# Patient Record
Sex: Female | Born: 1935 | Race: White | Hispanic: No | Smoking: Never smoker
Health system: Southern US, Community
[De-identification: ages and names within clinical notes are randomized; demographics above are authoritative.]

## PROBLEM LIST (undated history)

## (undated) DIAGNOSIS — K649 Unspecified hemorrhoids: Secondary | ICD-10-CM

## (undated) DIAGNOSIS — M810 Age-related osteoporosis without current pathological fracture: Secondary | ICD-10-CM

## (undated) DIAGNOSIS — M1990 Unspecified osteoarthritis, unspecified site: Secondary | ICD-10-CM

## (undated) DIAGNOSIS — T4145XA Adverse effect of unspecified anesthetic, initial encounter: Secondary | ICD-10-CM

## (undated) DIAGNOSIS — R609 Edema, unspecified: Secondary | ICD-10-CM

## (undated) DIAGNOSIS — R319 Hematuria, unspecified: Secondary | ICD-10-CM

## (undated) DIAGNOSIS — C50412 Malignant neoplasm of upper-outer quadrant of left female breast: Secondary | ICD-10-CM

## (undated) DIAGNOSIS — R059 Cough, unspecified: Secondary | ICD-10-CM

## (undated) DIAGNOSIS — F419 Anxiety disorder, unspecified: Secondary | ICD-10-CM

## (undated) DIAGNOSIS — M791 Myalgia, unspecified site: Secondary | ICD-10-CM

## (undated) DIAGNOSIS — K219 Gastro-esophageal reflux disease without esophagitis: Secondary | ICD-10-CM

## (undated) DIAGNOSIS — E785 Hyperlipidemia, unspecified: Secondary | ICD-10-CM

## (undated) DIAGNOSIS — R251 Tremor, unspecified: Secondary | ICD-10-CM

## (undated) DIAGNOSIS — R42 Dizziness and giddiness: Secondary | ICD-10-CM

## (undated) DIAGNOSIS — C189 Malignant neoplasm of colon, unspecified: Secondary | ICD-10-CM

## (undated) DIAGNOSIS — N63 Unspecified lump in unspecified breast: Secondary | ICD-10-CM

## (undated) DIAGNOSIS — E119 Type 2 diabetes mellitus without complications: Secondary | ICD-10-CM

## (undated) DIAGNOSIS — A419 Sepsis, unspecified organism: Secondary | ICD-10-CM

## (undated) DIAGNOSIS — T8859XA Other complications of anesthesia, initial encounter: Secondary | ICD-10-CM

## (undated) DIAGNOSIS — F410 Panic disorder [episodic paroxysmal anxiety] without agoraphobia: Secondary | ICD-10-CM

## (undated) DIAGNOSIS — C419 Malignant neoplasm of bone and articular cartilage, unspecified: Secondary | ICD-10-CM

## (undated) DIAGNOSIS — N189 Chronic kidney disease, unspecified: Secondary | ICD-10-CM

## (undated) DIAGNOSIS — Z923 Personal history of irradiation: Secondary | ICD-10-CM

## (undated) DIAGNOSIS — I10 Essential (primary) hypertension: Secondary | ICD-10-CM

## (undated) DIAGNOSIS — D689 Coagulation defect, unspecified: Secondary | ICD-10-CM

## (undated) DIAGNOSIS — Z86718 Personal history of other venous thrombosis and embolism: Secondary | ICD-10-CM

## (undated) DIAGNOSIS — C4490 Unspecified malignant neoplasm of skin, unspecified: Secondary | ICD-10-CM

## (undated) DIAGNOSIS — M542 Cervicalgia: Secondary | ICD-10-CM

## (undated) DIAGNOSIS — F32A Depression, unspecified: Secondary | ICD-10-CM

## (undated) DIAGNOSIS — IMO0001 Reserved for inherently not codable concepts without codable children: Secondary | ICD-10-CM

## (undated) DIAGNOSIS — F329 Major depressive disorder, single episode, unspecified: Secondary | ICD-10-CM

## (undated) DIAGNOSIS — R05 Cough: Secondary | ICD-10-CM

## (undated) DIAGNOSIS — R0982 Postnasal drip: Secondary | ICD-10-CM

## (undated) DIAGNOSIS — K5792 Diverticulitis of intestine, part unspecified, without perforation or abscess without bleeding: Secondary | ICD-10-CM

## (undated) HISTORY — DX: Malignant neoplasm of upper-outer quadrant of left female breast: C50.412

## (undated) HISTORY — DX: Reserved for inherently not codable concepts without codable children: IMO0001

## (undated) HISTORY — DX: Age-related osteoporosis without current pathological fracture: M81.0

## (undated) HISTORY — DX: Cervicalgia: M54.2

## (undated) HISTORY — DX: Diverticulitis of intestine, part unspecified, without perforation or abscess without bleeding: K57.92

## (undated) HISTORY — DX: Anxiety disorder, unspecified: F41.9

## (undated) HISTORY — PX: BREAST BIOPSY: SHX20

## (undated) HISTORY — DX: Sepsis, unspecified organism: A41.9

## (undated) HISTORY — DX: Essential (primary) hypertension: I10

## (undated) HISTORY — DX: Dizziness and giddiness: R42

## (undated) HISTORY — DX: Myalgia, unspecified site: M79.10

## (undated) HISTORY — DX: Coagulation defect, unspecified: D68.9

## (undated) HISTORY — DX: Edema, unspecified: R60.9

## (undated) HISTORY — DX: Panic disorder (episodic paroxysmal anxiety): F41.0

## (undated) HISTORY — PX: OTHER SURGICAL HISTORY: SHX169

## (undated) HISTORY — PX: SKIN CANCER EXCISION: SHX779

## (undated) HISTORY — DX: Depression, unspecified: F32.A

## (undated) HISTORY — DX: Hyperlipidemia, unspecified: E78.5

## (undated) HISTORY — DX: Unspecified malignant neoplasm of skin, unspecified: C44.90

## (undated) HISTORY — DX: Gastro-esophageal reflux disease without esophagitis: K21.9

## (undated) HISTORY — DX: Malignant neoplasm of colon, unspecified: C18.9

## (undated) HISTORY — DX: Unspecified hemorrhoids: K64.9

## (undated) HISTORY — DX: Unspecified lump in unspecified breast: N63.0

## (undated) HISTORY — DX: Type 2 diabetes mellitus without complications: E11.9

## (undated) HISTORY — DX: Unspecified osteoarthritis, unspecified site: M19.90

## (undated) HISTORY — PX: LEG SURGERY: SHX1003

## (undated) HISTORY — DX: Hematuria, unspecified: R31.9

## (undated) HISTORY — PX: OVARY SURGERY: SHX727

## (undated) HISTORY — PX: CARPAL TUNNEL RELEASE: SHX101

## (undated) HISTORY — PX: TONSILLECTOMY: SUR1361

## (undated) HISTORY — DX: Major depressive disorder, single episode, unspecified: F32.9

## (undated) HISTORY — PX: CHOLECYSTECTOMY: SHX55

---

## 1960-05-05 HISTORY — PX: OTHER SURGICAL HISTORY: SHX169

## 1960-05-05 HISTORY — PX: APPENDECTOMY: SHX54

## 1966-05-05 HISTORY — PX: GALLBLADDER SURGERY: SHX652

## 2007-08-17 ENCOUNTER — Ambulatory Visit: Payer: Self-pay | Admitting: Family Medicine

## 2007-10-06 ENCOUNTER — Ambulatory Visit: Payer: Self-pay | Admitting: Unknown Physician Specialty

## 2008-10-06 ENCOUNTER — Ambulatory Visit: Payer: Self-pay

## 2009-07-30 ENCOUNTER — Ambulatory Visit: Payer: Self-pay | Admitting: Internal Medicine

## 2009-08-13 ENCOUNTER — Ambulatory Visit: Payer: Self-pay | Admitting: Internal Medicine

## 2009-09-20 ENCOUNTER — Ambulatory Visit: Payer: Self-pay | Admitting: Internal Medicine

## 2010-03-15 ENCOUNTER — Ambulatory Visit: Payer: Self-pay | Admitting: Family Medicine

## 2010-05-05 HISTORY — PX: EYE SURGERY: SHX253

## 2010-07-31 ENCOUNTER — Ambulatory Visit: Payer: Self-pay | Admitting: Family Medicine

## 2010-08-07 ENCOUNTER — Ambulatory Visit: Payer: Self-pay | Admitting: Family Medicine

## 2010-11-18 ENCOUNTER — Ambulatory Visit: Payer: Self-pay | Admitting: Family Medicine

## 2011-09-23 ENCOUNTER — Ambulatory Visit: Payer: Self-pay | Admitting: Family Medicine

## 2012-10-19 ENCOUNTER — Ambulatory Visit: Payer: Self-pay | Admitting: Family Medicine

## 2013-02-21 ENCOUNTER — Ambulatory Visit (INDEPENDENT_AMBULATORY_CARE_PROVIDER_SITE_OTHER): Payer: Medicare Other | Admitting: Podiatry

## 2013-02-21 ENCOUNTER — Encounter: Payer: Self-pay | Admitting: Podiatry

## 2013-02-21 VITALS — BP 106/54 | HR 80 | Resp 16 | Ht 62.0 in | Wt 162.0 lb

## 2013-02-21 DIAGNOSIS — B351 Tinea unguium: Secondary | ICD-10-CM

## 2013-02-21 DIAGNOSIS — M79609 Pain in unspecified limb: Secondary | ICD-10-CM

## 2013-02-21 NOTE — Progress Notes (Signed)
Julie Acosta presents today as a 79 year old white female with a chief complaint of painful toenails bilaterally.  Objective: Vital signs are stable she is alert and oriented x3. Pulses are palpable. Nails are thick yellow dystrophic onychomycotic and painful palpation.  Assessment: Pain in limb secondary to onychomycosis.  Plan: Debridement of nails is a covered service 1 through 5 bilateral.

## 2013-05-23 ENCOUNTER — Ambulatory Visit (INDEPENDENT_AMBULATORY_CARE_PROVIDER_SITE_OTHER): Payer: Medicare Other | Admitting: Podiatry

## 2013-05-23 ENCOUNTER — Encounter: Payer: Self-pay | Admitting: Podiatry

## 2013-05-23 VITALS — BP 106/47 | HR 85 | Resp 16 | Ht 62.0 in | Wt 162.0 lb

## 2013-05-23 DIAGNOSIS — B351 Tinea unguium: Secondary | ICD-10-CM

## 2013-05-23 DIAGNOSIS — M79609 Pain in unspecified limb: Secondary | ICD-10-CM

## 2013-05-23 NOTE — Progress Notes (Signed)
Julie Acosta presents today with a chief complaint of painful elongated toenails to the right foot.  Objective: Vital signs are stable she is alert and oriented x3. Pain on palpation to toes one through 5 of the right foot.  Assessment: Pain in limb secondary onychomycosis 1 through 5 of the right foot.  Plan: Debridement nails 1 through 5 bilateral covered service secondary to pain.

## 2013-07-01 ENCOUNTER — Emergency Department: Payer: Self-pay | Admitting: Emergency Medicine

## 2013-07-01 LAB — BASIC METABOLIC PANEL
Anion Gap: 8 (ref 7–16)
BUN: 21 mg/dL — AB (ref 7–18)
CALCIUM: 11.2 mg/dL — AB (ref 8.5–10.1)
Chloride: 108 mmol/L — ABNORMAL HIGH (ref 98–107)
Co2: 23 mmol/L (ref 21–32)
Creatinine: 0.95 mg/dL (ref 0.60–1.30)
EGFR (African American): >60
GFR CALC NON AF AMER: 58 — AB
GLUCOSE: 150 mg/dL — AB (ref 65–99)
Osmolality: 283 (ref 275–301)
Potassium: 4.6 mmol/L (ref 3.5–5.1)
Sodium: 139 mmol/L (ref 136–145)

## 2013-07-01 LAB — HEPATIC FUNCTION PANEL A (ARMC)
ALBUMIN: 3.6 g/dL (ref 3.4–5.0)
ALK PHOS: 104 U/L
AST: 16 U/L (ref 15–37)
BILIRUBIN TOTAL: 0.2 mg/dL (ref 0.2–1.0)
Bilirubin, Direct: <0.1 mg/dL (ref 0.00–0.20)
SGPT (ALT): 19 U/L (ref 12–78)
Total Protein: 7.4 g/dL (ref 6.4–8.2)

## 2013-07-01 LAB — CBC
HCT: 39.6 % (ref 35.0–47.0)
HGB: 12.9 g/dL (ref 12.0–16.0)
MCH: 27.1 pg (ref 26.0–34.0)
MCHC: 32.5 g/dL (ref 32.0–36.0)
MCV: 83 fL (ref 80–100)
Platelet: 228 10*3/uL (ref 150–440)
RBC: 4.75 10*6/uL (ref 3.80–5.20)
RDW: 15.9 % — AB (ref 11.5–14.5)
WBC: 12.1 10*3/uL — ABNORMAL HIGH (ref 3.6–11.0)

## 2013-08-22 ENCOUNTER — Ambulatory Visit (INDEPENDENT_AMBULATORY_CARE_PROVIDER_SITE_OTHER): Payer: Medicare Other | Admitting: Podiatry

## 2013-08-22 ENCOUNTER — Encounter: Payer: Self-pay | Admitting: Podiatry

## 2013-08-22 VITALS — BP 115/57 | HR 95 | Resp 16

## 2013-08-22 DIAGNOSIS — M79609 Pain in unspecified limb: Secondary | ICD-10-CM

## 2013-08-22 DIAGNOSIS — B351 Tinea unguium: Secondary | ICD-10-CM

## 2013-08-22 NOTE — Progress Notes (Signed)
Julie Acosta presents today chief complaint of painful toenails one through 5 of the right foot. She also states that she has recently had a blood clot in her left arm and is notable at the in had shingles.  Objective: Pulses are palpable to the right foot. Nails are thick and elongated.  Assessment: Pain in limb secondary to elongated toenails.  Plan: Debridement of nails 1 through 5 of the right foot only.

## 2013-11-21 ENCOUNTER — Ambulatory Visit: Payer: Medicare Other | Admitting: Podiatry

## 2013-11-21 ENCOUNTER — Ambulatory Visit (INDEPENDENT_AMBULATORY_CARE_PROVIDER_SITE_OTHER): Payer: Medicare Other | Admitting: Podiatry

## 2013-11-21 VITALS — BP 114/56 | HR 76 | Resp 16

## 2013-11-21 DIAGNOSIS — M79676 Pain in unspecified toe(s): Secondary | ICD-10-CM

## 2013-11-21 DIAGNOSIS — B351 Tinea unguium: Secondary | ICD-10-CM

## 2013-11-21 DIAGNOSIS — M79609 Pain in unspecified limb: Secondary | ICD-10-CM

## 2013-11-21 NOTE — Progress Notes (Signed)
She presents today with a chief complaint of pain to her right foot and elongated toenails.  Objective: Pulses are palpable right foot. Nails are thick yellow and mycotic.  Assessment: Pain in limb secondary to onychomycosis.  Plan: Debridement of nails in thickness and length as cover service.

## 2014-01-03 ENCOUNTER — Ambulatory Visit: Payer: Self-pay | Admitting: Family Medicine

## 2014-02-27 ENCOUNTER — Ambulatory Visit (INDEPENDENT_AMBULATORY_CARE_PROVIDER_SITE_OTHER): Payer: Medicare Other | Admitting: Podiatry

## 2014-02-27 DIAGNOSIS — B351 Tinea unguium: Secondary | ICD-10-CM

## 2014-02-27 DIAGNOSIS — M79676 Pain in unspecified toe(s): Secondary | ICD-10-CM

## 2014-02-27 NOTE — Progress Notes (Signed)
She presents today with a chief complaint of painful elongated toenails 1 through 5 of the right foot.  Objective: Pulses remain palpable right foot. Nails are thick yellow dystrophic with mycotic and painful palpation 1 through 5 of the right foot.  Assessment: Pain limb secondary one through 5 right foot.  Plan: Debridement of nails 1 through 5 right foot. 

## 2014-05-09 ENCOUNTER — Ambulatory Visit: Payer: Self-pay

## 2014-05-31 ENCOUNTER — Ambulatory Visit (INDEPENDENT_AMBULATORY_CARE_PROVIDER_SITE_OTHER): Payer: Medicare Other | Admitting: Podiatry

## 2014-05-31 ENCOUNTER — Encounter: Payer: Self-pay | Admitting: Podiatry

## 2014-05-31 DIAGNOSIS — B351 Tinea unguium: Secondary | ICD-10-CM

## 2014-05-31 DIAGNOSIS — M79676 Pain in unspecified toe(s): Secondary | ICD-10-CM

## 2014-05-31 NOTE — Progress Notes (Signed)
She presents today with a chief complaint of painful elongated toenails 1 through 5 of the right foot.  Objective: Pulses remain palpable right foot. Nails are thick yellow dystrophic with mycotic and painful palpation 1 through 5 of the right foot.  Assessment: Pain limb secondary one through 5 right foot.  Plan: Debridement of nails 1 through 5 right foot.

## 2014-08-14 ENCOUNTER — Ambulatory Visit: Payer: Medicare Other

## 2014-08-16 ENCOUNTER — Ambulatory Visit (INDEPENDENT_AMBULATORY_CARE_PROVIDER_SITE_OTHER): Payer: Medicare Other | Admitting: Podiatry

## 2014-08-16 DIAGNOSIS — B351 Tinea unguium: Secondary | ICD-10-CM | POA: Diagnosis not present

## 2014-08-16 DIAGNOSIS — M79676 Pain in unspecified toe(s): Secondary | ICD-10-CM

## 2014-08-16 NOTE — Progress Notes (Signed)
She presents today with a chief complaint of painful elongated toenails 1 through 5 of the right foot.  Objective: Pulses remain palpable right foot. Nails are thick yellow dystrophic with mycotic and painful palpation 1 through 5 of the right foot.  Assessment: Pain limb secondary one through 5 right foot.  Plan: Debridement of nails 1 through 5 right foot.

## 2014-11-22 ENCOUNTER — Ambulatory Visit
Admission: RE | Admit: 2014-11-22 | Discharge: 2014-11-22 | Disposition: A | Discharge status: Home / Self Care | Payer: Medicare Other | Source: Ambulatory Visit | Attending: Unknown Physician Specialty | Admitting: Unknown Physician Specialty

## 2014-11-22 ENCOUNTER — Encounter
Admission: RE | Disposition: A | Discharge status: Home / Self Care | Payer: Self-pay | Source: Ambulatory Visit | Attending: Unknown Physician Specialty

## 2014-11-22 ENCOUNTER — Ambulatory Visit: Payer: Medicare Other | Admitting: Anesthesiology

## 2014-11-22 ENCOUNTER — Encounter: Payer: Self-pay | Admitting: Anesthesiology

## 2014-11-22 DIAGNOSIS — Z85038 Personal history of other malignant neoplasm of large intestine: Secondary | ICD-10-CM | POA: Insufficient documentation

## 2014-11-22 DIAGNOSIS — Z85828 Personal history of other malignant neoplasm of skin: Secondary | ICD-10-CM | POA: Diagnosis not present

## 2014-11-22 DIAGNOSIS — K640 First degree hemorrhoids: Secondary | ICD-10-CM | POA: Insufficient documentation

## 2014-11-22 DIAGNOSIS — I10 Essential (primary) hypertension: Secondary | ICD-10-CM | POA: Diagnosis not present

## 2014-11-22 DIAGNOSIS — D689 Coagulation defect, unspecified: Secondary | ICD-10-CM | POA: Insufficient documentation

## 2014-11-22 DIAGNOSIS — Z89612 Acquired absence of left leg above knee: Secondary | ICD-10-CM | POA: Insufficient documentation

## 2014-11-22 DIAGNOSIS — D120 Benign neoplasm of cecum: Secondary | ICD-10-CM | POA: Insufficient documentation

## 2014-11-22 DIAGNOSIS — Z885 Allergy status to narcotic agent status: Secondary | ICD-10-CM | POA: Diagnosis not present

## 2014-11-22 DIAGNOSIS — K219 Gastro-esophageal reflux disease without esophagitis: Secondary | ICD-10-CM | POA: Insufficient documentation

## 2014-11-22 DIAGNOSIS — K5730 Diverticulosis of large intestine without perforation or abscess without bleeding: Secondary | ICD-10-CM | POA: Insufficient documentation

## 2014-11-22 DIAGNOSIS — K2970 Gastritis, unspecified, without bleeding: Secondary | ICD-10-CM | POA: Diagnosis not present

## 2014-11-22 DIAGNOSIS — K317 Polyp of stomach and duodenum: Secondary | ICD-10-CM | POA: Diagnosis not present

## 2014-11-22 DIAGNOSIS — R1310 Dysphagia, unspecified: Secondary | ICD-10-CM | POA: Diagnosis present

## 2014-11-22 DIAGNOSIS — F419 Anxiety disorder, unspecified: Secondary | ICD-10-CM | POA: Insufficient documentation

## 2014-11-22 DIAGNOSIS — E119 Type 2 diabetes mellitus without complications: Secondary | ICD-10-CM | POA: Insufficient documentation

## 2014-11-22 DIAGNOSIS — F329 Major depressive disorder, single episode, unspecified: Secondary | ICD-10-CM | POA: Insufficient documentation

## 2014-11-22 DIAGNOSIS — Z79899 Other long term (current) drug therapy: Secondary | ICD-10-CM | POA: Insufficient documentation

## 2014-11-22 DIAGNOSIS — Z9049 Acquired absence of other specified parts of digestive tract: Secondary | ICD-10-CM | POA: Insufficient documentation

## 2014-11-22 DIAGNOSIS — M1990 Unspecified osteoarthritis, unspecified site: Secondary | ICD-10-CM | POA: Insufficient documentation

## 2014-11-22 HISTORY — PX: SAVORY DILATION: SHX5439

## 2014-11-22 HISTORY — DX: Gastro-esophageal reflux disease without esophagitis: K21.9

## 2014-11-22 HISTORY — PX: ESOPHAGOGASTRODUODENOSCOPY: SHX5428

## 2014-11-22 HISTORY — PX: COLONOSCOPY WITH PROPOFOL: SHX5780

## 2014-11-22 LAB — GLUCOSE, CAPILLARY: Glucose-Capillary: 164 mg/dL — ABNORMAL HIGH (ref 65–99)

## 2014-11-22 SURGERY — COLONOSCOPY WITH PROPOFOL
Anesthesia: General

## 2014-11-22 MED ORDER — SODIUM CHLORIDE 0.9 % IV SOLN: INTRAVENOUS | Status: DC

## 2014-11-22 MED ORDER — PROPOFOL INFUSION 10 MG/ML OPTIME
INTRAVENOUS | Status: DC | PRN
  Administered 2014-11-22: 100 ug/kg/min via INTRAVENOUS

## 2014-11-22 MED ORDER — FENTANYL CITRATE (PF) 100 MCG/2ML IJ SOLN
INTRAMUSCULAR | Status: DC | PRN
  Administered 2014-11-22: 50 ug via INTRAVENOUS

## 2014-11-22 MED ORDER — PROPOFOL 10 MG/ML IV BOLUS
INTRAVENOUS | Status: DC | PRN
  Administered 2014-11-22: 10 mg via INTRAVENOUS
  Administered 2014-11-22: 30 mg via INTRAVENOUS

## 2014-11-22 MED ORDER — SODIUM CHLORIDE 0.9 % IV SOLN
INTRAVENOUS | Status: DC
  Administered 2014-11-22: 1000 mL via INTRAVENOUS

## 2014-11-22 MED ORDER — LIDOCAINE HCL (PF) 2 % IJ SOLN
INTRAMUSCULAR | Status: DC | PRN
  Administered 2014-11-22: 60 mg

## 2014-11-22 NOTE — Anesthesia Postprocedure Evaluation (Signed)
  Anesthesia Post-op Note  Patient: Julie Acosta  Procedure(s) Performed: Procedure(s): COLONOSCOPY WITH PROPOFOL (N/A) ESOPHAGOGASTRODUODENOSCOPY (EGD) SAVORY DILATION  Anesthesia type:General  Patient location: PACU  Post pain: Pain level controlled  Post assessment: Post-op Vital signs reviewed, Patient's Cardiovascular Status Stable, Respiratory Function Stable, Patent Airway and No signs of Nausea or vomiting  Post vital signs: Reviewed and stable  Last Vitals:  Filed Vitals:   11/22/14 0950  BP: 117/51  Pulse: 73  Temp:   Resp: 21    Level of consciousness: awake, alert  and patient cooperative  Complications: No apparent anesthesia complications

## 2014-11-22 NOTE — Op Note (Signed)
[facility] Gastroenterology Patient Name: Julie Acosta Procedure Date: 11/22/2014 8:43 AM MRN: [id number] Account #: [id number] Date of Birth: [birth year] Admit Type: Outpatient Age: 81 Room: [facility] ENDO ROOM 4 Gender: Female Note Status: Finalized Procedure:         Upper GI endoscopy Indications:       Dysphagia Providers:         Manya Silvas, MD Referring MD:      Irven Easterly. Kary Kos, MD (Referring MD) Medicines:         Propofol per Anesthesia Complications:     No immediate complications. Procedure:         Pre-Anesthesia Assessment:                    - After reviewing the risks and benefits, the patient was                     deemed in satisfactory condition to undergo the procedure.                    After obtaining informed consent, the endoscope was passed                     under direct vision. Throughout the procedure, the                     patient's blood pressure, pulse, and oxygen saturations                     were monitored continuously. The Olympus GIF-160 endoscope                     (S#. [id number]) was introduced through the mouth, and                     advanced to the second part of duodenum. The upper GI                     endoscopy was accomplished without difficulty. The patient                     tolerated the procedure well. Findings:      The examined esophagus was normal. AT the endd of the procedure a       guidewire was placed and the scope was withdrawn. Dilation was performed       with a Savary dilator with mild resistance at 16 mm.      A single small-medium flat/slightly raised sessile polyp-like prominence       with no bleeding and no stigmata of recent bleeding was found on the       greater curvature of the gastric antrum. Biopsies were taken with a cold       forceps for histology.      Diffuse mild inflammation characterized by granularity was found in the       gastric body.      The examined duodenum was  normal. Impression:        - Normal esophagus. Dilated.                    - A single gastric polyp. Biopsied.                    - Gastritis.                    -  Normal examined duodenum. Recommendation:    - Await pathology results. Manya Silvas, MD 11/22/2014 8:59:23 AM This report has been signed electronically. Number of Addenda: 0 Note Initiated On: 11/22/2014 8:43 AM      [facility]

## 2014-11-22 NOTE — H&P (Signed)
Primary Care Physician:  Maryland Pink, MD Primary Gastroenterologist:  Dr. Vira Agar  Pre-Procedure History & Physical: HPI:  Julie Acosta is a 81 y.o. female is here for an endoscopy and colonoscopy.   Past Medical History  Diagnosis Date  . Dizzy   . Muscle pain   . Osteoarthritis   . Swelling   . Diabetes   . Blood clotting disorder   . HBP (high blood pressure)   . Reflux   . Depression   . Anxiety   . Skin cancer   . GERD (gastroesophageal reflux disease)     Past Surgical History  Procedure Laterality Date  . Leg surgery Left     AMPUTATION  . Gallbladder surgery    . Breast surgery Right   . Hemipelvic Left   . Appendectomy  1962  . Ovary surgery Right   . Carpal tunnel release Right   . Skin cancer excision    . Cholecystectomy    . Tonsillectomy      Prior to Admission medications   Medication Sig Start Date End Date Taking? Authorizing Provider  metoprolol succinate (TOPROL-XL) 25 MG 24 hr tablet  04/18/13  Yes Historical Provider, MD  Vitamin D, Ergocalciferol, (DRISDOL) 50000 UNITS CAPS capsule Take 50,000 Units by mouth as directed.   Yes Historical Provider, MD  acetaminophen (TYLENOL) 500 MG tablet Take 500 mg by mouth every 6 (six) hours as needed.    Historical Provider, MD  aspirin 325 MG tablet Take 325 mg by mouth daily.    Historical Provider, MD  baclofen (LIORESAL) 10 MG tablet  07/06/13   Historical Provider, MD  Cholecalciferol (VITAMIN D PO) Take by mouth. Take one tablet two times weekly    Historical Provider, MD  fenofibrate 160 MG tablet  05/09/13   Historical Provider, MD  GLIPIZIDE XL 2.5 MG 24 hr tablet  05/09/13   Historical Provider, MD  losartan (COZAAR) 50 MG tablet  03/29/13   Historical Provider, MD  metFORMIN (GLUCOPHAGE) 500 MG tablet  03/15/13   Historical Provider, MD  omeprazole (PRILOSEC) 20 MG capsule Take 20 mg by mouth daily.    Historical Provider, MD  PARoxetine (PAXIL) 40 MG tablet  02/28/13   Historical Provider,  MD  raloxifene (EVISTA) 60 MG tablet  05/02/13   Historical Provider, MD  valACYclovir (VALTREX) 1000 MG tablet  08/16/13   Historical Provider, MD  XARELTO 20 MG TABS tablet  07/04/13   Historical Provider, MD    Allergies as of 10/25/2014 - Review Complete 08/16/2014  Allergen Reaction Noted  . Codeine Other (See Comments) 02/21/2013    History reviewed. No pertinent family history.  History   Social History  . Marital Status: Divorced    Spouse Name: N/A  . Number of Children: N/A  . Years of Education: N/A   Occupational History  . Not on file.   Social History Main Topics  . Smoking status: Never Smoker   . Smokeless tobacco: Never Used  . Alcohol Use: No  . Drug Use: No  . Sexual Activity: Not on file   Other Topics Concern  . Not on file   Social History Narrative    Review of Systems: See HPI, otherwise negative ROS  Physical Exam: BP 125/55 mmHg  Pulse 78  Temp(Src) 98 F (36.7 C) (Tympanic)  Resp 18  Ht 5' (1.524 m)  Wt 73.936 kg (163 lb)  BMI 31.83 kg/m2  SpO2 96% General:   Alert,  pleasant  and cooperative in NAD Head:  Normocephalic and atraumatic. Neck:  Supple; no masses or thyromegaly. Lungs:  Clear throughout to auscultation.    Heart:  Regular rate and rhythm. Abdomen:  Soft, nontender and nondistended. Normal bowel sounds, without guarding, and without rebound.   Neurologic:  Alert and  oriented x4;  grossly normal neurologically.  Impression/Plan: Julie Acosta is here for an endoscopy and colonoscopy to be performed for dysphagia and personal history of colon cancer  Risks, benefits, limitations, and alternatives regarding  endoscopy and colonoscopy have been reviewed with the patient.  Questions have been answered.  All parties agreeable.   [name], MD  11/22/2014, 8:39 AM   Primary Care Physician:  Maryland Pink, MD Primary Gastroenterologist:  Dr. Vira Agar  Pre-Procedure History & Physical: HPI:  Julie Acosta is a  81 y.o. female is here for an endoscopy and colonoscopy.   Past Medical History  Diagnosis Date  . Dizzy   . Muscle pain   . Osteoarthritis   . Swelling   . Diabetes   . Blood clotting disorder   . HBP (high blood pressure)   . Reflux   . Depression   . Anxiety   . Skin cancer   . GERD (gastroesophageal reflux disease)     Past Surgical History  Procedure Laterality Date  . Leg surgery Left     AMPUTATION  . Gallbladder surgery    . Breast surgery Right   . Hemipelvic Left   . Appendectomy  1962  . Ovary surgery Right   . Carpal tunnel release Right   . Skin cancer excision    . Cholecystectomy    . Tonsillectomy      Prior to Admission medications   Medication Sig Start Date End Date Taking? Authorizing Provider  metoprolol succinate (TOPROL-XL) 25 MG 24 hr tablet  04/18/13  Yes Historical Provider, MD  Vitamin D, Ergocalciferol, (DRISDOL) 50000 UNITS CAPS capsule Take 50,000 Units by mouth as directed.   Yes Historical Provider, MD  acetaminophen (TYLENOL) 500 MG tablet Take 500 mg by mouth every 6 (six) hours as needed.    Historical Provider, MD  aspirin 325 MG tablet Take 325 mg by mouth daily.    Historical Provider, MD  baclofen (LIORESAL) 10 MG tablet  07/06/13   Historical Provider, MD  Cholecalciferol (VITAMIN D PO) Take by mouth. Take one tablet two times weekly    Historical Provider, MD  fenofibrate 160 MG tablet  05/09/13   Historical Provider, MD  GLIPIZIDE XL 2.5 MG 24 hr tablet  05/09/13   Historical Provider, MD  losartan (COZAAR) 50 MG tablet  03/29/13   Historical Provider, MD  metFORMIN (GLUCOPHAGE) 500 MG tablet  03/15/13   Historical Provider, MD  omeprazole (PRILOSEC) 20 MG capsule Take 20 mg by mouth daily.    Historical Provider, MD  PARoxetine (PAXIL) 40 MG tablet  02/28/13   Historical Provider, MD  raloxifene (EVISTA) 60 MG tablet  05/02/13   Historical Provider, MD  valACYclovir (VALTREX) 1000 MG tablet  08/16/13   Historical Provider, MD   XARELTO 20 MG TABS tablet  07/04/13   Historical Provider, MD    Allergies as of 10/25/2014 - Review Complete 08/16/2014  Allergen Reaction Noted  . Codeine Other (See Comments) 02/21/2013    History reviewed. No pertinent family history.  History   Social History  . Marital Status: Divorced    Spouse Name: N/A  . Number of Children: N/A  .  Years of Education: N/A   Occupational History  . Not on file.   Social History Main Topics  . Smoking status: Never Smoker   . Smokeless tobacco: Never Used  . Alcohol Use: No  . Drug Use: No  . Sexual Activity: Not on file   Other Topics Concern  . Not on file   Social History Narrative    Review of Systems: See HPI, otherwise negative ROS  Physical Exam: BP 125/55 mmHg  Pulse 78  Temp(Src) 98 F (36.7 C) (Tympanic)  Resp 18  Ht 5' (1.524 m)  Wt 73.936 kg (163 lb)  BMI 31.83 kg/m2  SpO2 96% General:   Alert,  pleasant and cooperative in NAD Head:  Normocephalic and atraumatic. Neck:  Supple; no masses or thyromegaly. Lungs:  Clear throughout to auscultation.    Heart:  Regular rate and rhythm. Abdomen:  Soft, nontender and nondistended. Normal bowel sounds, without guarding, and without rebound.   Neurologic:  Alert and  oriented x4;  grossly normal neurologically.  Impression/Plan: Julie Acosta is here for an endoscopy and colonoscopy to be performed for personal history of colon cancer and dysphagia  Risks, benefits, limitations, and alternatives regarding  endoscopy and colonoscopy have been reviewed with the patient.  Questions have been answered.  All parties agreeable.   [name], MD  11/22/2014, 8:39 AM

## 2014-11-22 NOTE — Transfer of Care (Signed)
Immediate Anesthesia Transfer of Care Note  Patient: Julie Acosta  Procedure(s) Performed: Procedure(s): COLONOSCOPY WITH PROPOFOL (N/A) ESOPHAGOGASTRODUODENOSCOPY (EGD)  Patient Location: PACU  Anesthesia Type:General  Level of Consciousness: sedated  Airway & Oxygen Therapy: Patient Spontanous Breathing and Patient connected to nasal cannula oxygen  Post-op Assessment: Report given to RN and Post -op Vital signs reviewed and stable  Post vital signs: Reviewed and stable  Last Vitals:  Filed Vitals:   11/22/14 0817  BP: 125/55  Pulse: 78  Temp: 36.7 C  Resp: 18    Complications: No apparent anesthesia complications

## 2014-11-22 NOTE — Op Note (Signed)
[facility] Gastroenterology Patient Name: Julie Acosta Procedure Date: 11/22/2014 8:42 AM MRN: [id number] Account #: [id number] Date of Birth: 14-Oct-1933 Admit Type: Outpatient Age: 81 Room: [facility] ENDO ROOM 4 Gender: Female Note Status: Finalized Procedure:         Colonoscopy Indications:       Personal history of malignant neoplasm of the colon Providers:         Manya Silvas, MD Referring MD:      Irven Easterly. Kary Kos, MD (Referring MD) Medicines:         Propofol per Anesthesia Complications:     No immediate complications. Procedure:         Pre-Anesthesia Assessment:                    - After reviewing the risks and benefits, the patient was                     deemed in satisfactory condition to undergo the procedure.                    After obtaining informed consent, the colonoscope was                     passed under direct vision. Throughout the procedure, the                     patient's blood pressure, pulse, and oxygen saturations                     were monitored continuously. The Olympus PCF-H180AL                     colonoscope ( S#: [id number] ) was introduced through the                     anus and advanced to the the cecum, identified by                     appendiceal orifice and ileocecal valve. The colonoscopy                     was performed without difficulty. The patient tolerated                     the procedure well. The quality of the bowel preparation                     was good. Findings:      A diminutive polyp was found in the cecum. The polyp was sessile. The       polyp was removed with a jumbo cold forceps. Resection and retrieval       were complete.      Multiple small-mouthed diverticula were found in the sigmoid colon and       in the descending colon.      Internal hemorrhoids were found during endoscopy. The hemorrhoids were       medium-sized and Grade I (internal hemorrhoids that do not prolapse).      The  exam was otherwise without abnormality. Impression:        - One diminutive polyp in the cecum. Resected and                     retrieved.                    -  Diverticulosis in the sigmoid colon and in the                     descending colon.                    - Internal hemorrhoids.                    - The examination was otherwise normal. Recommendation:    - Await pathology results. Manya Silvas, MD 11/22/2014 9:17:47 AM This report has been signed electronically. Number of Addenda: 0 Note Initiated On: 11/22/2014 8:42 AM Scope Withdrawal Time: 0 hours 7 minutes 24 seconds  Total Procedure Duration: 0 hours 14 minutes 35 seconds       [facility]

## 2014-11-22 NOTE — Anesthesia Preprocedure Evaluation (Signed)
Anesthesia Evaluation  Patient identified by MRN, date of birth, ID band Patient awake    Reviewed: Allergy & Precautions, NPO status , Patient's Chart, lab work & pertinent test results, reviewed documented beta blocker date and time   Airway Mallampati: II  TM Distance: >3 FB     Dental  (+) Chipped   Pulmonary          Cardiovascular hypertension,     Neuro/Psych    GI/Hepatic   Endo/Other  diabetes, Type 2  Renal/GU      Musculoskeletal  (+) Arthritis -,   Abdominal   Peds  Hematology   Anesthesia Other Findings Blood clotting disorder.  Reproductive/Obstetrics                             Anesthesia Physical Anesthesia Plan  ASA: III  Anesthesia Plan: General   Post-op Pain Management:    Induction: Intravenous  Airway Management Planned: Nasal Cannula  Additional Equipment:   Intra-op Plan:   Post-operative Plan:   Informed Consent: I have reviewed the patients History and Physical, chart, labs and discussed the procedure including the risks, benefits and alternatives for the proposed anesthesia with the patient or authorized representative who has indicated his/her understanding and acceptance.     Plan Discussed with: CRNA  Anesthesia Plan Comments:         Anesthesia Quick Evaluation

## 2014-11-24 LAB — SURGICAL PATHOLOGY

## 2014-11-27 ENCOUNTER — Encounter: Payer: Self-pay | Admitting: Unknown Physician Specialty

## 2014-12-04 ENCOUNTER — Ambulatory Visit (INDEPENDENT_AMBULATORY_CARE_PROVIDER_SITE_OTHER): Payer: Medicare Other | Admitting: Podiatry

## 2014-12-04 DIAGNOSIS — M79676 Pain in unspecified toe(s): Secondary | ICD-10-CM

## 2014-12-04 DIAGNOSIS — B351 Tinea unguium: Secondary | ICD-10-CM

## 2014-12-04 NOTE — Progress Notes (Signed)
She presents today with a chief complaint of painful elongated toenails 1 through 5 of the right foot.  Objective: Pulses remain palpable right foot. Nails are thick yellow dystrophic with mycotic and painful palpation 1 through 5 of the right foot.  Assessment: Pain limb secondary one through 5 right foot.  Plan: Debridement of nails 1 through 5 right foot.

## 2015-01-24 ENCOUNTER — Other Ambulatory Visit: Payer: Self-pay | Admitting: Family Medicine

## 2015-01-24 DIAGNOSIS — Z1231 Encounter for screening mammogram for malignant neoplasm of breast: Secondary | ICD-10-CM

## 2015-01-30 ENCOUNTER — Ambulatory Visit
Admission: RE | Admit: 2015-01-30 | Discharge: 2015-01-30 | Disposition: A | Discharge status: Home / Self Care | Payer: Medicare Other | Source: Ambulatory Visit | Attending: Family Medicine | Admitting: Family Medicine

## 2015-01-30 ENCOUNTER — Other Ambulatory Visit: Payer: Self-pay | Admitting: Family Medicine

## 2015-01-30 DIAGNOSIS — Z1231 Encounter for screening mammogram for malignant neoplasm of breast: Secondary | ICD-10-CM | POA: Insufficient documentation

## 2015-01-30 HISTORY — DX: Malignant neoplasm of bone and articular cartilage, unspecified: C41.9

## 2015-03-07 ENCOUNTER — Ambulatory Visit: Payer: Medicare Other

## 2015-03-07 ENCOUNTER — Encounter: Payer: Self-pay | Admitting: Podiatry

## 2015-03-07 ENCOUNTER — Ambulatory Visit (INDEPENDENT_AMBULATORY_CARE_PROVIDER_SITE_OTHER): Payer: Medicare Other | Admitting: Podiatry

## 2015-03-07 DIAGNOSIS — M79671 Pain in right foot: Secondary | ICD-10-CM | POA: Diagnosis not present

## 2015-03-07 DIAGNOSIS — B351 Tinea unguium: Secondary | ICD-10-CM

## 2015-03-07 NOTE — Progress Notes (Signed)
She presents today for follow-up of her toenails she states that the toenails on her right foot are extremely painful.  Objective: Vital signs are stable she's alert and enzymes 3 pulses are palpable right lower extremity. Nails are thick yellow dystrophic clinic mycotic 1 through 5 right lower extremity.  Assessment: Pain in limb secondary to onychomycosis right foot.  Plan: Debridement of toenails 1 through 5 right foot. Follow up with her in 3 months.  Roselind Messier DPM

## 2015-05-17 ENCOUNTER — Telehealth: Payer: Self-pay | Admitting: Urology

## 2015-05-17 NOTE — Telephone Encounter (Signed)
She does need her urine looked at microscopically on a yearly basis to make sure she does not have blood in her urine.  We can do that or her PCP can do that.

## 2015-05-17 NOTE — Telephone Encounter (Signed)
Pt called to cancel appt on 1/17, says she's feeling fine, not having any problems and will call if she needs [place].  Just F.Y.I.

## 2015-05-21 ENCOUNTER — Other Ambulatory Visit: Payer: Self-pay | Admitting: Family Medicine

## 2015-05-21 DIAGNOSIS — R1032 Left lower quadrant pain: Secondary | ICD-10-CM

## 2015-05-21 NOTE — Telephone Encounter (Signed)
Spoke with pt in reference to cancelling appt. Made pt aware she needs a micro u/a yearly and [facility] or PCP can do this. Pt stated that she just saw her PCP and that was not done. Therefore pt will call back tomorrow to make a f/u appt with [facility].

## 2015-05-22 ENCOUNTER — Ambulatory Visit: Payer: Self-pay | Admitting: Urology

## 2015-05-24 ENCOUNTER — Other Ambulatory Visit: Payer: Self-pay | Admitting: Family Medicine

## 2015-05-24 DIAGNOSIS — R1032 Left lower quadrant pain: Secondary | ICD-10-CM

## 2015-05-24 DIAGNOSIS — R1084 Generalized abdominal pain: Secondary | ICD-10-CM

## 2015-05-25 ENCOUNTER — Ambulatory Visit: Admission: RE | Admit: 2015-05-25 | Payer: Medicare Other | Source: Ambulatory Visit

## 2015-05-25 ENCOUNTER — Encounter: Payer: Self-pay | Admitting: *Deleted

## 2015-05-25 ENCOUNTER — Ambulatory Visit: Payer: Medicare Other

## 2015-05-28 ENCOUNTER — Ambulatory Visit
Admission: RE | Admit: 2015-05-28 | Discharge: 2015-05-28 | Disposition: A | Discharge status: Home / Self Care | Payer: Medicare Other | Source: Ambulatory Visit | Attending: Family Medicine | Admitting: Family Medicine

## 2015-05-28 DIAGNOSIS — R1084 Generalized abdominal pain: Secondary | ICD-10-CM

## 2015-05-28 DIAGNOSIS — Z9049 Acquired absence of other specified parts of digestive tract: Secondary | ICD-10-CM | POA: Diagnosis not present

## 2015-05-28 DIAGNOSIS — Z87442 Personal history of urinary calculi: Secondary | ICD-10-CM | POA: Insufficient documentation

## 2015-05-28 DIAGNOSIS — R1032 Left lower quadrant pain: Secondary | ICD-10-CM

## 2015-05-30 ENCOUNTER — Encounter: Payer: Self-pay | Admitting: Urology

## 2015-05-30 ENCOUNTER — Ambulatory Visit (INDEPENDENT_AMBULATORY_CARE_PROVIDER_SITE_OTHER): Payer: Medicare Other | Admitting: Urology

## 2015-05-30 VITALS — Ht 60.0 in | Wt 160.0 lb

## 2015-05-30 DIAGNOSIS — F32A Depression, unspecified: Secondary | ICD-10-CM | POA: Insufficient documentation

## 2015-05-30 DIAGNOSIS — F410 Panic disorder [episodic paroxysmal anxiety] without agoraphobia: Secondary | ICD-10-CM | POA: Insufficient documentation

## 2015-05-30 DIAGNOSIS — E785 Hyperlipidemia, unspecified: Secondary | ICD-10-CM

## 2015-05-30 DIAGNOSIS — M810 Age-related osteoporosis without current pathological fracture: Secondary | ICD-10-CM | POA: Insufficient documentation

## 2015-05-30 DIAGNOSIS — E119 Type 2 diabetes mellitus without complications: Secondary | ICD-10-CM | POA: Insufficient documentation

## 2015-05-30 DIAGNOSIS — I10 Essential (primary) hypertension: Secondary | ICD-10-CM | POA: Insufficient documentation

## 2015-05-30 DIAGNOSIS — R3129 Other microscopic hematuria: Secondary | ICD-10-CM

## 2015-05-30 DIAGNOSIS — F339 Major depressive disorder, recurrent, unspecified: Secondary | ICD-10-CM | POA: Insufficient documentation

## 2015-05-30 DIAGNOSIS — C801 Malignant (primary) neoplasm, unspecified: Secondary | ICD-10-CM

## 2015-05-30 DIAGNOSIS — E1169 Type 2 diabetes mellitus with other specified complication: Secondary | ICD-10-CM | POA: Insufficient documentation

## 2015-05-30 DIAGNOSIS — F329 Major depressive disorder, single episode, unspecified: Secondary | ICD-10-CM | POA: Insufficient documentation

## 2015-05-30 DIAGNOSIS — N63 Unspecified lump in unspecified breast: Secondary | ICD-10-CM | POA: Insufficient documentation

## 2015-05-30 DIAGNOSIS — E1142 Type 2 diabetes mellitus with diabetic polyneuropathy: Secondary | ICD-10-CM | POA: Insufficient documentation

## 2015-05-30 HISTORY — DX: Hyperlipidemia, unspecified: E78.5

## 2015-05-30 HISTORY — DX: Malignant (primary) neoplasm, unspecified: C80.1

## 2015-05-30 LAB — URINALYSIS, COMPLETE
Bilirubin, UA: NEGATIVE
GLUCOSE, UA: NEGATIVE
Leukocytes, UA: NEGATIVE
Nitrite, UA: NEGATIVE
Protein, UA: NEGATIVE
Specific Gravity, UA: 1.025 (ref 1.005–1.030)
UUROB: 0.2 mg/dL (ref 0.2–1.0)
pH, UA: 5 (ref 5.0–7.5)

## 2015-05-30 LAB — MICROSCOPIC EXAMINATION: BACTERIA UA: NONE SEEN

## 2015-05-30 NOTE — Progress Notes (Signed)
05/30/2015 9:55 PM   Julie Acosta 10/06/1933 BP:[id number]  Referring provider: Maryland Pink, MD [address] [facility] [place], [place] [address]  Chief Complaint  Patient presents with  . Hematuria    recheck    HPI: Patient is 82 year old Caucasian female who presents today for hematuria recheck.  Patient underwent a hematuria workup 1 year ago with CT urogram and cystoscopically with Dr. Rick Duff. She is found to have bilateral renal cysts and small bilateral renal calculi.  She does not report any gross hematuria. She is not experiencing dysuria or suprapubic pain. She does experience nocturia, but this is stable.  She has not experienced any urinary tract infections.  Her UA today demonstrated 3-10 RBCs per high-power Acosta.       PMH: Past Medical History  Diagnosis Date  . Dizzy   . Muscle pain   . Osteoarthritis   . Swelling   . Diabetes ([place])   . Blood clotting disorder ([place])   . HBP (high blood pressure)   . Reflux   . Depression   . Anxiety   . GERD (gastroesophageal reflux disease)   . Skin cancer   . Bone cancer ([place])   . Benign breast lumps   . Hemorrhoids   . Diverticulitis   . HLD (hyperlipidemia)   . Colon cancer ([place])   . Hematuria     gross  . Osteoporosis   . Panic disorder   . Cervicalgia     Surgical History: Past Surgical History  Procedure Laterality Date  . Leg surgery Left     AMPUTATION  . Gallbladder surgery    . Breast surgery Right   . Hemipelvic Left   . Appendectomy  1962  . Ovary surgery Right   . Carpal tunnel release Right   . Skin cancer excision    . Cholecystectomy    . Tonsillectomy    . Colonoscopy with propofol N/A 11/22/2014    Procedure: COLONOSCOPY WITH PROPOFOL;  Surgeon: Manya Silvas, MD;  Location: [facility] ENDOSCOPY;  Service: Endoscopy;  Laterality: N/A;  . Esophagogastroduodenoscopy  11/22/2014    Procedure: ESOPHAGOGASTRODUODENOSCOPY (EGD);  Surgeon: Manya Silvas, MD;   Location: [place];  Service: Endoscopy;;  . Azzie Almas dilation  11/22/2014    Procedure: Azzie Almas DILATION;  Surgeon: Manya Silvas, MD;  Location: [facility] ENDOSCOPY;  Service: Endoscopy;;  . Breast biopsy Right 2006?    benign    Home Medications:    Medication List       This list is accurate as of: 05/30/15 11:59 PM.  Always use your most recent med list.               acetaminophen 500 MG tablet  Commonly known as:  TYLENOL  Take 500 mg by mouth every 6 (six) hours as needed.     baclofen 10 MG tablet  Commonly known as:  LIORESAL     fenofibrate 160 MG tablet     GLIPIZIDE XL 2.5 MG 24 hr tablet  Generic drug:  glipiZIDE     losartan 50 MG tablet  Commonly known as:  COZAAR     metFORMIN 500 MG tablet  Commonly known as:  GLUCOPHAGE     metoprolol succinate 25 MG 24 hr tablet  Commonly known as:  TOPROL-XL     omeprazole 40 MG capsule  Commonly known as:  PRILOSEC     PARoxetine 40 MG tablet  Commonly known as:  PAXIL  raloxifene 60 MG tablet  Commonly known as:  EVISTA     valACYclovir 1000 MG tablet  Commonly known as:  VALTREX  Reported on 05/30/2015     Vitamin D (Ergocalciferol) 50000 units Caps capsule  Commonly known as:  DRISDOL  Take 50,000 Units by mouth as directed.     VITAMIN D PO  Take by mouth. Take one tablet two times weekly     XARELTO 20 MG Tabs tablet  Generic drug:  rivaroxaban        Allergies:  Allergies  Allergen Reactions  . Amoxicillin   . Ciprocinonide [Fluocinolone]   . Codeine Other (See Comments)    HALLUCINATIONS  . Latex   . Lipitor [Atorvastatin]     Family History: Family History  Problem Relation Age of Onset  . Breast cancer Paternal Aunt   . Ovarian cancer Sister   . Prostate cancer Father   . Stroke Mother     Social History:  reports that she has never smoked. She has never used smokeless tobacco. She reports that she does not drink alcohol or use illicit  drugs.  ROS: UROLOGY Frequent Urination?: No Hard to postpone urination?: No Burning/pain with urination?: No Get up at night to urinate?: Yes Leakage of urine?: No Urine stream starts and stops?: No Trouble starting stream?: No Do you have to strain to urinate?: No Blood in urine?: No Urinary tract infection?: No Sexually transmitted disease?: No Injury to kidneys or bladder?: No Painful intercourse?: No Weak stream?: No Currently pregnant?: No Vaginal bleeding?: No Last menstrual period?: n  Gastrointestinal Nausea?: No Vomiting?: No Indigestion/heartburn?: No Diarrhea?: Yes Constipation?: No  Constitutional Fever: No Night sweats?: No Weight loss?: No Fatigue?: No  Skin Skin rash/lesions?: No Itching?: Yes  Eyes Blurred vision?: No Double vision?: No  Ears/Nose/Throat Sore throat?: No Sinus problems?: No  Hematologic/Lymphatic Swollen glands?: No Easy bruising?: No  Cardiovascular Leg swelling?: No Chest pain?: No  Respiratory Cough?: No Shortness of breath?: No  Endocrine Excessive thirst?: No  Musculoskeletal Back pain?: Yes Joint pain?: Yes  Neurological Headaches?: No Dizziness?: No  Psychologic Depression?: No Anxiety?: No  Physical Exam: Ht 5' (1.524 m)  Wt 160 lb (72.576 kg)  BMI 31.25 kg/m2  Constitutional: Well nourished. Alert and oriented, No acute distress. HEENT:  AT, moist mucus membranes. Trachea midline, no masses. Cardiovascular: No clubbing, cyanosis, or edema. Respiratory: Normal respiratory effort, no increased work of breathing. GI: Abdomen is soft, non tender, non distended, no abdominal masses. Liver and spleen not palpable.  No hernias appreciated.  Stool sample for occult testing is not indicated.   GU: No CVA tenderness.  No bladder fullness or masses.  Atrophic external genitalia, normal pubic hair distribution, no lesions.  Normal urethral meatus, no lesions, no prolapse, no discharge.   No urethral  masses, tenderness and/or tenderness. No bladder fullness, tenderness or masses. Normal vagina mucosa, good estrogen effect, no discharge, no lesions, good pelvic support, no cystocele or rectocele noted.  No cervical motion tenderness.  Uterus is freely mobile and non-fixed.  No adnexal/parametria masses or tenderness noted.  Anus and perineum are without rashes or lesions.    Skin: No rashes, bruises or suspicious lesions. Lymph: No cervical or inguinal adenopathy. Neurologic: Grossly intact, no focal deficits, moving all 3 extremities. Psychiatric: Normal mood and affect.  Laboratory Data: Lab Results  Component Value Date   WBC 12.1* 07/01/2013   HGB 12.9 07/01/2013   HCT 39.6 07/01/2013   MCV 83  07/01/2013   PLT 228 07/01/2013    Lab Results  Component Value Date   CREATININE 0.95 07/01/2013    Lab Results  Component Value Date   AST 16 07/01/2013   Lab Results  Component Value Date   ALT 19 07/01/2013     Urinalysis Results for orders placed or performed in visit on 05/30/15  Microscopic Examination  Result Value Ref Range   WBC, UA 0-5 0 -  5 /hpf   RBC, UA 3-10 (A) 0 -  2 /hpf   Epithelial Cells (non renal) 0-10 0 - 10 /hpf   Mucus, UA Present (A) Not Estab.   Bacteria, UA None seen None seen/Few  Urinalysis, Complete  Result Value Ref Range   Specific Gravity, UA 1.025 1.005 - 1.030   pH, UA 5.0 5.0 - 7.5   Color, UA Yellow Yellow   Appearance Ur Clear Clear   Leukocytes, UA Negative Negative   Protein, UA Negative Negative/Trace   Glucose, UA Negative Negative   Ketones, UA Trace (A) Negative   RBC, UA 1+ (A) Negative   Bilirubin, UA Negative Negative   Urobilinogen, Ur 0.2 0.2 - 1.0 mg/dL   Nitrite, UA Negative Negative   Microscopic Examination See below:      Assessment & Plan:    1. Microscopic hematuria:  Patient with persistent microscopic hematuria with a negative hematuria workup. I will refer her to nephrology for further evaluation. She  will contact our office if she should experience any gross hematuria.  - Urinalysis, Complete   Return for referral to nephrology.  These notes generated with voice recognition software. I apologize for typographical errors.  Zara Council, [facility] [address], [place], [place] [address] [phone]

## 2015-06-02 DIAGNOSIS — R3129 Other microscopic hematuria: Secondary | ICD-10-CM

## 2015-06-02 HISTORY — DX: Other microscopic hematuria: R31.29

## 2015-06-13 ENCOUNTER — Ambulatory Visit: Payer: Medicare Other | Admitting: Podiatry

## 2015-07-04 ENCOUNTER — Ambulatory Visit (INDEPENDENT_AMBULATORY_CARE_PROVIDER_SITE_OTHER): Payer: Medicare Other | Admitting: Podiatry

## 2015-07-04 ENCOUNTER — Encounter: Payer: Self-pay | Admitting: Podiatry

## 2015-07-04 DIAGNOSIS — B351 Tinea unguium: Secondary | ICD-10-CM

## 2015-07-04 DIAGNOSIS — M79676 Pain in unspecified toe(s): Secondary | ICD-10-CM

## 2015-07-04 NOTE — Progress Notes (Signed)
She presents today for follow-up of painful elongated toenails one through 5 right foot.  Objective: Vital signs are stable alert and oriented 3 pulses are palpable right foot. Toenails are thick and elongated and painful on palpation as well as debridement.  Assessment: Pain limb secondary to onychomycosis right foot.  Plan: Debridement of toenails 1 through 5 right foot.

## 2015-10-08 ENCOUNTER — Ambulatory Visit: Payer: Medicare Other | Admitting: Podiatry

## 2015-10-10 ENCOUNTER — Ambulatory Visit (INDEPENDENT_AMBULATORY_CARE_PROVIDER_SITE_OTHER): Payer: Medicare Other | Admitting: Podiatry

## 2015-10-10 ENCOUNTER — Encounter: Payer: Self-pay | Admitting: Podiatry

## 2015-10-10 DIAGNOSIS — B351 Tinea unguium: Secondary | ICD-10-CM

## 2015-10-10 DIAGNOSIS — M79676 Pain in unspecified toe(s): Secondary | ICD-10-CM | POA: Diagnosis not present

## 2015-10-10 NOTE — Progress Notes (Signed)
She presents today with chief complaint of painful elongated toenails 1 through 5 of the right foot.  Objective: Pulses remain palpable right foot. Nails are thick yellow dystrophic and mycotic.  Assessment: Pain elicited out of mycosis 1 through 5 right.  Plan: Debridement of toenails 1 through 5 right. Follow up with her in 2 months

## 2015-12-12 ENCOUNTER — Ambulatory Visit (INDEPENDENT_AMBULATORY_CARE_PROVIDER_SITE_OTHER): Payer: Medicare Other | Admitting: Podiatry

## 2015-12-12 ENCOUNTER — Encounter: Payer: Self-pay | Admitting: Podiatry

## 2015-12-12 DIAGNOSIS — M79676 Pain in unspecified toe(s): Secondary | ICD-10-CM

## 2015-12-12 DIAGNOSIS — B351 Tinea unguium: Secondary | ICD-10-CM | POA: Diagnosis not present

## 2015-12-12 NOTE — Progress Notes (Signed)
She presents today with chief complaint of painful nails 1 through 5 of the right foot.  Objective: Pulses are palpable right foot. Toenails are thick yellow dystrophic with mycotic and painful.  Assessment: Pain limb secondary to onychomycosis.  Plan: Debridement of nails 1 through 5 of the right foot. Follow-up with me as needed.

## 2016-02-13 ENCOUNTER — Ambulatory Visit: Payer: Medicare Other | Admitting: Podiatry

## 2016-02-25 ENCOUNTER — Other Ambulatory Visit: Payer: Self-pay | Admitting: Family Medicine

## 2016-02-25 DIAGNOSIS — Z1231 Encounter for screening mammogram for malignant neoplasm of breast: Secondary | ICD-10-CM

## 2016-02-27 ENCOUNTER — Ambulatory Visit
Admission: RE | Admit: 2016-02-27 | Discharge: 2016-02-27 | Disposition: A | Discharge status: Home / Self Care | Payer: Medicare Other | Source: Ambulatory Visit | Attending: Family Medicine | Admitting: Family Medicine

## 2016-02-27 DIAGNOSIS — Z1231 Encounter for screening mammogram for malignant neoplasm of breast: Secondary | ICD-10-CM | POA: Insufficient documentation

## 2016-03-03 ENCOUNTER — Ambulatory Visit (INDEPENDENT_AMBULATORY_CARE_PROVIDER_SITE_OTHER): Payer: Medicare Other | Admitting: Podiatry

## 2016-03-03 ENCOUNTER — Encounter: Payer: Self-pay | Admitting: Podiatry

## 2016-03-03 DIAGNOSIS — M79676 Pain in unspecified toe(s): Secondary | ICD-10-CM | POA: Diagnosis not present

## 2016-03-03 DIAGNOSIS — B351 Tinea unguium: Secondary | ICD-10-CM | POA: Diagnosis not present

## 2016-03-03 NOTE — Progress Notes (Signed)
She presents today for chief complaint of painful elongated toenails 1 through 5 of the right foot.  Objective: Vital signs are stable alert and oriented 3. Pulses are palpable. Toenails are long thick yellow dystrophic onychomycotic 1 through 5 of the right foot.  Assessment: Pain limp secondary to onychomycosis 1 through 5 right foot.  Plan: Debridement of toenails 1 through 5 of the right foot. Follow up with her in 3 months.

## 2016-05-07 ENCOUNTER — Ambulatory Visit (INDEPENDENT_AMBULATORY_CARE_PROVIDER_SITE_OTHER): Payer: Medicare Other | Admitting: Podiatry

## 2016-05-07 DIAGNOSIS — M79676 Pain in unspecified toe(s): Secondary | ICD-10-CM

## 2016-05-07 DIAGNOSIS — B351 Tinea unguium: Secondary | ICD-10-CM

## 2016-05-07 NOTE — Progress Notes (Signed)
She presents today to complaint of painful elongated toenails toes 1 through 5 of the right foot.  Objective: Pulses are palpable right foot. Toenails are long 1 through 5 right with thick mycotic changes.  Assessment: Pain limiting her onychomycosis.  Plan: Debridement of toenails 1 through 5 of the right foot.

## 2016-07-09 ENCOUNTER — Ambulatory Visit (INDEPENDENT_AMBULATORY_CARE_PROVIDER_SITE_OTHER): Payer: Medicare Other | Admitting: Podiatry

## 2016-07-09 ENCOUNTER — Encounter: Payer: Self-pay | Admitting: Podiatry

## 2016-07-09 DIAGNOSIS — B351 Tinea unguium: Secondary | ICD-10-CM

## 2016-07-09 DIAGNOSIS — M79676 Pain in unspecified toe(s): Secondary | ICD-10-CM | POA: Diagnosis not present

## 2016-07-09 NOTE — Progress Notes (Signed)
She presents today for chief complaint of nail problems right foot.  Objective: Pulses are palpable right foot. No open lesions or wounds are noted. Nails are long thick yellow dystrophic clinic mycotic.  Assessment: Pain limb secondary to onychomycosis 1 through 5 of the right foot.  Plan: Follow up with me in 2 months. Nails were debrided one through 5 right.

## 2016-10-11 NOTE — Progress Notes (Signed)
MRN : [id number]  Julie Acosta is a 83 y.o. (1934/01/11) female who presents with chief complaint of No chief complaint on file. Marland Kitchen  History of Present Illness: The patient returns to the office for followup evaluation regarding right leg swelling. The swelling has persisted and the pain associated with swelling continues. There have not been any interval development of a ulcerations or wounds.  Since the previous visit the patient has been wearing graduated compression stockings and has noted significant improvement in the lymphedema.  There have been no significant changes to the patient's overall health care.  The patient denies amaurosis fugax or recent TIA symptoms. There are no recent neurological changes noted. The patient denies history of DVT, PE or superficial thrombophlebitis. The patient denies recent episodes of angina or shortness of breath.  previous ABI's Rt=1.04 and the Lt=AKA  No outpatient prescriptions have been marked as taking for the 10/13/16 encounter (Appointment) with Delana Meyer, Dolores Lory, MD.    Past Medical History:  Diagnosis Date  . Anxiety   . Benign breast lumps   . Blood clotting disorder ([place])   . Bone cancer ([place])   . Cervicalgia   . Colon cancer ([place])   . Depression   . Diabetes ([place])   . Diverticulitis   . Dizzy   . GERD (gastroesophageal reflux disease)   . HBP (high blood pressure)   . Hematuria    gross  . Hemorrhoids   . HLD (hyperlipidemia)   . Muscle pain   . Osteoarthritis   . Osteoporosis   . Panic disorder   . Reflux   . Skin cancer   . Swelling     Past Surgical History:  Procedure Laterality Date  . APPENDECTOMY  1962  . BREAST BIOPSY Right 2006?   benign  . BREAST SURGERY Right   . CARPAL TUNNEL RELEASE Right   . CHOLECYSTECTOMY    . COLONOSCOPY WITH PROPOFOL N/A 11/22/2014   Procedure: COLONOSCOPY WITH PROPOFOL;  Surgeon: Manya Silvas, MD;  Location: [facility] ENDOSCOPY;  Service: Endoscopy;  Laterality: N/A;   . ESOPHAGOGASTRODUODENOSCOPY  11/22/2014   Procedure: ESOPHAGOGASTRODUODENOSCOPY (EGD);  Surgeon: Manya Silvas, MD;  Location: [facility] ENDOSCOPY;  Service: Endoscopy;;  . GALLBLADDER SURGERY    . HEMIPELVIC Left   . LEG SURGERY Left    AMPUTATION  . OVARY SURGERY Right   . SAVORY DILATION  11/22/2014   Procedure: SAVORY DILATION;  Surgeon: Manya Silvas, MD;  Location: [facility] ENDOSCOPY;  Service: Endoscopy;;  . SKIN CANCER EXCISION    . TONSILLECTOMY      Social History Social History  Substance Use Topics  . Smoking status: Never Smoker  . Smokeless tobacco: Never Used  . Alcohol use No    Family History Family History  Problem Relation Age of Onset  . Breast cancer Paternal Aunt   . Ovarian cancer Sister   . Prostate cancer Father   . Stroke Mother     Allergies  Allergen Reactions  . Amoxicillin   . Ciprocinonide [Fluocinolone]   . Codeine Other (See Comments)    HALLUCINATIONS  . Latex   . Lipitor [Atorvastatin]      REVIEW OF SYSTEMS (Negative unless checked)  Constitutional: [] Weight loss  [] Fever  [] Chills Cardiac: [] Chest pain   [] Chest pressure   [] Palpitations   [] Shortness of breath when laying flat   [] Shortness of breath with exertion. Vascular:  [] Pain in legs with walking   [] Pain in legs at rest  []   History of DVT   [] Phlebitis   [x] Swelling in legs   [] Varicose veins   [] Non-healing ulcers Pulmonary:   [] Uses home oxygen   [] Productive cough   [] Hemoptysis   [] Wheeze  [] COPD   [] Asthma Neurologic:  [] Dizziness   [] Seizures   [] History of stroke   [] History of TIA  [] Aphasia   [] Vissual changes   [] Weakness or numbness in arm   [] Weakness or numbness in leg Musculoskeletal:   [] Joint swelling   [] Joint pain   [] Low back pain Hematologic:  [] Easy bruising  [] Easy bleeding   [] Hypercoagulable state   [] Anemic Gastrointestinal:  [] Diarrhea   [] Vomiting  [] Gastroesophageal reflux/heartburn   [] Difficulty swallowing. Genitourinary:  [] Chronic kidney  disease   [] Difficult urination  [] Frequent urination   [] Blood in urine Skin:  [] Rashes   [] Ulcers  Psychological:  [] History of anxiety   []  History of major depression.  Physical Examination  There were no vitals filed for this visit. There is no height or weight on file to calculate BMI. Gen: WD/WN, NAD Head: Point /AT, No temporalis wasting.  Ear/Nose/Throat: Hearing grossly intact, nares w/o erythema or drainage Eyes: PER, EOMI, sclera nonicteric.  Neck: Supple, no large masses.   Pulmonary:  Good air movement, no audible wheezing bilaterally, no use of accessory muscles.  Cardiac: RRR, no JVD Vascular: dependent venous discoloration right foot, no ulcers Vessel Right Left  Radial Palpable Palpable  PT Trace Palpable AKA  DP Trace Palpable AKA  Gastrointestinal: Non-distended. No guarding/no peritoneal signs.  Musculoskeletal: M/S 5/5 throughout arms 5/5 right leg left leg AKA.  No atrophy.  Neurologic: CN 2-12 intact. Symmetrical.  Speech is fluent. Motor exam as listed above. Psychiatric: Judgment intact, Mood & affect appropriate for pt's clinical situation. Dermatologic: No rashes or ulcers noted.  No changes consistent with cellulitis. Lymph : No lichenification or skin changes of chronic lymphedema.  CBC Lab Results  Component Value Date   WBC 12.1 (H) 07/01/2013   HGB 12.9 07/01/2013   HCT 39.6 07/01/2013   MCV 83 07/01/2013   PLT 228 07/01/2013    BMET    Component Value Date/Time   NA 139 07/01/2013 1721   K 4.6 07/01/2013 1721   CL 108 (H) 07/01/2013 1721   CO2 23 07/01/2013 1721   GLUCOSE 150 (H) 07/01/2013 1721   BUN 21 (H) 07/01/2013 1721   CREATININE 0.95 07/01/2013 1721   CALCIUM 11.2 (H) 07/01/2013 1721   GFRNONAA 58 (L) 07/01/2013 1721   GFRAA >60 07/01/2013 1721   CrCl cannot be calculated (Patient's most recent lab result is older than the maximum 21 days allowed.).  COAG No results found for: INR, PROTIME  Radiology No results  found.  Assessment/Plan 1. Post-phlebitic syndrome No surgery or intervention at this point in time.    I have reviewed my discussion with the patient regarding venous insufficiency and secondary lymph edema and why it  causes symptoms. I have discussed with the patient the chronic skin changes that accompany these problems and the long term sequela such as ulceration and infection.  Patient will continue wearing graduated compression stockings class 1 (20-30 mmHg) on a daily basis a prescription was given to the patient to keep this updated. The patient will  put the stockings on first thing in the morning and removing them in the evening. The patient is instructed specifically not to sleep in the stockings.  In addition, behavioral modification including elevation during the day will be continued.  Diet and salt  restriction was also discussed.  Previous duplex ultrasound of the lower extremities shows normal deep venous system, superficial reflux was not present.   Given these findings she is happy with her control and does not want a pump at this time.  Following the review of the ultrasound the patient will follow up in 12 months to reassess the degree of swelling and the control that graduated compression is offering.   The patient can be assessed for a Lymph Pump at that time.  However, at this time the patient states they are satisfied with the control compression and elevation is yielding.    2. Right leg swelling See #1  3. Type 2 diabetes mellitus with complication, unspecified whether long term insulin use (HCC) Continue hypoglycemic medications as already ordered, these medications have been reviewed and there are no changes at this time.  Hgb A1C to be monitored as already arranged by primary service   4. Essential hypertension Continue antihypertensive medications as already ordered, these medications have been reviewed and there are no changes at this time.   5.  Hyperlipidemia, unspecified hyperlipidemia type Continue statin as ordered and reviewed, no changes at this time    Hortencia Pilar, MD  10/11/2016 10:38 PM

## 2016-10-13 ENCOUNTER — Encounter (INDEPENDENT_AMBULATORY_CARE_PROVIDER_SITE_OTHER): Payer: Self-pay | Admitting: Vascular Surgery

## 2016-10-13 ENCOUNTER — Ambulatory Visit (INDEPENDENT_AMBULATORY_CARE_PROVIDER_SITE_OTHER): Payer: Medicare Other | Admitting: Vascular Surgery

## 2016-10-13 VITALS — BP 127/76 | HR 89 | Resp 16 | Ht 60.0 in | Wt 168.0 lb

## 2016-10-13 DIAGNOSIS — E118 Type 2 diabetes mellitus with unspecified complications: Secondary | ICD-10-CM | POA: Diagnosis not present

## 2016-10-13 DIAGNOSIS — E785 Hyperlipidemia, unspecified: Secondary | ICD-10-CM

## 2016-10-13 DIAGNOSIS — M7989 Other specified soft tissue disorders: Secondary | ICD-10-CM | POA: Insufficient documentation

## 2016-10-13 DIAGNOSIS — I10 Essential (primary) hypertension: Secondary | ICD-10-CM

## 2016-10-13 DIAGNOSIS — I87009 Postthrombotic syndrome without complications of unspecified extremity: Secondary | ICD-10-CM | POA: Diagnosis not present

## 2016-10-15 ENCOUNTER — Encounter: Payer: Self-pay | Admitting: Podiatry

## 2016-10-15 ENCOUNTER — Ambulatory Visit (INDEPENDENT_AMBULATORY_CARE_PROVIDER_SITE_OTHER): Payer: Medicare Other | Admitting: Podiatry

## 2016-10-15 DIAGNOSIS — M79676 Pain in unspecified toe(s): Secondary | ICD-10-CM | POA: Diagnosis not present

## 2016-10-15 DIAGNOSIS — B351 Tinea unguium: Secondary | ICD-10-CM | POA: Diagnosis not present

## 2016-10-15 NOTE — Progress Notes (Signed)
She presents today for chief complaint of painful toenails 1 through 5 of the right foot.  Objective: Pulses remain palpable right. Mild cyanosis of the toes. Toenails are long thick yellow dystrophic.  Assessment: Painful onychomycosis.  Plan: Debridement of nails 1 through 5 right foot.

## 2016-11-06 ENCOUNTER — Encounter: Payer: Self-pay | Admitting: *Deleted

## 2016-11-11 ENCOUNTER — Encounter: Payer: Self-pay | Admitting: *Deleted

## 2016-11-11 ENCOUNTER — Ambulatory Visit: Payer: Medicare Other | Admitting: Registered Nurse

## 2016-11-11 ENCOUNTER — Ambulatory Visit
Admission: RE | Admit: 2016-11-11 | Discharge: 2016-11-11 | Disposition: A | Discharge status: Home / Self Care | Payer: Medicare Other | Source: Ambulatory Visit | Attending: Ophthalmology | Admitting: Ophthalmology

## 2016-11-11 ENCOUNTER — Encounter
Admission: RE | Disposition: A | Discharge status: Home / Self Care | Payer: Self-pay | Source: Ambulatory Visit | Attending: Ophthalmology

## 2016-11-11 DIAGNOSIS — F329 Major depressive disorder, single episode, unspecified: Secondary | ICD-10-CM | POA: Insufficient documentation

## 2016-11-11 DIAGNOSIS — H2512 Age-related nuclear cataract, left eye: Secondary | ICD-10-CM | POA: Insufficient documentation

## 2016-11-11 DIAGNOSIS — Z7984 Long term (current) use of oral hypoglycemic drugs: Secondary | ICD-10-CM | POA: Diagnosis not present

## 2016-11-11 DIAGNOSIS — E7800 Pure hypercholesterolemia, unspecified: Secondary | ICD-10-CM | POA: Insufficient documentation

## 2016-11-11 DIAGNOSIS — I10 Essential (primary) hypertension: Secondary | ICD-10-CM | POA: Diagnosis not present

## 2016-11-11 DIAGNOSIS — E119 Type 2 diabetes mellitus without complications: Secondary | ICD-10-CM | POA: Diagnosis not present

## 2016-11-11 DIAGNOSIS — Z79899 Other long term (current) drug therapy: Secondary | ICD-10-CM | POA: Insufficient documentation

## 2016-11-11 HISTORY — DX: Cough, unspecified: R05.9

## 2016-11-11 HISTORY — DX: Other complications of anesthesia, initial encounter: T88.59XA

## 2016-11-11 HISTORY — PX: CATARACT EXTRACTION W/PHACO: SHX586

## 2016-11-11 HISTORY — DX: Tremor, unspecified: R25.1

## 2016-11-11 HISTORY — DX: Cough: R05

## 2016-11-11 HISTORY — DX: Adverse effect of unspecified anesthetic, initial encounter: T41.45XA

## 2016-11-11 LAB — GLUCOSE, CAPILLARY: GLUCOSE-CAPILLARY: 216 mg/dL — AB (ref 65–99)

## 2016-11-11 SURGERY — PHACOEMULSIFICATION, CATARACT, WITH IOL INSERTION
Anesthesia: Monitor Anesthesia Care | Site: Eye | Laterality: Left | Wound class: Clean

## 2016-11-11 MED ORDER — POVIDONE-IODINE 5 % OP SOLN
OPHTHALMIC | Status: DC | PRN
  Administered 2016-11-11: 1 via OPHTHALMIC

## 2016-11-11 MED ORDER — MIDAZOLAM HCL 2 MG/2ML IJ SOLN
INTRAMUSCULAR | Status: DC | PRN
  Administered 2016-11-11: 1 mg via INTRAVENOUS

## 2016-11-11 MED ORDER — LIDOCAINE HCL (PF) 4 % IJ SOLN
INTRAMUSCULAR | Status: DC | PRN
  Administered 2016-11-11: 4 mL via OPHTHALMIC

## 2016-11-11 MED ORDER — CARBACHOL 0.01 % IO SOLN
INTRAOCULAR | Status: DC | PRN
  Administered 2016-11-11: 0.5 mL via INTRAOCULAR

## 2016-11-11 MED ORDER — ARMC OPHTHALMIC DILATING DROPS
OPHTHALMIC | Status: AC
  Administered 2016-11-11: 09:00:00
  Filled 2016-11-11: qty 0.4

## 2016-11-11 MED ORDER — POLYMYXIN B-TRIMETHOPRIM 10000-0.1 UNIT/ML-% OP SOLN
OPHTHALMIC | Status: AC
  Filled 2016-11-11: qty 10

## 2016-11-11 MED ORDER — NA CHONDROIT SULF-NA HYALURON 40-17 MG/ML IO SOLN
INTRAOCULAR | Status: DC | PRN
  Administered 2016-11-11: 1 mL via INTRAOCULAR

## 2016-11-11 MED ORDER — POLYMYXIN B-TRIMETHOPRIM 10000-0.1 UNIT/ML-% OP SOLN: 1.0000 [drp] | OPHTHALMIC | Status: DC | PRN

## 2016-11-11 MED ORDER — FENTANYL CITRATE (PF) 100 MCG/2ML IJ SOLN
INTRAMUSCULAR | Status: AC
  Filled 2016-11-11: qty 2

## 2016-11-11 MED ORDER — CEFUROXIME OPHTHALMIC INJECTION 1 MG/0.1 ML
INJECTION | OPHTHALMIC | Status: DC | PRN
  Administered 2016-11-11: 1 mg via INTRACAMERAL

## 2016-11-11 MED ORDER — FENTANYL CITRATE (PF) 100 MCG/2ML IJ SOLN
INTRAMUSCULAR | Status: DC | PRN
  Administered 2016-11-11: 25 ug via INTRAVENOUS

## 2016-11-11 MED ORDER — ARMC OPHTHALMIC DILATING DROPS
1.0000 "application " | OPHTHALMIC | Status: AC
  Administered 2016-11-11 (×2): 1 via OPHTHALMIC

## 2016-11-11 MED ORDER — SODIUM CHLORIDE 0.9 % IV SOLN
INTRAVENOUS | Status: DC
  Administered 2016-11-11: 10:00:00 via INTRAVENOUS

## 2016-11-11 MED ORDER — EPINEPHRINE PF 1 MG/ML IJ SOLN
INTRAOCULAR | Status: DC | PRN
  Administered 2016-11-11: 10:00:00 via OPHTHALMIC

## 2016-11-11 MED ORDER — MIDAZOLAM HCL 2 MG/2ML IJ SOLN
INTRAMUSCULAR | Status: AC
  Filled 2016-11-11: qty 2

## 2016-11-11 SURGICAL SUPPLY — 16 items
GLOVE BIO SURGEON STRL SZ8 (GLOVE) ×2 IMPLANT
GLOVE BIOGEL M 6.5 STRL (GLOVE) ×2 IMPLANT
GLOVE SURG LX 8.0 MICRO (GLOVE) ×1
GLOVE SURG LX STRL 8.0 MICRO (GLOVE) ×1 IMPLANT
GOWN STRL REUS W/ TWL LRG LVL3 (GOWN DISPOSABLE) ×2 IMPLANT
GOWN STRL REUS W/TWL LRG LVL3 (GOWN DISPOSABLE) ×2
LABEL CATARACT MEDS ST (LABEL) ×2 IMPLANT
LENS IOL TECNIS ITEC 16.0 (Intraocular Lens) ×2 IMPLANT
PACK CATARACT (MISCELLANEOUS) ×2 IMPLANT
PACK CATARACT BRASINGTON LX (MISCELLANEOUS) ×2 IMPLANT
PACK EYE AFTER SURG (MISCELLANEOUS) ×2 IMPLANT
SOL BSS BAG (MISCELLANEOUS) ×2
SOLUTION BSS BAG (MISCELLANEOUS) ×1 IMPLANT
SYR 5ML LL (SYRINGE) ×2 IMPLANT
WATER STERILE IRR 250ML POUR (IV SOLUTION) ×2 IMPLANT
WIPE NON LINTING 3.25X3.25 (MISCELLANEOUS) ×2 IMPLANT

## 2016-11-11 NOTE — Op Note (Signed)
PREOPERATIVE DIAGNOSIS:  Nuclear sclerotic cataract of the right eye.   POSTOPERATIVE DIAGNOSIS:  NUCLEAR SCLEROTIC CATARACT LEFT EYE   OPERATIVE PROCEDURE: Procedure(s): CATARACT EXTRACTION PHACO AND INTRAOCULAR LENS PLACEMENT (IOC)   SURGEON:  Birder Robson, MD.   ANESTHESIA:  Anesthesiologist: Piscitello, Precious Haws, MD CRNA: Hedda Slade, CRNA  1.      Managed anesthesia care. 2.      0.13ml of Shugarcaine was instilled in the eye following the paracentesis.   COMPLICATIONS:  None.   TECHNIQUE:   Stop and chop   DESCRIPTION OF PROCEDURE:  The patient was examined and consented in the preoperative holding area where the aforementioned topical anesthesia was applied to the right eye and then brought back to the Operating Room where the right eye was prepped and draped in the usual sterile ophthalmic fashion and a lid speculum was placed. A paracentesis was created with the side port blade and the anterior chamber was filled with viscoelastic. A near clear corneal incision was performed with the steel keratome. A continuous curvilinear capsulorrhexis was performed with a cystotome followed by the capsulorrhexis forceps. Hydrodissection and hydrodelineation were carried out with BSS on a blunt cannula. The lens was removed in a stop and chop  technique and the remaining cortical material was removed with the irrigation-aspiration handpiece. The capsular bag was inflated with viscoelastic and the Technis [id number]  lens was placed in the capsular bag without complication. The remaining viscoelastic was removed from the eye with the irrigation-aspiration handpiece. The wounds were hydrated. The anterior chamber was flushed with Miostat and the eye was inflated to physiologic pressure. 0.33ml of Cefuroxime was placed in the anterior chamber. The wounds were found to be water tight. The eye was dressed with Polytrim. The patient was given protective glasses to wear throughout the day and a shield with  which to sleep tonight. The patient was also given drops with which to begin a drop regimen today and will follow-up with me in one day.  Implant Name Type Inv. Item Serial No. Manufacturer Lot No. LRB No. Used  LENS IOL DIOP 16.0 - [id number] Intraocular Lens LENS IOL DIOP 16.0 [id number]   Left 1   Procedure(s) with comments: CATARACT EXTRACTION PHACO AND INTRAOCULAR LENS PLACEMENT (IOC) (Left) - [place] 01:07.9 AP% 19.2 CDE 13.02 Fluid pack lot # [id number]  Electronically signed: [place] 11/11/2016 10:31 AM

## 2016-11-11 NOTE — Discharge Instructions (Signed)
Eye Surgery Discharge Instructions  Expect mild scratchy sensation or mild soreness. DO NOT RUB YOUR EYE!  The day of surgery:  Minimal physical activity, but bed rest is not required  No reading, computer work, or close hand work  No bending, lifting, or straining.  May watch TV  For 24 hours:  No driving, legal decisions, or alcoholic beverages  Safety precautions  Eat anything you prefer: It is better to start with liquids, then soup then solid foods.  _____ Eye patch should be worn until postoperative exam tomorrow.  ____ Solar shield eyeglasses should be worn for comfort in the sunlight/patch while sleeping  Resume all regular medications including aspirin or Coumadin if these were discontinued prior to surgery. You may shower, bathe, shave, or wash your hair. Tylenol may be taken for mild discomfort.  Call your doctor if you experience significant pain, nausea, or vomiting, fever > 101 or other signs of infection. [phone] or [phone] Specific instructions:  Follow-up Information    Birder Robson, MD Follow up.   Specialty:  Ophthalmology Why:  July 11 at 10:05am Contact information: [address] [place] [address] [phone]

## 2016-11-11 NOTE — Anesthesia Postprocedure Evaluation (Signed)
Anesthesia Post Note  Patient: Julie Acosta  Procedure(s) Performed: Procedure(s) (LRB): CATARACT EXTRACTION PHACO AND INTRAOCULAR LENS PLACEMENT (IOC) (Left)  Patient location during evaluation: PACU Anesthesia Type: MAC Level of consciousness: awake Pain management: pain level controlled Vital Signs Assessment: post-procedure vital signs reviewed and stable Respiratory status: spontaneous breathing Cardiovascular status: blood pressure returned to baseline Postop Assessment: no signs of nausea or vomiting Anesthetic complications: no     Last Vitals:  Vitals:   11/11/16 0911 11/11/16 1032  BP: (!) 126/51 (!) 104/37  Pulse: 77 74  Resp: 16 12  Temp: 36.9 C 37.2 C    Last Pain:  Vitals:   11/11/16 1032  TempSrc: Temporal                 Affie Gasner Lorenza Chick

## 2016-11-11 NOTE — Transfer of Care (Signed)
Immediate Anesthesia Transfer of Care Note  Patient: Julie Acosta  Procedure(s) Performed: Procedure(s) with comments: CATARACT EXTRACTION PHACO AND INTRAOCULAR LENS PLACEMENT (IOC) (Left) - [place] 01:07.9 AP% 19.2 CDE 13.02 Fluid pack lot # [id number]  Patient Location: PACU  Anesthesia Type:MAC  Level of Consciousness: awake, alert  and oriented  Airway & Oxygen Therapy: Patient Spontanous Breathing  Post-op Assessment: Report given to RN and Post -op Vital signs reviewed and stable  Post vital signs: Reviewed and stable  Last Vitals:  Vitals:   11/11/16 0911 11/11/16 1032  BP: (!) 126/51 (!) 104/37  Pulse: 77 74  Resp: 16 12  Temp: 36.9 C 37.2 C    Last Pain:  Vitals:   11/11/16 1032  TempSrc: Temporal         Complications: No apparent anesthesia complications

## 2016-11-11 NOTE — Anesthesia Preprocedure Evaluation (Signed)
Anesthesia Evaluation  Patient identified by MRN, date of birth, ID band Patient awake    Reviewed: Allergy & Precautions, NPO status , Patient's Chart, lab work & pertinent test results, reviewed documented beta blocker date and time   History of Anesthesia Complications (+) history of anesthetic complications  Airway Mallampati: II  TM Distance: >3 FB     Dental  (+) Chipped   Pulmonary           Cardiovascular hypertension, + Peripheral Vascular Disease       Neuro/Psych    GI/Hepatic GERD  ,  Endo/Other  diabetes, Type 2  Renal/GU      Musculoskeletal  (+) Arthritis ,   Abdominal   Peds  Hematology   Anesthesia Other Findings Blood clotting disorder.  Past Medical History: No date: Anxiety No date: Benign breast lumps No date: Blood clotting disorder (HCC) No date: Bone cancer ([place]) No date: Cervicalgia No date: Colon cancer ([place]) No date: Complication of anesthesia     Comment: MOOD ALTERATION / UNSURE IF ANES OR PAIN MED.               NO TROUBLE WITH MOH'S No date: Cough     Comment: NASAL DRIP / SNEEZING / SORE THROAT MOSTLY               CONSTANT No date: Depression No date: Diabetes ([place]) No date: Diverticulitis No date: Dizzy No date: GERD (gastroesophageal reflux disease) No date: HBP (high blood pressure) No date: Hematuria     Comment: gross No date: Hemorrhoids No date: HLD (hyperlipidemia) No date: Muscle pain No date: Osteoarthritis No date: Osteoporosis No date: Panic disorder No date: Reflux No date: Skin cancer No date: Swelling No date: Tremors of nervous system   Reproductive/Obstetrics                             Anesthesia Physical  Anesthesia Plan  ASA: III  Anesthesia Plan: MAC   Post-op Pain Management:    Induction: Intravenous  PONV Risk Score and Plan:   Airway Management Planned: Nasal Cannula  Additional Equipment:    Intra-op Plan:   Post-operative Plan:   Informed Consent: I have reviewed the patients History and Physical, chart, labs and discussed the procedure including the risks, benefits and alternatives for the proposed anesthesia with the patient or authorized representative who has indicated his/her understanding and acceptance.     Plan Discussed with: CRNA  Anesthesia Plan Comments:         Anesthesia Quick Evaluation

## 2016-11-11 NOTE — Anesthesia Post-op Follow-up Note (Cosign Needed)
Anesthesia QCDR form completed.        

## 2016-11-11 NOTE — H&P (Signed)
All labs reviewed. Abnormal studies sent to patients PCP when indicated.  Previous H&P reviewed, patient examined, there are NO CHANGES.  Julie Acosta LOUIS7/10/201810:04 AM

## 2016-11-11 NOTE — Anesthesia Procedure Notes (Signed)
Procedure Name: MAC Date/Time: 11/11/2016 10:14 AM Performed by: Hedda Slade Pre-anesthesia Checklist: Patient identified, Emergency Drugs available, Suction available and Patient being monitored Patient Re-evaluated:Patient Re-evaluated prior to inductionOxygen Delivery Method: Nasal cannula

## 2016-12-03 ENCOUNTER — Encounter: Payer: Self-pay | Admitting: *Deleted

## 2016-12-09 ENCOUNTER — Ambulatory Visit
Admission: RE | Admit: 2016-12-09 | Discharge: 2016-12-09 | Disposition: A | Discharge status: Home / Self Care | Payer: Medicare Other | Source: Ambulatory Visit | Attending: Ophthalmology | Admitting: Ophthalmology

## 2016-12-09 ENCOUNTER — Ambulatory Visit: Payer: Medicare Other | Admitting: Anesthesiology

## 2016-12-09 ENCOUNTER — Encounter
Admission: RE | Disposition: A | Discharge status: Home / Self Care | Payer: Self-pay | Source: Ambulatory Visit | Attending: Ophthalmology

## 2016-12-09 ENCOUNTER — Encounter: Payer: Self-pay | Admitting: *Deleted

## 2016-12-09 DIAGNOSIS — E1136 Type 2 diabetes mellitus with diabetic cataract: Secondary | ICD-10-CM | POA: Diagnosis not present

## 2016-12-09 HISTORY — DX: Postnasal drip: R09.82

## 2016-12-09 HISTORY — PX: CATARACT EXTRACTION W/PHACO: SHX586

## 2016-12-09 LAB — GLUCOSE, CAPILLARY: Glucose-Capillary: 197 mg/dL — ABNORMAL HIGH (ref 65–99)

## 2016-12-09 SURGERY — PHACOEMULSIFICATION, CATARACT, WITH IOL INSERTION
Anesthesia: Monitor Anesthesia Care | Site: Eye | Laterality: Right | Wound class: Clean

## 2016-12-09 MED ORDER — ARMC OPHTHALMIC DILATING DROPS
OPHTHALMIC | Status: AC
  Filled 2016-12-09: qty 0.4

## 2016-12-09 MED ORDER — CEFUROXIME OPHTHALMIC INJECTION 1 MG/0.1 ML
INJECTION | OPHTHALMIC | Status: DC | PRN
  Administered 2016-12-09: .1 mL via INTRACAMERAL

## 2016-12-09 MED ORDER — POLYMYXIN B-TRIMETHOPRIM 10000-0.1 UNIT/ML-% OP SOLN: 1.0000 [drp] | OPHTHALMIC | Status: DC | PRN

## 2016-12-09 MED ORDER — BSS IO SOLN
INTRAOCULAR | Status: DC | PRN
  Administered 2016-12-09: 2 mL via OPHTHALMIC

## 2016-12-09 MED ORDER — POLYMYXIN B-TRIMETHOPRIM 10000-0.1 UNIT/ML-% OP SOLN
OPHTHALMIC | Status: DC | PRN
  Administered 2016-12-09: 1 [drp] via OPHTHALMIC

## 2016-12-09 MED ORDER — POVIDONE-IODINE 5 % OP SOLN
OPHTHALMIC | Status: DC | PRN
  Administered 2016-12-09: 1 via OPHTHALMIC

## 2016-12-09 MED ORDER — POLYMYXIN B-TRIMETHOPRIM 10000-0.1 UNIT/ML-% OP SOLN
OPHTHALMIC | Status: AC
  Filled 2016-12-09: qty 10

## 2016-12-09 MED ORDER — SODIUM CHLORIDE 0.9 % IV SOLN
INTRAVENOUS | Status: DC
  Administered 2016-12-09: 10:00:00 via INTRAVENOUS

## 2016-12-09 MED ORDER — NA CHONDROIT SULF-NA HYALURON 40-17 MG/ML IO SOLN
INTRAOCULAR | Status: DC | PRN
  Administered 2016-12-09: 1 mL via INTRAOCULAR

## 2016-12-09 MED ORDER — MIDAZOLAM HCL 2 MG/2ML IJ SOLN
INTRAMUSCULAR | Status: AC
  Filled 2016-12-09: qty 2

## 2016-12-09 MED ORDER — EPINEPHRINE PF 1 MG/ML IJ SOLN
INTRAOCULAR | Status: DC | PRN
  Administered 2016-12-09: 1 mL via OPHTHALMIC

## 2016-12-09 MED ORDER — ARMC OPHTHALMIC DILATING DROPS
1.0000 "application " | OPHTHALMIC | Status: AC
  Administered 2016-12-09 (×3): 1 via OPHTHALMIC

## 2016-12-09 MED ORDER — MIDAZOLAM HCL 2 MG/2ML IJ SOLN
INTRAMUSCULAR | Status: DC | PRN
Start: 2016-12-09 — End: 2016-12-09
  Administered 2016-12-09: 1 mg via INTRAVENOUS

## 2016-12-09 MED ORDER — FENTANYL CITRATE (PF) 100 MCG/2ML IJ SOLN
INTRAMUSCULAR | Status: DC | PRN
  Administered 2016-12-09: 25 ug via INTRAVENOUS

## 2016-12-09 MED ORDER — FENTANYL CITRATE (PF) 100 MCG/2ML IJ SOLN
INTRAMUSCULAR | Status: AC
  Filled 2016-12-09: qty 2

## 2016-12-09 SURGICAL SUPPLY — 16 items
GLOVE BIO SURGEON STRL SZ8 (GLOVE) ×2 IMPLANT
GLOVE BIOGEL M 6.5 STRL (GLOVE) ×2 IMPLANT
GLOVE SURG LX 8.0 MICRO (GLOVE) ×1
GLOVE SURG LX STRL 8.0 MICRO (GLOVE) ×1 IMPLANT
GOWN STRL REUS W/ TWL LRG LVL3 (GOWN DISPOSABLE) ×2 IMPLANT
GOWN STRL REUS W/TWL LRG LVL3 (GOWN DISPOSABLE) ×2
LABEL CATARACT MEDS ST (LABEL) ×2 IMPLANT
LENS IOL TECNIS ITEC 16.0 (Intraocular Lens) ×2 IMPLANT
PACK CATARACT (MISCELLANEOUS) ×2 IMPLANT
PACK CATARACT BRASINGTON LX (MISCELLANEOUS) ×2 IMPLANT
PACK EYE AFTER SURG (MISCELLANEOUS) ×2 IMPLANT
SOL BSS BAG (MISCELLANEOUS) ×2
SOLUTION BSS BAG (MISCELLANEOUS) ×1 IMPLANT
SYR 5ML LL (SYRINGE) ×2 IMPLANT
WATER STERILE IRR 250ML POUR (IV SOLUTION) ×2 IMPLANT
WIPE NON LINTING 3.25X3.25 (MISCELLANEOUS) ×2 IMPLANT

## 2016-12-09 NOTE — Anesthesia Preprocedure Evaluation (Signed)
Anesthesia Evaluation  Patient identified by MRN, date of birth, ID band Patient awake    Reviewed: Allergy & Precautions, NPO status , Patient's Chart, lab work & pertinent test results, reviewed documented beta blocker date and time   History of Anesthesia Complications (+) history of anesthetic complications  Airway Mallampati: II  TM Distance: >3 FB     Dental  (+) Chipped   Pulmonary           Cardiovascular hypertension, + Peripheral Vascular Disease       Neuro/Psych    GI/Hepatic GERD  ,  Endo/Other  diabetes, Type 2  Renal/GU      Musculoskeletal  (+) Arthritis ,   Abdominal   Peds  Hematology   Anesthesia Other Findings Blood clotting disorder.  Past Medical History: No date: Anxiety No date: Benign breast lumps No date: Blood clotting disorder (HCC) No date: Bone cancer ([place]) No date: Cervicalgia No date: Colon cancer ([place]) No date: Complication of anesthesia     Comment: MOOD ALTERATION / UNSURE IF ANES OR PAIN MED.               NO TROUBLE WITH MOH'S No date: Cough     Comment: NASAL DRIP / SNEEZING / SORE THROAT MOSTLY               CONSTANT No date: Depression No date: Diabetes ([place]) No date: Diverticulitis No date: Dizzy No date: GERD (gastroesophageal reflux disease) No date: HBP (high blood pressure) No date: Hematuria     Comment: gross No date: Hemorrhoids No date: HLD (hyperlipidemia) No date: Muscle pain No date: Osteoarthritis No date: Osteoporosis No date: Panic disorder No date: Reflux No date: Skin cancer No date: Swelling No date: Tremors of nervous system   Reproductive/Obstetrics                             Anesthesia Physical  Anesthesia Plan  ASA: III  Anesthesia Plan: MAC   Post-op Pain Management:    Induction: Intravenous  PONV Risk Score and Plan:   Airway Management Planned: Nasal Cannula  Additional Equipment:    Intra-op Plan:   Post-operative Plan:   Informed Consent: I have reviewed the patients History and Physical, chart, labs and discussed the procedure including the risks, benefits and alternatives for the proposed anesthesia with the patient or authorized representative who has indicated his/her understanding and acceptance.     Plan Discussed with: CRNA  Anesthesia Plan Comments:         Anesthesia Quick Evaluation

## 2016-12-09 NOTE — Anesthesia Postprocedure Evaluation (Signed)
Anesthesia Post Note  Patient: Julie Acosta  Procedure(s) Performed: Procedure(s) (LRB): CATARACT EXTRACTION PHACO AND INTRAOCULAR LENS PLACEMENT (IOC) (Right)  Patient location during evaluation: PACU Anesthesia Type: MAC Level of consciousness: awake and alert Pain management: pain level controlled Vital Signs Assessment: post-procedure vital signs reviewed and stable Respiratory status: spontaneous breathing, nonlabored ventilation, respiratory function stable and patient connected to nasal cannula oxygen Cardiovascular status: stable and blood pressure returned to baseline Anesthetic complications: no     Last Vitals:  Vitals:   12/09/16 0934 12/09/16 1103  BP: (!) 112/52 (!) 121/46  Pulse: 82 77  Resp: 14 10  Temp: 36.8 C 36.8 C    Last Pain:  Vitals:   12/09/16 1103  TempSrc: Oral                 Estill Batten

## 2016-12-09 NOTE — H&P (Signed)
All labs reviewed. Abnormal studies sent to patients PCP when indicated.  Previous H&P reviewed, patient examined, there are NO CHANGES.  Julie Acosta LOUIS8/7/201810:34 AM

## 2016-12-09 NOTE — Transfer of Care (Signed)
Immediate Anesthesia Transfer of Care Note  Patient: Julie Acosta  Procedure(s) Performed: Procedure(s) with comments: CATARACT EXTRACTION PHACO AND INTRAOCULAR LENS PLACEMENT (IOC) (Right) - [place] 00:49 AP% 21.2 CDE 10.39 Fluid pack lot # [id number]  Patient Location: Short Stay  Anesthesia Type:MAC  Level of Consciousness: awake, alert  and oriented  Airway & Oxygen Therapy: Patient Spontanous Breathing  Post-op Assessment: Post -op Vital signs reviewed and stable  Post vital signs: stable  Last Vitals:  Vitals:   12/09/16 0934 12/09/16 1103  BP: (!) 112/52 (!) 121/46  Pulse: 82 77  Resp: 14 10  Temp: 36.8 C 36.8 C    Last Pain:  Vitals:   12/09/16 1103  TempSrc: Oral         Complications: No apparent anesthesia complications

## 2016-12-09 NOTE — Anesthesia Post-op Follow-up Note (Signed)
Anesthesia QCDR form completed.        

## 2016-12-09 NOTE — Op Note (Signed)
PREOPERATIVE DIAGNOSIS:  Nuclear sclerotic cataract of the left eye.   POSTOPERATIVE DIAGNOSIS:  nuclear sclerotic cataract right eye   OPERATIVE PROCEDURE:  Procedure(s): CATARACT EXTRACTION PHACO AND INTRAOCULAR LENS PLACEMENT (IOC)   SURGEON:  Birder Robson, MD.   ANESTHESIA:   Anesthesiologist: Piscitello, Precious Haws, MD CRNA: Aline Brochure, CRNA  1.      Managed anesthesia care. 2.      Topical tetracaine drops followed by 2% Xylocaine jelly applied in the preoperative holding area.   COMPLICATIONS:  None.   TECHNIQUE:   Stop and chop   DESCRIPTION OF PROCEDURE:  The patient was examined and consented in the preoperative holding area where the aforementioned topical anesthesia was applied to the left eye and then brought back to the Operating Room where the left eye was prepped and draped in the usual sterile ophthalmic fashion and a lid speculum was placed. A paracentesis was created with the side port blade and the anterior chamber was filled with viscoelastic. A near clear corneal incision was performed with the steel keratome. A continuous curvilinear capsulorrhexis was performed with a cystotome followed by the capsulorrhexis forceps. Hydrodissection and hydrodelineation were carried out with BSS on a blunt cannula. The lens was removed in a stop and chop  technique and the remaining cortical material was removed with the irrigation-aspiration handpiece. The capsular bag was inflated with viscoelastic and the Technis [id number] lens was placed in the capsular bag without complication. The remaining viscoelastic was removed from the eye with the irrigation-aspiration handpiece. The wounds were hydrated. The anterior chamber was flushed with Miostat and the eye was inflated to physiologic pressure. 0.1 mL of cefuroxime concentration 10 mg/mL was placed in the anterior chamber. The wounds were found to be water tight. The eye was dressed with Vigamox. The patient was given protective  glasses to wear throughout the day and a shield with which to sleep tonight. The patient was also given drops with which to begin a drop regimen today and will follow-up with me in one day.  Implant Name Type Inv. Item Serial No. Manufacturer Lot No. LRB No. Used  LENS IOL DIOP 16.0 - [id number] Intraocular Lens LENS IOL DIOP 16.0 [id number] AMO   Right 1   Procedure(s) with comments: CATARACT EXTRACTION PHACO AND INTRAOCULAR LENS PLACEMENT (IOC) (Right) - [place] 00:49 AP% 21.2 CDE 10.39 Fluid pack lot # [id number]  Electronically signed: [place] 12/09/2016 11:00 AM

## 2016-12-09 NOTE — Discharge Instructions (Signed)
Eye Surgery Discharge Instructions  Expect mild scratchy sensation or mild soreness. DO NOT RUB YOUR EYE!  The day of surgery:  Minimal physical activity, but bed rest is not required  No reading, computer work, or close hand work  No bending, lifting, or straining.  May watch TV  For 24 hours:  No driving, legal decisions, or alcoholic beverages  Safety precautions  Eat anything you prefer: It is better to start with liquids, then soup then solid foods.  _____ Eye patch should be worn until postoperative exam tomorrow.  ____ Solar shield eyeglasses should be worn for comfort in the sunlight/patch while sleeping  Resume all regular medications including aspirin or Coumadin if these were discontinued prior to surgery. You may shower, bathe, shave, or wash your hair. Tylenol may be taken for mild discomfort.  Call your doctor if you experience significant pain, nausea, or vomiting, fever > 101 or other signs of infection. [phone] or [phone] Specific instructions:  Follow-up Information    Birder Robson, MD Follow up.   Specialty:  Ophthalmology Why:  August 8 at 10:10am Contact information: [address] [place] [address] [phone]

## 2017-01-01 ENCOUNTER — Telehealth (INDEPENDENT_AMBULATORY_CARE_PROVIDER_SITE_OTHER): Payer: Self-pay

## 2017-01-01 NOTE — Telephone Encounter (Signed)
OK.  Keep that appointment.  Should see urology as well if she isnt already

## 2017-01-01 NOTE — Telephone Encounter (Signed)
Spoke with the patient and let her know what Dr. Lucky Cowboy recommended. See note below.

## 2017-01-01 NOTE — Telephone Encounter (Signed)
The patient called stating she has had blood in her urine since the middle of July and she is on Xarelto, she has an appt with Dr. Delana Meyer on 01/12/17.

## 2017-01-12 ENCOUNTER — Ambulatory Visit (INDEPENDENT_AMBULATORY_CARE_PROVIDER_SITE_OTHER): Payer: Medicare Other | Admitting: Vascular Surgery

## 2017-01-12 ENCOUNTER — Encounter (INDEPENDENT_AMBULATORY_CARE_PROVIDER_SITE_OTHER): Payer: Self-pay | Admitting: Vascular Surgery

## 2017-01-12 VITALS — BP 114/61 | HR 85 | Resp 14 | Ht <= 58 in

## 2017-01-12 DIAGNOSIS — I87009 Postthrombotic syndrome without complications of unspecified extremity: Secondary | ICD-10-CM

## 2017-01-12 DIAGNOSIS — I10 Essential (primary) hypertension: Secondary | ICD-10-CM

## 2017-01-12 DIAGNOSIS — M7989 Other specified soft tissue disorders: Secondary | ICD-10-CM

## 2017-01-12 DIAGNOSIS — R319 Hematuria, unspecified: Secondary | ICD-10-CM | POA: Diagnosis not present

## 2017-01-12 DIAGNOSIS — R197 Diarrhea, unspecified: Secondary | ICD-10-CM

## 2017-01-12 DIAGNOSIS — K529 Noninfective gastroenteritis and colitis, unspecified: Secondary | ICD-10-CM | POA: Diagnosis not present

## 2017-01-12 NOTE — Progress Notes (Signed)
MRN : [id number]  Julie Acosta is a 83 y.o. (1933-09-21) female who presents with chief complaint of  Chief Complaint  Patient presents with  . Follow-up    Blood in urine  .  History of Present Illness:  The patient presents to the office for evaluation of DVT.  DVT was identified at [facility] by Duplex ultrasound.  The initial symptoms were pain and swelling in the lower extremity.  The patient notes the leg continues to be very painful with dependency and swells quite a bite.  Symptoms are much better with elevation.  The patient notes minimal edema in the morning which steadily worsens throughout the day.    She presents sooner than expected today because she has been having gross hematuria.  This has been going on for about 2-3 weeks.  It is occurring intermittently.  She denies pain with urination.  The patient has not been using compression therapy at this point.  No SOB or pleuritic chest pains.  No cough or hemoptysis.  No blood per rectum but she is having 4+ loose stools per day.  No blood in any sputum.  No excessive bruising per the patient.       No outpatient prescriptions have been marked as taking for the 01/12/17 encounter (Office Visit) with Delana Meyer, Dolores Lory, MD.    Past Medical History:  Diagnosis Date  . Anxiety   . Benign breast lumps   . Blood clotting disorder ([place])   . Bone cancer ([place])   . Cervicalgia   . Colon cancer ([place])   . Complication of anesthesia    MOOD ALTERATION / UNSURE IF ANES OR PAIN MED. NO TROUBLE WITH MOH'S  . Cough    NASAL DRIP / SNEEZING / SORE THROAT MOSTLY CONSTANT  . Depression   . Diabetes ([place])   . Diverticulitis   . Dizzy   . GERD (gastroesophageal reflux disease)   . HBP (high blood pressure)   . Hematuria    gross  . Hemorrhoids   . HLD (hyperlipidemia)   . Muscle pain   . Osteoarthritis   . Osteoporosis   . Panic disorder   . PND (post-nasal drip)    CHRONIC WITH SORE THROAT AND SNEEZING  . Reflux   .  Skin cancer   . Swelling   . Tremors of nervous system     Past Surgical History:  Procedure Laterality Date  . APPENDECTOMY  1962  . BREAST BIOPSY Right 2006?   benign  . BREAST SURGERY Right   . CARPAL TUNNEL RELEASE Right   . CATARACT EXTRACTION W/PHACO Left 11/11/2016   Procedure: CATARACT EXTRACTION PHACO AND INTRAOCULAR LENS PLACEMENT (IOC);  Surgeon: Birder Robson, MD;  Location: [facility] ORS;  Service: Ophthalmology;  Laterality: Left;  [place] 01:07.9 AP% 19.2 CDE 13.02 Fluid pack lot # [id number]  . CATARACT EXTRACTION W/PHACO Right 12/09/2016   Procedure: CATARACT EXTRACTION PHACO AND INTRAOCULAR LENS PLACEMENT (IOC);  Surgeon: Birder Robson, MD;  Location: [facility] ORS;  Service: Ophthalmology;  Laterality: Right;  [place] 00:49 AP% 21.2 CDE 10.39 Fluid pack lot # [id number]  . CHOLECYSTECTOMY    . COLONOSCOPY WITH PROPOFOL N/A 11/22/2014   Procedure: COLONOSCOPY WITH PROPOFOL;  Surgeon: Manya Silvas, MD;  Location: [facility] ENDOSCOPY;  Service: Endoscopy;  Laterality: N/A;  . ESOPHAGOGASTRODUODENOSCOPY  11/22/2014   Procedure: ESOPHAGOGASTRODUODENOSCOPY (EGD);  Surgeon: Manya Silvas, MD;  Location: [facility] ENDOSCOPY;  Service: Endoscopy;;  . GALLBLADDER SURGERY    . HEMIPELVIC  Left   . LEG SURGERY Left    AMPUTATION  . OVARY SURGERY Right   . SAVORY DILATION  11/22/2014   Procedure: SAVORY DILATION;  Surgeon: Manya Silvas, MD;  Location: [facility] ENDOSCOPY;  Service: Endoscopy;;  . SKIN CANCER EXCISION    . TONSILLECTOMY      Social History Social History  Substance Use Topics  . Smoking status: Never Smoker  . Smokeless tobacco: Never Used  . Alcohol use No    Family History Family History  Problem Relation Age of Onset  . Breast cancer Paternal Aunt   . Ovarian cancer Sister   . Prostate cancer Father   . Stroke Mother     Allergies  Allergen Reactions  . Amoxicillin     Upset stomach Has patient had a PCN reaction causing immediate rash, facial/tongue/throat  swelling, SOB or lightheadedness with hypotension: No Has patient had a PCN reaction causing severe rash involving mucus membranes or skin necrosis: No Has patient had a PCN reaction that required hospitalization: No Has patient had a PCN reaction occurring within the last 10 years: Yes If all of the above answers are "NO", then may proceed with Cephalosporin use.;  . Cefuroxime     OK INTRACAMERALLY PER DR WLP, upset stomach  . Ciprocinonide [Fluocinolone]     unknown  . Codeine Other (See Comments)    HALLUCINATIONS  . Lipitor [Atorvastatin]   . Latex Rash    Rast test NEGATIVE     REVIEW OF SYSTEMS (Negative unless checked)  Constitutional: [] Weight loss  [] Fever  [] Chills Cardiac: [] Chest pain   [] Chest pressure   [] Palpitations   [] Shortness of breath when laying flat   [] Shortness of breath with exertion. Vascular:  [] Pain in legs with walking   [] Pain in legs at rest  [] History of DVT   [] Phlebitis   [] Swelling in legs   [] Varicose veins   [] Non-healing ulcers Pulmonary:   [] Uses home oxygen   [] Productive cough   [] Hemoptysis   [] Wheeze  [] COPD   [] Asthma Neurologic:  [] Dizziness   [] Seizures   [] History of stroke   [] History of TIA  [] Aphasia   [] Vissual changes   [] Weakness or numbness in arm   [] Weakness or numbness in leg Musculoskeletal:   [] Joint swelling   [] Joint pain   [] Low back pain Hematologic:  [] Easy bruising  [] Easy bleeding   [] Hypercoagulable state   [] Anemic Gastrointestinal:  [] Diarrhea   [] Vomiting  [] Gastroesophageal reflux/heartburn   [] Difficulty swallowing. Genitourinary:  [] Chronic kidney disease   [] Difficult urination  [] Frequent urination   [x] Blood in urine Skin:  [] Rashes   [] Ulcers  Psychological:  [] History of anxiety   []  History of major depression.  Physical Examination  Vitals:   01/12/17 1307  BP: 114/61  Pulse: 85  Resp: 14  Height: 4\' 10"  (1.473 m)   There is no height or weight on file to calculate BMI. Gen: WD/WN, NAD Head:  [place]/AT, No temporalis wasting.  Ear/Nose/Throat: Hearing grossly intact, nares w/o erythema or drainage Eyes: PER, EOMI, sclera nonicteric.  Neck: Supple, no large masses.   Pulmonary:  Good air movement, no audible wheezing bilaterally, no use of accessory muscles.  Cardiac: RRR, no JVD Vascular:  Vessel Right Left  Radial Palpable Palpable  Ulnar Palpable Palpable  Brachial Palpable Palpable  Carotid Palpable Palpable  Femoral Palpable Palpable  Popliteal Palpable Palpable  PT Palpable Palpable  DP Palpable Palpable  Gastrointestinal: Non-distended. No guarding/no peritoneal signs.  Musculoskeletal: M/S 5/5  throughout.  No deformity or atrophy.  Neurologic: CN 2-12 intact. Symmetrical.  Speech is fluent. Motor exam as listed above. Psychiatric: Judgment intact, Mood & affect appropriate for pt's clinical situation. Dermatologic: No rashes or ulcers noted.  No changes consistent with cellulitis. Lymph : No lichenification or skin changes of chronic lymphedema.  CBC Lab Results  Component Value Date   WBC 12.1 (H) 07/01/2013   HGB 12.9 07/01/2013   HCT 39.6 07/01/2013   MCV 83 07/01/2013   PLT 228 07/01/2013    BMET    Component Value Date/Time   NA 139 07/01/2013 1721   K 4.6 07/01/2013 1721   CL 108 (H) 07/01/2013 1721   CO2 23 07/01/2013 1721   GLUCOSE 150 (H) 07/01/2013 1721   BUN 21 (H) 07/01/2013 1721   CREATININE 0.95 07/01/2013 1721   CALCIUM 11.2 (H) 07/01/2013 1721   GFRNONAA 58 (L) 07/01/2013 1721   GFRAA >60 07/01/2013 1721   CrCl cannot be calculated (Patient's most recent lab result is older than the maximum 21 days allowed.).  COAG No results found for: INR, PROTIME  Radiology No results found.  Assessment/Plan 1. Hematuria, unspecified type Patient needs a referral to Urology   A total of 35 minutes was spent with this patient and greater than 50% was spent in counseling and coordination of care with the patient.  Discussion included the  treatment options for vascular disease including indications for surgery and intervention.  Also discussed is the appropriate timing of treatment.  In addition medical therapy was discussed.  - Ambulatory referral to Urology  2. Post-phlebitic syndrome Recommend:   No surgery or intervention at this point in time.  IVC filter is not indicated at present.  Patient's duplex ultrasound of the venous system shows DVT from the popliteal to the femoral veins.  The patient is initiated on anticoagulation   Elevation was stressed, use of a recliner was discussed.  I have had a long discussion with the patient regarding DVT and post phlebitic changes such as swelling and why it  causes symptoms such as pain.  The patient will wear graduated compression stockings class 1 (20-30 mmHg), beginning after three full days of anticoagulation, on a daily basis a prescription was given. The patient will  beginning wearing the stockings first thing in the morning and removing them in the evening. The patient is instructed specifically not to sleep in the stockings.  In addition, behavioral modification including elevation during the day and avoidance of prolonged dependency will be initiated.    The patient will continue anticoagulation for now as there have not been any problems or complications at this point.    3. Diarrhea, unspecified type I will have her evaluated by GI   A total of 35 minutes was spent with this patient and greater than 50% was spent in counseling and coordination of care with the patient.  Discussion included the treatment options for vascular disease including indications for surgery and intervention.  Also discussed is the appropriate timing of treatment.  In addition medical therapy was discussed.  - Ambulatory referral to Gastroenterology   4. Right leg swelling See #2  5. Essential hypertension Continue antihypertensive medications as already ordered, these medications have  been reviewed and there are no changes at this time.      Hortencia Pilar, MD  01/12/2017 1:18 PM

## 2017-01-16 DIAGNOSIS — R197 Diarrhea, unspecified: Secondary | ICD-10-CM | POA: Insufficient documentation

## 2017-01-19 NOTE — Progress Notes (Signed)
01/20/2017 10:18 AM   Julie Acosta 12/30/1933 [id number]  Referring provider: Maryland Pink, MD [address] [facility] [place], [place] [address]  Chief Complaint  Patient presents with  . Hematuria    referred by Dr. Hortencia Pilar last seen in office 1/17    HPI: Patient is a 83 -year-old Caucasian female who presents today as a referral from their PCP, Dr. Hortencia Pilar, for gross hematuria.    She states that she's been having intermittent gross hematuria since July.   Patient underwent a hematuria work up in 2016 with CTU and cystoscopy.  Findings were bilateral renal cysts and small bilateral renal calculi.  She was also seen by nephrology and no additional etiologies were discovered for hematuria.     She does not have a prior history of recurrent urinary tract infections, nephrolithiasis, trauma to the genitourinary tract or malignancies of the genitourinary tract.   She does not have a family medical history of nephrolithiasis, malignancies of the genitourinary tract or hematuria.   Father had prostate cancer.  Today, she is having symptoms of urgency and nocturia.  Her UA today demonstrates > 30 RBC's.    She is experiencing any suprapubic pain, but she denies abdominal pain or flank pain.  She denies any recent fevers, chills, nausea or vomiting.   She is not a smoker.  They are/are not exposed to secondhand smoke.  They have/have not worked with Sports administrator, trichloroethylene, etc.   She has a high BMI.     PMH: Past Medical History:  Diagnosis Date  . Anxiety   . Benign breast lumps   . Blood clotting disorder ([place])   . Bone cancer ([place])   . Cervicalgia   . Colon cancer ([place])   . Complication of anesthesia    MOOD ALTERATION / UNSURE IF ANES OR PAIN MED. NO TROUBLE WITH MOH'S  . Cough    NASAL DRIP / SNEEZING / SORE THROAT MOSTLY CONSTANT  . Depression   . Diabetes ([place])   . Diverticulitis   . Dizzy   . GERD (gastroesophageal  reflux disease)   . HBP (high blood pressure)   . Hematuria    gross  . Hemorrhoids   . HLD (hyperlipidemia)   . Muscle pain   . Osteoarthritis   . Osteoporosis   . Panic disorder   . PND (post-nasal drip)    CHRONIC WITH SORE THROAT AND SNEEZING  . Reflux   . Skin cancer   . Swelling   . Tremors of nervous system     Surgical History: Past Surgical History:  Procedure Laterality Date  . APPENDECTOMY  1962  . BREAST BIOPSY Right 2006?   benign  . BREAST SURGERY Right   . CARPAL TUNNEL RELEASE Right   . CATARACT EXTRACTION W/PHACO Left 11/11/2016   Procedure: CATARACT EXTRACTION PHACO AND INTRAOCULAR LENS PLACEMENT (IOC);  Surgeon: Birder Robson, MD;  Location: [facility] ORS;  Service: Ophthalmology;  Laterality: Left;  [place] 01:07.9 AP% 19.2 CDE 13.02 Fluid pack lot # [id number]  . CATARACT EXTRACTION W/PHACO Right 12/09/2016   Procedure: CATARACT EXTRACTION PHACO AND INTRAOCULAR LENS PLACEMENT (IOC);  Surgeon: Birder Robson, MD;  Location: [facility] ORS;  Service: Ophthalmology;  Laterality: Right;  [place] 00:49 AP% 21.2 CDE 10.39 Fluid pack lot # [id number]  . CHOLECYSTECTOMY    . COLONOSCOPY WITH PROPOFOL N/A 11/22/2014   Procedure: COLONOSCOPY WITH PROPOFOL;  Surgeon: Manya Silvas, MD;  Location: [facility] ENDOSCOPY;  Service:  Endoscopy;  Laterality: N/A;  . ESOPHAGOGASTRODUODENOSCOPY  11/22/2014   Procedure: ESOPHAGOGASTRODUODENOSCOPY (EGD);  Surgeon: Manya Silvas, MD;  [facility] ENDOSCOPY;  Service: Endoscopy;;  . GALLBLADDER SURGERY    . HEMIPELVIC Left   . LEG SURGERY Left    AMPUTATION  . OVARY SURGERY Right   . SAVORY DILATION  11/22/2014   Procedure: SAVORY DILATION;  Surgeon: Manya Silvas, MD;  Location: [facility] ENDOSCOPY;  Service: Endoscopy;;  . SKIN CANCER EXCISION    . TONSILLECTOMY      Home Medications:  Allergies as of 01/20/2017      Reactions   Amoxicillin    Upset stomach Has patient had a PCN reaction causing immediate rash, facial/tongue/throat  swelling, SOB or lightheadedness with hypotension: No Has patient had a PCN reaction causing severe rash involving mucus membranes or skin necrosis: No Has patient had a PCN reaction that required hospitalization: No Has patient had a PCN reaction occurring within the last 10 years: Yes If all of the above answers are "NO", then may proceed with Cephalosporin use.;   Cefuroxime    OK INTRACAMERALLY PER DR WLP, upset stomach   Ciprocinonide [fluocinolone]    unknown   Codeine Other (See Comments)   HALLUCINATIONS   Lipitor [atorvastatin]    Latex Rash   Rast test NEGATIVE      Medication List       Accurate as of 01/20/17 10:18 AM. Always use your most recent med list.          acetaminophen 500 MG tablet Commonly known as:  TYLENOL Take 500 mg by mouth daily as needed for moderate pain or headache.   baclofen 10 MG tablet Commonly known as:  LIORESAL Take 10 mg by mouth daily.   fenofibrate 160 MG tablet Take 160 mg by mouth daily.   GLIPIZIDE XL 2.5 MG 24 hr tablet Generic drug:  glipiZIDE Take 2.5 mg by mouth daily.   losartan 50 MG tablet Commonly known as:  COZAAR Take 50 mg by mouth daily.   metFORMIN 500 MG tablet Commonly known as:  GLUCOPHAGE Take 1,000 mg by mouth 2 (two) times daily.   metoprolol succinate 25 MG 24 hr tablet Commonly known as:  TOPROL-XL Take 12.5 mg by mouth at bedtime.   omeprazole 40 MG capsule Commonly known as:  PRILOSEC Take 40 mg by mouth at bedtime.   PARoxetine 40 MG tablet Commonly known as:  PAXIL Take 40 mg by mouth daily.   raloxifene 60 MG tablet Commonly known as:  EVISTA Take 60 mg by mouth daily.   SYSTANE OP Place 1 drop into the right eye 2 (two) times daily as needed (dry eyes).   XARELTO 20 MG Tabs tablet Generic drug:  rivaroxaban Take 20 mg by mouth daily.            Discharge Care Instructions        Start     Ordered   01/20/17 0000  Urinalysis, Complete     01/20/17 0904   01/20/17  0000  CULTURE, URINE COMPREHENSIVE     01/20/17 0904   01/20/17 0000  BUN+Creat     01/20/17 0904   01/20/17 0000  CT HEMATURIA WORKUP    Question Answer Comment  Reason for Exam (SYMPTOM  OR DIAGNOSIS REQUIRED) Gross hematuria   Preferred imaging location? [facility]      01/20/17 1017      Allergies:  Allergies  Allergen Reactions  . Amoxicillin  Upset stomach Has patient had a PCN reaction causing immediate rash, facial/tongue/throat swelling, SOB or lightheadedness with hypotension: No Has patient had a PCN reaction causing severe rash involving mucus membranes or skin necrosis: No Has patient had a PCN reaction that required hospitalization: No Has patient had a PCN reaction occurring within the last 10 years: Yes If all of the above answers are "NO", then may proceed with Cephalosporin use.;  . Cefuroxime     OK INTRACAMERALLY PER DR WLP, upset stomach  . Ciprocinonide [Fluocinolone]     unknown  . Codeine Other (See Comments)    HALLUCINATIONS  . Lipitor [Atorvastatin]   . Latex Rash    Rast test NEGATIVE    Family History: Family History  Problem Relation Age of Onset  . Breast cancer Paternal Aunt   . Ovarian cancer Sister   . Prostate cancer Father   . Stroke Mother   . Kidney cancer Neg Hx   . Bladder Cancer Neg Hx     Social History:  reports that she has never smoked. She has never used smokeless tobacco. She reports that she does not drink alcohol or use drugs.  ROS: UROLOGY Frequent Urination?: No Hard to postpone urination?: Yes Burning/pain with urination?: No Get up at night to urinate?: Yes Leakage of urine?: No Urine stream starts and stops?: No Trouble starting stream?: No Do you have to strain to urinate?: No Blood in urine?: Yes Urinary tract infection?: No Sexually transmitted disease?: No Injury to kidneys or bladder?: No Painful intercourse?: No Weak stream?: No Currently pregnant?: No Vaginal bleeding?: No Last  menstrual period?: n  Gastrointestinal Nausea?: No Vomiting?: No Indigestion/heartburn?: No Diarrhea?: No Constipation?: No  Constitutional Fever: No Night sweats?: No Weight loss?: No Fatigue?: No  Skin Skin rash/lesions?: No Itching?: No  Eyes Blurred vision?: No Double vision?: No  Ears/Nose/Throat Sore throat?: Yes Sinus problems?: No  Hematologic/Lymphatic Swollen glands?: No Easy bruising?: No  Cardiovascular Leg swelling?: Yes Chest pain?: No  Respiratory Cough?: No Shortness of breath?: No  Endocrine Excessive thirst?: No  Musculoskeletal Back pain?: No Joint pain?: No  Neurological Headaches?: No Dizziness?: No  Psychologic Depression?: No Anxiety?: No  Physical Exam: BP 100/62   Pulse 88   Ht 5' (1.524 m)   Wt 161 lb (73 kg)   BMI 31.44 kg/m   Constitutional: Well nourished. Alert and oriented, No acute distress. HEENT: [place] AT, moist mucus membranes. Trachea midline, no masses. Cardiovascular: No clubbing, cyanosis, or edema. Respiratory: Normal respiratory effort, no increased work of breathing. GI: Abdomen is soft, non tender, non distended, no abdominal masses. Liver and spleen not palpable.  No hernias appreciated.  Stool sample for occult testing is not indicated.   GU: No CVA tenderness.  No bladder fullness or masses.  Atrophic external genitalia, normal pubic hair distribution, no lesions.  Normal urethral meatus, no lesions, no prolapse, no discharge.   No urethral masses, tenderness and/or tenderness. No bladder fullness, tenderness or masses. Normal vagina mucosa, good estrogen effect, no discharge, no lesions, good pelvic support, no cystocele or rectocele noted.  No cervical motion tenderness.  Uterus is freely mobile and non-fixed.  No adnexal/parametria masses or tenderness noted.  Anus and perineum are without rashes or lesions.    Skin: No rashes, bruises or suspicious lesions. Lymph: No cervical or inguinal  adenopathy. Neurologic: Grossly intact, no focal deficits, moving all 3 extremities.  Left leg amputated.   Psychiatric: Normal mood and affect.  Laboratory Data:  Urinalysis > 30 RBC's.  See EPIC.    I have reviewed the labs  Pertinent Imaging: CLINICAL DATA:  Intermittent hematuria. History of left  hemipelviectomy for bone cancer. Prior cholecystectomy and  appendectomy.   EXAM:  CT ABDOMEN AND PELVIS WITHOUT AND WITH CONTRAST   TECHNIQUE:  Multidetector CT imaging of the abdomen and pelvis was performed  following the standard protocol before and following the bolus  administration of intravenous contrast.   CONTRAST:  125 mL Omnipaque 300 IV   COMPARISON:  None.   FINDINGS:  Lower chest:  Lung bases are essentially clear.   Hepatobiliary: Mild hepatic steatosis. No suspicious/enhancing  hepatic lesions.   Status post cholecystectomy. No intrahepatic or extrahepatic ductal  dilatation.   Pancreas: Within normal limits.   Spleen: Within normal limits.   Adrenals/Urinary Tract: Faint calcification along the right adrenal  gland (series 2/image 24), likely sequela of prior trauma or  infection. Left adrenal gland is within normal limits.   Small bilateral renal cysts measuring up to 6 mm in the lateral left  upper kidney (series 4/image 20). No enhancing renal lesions.   Small bilateral renal calculi measuring up to 3 mm in the right  upper pole (series 2/ image 35). No ureteral or bladder calculi. No  hydronephrosis.   On delayed imaging, there are no filling defects in the bilateral  opacified proximal collecting systems, ureters, or bladder.   Bladder is underdistended but unremarkable.   Stomach/Bowel: Stomach is unremarkable.   No evidence of bowel obstruction.   Prior appendectomy.   Colonic diverticulosis, without evidence of diverticulitis.   Vascular/Lymphatic: Atherosclerotic calcifications of the abdominal  aorta and  branch vessels.   No suspicious abdominopelvic lymphadenopathy.   Reproductive: Uterus is mildly heterogeneous, suggesting uterine  fibroids.   Bilateral ovaries are within normal limits.   Other: No abdominopelvic ascites.   Large left lateral abdominal wall hernia containing fat and multiple  loops of small and large bowel (series 4/ image 45), postsurgical.   Musculoskeletal: S-shaped thoracolumbar scoliosis with mild  degenerative changes.   Status post left hemipelviectomy.   IMPRESSION:  Small bilateral renal cysts measuring up to 6 mm in the left upper  kidney. No enhancing renal lesions.   Small bilateral renal calculi measuring up to 3 mm in right upper  pole. No ureteral or bladder calculi. No hydronephrosis.   Postsurgical changes related to cholecystectomy, appendectomy, and  left hemipelviectomy.    Electronically Signed    By: Julian Hy M.D.    On: 05/09/2014 16:02   Assessment & Plan:    1. Gross hematuria  - I explained to the patient that there are a number of causes that can be associated with blood in the urine, such as stones, UTI's, damage to the urinary tract and/or cancer.  - At this time, I felt that the patient warranted further urologic evaluation.   The AUA guidelines state that a CT urogram is the preferred imaging study to evaluate hematuria.  - I explained to the patient that a contrast material will be injected into a vein and that in rare instances, an allergic reaction can result and may even life threatening   The patient denies any allergies to contrast, iodine and/or seafood and is taking metformin.  - Her reproductive status is postmenopausal   - Following the imaging study,  I've recommended a cystoscopy. I described how this is performed, typically in an office setting with a flexible cystoscope. We described the  risks, benefits, and possible side effects, the most common of which is a minor amount of blood in the  urine and/or burning which usually resolves in 24 to 48 hours.    - The patient had the opportunity to ask questions which were answered. Based upon this discussion, the patient is willing to proceed. Therefore, I've ordered: a CT Urogram and cystoscopy.  - The patient will return following all of the above for discussion of the results.   - UA  - Urine culture  - BUN + creatinine      Return for CT Urogram report and cystoscopy.  These notes generated with voice recognition software. I apologize for typographical errors.  Zara Council, [facility] [address], [place], [place] [address] [phone]

## 2017-01-20 ENCOUNTER — Ambulatory Visit (INDEPENDENT_AMBULATORY_CARE_PROVIDER_SITE_OTHER): Payer: Medicare Other | Admitting: Urology

## 2017-01-20 ENCOUNTER — Encounter: Payer: Self-pay | Admitting: Urology

## 2017-01-20 VITALS — BP 100/62 | HR 88 | Ht 60.0 in | Wt 161.0 lb

## 2017-01-20 DIAGNOSIS — R310 Gross hematuria: Secondary | ICD-10-CM | POA: Diagnosis not present

## 2017-01-20 LAB — MICROSCOPIC EXAMINATION
EPITHELIAL CELLS (NON RENAL): NONE SEEN /HPF (ref 0–10)
WBC, UA: NONE SEEN /hpf (ref 0–?)

## 2017-01-20 LAB — URINALYSIS, COMPLETE
Bilirubin, UA: NEGATIVE
Glucose, UA: NEGATIVE
LEUKOCYTES UA: NEGATIVE
Nitrite, UA: NEGATIVE
PH UA: 5.5 (ref 5.0–7.5)
Urobilinogen, Ur: 0.2 mg/dL (ref 0.2–1.0)

## 2017-01-20 NOTE — Progress Notes (Signed)
Venipuncture: Left hand at 9:20 am

## 2017-01-21 ENCOUNTER — Ambulatory Visit (INDEPENDENT_AMBULATORY_CARE_PROVIDER_SITE_OTHER): Payer: Medicare Other | Admitting: Podiatry

## 2017-01-21 ENCOUNTER — Encounter: Payer: Self-pay | Admitting: Podiatry

## 2017-01-21 DIAGNOSIS — B351 Tinea unguium: Secondary | ICD-10-CM | POA: Diagnosis not present

## 2017-01-21 DIAGNOSIS — M79676 Pain in unspecified toe(s): Secondary | ICD-10-CM

## 2017-01-21 LAB — BUN+CREAT
BUN / CREAT RATIO: 39 — AB (ref 12–28)
BUN: 23 mg/dL (ref 8–27)
Creatinine, Ser: 0.59 mg/dL (ref 0.57–1.00)
GFR calc non Af Amer: 87 mL/min/{1.73_m2} (ref >59)
GFR, EST AFRICAN AMERICAN: 100 mL/min/{1.73_m2} (ref >59)

## 2017-01-21 NOTE — Progress Notes (Signed)
She presents today for follow-up of her routine  footcare. She elected to have her nails cut right side.  Objective: Pulses remain palpable no open lesions or wounds. Mild venous congestion. Toenails are long thick yellow dystrophic sharp incurvated nail margins.  Assessment: Painful onychomycosis or nail dystrophy 1 through 5 right foot.  Plan: Debridement toenails 1 through 5 right foot.

## 2017-01-23 LAB — CULTURE, URINE COMPREHENSIVE

## 2017-02-03 ENCOUNTER — Ambulatory Visit
Admission: RE | Admit: 2017-02-03 | Discharge: 2017-02-03 | Disposition: A | Discharge status: Home / Self Care | Payer: Medicare Other | Source: Ambulatory Visit | Attending: Urology | Admitting: Urology

## 2017-02-03 DIAGNOSIS — I700 Atherosclerosis of aorta: Secondary | ICD-10-CM | POA: Diagnosis not present

## 2017-02-03 DIAGNOSIS — R310 Gross hematuria: Secondary | ICD-10-CM | POA: Diagnosis not present

## 2017-02-03 DIAGNOSIS — N200 Calculus of kidney: Secondary | ICD-10-CM | POA: Insufficient documentation

## 2017-02-03 DIAGNOSIS — R918 Other nonspecific abnormal finding of lung field: Secondary | ICD-10-CM | POA: Insufficient documentation

## 2017-02-03 MED ORDER — IOPAMIDOL (ISOVUE-300) INJECTION 61%
125.0000 mL | Freq: Once | INTRAVENOUS | Status: AC | PRN
Start: 2017-02-03 — End: 2017-02-03
  Administered 2017-02-03: 125 mL via INTRAVENOUS

## 2017-02-09 ENCOUNTER — Encounter: Payer: Self-pay | Admitting: Gastroenterology

## 2017-02-09 ENCOUNTER — Other Ambulatory Visit
Admission: RE | Admit: 2017-02-09 | Discharge: 2017-02-09 | Disposition: A | Discharge status: Home / Self Care | Payer: Medicare Other | Source: Ambulatory Visit | Attending: Gastroenterology | Admitting: Gastroenterology

## 2017-02-09 ENCOUNTER — Ambulatory Visit (INDEPENDENT_AMBULATORY_CARE_PROVIDER_SITE_OTHER): Payer: Medicare Other | Admitting: Gastroenterology

## 2017-02-09 VITALS — BP 120/69 | HR 65 | Temp 98.1°F | Ht 60.0 in | Wt 161.0 lb

## 2017-02-09 DIAGNOSIS — R1032 Left lower quadrant pain: Secondary | ICD-10-CM | POA: Diagnosis not present

## 2017-02-09 LAB — C-REACTIVE PROTEIN: CRP: 1.1 mg/dL — ABNORMAL HIGH (ref <1.0)

## 2017-02-09 LAB — CORTISOL: Cortisol, Plasma: 10.1 ug/dL

## 2017-02-09 NOTE — Progress Notes (Signed)
Cephas Darby, MD [address]  [place]  [place], [place] [address]  Main: [phone]  Fax: [phone]    Gastroenterology Consultation  Referring Provider:     Katha Cabal, MD Primary Care Physician:  Maryland Pink, MD Primary Gastroenterologist:  Dr. Cephas Darby Reason for Consultation:     Chronic diarrhea        HPI:   Julie Acosta is a 83 y.o. y/o female referred by Dr. Maryland Pink, MD  for consultation & management of Chronic diarrhea. She reports having diarrhea "All my life". 4 loose Bms in morning, before and after breakfast, non bloody, not a/w abdominal pain. She denies any nocturnal diarrhea, urgency or incontinence. She denies bloating. She denies nausea, vomiting, fever, chills. She reports that her weight has been stable. She has been taking Prilosec 40 mg for almost 20 years for GERD and she denies any heartburn currently. She was on 20 mg before, increased to 40 mg several years ago due to uncontrolled heartburn. She does not have anemia. She denies drinking carbonated beverages. Uses 2 artificial sweeteners with coffee daily  She has chronic left lower quadrant and flank discomfort and based on CT A/P revealed large hernia contaning small and large bowel. She is on Xarelto for history of the deep vein thrombosis. She had complete amputation of left leg secondary to bone cancer. She had mild hyperkalemia and was told to avoid high potassium containing foods. Most recent potassium levels have been normal.  GI Procedures:  EGD and colonoscopy by Dr. Vira Agar in 08/2014 DIAGNOSIS:  A. STOMACH, ANTRUM; COLD BIOPSY:  - ANTRAL MUCOSA WITH POLYPOID FOVEOLAR HYPERPLASIA, FOCAL INTESTINAL  METAPLASIA, AND MILD CHRONIC WITH FOCAL ACTIVE GASTRITIS (SEE NOTE).  - NEGATIVE FOR DYSPLASIA AND MALIGNANCY.  Immunohistochemical staining for H. pylori is negative with appropriate  control.   B. COLON POLYP, CECUM; COLD BIOPSY:  - TUBULAR ADENOMA.  -  NEGATIVE FOR HIGH-GRADE DYSPLASIA AND MALIGNANCY.   Past Medical History:  Diagnosis Date  . Anxiety   . Benign breast lumps   . Blood clotting disorder ([place])   . Bone cancer ([place])   . Cervicalgia   . Colon cancer ([place])   . Complication of anesthesia    MOOD ALTERATION / UNSURE IF ANES OR PAIN MED. NO TROUBLE WITH MOH'S  . Cough    NASAL DRIP / SNEEZING / SORE THROAT MOSTLY CONSTANT  . Depression   . Diabetes ([place])   . Diverticulitis   . Dizzy   . GERD (gastroesophageal reflux disease)   . HBP (high blood pressure)   . Hematuria    gross  . Hemorrhoids   . HLD (hyperlipidemia)   . Muscle pain   . Osteoarthritis   . Osteoporosis   . Panic disorder   . PND (post-nasal drip)    CHRONIC WITH SORE THROAT AND SNEEZING  . Reflux   . Skin cancer   . Swelling   . Tremors of nervous system     Past Surgical History:  Procedure Laterality Date  . APPENDECTOMY  1962  . BREAST BIOPSY Right 2006?   benign  . BREAST SURGERY Right   . CARPAL TUNNEL RELEASE Right   . CATARACT EXTRACTION W/PHACO Left 11/11/2016   Procedure: CATARACT EXTRACTION PHACO AND INTRAOCULAR LENS PLACEMENT (IOC);  Surgeon: Birder Robson, MD;  Location: [facility] ORS;  Service: Ophthalmology;  Laterality: Left;  [place] 01:07.9 AP% 19.2 CDE 13.02 Fluid pack lot # [id number]  . CATARACT  EXTRACTION W/PHACO Right 12/09/2016   Procedure: CATARACT EXTRACTION PHACO AND INTRAOCULAR LENS PLACEMENT (IOC);  Surgeon: Birder Robson, MD;  Location: [facility] ORS;  Service: Ophthalmology;  Laterality: Right;  [place] 00:49 AP% 21.2 CDE 10.39 Fluid pack lot # [id number]  . CHOLECYSTECTOMY    . COLONOSCOPY WITH PROPOFOL N/A 11/22/2014   Procedure: COLONOSCOPY WITH PROPOFOL;  Surgeon: Manya Silvas, MD;  Location[facility] ENDOSCOPY;  Service: Endoscopy;  Laterality: N/A;  . ESOPHAGOGASTRODUODENOSCOPY  11/22/2014   Procedure: ESOPHAGOGASTRODUODENOSCOPY (EGD);  Surgeon: Manya Silvas, MD;  Location: [facility] ENDOSCOPY;  Service: Endoscopy;;  .  GALLBLADDER SURGERY    . HEMIPELVIC Left   . LEG SURGERY Left    AMPUTATION  . OVARY SURGERY Right   . SAVORY DILATION  11/22/2014   Procedure: SAVORY DILATION;  Surgeon: Manya Silvas, MD;  Location[facility] ENDOSCOPY;  Service: Endoscopy;;  . SKIN CANCER EXCISION    . TONSILLECTOMY      Prior to Admission medications   Medication Sig Start Date End Date Taking? Authorizing Provider  baclofen (LIORESAL) 10 MG tablet Take 10 mg by mouth daily.  07/06/13  Yes [provider]  fenofibrate 160 MG tablet Take 160 mg by mouth daily.  05/09/13  Yes [provider]  GLIPIZIDE XL 2.5 MG 24 hr tablet Take 2.5 mg by mouth daily.  05/09/13  Yes [provider]  losartan (COZAAR) 50 MG tablet Take 50 mg by mouth daily.  03/29/13  Yes [provider]  metFORMIN (GLUCOPHAGE) 500 MG tablet Take 1,000 mg by mouth 2 (two) times daily.  03/15/13  Yes [provider]  metoprolol succinate (TOPROL-XL) 25 MG 24 hr tablet Take 12.5 mg by mouth at bedtime.  04/18/13  Yes [provider]  omeprazole (PRILOSEC) 40 MG capsule Take 40 mg by mouth at bedtime.  04/12/15  Yes [provider]  PARoxetine (PAXIL) 40 MG tablet Take 40 mg by mouth daily.  02/28/13  Yes [provider]  Polyethyl Glycol-Propyl Glycol (SYSTANE OP) Place 1 drop into the right eye 2 (two) times daily as needed (dry eyes).   Yes [provider]  raloxifene (EVISTA) 60 MG tablet Take 60 mg by mouth daily.  05/02/13  Yes [provider]  XARELTO 20 MG TABS tablet Take 20 mg by mouth daily.  07/04/13  Yes [provider]    Family History  Problem Relation Age of Onset  . Breast cancer Paternal Aunt   . Ovarian cancer Sister   . Prostate cancer Father   . Stroke Mother   . Kidney cancer Neg Hx   . Bladder Cancer Neg Hx      Social History  Substance Use Topics  . Smoking status: Never Smoker  . Smokeless tobacco: Never Used  . Alcohol use No     Allergies as of 02/09/2017 - Review Complete 02/09/2017  Allergen Reaction Noted  . Amoxicillin  11/21/2014  . Cefuroxime  12/03/2016  . Ciprocinonide [fluocinolone]  11/22/2014  . Codeine Other (See Comments) 02/21/2013  . Lipitor [atorvastatin]  11/21/2014  . Latex Rash 11/22/2014    Review of Systems:    All systems reviewed and negative except where noted in HPI.   Physical Exam:  BP 120/69   Pulse 65   Temp 98.1 F (36.7 C) (Oral)   Ht 5' (1.524 m)   Wt 161 lb (73 kg)   BMI 31.44 kg/m  No LMP recorded. Patient is postmenopausal.  General:   Alert,  Well-developed, well-nourished, pleasant and cooperative in NAD Head:  Normocephalic and atraumatic. Eyes:  Sclera clear, no icterus.   Conjunctiva pink. Ears:  Normal auditory acuity. Nose:  No deformity, discharge, or lesions. Mouth:  No deformity or lesions,oropharynx pink & moist. Neck:  Supple; no masses or thyromegaly. Lungs:  Respirations even and unlabored.  Clear throughout to auscultation.   No wheezes, crackles, or rhonchi. No acute distress. Heart:  Regular rate and rhythm; no murmurs, clicks, rubs, or gallops. Abdomen:  Normal bowel sounds.  No bruits.  Soft, non-tender and non-distended without masses, hepatosplenomegaly noted.  No guarding or rebound tenderness.  Fullness in left flank secondary to large ventral hernia Rectal: Nor performed Msk:  Wheelchair secondary to left pelvicectomy, right lower extremity with no edema or cyanosis Pulses:  Normal pulses noted. Extremities:  No clubbing or edema.  No cyanosis. Neurologic:  Alert and oriented x3;  grossly normal neurologically. Skin:  Intact without significant lesions or rashes. No jaundice. Lymph Nodes:  No significant cervical adenopathy. Psych:  Alert and cooperative. Normal mood and affect.  Imaging Studies: CT A/P 05/2014 Hepatobiliary: Mild hepatic steatosis. No suspicious/enhancing  hepatic lesions.   Status post cholecystectomy. No  intrahepatic or extrahepatic ductal  dilatation.  Large left lateral abdominal wall hernia containing fat and multiple  loops of small and large bowel (series 4/ image 45), postsurgical.   Musculoskeletal: S-shaped thoracolumbar scoliosis with mild  degenerative changes.   Status post left hemipelviectomy.   Assessment and Plan:   JANDI SWIGER is a 83 y.o. female with Chronic nonbloody diarrhea with no other alarm signs or symptoms. Differentials include microscopic colitis secondary to long-term PPI use or irritable bowel syndrome or celiac disease or functional noninfectious diarrhea or bacterial overgrowth or chronic infectious diarrhea like giardiasis or less likely inflammatory bowel disease.   Chronic nonbloody diarrhea: - GI pathogen panel for chronic infection - Check CRP, TTG, total IgA, check pancreatic fecal elastase, fecal lactoferrin - Check cortisol secondary to h/o mild isolated hyperkalemia - Decrease Prilosec to 20 mg daily - Imodium as needed  Focal intestinal metaplasia on gastric biopsies from 08/2014: - H pylori stain is negative on gastric biopsies - However, I would like to check H. pylori serology and if positive would empirically treat for H. pylori infection given history of focal intestinal metaplasia - Repeat EGD based on H. pylori serology for gastric mapping - She does not have anemia  Chronic left lower quadrant, flank discomfort: Secondary to large left abdominal hernia post surgical  Follow up in 4 weeks   Cephas Darby, MD

## 2017-02-10 LAB — H. PYLORI ANTIBODY, IGG

## 2017-02-10 LAB — IGA: IGA: 172 mg/dL (ref 64–422)

## 2017-02-12 ENCOUNTER — Encounter: Payer: Self-pay | Admitting: Urology

## 2017-02-12 ENCOUNTER — Telehealth: Payer: Self-pay | Admitting: Urology

## 2017-02-12 ENCOUNTER — Telehealth: Payer: Self-pay

## 2017-02-12 ENCOUNTER — Other Ambulatory Visit
Admission: RE | Admit: 2017-02-12 | Discharge: 2017-02-12 | Disposition: A | Discharge status: Home / Self Care | Payer: Medicare Other | Source: Ambulatory Visit | Attending: Gastroenterology | Admitting: Gastroenterology

## 2017-02-12 ENCOUNTER — Other Ambulatory Visit: Payer: Self-pay

## 2017-02-12 ENCOUNTER — Ambulatory Visit (INDEPENDENT_AMBULATORY_CARE_PROVIDER_SITE_OTHER): Payer: Medicare Other | Admitting: Urology

## 2017-02-12 VITALS — BP 111/54 | HR 96 | Ht 60.0 in | Wt 161.0 lb

## 2017-02-12 DIAGNOSIS — R310 Gross hematuria: Secondary | ICD-10-CM

## 2017-02-12 DIAGNOSIS — K31A0 Gastric intestinal metaplasia, unspecified: Secondary | ICD-10-CM

## 2017-02-12 DIAGNOSIS — R1032 Left lower quadrant pain: Secondary | ICD-10-CM | POA: Diagnosis present

## 2017-02-12 DIAGNOSIS — K3189 Other diseases of stomach and duodenum: Secondary | ICD-10-CM

## 2017-02-12 DIAGNOSIS — N200 Calculus of kidney: Secondary | ICD-10-CM

## 2017-02-12 LAB — LACTOFERRIN, FECAL, QUALITATIVE: Lactoferrin, Fecal, Qual: NEGATIVE

## 2017-02-12 LAB — URINALYSIS, COMPLETE
Bilirubin, UA: NEGATIVE
Glucose, UA: NEGATIVE
Ketones, UA: NEGATIVE
LEUKOCYTES UA: NEGATIVE
NITRITE UA: NEGATIVE
PH UA: 6 (ref 5.0–7.5)
PROTEIN UA: NEGATIVE
SPEC GRAV UA: 1.02 (ref 1.005–1.030)
Urobilinogen, Ur: 0.2 mg/dL (ref 0.2–1.0)

## 2017-02-12 MED ORDER — CIPROFLOXACIN HCL 500 MG PO TABS
500.0000 mg | ORAL_TABLET | Freq: Once | ORAL | Status: DC
Start: 2017-02-12 — End: 2017-07-27

## 2017-02-12 MED ORDER — LIDOCAINE HCL 2 % EX GEL: 1.0000 "application " | Freq: Once | CUTANEOUS | Status: DC

## 2017-02-12 NOTE — Telephone Encounter (Signed)
Patient has been scheduled EGD for gastric mapping November 1st [facility].Explained to patient her previous EGD shows inflammation in stomach that can potentially increase her risk for developing gastric cancer.  Instructions will be mailed to her.  Thanks [employer]

## 2017-02-12 NOTE — Progress Notes (Signed)
   02/12/17  CC:  Chief Complaint  Patient presents with  . Cysto    HPI: The patient is an 83 year old female who presents today for completion of her gross hematuria workup.  CT was positive for a right 8 mm renal pelvis calculus and bilateral punctate calculi. No other significant findings were noted.   There were no vitals taken for this visit. NED. A&Ox3.   No respiratory distress   Abd soft, NT, ND Normal external genitalia with patent urethral meatus  Cystoscopy Procedure Note  Patient identification was confirmed, informed consent was obtained, and patient was prepped using Betadine solution.  Lidocaine jelly was administered per urethral meatus.    Preoperative abx where received prior to procedure.    Procedure: - Flexible cystoscope introduced, without any difficulty.   - Thorough search of the bladder revealed:    normal urethral meatus    normal urothelium    no stones    no ulcers     no tumors    no urethral polyps    no trabeculation  - Ureteral orifices were normal in position and appearance.  Post-Procedure: - Patient tolerated the procedure well  Assessment/ Plan:  1. Right renal calculus -I discussed treatment options with the patient including active surveillance, lithotripsy, and ureteroscopy. The patient has elected to proceed with active surveillance. She'll follow-up in one year with KUB prior. She was warned of return precautions if she were to develop signs and symptoms of obstructive uropathy in the future.  2. Gross hematuria -negative workup except for above  Nickie Retort, MD

## 2017-02-16 LAB — PANCREATIC ELASTASE, FECAL: Pancreatic Elastase-1, Stool: >500 ug Elast./g (ref >200)

## 2017-03-05 ENCOUNTER — Ambulatory Visit
Admission: RE | Admit: 2017-03-05 | Discharge: 2017-03-05 | Disposition: A | Discharge status: Home / Self Care | Payer: Medicare Other | Source: Ambulatory Visit | Attending: Gastroenterology | Admitting: Gastroenterology

## 2017-03-05 ENCOUNTER — Ambulatory Visit: Payer: Medicare Other | Admitting: Anesthesiology

## 2017-03-05 ENCOUNTER — Encounter
Admission: RE | Disposition: A | Discharge status: Home / Self Care | Payer: Self-pay | Source: Ambulatory Visit | Attending: Gastroenterology

## 2017-03-05 DIAGNOSIS — K2950 Unspecified chronic gastritis without bleeding: Secondary | ICD-10-CM | POA: Diagnosis not present

## 2017-03-05 DIAGNOSIS — F419 Anxiety disorder, unspecified: Secondary | ICD-10-CM | POA: Insufficient documentation

## 2017-03-05 DIAGNOSIS — K3189 Other diseases of stomach and duodenum: Secondary | ICD-10-CM | POA: Diagnosis not present

## 2017-03-05 DIAGNOSIS — F329 Major depressive disorder, single episode, unspecified: Secondary | ICD-10-CM | POA: Insufficient documentation

## 2017-03-05 DIAGNOSIS — Z87442 Personal history of urinary calculi: Secondary | ICD-10-CM | POA: Diagnosis not present

## 2017-03-05 DIAGNOSIS — Z823 Family history of stroke: Secondary | ICD-10-CM | POA: Insufficient documentation

## 2017-03-05 DIAGNOSIS — Z7901 Long term (current) use of anticoagulants: Secondary | ICD-10-CM | POA: Insufficient documentation

## 2017-03-05 DIAGNOSIS — Z85828 Personal history of other malignant neoplasm of skin: Secondary | ICD-10-CM | POA: Diagnosis not present

## 2017-03-05 DIAGNOSIS — Z91040 Latex allergy status: Secondary | ICD-10-CM | POA: Insufficient documentation

## 2017-03-05 DIAGNOSIS — Z9049 Acquired absence of other specified parts of digestive tract: Secondary | ICD-10-CM | POA: Diagnosis not present

## 2017-03-05 DIAGNOSIS — I10 Essential (primary) hypertension: Secondary | ICD-10-CM | POA: Insufficient documentation

## 2017-03-05 DIAGNOSIS — E785 Hyperlipidemia, unspecified: Secondary | ICD-10-CM | POA: Diagnosis not present

## 2017-03-05 DIAGNOSIS — Z8589 Personal history of malignant neoplasm of other organs and systems: Secondary | ICD-10-CM | POA: Insufficient documentation

## 2017-03-05 DIAGNOSIS — E119 Type 2 diabetes mellitus without complications: Secondary | ICD-10-CM | POA: Diagnosis not present

## 2017-03-05 DIAGNOSIS — Z79899 Other long term (current) drug therapy: Secondary | ICD-10-CM | POA: Diagnosis not present

## 2017-03-05 DIAGNOSIS — Z961 Presence of intraocular lens: Secondary | ICD-10-CM | POA: Insufficient documentation

## 2017-03-05 DIAGNOSIS — Z9841 Cataract extraction status, right eye: Secondary | ICD-10-CM | POA: Diagnosis not present

## 2017-03-05 DIAGNOSIS — K31A0 Gastric intestinal metaplasia, unspecified: Secondary | ICD-10-CM

## 2017-03-05 DIAGNOSIS — Z9842 Cataract extraction status, left eye: Secondary | ICD-10-CM | POA: Diagnosis not present

## 2017-03-05 DIAGNOSIS — Z89612 Acquired absence of left leg above knee: Secondary | ICD-10-CM | POA: Insufficient documentation

## 2017-03-05 DIAGNOSIS — Z6831 Body mass index (BMI) 31.0-31.9, adult: Secondary | ICD-10-CM | POA: Diagnosis not present

## 2017-03-05 DIAGNOSIS — Z9889 Other specified postprocedural states: Secondary | ICD-10-CM | POA: Diagnosis not present

## 2017-03-05 DIAGNOSIS — M1990 Unspecified osteoarthritis, unspecified site: Secondary | ICD-10-CM | POA: Diagnosis not present

## 2017-03-05 DIAGNOSIS — Z881 Allergy status to other antibiotic agents status: Secondary | ICD-10-CM | POA: Insufficient documentation

## 2017-03-05 DIAGNOSIS — Z888 Allergy status to other drugs, medicaments and biological substances status: Secondary | ICD-10-CM | POA: Insufficient documentation

## 2017-03-05 DIAGNOSIS — Z803 Family history of malignant neoplasm of breast: Secondary | ICD-10-CM | POA: Insufficient documentation

## 2017-03-05 DIAGNOSIS — K219 Gastro-esophageal reflux disease without esophagitis: Secondary | ICD-10-CM | POA: Diagnosis not present

## 2017-03-05 DIAGNOSIS — Z7984 Long term (current) use of oral hypoglycemic drugs: Secondary | ICD-10-CM | POA: Insufficient documentation

## 2017-03-05 DIAGNOSIS — Z85038 Personal history of other malignant neoplasm of large intestine: Secondary | ICD-10-CM | POA: Insufficient documentation

## 2017-03-05 DIAGNOSIS — Z8042 Family history of malignant neoplasm of prostate: Secondary | ICD-10-CM | POA: Insufficient documentation

## 2017-03-05 DIAGNOSIS — Z8041 Family history of malignant neoplasm of ovary: Secondary | ICD-10-CM | POA: Insufficient documentation

## 2017-03-05 DIAGNOSIS — Z885 Allergy status to narcotic agent status: Secondary | ICD-10-CM | POA: Insufficient documentation

## 2017-03-05 HISTORY — PX: ESOPHAGOGASTRODUODENOSCOPY (EGD) WITH PROPOFOL: SHX5813

## 2017-03-05 HISTORY — DX: Chronic kidney disease, unspecified: N18.9

## 2017-03-05 LAB — GLUCOSE, CAPILLARY: Glucose-Capillary: 121 mg/dL — ABNORMAL HIGH (ref 65–99)

## 2017-03-05 SURGERY — ESOPHAGOGASTRODUODENOSCOPY (EGD) WITH PROPOFOL
Anesthesia: General

## 2017-03-05 MED ORDER — OMEPRAZOLE 20 MG PO CPDR: 40.0000 mg | DELAYED_RELEASE_CAPSULE | Freq: Every day | ORAL | 1 refills | Status: DC

## 2017-03-05 MED ORDER — PROPOFOL 10 MG/ML IV BOLUS
INTRAVENOUS | Status: DC | PRN
  Administered 2017-03-05 (×3): 30 mg via INTRAVENOUS

## 2017-03-05 MED ORDER — SODIUM CHLORIDE 0.9 % IV SOLN
INTRAVENOUS | Status: DC
  Administered 2017-03-05 (×2): via INTRAVENOUS

## 2017-03-05 NOTE — H&P (Signed)
Cephas Darby, MD [address]  [place]  [place], [place] [address]  Main: [phone]  Fax: [phone] Pager: [phone]  Primary Care Physician:  Maryland Pink, MD Primary Gastroenterologist:  Dr. Cephas Darby  Pre-Procedure History & Physical: HPI:  Julie Acosta is a 83 y.o. female is here for an endoscopy.   Past Medical History:  Diagnosis Date  . Anxiety   . Benign breast lumps   . Blood clotting disorder ([place])   . Bone cancer ([place])   . Cervicalgia   . Colon cancer ([place])   . Complication of anesthesia    MOOD ALTERATION / UNSURE IF ANES OR PAIN MED. NO TROUBLE WITH MOH'S  . Cough    NASAL DRIP / SNEEZING / SORE THROAT MOSTLY CONSTANT  . Depression   . Diabetes ([place])   . Diverticulitis   . Dizzy   . GERD (gastroesophageal reflux disease)   . HBP (high blood pressure)   . Hematuria    gross  . Hemorrhoids   . HLD (hyperlipidemia)   . Muscle pain   . Osteoarthritis   . Osteoporosis   . Panic disorder   . PND (post-nasal drip)    CHRONIC WITH SORE THROAT AND SNEEZING  . Reflux   . Skin cancer   . Swelling   . Tremors of nervous system     Past Surgical History:  Procedure Laterality Date  . APPENDECTOMY  1962  . BREAST BIOPSY Right 2006?   benign  . BREAST SURGERY Right   . CARPAL TUNNEL RELEASE Right   . CATARACT EXTRACTION W/PHACO Left 11/11/2016   Procedure: CATARACT EXTRACTION PHACO AND INTRAOCULAR LENS PLACEMENT (IOC);  Surgeon: Birder Robson, MD;  Location: [facility] ORS;  Service: Ophthalmology;  Laterality: Left;  [place] 01:07.9 AP% 19.2 CDE 13.02 Fluid pack lot # [id number]  . CATARACT EXTRACTION W/PHACO Right 12/09/2016   Procedure: CATARACT EXTRACTION PHACO AND INTRAOCULAR LENS PLACEMENT (IOC);  Surgeon: Birder Robson, MD;  Location: [facility] ORS;  Service: Ophthalmology;  Laterality: Right;  [place] 00:49 AP% 21.2 CDE 10.39 Fluid pack lot # [id number]  . CHOLECYSTECTOMY    . COLONOSCOPY WITH PROPOFOL N/A 11/22/2014   Procedure:  COLONOSCOPY WITH PROPOFOL;  Surgeon: Manya Silvas, MD;  Location: [facility] ENDOSCOPY;  Service: Endoscopy;  Laterality: N/A;  . ESOPHAGOGASTRODUODENOSCOPY  11/22/2014   Procedure: ESOPHAGOGASTRODUODENOSCOPY (EGD);  Surgeon: Manya Silvas, MD;  Location: [facility] ENDOSCOPY;  Service: Endoscopy;;  . GALLBLADDER SURGERY    . HEMIPELVIC Left   . LEG SURGERY Left    AMPUTATION  . OVARY SURGERY Right   . SAVORY DILATION  11/22/2014   Procedure: SAVORY DILATION;  Surgeon: Manya Silvas, MD;  Location: [facility] ENDOSCOPY;  Service: Endoscopy;;  . SKIN CANCER EXCISION    . TONSILLECTOMY      Prior to Admission medications   Medication Sig Start Date End Date Taking? Authorizing Provider  baclofen (LIORESAL) 10 MG tablet Take 10 mg by mouth daily.  07/06/13   [provider]  fenofibrate 160 MG tablet Take 160 mg by mouth daily.  05/09/13   [provider]  GLIPIZIDE XL 2.5 MG 24 hr tablet Take 2.5 mg by mouth daily.  05/09/13   [provider]  losartan (COZAAR) 50 MG tablet Take 50 mg by mouth daily.  03/29/13   [provider]  metFORMIN (GLUCOPHAGE) 500 MG tablet Take 1,000 mg by mouth 2 (two) times daily.  03/15/13   [provider]  metoprolol  succinate (TOPROL-XL) 25 MG 24 hr tablet Take 12.5 mg by mouth at bedtime.  04/18/13   [provider]  omeprazole (PRILOSEC) 40 MG capsule Take 40 mg by mouth at bedtime.  04/12/15   [provider]  PARoxetine (PAXIL) 40 MG tablet Take 40 mg by mouth daily.  02/28/13   [provider]  Polyethyl Glycol-Propyl Glycol (SYSTANE OP) Place 1 drop into the right eye 2 (two) times daily as needed (dry eyes).    [provider]  raloxifene (EVISTA) 60 MG tablet Take 60 mg by mouth daily.  05/02/13   [provider]  XARELTO 20 MG TABS tablet Take 20 mg by mouth daily.  07/04/13   [provider]    Allergies as of 02/12/2017 - Review Complete 02/12/2017  Allergen Reaction  Noted  . Amoxicillin  11/21/2014  . Cefuroxime  12/03/2016  . Ciprocinonide [fluocinolone]  11/22/2014  . Codeine Other (See Comments) 02/21/2013  . Lipitor [atorvastatin]  11/21/2014  . Latex Rash 11/22/2014    Family History  Problem Relation Age of Onset  . Breast cancer Paternal Aunt   . Ovarian cancer Sister   . Prostate cancer Father   . Stroke Mother   . Kidney cancer Neg Hx   . Bladder Cancer Neg Hx     Social History   Social History  . Marital status: Divorced    Spouse name: N/A  . Number of children: N/A  . Years of education: N/A   Occupational History  . Not on file.   Social History Main Topics  . Smoking status: Never Smoker  . Smokeless tobacco: Never Used  . Alcohol use No  . Drug use: No  . Sexual activity: Not Currently   Other Topics Concern  . Not on file   Social History Narrative  . No narrative on file    Review of Systems: See HPI, otherwise negative ROS  Physical Exam: There were no vitals taken for this visit. General:   Alert,  pleasant and cooperative in NAD Head:  Normocephalic and atraumatic. Neck:  Supple; no masses or thyromegaly. Lungs:  Clear throughout to auscultation.    Heart:  Regular rate and rhythm. Abdomen:  Soft, nontender and nondistended. Normal bowel sounds, without guarding, and without rebound.   Neurologic:  Alert and  oriented x4;  grossly normal neurologically.  Impression/Plan: Julie Acosta is here for an endoscopy to be performed for focal intestinal metaplasia, for gastric mapping  Risks, benefits, limitations, and alternatives regarding  endoscopy have been reviewed with the patient.  Questions have been answered.  All parties agreeable.   Sherri Sear, MD  03/05/2017, 8:10 AM

## 2017-03-05 NOTE — Op Note (Addendum)
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 Kary Kos, MD (Referring MD) Medicines:            Monitored Anesthesia Care Complications:        No immediate complications. Estimated blood loss:                        Minimal. Procedure:            Pre-Anesthesia Assessment:                       - Prior to the procedure, a History and Physical was                        performed, and patient medications and allergies were                        reviewed. The patient is competent. The risks and                        benefits of the procedure and the sedation options and                        risks were discussed with the patient. All questions                        were answered and informed consent was obtained.                        Patient identification and proposed procedure were                        verified by the physician, the nurse, the                        anesthesiologist, the anesthetist and the technician in                        the pre-procedure area in the procedure room. Mental                        Status Examination: alert and oriented. Airway                        Examination: normal oropharyngeal airway and neck                        mobility. Respiratory Examination: clear to  auscultation. CV Examination: normal. Prophylactic                        Antibiotics: The patient does not require prophylactic                        antibiotics. Prior Anticoagulants: The patient has              taken Eliquis (apixaban), last dose was 5 days prior to                        procedure. ASA Grade Assessment: III - A patient with                        severe systemic disease. After reviewing the risks and                        benefits, the patient was deemed in satisfactory                        condition to undergo the procedure. The anesthesia plan                        was to use monitored anesthesia care (MAC). Immediately                        prior to administration of medications, the patient was                        re-assessed for adequacy to receive sedatives. The                        heart rate, respiratory rate, oxygen saturations, blood                        pressure, adequacy of pulmonary ventilation, and                        response to care were monitored throughout the                        procedure. The physical status of the patient was                        re-assessed after the procedure.                       After obtaining informed consent, the endoscope was                        passed under direct vision. Throughout the procedure,                        the patient's blood pressure, pulse, and oxygen                        saturations were monitored continuously. The Endoscope                        was introduced through the mouth, and advanced to the  second part of duodenum. The upper GI endoscopy was                        accomplished without difficulty. The patient tolerated                        the procedure fairly well. Findings:      The duodenal bulb and second portion of the duodenum were normal.      Diffuse nodular mucosa was found in the gastric fundus, in the gastric       body and in the gastric antrum.      The gastroesophageal junction and examined esophagus were normal. Impression:           - Normal duodenal bulb and second portion of the                        duodenum.                        - Nodular mucosa in the gastric body, antrum and fundus.                       - Normal gastroesophageal junction and esophagus.                       - Three random biopsies were obtained on the greater                        curvature of the gastric body, on the lesser curvature                        of the gastric body, at the incisura, on the greater                        curvature of the gastric antrum and on the lesser                        curvature of the gastric antrum. Recommendation:       - Await pathology results.                       - Discharge patient to home.                       - Resume previous diet today.                       - Continue present medications.                       - Resume Xarelto (rivaroxaban) at prior dose today.                        Refer to primary physician for further adjustment of                        therapy. Procedure Code(s):    --- Professional ---                       [phone], Esophagogastroduodenoscopy, flexible, transoral;  diagnostic, including collection of specimen(s) by                        brushing or washing, when performed (separate procedure) Diagnosis Code(s):    --- Professional ---                       K31.89, Other diseases of stomach and duodenum CPT copyright 2016 American Medical Association. All rights reserved. The codes documented in this report are preliminary and upon coder review may  be revised to meet current compliance requirements. Dr. Ulyess Mort Lin Landsman MD, MD 03/05/2017 9:54:35 AM This report has been signed electronically. Number of Addenda: 0 Note Initiated On: 03/05/2017 9:19 AM      [facility]

## 2017-03-05 NOTE — Anesthesia Postprocedure Evaluation (Signed)
Anesthesia Post Note  Patient: Julie Acosta  Procedure(s) Performed: ESOPHAGOGASTRODUODENOSCOPY (EGD) WITH PROPOFOL (N/A )  Patient location during evaluation: Endoscopy Anesthesia Type: General Level of consciousness: awake and alert Pain management: pain level controlled Vital Signs Assessment: post-procedure vital signs reviewed and stable Respiratory status: spontaneous breathing, nonlabored ventilation, respiratory function stable and patient connected to nasal cannula oxygen Cardiovascular status: blood pressure returned to baseline and stable Postop Assessment: no apparent nausea or vomiting Anesthetic complications: no     Last Vitals:  Vitals:   03/05/17 1000 03/05/17 1010  BP: (!) 114/42 (!) 104/43  Pulse: 79 73  Resp: (!) 24 (!) 26  Temp:    SpO2: 97% 100%    Last Pain:  Vitals:   03/05/17 0940  TempSrc: Tympanic                 Martha Clan

## 2017-03-05 NOTE — Transfer of Care (Signed)
Immediate Anesthesia Transfer of Care Note  Patient: Julie Acosta  Procedure(s) Performed: ESOPHAGOGASTRODUODENOSCOPY (EGD) WITH PROPOFOL (N/A )  Patient Location: PACU  Anesthesia Type:General  Level of Consciousness: sedated  Airway & Oxygen Therapy: Patient Spontanous Breathing and Patient connected to nasal cannula oxygen  Post-op Assessment: Report given to RN and Post -op Vital signs reviewed and stable  Post vital signs: Reviewed and stable  Last Vitals:  Vitals:   03/05/17 0859  BP: (!) 115/46  Pulse: 95  Resp: 18  Temp: 37.2 C  SpO2: 95%    Last Pain:  Vitals:   03/05/17 0859  TempSrc: Tympanic         Complications: No apparent anesthesia complications

## 2017-03-05 NOTE — Anesthesia Post-op Follow-up Note (Signed)
Anesthesia QCDR form completed.        

## 2017-03-05 NOTE — Anesthesia Preprocedure Evaluation (Signed)
Anesthesia Evaluation  Patient identified by MRN, date of birth, ID band Patient awake    Reviewed: Allergy & Precautions, H&P , NPO status , Patient's Chart, lab work & pertinent test results, reviewed documented beta blocker date and time   History of Anesthesia Complications (+) history of anesthetic complications  Airway Mallampati: III  TM Distance: >3 FB Neck ROM: full    Dental  (+) Caps, Dental Advidsory Given, Teeth Intact Permanent bridge:   Pulmonary neg pulmonary ROS,           Cardiovascular Exercise Tolerance: Good hypertension, (-) angina(-) CAD, (-) Past MI, (-) Cardiac Stents and (-) CABG (-) dysrhythmias (-) Valvular Problems/Murmurs     Neuro/Psych PSYCHIATRIC DISORDERS negative neurological ROS     GI/Hepatic Neg liver ROS, GERD  ,  Endo/Other  diabetes, Well Controlled, Type 2, Oral Hypoglycemic AgentsMorbid obesity  Renal/GU CRFRenal disease  negative genitourinary   Musculoskeletal   Abdominal   Peds  Hematology negative hematology ROS (+)   Anesthesia Other Findings Past Medical History: No date: Anxiety No date: Benign breast lumps No date: Blood clotting disorder ([facility]) No date: Bone cancer ([place]) No date: Cervicalgia No date: Chronic kidney disease     Comment:  kidney stones No date: Colon cancer ([place]) No date: Complication of anesthesia     Comment:  MOOD ALTERATION / UNSURE IF ANES OR PAIN MED. NO TROUBLE              WITH MOH'S No date: Cough     Comment:  NASAL DRIP / SNEEZING / SORE THROAT MOSTLY CONSTANT No date: Depression No date: Diabetes ([place]) No date: Diverticulitis No date: Dizzy No date: GERD (gastroesophageal reflux disease) No date: HBP (high blood pressure) No date: Hematuria     Comment:  gross No date: Hemorrhoids No date: HLD (hyperlipidemia) No date: Muscle pain No date: Osteoarthritis No date: Osteoporosis No date: Panic disorder No date: PND  (post-nasal drip)     Comment:  CHRONIC WITH SORE THROAT AND SNEEZING No date: Reflux No date: Skin cancer No date: Swelling No date: Tremors of nervous system   Reproductive/Obstetrics negative OB ROS                             Anesthesia Physical Anesthesia Plan  ASA: III  Anesthesia Plan: General   Post-op Pain Management:    Induction: Intravenous  PONV Risk Score and Plan: 3 and Propofol infusion  Airway Management Planned: Nasal Cannula  Additional Equipment:   Intra-op Plan:   Post-operative Plan:   Informed Consent: I have reviewed the patients History and Physical, chart, labs and discussed the procedure including the risks, benefits and alternatives for the proposed anesthesia with the patient or authorized representative who has indicated his/her understanding and acceptance.   Dental Advisory Given  Plan Discussed with: Anesthesiologist, CRNA and Surgeon  Anesthesia Plan Comments:         Anesthesia Quick Evaluation

## 2017-03-06 ENCOUNTER — Encounter: Payer: Self-pay | Admitting: Gastroenterology

## 2017-03-06 LAB — SURGICAL PATHOLOGY

## 2017-03-09 ENCOUNTER — Ambulatory Visit: Payer: Medicare Other | Admitting: Gastroenterology

## 2017-03-13 ENCOUNTER — Encounter: Payer: Self-pay | Admitting: Gastroenterology

## 2017-03-13 ENCOUNTER — Ambulatory Visit: Payer: Medicare Other | Admitting: Gastroenterology

## 2017-03-20 ENCOUNTER — Ambulatory Visit: Payer: Medicare Other | Admitting: Gastroenterology

## 2017-04-16 ENCOUNTER — Other Ambulatory Visit: Payer: Self-pay | Admitting: Family Medicine

## 2017-04-16 DIAGNOSIS — Z1231 Encounter for screening mammogram for malignant neoplasm of breast: Secondary | ICD-10-CM

## 2017-04-22 ENCOUNTER — Ambulatory Visit: Payer: Medicare Other | Admitting: Podiatry

## 2017-05-05 HISTORY — PX: BREAST SURGERY: SHX581

## 2017-05-05 HISTORY — PX: BREAST BIOPSY: SHX20

## 2017-05-05 HISTORY — PX: MOUTH SURGERY: SHX715

## 2017-05-11 ENCOUNTER — Ambulatory Visit (INDEPENDENT_AMBULATORY_CARE_PROVIDER_SITE_OTHER): Payer: Medicare Other | Admitting: Podiatry

## 2017-05-11 ENCOUNTER — Encounter: Payer: Self-pay | Admitting: Podiatry

## 2017-05-11 DIAGNOSIS — M79676 Pain in unspecified toe(s): Secondary | ICD-10-CM | POA: Diagnosis not present

## 2017-05-11 DIAGNOSIS — D689 Coagulation defect, unspecified: Secondary | ICD-10-CM

## 2017-05-11 DIAGNOSIS — B351 Tinea unguium: Secondary | ICD-10-CM | POA: Diagnosis not present

## 2017-05-11 NOTE — Progress Notes (Signed)
She presents today chief complaint of painful elongated toenails 1 through 5 of the right foot.  Objective: Pulses are palpable no open lesions or wounds right foot.  Nails are long thick yellow dystrophic with mycotic.  Assessment: Pain in limb secondary to onychomycosis 1 through 5 right.  Plan: Toenails 1 through 5 of the right foot.

## 2017-05-12 ENCOUNTER — Ambulatory Visit
Admission: RE | Admit: 2017-05-12 | Discharge: 2017-05-12 | Disposition: A | Discharge status: Home / Self Care | Payer: Medicare Other | Source: Ambulatory Visit | Attending: Family Medicine | Admitting: Family Medicine

## 2017-05-12 DIAGNOSIS — Z1231 Encounter for screening mammogram for malignant neoplasm of breast: Secondary | ICD-10-CM | POA: Diagnosis not present

## 2017-05-12 DIAGNOSIS — R928 Other abnormal and inconclusive findings on diagnostic imaging of breast: Secondary | ICD-10-CM | POA: Diagnosis not present

## 2017-05-12 DIAGNOSIS — N6320 Unspecified lump in the left breast, unspecified quadrant: Secondary | ICD-10-CM | POA: Insufficient documentation

## 2017-05-14 ENCOUNTER — Other Ambulatory Visit: Payer: Self-pay | Admitting: Family Medicine

## 2017-05-14 DIAGNOSIS — R928 Other abnormal and inconclusive findings on diagnostic imaging of breast: Secondary | ICD-10-CM

## 2017-05-14 DIAGNOSIS — N6320 Unspecified lump in the left breast, unspecified quadrant: Secondary | ICD-10-CM

## 2017-05-24 ENCOUNTER — Emergency Department: Payer: Medicare Other

## 2017-05-24 ENCOUNTER — Other Ambulatory Visit: Payer: Self-pay

## 2017-05-24 ENCOUNTER — Encounter: Payer: Self-pay | Admitting: Emergency Medicine

## 2017-05-24 ENCOUNTER — Emergency Department
Admission: EM | Admit: 2017-05-24 | Discharge: 2017-05-24 | Disposition: A | Discharge status: Home / Self Care | Payer: Medicare Other | Attending: Emergency Medicine | Admitting: Emergency Medicine

## 2017-05-24 DIAGNOSIS — Z79899 Other long term (current) drug therapy: Secondary | ICD-10-CM | POA: Insufficient documentation

## 2017-05-24 DIAGNOSIS — M79661 Pain in right lower leg: Secondary | ICD-10-CM | POA: Insufficient documentation

## 2017-05-24 DIAGNOSIS — Z91040 Latex allergy status: Secondary | ICD-10-CM | POA: Diagnosis not present

## 2017-05-24 DIAGNOSIS — Z85828 Personal history of other malignant neoplasm of skin: Secondary | ICD-10-CM | POA: Diagnosis not present

## 2017-05-24 DIAGNOSIS — Z7984 Long term (current) use of oral hypoglycemic drugs: Secondary | ICD-10-CM | POA: Diagnosis not present

## 2017-05-24 DIAGNOSIS — Z85038 Personal history of other malignant neoplasm of large intestine: Secondary | ICD-10-CM | POA: Diagnosis not present

## 2017-05-24 DIAGNOSIS — N189 Chronic kidney disease, unspecified: Secondary | ICD-10-CM | POA: Diagnosis not present

## 2017-05-24 DIAGNOSIS — I129 Hypertensive chronic kidney disease with stage 1 through stage 4 chronic kidney disease, or unspecified chronic kidney disease: Secondary | ICD-10-CM | POA: Diagnosis not present

## 2017-05-24 DIAGNOSIS — E1122 Type 2 diabetes mellitus with diabetic chronic kidney disease: Secondary | ICD-10-CM | POA: Diagnosis not present

## 2017-05-24 NOTE — ED Notes (Signed)
Pt verbalizes d/c understanding and follow up. Pt in NAD at time of departure, VS stable. Pt with friend. Pt unable to sign d/c due to topax malfnx

## 2017-05-24 NOTE — ED Provider Notes (Signed)
[facility] Emergency Department Provider Note   ____________________________________________   First MD Initiated Contact with Patient 05/24/17 1645     (approximate)  I have reviewed the triage vital signs and the nursing notes.   HISTORY  Chief Complaint Leg Pain    HPI Julie Acosta is a 84 y.o. female right lower leg discomfort for about 1 day  Patient reports that she had a hemipelvectomy in her left leg about age 58.  The past she has a history of some chronic kidney disease as well as 2 previous DVTs, but is now on Xarelto daily which she has been compliant with.  Yesterday she began experiencing discomfort around the right mid calf, a couple weeks before that she had noticed a small area of swelling to.  She reports she has an achy discomfort across the calf muscles.  No fall or injury.  Reports she has not fallen in about 20 years.  She is a wheelchair transfers with that, but reports discomfort in the right calf.  No fevers or chills.  No numbness tingling or weakness in the right leg or foot.  No pain in any particular joint except discomfort behind the right knee a few days ago and over the last couple weeks which seems to be getting better.  No nausea or vomiting.  No abdominal pain.  No other symptoms to report.  She wants to make sure she does not have a "blood clot".  She and her friend who is also retired family physician are both very pleasant, they feel that an ultrasound would be very reassuring if it did not show a blood clot they would suspect likely musculoskeletal strain possibly related to the frequent water aerobics that she does if no blood clot or Baker's cyst.  She has taken one Tylenol tablet yesterday and 1 today with a provide some relief.  She does not wish for any pain medication at this time and reports presently it is not hurting   Past Medical History:  Diagnosis Date  . Anxiety   . Benign breast lumps   . Blood  clotting disorder ([place])   . Bone cancer ([place])   . Cervicalgia   . Chronic kidney disease    kidney stones  . Colon cancer ([place])   . Complication of anesthesia    MOOD ALTERATION / UNSURE IF ANES OR PAIN MED. NO TROUBLE WITH MOH'S  . Cough    NASAL DRIP / SNEEZING / SORE THROAT MOSTLY CONSTANT  . Depression   . Diabetes ([place])   . Diverticulitis   . Dizzy   . GERD (gastroesophageal reflux disease)   . HBP (high blood pressure)   . Hematuria    gross  . Hemorrhoids   . HLD (hyperlipidemia)   . Muscle pain   . Osteoarthritis   . Osteoporosis   . Panic disorder   . PND (post-nasal drip)    CHRONIC WITH SORE THROAT AND SNEEZING  . Reflux   . Skin cancer   . Swelling   . Tremors of nervous system     Patient Active Problem List   Diagnosis Date Noted  . Diarrhea 01/16/2017  . Hematuria 01/12/2017  . Chronic diarrhea 01/12/2017  . 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 Right leg swelling 10/13/2016  . Microscopic hematuria 06/02/2015  . Benign breast lumps 05/30/2015  . Cancer ([place]) 05/30/2015  . Depression 05/30/2015  . Diabetes mellitus type 2, uncomplicated ([place]) 03/01/2535  . Hyperlipidemia, unspecified 05/30/2015  .  Hypertension 05/30/2015  . Osteoporosis, post-menopausal 05/30/2015  . Panic attacks 05/30/2015    Past Surgical History:  Procedure Laterality Date  . APPENDECTOMY  1962  . BREAST BIOPSY Right 2006?   benign  . BREAST SURGERY Right   . CARPAL TUNNEL RELEASE Right   . CATARACT EXTRACTION W/PHACO Left 11/11/2016   Procedure: CATARACT EXTRACTION PHACO AND INTRAOCULAR LENS PLACEMENT (IOC);  Surgeon: Birder Robson, MD;  Location: [facility] ORS;  Service: Ophthalmology;  Laterality: Left;  [place] 01:07.9 AP% 19.2 CDE 13.02 Fluid pack lot # [id number]  . CATARACT EXTRACTION W/PHACO Right 12/09/2016   Procedure: CATARACT EXTRACTION PHACO AND INTRAOCULAR LENS PLACEMENT (IOC);  Surgeon: Birder Robson, MD;  Location: [facility] ORS;  Service: Ophthalmology;   Laterality: Right;  [place] 00:49 AP% 21.2 CDE 10.39 Fluid pack lot # [id number]  . CHOLECYSTECTOMY    . COLONOSCOPY WITH PROPOFOL N/A 11/22/2014   Procedure: COLONOSCOPY WITH PROPOFOL;  Surgeon: Manya Silvas, MD;  Location: [facility] ENDOSCOPY;  Service: Endoscopy;  Laterality: N/A;  . ESOPHAGOGASTRODUODENOSCOPY  11/22/2014   Procedure: ESOPHAGOGASTRODUODENOSCOPY (EGD);  Surgeon: Manya Silvas, MD;  Location: [facility] ENDOSCOPY;  Service: Endoscopy;;  . ESOPHAGOGASTRODUODENOSCOPY (EGD) WITH PROPOFOL N/A 03/05/2017   Procedure: ESOPHAGOGASTRODUODENOSCOPY (EGD) WITH PROPOFOL;  Surgeon: Lin Landsman, MD;  Location: [facility] ENDOSCOPY;  Service: Gastroenterology;  Laterality: N/A;  . GALLBLADDER SURGERY    . hemi pelvectomy    . HEMIPELVIC Left   . LEG SURGERY Left    AMPUTATION  . OVARY SURGERY Right   . SAVORY DILATION  11/22/2014   Procedure: SAVORY DILATION;  Surgeon: Manya Silvas, MD;  Location: [facility] ENDOSCOPY;  Service: Endoscopy;;  . SKIN CANCER EXCISION    . TONSILLECTOMY      Prior to Admission medications   Medication Sig Start Date End Date Taking? Authorizing Provider  baclofen (LIORESAL) 10 MG tablet Take 10 mg by mouth daily.  07/06/13   [provider]  fenofibrate 160 MG tablet Take 160 mg by mouth daily.  05/09/13   [provider]  GLIPIZIDE XL 2.5 MG 24 hr tablet Take 2.5 mg by mouth daily.  05/09/13   [provider]  losartan (COZAAR) 50 MG tablet Take 50 mg by mouth daily.  03/29/13   [provider]  metFORMIN (GLUCOPHAGE) 500 MG tablet Take 1,000 mg by mouth 2 (two) times daily.  03/15/13   [provider]  metoprolol succinate (TOPROL-XL) 25 MG 24 hr tablet Take 12.5 mg by mouth at bedtime.  04/18/13   [provider]  omeprazole (PRILOSEC) 20 MG capsule Take 2 capsules (40 mg total) by mouth daily before breakfast. 03/05/17 04/04/17  Lin Landsman, MD  PARoxetine (PAXIL) 40 MG tablet Take 40 mg by mouth daily.   02/28/13   [provider]  Polyethyl Glycol-Propyl Glycol (SYSTANE OP) Place 1 drop into the right eye 2 (two) times daily as needed (dry eyes).    [provider]  raloxifene (EVISTA) 60 MG tablet Take 60 mg by mouth daily.  05/02/13   [provider]  XARELTO 20 MG TABS tablet Take 20 mg by mouth daily.  07/04/13   [provider]    Allergies Amoxicillin; Cefuroxime; Ciprocinonide [fluocinolone]; Codeine; Lipitor [atorvastatin]; and Latex  Family History  Problem Relation Age of Onset  . Breast cancer Paternal Aunt   . Ovarian cancer Sister   . Prostate cancer Father   . Stroke Mother   . Kidney cancer Neg Hx   .  Bladder Cancer Neg Hx     Social History Social History   Tobacco Use  . Smoking status: Never Smoker  . Smokeless tobacco: Never Used  Substance Use Topics  . Alcohol use: No  . Drug use: No    Review of Systems Constitutional: No fever/chills Eyes: No visual changes. ENT: No sore throat. Cardiovascular: Denies chest pain. Respiratory: Denies shortness of breath. Gastrointestinal: No abdominal pain.  Genitourinary: Negative for dysuria. Musculoskeletal: Negative for back pain.  See HPI Skin: Negative for rash. Neurological: Negative for headaches, focal weakness or numbness.    ____________________________________________   PHYSICAL EXAM:  VITAL SIGNS: ED Triage Vitals  Enc Vitals Group     BP 05/24/17 1629 (!) 134/50     Pulse Rate 05/24/17 1629 (!) 113     Resp 05/24/17 1629 16     Temp 05/24/17 1629 98.1 F (36.7 C)     Temp Source 05/24/17 1629 Oral     SpO2 05/24/17 1629 95 %     Weight 05/24/17 1629 162 lb (73.5 kg)     Height 05/24/17 1629 5' (1.524 m)     Head Circumference --      Peak Flow --      Pain Score 05/24/17 1628 0     Pain Loc --      Pain Edu? --      Excl. in [place]? --     Constitutional: Alert and oriented. Well appearing and in no acute distress. Eyes: Conjunctivae are  normal. Head: Atraumatic. Nose: No congestion/rhinnorhea. Mouth/Throat: Mucous membranes are moist. Neck: No stridor.   Cardiovascular: Normal rate at 90 bpm, regular rhythm. Grossly normal heart sounds.  Good peripheral circulation. Respiratory: Normal respiratory effort.  No retractions. Lungs CTAB. Gastrointestinal: Soft and nontender. No distention. Musculoskeletal:   Lower Extremities  No edema. Normal DP/PT pulses bilateral with good cap refill.  Normal neuro-motor function lower extremities bilateral.  RIGHT Right lower extremity demonstrates normal strength, good use of all muscles. No edema bruising or contusions of the right hip, right knee, right ankle. Full range of motion of the right lower extremity without pain except for some discomfort reported behind the right knee which does not show any erythema or effusion. No pain on axial loading. No evidence of trauma.  The right foot has some slight venous stasis changes with but very strong and intact dorsalis pedis and posterior tibial pulses.  Patient reports a slight purplish appearance which appears like that of venous stasis changes is chronic and unchanged.  He is able to wiggle the toes plantar and dorsiflex the right foot and ankle without difficulty.  No deficits with regard to neurologic exam.  Normal sensation.  Ranges the knee and hip well without discomfort or pain.  She does endorse some very slight tenderness in the region behind the right posterior knee and also along the right posterior calf without any venous cords or congestion.  There is trace edema in the right lower leg around the foot and ankle, patient reports this is chronic.  LEFT Surgically absent   Neurologic:  Normal speech and language. No gross focal neurologic deficits are appreciated.  Skin:  Skin is warm, dry and intact. No rash noted. Psychiatric: Mood and affect are normal. Speech and behavior are  normal.  ____________________________________________   LABS (all labs ordered are listed, but only abnormal results are displayed)  Labs Reviewed - No data to display ____________________________________________  EKG   ____________________________________________  RADIOLOGY  US  Venous Img Lower Unilateral Right  Result Date: 05/24/2017 CLINICAL DATA:  Right calf discomfort for 1 day with edema EXAM: Right LOWER EXTREMITY VENOUS DOPPLER ULTRASOUND TECHNIQUE: Gray-scale sonography with graded compression, as well as color Doppler and duplex ultrasound were performed to evaluate the lower extremity deep venous systems from the level of the common femoral vein and including the common femoral, femoral, profunda femoral, popliteal and calf veins including the posterior tibial, peroneal and gastrocnemius veins when visible. The superficial great saphenous vein was also interrogated. Spectral Doppler was utilized to evaluate flow at rest and with distal augmentation maneuvers in the common femoral, femoral and popliteal veins. COMPARISON:  11/18/2010, 08/07/2010 FINDINGS: Contralateral Common Femoral Vein: Not evaluated secondary to left lower extremity amputation. Common Femoral Vein: No evidence of thrombus. Normal compressibility, respiratory phasicity and response to augmentation. Saphenofemoral Junction: No evidence of thrombus. Normal compressibility and flow on color Doppler imaging. Profunda Femoral Vein: No evidence of thrombus. Normal compressibility and flow on color Doppler imaging. Femoral Vein: Noncompressible mid to distal femoral vein. Linear echogenic thrombus present within the mid to distal femoral vein. Some flow present on color Doppler imaging. Popliteal Vein: No evidence of thrombus.  Normal compressibility. Calf Veins: No evidence of thrombus. Normal compressibility and flow on color Doppler imaging. Peroneal vein not well visualized. IMPRESSION: Noncompressible right mid to  distal femoral vein with areas of increased linear echogenicity, similar distribution compared to 2012 lower extremity venous ultrasound; appearance suggests chronic thrombus in the right mid to distal femoral vein. No acute thrombus seen within the remainder of the right lower extremity vessels. Electronically Signed   By: Donavan Foil M.D.   On: 05/24/2017 18:12    Results reviewed, no evidence of an acute thrombus.  Appears chronic right lower extremity DVT.  ____________________________________________   PROCEDURES  Procedure(s) performed: None  Procedures  Critical Care performed: No  ____________________________________________   INITIAL IMPRESSION / ASSESSMENT AND PLAN / ED COURSE  Pertinent labs & imaging results that were available during my care of the patient were reviewed by me and considered in my medical decision making (see chart for details).  Right lower extremity discomfort, primarily behind the right knee and also the calf.  No evidence of any joint effusion, no erythema, no infectious symptoms.  I do not suspect any type of joint infection.  He does have a strong history of DVTs but is anticoagulated, for her I think the differential diagnosis certainly encompasses musculoskeletal etiology, possible strain related to water aerobics, but alternatively we need to evaluate and exclude DVT.  Strong arterial pulses no motor or neurologic deficits.  Possibly a Baker's cyst given the preceding discomfort she had behind the right knee which now seems to be improved though no cyst is seen at this time.  Case discussed with Dr. Lucky Cowboy vascular surgery, reviewed clinical history and imaging today.  He advises no change in therapy, continue on current medications.  Return precautions and treatment recommendations and follow-up discussed with the patient who is agreeable with the plan.       ____________________________________________   FINAL CLINICAL IMPRESSION(S) / ED  DIAGNOSES  Final diagnoses:  Right calf pain      NEW MEDICATIONS STARTED DURING THIS VISIT:  New Prescriptions   No medications on file     Note:  This document was prepared using Dragon voice recognition software and may include unintentional dictation errors.     Delman Kitten, MD 05/24/17 [phone]

## 2017-05-24 NOTE — ED Triage Notes (Signed)
C/O knee and ankle soreness/ ache and pain.  Patient states she has history of DVT and is concerned that she has a DVT.

## 2017-05-27 ENCOUNTER — Ambulatory Visit: Payer: Medicare Other

## 2017-05-27 ENCOUNTER — Other Ambulatory Visit: Payer: Medicare Other

## 2017-06-02 ENCOUNTER — Ambulatory Visit
Admission: RE | Admit: 2017-06-02 | Discharge: 2017-06-02 | Disposition: A | Discharge status: Home / Self Care | Payer: Medicare Other | Source: Ambulatory Visit | Attending: Family Medicine | Admitting: Family Medicine

## 2017-06-02 DIAGNOSIS — N6321 Unspecified lump in the left breast, upper outer quadrant: Secondary | ICD-10-CM | POA: Insufficient documentation

## 2017-06-02 DIAGNOSIS — N6320 Unspecified lump in the left breast, unspecified quadrant: Secondary | ICD-10-CM | POA: Diagnosis present

## 2017-06-02 DIAGNOSIS — R928 Other abnormal and inconclusive findings on diagnostic imaging of breast: Secondary | ICD-10-CM | POA: Insufficient documentation

## 2017-06-03 ENCOUNTER — Other Ambulatory Visit: Payer: Self-pay | Admitting: Family Medicine

## 2017-06-03 DIAGNOSIS — N6320 Unspecified lump in the left breast, unspecified quadrant: Secondary | ICD-10-CM

## 2017-06-03 DIAGNOSIS — R928 Other abnormal and inconclusive findings on diagnostic imaging of breast: Secondary | ICD-10-CM

## 2017-06-05 ENCOUNTER — Ambulatory Visit
Admission: RE | Admit: 2017-06-05 | Discharge: 2017-06-05 | Disposition: A | Discharge status: Home / Self Care | Payer: Medicare Other | Source: Ambulatory Visit | Attending: Family Medicine | Admitting: Family Medicine

## 2017-06-05 DIAGNOSIS — R928 Other abnormal and inconclusive findings on diagnostic imaging of breast: Secondary | ICD-10-CM

## 2017-06-05 DIAGNOSIS — N6320 Unspecified lump in the left breast, unspecified quadrant: Secondary | ICD-10-CM

## 2017-06-05 DIAGNOSIS — C50212 Malignant neoplasm of upper-inner quadrant of left female breast: Secondary | ICD-10-CM | POA: Diagnosis not present

## 2017-06-09 ENCOUNTER — Encounter: Payer: Self-pay | Admitting: *Deleted

## 2017-06-09 NOTE — Progress Notes (Signed)
  Oncology Nurse Navigator Documentation  Navigator Location: [facility] (06/09/17 1500)   )Navigator Encounter Type: Introductory phone call (06/09/17 1500)   Abnormal Finding Date: 06/02/17 (06/09/17 1500) Confirmed Diagnosis Date: 06/08/17 (06/09/17 1500)                                              Time Spent with Patient: 15 (06/09/17 1500)   Patient newly diagnosed with invasive breast cancer.  I would like to establish navigation services.  Left her a message to return my call.

## 2017-06-10 NOTE — Telephone Encounter (Signed)
Erroneous

## 2017-06-11 ENCOUNTER — Encounter: Payer: Self-pay | Admitting: *Deleted

## 2017-06-11 NOTE — Progress Notes (Signed)
  Oncology Nurse Navigator Documentation  Navigator Location: [facility] (06/11/17 1100)   )Navigator Encounter Type: Telephone (06/11/17 1100) Telephone: Outgoing Call (06/11/17 1100)                       Barriers/Navigation Needs: Coordination of Care (06/11/17 1100)   Interventions: Coordination of Care (06/11/17 1100)   Coordination of Care: Appts (06/11/17 1100)                  Time Spent with Patient: 60 (06/11/17 1100)   Spoke to patient and introduced to navigation services.  She would like to see Dr. Bary Castilla for her surgical consult.  I have scheduled her to see Dr. Bary Castilla on 06/12/17 @ 9:30.  She is to arrive at 9:00.  I have also scheduled her to see Dr. Grayland Ormond for medical oncology consult on 06/16/17 @ 1:30.  She is to bring a photo ID, insurance card, and all her meds to both appointments.  She would like educational literature.  She understands I will give that to her at her appointment.  She is to call if she has any questions or needs.

## 2017-06-12 ENCOUNTER — Ambulatory Visit (INDEPENDENT_AMBULATORY_CARE_PROVIDER_SITE_OTHER): Payer: Medicare Other | Admitting: General Surgery

## 2017-06-12 ENCOUNTER — Encounter: Payer: Self-pay | Admitting: General Surgery

## 2017-06-12 ENCOUNTER — Inpatient Hospital Stay: Payer: Self-pay

## 2017-06-12 VITALS — BP 130/60 | HR 107 | Resp 16 | Ht 60.0 in | Wt 161.0 lb

## 2017-06-12 DIAGNOSIS — Z170 Estrogen receptor positive status [ER+]: Secondary | ICD-10-CM | POA: Diagnosis not present

## 2017-06-12 DIAGNOSIS — N6321 Unspecified lump in the left breast, upper outer quadrant: Secondary | ICD-10-CM

## 2017-06-12 DIAGNOSIS — C50412 Malignant neoplasm of upper-outer quadrant of left female breast: Secondary | ICD-10-CM | POA: Diagnosis not present

## 2017-06-12 LAB — SURGICAL PATHOLOGY

## 2017-06-12 NOTE — Progress Notes (Signed)
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  INTERVAL HISTORY: Patient is an 84 year old female who was noted to have an abnormality on routine screening mammogram.  Subsequent ultrasound biopsy revealed the above-stated breast cancer.  She currently feels well and is asymptomatic.  She has no neurologic complaints.  She denies any recent fevers or illnesses.  She has a good appetite and denies weight loss.  She has no chest pain or shortness of breath.  She denies any nausea, vomiting, constipation, or diarrhea.  She has no urinary complaints.  Patient feels at her baseline and offers no specific complaints today.  REVIEW OF SYSTEMS:   Review of Systems  Constitutional: Negative.  Negative for fever, malaise/fatigue and weight loss.  Respiratory: Negative.  Negative for cough and shortness of breath.   Cardiovascular: Negative.  Negative for chest pain and leg swelling.  Gastrointestinal: Negative.  Negative for abdominal pain.  Genitourinary: Negative.  Negative for dysuria.  Musculoskeletal: Positive for joint pain.  Skin: Negative.  Negative for rash.  Neurological: Negative.  Negative for sensory change and weakness.  Psychiatric/Behavioral: Negative.  The patient is not nervous/anxious.     As per HPI. Otherwise, a complete review of systems is negative.  PAST MEDICAL HISTORY: Past Medical History:  Diagnosis Date  . Anxiety   . Benign breast lumps   . Blood clotting disorder ([place])   . Bone cancer ([place])   . Cervicalgia   . Chronic kidney disease    kidney stones  . Colon cancer ([place])   . Complication of anesthesia    MOOD ALTERATION / UNSURE IF ANES OR PAIN MED. NO  TROUBLE WITH MOH'S  . Cough    NASAL DRIP / SNEEZING / SORE THROAT MOSTLY CONSTANT  . Depression   . Diabetes ([place])   . Diverticulitis   . Dizzy   . GERD (gastroesophageal reflux disease)   . HBP (high blood pressure)   . Hematuria    gross  . Hemorrhoids   . HLD (hyperlipidemia)   . Muscle pain   . Osteoarthritis   . Osteoporosis   . Panic disorder   . PND (post-nasal drip)    CHRONIC WITH SORE THROAT AND SNEEZING  . Reflux   . Skin cancer   . Swelling   . Tremors of nervous system     PAST SURGICAL HISTORY: Past Surgical History:  Procedure Laterality Date  . APPENDECTOMY  1962  . BREAST BIOPSY Right 2006?   benign  . BREAST SURGERY Right 2019  . CARPAL TUNNEL RELEASE Right   . CATARACT EXTRACTION W/PHACO Left 11/11/2016   Procedure: CATARACT EXTRACTION PHACO AND INTRAOCULAR LENS PLACEMENT (IOC);  Surgeon: Birder Robson, MD;  Location: [facility] ORS;  Service: Ophthalmology;  Laterality: Left;  [place] 01:07.9 AP% 19.2 CDE 13.02 Fluid pack lot # [id number]  . CATARACT EXTRACTION W/PHACO Right 12/09/2016   Procedure: CATARACT EXTRACTION PHACO AND INTRAOCULAR LENS PLACEMENT (IOC);  Surgeon: Birder Robson, MD;  Location: [facility] ORS;  Service: Ophthalmology;  Laterality: Right;  [place] 00:49 AP% 21.2 CDE 10.39 Fluid pack lot # [id number]  . CHOLECYSTECTOMY    . COLONOSCOPY WITH PROPOFOL N/A 11/22/2014   Procedure: COLONOSCOPY WITH PROPOFOL;  Surgeon: Manya Silvas, MD;  Location: [facility] ENDOSCOPY;  Service: Endoscopy;  Laterality: N/A;  . ESOPHAGOGASTRODUODENOSCOPY  11/22/2014   Procedure: ESOPHAGOGASTRODUODENOSCOPY (EGD);  Surgeon: Manya Silvas, MD;  Location: [facility] ENDOSCOPY;  Service: Endoscopy;;  . ESOPHAGOGASTRODUODENOSCOPY (EGD) WITH PROPOFOL N/A 03/05/2017   Procedure: ESOPHAGOGASTRODUODENOSCOPY (EGD) WITH PROPOFOL;  Surgeon: Lin Landsman, MD;  Location: [facility]COPY;  Service: Gastroenterology;  Laterality: N/A;  . GALLBLADDER SURGERY    . hemi pelvectomy    .  HEMIPELVIC Left   . LEG SURGERY Left    AMPUTATION  . OVARY SURGERY Right   . SAVORY DILATION  11/22/2014   Procedure: SAVORY DILATION;  Surgeon: Manya Silvas, MD;  Location: [facility] ENDOSCOPY;  Service: Endoscopy;;  . SKIN CANCER EXCISION    . TONSILLECTOMY      FAMILY HISTORY: Family History  Problem Relation Age of Onset  . Breast cancer Paternal Aunt   . Ovarian cancer Sister   . Prostate cancer Father   . Stroke Mother   . Kidney cancer Neg Hx   . Bladder Cancer Neg Hx     ADVANCED DIRECTIVES (Y/N):  N  HEALTH MAINTENANCE: Social History   Tobacco Use  . Smoking status: Never Smoker  . Smokeless tobacco: Never Used  Substance Use Topics  . Alcohol use: No  . Drug use: No     Colonoscopy:  PAP:  Bone density:  Lipid panel:  Allergies  Allergen Reactions  . Amoxicillin     Upset stomach Has patient had a PCN reaction causing immediate rash, facial/tongue/throat swelling, SOB or lightheadedness with hypotension: No Has patient had a PCN reaction causing severe rash involving mucus membranes or skin necrosis: No Has patient had a PCN reaction that required hospitalization: No Has patient had a PCN reaction occurring within the last 10 years: Yes If all of the above answers are "NO", then may proceed with Cephalosporin use.;  . Cefuroxime     OK INTRACAMERALLY PER DR WLP, upset stomach  . Ciprocinonide [Fluocinolone]     unknown  . Codeine Other (See Comments)    HALLUCINATIONS  . Lipitor [Atorvastatin]   . Latex Rash    Rast test NEGATIVE    Current Outpatient Medications  Medication Sig Dispense Refill  . fenofibrate 160 MG tablet Take 160 mg by mouth daily.     Marland Kitchen GLIPIZIDE XL 2.5 MG 24 hr tablet Take 2.5 mg by mouth daily.     . Iron-Vitamin C (IRON 100/C PO) Take by mouth.    . losartan (COZAAR) 50 MG tablet Take 50 mg by mouth daily.     . metFORMIN (GLUCOPHAGE) 500 MG tablet Take 1,000 mg by mouth 2 (two) times daily.     . metoprolol succinate  (TOPROL-XL) 25 MG 24 hr tablet Take 12.5 mg by mouth at bedtime.     Marland Kitchen PARoxetine (PAXIL) 40 MG tablet Take 40 mg by mouth daily.     Vladimir Faster Glycol-Propyl Glycol (SYSTANE OP) Place 1 drop into the right eye 2 (two) times daily as needed (dry eyes).    . raloxifene (EVISTA) 60 MG tablet Take 60 mg by mouth daily.     Alveda Reasons 20 MG TABS tablet Take 20 mg by mouth daily.     Marland Kitchen omeprazole (PRILOSEC) 20 MG capsule Take 2 capsules (40 mg total) by mouth daily before breakfast. 60 capsule 1   Current Facility-Administered Medications  Medication Dose Route Frequency Provider Last Rate Last Dose  . ciprofloxacin (CIPRO) tablet 500 mg  500  mg Oral Once Nickie Retort, MD      . lidocaine (XYLOCAINE) 2 % jelly 1 application  1 application Urethral Once Nickie Retort, MD        OBJECTIVE: Vitals:   06/16/17 1407  BP: 139/64  Pulse: (!) 105  Resp: 20  Temp: 98.4 F (36.9 C)     There is no height or weight on file to calculate BMI.    ECOG FS:0 - Asymptomatic  General: Well-developed, well-nourished, no acute distress. Eyes: Pink conjunctiva, anicteric sclera. HEENT: Normocephalic, moist mucous membranes, clear oropharnyx. Breasts: Patient requested exam be deferred today. Lungs: Clear to auscultation bilaterally. Heart: Regular rate and rhythm. No rubs, murmurs, or gallops. Abdomen: Soft, nontender, nondistended. No organomegaly noted, normoactive bowel sounds. Musculoskeletal: No edema, cyanosis, or clubbing. Neuro: Alert, answering all questions appropriately. Cranial nerves grossly intact. Skin: No rashes or petechiae noted. Psych: Normal affect. Lymphatics: No cervical, calvicular, axillary or inguinal LAD.   LAB RESULTS:  Lab Results  Component Value Date   NA 139 07/01/2013   K 4.6 07/01/2013   CL 108 (H) 07/01/2013   CO2 23 07/01/2013   GLUCOSE 150 (H) 07/01/2013   BUN 23 01/20/2017   CREATININE 0.59 01/20/2017   CALCIUM 11.2 (H) 07/01/2013   PROT 7.4  07/01/2013   ALBUMIN 3.6 07/01/2013   AST 16 07/01/2013   ALT 19 07/01/2013   ALKPHOS 104 07/01/2013   BILITOT 0.2 07/01/2013   GFRNONAA 87 01/20/2017   GFRAA 100 01/20/2017    Lab Results  Component Value Date   WBC 12.1 (H) 07/01/2013   HGB 12.9 07/01/2013   HCT 39.6 07/01/2013   MCV 83 07/01/2013   PLT 228 07/01/2013     STUDIES: US Venous Img Lower Unilateral Right  Result Date: 05/24/2017 CLINICAL DATA:  Right calf discomfort for 1 day with edema EXAM: Right LOWER EXTREMITY VENOUS DOPPLER ULTRASOUND TECHNIQUE: Gray-scale sonography with graded compression, as well as color Doppler and duplex ultrasound were performed to evaluate the lower extremity deep venous systems from the level of the common femoral vein and including the common femoral, femoral, profunda femoral, popliteal and calf veins including the posterior tibial, peroneal and gastrocnemius veins when visible. The superficial great saphenous vein was also interrogated. Spectral Doppler was utilized to evaluate flow at rest and with distal augmentation maneuvers in the common femoral, femoral and popliteal veins. COMPARISON:  11/18/2010, 08/07/2010 FINDINGS: Contralateral Common Femoral Vein: Not evaluated secondary to left lower extremity amputation. Common Femoral Vein: No evidence of thrombus. Normal compressibility, respiratory phasicity and response to augmentation. Saphenofemoral Junction: No evidence of thrombus. Normal compressibility and flow on color Doppler imaging. Profunda Femoral Vein: No evidence of thrombus. Normal compressibility and flow on color Doppler imaging. Femoral Vein: Noncompressible mid to distal femoral vein. Linear echogenic thrombus present within the mid to distal femoral vein. Some flow present on color Doppler imaging. Popliteal Vein: No evidence of thrombus.  Normal compressibility. Calf Veins: No evidence of thrombus. Normal compressibility and flow on color Doppler imaging. Peroneal vein not  well visualized. 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 No acute thrombus seen within the remainder of the right lower extremity vessels. Electronically Signed   By: Donavan Foil M.D.   On: 05/24/2017 18:12   US Breast Complete Uni Left Inc Axilla  Result  Date: 06/12/2017 Ultrasound examination showed a 0.68 x 0.69 x 0.76 cm hypoechoic mass 2 cm below the skin surface in the left breast at the 2 o'clock position, 4 cm from the nipple.  Medial to this at the 1 o'clock position is a dense area of calcification with posterior acoustic shadowing corresponding to a macro calcium deposit on her mammograms.  BI-RADS-6.   US Breast Ltd Uni Left Inc Axilla  Result Date: 06/02/2017 CLINICAL DATA:  84 year old patient recalled from recent screening mammogram for evaluation of a left breast mass. EXAM: 2D DIGITAL DIAGNOSTIC LEFT MAMMOGRAM WITH ADJUNCT TOMO ULTRASOUND LEFT BREAST COMPARISON:  05/12/2017 and earlier priors ACR Breast Density Category b: There are scattered areas of fibroglandular density. FINDINGS: Spot compression views of upper outer left breast confirm an irregular approximately 8 mm mass. On physical exam, no mass is palpated in the upper-outer quadrant of the left breast. Targeted ultrasound is performed, showing a hypoechoic irregular and angulated mass at 1 o'clock position 2 cm from nipple measuring 0.8 x 0.5 x 0.6 cm. No associated vascular flow is identified. Ultrasound of the left axilla shows normal lymph nodes with fatty hila and thin cortices. Negative for lymphadenopathy. IMPRESSION: 0.8 cm mass in the 1 o'clock position of the left breast is suspicious for malignancy. RECOMMENDATION: Ultrasound-guided core needle biopsy is recommended. The procedure for biopsy was discussed  with the patient today. I have discussed the findings and recommendations with the patient. Results were also provided in writing at the conclusion of the visit. If applicable, a reminder letter will be sent to the patient regarding the next appointment. BI-RADS CATEGORY  5: Highly suggestive of malignancy. Electronically Signed   By: Curlene Dolphin M.D.   On: 06/02/2017 12:25   Mm Diag Breast Tomo Uni Left  Result Date: 06/02/2017 CLINICAL DATA:  84 year old patient recalled from recent screening mammogram for evaluation of a left breast mass. EXAM: 2D DIGITAL DIAGNOSTIC LEFT MAMMOGRAM WITH ADJUNCT TOMO ULTRASOUND LEFT BREAST COMPARISON:  05/12/2017 and earlier priors ACR Breast Density Category b: There are scattered areas of fibroglandular density. FINDINGS: Spot compression views of upper outer left breast confirm an irregular approximately 8 mm mass. On physical exam, no mass is palpated in the upper-outer quadrant of the left breast. Targeted ultrasound is performed, showing a hypoechoic irregular and angulated mass at 1 o'clock position 2 cm from nipple measuring 0.8 x 0.5 x 0.6 cm. No associated vascular flow is identified. Ultrasound of the left axilla shows normal lymph nodes with fatty hila and thin cortices. Negative for lymphadenopathy. IMPRESSION: 0.8 cm mass in the 1 o'clock position of the left breast is suspicious for malignancy. RECOMMENDATION: Ultrasound-guided core needle biopsy is recommended. The procedure for biopsy was discussed with the patient today. I have discussed the findings and recommendations with the patient. Results were also provided in writing at the conclusion of the visit. If applicable, a reminder letter will be sent to the patient regarding the next appointment. BI-RADS CATEGORY  5: Highly suggestive of malignancy. Electronically Signed   By: Curlene Dolphin M.D.   On: 06/02/2017 12:25   Mm Clip Placement Left  Result Date: 06/05/2017 CLINICAL DATA:  Patient status post  ultrasound-guided biopsy left breast mass 1:00 position. EXAM: DIAGNOSTIC LEFT MAMMOGRAM POST ULTRASOUND BIOPSY COMPARISON:  Previous exam(s). FINDINGS: Mammographic images were obtained following ultrasound guided biopsy of left breast mass 1:00 position. Wing shaped marking clip in appropriate position. IMPRESSION: Appropriate position wing shaped marking clip status post ultrasound-guided biopsy  left breast mass 1:00 position. Final Assessment: Post Procedure Mammograms for Marker Placement Electronically Signed   By: Lovey Newcomer M.D.   On: 06/05/2017 09:53   [place] Lt Breast Bx W Loc Dev 1st Lesion Img Bx Spec US Guide  Addendum Date: 06/11/2017   ADDENDUM REPORT: 06/11/2017 16:36 ADDENDUM: Pathology of the left breast revealed A. BREAST, LEFT 1:00; ULTRASOUND GUIDED BIOPSY: INVASIVE MAMMARY CARCINOMA, NO SPECIAL TYPE. Size of invasive carcinoma: 8 mm in this sample. Histologic grade of invasive carcinoma: overall grade 2. This was found to be concordant with Dr. Rosana Hoes' notes and impression. Recommendation: Surgical consultation. The patient was contacted by phone by Dr. Enriqueta Shutter on 06/08/17. Results and recommendations were discussed with her and all of her questions were answered. A surgical referral was made with Dr. Bary Castilla for 06/12/17 at 9:30 AM and oncology referral with Dr. Grayland Ormond for 06/16/17 at 1:30 PM by Tanya Nones, RN, nurse navigator for [facility]. The patient was notified of the appointments by Tanya Nones, RN. Addendum by Jetta Lout, RRA on 06/11/17. Electronically Signed   By: Franki Cabot M.D.   On: 06/11/2017 16:36   Result Date: 06/11/2017 CLINICAL DATA:  Patient with indeterminate left breast mass 1:00 position. EXAM: ULTRASOUND GUIDED LEFT BREAST CORE NEEDLE BIOPSY COMPARISON:  Previous exam(s). FINDINGS: I met with the patient and we discussed the procedure of ultrasound-guided biopsy, including benefits and alternatives. We discussed the high likelihood of a  successful procedure. We discussed the risks of the procedure, including infection, bleeding, tissue injury, clip migration, and inadequate sampling. Informed written consent was given. The usual time-out protocol was performed immediately prior to the procedure. Lesion quadrant: Upper outer quadrant Using sterile technique and 1% Lidocaine as local anesthetic, under direct ultrasound visualization, a 12 gauge spring-loaded device was used to perform biopsy of left breast mass 1:00 position using a lateral approach. At the conclusion of the procedure a wing shaped tissue marker clip was deployed into the biopsy cavity. Follow up 2 view mammogram was performed and dictated separately. IMPRESSION: Ultrasound guided biopsy of left breast mass 1:00 position. No apparent complications. Electronically Signed: By: Lovey Newcomer M.D. 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  PLAN:    1. 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  Although patient is clinically stage I, she is not interested in chemotherapy if necessary.  Therefore, there is no need to send for MammoPrint Or Oncotype score.  Patient will have her lumpectomy in the next 1-2 weeks this will be followed by adjuvant XRT.  At the conclusion of all her treatments, patient will benefit from an aromatase inhibitor for 5 years.  Patient has been instructed to return to clinic approximately 2 weeks after her surgery to discuss the final pathology results and additional treatment planning.  Approximately 60 minutes was spent in discussion of which greater than 50% was consultation.  Patient expressed understanding  and was in agreement with this plan. She also understands that She can call clinic at any time with any questions, concerns, or complaints.   Cancer Staging Primary cancer of upper outer quadrant of left female breast [facility]) Staging form: Breast, AJCC 8th Edition -  Clinical stage from 06/12/2017: Stage IA (cT1b, cN0, cM0, G2, ER: Positive, PR: Positive, HER2: Negative) - Signed by Lloyd Huger, MD on 06/12/2017   Lloyd Huger, MD   06/19/2017 1:31 PM

## 2017-06-12 NOTE — Progress Notes (Signed)
Patient ID: [place], female   DOB: 1933-12-22, 84 y.o.   MRN: [id number]  Chief Complaint  Patient presents with  . Breast Cancer    HPI JANESA Acosta is a 84 y.o. female who presents for a breast evaluation. The most recent mammogram was done on .06/02/2017 and right breast biopsy on 06/05/2017.   Patient does perform regular self breast checks and gets regular mammograms done.   The patient was born and raised in [place].  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  2 adopted children, 1 daughter of lives in [place].  The patient lives independently at [facility].  Lives at [facility].  HPI  Past Medical History:  Diagnosis Date  . Anxiety   . Benign breast lumps   . Blood clotting disorder ([place])   . Bone cancer ([place])   . Cervicalgia   . Chronic kidney disease    kidney stones  . Colon cancer ([place])   . Complication of anesthesia    MOOD ALTERATION / UNSURE IF ANES OR PAIN MED. NO TROUBLE WITH MOH'S  . Cough    NASAL DRIP / SNEEZING / SORE THROAT MOSTLY CONSTANT  . Depression   . Diabetes ([place])   . Diverticulitis   . Dizzy   . GERD (gastroesophageal reflux disease)   . HBP (high blood pressure)   . Hematuria    gross  . Hemorrhoids   . HLD (hyperlipidemia)   . Muscle pain   . Osteoarthritis   . Osteoporosis   . Panic disorder   . PND (post-nasal drip)    CHRONIC WITH SORE THROAT AND SNEEZING  . Reflux   . Skin cancer   . Swelling   . Tremors of nervous system     Past Surgical History:  Procedure Laterality Date  . APPENDECTOMY  1962  . BREAST BIOPSY Right 2006?   benign  . BREAST SURGERY Right 2019  . CARPAL TUNNEL RELEASE Right   . CATARACT EXTRACTION W/PHACO Left 11/11/2016   Procedure: CATARACT EXTRACTION PHACO AND INTRAOCULAR LENS PLACEMENT (IOC);  Surgeon: Birder Robson, MD;  Location: [facility] ORS;  Service: Ophthalmology;  Laterality: Left;  [place] 01:07.9 AP% 19.2 CDE  13.02 Fluid pack lot # [id number]  . CATARACT EXTRACTION W/PHACO Right 12/09/2016   Procedure: CATARACT EXTRACTION PHACO AND INTRAOCULAR LENS PLACEMENT (IOC);  Surgeon: Birder Robson, MD;  Location: [facility] ORS;  Service: Ophthalmology;  Laterality: Right;  [place] 00:49 AP% 21.2 CDE 10.39 Fluid pack lot # [id number]  . CHOLECYSTECTOMY    . COLONOSCOPY WITH PROPOFOL N/A 11/22/2014   Procedure: COLONOSCOPY WITH PROPOFOL;  Surgeon: Manya Silvas, MD;  Location: [facility] ENDOSCOPY;  Service: Endoscopy;  Laterality: N/A;  . ESOPHAGOGASTRODUODENOSCOPY  11/22/2014   Procedure: ESOPHAGOGASTRODUODENOSCOPY (EGD);  Surgeon: Manya Silvas, MD;  Location: [facility] ENDOSCOPY;  Service: Endoscopy;;  . ESOPHAGOGASTRODUODENOSCOPY (EGD) WITH PROPOFOL N/A 03/05/2017   Procedure: ESOPHAGOGASTRODUODENOSCOPY (EGD) WITH PROPOFOL;  Surgeon: Lin Landsman, MD;  Location: [place] ENDOSCOPY;  Service: Gastroenterology;  Laterality: N/A;  . GALLBLADDER SURGERY    . hemi pelvectomy    . HEMIPELVIC Left   . LEG SURGERY Left    AMPUTATION  . OVARY SURGERY Right   . SAVORY DILATION  11/22/2014   Procedure: SAVORY DILATION;  Surgeon: Manya Silvas, MD;  Location: [place] ENDOSCOPY;  Service: Endoscopy;;  . SKIN CANCER EXCISION    . TONSILLECTOMY      Family  History  Problem Relation Age of Onset  . Breast cancer Paternal Aunt   . Ovarian cancer Sister   . Prostate cancer Father   . Stroke Mother   . Kidney cancer Neg Hx   . Bladder Cancer Neg Hx     Social History Social History   Tobacco Use  . Smoking status: Never Smoker  . Smokeless tobacco: Never Used  Substance Use Topics  . Alcohol use: No  . Drug use: No    Allergies  Allergen Reactions  . Amoxicillin     Upset stomach Has patient had a PCN reaction causing immediate rash, facial/tongue/throat swelling, SOB or lightheadedness with hypotension: No Has patient had a PCN reaction causing severe rash involving mucus membranes or skin necrosis: No Has  patient had a PCN reaction that required hospitalization: No Has patient had a PCN reaction occurring within the last 10 years: Yes If all of the above answers are "NO", then may proceed with Cephalosporin use.;  . Cefuroxime     OK INTRACAMERALLY PER DR WLP, upset stomach  . Ciprocinonide [Fluocinolone]     unknown  . Codeine Other (See Comments)    HALLUCINATIONS  . Lipitor [Atorvastatin]   . Latex Rash    Rast test NEGATIVE    Current Outpatient Medications  Medication Sig Dispense Refill  . GLIPIZIDE XL 2.5 MG 24 hr tablet Take 2.5 mg by mouth daily.     . Iron-Vitamin C (IRON 100/C PO) Take by mouth.    . losartan (COZAAR) 50 MG tablet Take 50 mg by mouth daily.     . metFORMIN (GLUCOPHAGE) 500 MG tablet Take 1,000 mg by mouth 2 (two) times daily.     . metoprolol succinate (TOPROL-XL) 25 MG 24 hr tablet Take 12.5 mg by mouth at bedtime.     Marland Kitchen PARoxetine (PAXIL) 40 MG tablet Take 40 mg by mouth daily.     Vladimir Faster Glycol-Propyl Glycol (SYSTANE OP) Place 1 drop into the right eye 2 (two) times daily as needed (dry eyes).    . raloxifene (EVISTA) 60 MG tablet Take 60 mg by mouth daily.     Alveda Reasons 20 MG TABS tablet Take 20 mg by mouth daily.     . fenofibrate 160 MG tablet Take 160 mg by mouth daily.     Marland Kitchen omeprazole (PRILOSEC) 20 MG capsule Take 2 capsules (40 mg total) by mouth daily before breakfast. 60 capsule 1   Current Facility-Administered Medications  Medication Dose Route Frequency Provider Last Rate Last Dose  . ciprofloxacin (CIPRO) tablet 500 mg  500 mg Oral Once Nickie Retort, MD      . lidocaine (XYLOCAINE) 2 % jelly 1 application  1 application Urethral Once Nickie Retort, MD        Review of Systems Review of Systems  Constitutional: Negative.   Respiratory: Negative.   Cardiovascular: Negative.     Blood pressure 130/60, pulse (!) 107, resp. rate 16, height 5' (1.524 m), weight 161 lb (73 kg).  Physical Exam Physical Exam   Constitutional: She is oriented to person, place, and time. She appears well-developed and well-nourished.  Eyes: Conjunctivae are normal. No scleral icterus.  Neck: Neck supple.  Cardiovascular: Normal rate, regular rhythm and normal heart sounds.  Pulmonary/Chest: Effort normal and breath sounds normal. Right breast exhibits no inverted nipple, no mass, no nipple discharge, no skin change and no tenderness. Left breast exhibits no inverted nipple, no mass, no nipple discharge, no  skin change and no tenderness.  Lymphadenopathy:    She has no cervical adenopathy.    She has no axillary adenopathy.  Neurological: She is alert and oriented to person, place, and time.  Skin: Skin is warm and dry.    Data Reviewed February 27, 2016 through June 05, 2017 mammogram and ultrasounds reviewed.   A. BREAST, LEFT 1:00; ULTRASOUND GUIDED BIOPSY:  - INVASIVE MAMMARY CARCINOMA, NO SPECIAL TYPE.   Size of invasive carcinoma: 8 mm in this sample  Histologic grade of invasive carcinoma: overall grade 2    Glandular/tubular differentiation score: 3    Nuclear pleomorphism score: 2    Mitotic rate score: 1    Total score: 6   Ductal carcinoma in situ: Not identified  Lymphovascular invasion: Not identified   Breast Biomarker Reporting Template   BREAST BIOMARKER TESTS  Estrogen Receptor (ER) Status: Positive, greater than 90%    Average intensity of staining: Moderate  Progesterone Receptor (PgR) Status: Positive, range of 51-90%    Average intensity of staining: Moderate  HER2 (by immunohistochemistry): Equivocal (2+)  HER2 FISH will be reported in an addendum.  Ultrasound examination of the left breast was undertaken to determine if preoperative wire localization would be required.  Ultrasound examination showed a 0.68 x 0.69 x 0.76 cm hypoechoic mass 2 cm below the skin surface in the left breast at the 2 o'clock position, 4 cm from the nipple.  Medial to this at the 1  o'clock position is a dense area of calcification with posterior acoustic shadowing corresponding to a macro calcium deposit on her mammograms.  BI-RADS-6.  Assessment    Left breast cancer.    Plan   The patient reports that both of her parents lived into their early-mid 28s.  We need to be looking at a treatment modality that will provide 10-15 years of protection.  In light of her family history of longevity, I think a sentinel node biopsy will be appropriate.  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  I anticipate her recovery from her breast cancer will be much quicker.  The majority of the visit was spent reviewing the options for breast cancer treatment. Breast conservation with lumpectomy and radiation therapy  was presented as equivalent to mastectomy for long-term control. The pros and cons of each treatment regimen were reviewed. The indications for additional therapy such as chemotherapy were touched on briefly, realizing that the majority of information required to determine if chemotherapy would be of benefit is not available at this time. The availability of consultation services for medical oncology and radiation oncology prior to surgery were reviewed.  At this time, the patient is interested in breast conservation.  As she is dependent on the use of a wheelchair/crutches/walker, preservation of upper extremity strength is important.  She is having some new problems with her right knee, and is seeing an orthopedist later this morning in this regard.  She is going to put off any surgery on her left breast until her knee allows her better mobility.  If it is anticipated this will be over a month, I would likely start her on an aromatase inhibitor while her knee situation resolves.  It HPI, Physical Exam, Assessment and Plan have been scribed under the direction and in the presence of Hervey Ard, MD.  Gaspar Cola,  CMA  I have completed the exam and reviewed the above documentation for accuracy and completeness.  I agree with the above.  Haematologist has been used and any errors in dictation or transcription are unintentional.  Hervey Ard, M.D., F.A.C.S.   Forest Gleason Byrnett 06/12/2017, 1:20 PM

## 2017-06-15 ENCOUNTER — Encounter: Payer: Self-pay | Admitting: General Surgery

## 2017-06-16 ENCOUNTER — Inpatient Hospital Stay: Payer: Medicare Other | Attending: Oncology | Admitting: Oncology

## 2017-06-16 ENCOUNTER — Encounter: Payer: Self-pay | Admitting: *Deleted

## 2017-06-16 VITALS — BP 139/64 | HR 105 | Temp 98.4°F | Resp 20

## 2017-06-16 DIAGNOSIS — F410 Panic disorder [episodic paroxysmal anxiety] without agoraphobia: Secondary | ICD-10-CM | POA: Diagnosis not present

## 2017-06-16 DIAGNOSIS — Z87442 Personal history of urinary calculi: Secondary | ICD-10-CM | POA: Diagnosis not present

## 2017-06-16 DIAGNOSIS — Z85038 Personal history of other malignant neoplasm of large intestine: Secondary | ICD-10-CM | POA: Diagnosis not present

## 2017-06-16 DIAGNOSIS — Z170 Estrogen receptor positive status [ER+]: Secondary | ICD-10-CM | POA: Diagnosis not present

## 2017-06-16 DIAGNOSIS — C50412 Malignant neoplasm of upper-outer quadrant of left female breast: Secondary | ICD-10-CM

## 2017-06-16 DIAGNOSIS — Z85828 Personal history of other malignant neoplasm of skin: Secondary | ICD-10-CM

## 2017-06-16 DIAGNOSIS — Z85830 Personal history of malignant neoplasm of bone: Secondary | ICD-10-CM

## 2017-06-16 DIAGNOSIS — M810 Age-related osteoporosis without current pathological fracture: Secondary | ICD-10-CM

## 2017-06-16 DIAGNOSIS — Z8042 Family history of malignant neoplasm of prostate: Secondary | ICD-10-CM

## 2017-06-16 DIAGNOSIS — Z7984 Long term (current) use of oral hypoglycemic drugs: Secondary | ICD-10-CM | POA: Diagnosis not present

## 2017-06-16 DIAGNOSIS — M1990 Unspecified osteoarthritis, unspecified site: Secondary | ICD-10-CM

## 2017-06-16 DIAGNOSIS — R05 Cough: Secondary | ICD-10-CM

## 2017-06-16 DIAGNOSIS — Z8041 Family history of malignant neoplasm of ovary: Secondary | ICD-10-CM | POA: Diagnosis not present

## 2017-06-16 DIAGNOSIS — E119 Type 2 diabetes mellitus without complications: Secondary | ICD-10-CM | POA: Diagnosis not present

## 2017-06-16 DIAGNOSIS — Z803 Family history of malignant neoplasm of breast: Secondary | ICD-10-CM | POA: Diagnosis not present

## 2017-06-16 DIAGNOSIS — M542 Cervicalgia: Secondary | ICD-10-CM | POA: Diagnosis not present

## 2017-06-16 DIAGNOSIS — F419 Anxiety disorder, unspecified: Secondary | ICD-10-CM

## 2017-06-16 DIAGNOSIS — K219 Gastro-esophageal reflux disease without esophagitis: Secondary | ICD-10-CM | POA: Diagnosis not present

## 2017-06-16 DIAGNOSIS — E785 Hyperlipidemia, unspecified: Secondary | ICD-10-CM

## 2017-06-16 DIAGNOSIS — Z79899 Other long term (current) drug therapy: Secondary | ICD-10-CM

## 2017-06-16 DIAGNOSIS — F329 Major depressive disorder, single episode, unspecified: Secondary | ICD-10-CM

## 2017-06-16 NOTE — Progress Notes (Signed)
Patient here today for initial evaluation regarding breast cancer.  

## 2017-06-16 NOTE — Progress Notes (Signed)
  Oncology Nurse Navigator Documentation  Navigator Location: [facility] (06/16/17 1600) Referral date to RadOnc/MedOnc: 06/16/17 (06/16/17 1600) )Navigator Encounter Type: Initial MedOnc (06/16/17 1600)                         Barriers/Navigation Needs: Education (06/16/17 1600) Education: Newly Diagnosed Cancer Education (06/16/17 1600) Interventions: Education (06/16/17 1600)     Education Method: Verbal;Written (06/16/17 1600)                Time Spent with Patient: 105 (06/16/17 1600)   Met patient during her initial medical oncology consult with Dr. Grayland Ormond.  States she got an injection in her knee on Friday and is ready to move forward with taking care of her breast cancer.  Dr. Grayland Ormond discussed lumpectomy and touch briefly on mastectomy, but the patient was insistent she wanted a lumpectomy.  He also discussed the need for radiation therapy and antihormonal therapy.  Dr. Grayland Ormond is to let Dr. Dwyane Luo office know to move forward with surgical plans.  Gave patient breast cancer educational literature, "My Breast Cancer Treatment Handbook" by Josephine Igo, RN.  She is to call with any questions or needs.

## 2017-06-26 ENCOUNTER — Encounter: Payer: Self-pay | Admitting: *Deleted

## 2017-06-26 NOTE — Progress Notes (Signed)
  Oncology Nurse Navigator Documentation  Navigator Location: [facility] (06/26/17 1100)   )Navigator Encounter Type: Telephone (06/26/17 1100) Telephone: Incoming Call (06/26/17 1100)                       Barriers/Navigation Needs: Coordination of Care (06/26/17 1100)   Interventions: Coordination of Care (06/26/17 1100)   Coordination of Care: Appts (06/26/17 1100)                  Time Spent with Patient: 15 (06/26/17 1100)   Patient called today and states she has not heard from anyone with an appointment for her surgery.  She and Dr. Grayland Ormond had discussed going forward with surgery per her request, instead of waiting on her knee that is getting better.  Appointment scheduled to follow-up with Dr. Bary Castilla for surgical discussion on 07/14/17 @ 10:00.

## 2017-07-03 HISTORY — PX: BREAST LUMPECTOMY: SHX2

## 2017-07-09 ENCOUNTER — Ambulatory Visit: Payer: Medicare Other | Admitting: General Surgery

## 2017-07-14 ENCOUNTER — Inpatient Hospital Stay: Payer: Self-pay

## 2017-07-14 ENCOUNTER — Encounter: Payer: Self-pay | Admitting: General Surgery

## 2017-07-14 ENCOUNTER — Telehealth: Payer: Self-pay

## 2017-07-14 ENCOUNTER — Ambulatory Visit (INDEPENDENT_AMBULATORY_CARE_PROVIDER_SITE_OTHER): Payer: Medicare Other | Admitting: General Surgery

## 2017-07-14 VITALS — BP 136/70 | HR 98 | Resp 16 | Ht 60.0 in | Wt 161.0 lb

## 2017-07-14 DIAGNOSIS — Z170 Estrogen receptor positive status [ER+]: Secondary | ICD-10-CM

## 2017-07-14 DIAGNOSIS — C50412 Malignant neoplasm of upper-outer quadrant of left female breast: Secondary | ICD-10-CM

## 2017-07-14 MED ORDER — LIDOCAINE-PRILOCAINE 2.5-2.5 % EX CREA: 1.0000 "application " | TOPICAL_CREAM | CUTANEOUS | 0 refills | Status: DC | PRN

## 2017-07-14 NOTE — Progress Notes (Signed)
Patient ID: [place], female   DOB: Oct 06, 1933, 84 y.o.   MRN: [id number]  Chief Complaint  Patient presents with  . Follow-up    HPI [place] is a 84 y.o. female here today to discuss left breast surgery.  Since the patient's last visit she has been seen by orthopedics and received 2 injections with a significant improvement in her knee pain.  At this time, she is comfortable enough to consider surgical intervention.  HPI  Past Medical History:  Diagnosis Date  . Anxiety   . Benign breast lumps   . Blood clotting disorder ([place])   . Bone cancer ([place])   . Cervicalgia   . Chronic kidney disease    kidney stones  . Colon cancer ([place])   . Complication of anesthesia    MOOD ALTERATION / UNSURE IF ANES OR PAIN MED. NO TROUBLE WITH MOH'S  . Cough    NASAL DRIP / SNEEZING / SORE THROAT MOSTLY CONSTANT  . Depression   . Diabetes ([place])   . Diverticulitis   . Dizzy   . GERD (gastroesophageal reflux disease)   . HBP (high blood pressure)   . Hematuria    gross  . Hemorrhoids   . HLD (hyperlipidemia)   . Muscle pain   . Osteoarthritis   . Osteoporosis   . Panic disorder   . PND (post-nasal drip)    CHRONIC WITH SORE THROAT AND SNEEZING  . Reflux   . Skin cancer   . Swelling   . Tremors of nervous system     Past Surgical History:  Procedure Laterality Date  . APPENDECTOMY  1962  . BREAST BIOPSY Right 2006?   benign  . BREAST SURGERY Right 2019  . CARPAL TUNNEL RELEASE Right   . CATARACT EXTRACTION W/PHACO Left 11/11/2016   Procedure: CATARACT EXTRACTION PHACO AND INTRAOCULAR LENS PLACEMENT (IOC);  Surgeon: Birder Robson, MD;  Location: [facility] ORS;  Service: Ophthalmology;  Laterality: Left;  [place] 01:07.9 AP% 19.2 CDE 13.02 Fluid pack lot # [id number]  . CATARACT EXTRACTION W/PHACO Right 12/09/2016   Procedure: CATARACT EXTRACTION PHACO AND INTRAOCULAR LENS PLACEMENT (IOC);  Surgeon: Birder Robson, MD;  Location: [facility] ORS;  Service: Ophthalmology;   Laterality: Right;  [place] 00:49 AP% 21.2 CDE 10.39 Fluid pack lot # [id number]  . CHOLECYSTECTOMY    . COLONOSCOPY WITH PROPOFOL N/A 11/22/2014   Procedure: COLONOSCOPY WITH PROPOFOL;  Surgeon: Manya Silvas, MD;  Location: [facility] ENDOSCOPY;  Service: Endoscopy;  Laterality: N/A;  . ESOPHAGOGASTRODUODENOSCOPY  11/22/2014   Procedure: ESOPHAGOGASTRODUODENOSCOPY (EGD);  Surgeon: Manya Silvas, MD;  Location: [facility] ENDOSCOPY;  Service: Endoscopy;;  . ESOPHAGOGASTRODUODENOSCOPY (EGD) WITH PROPOFOL N/A 03/05/2017   Procedure: ESOPHAGOGASTRODUODENOSCOPY (EGD) WITH PROPOFOL;  Surgeon: Lin Landsman, MD;  Location: [facility] ENDOSCOPY;  Service: Gastroenterology;  Laterality: N/A;  . GALLBLADDER SURGERY    . hemi pelvectomy    . HEMIPELVIC Left   . LEG SURGERY Left    AMPUTATION  . OVARY SURGERY Right   . SAVORY DILATION  11/22/2014   Procedure: SAVORY DILATION;  Surgeon: Manya Silvas, MD;  Location: [facility] ENDOSCOPY;  Service: Endoscopy;;  . SKIN CANCER EXCISION    . TONSILLECTOMY      Family History  Problem Relation Age of Onset  . Breast cancer Paternal Aunt   . Ovarian cancer Sister   . Prostate cancer Father   . Stroke Mother   . Kidney cancer Neg Hx   . Bladder Cancer Neg Hx  Social History Social History   Tobacco Use  . Smoking status: Never Smoker  . Smokeless tobacco: Never Used  Substance Use Topics  . Alcohol use: No  . Drug use: No    Allergies  Allergen Reactions  . Amoxicillin     Upset stomach Has patient had a PCN reaction causing immediate rash, facial/tongue/throat swelling, SOB or lightheadedness with hypotension: No Has patient had a PCN reaction causing severe rash involving mucus membranes or skin necrosis: No Has patient had a PCN reaction that required hospitalization: No Has patient had a PCN reaction occurring within the last 10 years: Yes If all of the above answers are "NO", then may proceed with Cephalosporin use.;  . Cefuroxime     OK  INTRACAMERALLY PER DR WLP, upset stomach  . Ciprocinonide [Fluocinolone]     unknown  . Codeine Other (See Comments)    HALLUCINATIONS  . Lipitor [Atorvastatin]   . Latex Rash    Rast test NEGATIVE    Current Outpatient Medications  Medication Sig Dispense Refill  . fenofibrate 160 MG tablet Take 160 mg by mouth daily.     Marland Kitchen GLIPIZIDE XL 2.5 MG 24 hr tablet Take 2.5 mg by mouth daily.     . Iron-Vitamin C (IRON 100/C PO) Take by mouth.    . losartan (COZAAR) 50 MG tablet Take 50 mg by mouth daily.     . metFORMIN (GLUCOPHAGE) 500 MG tablet Take 1,000 mg by mouth 2 (two) times daily.     . metoprolol succinate (TOPROL-XL) 25 MG 24 hr tablet Take 12.5 mg by mouth at bedtime.     Marland Kitchen omeprazole (PRILOSEC) 20 MG capsule Take 2 capsules (40 mg total) by mouth daily before breakfast. (Patient taking differently: Take 20 mg by mouth daily before breakfast. ) 60 capsule 1  . PARoxetine (PAXIL) 40 MG tablet Take 40 mg by mouth daily.     Vladimir Faster Glycol-Propyl Glycol (SYSTANE OP) Place 1 drop into the right eye 2 (two) times daily as needed (dry eyes).    . raloxifene (EVISTA) 60 MG tablet Take 60 mg by mouth daily.     Alveda Reasons 20 MG TABS tablet Take 20 mg by mouth daily.     Marland Kitchen lidocaine-prilocaine (EMLA) cream Apply 1 application topically as needed. Apply morning of surgery around areola 5 g 0   Current Facility-Administered Medications  Medication Dose Route Frequency Provider Last Rate Last Dose  . ciprofloxacin (CIPRO) tablet 500 mg  500 mg Oral Once Nickie Retort, MD      . lidocaine (XYLOCAINE) 2 % jelly 1 application  1 application Urethral Once Nickie Retort, MD        Review of Systems Review of Systems  Constitutional: Negative.   Respiratory: Negative.   Cardiovascular: Negative.     Blood pressure 136/70, pulse 98, resp. rate 16, height 5' (1.524 m), weight 161 lb (73 kg), SpO2 93 %.  Physical Exam Physical Exam  Constitutional: She is oriented to person,  place, and time. She appears well-developed and well-nourished.  HENT:  Mouth/Throat: Oropharynx is clear and moist.  Eyes: Conjunctivae are normal. No scleral icterus.  Neck: Neck supple.  Cardiovascular: Normal rate, regular rhythm and normal heart sounds.  Pulmonary/Chest: Effort normal and breath sounds normal. Left breast exhibits no inverted nipple, no mass, no nipple discharge, no skin change and no tenderness.    Left breast 5 o'clock a 8 mm sebaceous cyst below inframammary  fold  Lymphadenopathy:    She has no cervical adenopathy.  Neurological: She is alert and oriented to person, place, and time.  Skin: Skin is warm and dry.  Psychiatric: Her behavior is normal.    Data Reviewed Ultrasound examination of the left breast in the 2 o'clock position, 4 cm from the nipple shows a 0.43 x 0.58 x 0.66 cm biopsy cavity with clip in place.  This is approximately 1.6 cm below the skin.  Assessment    Candidate for breast conservation.  Desire for skin cyst excision at the same time.  Plans for sentinel node biopsy discussed.    Plan Apply Emla cream morning of surgery   HPI, Physical Exam, Assessment and Plan have been scribed under the direction and in the presence of Robert Bellow, MD. Karie Fetch, RN  I have completed the exam and reviewed the above documentation for accuracy and completeness.  I agree with the above.  [employer] has been used and any errors in dictation or transcription are unintentional.  Hervey Ard, M.D., F.A.C.S. .The patient is scheduled for surgery at [facility] on 07/27/17. She will pre admit at the hospital on 07/21/17. We will call her with her arrival time and location for surgery. The patient is aware of dates and instructions.  Documented by Caryl-Lyn Otis Brace LPN  Forest Gleason Tarry Blayney 07/14/2017, 3:38 PM

## 2017-07-14 NOTE — Telephone Encounter (Signed)
Call to notify patient of pre admit. The patient will pre admit at the hospital on 07/21/17 at 10:00 am. She will have surgery on 07/27/17 at [facility] That day she will report to the radiology desk in the [place] at 8:30 am. The patient is aware of dates, times, and instructions.

## 2017-07-14 NOTE — Patient Instructions (Addendum)
The patient is aware to call back for any questions or concerns. Apply Emla cream morning of surgery  The patient is scheduled for surgery at [facility] on 07/27/17. She will pre admit at the hospital on 07/21/17. We will call her with her arrival time and location for surgery. The patient is aware of dates and instructions.

## 2017-07-20 ENCOUNTER — Other Ambulatory Visit: Payer: Medicare Other

## 2017-07-21 ENCOUNTER — Encounter
Admission: RE | Admit: 2017-07-21 | Discharge: 2017-07-21 | Disposition: A | Discharge status: Home / Self Care | Payer: Medicare Other | Source: Ambulatory Visit | Attending: General Surgery | Admitting: General Surgery

## 2017-07-21 ENCOUNTER — Other Ambulatory Visit: Payer: Self-pay

## 2017-07-21 DIAGNOSIS — R9431 Abnormal electrocardiogram [ECG] [EKG]: Secondary | ICD-10-CM | POA: Diagnosis not present

## 2017-07-21 DIAGNOSIS — I10 Essential (primary) hypertension: Secondary | ICD-10-CM | POA: Diagnosis not present

## 2017-07-21 DIAGNOSIS — Z01810 Encounter for preprocedural cardiovascular examination: Secondary | ICD-10-CM | POA: Diagnosis not present

## 2017-07-21 NOTE — Patient Instructions (Addendum)
Your procedure is scheduled on: Mon.07/27/17 Report to Radiology. At 8:30 [place] Entrance of the hospital.   If you do not followed these instructions completely it may result in serious medical risk, up to and including death, or upon the discretion of your surgeon and anesthesiologist your surgery may need to be rescheduled.     _X__ 1. Do not eat food after midnight the night before your procedure.                 No gum chewing or hard candies. You may drink water up to 2 hours                 before you are scheduled to arrive for your surgery- DO not drink clear                 liquids within 2 hours of the start of your surgery.                  __X__2.  On the morning of surgery brush your teeth with toothpaste and water, you may rinse your mouth with mouthwash if you wish.  Do not swallow any  toothpaste of mouthwash.     _X__ 3.  No Alcohol for 24 hours before or after surgery.   ___ 4.  Do Not Smoke or use e-cigarettes For 24 Hours Prior to Your Surgery.                 Do not use any chewable tobacco products for at least 6 hours prior to                 surgery.  ____  5.  Bring all medications with you on the day of surgery if instructed.   _x___  6.  Notify your doctor if there is any change in your medical condition      (cold, fever, infections).     Do not wear jewelry, make-up, hairpins, clips or nail polish. Do not wear lotions, powders, or perfumes. You may wear deodorant. Do not shave 48 hours prior to surgery. Men may shave face and neck. Do not bring valuables to the hospital.    [facility] is not responsible for any belongings or valuables.  Contacts, dentures or bridgework may not be worn into surgery. Leave your suitcase in the car. After surgery it may be brought to your room. For patients admitted to the hospital, discharge time is determined by your treatment team.   Patients discharged the day of surgery will not be  allowed to drive home.   Please read over the following fact sheets that you were given:    __x__ Take these medicines the morning of surgery with A SIP OF WATER:    1. lidocaine-prilocaine (EMLA) cream apply to brown area around the nipple of your left breast on morning of surgery  2. losartan (COZAAR) 50 MG tablet  3. omeprazole (PRILOSEC) 20 MG capsule ( extra dose)  4.PARoxetine (PAXIL) 40 MG tablet  5.  6.  ____ Fleet Enema (as directed)   __x__ Use CHG Soap as directed  ____ Use inhalers on the day of surgery  ____ Stop metformin 2 days prior to surgery    ____ Take 1/2 of usual insulin dose the night before surgery. No insulin the morning          of surgery.   _x___ Stop  xarelto per MD instructions.  Dr. Kary Kos  ____ Stop  Anti-inflammatories on    ____ Stop supplements until after surgery.    ____ Bring C-Pap to the hospital.

## 2017-07-22 ENCOUNTER — Telehealth: Payer: Self-pay | Admitting: *Deleted

## 2017-07-22 ENCOUNTER — Telehealth (INDEPENDENT_AMBULATORY_CARE_PROVIDER_SITE_OTHER): Payer: Self-pay

## 2017-07-22 NOTE — Telephone Encounter (Signed)
Patient called and reports that she is having breast surgery (mastectomy) Monday and is on Xarelto. Surgeon told her to call Dr Grayland Ormond to see when she should stop and restart Xarelto. Please advise

## 2017-07-22 NOTE — Telephone Encounter (Signed)
Why is she taking Xarelto?  If ok with MD who prescribed it, hold for 48hrs prior to surgery.  Restart the following day.

## 2017-07-22 NOTE — Telephone Encounter (Signed)
Chronic DVT times 2 or 3 and her PCP Dr Kary Kos has been ordering Xarelto. Left message for patient on her voice mail regarding suggested stop start time and to check with Dr Kary Kos regarding this

## 2017-07-22 NOTE — Telephone Encounter (Signed)
Patient called concerning a scheduled surgery that she is having ion this coming Monday the 25.  She is inquiring about when to stop the Xarelto? And when to restart it?  I called the patient back to let her know to stop it 3 days before the procedure and to start back the day after her surgery, as this is standard.

## 2017-07-22 NOTE — Telephone Encounter (Signed)
She called asking if she needed to have someone with her at the hospital the day of surgery, no not necessary, but that she needs to tell the preop nurses so they can go over her discharge instruction prior to the procedure. She states that she can go to [facility] night at [facility] for care if needed, I recommended that she do that since it was available to her for at least one night. She states that [facility] would be bringing her and taking her back, that is OK.

## 2017-07-22 NOTE — Telephone Encounter (Signed)
[place], [place] stay the same.  Thank you.

## 2017-07-27 ENCOUNTER — Ambulatory Visit: Payer: Medicare Other

## 2017-07-27 ENCOUNTER — Ambulatory Visit: Payer: Medicare Other | Admitting: Anesthesiology

## 2017-07-27 ENCOUNTER — Ambulatory Visit
Admission: RE | Admit: 2017-07-27 | Discharge: 2017-07-27 | Disposition: A | Discharge status: Home / Self Care | Payer: Medicare Other | Source: Ambulatory Visit | Attending: General Surgery | Admitting: General Surgery

## 2017-07-27 ENCOUNTER — Encounter: Payer: Self-pay | Admitting: *Deleted

## 2017-07-27 ENCOUNTER — Encounter
Admission: RE | Disposition: A | Discharge status: Home / Self Care | Payer: Self-pay | Source: Ambulatory Visit | Attending: General Surgery

## 2017-07-27 DIAGNOSIS — I10 Essential (primary) hypertension: Secondary | ICD-10-CM | POA: Insufficient documentation

## 2017-07-27 DIAGNOSIS — C50412 Malignant neoplasm of upper-outer quadrant of left female breast: Secondary | ICD-10-CM | POA: Diagnosis present

## 2017-07-27 DIAGNOSIS — K219 Gastro-esophageal reflux disease without esophagitis: Secondary | ICD-10-CM | POA: Insufficient documentation

## 2017-07-27 DIAGNOSIS — F410 Panic disorder [episodic paroxysmal anxiety] without agoraphobia: Secondary | ICD-10-CM | POA: Insufficient documentation

## 2017-07-27 DIAGNOSIS — N6082 Other benign mammary dysplasias of left breast: Secondary | ICD-10-CM | POA: Insufficient documentation

## 2017-07-27 DIAGNOSIS — F329 Major depressive disorder, single episode, unspecified: Secondary | ICD-10-CM | POA: Diagnosis not present

## 2017-07-27 DIAGNOSIS — D689 Coagulation defect, unspecified: Secondary | ICD-10-CM | POA: Diagnosis not present

## 2017-07-27 DIAGNOSIS — E669 Obesity, unspecified: Secondary | ICD-10-CM | POA: Diagnosis present

## 2017-07-27 DIAGNOSIS — Z7901 Long term (current) use of anticoagulants: Secondary | ICD-10-CM | POA: Diagnosis not present

## 2017-07-27 DIAGNOSIS — M810 Age-related osteoporosis without current pathological fracture: Secondary | ICD-10-CM | POA: Diagnosis not present

## 2017-07-27 DIAGNOSIS — Z803 Family history of malignant neoplasm of breast: Secondary | ICD-10-CM | POA: Insufficient documentation

## 2017-07-27 DIAGNOSIS — L728 Other follicular cysts of the skin and subcutaneous tissue: Secondary | ICD-10-CM | POA: Diagnosis not present

## 2017-07-27 DIAGNOSIS — E1151 Type 2 diabetes mellitus with diabetic peripheral angiopathy without gangrene: Secondary | ICD-10-CM | POA: Insufficient documentation

## 2017-07-27 DIAGNOSIS — Z89612 Acquired absence of left leg above knee: Secondary | ICD-10-CM | POA: Diagnosis not present

## 2017-07-27 DIAGNOSIS — I82409 Acute embolism and thrombosis of unspecified deep veins of unspecified lower extremity: Secondary | ICD-10-CM | POA: Diagnosis present

## 2017-07-27 DIAGNOSIS — Z170 Estrogen receptor positive status [ER+]: Secondary | ICD-10-CM

## 2017-07-27 DIAGNOSIS — Z7984 Long term (current) use of oral hypoglycemic drugs: Secondary | ICD-10-CM | POA: Diagnosis not present

## 2017-07-27 DIAGNOSIS — Z85038 Personal history of other malignant neoplasm of large intestine: Secondary | ICD-10-CM | POA: Insufficient documentation

## 2017-07-27 DIAGNOSIS — Z85828 Personal history of other malignant neoplasm of skin: Secondary | ICD-10-CM | POA: Diagnosis not present

## 2017-07-27 DIAGNOSIS — C50919 Malignant neoplasm of unspecified site of unspecified female breast: Secondary | ICD-10-CM

## 2017-07-27 DIAGNOSIS — Z79899 Other long term (current) drug therapy: Secondary | ICD-10-CM | POA: Diagnosis not present

## 2017-07-27 DIAGNOSIS — Z85830 Personal history of malignant neoplasm of bone: Secondary | ICD-10-CM | POA: Insufficient documentation

## 2017-07-27 HISTORY — PX: SENTINEL NODE BIOPSY: SHX6608

## 2017-07-27 HISTORY — PX: MASTECTOMY, PARTIAL: SHX709

## 2017-07-27 HISTORY — PX: BREAST CYST EXCISION: SHX579

## 2017-07-27 HISTORY — DX: Malignant neoplasm of upper-outer quadrant of left female breast: C50.412

## 2017-07-27 HISTORY — DX: Acute embolism and thrombosis of unspecified deep veins of unspecified lower extremity: I82.409

## 2017-07-27 LAB — GLUCOSE, CAPILLARY
GLUCOSE-CAPILLARY: 144 mg/dL — AB (ref 65–99)
Glucose-Capillary: 158 mg/dL — ABNORMAL HIGH (ref 65–99)

## 2017-07-27 SURGERY — MASTECTOMY PARTIAL
Anesthesia: Choice | Laterality: Left | Wound class: Clean

## 2017-07-27 MED ORDER — DEXAMETHASONE SODIUM PHOSPHATE 10 MG/ML IJ SOLN
INTRAMUSCULAR | Status: AC
  Filled 2017-07-27: qty 1

## 2017-07-27 MED ORDER — ACETAMINOPHEN 10 MG/ML IV SOLN
INTRAVENOUS | Status: AC
  Filled 2017-07-27: qty 100

## 2017-07-27 MED ORDER — METHYLENE BLUE 1 % INJ SOLN
INTRAMUSCULAR | Status: AC
  Filled 2017-07-27: qty 10

## 2017-07-27 MED ORDER — METHYLENE BLUE 0.5 % INJ SOLN
INTRAVENOUS | Status: DC | PRN
  Administered 2017-07-27: 5 mL via SUBMUCOSAL

## 2017-07-27 MED ORDER — IPRATROPIUM-ALBUTEROL 0.5-2.5 (3) MG/3ML IN SOLN
RESPIRATORY_TRACT | Status: AC
  Administered 2017-07-27: 3 mL via RESPIRATORY_TRACT
  Filled 2017-07-27: qty 3

## 2017-07-27 MED ORDER — EPHEDRINE SULFATE 50 MG/ML IJ SOLN
INTRAMUSCULAR | Status: AC
  Filled 2017-07-27: qty 1

## 2017-07-27 MED ORDER — BUPIVACAINE-EPINEPHRINE 0.5% -1:200000 IJ SOLN
INTRAMUSCULAR | Status: DC | PRN
Start: 2017-07-27 — End: 2017-07-27
  Administered 2017-07-27: 30 mL

## 2017-07-27 MED ORDER — RIVAROXABAN 20 MG PO TABS: 20.0000 mg | ORAL_TABLET | Freq: Every day | ORAL | 0 refills | Status: DC

## 2017-07-27 MED ORDER — TRAMADOL HCL 50 MG PO TABS
ORAL_TABLET | ORAL | Status: AC
  Administered 2017-07-27: 50 mg
  Filled 2017-07-27: qty 1

## 2017-07-27 MED ORDER — PROPOFOL 10 MG/ML IV BOLUS
INTRAVENOUS | Status: DC | PRN
  Administered 2017-07-27: 30 mg via INTRAVENOUS
  Administered 2017-07-27: 20 mg via INTRAVENOUS
  Administered 2017-07-27: 150 mg via INTRAVENOUS

## 2017-07-27 MED ORDER — FENTANYL CITRATE (PF) 100 MCG/2ML IJ SOLN
INTRAMUSCULAR | Status: DC | PRN
  Administered 2017-07-27 (×4): 25 ug via INTRAVENOUS

## 2017-07-27 MED ORDER — PROPOFOL 10 MG/ML IV BOLUS
INTRAVENOUS | Status: AC
  Filled 2017-07-27: qty 20

## 2017-07-27 MED ORDER — ONDANSETRON HCL 4 MG/2ML IJ SOLN: 4.0000 mg | Freq: Once | INTRAMUSCULAR | Status: DC | PRN

## 2017-07-27 MED ORDER — EPHEDRINE SULFATE 50 MG/ML IJ SOLN
INTRAMUSCULAR | Status: DC | PRN
  Administered 2017-07-27: 10 mg via INTRAVENOUS
  Administered 2017-07-27 (×3): 5 mg via INTRAVENOUS

## 2017-07-27 MED ORDER — SEVOFLURANE IN SOLN
RESPIRATORY_TRACT | Status: AC
  Filled 2017-07-27: qty 250

## 2017-07-27 MED ORDER — ONDANSETRON HCL 4 MG/2ML IJ SOLN
INTRAMUSCULAR | Status: AC
  Filled 2017-07-27: qty 2

## 2017-07-27 MED ORDER — TECHNETIUM TC 99M SULFUR COLLOID FILTERED
0.8200 | Freq: Once | INTRAVENOUS | Status: AC | PRN
  Administered 2017-07-27: 0.82 via INTRADERMAL

## 2017-07-27 MED ORDER — FENTANYL CITRATE (PF) 100 MCG/2ML IJ SOLN: 25.0000 ug | INTRAMUSCULAR | Status: DC | PRN

## 2017-07-27 MED ORDER — FENTANYL CITRATE (PF) 100 MCG/2ML IJ SOLN
INTRAMUSCULAR | Status: AC
  Filled 2017-07-27: qty 2

## 2017-07-27 MED ORDER — ACETAMINOPHEN 10 MG/ML IV SOLN
INTRAVENOUS | Status: DC | PRN
  Administered 2017-07-27: 1000 mg via INTRAVENOUS

## 2017-07-27 MED ORDER — IPRATROPIUM-ALBUTEROL 0.5-2.5 (3) MG/3ML IN SOLN
3.0000 mL | Freq: Once | RESPIRATORY_TRACT | Status: AC
  Administered 2017-07-27: 3 mL via RESPIRATORY_TRACT

## 2017-07-27 MED ORDER — PHENYLEPHRINE HCL 10 MG/ML IJ SOLN
INTRAMUSCULAR | Status: DC | PRN
  Administered 2017-07-27 (×2): 100 ug via INTRAVENOUS
  Administered 2017-07-27: 50 ug via INTRAVENOUS
  Administered 2017-07-27: 100 ug via INTRAVENOUS

## 2017-07-27 MED ORDER — SODIUM CHLORIDE 0.9 % IV SOLN
INTRAVENOUS | Status: DC
  Administered 2017-07-27: 12:00:00 via INTRAVENOUS

## 2017-07-27 MED ORDER — BUPIVACAINE-EPINEPHRINE (PF) 0.5% -1:200000 IJ SOLN
INTRAMUSCULAR | Status: AC
  Filled 2017-07-27: qty 30

## 2017-07-27 SURGICAL SUPPLY — 60 items
BANDAGE ELASTIC 6 LF NS (GAUZE/BANDAGES/DRESSINGS) ×2 IMPLANT
BINDER BREAST LRG (GAUZE/BANDAGES/DRESSINGS) IMPLANT
BINDER BREAST MEDIUM (GAUZE/BANDAGES/DRESSINGS) IMPLANT
BINDER BREAST XLRG (GAUZE/BANDAGES/DRESSINGS) IMPLANT
BINDER BREAST XXLRG (GAUZE/BANDAGES/DRESSINGS) IMPLANT
BLADE SURG 15 STRL SS SAFETY (BLADE) ×4 IMPLANT
BNDG GAUZE 4.5X4.1 6PLY STRL (MISCELLANEOUS) ×2 IMPLANT
BULB RESERV EVAC DRAIN JP 100C (MISCELLANEOUS) IMPLANT
CANISTER SUCT 1200ML W/VALVE (MISCELLANEOUS) ×2 IMPLANT
CHLORAPREP W/TINT 26ML (MISCELLANEOUS) ×2 IMPLANT
CNTNR SPEC 2.5X3XGRAD LEK (MISCELLANEOUS) ×1
CONT SPEC 4OZ STER OR WHT (MISCELLANEOUS) ×1
CONTAINER SPEC 2.5X3XGRAD LEK (MISCELLANEOUS) ×1 IMPLANT
COVER LIGHT HANDLE STERIS (MISCELLANEOUS) ×4 IMPLANT
COVER PROBE FLX POLY STRL (MISCELLANEOUS) ×2 IMPLANT
DEVICE DUBIN SPECIMEN MAMMOGRA (MISCELLANEOUS) ×2 IMPLANT
DRAIN CHANNEL JP 15F RND 16 (MISCELLANEOUS) IMPLANT
DRAPE LAPAROTOMY 100X77 ABD (DRAPES) ×2 IMPLANT
DRAPE LAPAROTOMY TRNSV 106X77 (MISCELLANEOUS) ×2 IMPLANT
DRSG TEGADERM 2-3/8X2-3/4 SM (GAUZE/BANDAGES/DRESSINGS) ×2 IMPLANT
DRSG TELFA 3X8 NADH (GAUZE/BANDAGES/DRESSINGS) ×2 IMPLANT
DRSG TELFA 4X3 1S NADH ST (GAUZE/BANDAGES/DRESSINGS) ×4 IMPLANT
ELECT CAUTERY BLADE TIP 2.5 (TIP) ×2
ELECT REM PT RETURN 9FT ADLT (ELECTROSURGICAL) ×2
ELECTRODE CAUTERY BLDE TIP 2.5 (TIP) ×1 IMPLANT
ELECTRODE REM PT RTRN 9FT ADLT (ELECTROSURGICAL) ×1 IMPLANT
GAUZE FLUFF 18X24 1PLY STRL (GAUZE/BANDAGES/DRESSINGS) ×2 IMPLANT
GAUZE SPONGE 4X4 12PLY STRL (GAUZE/BANDAGES/DRESSINGS) ×2 IMPLANT
GLOVE BIO SURGEON STRL SZ7.5 (GLOVE) ×2 IMPLANT
GLOVE INDICATOR 8.0 STRL GRN (GLOVE) ×2 IMPLANT
GOWN STRL REUS W/ TWL LRG LVL3 (GOWN DISPOSABLE) ×2 IMPLANT
GOWN STRL REUS W/TWL LRG LVL3 (GOWN DISPOSABLE) ×2
KIT TURNOVER KIT A (KITS) ×2 IMPLANT
LABEL OR SOLS (LABEL) ×2 IMPLANT
MARGIN MAP 10MM (MISCELLANEOUS) ×2 IMPLANT
NDL SAFETY ECLIPSE 18X1.5 (NEEDLE) ×1 IMPLANT
NEEDLE HYPO 18GX1.5 SHARP (NEEDLE) ×1
NEEDLE HYPO 22GX1.5 SAFETY (NEEDLE) ×2 IMPLANT
NEEDLE HYPO 25X1 1.5 SAFETY (NEEDLE) ×4 IMPLANT
PACK BASIN MINOR ARMC (MISCELLANEOUS) ×2 IMPLANT
RETRACTOR RING XSMALL (MISCELLANEOUS) ×1 IMPLANT
RTRCTR WOUND ALEXIS 13CM XS SH (MISCELLANEOUS) ×2
SHEARS FOC LG CVD HARMONIC 17C (MISCELLANEOUS) IMPLANT
SHEARS HARMONIC 9CM CVD (BLADE) ×2 IMPLANT
SLEVE PROBE SENORX GAMMA FIND (MISCELLANEOUS) ×2 IMPLANT
STRIP CLOSURE SKIN 1/2X4 (GAUZE/BANDAGES/DRESSINGS) ×2 IMPLANT
SUT ETHILON 3-0 FS-10 30 BLK (SUTURE) ×2
SUT SILK 2 0 (SUTURE) ×1
SUT SILK 2-0 18XBRD TIE 12 (SUTURE) ×1 IMPLANT
SUT VIC AB 2-0 CT1 27 (SUTURE) ×1
SUT VIC AB 2-0 CT1 TAPERPNT 27 (SUTURE) ×1 IMPLANT
SUT VIC AB 4-0 FS2 27 (SUTURE) ×4 IMPLANT
SUT VICRYL+ 3-0 144IN (SUTURE) ×2 IMPLANT
SUTURE EHLN 3-0 FS-10 30 BLK (SUTURE) ×1 IMPLANT
SWABSTK COMLB BENZOIN TINCTURE (MISCELLANEOUS) ×2 IMPLANT
SYR 10ML LL (SYRINGE) ×2 IMPLANT
SYR BULB IRRIG 60ML STRL (SYRINGE) ×2 IMPLANT
SYR CONTROL 10ML (SYRINGE) ×2 IMPLANT
TAPE TRANSPORE STRL 2 31045 (GAUZE/BANDAGES/DRESSINGS) ×2 IMPLANT
WATER STERILE IRR 1000ML POUR (IV SOLUTION) ×2 IMPLANT

## 2017-07-27 NOTE — Anesthesia Post-op Follow-up Note (Signed)
Anesthesia QCDR form completed.        

## 2017-07-27 NOTE — Op Note (Signed)
Preoperative diagnosis: Left breast cancer, skin cyst.  Postoperative diagnosis: Same.  Operative procedure: Wide excision left upper outer quadrant breast cancer, sentinel node biopsy.  Excision left lower breast skin cyst.  Operating Surgeon: Hervey Ard, MD.  Anesthesia: General by LMA, Marcaine 0.5% with 1-200,000 units of epinephrine, 30 cc.  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  She desired breast conservation.  She has a symptomatic skin cyst on the same breast that will be excised at the same setting.  Operative note: The patient was injected with technetium sulfur colloid prior to the procedure.  The patient underwent general anesthesia without difficulty.  The area of the nipple was cleansed with alcohol and 5 cc of 0.5% Xylocaine with 0.25% Marcaine with 1-200,000 units of epinephrine was used for local anesthesia and well-tolerated.  The breast chest methylene blue was instilled in the subareolar plexus.  The breast was then cleansed with ChloraPrep and draped.  Ultrasound was used to identify the site of malignancy in the 1-2 o'clock position.  Local anesthesia was infiltrated for postoperative analgesia.  An elliptical incision from the 12 to 2 o'clock position was made carried down through skin subtendinous tissue.  The adipose tissue was elevated off the area and a 3 x 3 x 3 cm block of tissue was excised orientated and sent for specimen radiograph.  There was a significant delay in retrieving the film from the radiology department.  The area at the inferior deep margin appeared somewhat thickened and on review of the specimen radiograph this appear to be adjacent to the previous biopsy site.  An additional 8-10 mm of tissue from the inferior deep margin was excised, orientated and sent in formalin for routine histology.  While the initial breast specimen was  pending attention was turned to the axilla.  To minimize axillary discomfort in this patient who uses her wheelchair for ambulation it was elected to approach the axilla through the primary excision site.  The node seeker device was used and a single hot, blue node in the mid axilla was identified.  This was sent in formalin for routine histology.  Counts were over 1000.  No areas within the axilla showed counts over 80 after this node was excised.  With good hemostasis the deep adipose tissue was approximated with interrupted 2-0 Vicryl figure-of-eight sutures.  The subcutaneous tissue was approximated with a running 2-0 Vicryl suture.  The inferior skin was freed with cautery to provide smooth approximation and the skin closed with a running 4-0 Vicryl subarticular suture.  The small skin cyst in the lower breast was infiltrated with local anesthetic and excised in elliptical incision.  Hemostasis was electrocautery and the skin defect was closed with a running 4-0 Vicryl septicum suture.  Benzoin, Steri-Strips followed by Telfa dressings were applied to both lesions, Tegaderm applied to the skin cyst area.  Fluff gauze, and a compressive surgical bra was applied.  Patient tolerated procedure well and was taken to recovery room in stable condition.

## 2017-07-27 NOTE — Transfer of Care (Signed)
Immediate Anesthesia Transfer of Care Note  Patient: Julie Acosta  Procedure(s) Performed: MASTECTOMY PARTIAL (Left ) SENTINEL NODE BIOPSY (Left ) SKIN CYST EXCISED (Left )  Patient Location: PACU  Anesthesia Type:General  Level of Consciousness: drowsy and patient cooperative  Airway & Oxygen Therapy: Patient Spontanous Breathing and Patient connected to face mask oxygen  Post-op Assessment: Report given to RN and Post -op Vital signs reviewed and stable  Post vital signs: Reviewed and stable  Last Vitals:  Vitals Value Taken Time  BP 125/59 07/27/2017  1:28 PM  Temp    Pulse 92 07/27/2017  1:28 PM  Resp 25 07/27/2017  1:28 PM  SpO2 99 % 07/27/2017  1:28 PM  Vitals shown include unvalidated device data.  Last Pain:  Vitals:   07/27/17 0936  TempSrc: Oral  PainSc: 0-No pain         Complications: No apparent anesthesia complications

## 2017-07-27 NOTE — Progress Notes (Signed)
Dr Andree Elk notified pt not maintaining sats over 92,sleepy, no SOB/NAD/clear to auscultation.  Pt in PACU over 1 hour. Arouses easily and increasingly alert.  MD reports pt may discharged when maintaining sats over 90%.

## 2017-07-27 NOTE — Discharge Instructions (Signed)

## 2017-07-27 NOTE — Anesthesia Procedure Notes (Signed)
Procedure Name: LMA Insertion Date/Time: 07/27/2017 11:56 AM Performed by: Jonna Clark, CRNA Pre-anesthesia Checklist: Patient identified, Patient being monitored, Timeout performed, Emergency Drugs available and Suction available Patient Re-evaluated:Patient Re-evaluated prior to induction Oxygen Delivery Method: Circle system utilized Preoxygenation: Pre-oxygenation with 100% oxygen Induction Type: IV induction Ventilation: Mask ventilation without difficulty LMA: LMA inserted LMA Size: 3.5 Tube type: Oral Number of attempts: 1 Placement Confirmation: positive ETCO2 and breath sounds checked- equal and bilateral Tube secured with: Tape Dental Injury: Teeth and Oropharynx as per pre-operative assessment

## 2017-07-27 NOTE — H&P (Signed)
No change in clinical history or exam. For left breast wide excision, SLN biopsy and skin cyst excision.

## 2017-07-28 ENCOUNTER — Encounter: Payer: Self-pay | Admitting: General Surgery

## 2017-07-28 NOTE — Anesthesia Postprocedure Evaluation (Signed)
Anesthesia Post Note  Patient: Julie Acosta  Procedure(s) Performed: MASTECTOMY PARTIAL (Left ) SENTINEL NODE BIOPSY (Left ) SKIN CYST EXCISED (Left )  Patient location during evaluation: PACU Anesthesia Type: General Level of consciousness: awake and alert Pain management: pain level controlled Vital Signs Assessment: post-procedure vital signs reviewed and stable Respiratory status: spontaneous breathing, nonlabored ventilation, respiratory function stable and patient connected to nasal cannula oxygen Cardiovascular status: blood pressure returned to baseline and stable Postop Assessment: no apparent nausea or vomiting Anesthetic complications: no     Last Vitals:  Vitals:   07/27/17 1543 07/27/17 1558  BP: 123/69 (!) 113/48  Pulse: 98 96  Resp: 18 16  Temp: (!) 36.1 C   SpO2: 96% 94%    Last Pain:  Vitals:   07/27/17 1558  TempSrc:   PainSc: 5                  Molli Barrows

## 2017-07-28 NOTE — Anesthesia Preprocedure Evaluation (Signed)
Anesthesia Evaluation  Patient identified by MRN, date of birth, ID band Patient awake    Reviewed: Allergy & Precautions, H&P , NPO status , Patient's Chart, lab work & pertinent test results, reviewed documented beta blocker date and time   History of Anesthesia Complications (+) history of anesthetic complications  Airway Mallampati: II   Neck ROM: full    Dental  (+) Teeth Intact   Pulmonary neg pulmonary ROS,    Pulmonary exam normal        Cardiovascular hypertension, + Peripheral Vascular Disease  negative cardio ROS Normal cardiovascular exam Rhythm:regular Rate:Normal     Neuro/Psych PSYCHIATRIC DISORDERS Anxiety Depression negative neurological ROS  negative psych ROS   GI/Hepatic negative GI ROS, Neg liver ROS, GERD  ,  Endo/Other  negative endocrine ROSdiabetes  Renal/GU Renal diseasenegative Renal ROS  negative genitourinary   Musculoskeletal   Abdominal   Peds  Hematology negative hematology ROS (+)   Anesthesia Other Findings Past Medical History: No date: Anxiety No date: Benign breast lumps No date: Blood clotting disorder (HCC) No date: Bone cancer ([place]) No date: Cervicalgia No date: Chronic kidney disease     Comment:  kidney stones No date: Colon cancer ([place]) No date: Complication of anesthesia     Comment:  MOOD ALTERATION / UNSURE IF ANES OR PAIN MED. NO TROUBLE              WITH MOH'S No date: Cough     Comment:  NASAL DRIP / SNEEZING / SORE THROAT MOSTLY CONSTANT No date: Depression No date: Diabetes ([place]) No date: Diverticulitis No date: Dizzy No date: GERD (gastroesophageal reflux disease) No date: HBP (high blood pressure) No date: Hematuria     Comment:  gross No date: Hemorrhoids No date: HLD (hyperlipidemia) No date: Muscle pain No date: Osteoarthritis No date: Osteoporosis No date: Panic disorder No date: PND (post-nasal drip)     Comment:  CHRONIC WITH SORE  THROAT AND SNEEZING No date: Reflux No date: Skin cancer No date: Swelling No date: Tremors of nervous system Past Surgical History: 1962: APPENDECTOMY 2006?: BREAST BIOPSY; Right     Comment:  benign 2019: BREAST SURGERY; Right No date: CARPAL TUNNEL RELEASE; Right 11/11/2016: CATARACT EXTRACTION W/PHACO; Left     Comment:  Procedure: CATARACT EXTRACTION PHACO AND INTRAOCULAR               LENS PLACEMENT (IOC);  Surgeon: Birder Robson, MD;                Location: [facility] ORS;  Service: Ophthalmology;  Laterality:              Left;  [place] 01:07.9 AP% 19.2 CDE 13.02 Fluid pack lot #               [id number] 12/09/2016: CATARACT EXTRACTION W/PHACO; Right     Comment:  Procedure: CATARACT EXTRACTION PHACO AND INTRAOCULAR               LENS PLACEMENT (IOC);  Surgeon: Birder Robson, MD;                Location: [facility] ORS;  Service: Ophthalmology;  Laterality:              Right;  [place] 00:49 AP% 21.2 CDE 10.39 Fluid pack lot #               [id number] No date: CHOLECYSTECTOMY 11/22/2014: COLONOSCOPY WITH PROPOFOL; N/A     Comment:  Procedure: COLONOSCOPY  WITH PROPOFOL;  Surgeon: Manya Silvas, MD;  Location[facility] ENDOSCOPY;  Service:               Endoscopy;  Laterality: N/A; 11/22/2014: ESOPHAGOGASTRODUODENOSCOPY     Comment:  Procedure: ESOPHAGOGASTRODUODENOSCOPY (EGD);  Surgeon:               Manya Silvas, MD;  Location: [facility] ENDOSCOPY;                Service: Endoscopy;; 03/05/2017: ESOPHAGOGASTRODUODENOSCOPY (EGD) WITH PROPOFOL; N/A     Comment:  Procedure: ESOPHAGOGASTRODUODENOSCOPY (EGD) WITH               PROPOFOL;  Surgeon: Lin Landsman, MD;  Location:               [facility] ENDOSCOPY;  Service: Gastroenterology;  Laterality:               N/A; No date: GALLBLADDER SURGERY No date: hemi pelvectomy No date: HEMIPELVIC; Left No date: LEG SURGERY; Left     Comment:  AMPUTATION No date: OVARY SURGERY; Right 11/22/2014: SAVORY DILATION     Comment:   Procedure: SAVORY DILATION;  Surgeon: Manya Silvas,               MD;  Location: [facility] ENDOSCOPY;  Service: Endoscopy;; No date: SKIN CANCER EXCISION No date: TONSILLECTOMY BMI    Body Mass Index:  31.44 kg/m     Reproductive/Obstetrics negative OB ROS                             Anesthesia Physical Anesthesia Plan  ASA: III  Anesthesia Plan: General   Post-op Pain Management:    Induction:   PONV Risk Score and Plan:   Airway Management Planned:   Additional Equipment:   Intra-op Plan:   Post-operative Plan:   Informed Consent: I have reviewed the patients History and Physical, chart, labs and discussed the procedure including the risks, benefits and alternatives for the proposed anesthesia with the patient or authorized representative who has indicated his/her understanding and acceptance.   Dental Advisory Given  Plan Discussed with: CRNA  Anesthesia Plan Comments:         Anesthesia Quick Evaluation

## 2017-07-29 ENCOUNTER — Telehealth: Payer: Self-pay | Admitting: *Deleted

## 2017-07-29 LAB — SURGICAL PATHOLOGY

## 2017-07-29 NOTE — Telephone Encounter (Signed)
Notified patient as instructed, patient pleased. Discussed follow-up appointments, patient agrees Discharge instructions and postop care reviewed again with the patient. She states she started her Alen Blew today instead of waiting (she missed that portion of the instructions)

## 2017-07-29 NOTE — Telephone Encounter (Signed)
-----   Message from Robert Bellow, MD sent at 07/29/2017 11:59 AM EDT ----- Please notify the patient all margins and lymph node clear.  ----- Message ----- From: Interface, Lab In Three Zero One Sent: 07/29/2017  10:13 AM To: Robert Bellow, MD

## 2017-07-30 ENCOUNTER — Telehealth: Payer: Self-pay | Admitting: *Deleted

## 2017-07-30 NOTE — Telephone Encounter (Signed)
-----   Message from Robert Bellow, MD sent at 07/30/2017  9:57 AM EDT ----- Please have lab send blocks out for Oncotype DX testing. Thanks.  ----- Message ----- From: Lloyd Huger, MD Sent: 07/30/2017   9:08 AM To: Robert Bellow, MD  I usually sent oncotype.  Thanks.  ----- Message ----- From: Robert Bellow, MD Sent: 07/30/2017   8:55 AM To: Lloyd Huger, MD  Final path: T1c, N0.  Mammoprint or Oncotype?  Originally T1b based on u/s.

## 2017-07-30 NOTE — Telephone Encounter (Signed)
oncotype form and pathology faxed to [facility]

## 2017-08-04 ENCOUNTER — Ambulatory Visit: Payer: Medicare Other | Admitting: General Surgery

## 2017-08-06 ENCOUNTER — Ambulatory Visit (INDEPENDENT_AMBULATORY_CARE_PROVIDER_SITE_OTHER): Payer: Medicare Other | Admitting: General Surgery

## 2017-08-06 ENCOUNTER — Encounter: Payer: Self-pay | Admitting: General Surgery

## 2017-08-06 ENCOUNTER — Ambulatory Visit: Payer: Self-pay

## 2017-08-06 ENCOUNTER — Other Ambulatory Visit: Payer: Self-pay | Admitting: *Deleted

## 2017-08-06 VITALS — BP 132/72 | HR 90 | Resp 16 | Ht 61.0 in | Wt 163.0 lb

## 2017-08-06 DIAGNOSIS — Z170 Estrogen receptor positive status [ER+]: Secondary | ICD-10-CM

## 2017-08-06 DIAGNOSIS — C50412 Malignant neoplasm of upper-outer quadrant of left female breast: Secondary | ICD-10-CM

## 2017-08-06 NOTE — Patient Instructions (Signed)
Patient to be scheduled with Dr. Baruch Gouty .The patient is aware to call back for any questions or concerns.

## 2017-08-06 NOTE — Progress Notes (Signed)
Patient ID: Julie Acosta, female   DOB: 03-Nov-1933, 84 y.o.   MRN: [id number]  Chief Complaint  Patient presents with  . Routine Post Op    HPI Julie Acosta is a 84 y.o. female here today for her post op left breast wide excision done on 07/27/2017.  She states she is doing well, no pain.  HPI  Past Medical History:  Diagnosis Date  . Anxiety   . Benign breast lumps   . Blood clotting disorder ([place])   . Bone cancer ([place])   . Cervicalgia   . Chronic kidney disease    kidney stones  . Colon cancer ([place])   . Complication of anesthesia    MOOD ALTERATION / UNSURE IF ANES OR PAIN MED. NO TROUBLE WITH MOH'S  . Cough    NASAL DRIP / SNEEZING / SORE THROAT MOSTLY CONSTANT  . Depression   . Diabetes ([place])   . Diverticulitis   . Dizzy   . GERD (gastroesophageal reflux disease)   . HBP (high blood pressure)   . Hematuria    gross  . Hemorrhoids   . HLD (hyperlipidemia)   . Muscle pain   . Osteoarthritis   . Osteoporosis   . Panic disorder   . PND (post-nasal drip)    CHRONIC WITH SORE THROAT AND SNEEZING  . Reflux   . Skin cancer   . Swelling   . Tremors of nervous system     Past Surgical History:  Procedure Laterality Date  . APPENDECTOMY  1962  . BREAST BIOPSY Right 2006?   benign  . BREAST CYST EXCISION Left 07/27/2017   Procedure: SKIN CYST EXCISED;  Surgeon: Robert Bellow, MD;  Location: [facility] ORS;  Service: General;  Laterality: Left;  . BREAST SURGERY Right 2019  . CARPAL TUNNEL RELEASE Right   . CATARACT EXTRACTION W/PHACO Left 11/11/2016   Procedure: CATARACT EXTRACTION PHACO AND INTRAOCULAR LENS PLACEMENT (IOC);  Surgeon: Birder Robson, MD;  Location: [facility] ORS;  Service: Ophthalmology;  Laterality: Left;  [place] 01:07.9 AP% 19.2 CDE 13.02 Fluid pack lot # [id number]  . CATARACT EXTRACTION W/PHACO Right 12/09/2016   Procedure: CATARACT EXTRACTION PHACO AND INTRAOCULAR LENS PLACEMENT (IOC);  Surgeon: Birder Robson, MD;  Location: [facility] ORS;  Service:  Ophthalmology;  Laterality: Right;  [place] 00:49 AP% 21.2 CDE 10.39 Fluid pack lot # [id number]  . CHOLECYSTECTOMY    . COLONOSCOPY WITH PROPOFOL N/A 11/22/2014   Procedure: COLONOSCOPY WITH PROPOFOL;  Surgeon: Manya Silvas, MD;  Location: [facility] ENDOSCOPY;  Service: Endoscopy;  Laterality: N/A;  . ESOPHAGOGASTRODUODENOSCOPY  11/22/2014   Procedure: ESOPHAGOGASTRODUODENOSCOPY (EGD);  Surgeon: Manya Silvas, MD;  Location: [facility] ENDOSCOPY;  Service: Endoscopy;;  . ESOPHAGOGASTRODUODENOSCOPY (EGD) WITH PROPOFOL N/A 03/05/2017   Procedure: ESOPHAGOGASTRODUODENOSCOPY (EGD) WITH PROPOFOL;  Surgeon: Lin Landsman, MD;  Location: [facility] ENDOSCOPY;  Service: Gastroenterology;  Laterality: N/A;  . GALLBLADDER SURGERY    . hemi pelvectomy    . HEMIPELVIC Left   . LEG SURGERY Left    AMPUTATION  . MASTECTOMY, PARTIAL Left 07/27/2017   Procedure: MASTECTOMY PARTIAL;  Surgeon: Robert Bellow, MD;  Location: [facility] ORS;  Service: General;  Laterality: Left;  . OVARY SURGERY Right   . SAVORY DILATION  11/22/2014   Procedure: SAVORY DILATION;  Surgeon: Manya Silvas, MD;  Location: [facility] ENDOSCOPY;  Service: Endoscopy;;  . SENTINEL NODE BIOPSY Left 07/27/2017   Procedure: SENTINEL NODE BIOPSY;  Surgeon: Robert Bellow, MD;  Location: [facility] ORS;  Service: General;  Laterality: Left;  . SKIN CANCER EXCISION    . TONSILLECTOMY      Family History  Problem Relation Age of Onset  . Breast cancer Paternal Aunt   . Ovarian cancer Sister   . Prostate cancer Father   . Stroke Mother   . Kidney cancer Neg Hx   . Bladder Cancer Neg Hx     Social History Social History   Tobacco Use  . Smoking status: Never Smoker  . Smokeless tobacco: Never Used  Substance Use Topics  . Alcohol use: No  . Drug use: No    Allergies  Allergen Reactions  . Codeine Other (See Comments)    HALLUCINATIONS  . Tape     Rash and skin irritation/ paper tape and tegaderm OK  . Ciprocinonide [Fluocinolone] Other  (See Comments)    unknown  . Amoxicillin Other (See Comments)    Upset stomach Has patient had a PCN reaction causing immediate rash, facial/tongue/throat swelling, SOB or lightheadedness with hypotension: No Has patient had a PCN reaction causing severe rash involving mucus membranes or skin necrosis: No Has patient had a PCN reaction that required hospitalization: No Has patient had a PCN reaction occurring within the last 10 years: Yes If all of the above answers are "NO", then may proceed with Cephalosporin use.;  . Cefuroxime Other (See Comments)    OK INTRACAMERALLY PER DR WLP, upset stomach  . Lipitor [Atorvastatin] Other (See Comments)    unknown    Current Outpatient Medications  Medication Sig Dispense Refill  . acetaminophen (TYLENOL) 500 MG tablet Take 500 mg by mouth every 6 (six) hours as needed (for pain/headaches.).    Marland Kitchen fenofibrate 160 MG tablet Take 160 mg by mouth daily.     Marland Kitchen GLIPIZIDE XL 2.5 MG 24 hr tablet Take 2.5 mg by mouth daily.     Marland Kitchen losartan (COZAAR) 50 MG tablet Take 50 mg by mouth daily.     . metFORMIN (GLUCOPHAGE) 500 MG tablet Take 1,000 mg by mouth 2 (two) times daily.     . metoprolol succinate (TOPROL-XL) 25 MG 24 hr tablet Take 12.5 mg by mouth at bedtime.     Marland Kitchen PARoxetine (PAXIL) 40 MG tablet Take 40 mg by mouth daily.     Vladimir Faster Glycol-Propyl Glycol (SYSTANE OP) Place 1 drop into the right eye 2 (two) times daily as needed (dry eyes).    . raloxifene (EVISTA) 60 MG tablet Take 60 mg by mouth daily.     . rivaroxaban (XARELTO) 20 MG TABS tablet Take 1 tablet (20 mg total) by mouth daily. 30 tablet 0  . omeprazole (PRILOSEC) 20 MG capsule Take 2 capsules (40 mg total) by mouth daily before breakfast. (Patient taking differently: Take 20 mg by mouth every evening. ) 60 capsule 1   No current facility-administered medications for this visit.     Review of Systems Review of Systems  Constitutional: Negative.   Respiratory: Negative.    Cardiovascular: Negative.     Blood pressure 132/72, pulse 90, resp. rate 16, height '5\' 1"'$  (1.549 m), weight 163 lb (73.9 kg).  Physical Exam Physical Exam  Constitutional: She is oriented to person, place, and time. She appears well-developed and well-nourished.  Neurological: She is alert and oriented to person, place, and time.  Skin: Skin is warm and dry.    Data Reviewed DIAGNOSIS:  A. BREAST, LEFT UPPER OUTER QUADRANT; EXCISION:  - INVASIVE MAMMARY CARCINOMA.  - DUCTAL CARCINOMA  IN SITU.  - SEE CANCER SUMMARY BELOW.  - PRIOR BIOPSY SITE CHANGE WITH METALLIC CLIP.  - CALCIFIED NODULE OF FAT NECROSIS MEASURING 0.3 CM.   B. SENTINEL LYMPH NODE 1, LEFT; EXCISION:  - ONE LYMPH NODE NEGATIVE FOR MALIGNANCY (0/1).   C. SKIN CYST, LEFT BREAST; EXCISION:  - EPIDERMAL INCLUSION CYST, BENIGN.   D. NEW INFERIOR DEEP MARGIN, LEFT BREAST; EXCISION:  - INVASIVE MAMMARY CARCINOMA.  - DUCTAL CARCINOMA IN SITU.  - SEE CANCER SUMMARY BELOW.  - DUCT ECTASIA.   CANCER CASE SUMMARY: INVASIVE CARCINOMA OF THE BREAST  Procedure: Wide excision  Specimen Laterality: Left  Tumor Size: 11 mm  Histologic Type: Invasive mammary carcinoma no special type  Histologic Grade (Nottingham Histologic Score)    Glandular ([place]cin[place])/Tubular Differentiation: 3    Nuclear Pleomorphism: 2    Mitotic Rate: 1    Overall Grade: 2  Ductal Carcinoma In Situ (DCIS): Present, nuclear grade 2, solid type,  without necrosis  Margins:    Invasive Carcinoma Margins: Uninvolved by invasive carcinoma       Distance from closest margin: 0.45 mm       Specify closest margin: Inferior    DCIS Margins: Uninvolved by DCIS       DistanDce from closest margin: 0.45 mm       Specify closest margin: Inferior  Regional Lymph Nodes: Uninvolved by tumor cells  Number of Lymph Nodes Examined: 1  Number of Sentinel Nodes Examined: 1  Lymphovascular Invasion: Not identified  Pathologic  Stage Classification (pTNM, AJCC 8th Edition): pT1c pN0 (sn)   BREAST BIOMARKER TESTS - performed on prior biopsy  Estrogen Receptor (ER) Status: Positive, greater than 90%  Progesterone Receptor (PgR) Status: Positive, range of 51-90%  HER2 (by immunohistochemistry): Equivocal (2+)  HER2 (ERBB2) (by in situ hybridization): Negative   Comment:  Invasive carcinoma involves the deep / inferior margin of the main  excision specimen (specimen A). A new, separately submitted deep /  inferior margin (specimen D) demonstrates invasive carcinoma along the  superior aspect of the specimen (corresponding to the inferior aspect of  the main excision specimen). All final margins are negative for invasive  and in situ carcinoma.   Margins/ nodes negative. DCIS < 2 mm, would not re-excise in light of T1c, N0 invasive component.  Ultrasound examination of the upper outer quadrant of the left breast showed a well-defined seroma cavity measuring approximately 2.0 x 4.1 x 4.5 cm.  This is approximately 1.8 cm below the overlying skin.  Adequate spacing for accelerated partial breast radiation.  Oncotype DX test ordered on discussion with Dr. Grayland Ormond.   Assessment    Doing well post wide excision.     Plan  Patient to be scheduled with Dr. Baruch Gouty .The patient is aware to call back for any questions or concerns.   HPI, Physical Exam, Assessment and Plan have been scribed under the direction and in the presence of Hervey Ard, MD.  Julie Acosta, CMA  I have completed the exam and reviewed the above documentation for accuracy and completeness.  I agree with the above.  Haematologist has been used and any errors in dictation or transcription are unintentional.  Hervey Ard, M.D., F.A.C.S.  Forest Gleason Bethany Cumming 08/06/2017, 8:18 PM  Patient has been scheduled for an appointment with Dr. Noreene Filbert at the [facility] for mammosite evaluation for 08-14-17 at 9:30 am (date due  to Dr. Baruch Gouty being on vacation). The patient is aware of date,  time, and instructions.   Dominga Ferry, CMA

## 2017-08-10 ENCOUNTER — Encounter: Payer: Self-pay | Admitting: Podiatry

## 2017-08-10 ENCOUNTER — Ambulatory Visit (INDEPENDENT_AMBULATORY_CARE_PROVIDER_SITE_OTHER): Payer: Medicare Other | Admitting: Podiatry

## 2017-08-10 DIAGNOSIS — M79676 Pain in unspecified toe(s): Secondary | ICD-10-CM

## 2017-08-10 DIAGNOSIS — E119 Type 2 diabetes mellitus without complications: Secondary | ICD-10-CM

## 2017-08-10 DIAGNOSIS — B351 Tinea unguium: Secondary | ICD-10-CM

## 2017-08-10 DIAGNOSIS — D689 Coagulation defect, unspecified: Secondary | ICD-10-CM

## 2017-08-10 NOTE — Progress Notes (Signed)
This patient presents the office for continued preventative foot care services on her right foot. She hasn't history of an amputation of the left leg.  Patient is a type II diabetic.  Patient is presently taking Xarelto  and Paxil.  Patient states that the nails have grown thick and long and are painful walking and wearing her shoes.  She presents the office today for preventative foot care services   General Appearance  Alert, conversant and in no acute stress.  Vascular  Dorsalis pedis and posterior tibial  pulses are palpable  right..  Capillary return is within normal limits right foot.. Temperature is within normal limits  Right foot.  Neurologic  Senn-Weinstein monofilament wire test within normal limits  . Muscle power within normal limits .  Nails Thick disfigured discolored nails with subungual debris  from hallux to fifth toes right. No evidence of bacterial infection or drainage   Orthopedic  No limitations of motion of motion feet .  No crepitus or effusions noted.  No bony pathology or digital deformities noted. Amputation left leg.  Skin  normotropic skin with no porokeratosis noted right.  No signs of infections or ulcers noted.    Onychomycosis  Amputation left leg.  Diabetes.  \   Debridement and grinding of nails right foot.   Gardiner Barefoot DPM

## 2017-08-12 ENCOUNTER — Telehealth: Payer: Self-pay | Admitting: *Deleted

## 2017-08-12 NOTE — Telephone Encounter (Signed)
-----   Message from Robert Bellow, MD sent at 08/12/2017  4:03 PM EDT ----- Please notify the patient that her testing came back from [place].  No indication for chemotherapy.  Will discuss at follow-up role of antiestrogen therapy.

## 2017-08-13 ENCOUNTER — Encounter: Payer: Self-pay | Admitting: General Surgery

## 2017-08-13 NOTE — Telephone Encounter (Signed)
Notified patient as instructed, patient pleased. Discussed follow-up appointments, patient agrees  

## 2017-08-14 ENCOUNTER — Other Ambulatory Visit: Payer: Self-pay

## 2017-08-14 ENCOUNTER — Ambulatory Visit
Admission: RE | Admit: 2017-08-14 | Discharge: 2017-08-14 | Disposition: A | Discharge status: Home / Self Care | Payer: Medicare Other | Source: Ambulatory Visit | Attending: Radiation Oncology | Admitting: Radiation Oncology

## 2017-08-14 ENCOUNTER — Encounter: Payer: Self-pay | Admitting: Radiation Oncology

## 2017-08-14 VITALS — BP 125/71 | HR 79 | Temp 96.9°F | Resp 18

## 2017-08-14 DIAGNOSIS — Z9889 Other specified postprocedural states: Secondary | ICD-10-CM | POA: Insufficient documentation

## 2017-08-14 DIAGNOSIS — Z8041 Family history of malignant neoplasm of ovary: Secondary | ICD-10-CM | POA: Insufficient documentation

## 2017-08-14 DIAGNOSIS — E1122 Type 2 diabetes mellitus with diabetic chronic kidney disease: Secondary | ICD-10-CM | POA: Diagnosis not present

## 2017-08-14 DIAGNOSIS — N189 Chronic kidney disease, unspecified: Secondary | ICD-10-CM | POA: Diagnosis not present

## 2017-08-14 DIAGNOSIS — C50412 Malignant neoplasm of upper-outer quadrant of left female breast: Secondary | ICD-10-CM | POA: Diagnosis not present

## 2017-08-14 DIAGNOSIS — Z9012 Acquired absence of left breast and nipple: Secondary | ICD-10-CM | POA: Insufficient documentation

## 2017-08-14 DIAGNOSIS — Z85828 Personal history of other malignant neoplasm of skin: Secondary | ICD-10-CM | POA: Insufficient documentation

## 2017-08-14 DIAGNOSIS — Z993 Dependence on wheelchair: Secondary | ICD-10-CM | POA: Diagnosis not present

## 2017-08-14 DIAGNOSIS — Z85038 Personal history of other malignant neoplasm of large intestine: Secondary | ICD-10-CM | POA: Diagnosis not present

## 2017-08-14 DIAGNOSIS — Z823 Family history of stroke: Secondary | ICD-10-CM | POA: Diagnosis not present

## 2017-08-14 DIAGNOSIS — Z9049 Acquired absence of other specified parts of digestive tract: Secondary | ICD-10-CM | POA: Insufficient documentation

## 2017-08-14 DIAGNOSIS — Z7901 Long term (current) use of anticoagulants: Secondary | ICD-10-CM | POA: Insufficient documentation

## 2017-08-14 DIAGNOSIS — Z803 Family history of malignant neoplasm of breast: Secondary | ICD-10-CM | POA: Diagnosis not present

## 2017-08-14 DIAGNOSIS — E785 Hyperlipidemia, unspecified: Secondary | ICD-10-CM | POA: Insufficient documentation

## 2017-08-14 DIAGNOSIS — Z79899 Other long term (current) drug therapy: Secondary | ICD-10-CM | POA: Insufficient documentation

## 2017-08-14 DIAGNOSIS — Z170 Estrogen receptor positive status [ER+]: Secondary | ICD-10-CM | POA: Diagnosis present

## 2017-08-14 DIAGNOSIS — Z7984 Long term (current) use of oral hypoglycemic drugs: Secondary | ICD-10-CM | POA: Insufficient documentation

## 2017-08-14 NOTE — Consult Note (Signed)
NEW PATIENT EVALUATION  Name: Julie Acosta  MRN: [id number]  Date:   08/14/2017     DOB: 03-24-1934   This 84 y.o. 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  REFERRING PHYSICIAN: Maryland Pink, MD  CHIEF COMPLAINT:  Chief Complaint  Patient presents with  . Breast Cancer    Pt is here for initial consultation of breast cancer     DIAGNOSIS: The encounter diagnosis was Malignant neoplasm of upper-outer quadrant of left breast in female, estrogen receptor positive ([place]).   PREVIOUS INVESTIGATIONS:  Mammograms and ultrasound reviewed Pathology reports reviewed Clinical notes reviewed  HPI: patient is an 84 year old female status post hemipelvectomy back in her 40s for malignant bone tumorwho presented with an abnormal mammogram of her left breast showing a area of distortion measuring 0.8 cm in the 1:00 position suspicious for malignancy. This was confirmed on ultrasound to be a 0.8 cm hypoechoic mass consistent with malignancy. She underwent ultrasound guided biopsy which was positive forinvasive mammary carcinoma. She subsequently underwent wide local excision showing 1.1 cm invasive mammary carcinoma overall grade 2.tumor was ER/PR positive HER-2/neu not overexpressed.one sentinel lymph node was negative.patient had additional tissue submitted the time of surgery all final margins were negative for invasive and in situ carcinoma. Patient had Oncotype DX performed showing low risk of recurrence. She is wheelchair-bound. She is seen today for consideration of adjuvant radiation therapy. She is doing well rest is still somewhat sore although healing nicely.  PLANNED TREATMENT REGIMEN: accelerated partial breast radiation  PAST MEDICAL HISTORY:  has a past medical history of Anxiety, Benign breast lumps, Blood clotting disorder  ([place]), Bone cancer ([place]), Cervicalgia, Chronic kidney disease, Colon cancer ([place]), Complication of anesthesia, Cough, Depression, Diabetes ([place]), Diverticulitis, Dizzy, GERD (gastroesophageal reflux disease), HBP (high blood pressure), Hematuria, Hemorrhoids, HLD (hyperlipidemia), Muscle pain, Osteoarthritis, Osteoporosis, Panic disorder, PND (post-nasal drip), Reflux, Skin cancer, Swelling, and Tremors of nervous system.    PAST SURGICAL HISTORY:  Past Surgical History:  Procedure Laterality Date  . APPENDECTOMY  1962  . BREAST BIOPSY Right 2006?   benign  . BREAST CYST EXCISION Left 07/27/2017   Procedure: SKIN CYST EXCISED;  Surgeon: Robert Bellow, MD;  Location: [facility] ORS;  Service: General;  Laterality: Left;  . BREAST SURGERY Right 2019  . CARPAL TUNNEL RELEASE Right   . CATARACT EXTRACTION W/PHACO Left 11/11/2016   Procedure: CATARACT EXTRACTION PHACO AND INTRAOCULAR LENS PLACEMENT (IOC);  Surgeon: Birder Robson, MD;  Location: [facility] ORS;  Service: Ophthalmology;  Laterality: Left;  [place] 01:07.9 AP% 19.2 CDE 13.02 Fluid pack lot # [id number]  . CATARACT EXTRACTION W/PHACO Right 12/09/2016   Procedure: CATARACT EXTRACTION PHACO AND INTRAOCULAR LENS PLACEMENT (IOC);  Surgeon: Birder Robson, MD;  Location: [facility] ORS;  Service: Ophthalmology;  Laterality: Right;  [place] 00:49 AP% 21.2 CDE 10.39 Fluid pack lot # [id number]  . CHOLECYSTECTOMY    . COLONOSCOPY WITH PROPOFOL N/A 11/22/2014   Procedure: COLONOSCOPY WITH PROPOFOL;  Surgeon: Manya Silvas, MD;  Location[facility] ENDOSCOPY;  Service: Endoscopy;  Laterality: N/A;  . ESOPHAGOGASTRODUODENOSCOPY  11/22/2014   Procedure: ESOPHAGOGASTRODUODENOSCOPY (EGD);  Surgeon: Manya Silvas, MD;  Location: [facility] ENDOSCOPY;  Service: Endoscopy;;  . ESOPHAGOGASTRODUODENOSCOPY (EGD) WITH PROPOFOL N/A 03/05/2017   Procedure: ESOPHAGOGASTRODUODENOSCOPY (EGD) WITH PROPOFOL;  Surgeon: Lin Landsman, MD;  Location: [facility] ENDOSCOPY;  Service:  Gastroenterology;  Laterality: N/A;  . GALLBLADDER SURGERY    . hemi pelvectomy    . HEMIPELVIC Left   . LEG SURGERY Left    AMPUTATION  . MASTECTOMY, PARTIAL Left 07/27/2017   Procedure: MASTECTOMY PARTIAL;  Surgeon: Robert Bellow, MD;  Location: [facility] ORS;  Service: General;  Laterality: Left;  . OVARY SURGERY Right   . SAVORY DILATION  11/22/2014   Procedure: SAVORY DILATION;  Surgeon: Manya Silvas, MD;  Location: [facility] ENDOSCOPY;  Service: Endoscopy;;  . SENTINEL NODE BIOPSY Left 07/27/2017   Procedure: SENTINEL NODE BIOPSY;  Surgeon: Robert Bellow, MD;  Location: [facility] ORS;  Service: General;  Laterality: Left;  . SKIN CANCER EXCISION    . TONSILLECTOMY      FAMILY HISTORY: family history includes Breast cancer in her paternal aunt; Ovarian cancer in her sister; Prostate cancer in her father; Stroke in her mother.  SOCIAL HISTORY:  reports that she has never smoked. She has never used smokeless tobacco. She reports that she does not drink alcohol or use drugs.  ALLERGIES: Ciprofloxacin; Codeine; Tape; Ciprocinonide [fluocinolone]; Amoxicillin; Cefuroxime; and Lipitor [atorvastatin]  MEDICATIONS:  Current Outpatient Medications  Medication Sig Dispense Refill  . acetaminophen (TYLENOL) 500 MG tablet Take 500 mg by mouth every 6 (six) hours as needed (for pain/headaches.).    Marland Kitchen fenofibrate 160 MG tablet Take 160 mg by mouth daily.     Marland Kitchen GLIPIZIDE XL 2.5 MG 24 hr tablet Take 2.5 mg by mouth daily.     Marland Kitchen losartan (COZAAR) 50 MG tablet Take 50 mg by mouth daily.     . metFORMIN (GLUCOPHAGE) 500 MG tablet Take 1,000 mg by mouth 2 (two) times daily.     . metoprolol succinate (TOPROL-XL) 25 MG 24 hr tablet Take 12.5 mg by mouth at bedtime.     Marland Kitchen PARoxetine (PAXIL) 40 MG tablet Take 40 mg by mouth daily.     Vladimir Faster Glycol-Propyl Glycol (SYSTANE OP) Place 1 drop into the right eye 2 (two) times daily as needed (dry eyes).    . raloxifene (EVISTA) 60 MG tablet Take 60 mg by  mouth daily.     . rivaroxaban (XARELTO) 20 MG TABS tablet Take 1 tablet (20 mg total) by mouth daily. 30 tablet 0   No current facility-administered medications for this encounter.     ECOG PERFORMANCE STATUS:  0 - Asymptomatic  REVIEW OF SYSTEMS: except for some exacerbation of arthritis Patient denies any weight loss, fatigue, weakness, fever, chills or night sweats. Patient denies any loss of vision, blurred vision. Patient denies any ringing  of the ears or hearing loss. No irregular heartbeat. Patient denies heart murmur or history of fainting. Patient denies any chest pain or pain radiating to her upper extremities. Patient denies any shortness of breath, difficulty breathing at night, cough or hemoptysis. Patient denies any swelling in the lower legs. Patient denies any nausea vomiting, vomiting of blood, or coffee ground material in the vomitus. Patient denies any stomach pain. Patient states has had normal bowel movements no significant constipation or diarrhea. Patient denies any dysuria, hematuria or significant nocturia. Patient denies any problems walking, swelling in the joints or loss of balance. Patient denies any skin changes, loss of hair or loss of weight. Patient denies any excessive worrying or anxiety or significant depression. Patient denies any problems with insomnia. Patient denies excessive thirst, polyuria, polydipsia. Patient denies any swollen glands, patient denies easy bruising or easy bleeding. Patient denies any recent infections,  allergies or URI. Patient "s visual fields have not changed significantly in recent time.    PHYSICAL EXAM: BP 125/71   Pulse 79   Temp (!) 96.9 F (36.1 C)   Resp 18  Eft breast showed wide local excision site well-healed. No dominant mass or nodularity is noted in either breast in 2 positions examined. No axillary or supraclavicular adenopathy is noted bilaterally. Patient is status post left hemipelvectomy. Well-developed  well-nourished patient in NAD. HEENT reveals PERLA, EOMI, discs not visualized.  Oral cavity is clear. No oral mucosal lesions are identified. Neck is clear without evidence of cervical or supraclavicular adenopathy. Lungs are clear to A&P. Cardiac examination is essentially unremarkable with regular rate and rhythm without murmur rub or thrill. Abdomen is benign with no organomegaly or masses noted. Motor sensory and DTR levels are equal and symmetric in the upper and lower extremities. Cranial nerves II through XII are grossly intact. Proprioception is intact. No peripheral adenopathy or edema is identified. No motor or sensory levels are noted. Crude visual fields are within normal range.  LABORATORY DATA: pathology reports reviewed    RADIOLOGY RESULTS:ammogram and ultrasound reviewed   IMPRESSION: stage I ER/PR positive invasive mammary carcinoma the left breast status post wide local excision in 84 year old female  PLAN: based on the patient's difficulty with ambulation secondary to left hemipelvectomyher age pathologic stage I believe she would be an excellent candidate for accelerated partial breast radiation. Risks and benefits of MammoSite catheter placement as well as side effects such as skin reaction fatigue and permanent thickening of her lumpectomy site all were discussed in detail with the patient. We will arrange for her to have MammoSite balloon placed as well as follow-up BrachyVision treatment planning. We will plan on delivering 3400 cGy in 10 fractions at 340 C twice a day using iridium 192 high dose rate remote afterloading. We will coordinate with Dr. Barbette Hair office with balloon placement and follow-up scans. Patient also will be candidate for antiestrogen therapy after completion of radiation.  I would like to take this opportunity to thank you for allowing me to participate in the care of your patient.Noreene Filbert, MD

## 2017-08-18 ENCOUNTER — Telehealth: Payer: Self-pay | Admitting: *Deleted

## 2017-08-18 MED ORDER — SULFAMETHOXAZOLE-TRIMETHOPRIM 800-160 MG PO TABS: 1.0000 | ORAL_TABLET | Freq: Two times a day (BID) | ORAL | 0 refills | Status: DC

## 2017-08-18 NOTE — Telephone Encounter (Signed)
Mammosite schedule reviewed with the patient Placement   09-01-17  at [facility] at 8:00 Scan 09-03-17 Treat 3, 6-9 Aware the [facility] will be calling her for more details Aware of ATB and directions reviewed. Aware no showers and to wear her bra while mammosite in place. Pt agrees.

## 2017-08-24 ENCOUNTER — Telehealth: Payer: Self-pay | Admitting: *Deleted

## 2017-08-24 NOTE — Telephone Encounter (Signed)
Patient is having a mammosite placed on 09/01/17 and wanted to just let you know that she had a steroid injection done today in her right knee.

## 2017-08-31 ENCOUNTER — Encounter: Payer: Self-pay | Admitting: *Deleted

## 2017-08-31 NOTE — Progress Notes (Signed)
Patient had called the answering service during lunch and was requesting a call back.   Gaspar Cola, CMA returned the patient's call this afternoon. Patient states she did fall over the weekend on her bottom. Also, the patient reported to her that she is on Xarelto and that no one told her to discontinue her medication prior to [facility] placement. Patient did take Xarelto already today.   Dr. Bary Castilla informed of the above.   The patient was contacted again this afternoon to see if she could lay on her back for 45 minutes for procedure. The patient states she can do this as long as she has a pillow behind her back. We will proceed tomorrow with mammosite placement as scheduled. She was reminded to take her antibiotic one hour prior to procedure. The patient was also instructed to hold usual Xarelto dose tomorrow morning but could take all other regular medications. Patient verbalizes understanding.

## 2017-09-01 ENCOUNTER — Ambulatory Visit: Payer: Self-pay

## 2017-09-01 ENCOUNTER — Encounter: Payer: Self-pay | Admitting: General Surgery

## 2017-09-01 ENCOUNTER — Ambulatory Visit (INDEPENDENT_AMBULATORY_CARE_PROVIDER_SITE_OTHER): Payer: Medicare Other | Admitting: General Surgery

## 2017-09-01 VITALS — BP 136/64 | HR 96 | Resp 18 | Ht 61.0 in | Wt 163.0 lb

## 2017-09-01 DIAGNOSIS — Z170 Estrogen receptor positive status [ER+]: Secondary | ICD-10-CM | POA: Diagnosis not present

## 2017-09-01 DIAGNOSIS — C50412 Malignant neoplasm of upper-outer quadrant of left female breast: Secondary | ICD-10-CM

## 2017-09-01 NOTE — Progress Notes (Signed)
Patient ID: Julie Acosta, female   DOB: 02/02/1934, 83 y.o.   MRN: [id number]  Chief Complaint  Patient presents with  . Procedure    HPI Julie Acosta is a 83 y.o. female.  Her for left mammosite placement.  The patient has met with radiation oncology and determined that she would like to proceed with accelerated partial breast radiation. She states she fell Sunday.  For the first time she fell out of her wheelchair while trying to adjust the blinds.  Bruise to the left gluteal area.  No head trauma.  HPI  Past Medical History:  Diagnosis Date  . Anxiety   . Benign breast lumps   . Blood clotting disorder ([facility])   . Bone cancer ([facility])   . Cervicalgia   . Chronic kidney disease    kidney stones  . Colon cancer ([facility])   . Complication of anesthesia    MOOD ALTERATION / UNSURE IF ANES OR PAIN MED. NO TROUBLE WITH MOH'S  . Cough    NASAL DRIP / SNEEZING / SORE THROAT MOSTLY CONSTANT  . Depression   . Diabetes ([facility])   . Diverticulitis   . Dizzy   . GERD (gastroesophageal reflux disease)   . HBP (high blood pressure)   . Hematuria    gross  . Hemorrhoids   . HLD (hyperlipidemia)   . Muscle pain   . Osteoarthritis   . Osteoporosis   . Panic disorder   . PND (post-nasal drip)    CHRONIC WITH SORE THROAT AND SNEEZING  . Reflux   . Skin cancer   . Swelling   . Tremors of nervous system     Past Surgical History:  Procedure Laterality Date  . APPENDECTOMY  1962  . BREAST BIOPSY Right 2006?   benign  . BREAST CYST EXCISION Left 07/27/2017   Procedure: SKIN CYST EXCISED;  Surgeon: ,  W, MD;  Location: [facility] ORS;  Service: General;  Laterality: Left;  . BREAST SURGERY Right 2019  . CARPAL TUNNEL RELEASE Right   . CATARACT EXTRACTION W/PHACO Left 11/11/2016   Procedure: CATARACT EXTRACTION PHACO AND INTRAOCULAR LENS PLACEMENT (IOC);  Surgeon: Porfilio, William, MD;  Location: [facility] ORS;  Service: Ophthalmology;  Laterality: Left;  [place] 01:07.9 AP% 19.2 CDE  13.02 Fluid pack lot # [id number]  . CATARACT EXTRACTION W/PHACO Right 12/09/2016   Procedure: CATARACT EXTRACTION PHACO AND INTRAOCULAR LENS PLACEMENT (IOC);  Surgeon: Porfilio, William, MD;  Location: [facility] ORS;  Service: Ophthalmology;  Laterality: Right;  [place] 00:49 AP% 21.2 CDE 10.39 Fluid pack lot # [id number]  . CHOLECYSTECTOMY    . COLONOSCOPY WITH PROPOFOL N/A 11/22/2014   Procedure: COLONOSCOPY WITH PROPOFOL;  Surgeon: Robert T Elliott, MD;  Location: [facility] ENDOSCOPY;  Service: Endoscopy;  Laterality: N/A;  . ESOPHAGOGASTRODUODENOSCOPY  11/22/2014   Procedure: ESOPHAGOGASTRODUODENOSCOPY (EGD);  Surgeon: Robert T Elliott, MD;  Location: [facility] ENDOSCOPY;  Service: Endoscopy;;  . ESOPHAGOGASTRODUODENOSCOPY (EGD) WITH PROPOFOL N/A 03/05/2017   Procedure: ESOPHAGOGASTRODUODENOSCOPY (EGD) WITH PROPOFOL;  Surgeon: Vanga, Rohini Reddy, MD;  Location: [facility] ENDOSCOPY;  Service: Gastroenterology;  Laterality: N/A;  . GALLBLADDER SURGERY    . hemi pelvectomy    . HEMIPELVIC Left   . LEG SURGERY Left    AMPUTATION  . MASTECTOMY, PARTIAL Left 07/27/2017   Procedure: MASTECTOMY PARTIAL;  Surgeon: ,  W, MD;  Location: [facility] ORS;  Service: General;  Laterality: Left;  . OVARY SURGERY Right   . SAVORY DILATION  11/22/2014   Procedure: SAVORY DILATION;    Surgeon: Robert T Elliott, MD;  Location: [facility] ENDOSCOPY;  Service: Endoscopy;;  . SENTINEL NODE BIOPSY Left 07/27/2017   Procedure: SENTINEL NODE BIOPSY;  Surgeon: ,  W, MD;  Location: [facility] ORS;  Service: General;  Laterality: Left;  . SKIN CANCER EXCISION    . TONSILLECTOMY      Family History  Problem Relation Age of Onset  . Breast cancer Paternal Aunt   . Ovarian cancer Sister   . Prostate cancer Father   . Stroke Mother   . Kidney cancer Neg Hx   . Bladder Cancer Neg Hx     Social History Social History   Tobacco Use  . Smoking status: Never Smoker  . Smokeless tobacco: Never Used  Substance Use Topics  . Alcohol  use: No  . Drug use: No    Allergies  Allergen Reactions  . Ciprofloxacin Hives  . Codeine Other (See Comments)    HALLUCINATIONS  . Tape     Rash and skin irritation/ paper tape and tegaderm OK  . Ciprocinonide [Fluocinolone] Other (See Comments)    unknown  . Amoxicillin Other (See Comments)    Upset stomach Has patient had a PCN reaction causing immediate rash, facial/tongue/throat swelling, SOB or lightheadedness with hypotension: No Has patient had a PCN reaction causing severe rash involving mucus membranes or skin necrosis: No Has patient had a PCN reaction that required hospitalization: No Has patient had a PCN reaction occurring within the last 10 years: Yes If all of the above answers are "NO", then may proceed with Cephalosporin use.;  . Cefuroxime Other (See Comments)    OK INTRACAMERALLY PER DR WLP, upset stomach  . Lipitor [Atorvastatin] Other (See Comments)    unknown    Current Outpatient Medications  Medication Sig Dispense Refill  . acetaminophen (TYLENOL) 500 MG tablet Take 500 mg by mouth every 6 (six) hours as needed (for pain/headaches.).    . fenofibrate 160 MG tablet Take 160 mg by mouth daily.     . GLIPIZIDE XL 2.5 MG 24 hr tablet Take 2.5 mg by mouth daily.     . losartan (COZAAR) 50 MG tablet Take 50 mg by mouth daily.     . metFORMIN (GLUCOPHAGE) 500 MG tablet Take 1,000 mg by mouth 2 (two) times daily.     . metoprolol succinate (TOPROL-XL) 25 MG 24 hr tablet Take 12.5 mg by mouth at bedtime.     . PARoxetine (PAXIL) 40 MG tablet Take 40 mg by mouth daily.     . Polyethyl Glycol-Propyl Glycol (SYSTANE OP) Place 1 drop into the right eye 2 (two) times daily as needed (dry eyes).    . raloxifene (EVISTA) 60 MG tablet Take 60 mg by mouth daily.     . rivaroxaban (XARELTO) 20 MG TABS tablet Take 1 tablet (20 mg total) by mouth daily. 30 tablet 0  . sulfamethoxazole-trimethoprim (BACTRIM DS) 800-160 MG tablet Take 1 tablet by mouth 2 (two) times daily.  Start taking one hour before office procedure on 09-01-17 30 tablet 0   No current facility-administered medications for this visit.     Review of Systems Review of Systems  Constitutional: Negative.   Respiratory: Negative.   Cardiovascular: Negative.     Blood pressure 136/64, pulse 96, resp. rate 18, height 5' 1" (1.549 m), weight 163 lb (73.9 kg), SpO2 95 %.  Physical Exam Physical Exam  Constitutional: She is oriented to person, place, and time. She appears well-developed and well-nourished.  Pulmonary/Chest:      Neurological: She is alert and oriented to person, place, and time.  Skin: Skin is warm and dry.  Psychiatric: She has a normal mood and affect.    Data Reviewed The procedure was reviewed and the patient was amenable to proceed.  She had made use of her Duricef as requested.  The skin was cleansed with alcohol followed by 10 cc of 0.5% Xylocaine with 0.25% Marcaine with 1 to 200,000 units of epinephrine.  ChloraPrep was applied to the skin x3.  An 11 blade was used to make a small incision lateral to the wide excision site.  The 8 mm trocar was advanced under ultrasound guidance with drainage of about 5-10 cc of odorless seroma fluid.  The cavity evaluation device was inflated and showed symmetrical expansion.  This was removed and the treatment balloon placed and inflated to 60 cc.  Minimal spacing to the skin 1.73 cm.  The exit site was treated with bacitracin ointment followed by dry dressing.  Patient tolerated the procedure well.  Oncotype DX score: 1.  No indication for adjuvant chemotherapy.  Assessment    Successful MammoSite balloon placement.    Plan       Wound care was reviewed with the patient by the nurse.  Follow up in 2 weeks.  HPI, Physical Exam, Assessment and Plan have been scribed under the direction and in the presence of Robert Bellow, MD. Karie Fetch, RN  I have completed the exam and reviewed the above documentation for accuracy  and completeness.  I agree with the above.  Haematologist has been used and any errors in dictation or transcription are unintentional.  Hervey Ard, M.D., F.A.C.S.  Forest Gleason Kimberly Coye 09/02/2017, 5:58 PM

## 2017-09-01 NOTE — Progress Notes (Deleted)
Patient ID: Julie Acosta, female   DOB: [birth year], 84 y.o.   MRN: [id number]  Chief Complaint  Patient presents with  . Procedure    HPI Julie Acosta is a 84 y.o. female.  Here for left mammosite.  HPI  Past Medical History:  Diagnosis Date  . Anxiety   . Benign breast lumps   . Blood clotting disorder ([place])   . Bone cancer ([place])   . Cervicalgia   . Chronic kidney disease    kidney stones  . Colon cancer ([place])   . Complication of anesthesia    MOOD ALTERATION / UNSURE IF ANES OR PAIN MED. NO TROUBLE WITH MOH'S  . Cough    NASAL DRIP / SNEEZING / SORE THROAT MOSTLY CONSTANT  . Depression   . Diabetes ([place])   . Diverticulitis   . Dizzy   . GERD (gastroesophageal reflux disease)   . HBP (high blood pressure)   . Hematuria    gross  . Hemorrhoids   . HLD (hyperlipidemia)   . Muscle pain   . Osteoarthritis   . Osteoporosis   . Panic disorder   . PND (post-nasal drip)    CHRONIC WITH SORE THROAT AND SNEEZING  . Reflux   . Skin cancer   . Swelling   . Tremors of nervous system     Past Surgical History:  Procedure Laterality Date  . APPENDECTOMY  1962  . BREAST BIOPSY Right 2006?   benign  . BREAST CYST EXCISION Left 07/27/2017   Procedure: SKIN CYST EXCISED;  Surgeon: Robert Bellow, MD;  Location: [facility] ORS;  Service: General;  Laterality: Left;  . BREAST SURGERY Right 2019  . CARPAL TUNNEL RELEASE Right   . CATARACT EXTRACTION W/PHACO Left 11/11/2016   Procedure: CATARACT EXTRACTION PHACO AND INTRAOCULAR LENS PLACEMENT (IOC);  Surgeon: Birder Robson, MD;  Location: [facility] ORS;  Service: Ophthalmology;  Laterality: Left;  [place] 01:07.9 AP% 19.2 CDE 13.02 Fluid pack lot # [id number]  . CATARACT EXTRACTION W/PHACO Right 12/09/2016   Procedure: CATARACT EXTRACTION PHACO AND INTRAOCULAR LENS PLACEMENT (IOC);  Surgeon: Birder Robson, MD;  Location: [facility] ORS;  Service: Ophthalmology;  Laterality: Right;  [place] 00:49 AP% 21.2 CDE 10.39 Fluid pack lot #  [id number]  . CHOLECYSTECTOMY    . COLONOSCOPY WITH PROPOFOL N/A 11/22/2014   Procedure: COLONOSCOPY WITH PROPOFOL;  Surgeon: Manya Silvas, MD;  Location: [facility] ENDOSCOPY;  Service: Endoscopy;  Laterality: N/A;  . ESOPHAGOGASTRODUODENOSCOPY  11/22/2014   Procedure: ESOPHAGOGASTRODUODENOSCOPY (EGD);  Surgeon: Manya Silvas, MD;  Location: [facility] ENDOSCOPY;  Service: Endoscopy;;  . ESOPHAGOGASTRODUODENOSCOPY (EGD) WITH PROPOFOL N/A 03/05/2017   Procedure: ESOPHAGOGASTRODUODENOSCOPY (EGD) WITH PROPOFOL;  Surgeon: Lin Landsman, MD;  Location: [facility] ENDOSCOPY;  Service: Gastroenterology;  Laterality: N/A;  . GALLBLADDER SURGERY    . hemi pelvectomy    . HEMIPELVIC Left   . LEG SURGERY Left    AMPUTATION  . MASTECTOMY, PARTIAL Left 07/27/2017   Procedure: MASTECTOMY PARTIAL;  Surgeon: Robert Bellow, MD;  Location: [facility] ORS;  Service: General;  Laterality: Left;  . OVARY SURGERY Right   . SAVORY DILATION  11/22/2014   Procedure: SAVORY DILATION;  Surgeon: Manya Silvas, MD;  Location[facility]COPY;  Service: Endoscopy;;  . SENTINEL NODE BIOPSY Left 07/27/2017   Procedure: SENTINEL NODE BIOPSY;  Surgeon: Robert Bellow, MD;  Location: [facility] ORS;  Service: General;  Laterality: Left;  . SKIN CANCER EXCISION    . TONSILLECTOMY  Family History  Problem Relation Age of Onset  . Breast cancer Paternal Aunt   . Ovarian cancer Sister   . Prostate cancer Father   . Stroke Mother   . Kidney cancer Neg Hx   . Bladder Cancer Neg Hx     Social History Social History   Tobacco Use  . Smoking status: Never Smoker  . Smokeless tobacco: Never Used  Substance Use Topics  . Alcohol use: No  . Drug use: No    Allergies  Allergen Reactions  . Ciprofloxacin Hives  . Codeine Other (See Comments)    HALLUCINATIONS  . Tape     Rash and skin irritation/ paper tape and tegaderm OK  . Ciprocinonide [Fluocinolone] Other (See Comments)    unknown  . Amoxicillin Other (See  Comments)    Upset stomach Has patient had a PCN reaction causing immediate rash, facial/tongue/throat swelling, SOB or lightheadedness with hypotension: No Has patient had a PCN reaction causing severe rash involving mucus membranes or skin necrosis: No Has patient had a PCN reaction that required hospitalization: No Has patient had a PCN reaction occurring within the last 10 years: Yes If all of the above answers are "NO", then may proceed with Cephalosporin use.;  . Cefuroxime Other (See Comments)    OK INTRACAMERALLY PER DR WLP, upset stomach  . Lipitor [Atorvastatin] Other (See Comments)    unknown    Current Outpatient Medications  Medication Sig Dispense Refill  . acetaminophen (TYLENOL) 500 MG tablet Take 500 mg by mouth every 6 (six) hours as needed (for pain/headaches.).    Marland Kitchen fenofibrate 160 MG tablet Take 160 mg by mouth daily.     Marland Kitchen GLIPIZIDE XL 2.5 MG 24 hr tablet Take 2.5 mg by mouth daily.     Marland Kitchen losartan (COZAAR) 50 MG tablet Take 50 mg by mouth daily.     . metFORMIN (GLUCOPHAGE) 500 MG tablet Take 1,000 mg by mouth 2 (two) times daily.     . metoprolol succinate (TOPROL-XL) 25 MG 24 hr tablet Take 12.5 mg by mouth at bedtime.     Marland Kitchen PARoxetine (PAXIL) 40 MG tablet Take 40 mg by mouth daily.     Vladimir Faster Glycol-Propyl Glycol (SYSTANE OP) Place 1 drop into the right eye 2 (two) times daily as needed (dry eyes).    . raloxifene (EVISTA) 60 MG tablet Take 60 mg by mouth daily.     . rivaroxaban (XARELTO) 20 MG TABS tablet Take 1 tablet (20 mg total) by mouth daily. 30 tablet 0  . sulfamethoxazole-trimethoprim (BACTRIM DS) 800-160 MG tablet Take 1 tablet by mouth 2 (two) times daily. Start taking one hour before office procedure on 09-01-17 30 tablet 0   No current facility-administered medications for this visit.     Review of Systems Review of Systems  Blood pressure 136/64, pulse 96, resp. rate 18, height 5\' 1"  (1.549 m), weight 163 lb (73.9 kg), SpO2 95  %.  Physical Exam Physical Exam  Constitutional: She is oriented to person, place, and time. She appears well-developed and well-nourished.  Neurological: She is alert and oriented to person, place, and time.  Skin: Skin is warm and dry.  Psychiatric: She has a normal mood and affect.    Data Reviewed ***  Assessment    ***    Plan         HPI, Physical Exam, Assessment and Plan have been scribed under the direction and in the presence of Robert Bellow,  MD. Karie Fetch, RN  Karie Fetch M 09/01/2017, 8:09 AM

## 2017-09-01 NOTE — Patient Instructions (Addendum)
The patient is aware to call back for any questions or concerns. Patient care kit given to patient.  Instructed no showers, sponge bath while mammosite in place, take antibiotic. Follow up with [facility] as arranged. Discussed wearing your bra for support at all times.  

## 2017-09-03 ENCOUNTER — Ambulatory Visit
Admission: RE | Admit: 2017-09-03 | Discharge: 2017-09-03 | Disposition: A | Discharge status: Home / Self Care | Payer: Medicare Other | Source: Ambulatory Visit | Attending: Radiation Oncology | Admitting: Radiation Oncology

## 2017-09-03 DIAGNOSIS — Z170 Estrogen receptor positive status [ER+]: Secondary | ICD-10-CM | POA: Diagnosis not present

## 2017-09-03 DIAGNOSIS — Z510 Encounter for antineoplastic radiation therapy: Secondary | ICD-10-CM | POA: Diagnosis not present

## 2017-09-03 DIAGNOSIS — C50412 Malignant neoplasm of upper-outer quadrant of left female breast: Secondary | ICD-10-CM | POA: Insufficient documentation

## 2017-09-04 ENCOUNTER — Ambulatory Visit
Admission: RE | Admit: 2017-09-04 | Discharge: 2017-09-04 | Disposition: A | Discharge status: Home / Self Care | Payer: Medicare Other | Source: Ambulatory Visit | Attending: Radiation Oncology | Admitting: Radiation Oncology

## 2017-09-04 DIAGNOSIS — Z510 Encounter for antineoplastic radiation therapy: Secondary | ICD-10-CM | POA: Diagnosis not present

## 2017-09-07 ENCOUNTER — Ambulatory Visit
Admission: RE | Admit: 2017-09-07 | Discharge: 2017-09-07 | Disposition: A | Discharge status: Home / Self Care | Payer: Medicare Other | Source: Ambulatory Visit | Attending: Radiation Oncology | Admitting: Radiation Oncology

## 2017-09-07 DIAGNOSIS — Z510 Encounter for antineoplastic radiation therapy: Secondary | ICD-10-CM | POA: Diagnosis not present

## 2017-09-08 ENCOUNTER — Ambulatory Visit
Admission: RE | Admit: 2017-09-08 | Discharge: 2017-09-08 | Disposition: A | Discharge status: Home / Self Care | Payer: Medicare Other | Source: Ambulatory Visit | Attending: Radiation Oncology | Admitting: Radiation Oncology

## 2017-09-08 ENCOUNTER — Ambulatory Visit: Payer: Medicare Other | Admitting: General Surgery

## 2017-09-08 ENCOUNTER — Ambulatory Visit (INDEPENDENT_AMBULATORY_CARE_PROVIDER_SITE_OTHER): Payer: Medicare Other

## 2017-09-08 ENCOUNTER — Encounter: Payer: Self-pay | Admitting: General Surgery

## 2017-09-08 VITALS — BP 124/60 | HR 95 | Resp 18 | Ht 61.0 in | Wt 163.0 lb

## 2017-09-08 DIAGNOSIS — C50412 Malignant neoplasm of upper-outer quadrant of left female breast: Secondary | ICD-10-CM

## 2017-09-08 DIAGNOSIS — Z510 Encounter for antineoplastic radiation therapy: Secondary | ICD-10-CM | POA: Diagnosis not present

## 2017-09-08 DIAGNOSIS — Z170 Estrogen receptor positive status [ER+]: Secondary | ICD-10-CM | POA: Diagnosis not present

## 2017-09-08 NOTE — Progress Notes (Signed)
Patient ID: Julie Acosta, female   DOB: 15-May-1933, 84 y.o.   MRN: [id number]  Chief Complaint  Patient presents with  . Follow-up    HPI Julie Acosta is a 84 y.o. female here today for evalaution of the left Mammosite placement. She was at her radiation appointment when she states they pulled the afterloading device away prior to disconnecting her and she had a sharp pull on the balloon.  In speaking with the radiation oncologist since that time they have not been able to recannulate the central channel.  HPI  Past Medical History:  Diagnosis Date  . Anxiety   . Benign breast lumps   . Blood clotting disorder ([place])   . Bone cancer ([place])   . Cervicalgia   . Chronic kidney disease    kidney stones  . Colon cancer ([place])   . Complication of anesthesia    MOOD ALTERATION / UNSURE IF ANES OR PAIN MED. NO TROUBLE WITH MOH'S  . Cough    NASAL DRIP / SNEEZING / SORE THROAT MOSTLY CONSTANT  . Depression   . Diabetes ([place])   . Diverticulitis   . Dizzy   . GERD (gastroesophageal reflux disease)   . HBP (high blood pressure)   . Hematuria    gross  . Hemorrhoids   . HLD (hyperlipidemia)   . Muscle pain   . Osteoarthritis   . Osteoporosis   . Panic disorder   . PND (post-nasal drip)    CHRONIC WITH SORE THROAT AND SNEEZING  . Reflux   . Skin cancer   . Swelling   . Tremors of nervous system     Past Surgical History:  Procedure Laterality Date  . APPENDECTOMY  1962  . BREAST BIOPSY Right 2006?   benign  . BREAST CYST EXCISION Left 07/27/2017   Procedure: SKIN CYST EXCISED;  Surgeon: Robert Bellow, MD;  Location: [facility] ORS;  Service: General;  Laterality: Left;  . BREAST SURGERY Right 2019  . CARPAL TUNNEL RELEASE Right   . CATARACT EXTRACTION W/PHACO Left 11/11/2016   Procedure: CATARACT EXTRACTION PHACO AND INTRAOCULAR LENS PLACEMENT (IOC);  Surgeon: Birder Robson, MD;  Location: [facility] ORS;  Service: Ophthalmology;  Laterality: Left;  [place] 01:07.9 AP% 19.2 CDE  13.02 Fluid pack lot # [id number]  . CATARACT EXTRACTION W/PHACO Right 12/09/2016   Procedure: CATARACT EXTRACTION PHACO AND INTRAOCULAR LENS PLACEMENT (IOC);  Surgeon: Birder Robson, MD;  Location: [facility] ORS;  Service: Ophthalmology;  Laterality: Right;  [place] 00:49 AP% 21.2 CDE 10.39 Fluid pack lot # [id number]  . CHOLECYSTECTOMY    . COLONOSCOPY WITH PROPOFOL N/A 11/22/2014   Procedure: COLONOSCOPY WITH PROPOFOL;  Surgeon: Manya Silvas, MD;  Location[facility] END[facility]COPY;  Service: Endoscopy;  Laterality: N/A;  . ESOPHAGOGASTRODUODENOSCOPY  11/22/2014   Procedure: ESOPHAGOGASTRODUODENOSCOPY (EGD);  Surgeon: Manya Silvas, MD;  Location: [facility] ENDOSCOPY;  Service: Endoscopy;;  . ESOPHAGOGASTRODUODENOSCOPY (EGD) WITH PROPOFOL N/A 03/05/2017   Procedure: ESOPHAGOGASTRODUODENOSCOPY (EGD) WITH PROPOFOL;  Surgeon: Lin Landsman, MD;  Location: [facility] ENDOSCOPY;  Service: Gastroenterology;  Laterality: N/A;  . GALLBLADDER SURGERY    . hemi pelvectomy    . HEMIPELVIC Left   . LEG SURGERY Left    AMPUTATION  . MASTECTOMY, PARTIAL Left 07/27/2017   Procedure: MASTECTOMY PARTIAL;  Surgeon: Robert Bellow, MD;  Location: [facility] ORS;  Service: General;  Laterality: Left;  . OVARY SURGERY Right   . SAVORY DILATION  11/22/2014   Procedure: SAVORY DILATION;  Surgeon: Herbie Baltimore  Federico Flake, MD;  Location: [facility] ENDOSCOPY;  Service: Endoscopy;;  . SENTINEL NODE BIOPSY Left 07/27/2017   Procedure: SENTINEL NODE BIOPSY;  Surgeon: Robert Bellow, MD;  Location: [facility] ORS;  Service: General;  Laterality: Left;  . SKIN CANCER EXCISION    . TONSILLECTOMY      Family History  Problem Relation Age of Onset  . Breast cancer Paternal Aunt   . Ovarian cancer Sister   . Prostate cancer Father   . Stroke Mother   . Kidney cancer Neg Hx   . Bladder Cancer Neg Hx     Social History Social History   Tobacco Use  . Smoking status: Never Smoker  . Smokeless tobacco: Never Used  Substance Use Topics  . Alcohol  use: No  . Drug use: No    Allergies  Allergen Reactions  . Ciprofloxacin Hives  . Codeine Other (See Comments)    HALLUCINATIONS  . Tape     Rash and skin irritation/ paper tape and tegaderm OK  . Ciprocinonide [Fluocinolone] Other (See Comments)    unknown  . Amoxicillin Other (See Comments)    Upset stomach Has patient had a PCN reaction causing immediate rash, facial/tongue/throat swelling, SOB or lightheadedness with hypotension: No Has patient had a PCN reaction causing severe rash involving mucus membranes or skin necrosis: No Has patient had a PCN reaction that required hospitalization: No Has patient had a PCN reaction occurring within the last 10 years: Yes If all of the above answers are "NO", then may proceed with Cephalosporin use.;  . Cefuroxime Other (See Comments)    OK INTRACAMERALLY PER DR WLP, upset stomach  . Lipitor [Atorvastatin] Other (See Comments)    unknown    Current Outpatient Medications  Medication Sig Dispense Refill  . acetaminophen (TYLENOL) 500 MG tablet Take 500 mg by mouth every 6 (six) hours as needed (for pain/headaches.).    Marland Kitchen fenofibrate 160 MG tablet Take 160 mg by mouth daily.     Marland Kitchen GLIPIZIDE XL 2.5 MG 24 hr tablet Take 2.5 mg by mouth daily.     Marland Kitchen losartan (COZAAR) 50 MG tablet Take 50 mg by mouth daily.     . metFORMIN (GLUCOPHAGE) 500 MG tablet Take 1,000 mg by mouth 2 (two) times daily.     . metoprolol succinate (TOPROL-XL) 25 MG 24 hr tablet Take 12.5 mg by mouth at bedtime.     Marland Kitchen PARoxetine (PAXIL) 40 MG tablet Take 40 mg by mouth daily.     Vladimir Faster Glycol-Propyl Glycol (SYSTANE OP) Place 1 drop into the right eye 2 (two) times daily as needed (dry eyes).    . raloxifene (EVISTA) 60 MG tablet Take 60 mg by mouth daily.     . rivaroxaban (XARELTO) 20 MG TABS tablet Take 1 tablet (20 mg total) by mouth daily. 30 tablet 0  . sulfamethoxazole-trimethoprim (BACTRIM DS) 800-160 MG tablet Take 1 tablet by mouth 2 (two) times daily.  Start taking one hour before office procedure on 09-01-17 30 tablet 0   No current facility-administered medications for this visit.     Review of Systems Review of Systems  Constitutional: Negative.   Respiratory: Negative.   Cardiovascular: Negative.     Blood pressure 124/60, pulse 95, resp. rate 18, height 5\' 1"  (1.549 m), weight 163 lb (73.9 kg).  Physical Exam Physical Exam  Constitutional: She is oriented to person, place, and time. She appears well-developed and well-nourished.  Neurological: She is alert and oriented  to person, place, and time.  Skin: Skin is warm and dry.  Psychiatric: Her behavior is normal.  No evidence of infection involving the left breast.  Data Reviewed Ultrasound examination prior to removal of the MammoSite balloon showed her to be spherical and in the location previously placed.  In light of the fact that the central channel is no longer intact for insertion of the iridium seed, a new balloon would be placed.  Procedure was reviewed with the patient and she was amenable to proceed.  The area was cleansed with ChloraPrep and 10 cc of 0.5% Xylocaine with 0.25% Marcaine with 1-200,000 notes of epinephrine was utilized and well-tolerated.  The original balloon was deflated without incident.  The area was recleansed with ChloraPrep and draped.  A new MammoSite balloon was placed and inflated with 60 cc of a mixture of saline and Omnipaque.  Prior to placement it was inflated to 70 cc and was shown to be spherical.  Postplacement imaging showed the balloon to be at a minimal distance of 1.22 cm from the skin and appeared to be perfectly spherical.  Bacitracin ointment was applied to the skin exit site followed by dry dressing.  Assessment    Disruption of MammoSite balloon, replaced.    Plan    Follow up as scheduled tomorrow morning for resumption of her accelerated partial breast radiation.  She will follow-up here next week as originally  scheduled for post treatment evaluation.     HPI, Physical Exam, Assessment and Plan have been scribed under the direction and in the presence of Robert Bellow, MD. Karie Fetch, RN  I have completed the exam and reviewed the above documentation for accuracy and completeness.  I agree with the above.  Haematologist has been used and any errors in dictation or transcription are unintentional.  Hervey Ard, M.D., F.A.C.S.  Forest Gleason Murl Zogg 09/08/2017, 5:26 PM

## 2017-09-08 NOTE — Patient Instructions (Signed)
The patient is aware to call back for any questions or concerns.  

## 2017-09-09 ENCOUNTER — Ambulatory Visit
Admission: RE | Admit: 2017-09-09 | Discharge: 2017-09-09 | Disposition: A | Discharge status: Home / Self Care | Payer: Medicare Other | Source: Ambulatory Visit | Attending: Radiation Oncology | Admitting: Radiation Oncology

## 2017-09-09 DIAGNOSIS — Z510 Encounter for antineoplastic radiation therapy: Secondary | ICD-10-CM | POA: Diagnosis not present

## 2017-09-10 ENCOUNTER — Ambulatory Visit
Admission: RE | Admit: 2017-09-10 | Discharge: 2017-09-10 | Disposition: A | Discharge status: Home / Self Care | Payer: Medicare Other | Source: Ambulatory Visit | Attending: Radiation Oncology | Admitting: Radiation Oncology

## 2017-09-10 DIAGNOSIS — Z510 Encounter for antineoplastic radiation therapy: Secondary | ICD-10-CM | POA: Diagnosis not present

## 2017-09-14 NOTE — Progress Notes (Signed)
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  Oncotype DX score 1.  INTERVAL HISTORY: Patient returns to clinic today for further evaluation and initiation of an aromatase inhibitor.  She underwent lumpectomy on July 27, 2017 and recently completed MammoSite XRT.  She continues to have some mild breast tenderness and erythema, but otherwise feels well. She has no neurologic complaints.  She denies any recent fevers or illnesses.  She has a good appetite and denies weight loss.  She has no chest pain or shortness of breath.  She denies any nausea, vomiting, constipation, or diarrhea.  She has no urinary complaints.  Patient offers no further specific complaints today.  REVIEW OF SYSTEMS:   Review of Systems  Constitutional: Negative.  Negative for fever, malaise/fatigue and weight loss.  Respiratory: Negative.  Negative for cough and shortness of breath.   Cardiovascular: Negative.  Negative for chest pain and leg swelling.  Gastrointestinal: Negative.  Negative for abdominal pain.  Genitourinary: Negative.  Negative for dysuria.  Musculoskeletal: Positive for joint pain.  Skin: Negative.  Negative for rash.  Neurological: Negative.  Negative for sensory change and weakness.  Psychiatric/Behavioral: Negative.  The patient is not nervous/anxious.     As per HPI. Otherwise, a complete review of systems is negative.  PAST MEDICAL HISTORY: Past Medical History:  Diagnosis Date  . Anxiety   . Benign breast lumps   . Blood clotting disorder ([place])   . Bone cancer ([place])   . Cervicalgia   . Chronic kidney disease    kidney stones  . Colon  cancer ([place])   . Complication of anesthesia    MOOD ALTERATION / UNSURE IF ANES OR PAIN MED. NO TROUBLE WITH MOH'S  . Cough    NASAL DRIP / SNEEZING / SORE THROAT MOSTLY CONSTANT  . Depression   . Diabetes ([place])   . Diverticulitis   . Dizzy   . GERD (gastroesophageal reflux disease)   . HBP (high blood pressure)   . Hematuria    gross  . Hemorrhoids   . HLD (hyperlipidemia)   . Muscle pain   . Osteoarthritis   . Osteoporosis   . Panic disorder   . PND (post-nasal drip)    CHRONIC WITH SORE THROAT AND SNEEZING  . Reflux   . Skin cancer   . Swelling   . Tremors of nervous system     PAST SURGICAL HISTORY: Past Surgical History:  Procedure Laterality Date  . APPENDECTOMY  1962  . BREAST BIOPSY Right 2006?   benign  . BREAST CYST EXCISION Left 07/27/2017   Procedure: SKIN CYST EXCISED;  Surgeon: Robert Bellow, MD;  Location: [facility] ORS;  Service: General;  Laterality: Left;  . BREAST SURGERY Right 2019  . CARPAL TUNNEL RELEASE Right   . CATARACT EXTRACTION W/PHACO Left 11/11/2016   Procedure: CATARACT EXTRACTION PHACO AND INTRAOCULAR LENS PLACEMENT (IOC);  Surgeon: Birder Robson, MD;  Location: [facility] ORS;  Service: Ophthalmology;  Laterality: Left;  [place] 01:07.9 AP% 19.2 CDE 13.02 Fluid pack lot # [id number]  . CATARACT EXTRACTION W/PHACO Right 12/09/2016   Procedure: CATARACT EXTRACTION PHACO AND INTRAOCULAR LENS PLACEMENT (IOC);  Surgeon: Birder Robson, MD;  Location: [facility]  ORS;  Service: Ophthalmology;  Laterality: Right;  [place] 00:49 AP% 21.2 CDE 10.39 Fluid pack lot # [id number]  . CHOLECYSTECTOMY    . COLONOSCOPY WITH PROPOFOL N/A 11/22/2014   Procedure: COLONOSCOPY WITH PROPOFOL;  Surgeon: Manya Silvas, MD;  Location[facility] ENDOSCOPY;  Service: Endoscopy;  Laterality: N/A;  . ESOPHAGOGASTRODUODENOSCOPY  11/22/2014   Procedure: ESOPHAGOGASTRODUODENOSCOPY (EGD);  Surgeon: Manya Silvas, MD;  Location: [facility] ENDOSCOPY;  Service: Endoscopy;;  .  ESOPHAGOGASTRODUODENOSCOPY (EGD) WITH PROPOFOL N/A 03/05/2017   Procedure: ESOPHAGOGASTRODUODENOSCOPY (EGD) WITH PROPOFOL;  Surgeon: Lin Landsman, MD;  Location: [facility] ENDOSCOPY;  Service: Gastroenterology;  Laterality: N/A;  . GALLBLADDER SURGERY    . hemi pelvectomy    . HEMIPELVIC Left   . LEG SURGERY Left    AMPUTATION  . MASTECTOMY, PARTIAL Left 07/27/2017   Procedure: MASTECTOMY PARTIAL;  Surgeon: Robert Bellow, MD;  Location: [facility] ORS;  Service: General;  Laterality: Left;  . OVARY SURGERY Right   . SAVORY DILATION  11/22/2014   Procedure: SAVORY DILATION;  Surgeon: Manya Silvas, MD;  Location: [facility] ENDOSCOPY;  Service: Endoscopy;;  . SENTINEL NODE BIOPSY Left 07/27/2017   Procedure: SENTINEL NODE BIOPSY;  Surgeon: Robert Bellow, MD;  Location: [facility] ORS;  Service: General;  Laterality: Left;  . SKIN CANCER EXCISION    . TONSILLECTOMY      FAMILY HISTORY: Family History  Problem Relation Age of Onset  . Breast cancer Paternal Aunt   . Ovarian cancer Sister   . Prostate cancer Father   . Stroke Mother   . Kidney cancer Neg Hx   . Bladder Cancer Neg Hx     ADVANCED DIRECTIVES (Y/N):  N  HEALTH MAINTENANCE: Social History   Tobacco Use  . Smoking status: Never Smoker  . Smokeless tobacco: Never Used  Substance Use Topics  . Alcohol use: No  . Drug use: No     Colonoscopy:  PAP:  Bone density:  Lipid panel:  Allergies  Allergen Reactions  . Ciprofloxacin Hives  . Codeine Other (See Comments)    HALLUCINATIONS  . Tape     Rash and skin irritation/ paper tape and tegaderm OK  . Ciprocinonide [Fluocinolone] Other (See Comments)    unknown  . Amoxicillin Other (See Comments)    Upset stomach Has patient had a PCN reaction causing immediate rash, facial/tongue/throat swelling, SOB or lightheadedness with hypotension: No Has patient had a PCN reaction causing severe rash involving mucus membranes or skin necrosis: No Has patient had a PCN  reaction that required hospitalization: No Has patient had a PCN reaction occurring within the last 10 years: Yes If all of the above answers are "NO", then may proceed with Cephalosporin use.;  . Cefuroxime Other (See Comments)    OK INTRACAMERALLY PER DR WLP, upset stomach  . Lipitor [Atorvastatin] Other (See Comments)    unknown    Current Outpatient Medications  Medication Sig Dispense Refill  . fenofibrate 160 MG tablet Take 160 mg by mouth daily.     Marland Kitchen GLIPIZIDE XL 2.5 MG 24 hr tablet Take 2.5 mg by mouth daily.     Marland Kitchen loratadine (CLARITIN) 10 MG tablet Take 10 mg by mouth daily.    Marland Kitchen losartan (COZAAR) 50 MG tablet Take 50 mg by mouth daily.     . metFORMIN (GLUCOPHAGE) 500 MG tablet Take 1,000 mg by mouth 2 (two) times daily.     . metoprolol succinate (TOPROL-XL) 25 MG 24 hr tablet Take 12.5  mg by mouth at bedtime.     Marland Kitchen omeprazole (PRILOSEC) 20 MG capsule Take 20 mg by mouth daily.    Marland Kitchen PARoxetine (PAXIL) 40 MG tablet Take 40 mg by mouth daily.     Vladimir Faster Glycol-Propyl Glycol (SYSTANE OP) Place 1 drop into the right eye 2 (two) times daily as needed (dry eyes).    . raloxifene (EVISTA) 60 MG tablet Take 60 mg by mouth daily.     . rivaroxaban (XARELTO) 20 MG TABS tablet Take 1 tablet (20 mg total) by mouth daily. 30 tablet 0  . acetaminophen (TYLENOL) 500 MG tablet Take 500 mg by mouth every 6 (six) hours as needed (for pain/headaches.).    Marland Kitchen letrozole (FEMARA) 2.5 MG tablet Take 1 tablet (2.5 mg total) by mouth daily. 30 tablet 3   No current facility-administered medications for this visit.     OBJECTIVE: Vitals:   09/17/17 1158  BP: (!) 126/56  Pulse: 70  Resp: 18  Temp: 97.8 F (36.6 C)     Body mass index is 30.99 kg/m.    ECOG FS:0 - Asymptomatic  General: Well-developed, well-nourished, no acute distress.  Sitting in a wheelchair. Eyes: Pink conjunctiva, anicteric sclera. Breast: Bilateral breast and axilla without lumps or masses.  Mild erythema left  breast. Lungs: Clear to auscultation bilaterally. Heart: Regular rate and rhythm. No rubs, murmurs, or gallops. Abdomen: Soft, nontender, nondistended. No organomegaly noted, normoactive bowel sounds. Musculoskeletal: No edema, cyanosis, or clubbing.  Left leg amputation. Neuro: Alert, answering all questions appropriately. Cranial nerves grossly intact. Skin: No rashes or petechiae noted. Psych: Normal affect.   LAB RESULTS:  Lab Results  Component Value Date   NA 139 07/01/2013   K 4.6 07/01/2013   CL 108 (H) 07/01/2013   CO2 23 07/01/2013   GLUCOSE 150 (H) 07/01/2013   BUN 23 01/20/2017   CREATININE 0.59 01/20/2017   CALCIUM 11.2 (H) 07/01/2013   PROT 7.4 07/01/2013   ALBUMIN 3.6 07/01/2013   AST 16 07/01/2013   ALT 19 07/01/2013   ALKPHOS 104 07/01/2013   BILITOT 0.2 07/01/2013   GFRNONAA 87 01/20/2017   GFRAA 100 01/20/2017    Lab Results  Component Value Date   WBC 12.1 (H) 07/01/2013   HGB 12.9 07/01/2013   HCT 39.6 07/01/2013   MCV 83 07/01/2013   PLT 228 07/01/2013     STUDIES: US Breast Complete Uni Left Inc Axilla  Result Date: 09/08/2017 Ultrasound examination prior to removal of the MammoSite balloon showed her to be spherical and in the location previously placed.  In light of the fact that the central channel is no longer intact for insertion of the iridium seed, a new balloon would be placed. Procedure was reviewed with the patient and she was amenable to proceed.  The area was cleansed with ChloraPrep and 10 cc of 0.5% Xylocaine with 0.25% Marcaine with 1-200,000 notes of epinephrine was utilized and well-tolerated.  The original balloon was deflated without incident. The area was recleansed with ChloraPrep and draped.  A new MammoSite balloon was placed and inflated with 60 cc of a mixture of saline and Omnipaque.  Prior to placement it was inflated to 70 cc and was shown to be spherical. Postplacement imaging showed the balloon to be at a minimal  distance of 1.22 cm from the skin and appeared to be perfectly spherical. Bacitracin ointment was applied to the skin exit site followed by dry dressing.  US Breast Complete Uni Left  Inc Axilla  Result Date: 09/02/2017 The procedure was reviewed and the patient was amenable to proceed.  She had made use of her Duricef as requested.  The skin was cleansed with alcohol followed by 10 cc of 0.5% Xylocaine with 0.25% Marcaine with 1 to 200,000 units of epinephrine.  ChloraPrep was applied to the skin x3.  An 11 blade was used to make a small incision lateral to the wide excision site.  The 8 mm trocar was advanced under ultrasound guidance with drainage of about 5-10 cc of odorless seroma fluid.  The cavity evaluation device was inflated and showed symmetrical expansion.  This was removed and the treatment balloon placed and inflated to 60 cc with a mixture of saline and Isovue contrast..  Minimal spacing to the skin 1.73 cm.  The exit site was treated with bacitracin ointment followed by dry dressing.  Patient tolerated the procedure well.    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  Oncotype DX score 1  PLAN:    1.  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  Patient had a lumpectomy on July 27, 2017.  She recently completed radiation with MammoSite treatment.  Patient was given a prescription for letrozole today which she will take daily for a total of 5 years completing in May 2024.  We will get a baseline bone mineral density in the next 1 to 2 weeks.  Return to clinic in 3 months for routine evaluation.    Approximately 30 minutes was spent in discussion of which greater than 50% was consultation.  Patient expressed understanding and was in  agreement with this plan. She also understands that She can call clinic at any time with any questions, concerns, or complaints.   Cancer Staging Primary cancer of upper outer quadrant of left female breast [facility]) Staging form: Breast, AJCC 8th Edition - Clinical stage from 06/12/2017: Stage IA (cT1b, cN0, cM0, G2, ER: Positive, PR: Positive, HER2: Negative) - Signed by Lloyd Huger, MD on 06/12/2017   Lloyd Huger, MD   09/19/2017 8:23 AM

## 2017-09-15 ENCOUNTER — Encounter: Payer: Self-pay | Admitting: General Surgery

## 2017-09-15 ENCOUNTER — Ambulatory Visit (INDEPENDENT_AMBULATORY_CARE_PROVIDER_SITE_OTHER): Payer: Medicare Other | Admitting: General Surgery

## 2017-09-15 ENCOUNTER — Ambulatory Visit: Payer: Medicare Other | Admitting: General Surgery

## 2017-09-15 VITALS — BP 122/60 | HR 75 | Resp 14 | Ht 61.0 in | Wt 163.0 lb

## 2017-09-15 DIAGNOSIS — C50412 Malignant neoplasm of upper-outer quadrant of left female breast: Secondary | ICD-10-CM

## 2017-09-15 NOTE — Patient Instructions (Signed)
Follow up here in 2 months.

## 2017-09-15 NOTE — Progress Notes (Signed)
Patient ID: Julie Acosta, female   DOB: [birth year], 84 y.o.   MRN: [id number]  Chief Complaint  Patient presents with  . Follow-up    mammosite     HPI Julie Acosta is a 84 y.o. female here following up from a left Mammosite placement done on 09/01/17. She completed her Radiation therapy on 09/10/17. She has one more dose left of antibiotics. She reports that she has had very bad post nasal drainage.  HPI  Past Medical History:  Diagnosis Date  . Anxiety   . Benign breast lumps   . Blood clotting disorder ([place])   . Bone cancer ([place])   . Cervicalgia   . Chronic kidney disease    kidney stones  . Colon cancer ([place])   . Complication of anesthesia    MOOD ALTERATION / UNSURE IF ANES OR PAIN MED. NO TROUBLE WITH MOH'S  . Cough    NASAL DRIP / SNEEZING / SORE THROAT MOSTLY CONSTANT  . Depression   . Diabetes ([place])   . Diverticulitis   . Dizzy   . GERD (gastroesophageal reflux disease)   . HBP (high blood pressure)   . Hematuria    gross  . Hemorrhoids   . HLD (hyperlipidemia)   . Muscle pain   . Osteoarthritis   . Osteoporosis   . Panic disorder   . PND (post-nasal drip)    CHRONIC WITH SORE THROAT AND SNEEZING  . Reflux   . Skin cancer   . Swelling   . Tremors of nervous system     Past Surgical History:  Procedure Laterality Date  . APPENDECTOMY  1962  . BREAST BIOPSY Right 2006?   benign  . BREAST CYST EXCISION Left 07/27/2017   Procedure: SKIN CYST EXCISED;  Surgeon: Robert Bellow, MD;  Location: [facility] ORS;  Service: General;  Laterality: Left;  . BREAST SURGERY Right 2019  . CARPAL TUNNEL RELEASE Right   . CATARACT EXTRACTION W/PHACO Left 11/11/2016   Procedure: CATARACT EXTRACTION PHACO AND INTRAOCULAR LENS PLACEMENT (IOC);  Surgeon: Birder Robson, MD;  Location: [facility] ORS;  Service: Ophthalmology;  Laterality: Left;  [place] 01:07.9 AP% 19.2 CDE 13.02 Fluid pack lot # [id number]  . CATARACT EXTRACTION W/PHACO Right 12/09/2016   Procedure: CATARACT  EXTRACTION PHACO AND INTRAOCULAR LENS PLACEMENT (IOC);  Surgeon: Birder Robson, MD;  Location: [facility] ORS;  Service: Ophthalmology;  Laterality: Right;  [place] 00:49 AP% 21.2 CDE 10.39 Fluid pack lot # [id number]  . CHOLECYSTECTOMY    . COLONOSCOPY WITH PROPOFOL N/A 11/22/2014   Procedure: COLONOSCOPY WITH PROPOFOL;  Surgeon: Manya Silvas, MD;  Location: [facility] ENDOSCOPY;  Service: Endoscopy;  Laterality: N/A;  . ESOPHAGOGASTRODUODENOSCOPY  11/22/2014   Procedure: ESOPHAGOGASTRODUODENOSCOPY (EGD);  Surgeon: Manya Silvas, MD;  Location: [facility] ENDOSCOPY;  Service: Endoscopy;;  . ESOPHAGOGASTRODUODENOSCOPY (EGD) WITH PROPOFOL N/A 03/05/2017   Procedure: ESOPHAGOGASTRODUODENOSCOPY (EGD) WITH PROPOFOL;  Surgeon: Lin Landsman, MD;  Location: [facility] ENDOSCOPY;  Service: Gastroenterology;  Laterality: N/A;  . GALLBLADDER SURGERY    . hemi pelvectomy    . HEMIPELVIC Left   . LEG SURGERY Left    AMPUTATION  . MASTECTOMY, PARTIAL Left 07/27/2017   Procedure: MASTECTOMY PARTIAL;  Surgeon: Robert Bellow, MD;  Location: [facility] ORS;  Service: General;  Laterality: Left;  . OVARY SURGERY Right   . SAVORY DILATION  11/22/2014   Procedure: SAVORY DILATION;  Surgeon: Manya Silvas, MD;  Location: [facility] ENDOSCOPY;  Service: Endoscopy;;  . SENTINEL NODE BIOPSY  Left 07/27/2017   Procedure: SENTINEL NODE BIOPSY;  Surgeon: Robert Bellow, MD;  Location: [facility] ORS;  Service: General;  Laterality: Left;  . SKIN CANCER EXCISION    . TONSILLECTOMY      Family History  Problem Relation Age of Onset  . Breast cancer Paternal Aunt   . Ovarian cancer Sister   . Prostate cancer Father   . Stroke Mother   . Kidney cancer Neg Hx   . Bladder Cancer Neg Hx     Social History Social History   Tobacco Use  . Smoking status: Never Smoker  . Smokeless tobacco: Never Used  Substance Use Topics  . Alcohol use: No  . Drug use: No    Allergies  Allergen Reactions  . Ciprofloxacin Hives  . Codeine Other  (See Comments)    HALLUCINATIONS  . Tape     Rash and skin irritation/ paper tape and tegaderm OK  . Ciprocinonide [Fluocinolone] Other (See Comments)    unknown  . Amoxicillin Other (See Comments)    Upset stomach Has patient had a PCN reaction causing immediate rash, facial/tongue/throat swelling, SOB or lightheadedness with hypotension: No Has patient had a PCN reaction causing severe rash involving mucus membranes or skin necrosis: No Has patient had a PCN reaction that required hospitalization: No Has patient had a PCN reaction occurring within the last 10 years: Yes If all of the above answers are "NO", then may proceed with Cephalosporin use.;  . Cefuroxime Other (See Comments)    OK INTRACAMERALLY PER DR WLP, upset stomach  . Lipitor [Atorvastatin] Other (See Comments)    unknown    Current Outpatient Medications  Medication Sig Dispense Refill  . acetaminophen (TYLENOL) 500 MG tablet Take 500 mg by mouth every 6 (six) hours as needed (for pain/headaches.).    Marland Kitchen fenofibrate 160 MG tablet Take 160 mg by mouth daily.     Marland Kitchen GLIPIZIDE XL 2.5 MG 24 hr tablet Take 2.5 mg by mouth daily.     Marland Kitchen losartan (COZAAR) 50 MG tablet Take 50 mg by mouth daily.     . metFORMIN (GLUCOPHAGE) 500 MG tablet Take 1,000 mg by mouth 2 (two) times daily.     . metoprolol succinate (TOPROL-XL) 25 MG 24 hr tablet Take 12.5 mg by mouth at bedtime.     Marland Kitchen PARoxetine (PAXIL) 40 MG tablet Take 40 mg by mouth daily.     Vladimir Faster Glycol-Propyl Glycol (SYSTANE OP) Place 1 drop into the right eye 2 (two) times daily as needed (dry eyes).    . raloxifene (EVISTA) 60 MG tablet Take 60 mg by mouth daily.     . rivaroxaban (XARELTO) 20 MG TABS tablet Take 1 tablet (20 mg total) by mouth daily. 30 tablet 0   No current facility-administered medications for this visit.     Review of Systems Review of Systems  Constitutional: Negative.   Respiratory: Negative.   Cardiovascular: Negative.     Blood pressure  122/60, pulse 75, resp. rate 14, height 5\' 1"  (1.549 m), weight 163 lb (73.9 kg).  Physical Exam Physical Exam  Constitutional: She is oriented to person, place, and time. She appears well-developed and well-nourished.  Pulmonary/Chest:  Mammosite area healing well. Steri strips in place.     Neurological: She is alert and oriented to person, place, and time.  Skin: Skin is warm and dry.  Psychiatric: She has a normal mood and affect.    Data Reviewed Oncotype DX recurrence score  was 1.  No benefit from adjuvant chemotherapy.  Assessment    Good tolerance of accelerated partial breast radiation.    Plan    The patient has a follow-up appointment with medical oncology on Sep 17, 2017.  Indications for the use of antiestrogen therapy was reviewed with the patient.  Follow up in 2 months    HPI, Physical Exam, Assessment and Plan have been scribed under the direction and in the presence of Robert Bellow, MD  Concepcion Living, LPN  I have completed the exam and reviewed the above documentation for accuracy and completeness.  I agree with the above.  Haematologist has been used and any errors in dictation or transcription are unintentional.  Hervey Ard, M.D., F.A.C.S.   Forest Gleason Nijee Heatwole 09/15/2017, 7:49 PM

## 2017-09-17 ENCOUNTER — Other Ambulatory Visit: Payer: Self-pay

## 2017-09-17 ENCOUNTER — Inpatient Hospital Stay: Payer: Medicare Other | Attending: Oncology | Admitting: Oncology

## 2017-09-17 VITALS — BP 126/56 | HR 70 | Temp 97.8°F | Resp 18 | Wt 164.0 lb

## 2017-09-17 DIAGNOSIS — Z8041 Family history of malignant neoplasm of ovary: Secondary | ICD-10-CM | POA: Insufficient documentation

## 2017-09-17 DIAGNOSIS — Z8042 Family history of malignant neoplasm of prostate: Secondary | ICD-10-CM | POA: Insufficient documentation

## 2017-09-17 DIAGNOSIS — E119 Type 2 diabetes mellitus without complications: Secondary | ICD-10-CM | POA: Diagnosis not present

## 2017-09-17 DIAGNOSIS — C50412 Malignant neoplasm of upper-outer quadrant of left female breast: Secondary | ICD-10-CM | POA: Insufficient documentation

## 2017-09-17 DIAGNOSIS — Z79899 Other long term (current) drug therapy: Secondary | ICD-10-CM | POA: Insufficient documentation

## 2017-09-17 DIAGNOSIS — F419 Anxiety disorder, unspecified: Secondary | ICD-10-CM | POA: Insufficient documentation

## 2017-09-17 DIAGNOSIS — K219 Gastro-esophageal reflux disease without esophagitis: Secondary | ICD-10-CM | POA: Diagnosis not present

## 2017-09-17 DIAGNOSIS — E785 Hyperlipidemia, unspecified: Secondary | ICD-10-CM | POA: Diagnosis not present

## 2017-09-17 DIAGNOSIS — Z87442 Personal history of urinary calculi: Secondary | ICD-10-CM | POA: Insufficient documentation

## 2017-09-17 DIAGNOSIS — F418 Other specified anxiety disorders: Secondary | ICD-10-CM | POA: Diagnosis not present

## 2017-09-17 DIAGNOSIS — M1990 Unspecified osteoarthritis, unspecified site: Secondary | ICD-10-CM | POA: Insufficient documentation

## 2017-09-17 DIAGNOSIS — Z170 Estrogen receptor positive status [ER+]: Secondary | ICD-10-CM | POA: Diagnosis not present

## 2017-09-17 DIAGNOSIS — Z7984 Long term (current) use of oral hypoglycemic drugs: Secondary | ICD-10-CM | POA: Insufficient documentation

## 2017-09-17 DIAGNOSIS — Z85038 Personal history of other malignant neoplasm of large intestine: Secondary | ICD-10-CM | POA: Diagnosis not present

## 2017-09-17 DIAGNOSIS — M542 Cervicalgia: Secondary | ICD-10-CM | POA: Insufficient documentation

## 2017-09-17 DIAGNOSIS — Z803 Family history of malignant neoplasm of breast: Secondary | ICD-10-CM | POA: Diagnosis not present

## 2017-09-17 DIAGNOSIS — Z9012 Acquired absence of left breast and nipple: Secondary | ICD-10-CM | POA: Insufficient documentation

## 2017-09-17 DIAGNOSIS — Z79811 Long term (current) use of aromatase inhibitors: Secondary | ICD-10-CM | POA: Diagnosis not present

## 2017-09-17 DIAGNOSIS — Z85828 Personal history of other malignant neoplasm of skin: Secondary | ICD-10-CM | POA: Insufficient documentation

## 2017-09-17 MED ORDER — LETROZOLE 2.5 MG PO TABS: 2.5000 mg | ORAL_TABLET | Freq: Every day | ORAL | 3 refills | Status: DC

## 2017-09-17 NOTE — Progress Notes (Signed)
Here for follow up. Per pt stated she has been feeling "fine "

## 2017-09-21 ENCOUNTER — Other Ambulatory Visit: Payer: Medicare Other

## 2017-09-23 ENCOUNTER — Ambulatory Visit
Admission: RE | Admit: 2017-09-23 | Discharge: 2017-09-23 | Disposition: A | Discharge status: Home / Self Care | Payer: Medicare Other | Source: Ambulatory Visit | Attending: Oncology | Admitting: Oncology

## 2017-09-23 DIAGNOSIS — M85851 Other specified disorders of bone density and structure, right thigh: Secondary | ICD-10-CM | POA: Insufficient documentation

## 2017-09-23 DIAGNOSIS — C50412 Malignant neoplasm of upper-outer quadrant of left female breast: Secondary | ICD-10-CM | POA: Diagnosis not present

## 2017-09-23 DIAGNOSIS — M8588 Other specified disorders of bone density and structure, other site: Secondary | ICD-10-CM | POA: Diagnosis not present

## 2017-09-23 DIAGNOSIS — Z79810 Long term (current) use of selective estrogen receptor modulators (SERMs): Secondary | ICD-10-CM | POA: Diagnosis not present

## 2017-09-23 DIAGNOSIS — Z79811 Long term (current) use of aromatase inhibitors: Secondary | ICD-10-CM | POA: Diagnosis not present

## 2017-09-23 DIAGNOSIS — Z853 Personal history of malignant neoplasm of breast: Secondary | ICD-10-CM | POA: Diagnosis not present

## 2017-09-23 DIAGNOSIS — Z780 Asymptomatic menopausal state: Secondary | ICD-10-CM | POA: Diagnosis not present

## 2017-10-02 ENCOUNTER — Encounter: Payer: Self-pay | Admitting: Emergency Medicine

## 2017-10-02 ENCOUNTER — Other Ambulatory Visit: Payer: Self-pay

## 2017-10-02 ENCOUNTER — Emergency Department
Admission: EM | Admit: 2017-10-02 | Discharge: 2017-10-02 | Disposition: A | Discharge status: Home / Self Care | Payer: Medicare Other | Attending: Student in an Organized Health Care Education/Training Program | Admitting: Student in an Organized Health Care Education/Training Program

## 2017-10-02 ENCOUNTER — Emergency Department: Payer: Medicare Other

## 2017-10-02 DIAGNOSIS — Z79899 Other long term (current) drug therapy: Secondary | ICD-10-CM | POA: Diagnosis not present

## 2017-10-02 DIAGNOSIS — Z85038 Personal history of other malignant neoplasm of large intestine: Secondary | ICD-10-CM | POA: Diagnosis not present

## 2017-10-02 DIAGNOSIS — I10 Essential (primary) hypertension: Secondary | ICD-10-CM | POA: Insufficient documentation

## 2017-10-02 DIAGNOSIS — R05 Cough: Secondary | ICD-10-CM | POA: Diagnosis present

## 2017-10-02 DIAGNOSIS — Z7901 Long term (current) use of anticoagulants: Secondary | ICD-10-CM | POA: Insufficient documentation

## 2017-10-02 DIAGNOSIS — E119 Type 2 diabetes mellitus without complications: Secondary | ICD-10-CM | POA: Insufficient documentation

## 2017-10-02 DIAGNOSIS — Z853 Personal history of malignant neoplasm of breast: Secondary | ICD-10-CM | POA: Insufficient documentation

## 2017-10-02 DIAGNOSIS — Z7984 Long term (current) use of oral hypoglycemic drugs: Secondary | ICD-10-CM | POA: Insufficient documentation

## 2017-10-02 DIAGNOSIS — Z85830 Personal history of malignant neoplasm of bone: Secondary | ICD-10-CM | POA: Diagnosis not present

## 2017-10-02 DIAGNOSIS — Z85828 Personal history of other malignant neoplasm of skin: Secondary | ICD-10-CM | POA: Insufficient documentation

## 2017-10-02 DIAGNOSIS — J40 Bronchitis, not specified as acute or chronic: Secondary | ICD-10-CM | POA: Diagnosis not present

## 2017-10-02 DIAGNOSIS — R059 Cough, unspecified: Secondary | ICD-10-CM

## 2017-10-02 LAB — COMPREHENSIVE METABOLIC PANEL
ALT: 12 U/L — ABNORMAL LOW (ref 14–54)
AST: 23 U/L (ref 15–41)
Albumin: 3.7 g/dL (ref 3.5–5.0)
Alkaline Phosphatase: 47 U/L (ref 38–126)
Anion gap: 10 (ref 5–15)
BUN: 23 mg/dL — ABNORMAL HIGH (ref 6–20)
CO2: 22 mmol/L (ref 22–32)
Calcium: 9.7 mg/dL (ref 8.9–10.3)
Chloride: 101 mmol/L (ref 101–111)
Creatinine, Ser: 0.79 mg/dL (ref 0.44–1.00)
GFR calc Af Amer: >60 mL/min (ref >60)
GFR calc non Af Amer: >60 mL/min (ref >60)
Glucose, Bld: 270 mg/dL — ABNORMAL HIGH (ref 65–99)
Potassium: 4.3 mmol/L (ref 3.5–5.1)
Sodium: 133 mmol/L — ABNORMAL LOW (ref 135–145)
Total Bilirubin: 0.8 mg/dL (ref 0.3–1.2)
Total Protein: 7 g/dL (ref 6.5–8.1)

## 2017-10-02 LAB — URINALYSIS, COMPLETE (UACMP) WITH MICROSCOPIC
Bacteria, UA: NONE SEEN
Bilirubin Urine: NEGATIVE
Glucose, UA: 50 mg/dL — AB
Ketones, ur: NEGATIVE mg/dL
NITRITE: NEGATIVE
Protein, ur: NEGATIVE mg/dL
SPECIFIC GRAVITY, URINE: 1.019 (ref 1.005–1.030)
pH: 5 (ref 5.0–8.0)

## 2017-10-02 LAB — CBC
HCT: 40.8 % (ref 35.0–47.0)
Hemoglobin: 13.6 g/dL (ref 12.0–16.0)
MCH: 30.5 pg (ref 26.0–34.0)
MCHC: 33.5 g/dL (ref 32.0–36.0)
MCV: 91.1 fL (ref 80.0–100.0)
Platelets: 150 10*3/uL (ref 150–440)
RBC: 4.48 MIL/uL (ref 3.80–5.20)
RDW: 15.5 % — ABNORMAL HIGH (ref 11.5–14.5)
WBC: 11.4 10*3/uL — ABNORMAL HIGH (ref 3.6–11.0)

## 2017-10-02 MED ORDER — METOPROLOL TARTRATE 25 MG PO TABS
12.5000 mg | ORAL_TABLET | Freq: Once | ORAL | Status: AC
  Administered 2017-10-02: 12.5 mg via ORAL

## 2017-10-02 MED ORDER — PREDNISONE 20 MG PO TABS: 40.0000 mg | ORAL_TABLET | Freq: Every day | ORAL | 0 refills | Status: AC

## 2017-10-02 MED ORDER — METOPROLOL TARTRATE 25 MG PO TABS
12.5000 mg | ORAL_TABLET | Freq: Once | ORAL | Status: DC
  Filled 2017-10-02: qty 1

## 2017-10-02 MED ORDER — IPRATROPIUM-ALBUTEROL 0.5-2.5 (3) MG/3ML IN SOLN
3.0000 mL | Freq: Once | RESPIRATORY_TRACT | Status: AC
  Administered 2017-10-02: 3 mL via RESPIRATORY_TRACT
  Filled 2017-10-02: qty 3

## 2017-10-02 MED ORDER — ALBUTEROL SULFATE HFA 108 (90 BASE) MCG/ACT IN AERS: 2.0000 | INHALATION_SPRAY | Freq: Four times a day (QID) | RESPIRATORY_TRACT | 2 refills | Status: DC | PRN

## 2017-10-02 MED ORDER — DOXYCYCLINE HYCLATE 100 MG PO TABS
100.0000 mg | ORAL_TABLET | Freq: Once | ORAL | Status: AC
  Administered 2017-10-02: 100 mg via ORAL
  Filled 2017-10-02: qty 1

## 2017-10-02 MED ORDER — DOXYCYCLINE HYCLATE 100 MG PO TABS: 100.0000 mg | ORAL_TABLET | Freq: Two times a day (BID) | ORAL | 0 refills | Status: AC

## 2017-10-02 MED ORDER — PREDNISONE 20 MG PO TABS
60.0000 mg | ORAL_TABLET | Freq: Once | ORAL | Status: AC
  Administered 2017-10-02: 60 mg via ORAL
  Filled 2017-10-02: qty 3

## 2017-10-02 NOTE — ED Triage Notes (Signed)
Cough for 2 days.  Nose running when she bends over.  Also urinating a lot --about every hour.  Has pneumonia shot on Monday.

## 2017-10-02 NOTE — ED Notes (Signed)
Pt provided meal tray and lemon lime shasta per request and MD order.

## 2017-10-02 NOTE — ED Notes (Signed)
Pt back in room from XR 

## 2017-10-02 NOTE — ED Notes (Signed)
Pt ambulatory to wheel chair upon discharge. Verbalized understanding of discharge instructions, follow-up care and prescriptions. Skin warm and dry. A&O x4.   Alvester Chou, [facility] transport, picked pt up to transport back home.

## 2017-10-02 NOTE — ED Notes (Addendum)
Patient transported to XR. 

## 2017-10-02 NOTE — ED Provider Notes (Signed)
[facility] Emergency Department Provider Note    First MD Initiated Contact with Patient 10/02/17 1458     (approximate)  I have reviewed the triage vital signs and the nursing notes.   HISTORY  Chief Complaint Cough    HPI Julie Acosta is a 84 y.o. female with history of DVT on Xarelto as well as a history of breast cancer presents to the ER with chief complaint of 3 days of cough worse at night is keeping her awake.  Denies any fevers or chills.  Denies any history of COPD.  States she is coughing up phlegm.  Denies any nausea or vomiting.  States she is feeling that she is going to urinate more frequently than normal.  Denies any chest pain.  Was recently started on Claritin and is worried this is her allergies acting up.    Past Medical History:  Diagnosis Date  . Anxiety   . Benign breast lumps   . Blood clotting disorder ([place])   . Bone cancer ([place])   . Cervicalgia   . Chronic kidney disease    kidney stones  . Colon cancer ([place])   . Complication of anesthesia    MOOD ALTERATION / UNSURE IF ANES OR PAIN MED. NO TROUBLE WITH MOH'S  . Cough    NASAL DRIP / SNEEZING / SORE THROAT MOSTLY CONSTANT  . Depression   . Diabetes ([place])   . Diverticulitis   . Dizzy   . GERD (gastroesophageal reflux disease)   . HBP (high blood pressure)   . Hematuria    gross  . Hemorrhoids   . HLD (hyperlipidemia)   . Muscle pain   . Osteoarthritis   . Osteoporosis   . Panic disorder   . PND (post-nasal drip)    CHRONIC WITH SORE THROAT AND SNEEZING  . Reflux   . Skin cancer   . Swelling   . Tremors of nervous system    Family History  Problem Relation Age of Onset  . Breast cancer Paternal Aunt   . Ovarian cancer Sister   . Prostate cancer Father   . Stroke Mother   . Kidney cancer Neg Hx   . Bladder Cancer Neg Hx    Past Surgical History:  Procedure Laterality Date  . APPENDECTOMY  1962  . BREAST BIOPSY Right 2006?   benign  . BREAST CYST  EXCISION Left 07/27/2017   Procedure: SKIN CYST EXCISED;  Surgeon: Robert Bellow, MD;  Location: [facility] ORS;  Service: General;  Laterality: Left;  . BREAST SURGERY Right 2019  . CARPAL TUNNEL RELEASE Right   . CATARACT EXTRACTION W/PHACO Left 11/11/2016   Procedure: CATARACT EXTRACTION PHACO AND INTRAOCULAR LENS PLACEMENT (IOC);  Surgeon: Birder Robson, MD;  Location: [facility] ORS;  Service: Ophthalmology;  Laterality: Left;  [place] 01:07.9 AP% 19.2 CDE 13.02 Fluid pack lot # [id number]  . CATARACT EXTRACTION W/PHACO Right 12/09/2016   Procedure: CATARACT EXTRACTION PHACO AND INTRAOCULAR LENS PLACEMENT (IOC);  Surgeon: Birder Robson, MD;  Location: [facility] ORS;  Service: Ophthalmology;  Laterality: Right;  [place] 00:49 AP% 21.2 CDE 10.39 Fluid pack lot # [id number]  . CHOLECYSTECTOMY    . COLONOSCOPY WITH PROPOFOL N/A 11/22/2014   Procedure: COLONOSCOPY WITH PROPOFOL;  Surgeon: Manya Silvas, MD;  Location: [facility] ENDOSCOPY;  Service: Endoscopy;  Laterality: N/A;  . ESOPHAGOGASTRODUODENOSCOPY  11/22/2014   Procedure: ESOPHAGOGASTRODUODENOSCOPY (EGD);  Surgeon: Manya Silvas, MD;  Location[facility] ENDOSCOPY;  Service: Endoscopy;;  . ESOPHAGOGASTRODUODENOSCOPY (  EGD) WITH PROPOFOL N/A 03/05/2017   Procedure: ESOPHAGOGASTRODUODENOSCOPY (EGD) WITH PROPOFOL;  Surgeon: Lin Landsman, MD;  Location: [facility] ENDOSCOPY;  Service: Gastroenterology;  Laterality: N/A;  . GALLBLADDER SURGERY    . hemi pelvectomy    . HEMIPELVIC Left   . LEG SURGERY Left    AMPUTATION  . MASTECTOMY, PARTIAL Left 07/27/2017   Procedure: MASTECTOMY PARTIAL;  Surgeon: Robert Bellow, MD;  Location: [facility] ORS;  Service: General;  Laterality: Left;  . OVARY SURGERY Right   . SAVORY DILATION  11/22/2014   Procedure: SAVORY DILATION;  Surgeon: Manya Silvas, MD;  Location: [facility] ENDOSCOPY;  Service: Endoscopy;;  . SENTINEL NODE BIOPSY Left 07/27/2017   Procedure: SENTINEL NODE BIOPSY;  Surgeon: Robert Bellow, MD;  Location:  [facility] ORS;  Service: General;  Laterality: Left;  . SKIN CANCER EXCISION    . TONSILLECTOMY     Patient Active Problem List   Diagnosis Date Noted  . Primary cancer of upper outer quadrant of left female breast ([place]) 06/12/2017  . Diarrhea 01/16/2017  . Hematuria 01/12/2017  . Chronic diarrhea 01/12/2017  . 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 Right leg swelling 10/13/2016  . Microscopic hematuria 06/02/2015  . Benign breast lumps 05/30/2015  . Cancer ([place]) 05/30/2015  . Depression 05/30/2015  . Diabetes mellitus type 2, uncomplicated ([place]) 16/02/9603  . Hyperlipidemia, unspecified 05/30/2015  . Hypertension 05/30/2015  . Osteoporosis, post-menopausal 05/30/2015  . Panic attacks 05/30/2015      Prior to Admission medications   Medication Sig Start Date End Date Taking? Authorizing Provider  acetaminophen (TYLENOL) 500 MG tablet Take 500 mg by mouth every 6 (six) hours as needed (for pain/headaches.).    [provider]  albuterol (PROVENTIL HFA;VENTOLIN HFA) 108 (90 Base) MCG/ACT inhaler Inhale 2 puffs into the lungs every 6 (six) hours as needed for wheezing or shortness of breath. 10/02/17   Merlyn Lot, MD  doxycycline (VIBRA-TABS) 100 MG tablet Take 1 tablet (100 mg total) by mouth 2 (two) times daily for 7 days. 10/02/17 10/09/17  Merlyn Lot, MD  fenofibrate 160 MG tablet Take 160 mg by mouth daily.  05/09/13   [provider]  GLIPIZIDE XL 2.5 MG 24 hr tablet Take 2.5 mg by mouth daily.  05/09/13   [provider]  letrozole (FEMARA) 2.5 MG tablet Take 1 tablet (2.5 mg total) by mouth daily. 09/17/17   Lloyd Huger, MD  loratadine (CLARITIN) 10 MG tablet Take 10 mg by mouth daily.    [provider]  losartan (COZAAR) 50 MG tablet Take 50 mg by mouth daily.  03/29/13   [provider]  metFORMIN (GLUCOPHAGE) 500 MG tablet Take 1,000 mg by mouth 2 (two) times daily.  03/15/13   [provider]  metoprolol  succinate (TOPROL-XL) 25 MG 24 hr tablet Take 12.5 mg by mouth at bedtime.  04/18/13   [provider]  omeprazole (PRILOSEC) 20 MG capsule Take 20 mg by mouth daily.    [provider]  PARoxetine (PAXIL) 40 MG tablet Take 40 mg by mouth daily.  02/28/13   [provider]  Polyethyl Glycol-Propyl Glycol (SYSTANE OP) Place 1 drop into the right eye 2 (two) times daily as needed (dry eyes).    [provider]  predniSONE (DELTASONE) 20 MG tablet Take 2 tablets (40 mg total) by mouth daily for 5 days. 10/02/17 10/07/17  Merlyn Lot, MD  raloxifene (EVISTA) 60 MG tablet Take 60 mg by mouth daily.  05/02/13   [provider]  rivaroxaban (XARELTO) 20 MG TABS tablet Take 1 tablet (20 mg total) by mouth daily. 07/30/17   Robert Bellow, MD    Allergies Ciprofloxacin; Codeine; Tape; Ciprocinonide [fluocinolone]; Amoxicillin; Cefuroxime; and Lipitor [atorvastatin]    Social History Social History   Tobacco Use  . Smoking status: Never Smoker  . Smokeless tobacco: Never Used  Substance Use Topics  . Alcohol use: No  . Drug use: No    Review of Systems Patient denies headaches, rhinorrhea, blurry vision, numbness, shortness of breath, chest pain, edema, cough, abdominal pain, nausea, vomiting, diarrhea, dysuria, fevers, rashes or hallucinations unless otherwise stated above in HPI. ____________________________________________   PHYSICAL EXAM:  VITAL SIGNS: Vitals:   10/02/17 1439 10/02/17 1600  BP: (!) 138/57 121/67  Pulse: (!) 105 (!) 103  Resp: 20 18  Temp:    SpO2: 93% 94%    Constitutional: Alert and oriented. Well appearing Eyes: Conjunctivae are normal.  Head: Atraumatic. Nose: No congestion/rhinnorhea. Mouth/Throat: Mucous membranes are moist.   Neck: No stridor. Painless ROM.  Cardiovascular: Normal rate, regular rhythm. Grossly normal heart sounds.  Good peripheral circulation. Respiratory: Normal respiratory effort.   No retractions. Lungs with coarse wheeze bilaterally. Gastrointestinal: Soft and nontender. No distention. No abdominal bruits. No CVA tenderness. Genitourinary: deferred Musculoskeletal: No lower extremity tenderness nor edema.  No joint effusions. Neurologic:  Normal speech and language. No gross focal neurologic deficits are appreciated. No facial droop Skin:  Skin is warm, dry and intact. No rash noted. Psychiatric: Mood and affect are normal. Speech and behavior are normal.  ____________________________________________   LABS (all labs ordered are listed, but only abnormal results are displayed)  Results for orders placed or performed during the hospital encounter of 10/02/17 (from the past 24 hour(s))  Urinalysis, Complete w Microscopic     Status: Abnormal   Collection Time: 10/02/17 11:57 AM  Result Value Ref Range   Color, Urine YELLOW (A) YELLOW   APPearance HAZY (A) CLEAR   Specific Gravity, Urine 1.019 1.005 - 1.030   pH 5.0 5.0 - 8.0   Glucose, UA 50 (A) NEGATIVE mg/dL   Hgb urine dipstick SMALL (A) NEGATIVE   Bilirubin Urine NEGATIVE NEGATIVE   Ketones, ur NEGATIVE NEGATIVE mg/dL   Protein, ur NEGATIVE NEGATIVE mg/dL   Nitrite NEGATIVE NEGATIVE   Leukocytes, UA SMALL (A) NEGATIVE   RBC / HPF 6-10 0 - 5 RBC/hpf   WBC, UA 21-50 0 - 5 WBC/hpf   Bacteria, UA NONE SEEN NONE SEEN   Squamous Epithelial / LPF 6-10 0 - 5   Mucus PRESENT   CBC     Status: Abnormal   Collection Time: 10/02/17 11:57 AM  Result Value Ref Range   WBC 11.4 (H) 3.6 - 11.0 K/uL   RBC 4.48 3.80 - 5.20 MIL/uL   Hemoglobin 13.6 12.0 - 16.0 g/dL   HCT 40.8 35.0 - 47.0 %   MCV 91.1 80.0 - 100.0 fL   MCH 30.5 26.0 - 34.0 pg   MCHC 33.5 32.0 - 36.0 g/dL   RDW 15.5 (H) 11.5 - 14.5 %   Platelets 150 150 - 440 K/uL  Comprehensive metabolic panel     Status: Abnormal   Collection Time: 10/02/17 11:57 AM  Result Value Ref Range   Sodium 133 (L) 135 - 145 mmol/L   Potassium 4.3 3.5 - 5.1 mmol/L    Chloride 101 101 - 111 mmol/L   CO2 22 22 - 32  mmol/L   Glucose, Bld 270 (H) 65 - 99 mg/dL   BUN 23 (H) 6 - 20 mg/dL   Creatinine, Ser 0.79 0.44 - 1.00 mg/dL   Calcium 9.7 8.9 - 10.3 mg/dL   Total Protein 7.0 6.5 - 8.1 g/dL   Albumin 3.7 3.5 - 5.0 g/dL   AST 23 15 - 41 U/L   ALT 12 (L) 14 - 54 U/L   Alkaline Phosphatase 47 38 - 126 U/L   Total Bilirubin 0.8 0.3 - 1.2 mg/dL   GFR calc non Af Amer >60 >60 mL/min   GFR calc Af Amer >60 >60 mL/min   Anion gap 10 5 - 15   ____________________________________________  EKG My review and personal interpretation at Time: 15:45   Indication: cough  Rate: 105  Rhythm: sinus Axis: left Other: normal intervals, no stemi, no change in morphology as compared to previous ____________________________________________  RADIOLOGY  I personally reviewed all radiographic images ordered to evaluate for the above acute complaints and reviewed radiology reports and findings.  These findings were personally discussed with the patient.  Please see medical record for radiology report.  ____________________________________________   PROCEDURES  Procedure(s) performed:  Procedures    Critical Care performed: no ____________________________________________   INITIAL IMPRESSION / ASSESSMENT AND PLAN / ED COURSE  Pertinent labs & imaging results that were available during my care of the patient were reviewed by me and considered in my medical decision making (see chart for details).   DDX: Asthma, copd, CHF, pna, ptx, malignancy, Pe, anemia   CRISTEN BREDESON is a 84 y.o. who presents to the ED with symptoms and cough as described above.  Patient well-appearing and is afebrile right now.  Borderline tachycardia but patient states that she has not had anything to eat today and is a diabetic.  Blood work is reassuring.  Chest x-ray shows no consolidation.  Based on her wheeze and bronchitic breath sounds will give nebulizer treatment as well as  steroids.  We will also treat with doxycycline.  Patient is on Xarelto therefore not clinically consistent with pulmonary embolism and the patient has no hypoxia.  Patient has improvement in symptoms after nebulizer treatment will be stable appropriate for trial of outpatient follow-up.  Discussed signs and symptoms which she should return immediately to the hospital.      As part of my medical decision making, I reviewed the following data within the [place] notes reviewed and incorporated, Labs reviewed, notes from prior ED visits and [place] Controlled Substance Database   ____________________________________________   FINAL CLINICAL IMPRESSION(S) / ED DIAGNOSES  Final diagnoses:  Cough  Bronchitis      NEW MEDICATIONS STARTED DURING THIS VISIT:  New Prescriptions   ALBUTEROL (PROVENTIL HFA;VENTOLIN HFA) 108 (90 BASE) MCG/ACT INHALER    Inhale 2 puffs into the lungs every 6 (six) hours as needed for wheezing or shortness of breath.   DOXYCYCLINE (VIBRA-TABS) 100 MG TABLET    Take 1 tablet (100 mg total) by mouth 2 (two) times daily for 7 days.   PREDNISONE (DELTASONE) 20 MG TABLET    Take 2 tablets (40 mg total) by mouth daily for 5 days.     Note:  This document was prepared using Dragon voice recognition software and may include unintentional dictation errors.    Merlyn Lot, MD 10/02/17 [phone]

## 2017-10-12 ENCOUNTER — Ambulatory Visit: Payer: Medicare Other | Admitting: Podiatry

## 2017-10-12 ENCOUNTER — Encounter (INDEPENDENT_AMBULATORY_CARE_PROVIDER_SITE_OTHER): Payer: Self-pay | Admitting: Vascular Surgery

## 2017-10-12 ENCOUNTER — Ambulatory Visit (INDEPENDENT_AMBULATORY_CARE_PROVIDER_SITE_OTHER): Payer: Medicare Other | Admitting: Vascular Surgery

## 2017-10-12 VITALS — BP 109/64 | HR 81 | Resp 16 | Ht 60.0 in | Wt 164.0 lb

## 2017-10-12 DIAGNOSIS — I87009 Postthrombotic syndrome without complications of unspecified extremity: Secondary | ICD-10-CM

## 2017-10-12 DIAGNOSIS — E119 Type 2 diabetes mellitus without complications: Secondary | ICD-10-CM | POA: Diagnosis not present

## 2017-10-12 DIAGNOSIS — I10 Essential (primary) hypertension: Secondary | ICD-10-CM

## 2017-10-12 DIAGNOSIS — E785 Hyperlipidemia, unspecified: Secondary | ICD-10-CM

## 2017-10-12 DIAGNOSIS — M7989 Other specified soft tissue disorders: Secondary | ICD-10-CM | POA: Diagnosis not present

## 2017-10-12 NOTE — Progress Notes (Signed)
MRN : [id number]  Julie Acosta is a 84 y.o. ([birth year]) female who presents with chief complaint of  Chief Complaint  Patient presents with  . Follow-up    45yr no studies  .  History of Present Illness:   The patient presents to the office for evaluation of DVT.  DVT was identified at [facility] remotely.  The initial symptoms were pain and swelling in the lower extremity.  The patient notes the leg continues to be painful with dependency and swells a bite.  Symptoms are much better with elevation.  The patient notes minimal edema in the morning which steadily worsens throughout the day.    She is not having any hematuria at this time.   The patient has been using compression therapy at this point.  No SOB or pleuritic chest pains.  No cough or hemoptysis.  No blood per rectum but she is still having 4+ loose stools per day.  No blood in any sputum.  No excessive bruising per the patient.  previous ABI's Rt=1.04 and the Lt=AKA   Current Meds  Medication Sig  . acetaminophen (TYLENOL) 500 MG tablet Take 500 mg by mouth every 6 (six) hours as needed (for pain/headaches.).  Marland Kitchen albuterol (PROVENTIL HFA;VENTOLIN HFA) 108 (90 Base) MCG/ACT inhaler Inhale 2 puffs into the lungs every 6 (six) hours as needed for wheezing or shortness of breath.  . fenofibrate 160 MG tablet Take 160 mg by mouth daily.   Marland Kitchen GLIPIZIDE XL 2.5 MG 24 hr tablet Take 2.5 mg by mouth daily.   Marland Kitchen letrozole (FEMARA) 2.5 MG tablet Take 1 tablet (2.5 mg total) by mouth daily.  Marland Kitchen loratadine (CLARITIN) 10 MG tablet Take 10 mg by mouth daily.  Marland Kitchen losartan (COZAAR) 50 MG tablet Take 50 mg by mouth daily.   . metFORMIN (GLUCOPHAGE) 500 MG tablet Take 1,000 mg by mouth 2 (two) times daily.   . metoprolol succinate (TOPROL-XL) 25 MG 24 hr tablet Take 12.5 mg by mouth at bedtime.   Marland Kitchen omeprazole (PRILOSEC) 20 MG capsule Take 20 mg by mouth daily.  Marland Kitchen PARoxetine (PAXIL) 40 MG tablet Take 40 mg by mouth daily.   Vladimir Faster Glycol-Propyl Glycol (SYSTANE OP) Place 1 drop into the right eye 2 (two) times daily as needed (dry eyes).  . raloxifene (EVISTA) 60 MG tablet Take 60 mg by mouth daily.   . rivaroxaban (XARELTO) 20 MG TABS tablet Take 1 tablet (20 mg total) by mouth daily.    Past Medical History:  Diagnosis Date  . Anxiety   . Benign breast lumps   . Blood clotting disorder ([place])   . Bone cancer ([place])   . Cervicalgia   . Chronic kidney disease    kidney stones  . Colon cancer ([place])   . Complication of anesthesia    MOOD ALTERATION / UNSURE IF ANES OR PAIN MED. NO TROUBLE WITH MOH'S  . Cough    NASAL DRIP / SNEEZING / SORE THROAT MOSTLY CONSTANT  . Depression   . Diabetes ([place])   . Diverticulitis   . Dizzy   . GERD (gastroesophageal reflux disease)   . HBP (high blood pressure)   . Hematuria    gross  . Hemorrhoids   . HLD (hyperlipidemia)   . Muscle pain   . Osteoarthritis   . Osteoporosis   . Panic disorder   . PND (post-nasal drip)    CHRONIC WITH SORE THROAT AND SNEEZING  . Reflux   .  Skin cancer   . Swelling   . Tremors of nervous system     Past Surgical History:  Procedure Laterality Date  . APPENDECTOMY  1962  . BREAST BIOPSY Right 2006?   benign  . BREAST CYST EXCISION Left 07/27/2017   Procedure: SKIN CYST EXCISED;  Surgeon: Robert Bellow, MD;  Location: [facility] ORS;  Service: General;  Laterality: Left;  . BREAST SURGERY Right 2019  . CARPAL TUNNEL RELEASE Right   . CATARACT EXTRACTION W/PHACO Left 11/11/2016   Procedure: CATARACT EXTRACTION PHACO AND INTRAOCULAR LENS PLACEMENT (IOC);  Surgeon: Birder Robson, MD;  Location: [facility] ORS;  Service: Ophthalmology;  Laterality: Left;  [place] 01:07.9 AP% 19.2 CDE 13.02 Fluid pack lot # [id number]  . CATARACT EXTRACTION W/PHACO Right 12/09/2016   Procedure: CATARACT EXTRACTION PHACO AND INTRAOCULAR LENS PLACEMENT (IOC);  Surgeon: Birder Robson, MD;  Location: [facility] ORS;  Service: Ophthalmology;  Laterality: Right;   [place] 00:49 AP% 21.2 CDE 10.39 Fluid pack lot # [id number]  . CHOLECYSTECTOMY    . COLONOSCOPY WITH PROPOFOL N/A 11/22/2014   Procedure: COLONOSCOPY WITH PROPOFOL;  Surgeon: Manya Silvas, MD;  Location[facility] ENDOSCOPY;  Service: Endoscopy;  Laterality: N/A;  . ESOPHAGOGASTRODUODENOSCOPY  11/22/2014   Procedure: ESOPHAGOGASTRODUODENOSCOPY (EGD);  Surgeon: Manya Silvas, MD;  Location: [facility] ENDOSCOPY;  Service: Endoscopy;;  . ESOPHAGOGASTRODUODENOSCOPY (EGD) WITH PROPOFOL N/A 03/05/2017   Procedure: ESOPHAGOGASTRODUODENOSCOPY (EGD) WITH PROPOFOL;  Surgeon: Lin Landsman, MD;  Location: [facility] ENDOSCOPY;  Service: Gastroenterology;  Laterality: N/A;  . GALLBLADDER SURGERY    . hemi pelvectomy    . HEMIPELVIC Left   . LEG SURGERY Left    AMPUTATION  . MASTECTOMY, PARTIAL Left 07/27/2017   Procedure: MASTECTOMY PARTIAL;  Surgeon: Robert Bellow, MD;  Location: [facility] ORS;  Service: General;  Laterality: Left;  . OVARY SURGERY Right   . SAVORY DILATION  11/22/2014   Procedure: SAVORY DILATION;  Surgeon: Manya Silvas, MD;  Location: [facility] ENDOSCOPY;  Service: Endoscopy;;  . SENTINEL NODE BIOPSY Left 07/27/2017   Procedure: SENTINEL NODE BIOPSY;  Surgeon: Robert Bellow, MD;  Location: [facility] ORS;  Service: General;  Laterality: Left;  . SKIN CANCER EXCISION    . TONSILLECTOMY      Social History Social History   Tobacco Use  . Smoking status: Never Smoker  . Smokeless tobacco: Never Used  Substance Use Topics  . Alcohol use: No  . Drug use: No    Family History Family History  Problem Relation Age of Onset  . Breast cancer Paternal Aunt   . Ovarian cancer Sister   . Prostate cancer Father   . Stroke Mother   . Kidney cancer Neg Hx   . Bladder Cancer Neg Hx     Allergies  Allergen Reactions  . Ciprofloxacin Hives  . Codeine Other (See Comments)    HALLUCINATIONS  . Tape     Rash and skin irritation/ paper tape and tegaderm OK  . Ciprocinonide [Fluocinolone]  Other (See Comments)    unknown  . Amoxicillin Other (See Comments)    Upset stomach Has patient had a PCN reaction causing immediate rash, facial/tongue/throat swelling, SOB or lightheadedness with hypotension: No Has patient had a PCN reaction causing severe rash involving mucus membranes or skin necrosis: No Has patient had a PCN reaction that required hospitalization: No Has patient had a PCN reaction occurring within the last 10 years: Yes If all of the above answers are "NO", then may proceed with Cephalosporin  use.;  . Cefuroxime Other (See Comments)    OK INTRACAMERALLY PER DR WLP, upset stomach  . Lipitor [Atorvastatin] Other (See Comments)    unknown     REVIEW OF SYSTEMS (Negative unless checked)  Constitutional: [] Weight loss  [] Fever  [] Chills Cardiac: [] Chest pain   [] Chest pressure   [] Palpitations   [] Shortness of breath when laying flat   [] Shortness of breath with exertion. Vascular:  [] Pain in legs with walking   [x] Pain in legs at rest  [x] History of DVT   [] Phlebitis   [x] Swelling in legs   [] Varicose veins   [] Non-healing ulcers Pulmonary:   [] Uses home oxygen   [] Productive cough   [] Hemoptysis   [] Wheeze  [] COPD   [] Asthma Neurologic:  [] Dizziness   [] Seizures   [] History of stroke   [] History of TIA  [] Aphasia   [] Vissual changes   [] Weakness or numbness in arm   [] Weakness or numbness in leg Musculoskeletal:   [] Joint swelling   [x] Joint pain   [] Low back pain Hematologic:  [] Easy bruising  [] Easy bleeding   [] Hypercoagulable state   [] Anemic Gastrointestinal:  [] Diarrhea   [] Vomiting  [] Gastroesophageal reflux/heartburn   [] Difficulty swallowing. Genitourinary:  [] Chronic kidney disease   [] Difficult urination  [] Frequent urination   [] Blood in urine Skin:  [x] Rashes   [] Ulcers  Psychological:  [] History of anxiety   []  History of major depression.  Physical Examination  Vitals:   10/12/17 1136  BP: 109/64  Pulse: 81  Resp: 16  Weight: 164 lb (74.4 kg)    Height: 5' (1.524 m)   Body mass index is 32.03 kg/m. Gen: WD/WN, NAD Head: [place]/AT, No temporalis wasting.  Ear/Nose/Throat: Hearing grossly intact, nares w/o erythema or drainage Eyes: PER, EOMI, sclera nonicteric.  Neck: Supple, no large masses.   Pulmonary:  Good air movement, no audible wheezing bilaterally, no use of accessory muscles.  Cardiac: RRR, no JVD Vascular: Large varicosities present extensively greater than 5 mm bilaterally.  severe venous stasis changes to the legs bilaterally.  3+ soft pitting edema Vessel Right Left  Radial Palpable Palpable  PT Palpable Palpable  DP Palpable Palpable  Gastrointestinal: Non-distended. No guarding/no peritoneal signs.  Musculoskeletal: M/S 5/5 throughout.  No deformity or atrophy.  Neurologic: CN 2-12 intact. Symmetrical.  Speech is fluent. Motor exam as listed above. Psychiatric: Judgment intact, Mood & affect appropriate for pt's clinical situation. Dermatologic: venous rashes no ulcers noted.  No changes consistent with cellulitis. Lymph : No lichenification or skin changes of chronic lymphedema.  CBC Lab Results  Component Value Date   WBC 11.4 (H) 10/02/2017   HGB 13.6 10/02/2017   HCT 40.8 10/02/2017   MCV 91.1 10/02/2017   PLT 150 10/02/2017    BMET    Component Value Date/Time   NA 133 (L) 10/02/2017 1157   NA 139 07/01/2013 1721   K 4.3 10/02/2017 1157   K 4.6 07/01/2013 1721   CL 101 10/02/2017 1157   CL 108 (H) 07/01/2013 1721   CO2 22 10/02/2017 1157   CO2 23 07/01/2013 1721   GLUCOSE 270 (H) 10/02/2017 1157   GLUCOSE 150 (H) 07/01/2013 1721   BUN 23 (H) 10/02/2017 1157   BUN 23 01/20/2017 1134   BUN 21 (H) 07/01/2013 1721   CREATININE 0.79 10/02/2017 1157   CREATININE 0.95 07/01/2013 1721   CALCIUM 9.7 10/02/2017 1157   CALCIUM 11.2 (H) 07/01/2013 1721   GFRNONAA >60 10/02/2017 1157   GFRNONAA 58 (L) 07/01/2013 1721  GFRAA >60 10/02/2017 1157   GFRAA >60 07/01/2013 1721   Estimated Creatinine  Clearance: 49.7 mL/min (by C-G formula based on SCr of 0.79 mg/dL).  COAG No results found for: INR, PROTIME  Radiology Dg Chest 2 View  Result Date: 10/02/2017 CLINICAL DATA:  Nonproductive cough.  Wheezing. EXAM: CHEST - 2 VIEW COMPARISON:  CT 07/01/2013. FINDINGS: Mediastinum and hilar structures normal. Heart size normal. Mild bibasilar atelectasis. No pleural effusion or pneumothorax. Degenerative change thoracic spine with prominent scoliosis. IMPRESSION: One mild bibasilar atelectasis and/or scarring. 2. Prominent thoracic spine scoliosis. Diffuse degenerative change. Electronically Signed   By: Marcello Moores  Register   On: 10/02/2017 15:07   Dg Bone Density  Result Date: 09/23/2017 EXAM: DUAL X-RAY ABSORPTIOMETRY (DXA) FOR BONE MINERAL DENSITY IMPRESSION: Dear Dr. Delight Hoh, Your patient Zayonna Ayuso completed a BMD test on 09/23/2017 using the [place] (analysis version: 14.10) manufactured by [employer]. The following summarizes the results of our evaluation. PATIENT BIOGRAPHICAL: Name: Kenda, Kloehn Patient ID: [id number] Birth Date: 1934-03-25 Height: 61.0 in. Gender: Female Exam Date: 09/23/2017 Weight: 164.0 lbs. Indications: Advanced Age, Caucasian, History of Breast Cancer, Parent Hip Fracture, Postmenopausal Fractures: Treatments: Evista, Femara ASSESSMENT: The BMD measured at Femur Total is 0.701 g/cm2 with a T-score of -2.4. This patient is considered osteopenic according to [facility]) criteria. Patient is not a candidate for FRAX due to Evista. Left femur was excluded due to amputation and hemipelvectomy. Site Region Measured Measured WHO Young Adult BMD Date       Age      Classification T-score AP Spine L1-L4 09/23/2017 [age] Osteopenia -1.9 0.955 g/cm2 Right Femur Total 09/23/2017 81.4 Osteopenia -2.4 0.701 g/cm2 World Health Organization [facility]) criteria for post-menopausal, Caucasian Women: Normal:       T-score at or above -1 SD Osteopenia:    T-score between -1 and -2.5 SD Osteoporosis: T-score at or below -2.5 SD RECOMMENDATIONS: 1. All patients should optimize calcium and vitamin D intake. 2. Consider FDA-approved medical therapies in postmenopausal women and men aged 45 years and older, based on the following: a. A hip or vertebral(clinical or morphometric) fracture b. T-score < -2.5 at the femoral neck or spine after appropriate evaluation to exclude secondary causes c. Low bone mass (T-score between -1.0 and -2.5 at the femoral neck or spine) and a 10-year probability of a hip fracture > 3% or a 10-year probability of a major osteoporosis-related fracture > 20% based on the [place]-adapted WHO algorithm d. Clinician judgment and/or patient preferences may indicate treatment for people with 10-year fracture probabilities above or below these levels FOLLOW-UP: People with diagnosed cases of osteoporosis or at high risk for fracture should have regular bone mineral density tests. For patients eligible for Medicare, routine testing is allowed once every 2 years. The testing frequency can be increased to one year for patients who have rapidly progressing disease, those who are receiving or discontinuing medical therapy to restore bone mass, or have additional risk factors. I have reviewed this report, and agree with the above findings. [facility] Radiology Electronically Signed   By: Lowella Grip III M.D.   On: 09/23/2017 10:47      Assessment/Plan 1. Post-phlebitic syndrome Recommend:   No surgery or intervention at this point in time.  IVC filter is not indicated at present.  Patient's duplex ultrasound of the venous system shows DVT from the popliteal to the femoral veins.  The patient is initiated on anticoagulation   Elevation was  stressed, use of a recliner was discussed.  I have had a long discussion with the patient regarding DVT and post phlebitic changes such as swelling and why it  causes symptoms such as pain.  The  patient will wear graduated compression stockings class 1 (20-30 mmHg), beginning after three full days of anticoagulation, on a daily basis a prescription was given. The patient will  beginning wearing the stockings first thing in the morning and removing them in the evening. The patient is instructed specifically not to sleep in the stockings.  In addition, behavioral modification including elevation during the day and avoidance of prolonged dependency will be initiated.    The patient will continue anticoagulation for now as there have not been any problems or complications at this point.    2. Right leg swelling See #1  3. Hyperlipidemia, unspecified hyperlipidemia type Continue statin as ordered and reviewed, no changes at this time   4. Type 2 diabetes mellitus without complication, unspecified whether long term insulin use (HCC) Continue hypoglycemic medications as already ordered, these medications have been reviewed and there are no changes at this time.  Hgb A1C to be monitored as already arranged by primary service   5. Essential hypertension Continue antihypertensive medications as already ordered, these medications have been reviewed and there are no changes at this time.     Hortencia Pilar, MD  10/12/2017 11:46 AM

## 2017-10-15 ENCOUNTER — Other Ambulatory Visit: Payer: Self-pay

## 2017-10-15 ENCOUNTER — Ambulatory Visit
Admission: RE | Admit: 2017-10-15 | Discharge: 2017-10-15 | Disposition: A | Discharge status: Home / Self Care | Payer: Medicare Other | Source: Ambulatory Visit | Attending: Radiation Oncology | Admitting: Radiation Oncology

## 2017-10-15 ENCOUNTER — Encounter: Payer: Self-pay | Admitting: Radiation Oncology

## 2017-10-15 VITALS — BP 127/63 | HR 85 | Temp 98.2°F

## 2017-10-15 DIAGNOSIS — C50412 Malignant neoplasm of upper-outer quadrant of left female breast: Secondary | ICD-10-CM | POA: Insufficient documentation

## 2017-10-15 DIAGNOSIS — Z923 Personal history of irradiation: Secondary | ICD-10-CM | POA: Diagnosis not present

## 2017-10-15 DIAGNOSIS — Z170 Estrogen receptor positive status [ER+]: Secondary | ICD-10-CM | POA: Insufficient documentation

## 2017-10-15 DIAGNOSIS — Z79811 Long term (current) use of aromatase inhibitors: Secondary | ICD-10-CM | POA: Diagnosis not present

## 2017-10-15 NOTE — Progress Notes (Signed)
Radiation Oncology Follow up Note  Name: Julie Acosta   Date:   10/15/2017 MRN:  [id number] DOB: 08/21/1933    This 83 y.o. female presents to the clinic today for one-month follow-up status post accelerated partial breast irradiation to her left breast for ER/PR positive invasive mammary carcinoma.  REFERRING PROVIDER: Hedrick, James, MD  HPI: patient is a 83-year-old female.status post hemipelvectomy in her 20s for malignant bone tumor. 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 She is recently got over some bronchitis and is doing well she specifically denies breast tenderness cough or bone pain. She has been started on Femara tolerate that well without side effect.  COMPLICATIONS OF TREATMENT: none  FOLLOW UP COMPLIANCE: keeps appointments   PHYSICAL EXAM:  BP 127/63   Pulse 85   Temp 98.2 F (36.8 C)  Lungs are clear to A&P cardiac examination essentially unremarkable with regular rate and rhythm. No dominant mass or nodularity is noted in either breast in 2 positions examined. Incision is well-healed. No axillary or supraclavicular adenopathy is appreciated. Cosmetic result is excellent. Well-developed well-nourished patient in NAD. HEENT reveals PERLA, EOMI, discs not visualized.  Oral cavity is clear. No oral mucosal lesions are identified. Neck is clear without evidence of cervical or supraclavicular adenopathy. Lungs are clear to A&P. Cardiac examination is essentially unremarkable with regular rate and rhythm without murmur rub or thrill. Abdomen is benign with no organomegaly or masses noted. He does have amputation of her left lower extremity.. No peripheral adenopathy or edema is identified. No motor or sensory levels are noted. Crude visual fields are within normal range.  RADIOLOGY RESULTS: no current films for review  PLAN: present time patient is doing well  with no evidence of disease. I'm please were overall progress. She's recovered nicely from her accelerated partial breast radiation. She is currently on Femara tolerate that well without side effect. I have asked to see her back in 4-5 months for follow-up. Patient is to call with any concerns.  I would like to take this opportunity to thank you for allowing me to participate in the care of your patient..     , MD   

## 2017-10-19 ENCOUNTER — Ambulatory Visit (INDEPENDENT_AMBULATORY_CARE_PROVIDER_SITE_OTHER): Payer: Medicare Other | Admitting: Podiatry

## 2017-10-19 ENCOUNTER — Encounter: Payer: Self-pay | Admitting: Podiatry

## 2017-10-19 DIAGNOSIS — M79676 Pain in unspecified toe(s): Secondary | ICD-10-CM | POA: Diagnosis not present

## 2017-10-19 DIAGNOSIS — B351 Tinea unguium: Secondary | ICD-10-CM

## 2017-10-19 DIAGNOSIS — E119 Type 2 diabetes mellitus without complications: Secondary | ICD-10-CM

## 2017-10-19 DIAGNOSIS — D689 Coagulation defect, unspecified: Secondary | ICD-10-CM

## 2017-10-19 NOTE — Progress Notes (Addendum)
This patient presents the office for continued preventative foot care services on her right foot. She hasn't history of an amputation of the left leg due to bone cancer at 26.  Patient is a type II diabetic.  Patient is presently taking Xarelto  and Paxil.  Patient states that the nails have grown thick and long and are painful walking and wearing her shoes.  She presents the office today for preventative foot care services   General Appearance  Alert, conversant and in no acute stress.  Vascular  Dorsalis pedis and posterior tibial  pulses are palpable  right..  Capillary return is within normal limits right foot.. Temperature is within normal limits  Right foot.  Neurologic  Senn-Weinstein monofilament wire test within normal limits  . Muscle power within normal limits .  Nails Thick disfigured discolored nails with subungual debris  from hallux to fifth toes right. No evidence of bacterial infection or drainage   Orthopedic  No limitations of motion of motion feet .  No crepitus or effusions noted.  No bony pathology or digital deformities noted. Amputation left leg.  Skin  normotropic skin with no porokeratosis noted right.  No signs of infections or ulcers noted.    Onychomycosis  Amputation left leg.  Diabetes.  \   Debridement and grinding of nails right foot.  ABN signed for 2019.   Luisdaniel Kenton DPM 

## 2017-11-24 ENCOUNTER — Ambulatory Visit: Payer: Medicare Other | Admitting: General Surgery

## 2017-12-15 ENCOUNTER — Ambulatory Visit (INDEPENDENT_AMBULATORY_CARE_PROVIDER_SITE_OTHER): Payer: Medicare Other | Admitting: General Surgery

## 2017-12-15 ENCOUNTER — Encounter: Payer: Self-pay | Admitting: General Surgery

## 2017-12-15 VITALS — BP 112/64 | HR 85 | Resp 18 | Ht 60.0 in | Wt 164.0 lb

## 2017-12-15 DIAGNOSIS — C50412 Malignant neoplasm of upper-outer quadrant of left female breast: Secondary | ICD-10-CM | POA: Diagnosis not present

## 2017-12-15 DIAGNOSIS — Z170 Estrogen receptor positive status [ER+]: Secondary | ICD-10-CM

## 2017-12-15 NOTE — Patient Instructions (Addendum)
The patient is aware to call back for any questions or new concerns. The patient has been asked to return to the office in 5 months with a bilateral diagnostic mammogram.

## 2017-12-15 NOTE — Progress Notes (Signed)
Patient ID: Julie Acosta, female   DOB: 1933-05-08, 84 y.o.   MRN: [id number]  Chief Complaint  Patient presents with  . Follow-up    HPI Julie Acosta is a 84 y.o. female.  Here for follow up left breast cancer. No new complaints. She did fall out of her lift chair on Sunday but no injures. Tolerating the letrozole. Denies pain only complains that the treated breast is larger than the nontreated breast.. HPI  Past Medical History:  Diagnosis Date  . Anxiety   . Benign breast lumps   . Blood clotting disorder ([place])   . Bone cancer ([place])   . Cervicalgia   . Chronic kidney disease    kidney stones  . Colon cancer ([place])   . Complication of anesthesia    MOOD ALTERATION / UNSURE IF ANES OR PAIN MED. NO TROUBLE WITH MOH'S  . Cough    NASAL DRIP / SNEEZING / SORE THROAT MOSTLY CONSTANT  . Depression   . Diabetes ([place])   . Diverticulitis   . Dizzy   . GERD (gastroesophageal reflux disease)   . HBP (high blood pressure)   . Hematuria    gross  . Hemorrhoids   . HLD (hyperlipidemia)   . Muscle pain   . Osteoarthritis   . Osteoporosis   . Panic disorder   . PND (post-nasal drip)    CHRONIC WITH SORE THROAT AND SNEEZING  . Reflux   . Skin cancer   . Swelling   . Tremors of nervous system     Past Surgical History:  Procedure Laterality Date  . APPENDECTOMY  1962  . BREAST BIOPSY Right 2006?   benign  . BREAST CYST EXCISION Left 07/27/2017   Procedure: SKIN CYST EXCISED;  Surgeon: Robert Bellow, MD;  Location: [facility] ORS;  Service: General;  Laterality: Left;  . BREAST SURGERY Right 2019  . CARPAL TUNNEL RELEASE Right   . CATARACT EXTRACTION W/PHACO Left 11/11/2016   Procedure: CATARACT EXTRACTION PHACO AND INTRAOCULAR LENS PLACEMENT (IOC);  Surgeon: Birder Robson, MD;  Location: [facility] ORS;  Service: Ophthalmology;  Laterality: Left;  [place] 01:07.9 AP% 19.2 CDE 13.02 Fluid pack lot # [id number]  . CATARACT EXTRACTION W/PHACO Right 12/09/2016   Procedure: CATARACT  EXTRACTION PHACO AND INTRAOCULAR LENS PLACEMENT (IOC);  Surgeon: Birder Robson, MD;  Location: [facility] ORS;  Service: Ophthalmology;  Laterality: Right;  [place] 00:49 AP% 21.2 CDE 10.39 Fluid pack lot # [id number]  . CHOLECYSTECTOMY    . COLONOSCOPY WITH PROPOFOL N/A 11/22/2014   Procedure: COLONOSCOPY WITH PROPOFOL;  Surgeon: Manya Silvas, MD;  Location: [facility] ENDOSCOPY;  Service: Endoscopy;  Laterality: N/A;  . ESOPHAGOGASTRODUODENOSCOPY  11/22/2014   Procedure: ESOPHAGOGASTRODUODENOSCOPY (EGD);  Surgeon: Manya Silvas, MD;  Location: [facility] ENDOSCOPY;  Service: Endoscopy;;  . ESOPHAGOGASTRODUODENOSCOPY (EGD) WITH PROPOFOL N/A 03/05/2017   Procedure: ESOPHAGOGASTRODUODENOSCOPY (EGD) WITH PROPOFOL;  Surgeon: Lin Landsman, MD;  Location: [facility] ENDOSCOPY;  Service: Gastroenterology;  Laterality: N/A;  . GALLBLADDER SURGERY    . hemi pelvectomy    . HEMIPELVIC Left   . LEG SURGERY Left    AMPUTATION  . MASTECTOMY, PARTIAL Left 07/27/2017   Procedure: MASTECTOMY PARTIAL;  Surgeon: Robert Bellow, MD;  Location: [facility] ORS;  Service: General;  Laterality: Left;  . OVARY SURGERY Right   . SAVORY DILATION  11/22/2014   Procedure: SAVORY DILATION;  Surgeon: Manya Silvas, MD;  Location: [facility] ENDOSCOPY;  Service: Endoscopy;;  . SENTINEL NODE BIOPSY Left 07/27/2017  Procedure: SENTINEL NODE BIOPSY;  Surgeon: Robert Bellow, MD;  Location: [facility] ORS;  Service: General;  Laterality: Left;  . SKIN CANCER EXCISION    . TONSILLECTOMY      Family History  Problem Relation Age of Onset  . Breast cancer Paternal Aunt   . Ovarian cancer Sister   . Prostate cancer Father   . Stroke Mother   . Kidney cancer Neg Hx   . Bladder Cancer Neg Hx     Social History Social History   Tobacco Use  . Smoking status: Never Smoker  . Smokeless tobacco: Never Used  Substance Use Topics  . Alcohol use: No  . Drug use: No    Allergies  Allergen Reactions  . Ciprofloxacin Hives  . Codeine Other  (See Comments)    HALLUCINATIONS  . Tape     Rash and skin irritation/ paper tape and tegaderm OK  . Ciprocinonide [Fluocinolone] Other (See Comments)    unknown  . Amoxicillin Other (See Comments)    Upset stomach Has patient had a PCN reaction causing immediate rash, facial/tongue/throat swelling, SOB or lightheadedness with hypotension: No Has patient had a PCN reaction causing severe rash involving mucus membranes or skin necrosis: No Has patient had a PCN reaction that required hospitalization: No Has patient had a PCN reaction occurring within the last 10 years: Yes If all of the above answers are "NO", then may proceed with Cephalosporin use.;  . Cefuroxime Other (See Comments)    OK INTRACAMERALLY PER DR WLP, upset stomach  . Lipitor [Atorvastatin] Other (See Comments)    unknown    Current Outpatient Medications  Medication Sig Dispense Refill  . acetaminophen (TYLENOL) 500 MG tablet Take 500 mg by mouth every 6 (six) hours as needed (for pain/headaches.).    Marland Kitchen albuterol (PROVENTIL HFA;VENTOLIN HFA) 108 (90 Base) MCG/ACT inhaler Inhale 2 puffs into the lungs every 6 (six) hours as needed for wheezing or shortness of breath. 1 Inhaler 2  . doxycycline (VIBRA-TABS) 100 MG tablet     . fenofibrate 160 MG tablet Take 160 mg by mouth daily.     Marland Kitchen GLIPIZIDE XL 2.5 MG 24 hr tablet Take 2.5 mg by mouth daily.     Marland Kitchen letrozole (FEMARA) 2.5 MG tablet Take 1 tablet (2.5 mg total) by mouth daily. 30 tablet 3  . loratadine (CLARITIN) 10 MG tablet Take 10 mg by mouth daily.    Marland Kitchen losartan (COZAAR) 50 MG tablet Take 50 mg by mouth daily.     . metFORMIN (GLUCOPHAGE) 500 MG tablet Take 1,000 mg by mouth 2 (two) times daily.     . metoprolol succinate (TOPROL-XL) 25 MG 24 hr tablet Take 12.5 mg by mouth at bedtime.     Marland Kitchen omeprazole (PRILOSEC) 20 MG capsule Take 20 mg by mouth daily.    Marland Kitchen PARoxetine (PAXIL) 40 MG tablet Take 40 mg by mouth daily.     Vladimir Faster Glycol-Propyl Glycol (SYSTANE  OP) Place 1 drop into the right eye 2 (two) times daily as needed (dry eyes).    . predniSONE (DELTASONE) 20 MG tablet Take 20 mg by mouth daily with breakfast.    . raloxifene (EVISTA) 60 MG tablet Take 60 mg by mouth daily.     . rivaroxaban (XARELTO) 20 MG TABS tablet Take 1 tablet (20 mg total) by mouth daily. 30 tablet 0   No current facility-administered medications for this visit.     Review of Systems Review of Systems  Constitutional: Negative.   Respiratory: Negative.   Cardiovascular: Negative.     Blood pressure 112/64, pulse 85, resp. rate 18, height 5' (1.524 m), weight 164 lb (74.4 kg).  Physical Exam Physical Exam  Constitutional: She is oriented to person, place, and time. She appears well-developed and well-nourished.  Pulmonary/Chest:  left lumpectomy site well healed.    Neurological: She is alert and oriented to person, place, and time.  Skin: Skin is warm and dry.  Psychiatric: Her behavior is normal.       Assessment    Doing well status post breast conservation.    Plan    The patient has been asked to return to the office in 5 months with a bilateral diagnostic mammogram.       HPI, Physical Exam, Assessment and Plan have been scribed under the direction and in the presence of Robert Bellow, MD. Karie Fetch, RN  I have completed the exam and reviewed the above documentation for accuracy and completeness.  I agree with the above.  Haematologist has been used and any errors in dictation or transcription are unintentional.  Hervey Ard, M.D., F.A.C.S.  Karie Fetch M 12/15/2017, 12:03 PM

## 2017-12-22 NOTE — Progress Notes (Signed)
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  Oncotype DX score 1.  INTERVAL HISTORY: Patient returns to clinic today for further evaluation and to assess her toleration of letrozole.  She currently feels well and is asymptomatic. She has no neurologic complaints.  She denies any recent fevers or illnesses.  She has a good appetite and denies weight loss.  She has no chest pain or shortness of breath.  She denies any nausea, vomiting, constipation, or diarrhea.  She has no urinary complaints.  Patient feels at her baseline offers no specific complaints today.  REVIEW OF SYSTEMS:   Review of Systems  Constitutional: Negative.  Negative for fever, malaise/fatigue and weight loss.  Respiratory: Negative.  Negative for cough and shortness of breath.   Cardiovascular: Negative.  Negative for chest pain and leg swelling.  Gastrointestinal: Negative.  Negative for abdominal pain.  Genitourinary: Negative.  Negative for dysuria.  Musculoskeletal: Positive for joint pain.  Skin: Negative.  Negative for rash.  Neurological: Negative.  Negative for sensory change and weakness.  Psychiatric/Behavioral: Negative.  The patient is not nervous/anxious.     As per HPI. Otherwise, a complete review of systems is negative.  PAST MEDICAL HISTORY: Past Medical History:  Diagnosis Date  . Anxiety   . Benign breast lumps   . Blood clotting disorder ([place])   . Bone cancer ([place])   . Cervicalgia   . Chronic kidney disease    kidney stones  . Colon cancer ([place])   . Complication of anesthesia    MOOD ALTERATION / UNSURE IF ANES OR PAIN MED. NO TROUBLE WITH  MOH'S  . Cough    NASAL DRIP / SNEEZING / SORE THROAT MOSTLY CONSTANT  . Depression   . Diabetes ([place])   . Diverticulitis   . Dizzy   . GERD (gastroesophageal reflux disease)   . HBP (high blood pressure)   . Hematuria    gross  . Hemorrhoids   . HLD (hyperlipidemia)   . Muscle pain   . Osteoarthritis   . Osteoporosis   . Panic disorder   . PND (post-nasal drip)    CHRONIC WITH SORE THROAT AND SNEEZING  . Reflux   . Skin cancer   . Swelling   . Tremors of nervous system     PAST SURGICAL HISTORY: Past Surgical History:  Procedure Laterality Date  . APPENDECTOMY  1962  . BREAST BIOPSY Right 2006?   benign  . BREAST CYST EXCISION Left 07/27/2017   Procedure: SKIN CYST EXCISED;  Surgeon: Robert Bellow, MD;  Location: [facility] ORS;  Service: General;  Laterality: Left;  . BREAST SURGERY Right 2019  . CARPAL TUNNEL RELEASE Right   . CATARACT EXTRACTION W/PHACO Left 11/11/2016   Procedure: CATARACT EXTRACTION PHACO AND INTRAOCULAR LENS PLACEMENT (IOC);  Surgeon: Birder Robson, MD;  Location: [facility] ORS;  Service: Ophthalmology;  Laterality: Left;  [place] 01:07.9 AP% 19.2 CDE 13.02 Fluid pack lot # [id number]  . CATARACT EXTRACTION W/PHACO Right 12/09/2016   Procedure: CATARACT EXTRACTION PHACO AND INTRAOCULAR LENS PLACEMENT (IOC);  Surgeon: Birder Robson, MD;  Location: [facility] ORS;  Service: Ophthalmology;  Laterality: Right;  [place] 00:49 AP% 21.2 CDE 10.39 Fluid pack  lot # [id number]  . CHOLECYSTECTOMY    . COLONOSCOPY WITH PROPOFOL N/A 11/22/2014   Procedure: COLONOSCOPY WITH PROPOFOL;  Surgeon: Manya Silvas, MD;  Location: [facility] ENDOSCOPY;  Service: Endoscopy;  Laterality: N/A;  . ESOPHAGOGASTRODUODENOSCOPY  11/22/2014   Procedure: ESOPHAGOGASTRODUODENOSCOPY (EGD);  Surgeon: Manya Silvas, MD;  Location: [facility] ENDOSCOPY;  Service: Endoscopy;;  . ESOPHAGOGASTRODUODENOSCOPY (EGD) WITH PROPOFOL N/A 03/05/2017   Procedure: ESOPHAGOGASTRODUODENOSCOPY (EGD) WITH PROPOFOL;  Surgeon:  Lin Landsman, MD;  Location: [facility] ENDOSCOPY;  Service: Gastroenterology;  Laterality: N/A;  . GALLBLADDER SURGERY    . hemi pelvectomy    . HEMIPELVIC Left   . LEG SURGERY Left    AMPUTATION  . MASTECTOMY, PARTIAL Left 07/27/2017   Procedure: MASTECTOMY PARTIAL;  Surgeon: Robert Bellow, MD;  Location: [facility] ORS;  Service: General;  Laterality: Left;  . OVARY SURGERY Right   . SAVORY DILATION  11/22/2014   Procedure: SAVORY DILATION;  Surgeon: Manya Silvas, MD;  Location: [facility] ENDOSCOPY;  Service: Endoscopy;;  . SENTINEL NODE BIOPSY Left 07/27/2017   Procedure: SENTINEL NODE BIOPSY;  Surgeon: Robert Bellow, MD;  Location: [facility] ORS;  Service: General;  Laterality: Left;  . SKIN CANCER EXCISION    . TONSILLECTOMY      FAMILY HISTORY: Family History  Problem Relation Age of Onset  . Breast cancer Paternal Aunt   . Ovarian cancer Sister   . Prostate cancer Father   . Stroke Mother   . Kidney cancer Neg Hx   . Bladder Cancer Neg Hx     ADVANCED DIRECTIVES (Y/N):  N  HEALTH MAINTENANCE: Social History   Tobacco Use  . Smoking status: Never Smoker  . Smokeless tobacco: Never Used  Substance Use Topics  . Alcohol use: No  . Drug use: No     Colonoscopy:  PAP:  Bone density:  Lipid panel:  Allergies  Allergen Reactions  . Ciprofloxacin Hives  . Codeine Other (See Comments)    HALLUCINATIONS  . Tape     Rash and skin irritation/ paper tape and tegaderm OK  . Ciprocinonide [Fluocinolone] Other (See Comments)    unknown  . Amoxicillin Other (See Comments)    Upset stomach Has patient had a PCN reaction causing immediate rash, facial/tongue/throat swelling, SOB or lightheadedness with hypotension: No Has patient had a PCN reaction causing severe rash involving mucus membranes or skin necrosis: No Has patient had a PCN reaction that required hospitalization: No Has patient had a PCN reaction occurring within the last 10 years: Yes If all of the above  answers are "NO", then may proceed with Cephalosporin use.;  . Cefuroxime Other (See Comments)    OK INTRACAMERALLY PER DR WLP, upset stomach  . Lipitor [Atorvastatin] Other (See Comments)    unknown    Current Outpatient Medications  Medication Sig Dispense Refill  . fenofibrate 160 MG tablet Take 160 mg by mouth daily.     Marland Kitchen GLIPIZIDE XL 2.5 MG 24 hr tablet Take 2.5 mg by mouth daily.     Marland Kitchen letrozole (FEMARA) 2.5 MG tablet Take 1 tablet (2.5 mg total) by mouth daily. 30 tablet 3  . loratadine (CLARITIN) 10 MG tablet Take 10 mg by mouth daily.    Marland Kitchen losartan (COZAAR) 50 MG tablet Take 50 mg by mouth daily.     . metFORMIN (GLUCOPHAGE) 500 MG tablet Take 1,000 mg by mouth 2 (two) times daily.     . metoprolol succinate (TOPROL-XL) 25 MG 24 hr  tablet Take 12.5 mg by mouth at bedtime.     Marland Kitchen omeprazole (PRILOSEC) 20 MG capsule Take 20 mg by mouth daily.    Marland Kitchen PARoxetine (PAXIL) 40 MG tablet Take 40 mg by mouth daily.     Vladimir Faster Glycol-Propyl Glycol (SYSTANE OP) Place 1 drop into the right eye 2 (two) times daily as needed (dry eyes).    . raloxifene (EVISTA) 60 MG tablet Take 60 mg by mouth daily.     . rivaroxaban (XARELTO) 20 MG TABS tablet Take 1 tablet (20 mg total) by mouth daily. 30 tablet 0  . acetaminophen (TYLENOL) 500 MG tablet Take 500 mg by mouth every 6 (six) hours as needed (for pain/headaches.).    Marland Kitchen albuterol (PROVENTIL HFA;VENTOLIN HFA) 108 (90 Base) MCG/ACT inhaler Inhale 2 puffs into the lungs every 6 (six) hours as needed for wheezing or shortness of breath. (Patient not taking: Reported on 12/23/2017) 1 Inhaler 2  . alendronate (FOSAMAX) 70 MG tablet Take 1 tablet (70 mg total) by mouth once a week. Take with a full glass of water on an empty stomach. 4 tablet 12  . Blood Glucose Monitoring Suppl (FIFTY50 GLUCOSE METER 2.0) w/Device KIT Use as directed    . predniSONE (DELTASONE) 20 MG tablet Take 20 mg by mouth daily with breakfast.     No current facility-administered  medications for this visit.     OBJECTIVE: Vitals:   12/23/17 1126  BP: (!) 110/57  Pulse: 75  Resp: 18  Temp: (!) 97.3 F (36.3 C)     Body mass index is 29.69 kg/m.    ECOG FS:0 - Asymptomatic  General: Well-developed, well-nourished, no acute distress. Eyes: Pink conjunctiva, anicteric sclera. HEENT: Normocephalic, moist mucous membranes. Breast: Exam deferred today. Lungs: Clear to auscultation bilaterally. Heart: Regular rate and rhythm. No rubs, murmurs, or gallops. Abdomen: Soft, nontender, nondistended. No organomegaly noted, normoactive bowel sounds. Musculoskeletal: No edema, cyanosis, or clubbing. Neuro: Alert, answering all questions appropriately. Cranial nerves grossly intact. Skin: No rashes or petechiae noted. Psych: Normal affect.  LAB RESULTS:  Lab Results  Component Value Date   NA 133 (L) 10/02/2017   K 4.3 10/02/2017   CL 101 10/02/2017   CO2 22 10/02/2017   GLUCOSE 270 (H) 10/02/2017   BUN 23 (H) 10/02/2017   CREATININE 0.79 10/02/2017   CALCIUM 9.7 10/02/2017   PROT 7.0 10/02/2017   ALBUMIN 3.7 10/02/2017   AST 23 10/02/2017   ALT 12 (L) 10/02/2017   ALKPHOS 47 10/02/2017   BILITOT 0.8 10/02/2017   GFRNONAA >60 10/02/2017   GFRAA >60 10/02/2017    Lab Results  Component Value Date   WBC 11.4 (H) 10/02/2017   HGB 13.6 10/02/2017   HCT 40.8 10/02/2017   MCV 91.1 10/02/2017   PLT 150 10/02/2017     STUDIES: No results found.  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  Oncotype DX score 1  PLAN:    1.  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  Patient had a lumpectomy on July 27, 2017.  She completed radiation with MammoSite treatment.  Continue letrozole for a total of 5 years  completing treatment in May 2024.  Patient has requested less frequent follow-up, therefore she will return to clinic in 6 months for  routine evaluation. 2.  Osteopenia: Patient had a bone marrow density on Sep 23, 2017 reported T score of -2.4.  She was initiated on Fosamax and instructed to take calcium and vitamin D supplementation.   Patient expressed understanding and was in agreement with this plan. She also understands that She can call clinic at any time with any questions, concerns, or complaints.   Cancer Staging Primary cancer of upper outer quadrant of left female breast [facility]) Staging form: Breast, AJCC 8th Edition - Clinical stage from 06/12/2017: Stage IA (cT1b, cN0, cM0, G2, ER: Positive, PR: Positive, HER2: Negative) - Signed by Lloyd Huger, MD on 06/12/2017   Lloyd Huger, MD   12/25/2017 7:24 PM

## 2017-12-23 ENCOUNTER — Other Ambulatory Visit: Payer: Self-pay

## 2017-12-23 ENCOUNTER — Inpatient Hospital Stay: Payer: Medicare Other | Attending: Oncology | Admitting: Oncology

## 2017-12-23 VITALS — BP 110/57 | HR 75 | Temp 97.3°F | Resp 18 | Wt 152.0 lb

## 2017-12-23 DIAGNOSIS — F418 Other specified anxiety disorders: Secondary | ICD-10-CM | POA: Diagnosis not present

## 2017-12-23 DIAGNOSIS — Z9012 Acquired absence of left breast and nipple: Secondary | ICD-10-CM | POA: Insufficient documentation

## 2017-12-23 DIAGNOSIS — Z8719 Personal history of other diseases of the digestive system: Secondary | ICD-10-CM | POA: Diagnosis not present

## 2017-12-23 DIAGNOSIS — Z85038 Personal history of other malignant neoplasm of large intestine: Secondary | ICD-10-CM | POA: Insufficient documentation

## 2017-12-23 DIAGNOSIS — E785 Hyperlipidemia, unspecified: Secondary | ICD-10-CM | POA: Diagnosis not present

## 2017-12-23 DIAGNOSIS — I129 Hypertensive chronic kidney disease with stage 1 through stage 4 chronic kidney disease, or unspecified chronic kidney disease: Secondary | ICD-10-CM | POA: Diagnosis not present

## 2017-12-23 DIAGNOSIS — D689 Coagulation defect, unspecified: Secondary | ICD-10-CM | POA: Insufficient documentation

## 2017-12-23 DIAGNOSIS — N189 Chronic kidney disease, unspecified: Secondary | ICD-10-CM | POA: Insufficient documentation

## 2017-12-23 DIAGNOSIS — K219 Gastro-esophageal reflux disease without esophagitis: Secondary | ICD-10-CM | POA: Diagnosis not present

## 2017-12-23 DIAGNOSIS — Z7984 Long term (current) use of oral hypoglycemic drugs: Secondary | ICD-10-CM | POA: Diagnosis not present

## 2017-12-23 DIAGNOSIS — C50412 Malignant neoplasm of upper-outer quadrant of left female breast: Secondary | ICD-10-CM | POA: Insufficient documentation

## 2017-12-23 DIAGNOSIS — Z803 Family history of malignant neoplasm of breast: Secondary | ICD-10-CM | POA: Insufficient documentation

## 2017-12-23 DIAGNOSIS — M542 Cervicalgia: Secondary | ICD-10-CM | POA: Insufficient documentation

## 2017-12-23 DIAGNOSIS — Z79899 Other long term (current) drug therapy: Secondary | ICD-10-CM | POA: Diagnosis not present

## 2017-12-23 DIAGNOSIS — Z85828 Personal history of other malignant neoplasm of skin: Secondary | ICD-10-CM | POA: Insufficient documentation

## 2017-12-23 DIAGNOSIS — M1990 Unspecified osteoarthritis, unspecified site: Secondary | ICD-10-CM | POA: Insufficient documentation

## 2017-12-23 DIAGNOSIS — Z8041 Family history of malignant neoplasm of ovary: Secondary | ICD-10-CM | POA: Diagnosis not present

## 2017-12-23 DIAGNOSIS — Z7901 Long term (current) use of anticoagulants: Secondary | ICD-10-CM | POA: Diagnosis not present

## 2017-12-23 DIAGNOSIS — E119 Type 2 diabetes mellitus without complications: Secondary | ICD-10-CM | POA: Insufficient documentation

## 2017-12-23 DIAGNOSIS — Z170 Estrogen receptor positive status [ER+]: Secondary | ICD-10-CM | POA: Diagnosis not present

## 2017-12-23 DIAGNOSIS — Z79811 Long term (current) use of aromatase inhibitors: Secondary | ICD-10-CM | POA: Diagnosis not present

## 2017-12-23 DIAGNOSIS — M810 Age-related osteoporosis without current pathological fracture: Secondary | ICD-10-CM | POA: Diagnosis not present

## 2017-12-23 MED ORDER — ALENDRONATE SODIUM 70 MG PO TABS: 70.0000 mg | ORAL_TABLET | ORAL | 12 refills | Status: DC

## 2017-12-23 NOTE — Progress Notes (Signed)
Here for follow up. Pt stated overall she feels" fine" no c/o at this time

## 2018-01-11 ENCOUNTER — Ambulatory Visit (INDEPENDENT_AMBULATORY_CARE_PROVIDER_SITE_OTHER): Payer: Medicare Other | Admitting: Podiatry

## 2018-01-11 ENCOUNTER — Encounter: Payer: Self-pay | Admitting: Podiatry

## 2018-01-11 DIAGNOSIS — D689 Coagulation defect, unspecified: Secondary | ICD-10-CM | POA: Diagnosis not present

## 2018-01-11 DIAGNOSIS — M79676 Pain in unspecified toe(s): Secondary | ICD-10-CM

## 2018-01-11 DIAGNOSIS — E119 Type 2 diabetes mellitus without complications: Secondary | ICD-10-CM | POA: Diagnosis not present

## 2018-01-11 DIAGNOSIS — B351 Tinea unguium: Secondary | ICD-10-CM

## 2018-01-11 NOTE — Progress Notes (Signed)
This patient presents the office for continued preventative foot care services on her right foot. She hasn't history of an amputation of the left leg due to bone cancer at 26.  Patient is a type II diabetic.  Patient is presently taking Xarelto  and Paxil.  Patient states that the nails have grown thick and long and are painful walking and wearing her shoes.  She presents the office today for preventative foot care services   General Appearance  Alert, conversant and in no acute stress.  Vascular  Dorsalis pedis and posterior tibial  pulses are palpable  right..  Capillary return is within normal limits right foot.. Temperature is within normal limits  Right foot.  Neurologic  Senn-Weinstein monofilament wire test within normal limits  . Muscle power within normal limits .  Nails Thick disfigured discolored nails with subungual debris  from hallux to fifth toes right. No evidence of bacterial infection or drainage   Orthopedic  No limitations of motion of motion feet .  No crepitus or effusions noted.  No bony pathology or digital deformities noted. Amputation left leg.  Skin  normotropic skin with no porokeratosis noted right.  No signs of infections or ulcers noted.    Onychomycosis  Amputation left leg.  Diabetes.  \   Debridement and grinding of nails right foot.  ABN signed for 2019.   Gardiner Barefoot DPM

## 2018-01-13 ENCOUNTER — Other Ambulatory Visit: Payer: Self-pay | Admitting: Oncology

## 2018-01-18 ENCOUNTER — Ambulatory Visit: Payer: Medicare Other | Admitting: Podiatry

## 2018-02-01 ENCOUNTER — Telehealth: Payer: Self-pay | Admitting: Urology

## 2018-02-01 NOTE — Telephone Encounter (Signed)
This patient has a 1 year follow up app on the 17th with a KUB prior Can you put an order in please and thank you?   Sharyn Lull

## 2018-02-01 NOTE — Addendum Note (Signed)
Addended by: Billey Co on: 02/01/2018 01:15 PM   Modules accepted: Orders

## 2018-02-18 ENCOUNTER — Ambulatory Visit (INDEPENDENT_AMBULATORY_CARE_PROVIDER_SITE_OTHER): Payer: Medicare Other | Admitting: Urology

## 2018-02-18 ENCOUNTER — Ambulatory Visit
Admission: RE | Admit: 2018-02-18 | Discharge: 2018-02-18 | Disposition: A | Discharge status: Home / Self Care | Payer: Medicare Other | Source: Ambulatory Visit | Attending: Urology | Admitting: Urology

## 2018-02-18 ENCOUNTER — Ambulatory Visit: Payer: Medicare Other | Admitting: Urology

## 2018-02-18 ENCOUNTER — Other Ambulatory Visit: Payer: Self-pay

## 2018-02-18 ENCOUNTER — Encounter: Payer: Self-pay | Admitting: Urology

## 2018-02-18 VITALS — BP 120/60 | HR 60 | Ht 60.0 in | Wt 154.0 lb

## 2018-02-18 DIAGNOSIS — R310 Gross hematuria: Secondary | ICD-10-CM

## 2018-02-18 DIAGNOSIS — Z9889 Other specified postprocedural states: Secondary | ICD-10-CM | POA: Diagnosis not present

## 2018-02-18 DIAGNOSIS — N200 Calculus of kidney: Secondary | ICD-10-CM | POA: Insufficient documentation

## 2018-02-18 DIAGNOSIS — R918 Other nonspecific abnormal finding of lung field: Secondary | ICD-10-CM | POA: Diagnosis not present

## 2018-02-18 DIAGNOSIS — R1011 Right upper quadrant pain: Secondary | ICD-10-CM | POA: Diagnosis not present

## 2018-02-18 DIAGNOSIS — N201 Calculus of ureter: Secondary | ICD-10-CM

## 2018-02-18 DIAGNOSIS — K5790 Diverticulosis of intestine, part unspecified, without perforation or abscess without bleeding: Secondary | ICD-10-CM | POA: Insufficient documentation

## 2018-02-18 NOTE — Progress Notes (Signed)
   02/18/2018 4:44 PM   Julie Acosta 1933-09-02 [id number]  Reason for visit: Follow up Right renal stone  HPI: I saw Julie Acosta in urology clinic today in follow-up of nephrolithiasis.  She was previously followed by Dr. Pilar Jarvis.  During microscopic hematuria work-up last year she was found to have a 9 mm right renal stone.  She deferred intervention for this.  She is here today for stone surveillance.  KUB suggests her 9 mm stone has migrated to the mid right ureter.  She does report some urgency and frequency over the last few months, as well as a few bouts of gross hematuria with occasional twinges of right-sided flank pain.  She denies any fevers or chills.  She has a distant history of DVT, and is anticoagulated with Xarelto.   ROS: Please see flowsheet from today's date for complete review of systems.  Physical Exam: BP 120/60   Pulse 60   Ht 5' (1.524 m)   Wt 154 lb (69.9 kg)   BMI 30.08 kg/m    Constitutional:  Alert and oriented, No acute distress. Respiratory: Normal respiratory effort, no increased work of breathing. GI: Abdomen is soft, nontender, nondistended, no abdominal masses GU: No CVA tenderness Skin: No rashes, bruises or suspicious lesions. Neurologic: Left AKA Psychiatric: Normal mood and affect   Pertinent Imaging:  I have personally reviewed the KUB and CT renal stone protocol.  The 9 mm stone has migrated to the mid right ureter.  Stone is 9 mm, 730 HU, 13 cm skin to stone distance  Assessment & Plan:   In summary, Julie Acosta is an 84 year old female with mostly asymptomatic right mid ureteral stone.  This was incidentally found on surveillance KUB, and confirmed on CT stone protocol today.  We discussed various treatment options for urolithiasis including observation with or without medical expulsive therapy, shockwave lithotripsy (SWL), ureteroscopy and laser lithotripsy with stent placement, and percutaneous nephrolithotomy.  We discussed  that management is based on stone size, location, density, patient co-morbidities, and patient preference.   Stones <38mm in size have a >80% spontaneous passage rate. Data surrounding the use of tamsulosin for medical expulsive therapy is controversial, but meta analyses suggests it is most efficacious for distal stones between 5-26mm in size. Possible side effects include dizziness/lightheadedness, and retrograde ejaculation.  SWL has a lower stone free rate in a single procedure, but also a lower complication rate compared to ureteroscopy and avoids a stent and associated stent related symptoms. Possible complications include renal hematoma, steinstrasse, and need for additional treatment.  Ureteroscopy with laser lithotripsy and stent placement has a higher stone free rate than SWL in a single procedure, however increased complication rate including possible infection, ureteral injury, bleeding, and stent related morbidity. Common stent related symptoms include dysuria, urgency/frequency, and flank pain.  After an extensive discussion of the risks and benefits of the above treatment options, the patient would like to proceed with RIGHT SWL.  We will schedule right shockwave lithotripsy  Billey Co, MD  [facility] [address], [place], [place] [address] [phone]

## 2018-02-22 ENCOUNTER — Other Ambulatory Visit: Payer: Self-pay | Admitting: Radiology

## 2018-02-22 DIAGNOSIS — N201 Calculus of ureter: Secondary | ICD-10-CM

## 2018-02-24 ENCOUNTER — Other Ambulatory Visit: Payer: Self-pay | Admitting: Radiology

## 2018-02-24 ENCOUNTER — Other Ambulatory Visit: Payer: Medicare Other

## 2018-02-24 DIAGNOSIS — N201 Calculus of ureter: Secondary | ICD-10-CM

## 2018-02-25 LAB — URINALYSIS, COMPLETE
Bilirubin, UA: NEGATIVE
Glucose, UA: NEGATIVE
Ketones, UA: NEGATIVE
NITRITE UA: NEGATIVE
PH UA: 5 (ref 5.0–7.5)
Protein, UA: NEGATIVE
SPEC GRAV UA: 1.025 (ref 1.005–1.030)
UUROB: 0.2 mg/dL (ref 0.2–1.0)

## 2018-02-25 LAB — MICROSCOPIC EXAMINATION: BACTERIA UA: NONE SEEN

## 2018-02-28 LAB — CULTURE, URINE COMPREHENSIVE

## 2018-03-01 ENCOUNTER — Telehealth: Payer: Self-pay | Admitting: Radiology

## 2018-03-01 ENCOUNTER — Encounter: Payer: Self-pay | Admitting: *Deleted

## 2018-03-01 NOTE — Telephone Encounter (Signed)
Patient is scheduled for ESWL on 03/04/2018 & is also scheduled for a flu shot on 03/03/2018. Per Dr Matilde Sprang, advised patient this will not interfere with procedure. Questions answered. Patient voices understanding.

## 2018-03-04 ENCOUNTER — Ambulatory Visit: Payer: Medicare Other

## 2018-03-04 ENCOUNTER — Encounter
Admission: RE | Disposition: A | Discharge status: Home / Self Care | Payer: Self-pay | Source: Ambulatory Visit | Attending: Urology

## 2018-03-04 ENCOUNTER — Ambulatory Visit
Admission: RE | Admit: 2018-03-04 | Discharge: 2018-03-04 | Disposition: A | Discharge status: Home / Self Care | Payer: Medicare Other | Source: Ambulatory Visit | Attending: Urology | Admitting: Urology

## 2018-03-04 ENCOUNTER — Encounter: Payer: Self-pay | Admitting: *Deleted

## 2018-03-04 ENCOUNTER — Other Ambulatory Visit: Payer: Self-pay

## 2018-03-04 DIAGNOSIS — N201 Calculus of ureter: Secondary | ICD-10-CM | POA: Insufficient documentation

## 2018-03-04 DIAGNOSIS — E119 Type 2 diabetes mellitus without complications: Secondary | ICD-10-CM | POA: Insufficient documentation

## 2018-03-04 DIAGNOSIS — E669 Obesity, unspecified: Secondary | ICD-10-CM | POA: Diagnosis not present

## 2018-03-04 DIAGNOSIS — Z6830 Body mass index (BMI) 30.0-30.9, adult: Secondary | ICD-10-CM | POA: Insufficient documentation

## 2018-03-04 HISTORY — PX: EXTRACORPOREAL SHOCK WAVE LITHOTRIPSY: SHX1557

## 2018-03-04 LAB — GLUCOSE, CAPILLARY: Glucose-Capillary: 175 mg/dL — ABNORMAL HIGH (ref 70–99)

## 2018-03-04 SURGERY — LITHOTRIPSY, ESWL
Anesthesia: Moderate Sedation | Laterality: Right

## 2018-03-04 MED ORDER — TAMSULOSIN HCL 0.4 MG PO CAPS: 0.4000 mg | ORAL_CAPSULE | Freq: Every day | ORAL | 0 refills | Status: DC

## 2018-03-04 MED ORDER — ACETAMINOPHEN 500 MG PO TABS: 500.0000 mg | ORAL_TABLET | Freq: Four times a day (QID) | ORAL | 0 refills | Status: AC | PRN

## 2018-03-04 MED ORDER — DIPHENHYDRAMINE HCL 25 MG PO CAPS
25.0000 mg | ORAL_CAPSULE | ORAL | Status: AC
  Administered 2018-03-04: 25 mg via ORAL

## 2018-03-04 MED ORDER — ONDANSETRON HCL 2 MG/ML IV SOLN: 4.0000 mg | Freq: Once | INTRAVENOUS | Status: DC

## 2018-03-04 MED ORDER — DIAZEPAM 5 MG PO TABS
ORAL_TABLET | ORAL | Status: AC
  Administered 2018-03-04: 5 mg via ORAL
  Filled 2018-03-04: qty 1

## 2018-03-04 MED ORDER — ONDANSETRON HCL 4 MG/2ML IJ SOLN
4.0000 mg | Freq: Once | INTRAMUSCULAR | Status: AC
  Administered 2018-03-04: 4 mg via INTRAVENOUS

## 2018-03-04 MED ORDER — ONDANSETRON HCL 4 MG/2ML IJ SOLN
INTRAMUSCULAR | Status: AC
  Administered 2018-03-04: 4 mg via INTRAVENOUS
  Filled 2018-03-04: qty 2

## 2018-03-04 MED ORDER — DIPHENHYDRAMINE HCL 25 MG PO CAPS
ORAL_CAPSULE | ORAL | Status: AC
  Administered 2018-03-04: 25 mg via ORAL
  Filled 2018-03-04: qty 1

## 2018-03-04 MED ORDER — CEFAZOLIN SODIUM-DEXTROSE 2-4 GM/100ML-% IV SOLN
2.0000 g | INTRAVENOUS | Status: AC
  Administered 2018-03-04: 2 g via INTRAVENOUS

## 2018-03-04 MED ORDER — DIAZEPAM 5 MG PO TABS
5.0000 mg | ORAL_TABLET | Freq: Once | ORAL | Status: AC
  Administered 2018-03-04: 5 mg via ORAL

## 2018-03-04 MED ORDER — CEFAZOLIN SODIUM-DEXTROSE 2-4 GM/100ML-% IV SOLN
INTRAVENOUS | Status: AC
  Administered 2018-03-04: 2 g via INTRAVENOUS
  Filled 2018-03-04: qty 100

## 2018-03-04 MED ORDER — SODIUM CHLORIDE 0.9 % IV SOLN
INTRAVENOUS | Status: DC
  Administered 2018-03-04: 100 mL/h via INTRAVENOUS

## 2018-03-04 NOTE — Discharge Instructions (Signed)

## 2018-03-04 NOTE — H&P (Signed)
UROLOGY H&P UPDATE  Agree with prior H&P dated 02/18/2018.   Right mid ureteral stone is 9 mm, 730 HU, 13 cm skin to stone distance.  Cardiac: RRR Lungs: CTA bilaterally  Laterality: RIGHT Procedure: RIGHT SWL  Urinalysis: 10/23 culture no significant growth  Informed consent obtained, we specifically discussed the risk of bleeding, infection, renal hematoma, steinstrasse, obstructive fragments, and possible need for additional procedures.  Billey Co, MD 03/04/2018

## 2018-03-18 ENCOUNTER — Encounter: Payer: Self-pay | Admitting: Urology

## 2018-03-18 ENCOUNTER — Ambulatory Visit
Admission: RE | Admit: 2018-03-18 | Discharge: 2018-03-18 | Disposition: A | Discharge status: Home / Self Care | Payer: Medicare Other | Source: Ambulatory Visit | Attending: Urology | Admitting: Urology

## 2018-03-18 ENCOUNTER — Ambulatory Visit (INDEPENDENT_AMBULATORY_CARE_PROVIDER_SITE_OTHER): Payer: Medicare Other | Admitting: Urology

## 2018-03-18 ENCOUNTER — Other Ambulatory Visit: Payer: Self-pay

## 2018-03-18 VITALS — BP 120/60 | HR 60 | Ht 60.0 in | Wt 154.0 lb

## 2018-03-18 DIAGNOSIS — N200 Calculus of kidney: Secondary | ICD-10-CM | POA: Diagnosis not present

## 2018-03-18 DIAGNOSIS — N201 Calculus of ureter: Secondary | ICD-10-CM | POA: Diagnosis present

## 2018-03-18 NOTE — Progress Notes (Signed)
   03/18/2018 10:18 AM   Julie Acosta [birth year] [id number]  Reason for visit: Follow up s/p SWL 03/04/2018  HPI: I saw Julie Acosta in urology clinic today in follow-up after right shockwave lithotripsy on 03/04/2018.  She was found to have an asymptomatic right 9 mm proximal ureteral stone on surveillance KUB, confirmed with CT scan.  She is doing well postop and denies any complaints.  Specifically she is not having any flank pain or hematuria.  No aggravating or alleviating factors.   ROS: Please see flowsheet from today's date for complete review of systems.  Physical Exam: BP 120/60   Pulse 60   Ht 5' (1.524 m)   Wt 154 lb (69.9 kg)   BMI 30.08 kg/m    Constitutional:  Alert and oriented, No acute distress. Respiratory: Normal respiratory effort, no increased work of breathing. GI: Abdomen is soft, nontender, nondistended, no abdominal masses GU: No CVA tenderness Skin: No rashes, bruises or suspicious lesions. Psychiatric: Normal mood and affect  Pertinent Imaging:  I have personally reviewed the KUB today, no residual stones appreciated.   Assessment & Plan:   In summary, Julie Acosta is an 84 year old female status post successful shockwave lithotripsy on 03/04/2018 for a right proximal ureteral stone.  She is completely asymptomatic and doing well.  We discussed options moving forward including ongoing surveillance with repeat KUB in a year for her small left lower pole stone, however she would like to follow-up on an as-needed basis.  She does have other co-morbidities and this is not unreasonable.  She knows to call us if she develops flank pain, recurrent hematuria, or fevers/chills.   Billey Co, [facility] [address] [place], [place] [address] [phone]

## 2018-03-22 ENCOUNTER — Other Ambulatory Visit: Payer: Self-pay

## 2018-03-22 DIAGNOSIS — Z170 Estrogen receptor positive status [ER+]: Secondary | ICD-10-CM

## 2018-03-22 DIAGNOSIS — C50412 Malignant neoplasm of upper-outer quadrant of left female breast: Secondary | ICD-10-CM

## 2018-03-25 ENCOUNTER — Ambulatory Visit
Admission: RE | Admit: 2018-03-25 | Discharge: 2018-03-25 | Disposition: A | Discharge status: Home / Self Care | Payer: Medicare Other | Source: Ambulatory Visit | Attending: Radiation Oncology | Admitting: Radiation Oncology

## 2018-03-25 ENCOUNTER — Other Ambulatory Visit: Payer: Self-pay

## 2018-03-25 ENCOUNTER — Encounter: Payer: Self-pay | Admitting: *Deleted

## 2018-03-25 ENCOUNTER — Encounter: Payer: Self-pay | Admitting: Radiation Oncology

## 2018-03-25 VITALS — BP 126/66 | HR 84 | Temp 97.6°F | Resp 16

## 2018-03-25 DIAGNOSIS — R21 Rash and other nonspecific skin eruption: Secondary | ICD-10-CM | POA: Diagnosis not present

## 2018-03-25 DIAGNOSIS — Z923 Personal history of irradiation: Secondary | ICD-10-CM | POA: Insufficient documentation

## 2018-03-25 DIAGNOSIS — C50412 Malignant neoplasm of upper-outer quadrant of left female breast: Secondary | ICD-10-CM | POA: Insufficient documentation

## 2018-03-25 DIAGNOSIS — Z79811 Long term (current) use of aromatase inhibitors: Secondary | ICD-10-CM | POA: Diagnosis not present

## 2018-03-25 DIAGNOSIS — Z170 Estrogen receptor positive status [ER+]: Secondary | ICD-10-CM | POA: Insufficient documentation

## 2018-03-25 NOTE — Progress Notes (Signed)
Radiation Oncology Follow up Note  Name: Julie Acosta   Date:   03/25/2018 MRN:  [id number] DOB: 18-Dec-1933    This 84 y.o. 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  REFERRING PROVIDER: Maryland Pink, MD  HPI: patient is an 84 year old female now seen out 6 months having completed accelerated partial breast irradiation to her left breast for stage I ER/PR positive invasive mammary carcinoma. She is seen today in routine follow-up is doing well has developed a slight rash in her left axilla most likely fungal infection. She specifically denies breast tenderness cough or bone pain..she is currently on Femara tolerate that well without side effect.she has not yet had a follow-up mammogram.  COMPLICATIONS OF TREATMENT: none  FOLLOW UP COMPLIANCE: keeps appointments   PHYSICAL EXAM:  BP 126/66 (BP Location: Left Arm, Patient Position: Sitting)   Pulse 84   Temp 97.6 F (36.4 C) (Tympanic)   Resp 16  Lungs are clear to A&P cardiac examination essentially unremarkable with regular rate and rhythm. No dominant mass or nodularity is noted in either breast in 2 positions examined. Incision is well-healed. No axillary or supraclavicular adenopathy is appreciated. Cosmetic result is excellent.small area of erythematous changes of the skin in the left axilla most likely a fungal infection.Well-developed well-nourished patient in NAD. HEENT reveals PERLA, EOMI, discs not visualized.  Oral cavity is clear. No oral mucosal lesions are identified. Neck is clear without evidence of cervical or supraclavicular adenopathy. Lungs are clear to A&P. Cardiac examination is essentially unremarkable with regular rate and rhythm without murmur rub or thrill. Abdomen is benign with no organomegaly or masses noted. Motor sensory and DTR levels are equal and symmetric in the  upper and lower extremities. Cranial nerves II through XII are grossly intact. Proprioception is intact. No peripheral adenopathy or edema is identified. No motor or sensory levels are noted. Crude visual fields are within normal range.  RADIOLOGY RESULTS: no current films for review  PLAN: present time she is doing well with no evidence of disease have asked her to make a follow-up appointment with Dr. Tollie Pizza so he can order her follow-up mammograms. I've also recommended some fungal cream for her left axilla. Otherwise I'm please were overall progress based on her transportation difficulties I've asked to see her back in 1 year for follow-up. Patient is to call with any concerns.  I would like to take this opportunity to thank you for allowing me to participate in the care of your patient.Noreene Filbert, MD

## 2018-04-15 ENCOUNTER — Other Ambulatory Visit: Payer: Self-pay | Admitting: Oncology

## 2018-05-10 ENCOUNTER — Ambulatory Visit (INDEPENDENT_AMBULATORY_CARE_PROVIDER_SITE_OTHER): Payer: Medicare Other | Admitting: Podiatry

## 2018-05-10 ENCOUNTER — Encounter: Payer: Self-pay | Admitting: Podiatry

## 2018-05-10 DIAGNOSIS — B351 Tinea unguium: Secondary | ICD-10-CM

## 2018-05-10 DIAGNOSIS — M79676 Pain in unspecified toe(s): Secondary | ICD-10-CM

## 2018-05-10 DIAGNOSIS — D689 Coagulation defect, unspecified: Secondary | ICD-10-CM

## 2018-05-10 DIAGNOSIS — S88912D Complete traumatic amputation of left lower leg, level unspecified, subsequent encounter: Secondary | ICD-10-CM

## 2018-05-10 DIAGNOSIS — E119 Type 2 diabetes mellitus without complications: Secondary | ICD-10-CM

## 2018-05-10 NOTE — Progress Notes (Signed)
This patient presents the office for continued preventative foot care services on her right foot. She hasn't history of an amputation of the left leg due to bone cancer at 26.  Patient is a type II diabetic.  Patient is presently taking Xarelto  and Paxil.  Patient states that the nails have grown thick and long and are painful walking and wearing her shoes.  She presents the office today for preventative foot care services.     General Appearance  Alert, conversant and in no acute stress.  Vascular  Dorsalis pedis and posterior tibial  pulses are palpable  right..  Capillary return is within normal limits right foot.. Temperature is within normal limits  Right foot.  Neurologic  Senn-Weinstein monofilament wire test within normal limits  . Muscle power within normal limits .  Nails Thick disfigured discolored nails with subungual debris  from hallux to fifth toes right. No evidence of bacterial infection or drainage   Orthopedic  No limitations of motion of motion feet .  No crepitus or effusions noted.  No bony pathology or digital deformities noted. Amputation left leg.  Skin  normotropic skin with no porokeratosis noted right.  No signs of infections or ulcers noted.    Onychomycosis  Amputation left leg.  Diabetes.  \   Debridement and grinding of nails right foot.  ABN signed for 2020.Gardiner Barefoot DPM

## 2018-05-13 ENCOUNTER — Other Ambulatory Visit: Payer: Medicare Other

## 2018-05-17 ENCOUNTER — Telehealth: Payer: Self-pay | Admitting: General Surgery

## 2018-05-17 NOTE — Telephone Encounter (Signed)
Spoke with the patients letting her know we would need to r/s her appointment with Dr. Bary Castilla on 05/25/2018. Patient stated she did not want to see another provider and would like to see Dr. Bary Castilla I did let the patient know we were not sure of a return date as of right now and would call her back to let her know.

## 2018-05-18 ENCOUNTER — Ambulatory Visit: Payer: Medicare Other | Admitting: General Surgery

## 2018-05-18 ENCOUNTER — Ambulatory Visit
Admission: RE | Admit: 2018-05-18 | Discharge: 2018-05-18 | Disposition: A | Discharge status: Home / Self Care | Payer: Medicare Other | Source: Ambulatory Visit | Attending: General Surgery | Admitting: General Surgery

## 2018-05-18 DIAGNOSIS — C50412 Malignant neoplasm of upper-outer quadrant of left female breast: Secondary | ICD-10-CM | POA: Diagnosis present

## 2018-05-18 DIAGNOSIS — Z170 Estrogen receptor positive status [ER+]: Secondary | ICD-10-CM | POA: Diagnosis present

## 2018-05-25 ENCOUNTER — Ambulatory Visit: Payer: Medicare Other | Admitting: General Surgery

## 2018-06-02 NOTE — Telephone Encounter (Signed)
Patients coming in on 06/03/2018 to see dr. Bary Castilla

## 2018-06-03 ENCOUNTER — Ambulatory Visit (INDEPENDENT_AMBULATORY_CARE_PROVIDER_SITE_OTHER): Payer: Medicare Other | Admitting: General Surgery

## 2018-06-03 ENCOUNTER — Encounter: Payer: Self-pay | Admitting: General Surgery

## 2018-06-03 ENCOUNTER — Other Ambulatory Visit: Payer: Self-pay

## 2018-06-03 VITALS — BP 144/79 | HR 65 | Temp 97.7°F | Resp 14 | Ht 60.0 in

## 2018-06-03 DIAGNOSIS — C50412 Malignant neoplasm of upper-outer quadrant of left female breast: Secondary | ICD-10-CM | POA: Diagnosis not present

## 2018-06-03 DIAGNOSIS — Z170 Estrogen receptor positive status [ER+]: Secondary | ICD-10-CM

## 2018-06-03 NOTE — Progress Notes (Signed)
Patient ID: Julie Acosta, female   DOB: 11-18-1933, 85 y.o.   MRN: [id number]  Chief Complaint  Patient presents with  . Follow-up    mammogram    HPI Julie Acosta is a 85 y.o. female.  Here for follow up left breast cancer and mammogram completed 05-18-18. No new breast issues.  HPI  Past Medical History:  Diagnosis Date  . Anxiety   . Benign breast lumps   . Blood clotting disorder ([place])   . Bone cancer ([place])   . Breast cancer of upper-outer quadrant of left female breast ([place]) 07/27/2017   11 mm invasive mammary carcinoma, negative margins.  DCIS, margin less than 0.5 mm.  Negative sentinel node.  Accelerated partial breast radiation.  . Cervicalgia   . Chronic kidney disease    kidney stones  . Colon cancer ([place])   . Complication of anesthesia    MOOD ALTERATION / UNSURE IF ANES OR PAIN MED. NO TROUBLE WITH MOH'S  . Cough    NASAL DRIP / SNEEZING / SORE THROAT MOSTLY CONSTANT  . Depression   . Diabetes ([place])   . Diverticulitis   . Dizzy   . GERD (gastroesophageal reflux disease)   . HBP (high blood pressure)   . Hematuria    gross  . Hemorrhoids   . HLD (hyperlipidemia)   . Muscle pain   . Osteoarthritis   . Osteoporosis   . Panic disorder   . PND (post-nasal drip)    CHRONIC WITH SORE THROAT AND SNEEZING  . Reflux   . Skin cancer    nose  . Swelling   . Tremors of nervous system     Past Surgical History:  Procedure Laterality Date  . APPENDECTOMY  1962  . BREAST BIOPSY Right 2006?   benign  . BREAST CYST EXCISION Left 07/27/2017   Procedure: SKIN CYST EXCISED;  Surgeon: Robert Bellow, MD;  Location: [facility] ORS;  Service: General;  Laterality: Left;  . BREAST LUMPECTOMY Left 07/2017   invasive mammary, DCIS  mammosite  . BREAST SURGERY Right 2019  . CARPAL TUNNEL RELEASE Right   . CATARACT EXTRACTION W/PHACO Left 11/11/2016   Procedure: CATARACT EXTRACTION PHACO AND INTRAOCULAR LENS PLACEMENT (IOC);  Surgeon: Birder Robson, MD;  Location:  [facility] ORS;  Service: Ophthalmology;  Laterality: Left;  [place] 01:07.9 AP% 19.2 CDE 13.02 Fluid pack lot # [id number]  . CATARACT EXTRACTION W/PHACO Right 12/09/2016   Procedure: CATARACT EXTRACTION PHACO AND INTRAOCULAR LENS PLACEMENT (IOC);  Surgeon: Birder Robson, MD;  Location: [facility] ORS;  Service: Ophthalmology;  Laterality: Right;  [place] 00:49 AP% 21.2 CDE 10.39 Fluid pack lot # [id number]  . CHOLECYSTECTOMY    . COLONOSCOPY WITH PROPOFOL N/A 11/22/2014   Procedure: COLONOSCOPY WITH PROPOFOL;  Surgeon: Manya Silvas, MD;  Location[facility];  Service: Endoscopy;  Laterality: N/A;  . ESOPHAGOGASTRODUODENOSCOPY  11/22/2014   Procedure: ESOPHAGOGASTRODUODENOSCOPY (EGD);  Surgeon: Manya Silvas, MD;  Location: [facility] ENDOSCOPY;  Service: Endoscopy;;  . ESOPHAGOGASTRODUODENOSCOPY (EGD) WITH PROPOFOL N/A 03/05/2017   Procedure: ESOPHAGOGASTRODUODENOSCOPY (EGD) WITH PROPOFOL;  Surgeon: Lin Landsman, MD;  Location: [facility] ENDOSCOPY;  Service: Gastroenterology;  Laterality: N/A;  . EXTRACORPOREAL SHOCK WAVE LITHOTRIPSY Right 03/04/2018   Procedure: EXTRACORPOREAL SHOCK WAVE LITHOTRIPSY (ESWL);  Surgeon: Billey Co, MD;  Location: [facility] ORS;  Service: Urology;  Laterality: Right;  . EYE SURGERY  2012   cataract extraction with iol  . GALLBLADDER SURGERY  1968  . hemi pelvectomy    .  HEMIPELVIC Left 1962  . LEG SURGERY Left    AMPUTATION  . MASTECTOMY, PARTIAL Left 07/27/2017   Procedure: MASTECTOMY PARTIAL;  Surgeon: Robert Bellow, MD;  Location: [facility] ORS;  Service: General;  Laterality: Left;  . MOUTH SURGERY  2019  . OVARY SURGERY Right   . SAVORY DILATION  11/22/2014   Procedure: SAVORY DILATION;  Surgeon: Manya Silvas, MD;  Location: [facility] ENDOSCOPY;  Service: Endoscopy;;  . SENTINEL NODE BIOPSY Left 07/27/2017   Procedure: SENTINEL NODE BIOPSY;  Surgeon: Robert Bellow, MD;  Location: [facility] ORS;  Service: General;  Laterality: Left;  . SKIN CANCER EXCISION    .  TONSILLECTOMY      Family History  Problem Relation Age of Onset  . Breast cancer Paternal Aunt   . Ovarian cancer Sister   . Prostate cancer Father   . Stroke Mother   . Kidney cancer Neg Hx   . Bladder Cancer Neg Hx     Social History Social History   Tobacco Use  . Smoking status: Never Smoker  . Smokeless tobacco: Never Used  Substance Use Topics  . Alcohol use: No  . Drug use: No    Allergies  Allergen Reactions  . Ciprofloxacin Hives  . Codeine Other (See Comments)    HALLUCINATIONS  . Tape     Rash and skin irritation/ paper tape and tegaderm OK  . Ciprocinonide [Fluocinolone] Other (See Comments)    unknown  . Amoxicillin Other (See Comments)    Upset stomach Has patient had a PCN reaction causing immediate rash, facial/tongue/throat swelling, SOB or lightheadedness with hypotension: No Has patient had a PCN reaction causing severe rash involving mucus membranes or skin necrosis: No Has patient had a PCN reaction that required hospitalization: No Has patient had a PCN reaction occurring within the last 10 years: Yes If all of the above answers are "NO", then may proceed with Cephalosporin use.;  . Cefuroxime Other (See Comments)    OK INTRACAMERALLY PER DR WLP, upset stomach  . Lipitor [Atorvastatin] Other (See Comments)    unknown    Current Outpatient Medications  Medication Sig Dispense Refill  . acetaminophen (TYLENOL) 500 MG tablet Take 500 mg by mouth every 6 (six) hours as needed (for pain/headaches.).    Marland Kitchen albuterol (PROVENTIL HFA;VENTOLIN HFA) 108 (90 Base) MCG/ACT inhaler Inhale 2 puffs into the lungs every 6 (six) hours as needed for wheezing or shortness of breath. 1 Inhaler 2  . alendronate (FOSAMAX) 70 MG tablet Take 1 tablet (70 mg total) by mouth once a week. Take with a full glass of water on an empty stomach. 4 tablet 12  . Blood Glucose Monitoring Suppl (FIFTY50 GLUCOSE METER 2.0) w/Device KIT Use as directed    . calcium carbonate  (OS-CAL - DOSED IN MG OF ELEMENTAL CALCIUM) 1250 (500 Ca) MG tablet Take 1 tablet by mouth daily.    . fenofibrate 160 MG tablet Take 160 mg by mouth daily.     Marland Kitchen GLIPIZIDE XL 2.5 MG 24 hr tablet Take 2.5 mg by mouth daily.     Marland Kitchen letrozole (FEMARA) 2.5 MG tablet TAKE ONE TABLET EVERY DAY 30 tablet 3  . loratadine (CLARITIN) 10 MG tablet Take 10 mg by mouth daily.    Marland Kitchen losartan (COZAAR) 50 MG tablet Take 50 mg by mouth daily.     . metFORMIN (GLUCOPHAGE) 500 MG tablet Take 1,000 mg by mouth 2 (two) times daily.     . metoprolol  succinate (TOPROL-XL) 25 MG 24 hr tablet Take 12.5 mg by mouth at bedtime.     Marland Kitchen omeprazole (PRILOSEC) 20 MG capsule Take 20 mg by mouth daily.    Marland Kitchen PARoxetine (PAXIL) 40 MG tablet Take 40 mg by mouth daily.     Vladimir Faster Glycol-Propyl Glycol (SYSTANE OP) Place 1 drop into the right eye 2 (two) times daily as needed (dry eyes).    . raloxifene (EVISTA) 60 MG tablet Take 60 mg by mouth daily.     . tamsulosin (FLOMAX) 0.4 MG CAPS capsule Take 1 capsule (0.4 mg total) by mouth daily after supper. 30 capsule 0  . predniSONE (DELTASONE) 20 MG tablet Take 20 mg by mouth daily with breakfast.     No current facility-administered medications for this visit.     Review of Systems Review of Systems  Constitutional: Negative.   Respiratory: Negative.   Cardiovascular: Negative.     Blood pressure (!) 144/79, pulse 65, temperature 97.7 F (36.5 C), temperature source Temporal, resp. rate 14, height 5' (1.524 m), SpO2 95 %.  Physical Exam Physical Exam Vitals signs reviewed. Exam conducted with a chaperone present.  Constitutional:      Appearance: Normal appearance.  Neck:     Musculoskeletal: Normal range of motion.  Cardiovascular:     Rate and Rhythm: Normal rate and regular rhythm.  Pulmonary:     Effort: Pulmonary effort is normal.     Breath sounds: Normal breath sounds.  Chest:    Lymphadenopathy:     Cervical: No cervical adenopathy.     Upper Body:      Right upper body: No supraclavicular or axillary adenopathy.     Left upper body: No supraclavicular or axillary adenopathy.  Neurological:     Mental Status: She is alert.     Data Reviewed Bilateral diagnostic mammograms of May 18, 2018 were reviewed.  Minimal postsurgical scarring.  BI-RADS-2.  Assessment    No evidence of recurrent breast cancer.  Good tolerance of Evista therapy.      Plan    We will plan for a follow-up exam with bilateral diagnostic mammograms in 1 year.     HPI has been scribed under the direction and in the presence of Robert Bellow, MD. Karie Fetch, RN  I have completed the exam and reviewed the above documentation for accuracy and completeness.  I agree with the above.  Haematologist has been used and any errors in dictation or transcription are unintentional.  Hervey Ard, M.D., F.A.C.S.   Forest Gleason Shandelle Borrelli 06/03/2018, 10:55 AM

## 2018-06-03 NOTE — Patient Instructions (Addendum)
The patient is aware to call back for any questions or concerns. The patient has been asked to return to the office in one year with a bilateral diagnostic mammogram. 

## 2018-06-04 ENCOUNTER — Telehealth (INDEPENDENT_AMBULATORY_CARE_PROVIDER_SITE_OTHER): Payer: Self-pay | Admitting: Vascular Surgery

## 2018-06-27 NOTE — Progress Notes (Deleted)
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  Oncotype DX score 1.  INTERVAL HISTORY: Patient returns to clinic today for further evaluation and to assess her toleration of letrozole.  She currently feels well and is asymptomatic. She has no neurologic complaints.  She denies any recent fevers or illnesses.  She has a good appetite and denies weight loss.  She has no chest pain or shortness of breath.  She denies any nausea, vomiting, constipation, or diarrhea.  She has no urinary complaints.  Patient feels at her baseline offers no specific complaints today.  REVIEW OF SYSTEMS:   Review of Systems  Constitutional: Negative.  Negative for fever, malaise/fatigue and weight loss.  Respiratory: Negative.  Negative for cough and shortness of breath.   Cardiovascular: Negative.  Negative for chest pain and leg swelling.  Gastrointestinal: Negative.  Negative for abdominal pain.  Genitourinary: Negative.  Negative for dysuria.  Musculoskeletal: Positive for joint pain.  Skin: Negative.  Negative for rash.  Neurological: Negative.  Negative for sensory change and weakness.  Psychiatric/Behavioral: Negative.  The patient is not nervous/anxious.     As per HPI. Otherwise, a complete review of systems is negative.  PAST MEDICAL HISTORY: Past Medical History:  Diagnosis Date  . Anxiety   . Benign breast lumps   . Blood clotting disorder ([place])   . Bone cancer ([place])   . Breast cancer of upper-outer quadrant of left female breast ([place]) 07/27/2017   11 mm invasive mammary carcinoma, ER 90%, PR 51-90%, negative margins.  Oncotype recurrence score: 1.   DCIS, margin less than 0.5 mm.  Negative sentinel node.  Accelerated partial breast radiation.  . Cervicalgia   . Chronic kidney disease    kidney stones  . Colon cancer ([place])   . Complication of anesthesia    MOOD ALTERATION / UNSURE IF ANES OR PAIN MED. NO TROUBLE WITH MOH'S  . Cough    NASAL DRIP / SNEEZING / SORE THROAT MOSTLY CONSTANT  . Depression   . Diabetes ([place])   . Diverticulitis   . Dizzy   . GERD (gastroesophageal reflux disease)   . HBP (high blood pressure)   . Hematuria    gross  . Hemorrhoids   . HLD (hyperlipidemia)   . Muscle pain   . Osteoarthritis   . Osteoporosis   . Panic disorder   . PND (post-nasal drip)    CHRONIC WITH SORE THROAT AND SNEEZING  . Reflux   . Skin cancer    nose  . Swelling   . Tremors of nervous system     PAST SURGICAL HISTORY: Past Surgical History:  Procedure Laterality Date  . APPENDECTOMY  1962  . BREAST BIOPSY Right 2006?   benign  . BREAST CYST EXCISION Left 07/27/2017   Procedure: SKIN CYST EXCISED;  Surgeon: Robert Bellow, MD;  Location: [facility] ORS;  Service: General;  Laterality: Left;  . BREAST LUMPECTOMY Left 07/2017   invasive mammary, DCIS  mammosite  . BREAST SURGERY Right 2019  . CARPAL TUNNEL RELEASE Right   . CATARACT EXTRACTION W/PHACO Left 11/11/2016   Procedure: CATARACT EXTRACTION PHACO AND INTRAOCULAR LENS PLACEMENT (IOC);  Surgeon: Birder Robson, MD;  Location: [facility] ORS;  Service: Ophthalmology;  Laterality: Left;  [place] 01:07.9 AP% 19.2 CDE 13.02 Fluid pack lot # [id number]  . CATARACT EXTRACTION W/PHACO Right 12/09/2016   Procedure: CATARACT EXTRACTION PHACO AND INTRAOCULAR LENS PLACEMENT (IOC);  Surgeon: Birder Robson, MD;  Location: [facility] ORS;  Service: Ophthalmology;  Laterality: Right;  [place] 00:49 AP% 21.2 CDE 10.39 Fluid pack lot # [id number]  . CHOLECYSTECTOMY    . COLONOSCOPY WITH PROPOFOL N/A 11/22/2014   Procedure: COLONOSCOPY WITH PROPOFOL;  Surgeon: Manya Silvas, MD;  Location: [facility]  ENDOSCOPY;  Service: Endoscopy;  Laterality: N/A;  . ESOPHAGOGASTRODUODENOSCOPY  11/22/2014   Procedure: ESOPHAGOGASTRODUODENOSCOPY (EGD);  Surgeon: Manya Silvas, MD;  Location: [facility] ENDOSCOPY;  Service: Endoscopy;;  . ESOPHAGOGASTRODUODENOSCOPY (EGD) WITH PROPOFOL N/A 03/05/2017   Procedure: ESOPHAGOGASTRODUODENOSCOPY (EGD) WITH PROPOFOL;  Surgeon: Lin Landsman, MD;  Location[facility] ENDOSCOPY;  Service: Gastroenterology;  Laterality: N/A;  . EXTRACORPOREAL SHOCK WAVE LITHOTRIPSY Right 03/04/2018   Procedure: EXTRACORPOREAL SHOCK WAVE LITHOTRIPSY (ESWL);  Surgeon: Billey Co, MD;  Location: [facility] ORS;  Service: Urology;  Laterality: Right;  . EYE SURGERY  2012   cataract extraction with iol  . GALLBLADDER SURGERY  1968  . hemi pelvectomy    . HEMIPELVIC Left 1962  . LEG SURGERY Left    AMPUTATION  . MASTECTOMY, PARTIAL Left 07/27/2017   Procedure: MASTECTOMY PARTIAL;  Surgeon: Robert Bellow, MD;  Location: [facility] ORS;  Service: General;  Laterality: Left;  . MOUTH SURGERY  2019  . OVARY SURGERY Right   . SAVORY DILATION  11/22/2014   Procedure: SAVORY DILATION;  Surgeon: Manya Silvas, MD;  Location: [facility] ENDOSCOPY;  Service: Endoscopy;;  . SENTINEL NODE BIOPSY Left 07/27/2017   Procedure: SENTINEL NODE BIOPSY;  Surgeon: Robert Bellow, MD;  Location: [facility] ORS;  Service: General;  Laterality: Left;  . SKIN CANCER EXCISION    . TONSILLECTOMY      FAMILY HISTORY: Family History  Problem Relation Age of Onset  . Breast cancer Paternal Aunt   . Ovarian cancer Sister   . Prostate cancer Father   . Stroke Mother   . Kidney cancer Neg Hx   . Bladder Cancer Neg Hx     ADVANCED DIRECTIVES (Y/N):  N  HEALTH MAINTENANCE: Social History   Tobacco Use  . Smoking status: Never Smoker  . Smokeless tobacco: Never Used  Substance Use Topics  . Alcohol use: No  . Drug use: No     Colonoscopy:  PAP:  Bone density:  Lipid panel:  Allergies  Allergen Reactions    . Ciprofloxacin Hives  . Codeine Other (See Comments)    HALLUCINATIONS  . Tape     Rash and skin irritation/ paper tape and tegaderm OK  . Ciprocinonide [Fluocinolone] Other (See Comments)    unknown  . Amoxicillin Other (See Comments)    Upset stomach Has patient had a PCN reaction causing immediate rash, facial/tongue/throat swelling, SOB or lightheadedness with hypotension: No Has patient had a PCN reaction causing severe rash involving mucus membranes or skin necrosis: No Has patient had a PCN reaction that required hospitalization: No Has patient had a PCN reaction occurring within the last 10 years: Yes If all of the above answers are "NO", then may proceed with Cephalosporin use.;  . Cefuroxime Other (See Comments)    OK INTRACAMERALLY PER DR WLP, upset stomach  . Lipitor [Atorvastatin] Other (See Comments)    unknown    Current Outpatient Medications  Medication  Sig Dispense Refill  . acetaminophen (TYLENOL) 500 MG tablet Take 500 mg by mouth every 6 (six) hours as needed (for pain/headaches.).    Marland Kitchen albuterol (PROVENTIL HFA;VENTOLIN HFA) 108 (90 Base) MCG/ACT inhaler Inhale 2 puffs into the lungs every 6 (six) hours as needed for wheezing or shortness of breath. 1 Inhaler 2  . alendronate (FOSAMAX) 70 MG tablet Take 1 tablet (70 mg total) by mouth once a week. Take with a full glass of water on an empty stomach. 4 tablet 12  . Blood Glucose Monitoring Suppl (FIFTY50 GLUCOSE METER 2.0) w/Device KIT Use as directed    . calcium carbonate (OS-CAL - DOSED IN MG OF ELEMENTAL CALCIUM) 1250 (500 Ca) MG tablet Take 1 tablet by mouth daily.    . fenofibrate 160 MG tablet Take 160 mg by mouth daily.     Marland Kitchen GLIPIZIDE XL 2.5 MG 24 hr tablet Take 2.5 mg by mouth daily.     Marland Kitchen letrozole (FEMARA) 2.5 MG tablet TAKE ONE TABLET EVERY DAY 30 tablet 3  . loratadine (CLARITIN) 10 MG tablet Take 10 mg by mouth daily.    Marland Kitchen losartan (COZAAR) 50 MG tablet Take 50 mg by mouth daily.     . metFORMIN  (GLUCOPHAGE) 500 MG tablet Take 1,000 mg by mouth 2 (two) times daily.     . metoprolol succinate (TOPROL-XL) 25 MG 24 hr tablet Take 12.5 mg by mouth at bedtime.     Marland Kitchen omeprazole (PRILOSEC) 20 MG capsule Take 20 mg by mouth daily.    Marland Kitchen PARoxetine (PAXIL) 40 MG tablet Take 40 mg by mouth daily.     Vladimir Faster Glycol-Propyl Glycol (SYSTANE OP) Place 1 drop into the right eye 2 (two) times daily as needed (dry eyes).    . predniSONE (DELTASONE) 20 MG tablet Take 20 mg by mouth daily with breakfast.    . raloxifene (EVISTA) 60 MG tablet Take 60 mg by mouth daily.     . tamsulosin (FLOMAX) 0.4 MG CAPS capsule Take 1 capsule (0.4 mg total) by mouth daily after supper. 30 capsule 0   No current facility-administered medications for this visit.     OBJECTIVE: There were no vitals filed for this visit.   There is no height or weight on file to calculate BMI.    ECOG FS:0 - Asymptomatic  General: Well-developed, well-nourished, no acute distress. Eyes: Pink conjunctiva, anicteric sclera. HEENT: Normocephalic, moist mucous membranes. Breast: Exam deferred today. Lungs: Clear to auscultation bilaterally. Heart: Regular rate and rhythm. No rubs, murmurs, or gallops. Abdomen: Soft, nontender, nondistended. No organomegaly noted, normoactive bowel sounds. Musculoskeletal: No edema, cyanosis, or clubbing. Neuro: Alert, answering all questions appropriately. Cranial nerves grossly intact. Skin: No rashes or petechiae noted. Psych: Normal affect.  LAB RESULTS:  Lab Results  Component Value Date   NA 133 (L) 10/02/2017   K 4.3 10/02/2017   CL 101 10/02/2017   CO2 22 10/02/2017   GLUCOSE 270 (H) 10/02/2017   BUN 23 (H) 10/02/2017   CREATININE 0.79 10/02/2017   CALCIUM 9.7 10/02/2017   PROT 7.0 10/02/2017   ALBUMIN 3.7 10/02/2017   AST 23 10/02/2017   ALT 12 (L) 10/02/2017   ALKPHOS 47 10/02/2017   BILITOT 0.8 10/02/2017   GFRNONAA >60 10/02/2017   GFRAA >60 10/02/2017    Lab Results    Component Value Date   WBC 11.4 (H) 10/02/2017   HGB 13.6 10/02/2017   HCT 40.8 10/02/2017   MCV 91.1 10/02/2017  PLT 150 10/02/2017     STUDIES: No results found.  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  Oncotype DX score 1  PLAN:    1.  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  Patient had a lumpectomy on July 27, 2017.  She completed radiation with MammoSite treatment.  Continue letrozole for a total of 5 years completing treatment in May 2024.  Patient has requested less frequent follow-up, therefore she will return to clinic in 6 months for routine evaluation. 2.  Osteopenia: Patient had a bone marrow density on Sep 23, 2017 reported T score of -2.4.  She was initiated on Fosamax and instructed to take calcium and vitamin D supplementation.   Patient expressed understanding and was in agreement with this plan. She also understands that She can call clinic at any time with any questions, concerns, or complaints.   Cancer Staging Primary cancer of upper outer quadrant of left female breast [facility]) Staging form: Breast, AJCC 8th Edition - Clinical stage from 06/12/2017: Stage IA (cT1b, cN0, cM0, G2, ER: Positive, PR: Positive, HER2: Negative) - Signed by Lloyd Huger, MD on 06/12/2017   Lloyd Huger, MD   06/27/2018 11:19 PM

## 2018-06-30 ENCOUNTER — Inpatient Hospital Stay: Payer: Medicare Other | Attending: Oncology | Admitting: Oncology

## 2018-07-05 NOTE — Progress Notes (Signed)
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  Oncotype DX score 1.  INTERVAL HISTORY: Patient returns to clinic today for routine 48-monthevaluation.  She has not taken letrozole for some unknown period of time stating "I had no refills".  Prior to that she is tolerating treatment well without significant side effects. She currently feels well and is asymptomatic. She has no neurologic complaints.  She denies any recent fevers or illnesses.  She has a good appetite and denies weight loss.  She has no chest pain or shortness of breath.  She denies any nausea, vomiting, constipation, or diarrhea.  She has no urinary complaints.  Patient offers no specific complaints today.  REVIEW OF SYSTEMS:   Review of Systems  Constitutional: Negative.  Negative for fever, malaise/fatigue and weight loss.  Respiratory: Negative.  Negative for cough and shortness of breath.   Cardiovascular: Negative.  Negative for chest pain and leg swelling.  Gastrointestinal: Negative.  Negative for abdominal pain.  Genitourinary: Negative.  Negative for dysuria.  Musculoskeletal: Negative.  Negative for joint pain.  Skin: Negative.  Negative for rash.  Neurological: Negative.  Negative for dizziness, sensory change, weakness and headaches.  Psychiatric/Behavioral: Negative.  The patient is not nervous/anxious.     As per HPI. Otherwise, a complete review of systems is negative.  PAST MEDICAL HISTORY: Past Medical History:  Diagnosis Date  . Anxiety   . Benign breast lumps   . [place]   . [place] cancer ([place]   . [place]  [place] 07/[place]2017   11 mm invasive mammary carcinoma, ER 90%, PR 51-90%, negative margins.  Oncotype recurrence score: 1.  DCIS, margin less than 0.5 mm.  Negative sentinel node.  Accelerated partial breast radiation.  . Cervicalgia   . Chronic kidney disease    kidney stones  . Colon cancer ([place]   . Complication of anesthesia    MOOD ALTERATION / UNSURE IF ANES OR PAIN MED. NO TROUBLE WITH MOH'S  . Cough    NASAL DRIP / SNEEZING / SORE THROAT MOSTLY CONSTANT  . Depression   . Diabetes ([place]   . Diverticulitis   . Dizzy   . GERD (gastroesophageal reflux disease)   . HBP (high blood pressure)   . Hematuria    gross  . Hemorrhoids   . HLD (hyperlipidemia)   . Muscle pain   . Osteoarthritis   . Osteoporosis   . Panic disorder   . PND (post-nasal drip)    CHRONIC WITH SORE THROAT AND SNEEZING  . Reflux   . Skin cancer    nose  . Swelling   . Tremors of nervous system     PAST SURGICAL HISTORY: Past Surgical History:  Procedure Laterality Date  . APPENDECTOMY  1962  . BREAST BIOPSY Right 2006?   benign  . BREAST CYST EXCISION Left 07/27/2017   Procedure: SKIN CYST EXCISED;  Surgeon: BRobert Bellow MD;  Location: [facility] ORS;  Service: General;  Laterality: Left;  . BREAST LUMPECTOMY Left 07/2017   invasive mammary, DCIS  mammosite  . BREAST SURGERY Right 2019  . CARPAL TUNNEL RELEASE Right   .  CATARACT EXTRACTION W/PHACO Left 11/11/2016   Procedure: CATARACT EXTRACTION PHACO AND INTRAOCULAR LENS PLACEMENT (IOC);  Surgeon: Birder Robson, MD;  Location: [facility] ORS;  Service: Ophthalmology;  Laterality: Left;  [place] 01:07.9 AP% 19.2 CDE 13.02 Fluid pack lot # [id number]  . CATARACT EXTRACTION W/PHACO Right 12/09/2016   Procedure: CATARACT EXTRACTION PHACO AND INTRAOCULAR LENS PLACEMENT (IOC);  Surgeon: Birder Robson, MD;  Location: [facility] ORS;  Service: Ophthalmology;  Laterality: Right;  [place] 00:49 AP% 21.2 CDE 10.39 Fluid pack lot # [id number]    . CHOLECYSTECTOMY    . COLONOSCOPY WITH PROPOFOL N/A 11/22/2014   Procedure: COLONOSCOPY WITH PROPOFOL;  Surgeon: Manya Silvas, MD;  Location: [facility] ENDOSCOPY;  Service: Endoscopy;  Laterality: N/A;  . ESOPHAGOGASTRODUODENOSCOPY  11/22/2014   Procedure: ESOPHAGOGASTRODUODENOSCOPY (EGD);  Surgeon: Manya Silvas, MD;  Location: [facility] ENDOSCOPY;  Service: Endoscopy;;  . ESOPHAGOGASTRODUODENOSCOPY (EGD) WITH PROPOFOL N/A 03/05/2017   Procedure: ESOPHAGOGASTRODUODENOSCOPY (EGD) WITH PROPOFOL;  Surgeon: Lin Landsman, MD;  Location: [facility] ENDOSCOPY;  Service: Gastroenterology;  Laterality: N/A;  . EXTRACORPOREAL SHOCK WAVE LITHOTRIPSY Right 03/04/2018   Procedure: EXTRACORPOREAL SHOCK WAVE LITHOTRIPSY (ESWL);  Surgeon: Billey Co, MD;  Location: [facility] ORS;  Service: Urology;  Laterality: Right;  . EYE SURGERY  2012   cataract extraction with iol  . GALLBLADDER SURGERY  1968  . hemi pelvectomy    . HEMIPELVIC Left 1962  . LEG SURGERY Left    AMPUTATION  . MASTECTOMY, PARTIAL Left 07/27/2017   Procedure: MASTECTOMY PARTIAL;  Surgeon: Robert Bellow, MD;  Location: [facility] ORS;  Service: General;  Laterality: Left;  . MOUTH SURGERY  2019  . OVARY SURGERY Right   . SAVORY DILATION  11/22/2014   Procedure: SAVORY DILATION;  Surgeon: Manya Silvas, MD;  Location: [facility] ENDOSCOPY;  Service: Endoscopy;;  . SENTINEL NODE BIOPSY Left 07/27/2017   Procedure: SENTINEL NODE BIOPSY;  Surgeon: Robert Bellow, MD;  Location: [facility] ORS;  Service: General;  Laterality: Left;  . SKIN CANCER EXCISION    . TONSILLECTOMY      FAMILY HISTORY: Family History  Problem Relation Age of Onset  . Breast cancer Paternal Aunt   . Ovarian cancer Sister   . Prostate cancer Father   . Stroke Mother   . Kidney cancer Neg Hx   . Bladder Cancer Neg Hx     ADVANCED DIRECTIVES (Y/N):  N  HEALTH MAINTENANCE: Social History   Tobacco Use  . Smoking status: Never Smoker  . Smokeless tobacco: Never  Used  Substance Use Topics  . Alcohol use: No  . Drug use: No     Colonoscopy:  PAP:  Bone density:  Lipid panel:  Allergies  Allergen Reactions  . Ciprofloxacin Hives  . Codeine Other (See Comments)    HALLUCINATIONS  . Tape     Rash and skin irritation/ paper tape and tegaderm OK  . Ciprocinonide [Fluocinolone] Other (See Comments)    unknown  . Amoxicillin Other (See Comments)    Upset stomach Has patient had a PCN reaction causing immediate rash, facial/tongue/throat swelling, SOB or lightheadedness with hypotension: No Has patient had a PCN reaction causing severe rash involving mucus membranes or skin necrosis: No Has patient had a PCN reaction that required hospitalization: No Has patient had a PCN reaction occurring within the last 10 years: Yes If all of the above answers are "NO", then may proceed with Cephalosporin use.;  . Cefuroxime Other (See Comments)    OK INTRACAMERALLY PER DR  WLP, upset stomach  . Lipitor [Atorvastatin] Other (See Comments)    unknown    Current Outpatient Medications  Medication Sig Dispense Refill  . acetaminophen (TYLENOL) 500 MG tablet Take 500 mg by mouth every 6 (six) hours as needed (for pain/headaches.).    Marland Kitchen albuterol (PROVENTIL HFA;VENTOLIN HFA) 108 (90 Base) MCG/ACT inhaler Inhale 2 puffs into the lungs every 6 (six) hours as needed for wheezing or shortness of breath. 1 Inhaler 2  . Blood Glucose Monitoring Suppl (FIFTY50 GLUCOSE METER 2.0) w/Device KIT Use as directed    . calcium carbonate (OS-CAL - DOSED IN MG OF ELEMENTAL CALCIUM) 1250 (500 Ca) MG tablet Take 1 tablet by mouth daily.    . fenofibrate 160 MG tablet Take 160 mg by mouth daily.     Marland Kitchen GLIPIZIDE XL 2.5 MG 24 hr tablet Take 2.5 mg by mouth daily.     Marland Kitchen loratadine (CLARITIN) 10 MG tablet Take 10 mg by mouth daily.    Marland Kitchen losartan (COZAAR) 50 MG tablet Take 50 mg by mouth daily.     . metFORMIN (GLUCOPHAGE) 500 MG tablet Take 1,000 mg by mouth 2 (two) times daily.      . metoprolol succinate (TOPROL-XL) 25 MG 24 hr tablet Take 12.5 mg by mouth at bedtime.     Marland Kitchen omeprazole (PRILOSEC) 20 MG capsule Take 20 mg by mouth daily.    Marland Kitchen PARoxetine (PAXIL) 40 MG tablet Take 40 mg by mouth daily.     . raloxifene (EVISTA) 60 MG tablet Take 60 mg by mouth daily.     . rivaroxaban (XARELTO) 20 MG TABS tablet Take 20 mg by mouth daily with supper.    Marland Kitchen alendronate (FOSAMAX) 70 MG tablet Take 1 tablet (70 mg total) by mouth once a week. Take with a full glass of water on an empty stomach. (Patient not taking: Reported on 07/09/2018) 4 tablet 12  . letrozole (FEMARA) 2.5 MG tablet Take 1 tablet (2.5 mg total) by mouth daily. 30 tablet 3  . tamsulosin (FLOMAX) 0.4 MG CAPS capsule Take 1 capsule (0.4 mg total) by mouth daily after supper. (Patient not taking: Reported on 07/09/2018) 30 capsule 0   No current facility-administered medications for this visit.     OBJECTIVE: Vitals:   07/09/18 1433  BP: 136/72  Pulse: 93  Temp: 97.8 F (36.6 C)     Body mass index is 30.08 kg/m.    ECOG FS:0 - Asymptomatic  General: Well-developed, well-nourished, no acute distress. Eyes: Pink conjunctiva, anicteric sclera. HEENT: Normocephalic, moist mucous membranes. Breast: Patient declined breast exam today. Lungs: Clear to auscultation bilaterally. Heart: Regular rate and rhythm. No rubs, murmurs, or gallops. Abdomen: Soft, nontender, nondistended. No organomegaly noted, normoactive bowel sounds. Musculoskeletal: Left hemipelvectomy and amputation. Neuro: Alert, answering all questions appropriately. Cranial nerves grossly intact. Skin: No rashes or petechiae noted. Psych: Normal affect.  LAB RESULTS:  Lab Results  Component Value Date   NA 133 (L) 10/02/2017   K 4.3 10/02/2017   CL 101 10/02/2017   CO2 22 10/02/2017   GLUCOSE 270 (H) 10/02/2017   BUN 23 (H) 10/02/2017   CREATININE 0.79 10/02/2017   CALCIUM 9.7 10/02/2017   PROT 7.0 10/02/2017   ALBUMIN 3.7 10/02/2017    AST 23 10/02/2017   ALT 12 (L) 10/02/2017   ALKPHOS 47 10/02/2017   BILITOT 0.8 10/02/2017   GFRNONAA >60 10/02/2017   GFRAA >60 10/02/2017    Lab Results  Component Value Date  WBC 11.4 (H) 10/02/2017   HGB 13.6 10/02/2017   HCT 40.8 10/02/2017   MCV 91.1 10/02/2017   PLT 150 10/02/2017     STUDIES: No results found.  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  Oncotype DX score 1  PLAN:    1.  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  Patient had a lumpectomy on July 27, 2017.  She completed radiation with MammoSite treatment.  Patient was given a refill of her letrozole today which she will be required to take for a total of 5 years completing in May 2024.  Her most recent mammogram on May 18, 2018 was reported as BI-RADS 2.  Repeat in January 2021.  Return to clinic in 6 months for routine evaluation.   2.  Osteopenia: Patient had a bone marrow density on Sep 23, 2017 reported T score of -2.4.  Patient refused Fosamax, but has agreed to Prolia every 6 months.  Return to clinic as above for further evaluation laboratory work and Prolia.  Continue calcium and vitamin D supplementation.  Repeat bone mineral density in May 2020.   3.  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  Patient expressed understanding and was in agreement with this plan. She also understands that She can call clinic at any time with any questions, concerns, or complaints.   Cancer Staging Primary cancer of upper outer quadrant of left female breast [facility]) Staging form: Breast, AJCC 8th Edition - Clinical stage from 06/12/2017: Stage IA  (cT1b, cN0, cM0, G2, ER: Positive, PR: Positive, HER2: Negative) - Signed by Lloyd Huger, MD on 06/12/2017   Lloyd Huger, MD   07/11/2018 9:16 AM

## 2018-07-09 ENCOUNTER — Other Ambulatory Visit: Payer: Self-pay

## 2018-07-09 ENCOUNTER — Encounter: Payer: Self-pay | Admitting: Oncology

## 2018-07-09 ENCOUNTER — Inpatient Hospital Stay: Payer: Medicare Other | Attending: Oncology | Admitting: Oncology

## 2018-07-09 VITALS — BP 136/72 | HR 93 | Temp 97.8°F | Ht 60.0 in

## 2018-07-09 DIAGNOSIS — Z170 Estrogen receptor positive status [ER+]: Secondary | ICD-10-CM | POA: Diagnosis not present

## 2018-07-09 DIAGNOSIS — Z79811 Long term (current) use of aromatase inhibitors: Secondary | ICD-10-CM

## 2018-07-09 DIAGNOSIS — M542 Cervicalgia: Secondary | ICD-10-CM

## 2018-07-09 DIAGNOSIS — E119 Type 2 diabetes mellitus without complications: Secondary | ICD-10-CM | POA: Insufficient documentation

## 2018-07-09 DIAGNOSIS — Z803 Family history of malignant neoplasm of breast: Secondary | ICD-10-CM | POA: Diagnosis not present

## 2018-07-09 DIAGNOSIS — K219 Gastro-esophageal reflux disease without esophagitis: Secondary | ICD-10-CM | POA: Insufficient documentation

## 2018-07-09 DIAGNOSIS — E785 Hyperlipidemia, unspecified: Secondary | ICD-10-CM | POA: Insufficient documentation

## 2018-07-09 DIAGNOSIS — Z85038 Personal history of other malignant neoplasm of large intestine: Secondary | ICD-10-CM | POA: Diagnosis not present

## 2018-07-09 DIAGNOSIS — Z8041 Family history of malignant neoplasm of ovary: Secondary | ICD-10-CM | POA: Diagnosis not present

## 2018-07-09 DIAGNOSIS — Z85828 Personal history of other malignant neoplasm of skin: Secondary | ICD-10-CM | POA: Insufficient documentation

## 2018-07-09 DIAGNOSIS — Z79899 Other long term (current) drug therapy: Secondary | ICD-10-CM

## 2018-07-09 DIAGNOSIS — Z7901 Long term (current) use of anticoagulants: Secondary | ICD-10-CM | POA: Insufficient documentation

## 2018-07-09 DIAGNOSIS — F329 Major depressive disorder, single episode, unspecified: Secondary | ICD-10-CM | POA: Diagnosis not present

## 2018-07-09 DIAGNOSIS — Z7984 Long term (current) use of oral hypoglycemic drugs: Secondary | ICD-10-CM

## 2018-07-09 DIAGNOSIS — F419 Anxiety disorder, unspecified: Secondary | ICD-10-CM | POA: Diagnosis not present

## 2018-07-09 DIAGNOSIS — Z8589 Personal history of malignant neoplasm of other organs and systems: Secondary | ICD-10-CM

## 2018-07-09 DIAGNOSIS — Z8042 Family history of malignant neoplasm of prostate: Secondary | ICD-10-CM | POA: Insufficient documentation

## 2018-07-09 DIAGNOSIS — N189 Chronic kidney disease, unspecified: Secondary | ICD-10-CM

## 2018-07-09 DIAGNOSIS — C50412 Malignant neoplasm of upper-outer quadrant of left female breast: Secondary | ICD-10-CM | POA: Insufficient documentation

## 2018-07-09 DIAGNOSIS — Z87442 Personal history of urinary calculi: Secondary | ICD-10-CM | POA: Diagnosis not present

## 2018-07-09 DIAGNOSIS — I129 Hypertensive chronic kidney disease with stage 1 through stage 4 chronic kidney disease, or unspecified chronic kidney disease: Secondary | ICD-10-CM | POA: Diagnosis not present

## 2018-07-09 DIAGNOSIS — R310 Gross hematuria: Secondary | ICD-10-CM

## 2018-07-09 DIAGNOSIS — M1990 Unspecified osteoarthritis, unspecified site: Secondary | ICD-10-CM

## 2018-07-09 MED ORDER — LETROZOLE 2.5 MG PO TABS: 2.5000 mg | ORAL_TABLET | Freq: Every day | ORAL | 3 refills | Status: DC

## 2018-07-09 NOTE — Progress Notes (Signed)
Patient is here today to follow up on her Left breast cancer. Patient stated that she had been doing well with no complaints. Patient stated that she had seen her general surgeon on 06/03/2018 and she does not need to see him until next year. Patient had her mammogram on 05/18/2018 and it was benign. Patient's last bone density was done on 09/23/2017. Patient stated that she is not taking her Fosamax because she's "lazy and hungry when she gets up".

## 2018-07-19 ENCOUNTER — Other Ambulatory Visit: Payer: Self-pay

## 2018-07-19 ENCOUNTER — Ambulatory Visit (INDEPENDENT_AMBULATORY_CARE_PROVIDER_SITE_OTHER): Payer: Medicare Other | Admitting: Podiatry

## 2018-07-19 ENCOUNTER — Encounter: Payer: Self-pay | Admitting: Podiatry

## 2018-07-19 DIAGNOSIS — E119 Type 2 diabetes mellitus without complications: Secondary | ICD-10-CM

## 2018-07-19 DIAGNOSIS — D689 Coagulation defect, unspecified: Secondary | ICD-10-CM

## 2018-07-19 DIAGNOSIS — Z89511 Acquired absence of right leg below knee: Secondary | ICD-10-CM

## 2018-07-19 DIAGNOSIS — B351 Tinea unguium: Secondary | ICD-10-CM | POA: Diagnosis not present

## 2018-07-19 DIAGNOSIS — M79676 Pain in unspecified toe(s): Secondary | ICD-10-CM | POA: Diagnosis not present

## 2018-07-19 DIAGNOSIS — Z89512 Acquired absence of left leg below knee: Secondary | ICD-10-CM

## 2018-07-19 NOTE — Progress Notes (Signed)
This patient presents the office for continued preventative foot care services on her right foot. She hasn't history of an amputation of the left leg due to bone cancer at 26.  Patient is a type II diabetic.  Patient is presently taking Xarelto  and Paxil.  Patient states that the nails have grown thick and long and are painful walking and wearing her shoes.  She presents the office today for preventative foot care services.     General Appearance  Alert, conversant and in no acute stress.  Vascular  Dorsalis pedis and posterior tibial  pulses are palpable  right..  Capillary return is within normal limits right foot.. Temperature is within normal limits  Right foot.  Neurologic  Senn-Weinstein monofilament wire test within normal limits  . Muscle power within normal limits .  Nails Thick disfigured discolored nails with subungual debris  from hallux to fifth toes right. No evidence of bacterial infection or drainage   Orthopedic  No limitations of motion of motion feet .  No crepitus or effusions noted.  No bony pathology or digital deformities noted. Amputation left leg.  Skin  normotropic skin with no porokeratosis noted right.  No signs of infections or ulcers noted.    Onychomycosis  Amputation left leg.  Diabetes.     Debridement and grinding of nails right foot.    RTC 3 months   Saraiah Bhat DPM 

## 2018-08-25 ENCOUNTER — Other Ambulatory Visit: Payer: Medicare Other

## 2018-09-13 ENCOUNTER — Ambulatory Visit (INDEPENDENT_AMBULATORY_CARE_PROVIDER_SITE_OTHER): Payer: Medicare Other | Admitting: Vascular Surgery

## 2018-10-12 ENCOUNTER — Telehealth: Payer: Self-pay | Admitting: Urology

## 2018-10-12 NOTE — Telephone Encounter (Signed)
Pt states that she is having bloody urine (heavy at times)  and has been seen at [facility] yesterday. She is needing a follow up appt with Dr Diamantina Providence.

## 2018-10-12 NOTE — Telephone Encounter (Signed)
Please schedule appointment.

## 2018-10-13 ENCOUNTER — Other Ambulatory Visit: Payer: Self-pay

## 2018-10-13 ENCOUNTER — Ambulatory Visit
Admission: RE | Admit: 2018-10-13 | Discharge: 2018-10-13 | Disposition: A | Discharge status: Home / Self Care | Payer: Medicare Other | Source: Ambulatory Visit | Attending: Oncology | Admitting: Oncology

## 2018-10-13 DIAGNOSIS — C50412 Malignant neoplasm of upper-outer quadrant of left female breast: Secondary | ICD-10-CM

## 2018-10-13 DIAGNOSIS — Z780 Asymptomatic menopausal state: Secondary | ICD-10-CM | POA: Insufficient documentation

## 2018-10-13 DIAGNOSIS — M8580 Other specified disorders of bone density and structure, unspecified site: Secondary | ICD-10-CM | POA: Diagnosis not present

## 2018-10-13 DIAGNOSIS — Z13820 Encounter for screening for osteoporosis: Secondary | ICD-10-CM | POA: Diagnosis not present

## 2018-10-18 ENCOUNTER — Other Ambulatory Visit: Payer: Self-pay

## 2018-10-18 ENCOUNTER — Ambulatory Visit (INDEPENDENT_AMBULATORY_CARE_PROVIDER_SITE_OTHER): Payer: Medicare Other | Admitting: Urology

## 2018-10-18 ENCOUNTER — Encounter: Payer: Self-pay | Admitting: Podiatry

## 2018-10-18 ENCOUNTER — Encounter: Payer: Self-pay | Admitting: Urology

## 2018-10-18 ENCOUNTER — Ambulatory Visit (INDEPENDENT_AMBULATORY_CARE_PROVIDER_SITE_OTHER): Payer: Medicare Other | Admitting: Podiatry

## 2018-10-18 VITALS — BP 130/65 | HR 86 | Ht 60.0 in | Wt 154.0 lb

## 2018-10-18 DIAGNOSIS — R310 Gross hematuria: Secondary | ICD-10-CM

## 2018-10-18 DIAGNOSIS — R319 Hematuria, unspecified: Secondary | ICD-10-CM

## 2018-10-18 DIAGNOSIS — Z89511 Acquired absence of right leg below knee: Secondary | ICD-10-CM | POA: Insufficient documentation

## 2018-10-18 DIAGNOSIS — B351 Tinea unguium: Secondary | ICD-10-CM | POA: Diagnosis not present

## 2018-10-18 DIAGNOSIS — M79676 Pain in unspecified toe(s): Secondary | ICD-10-CM | POA: Diagnosis not present

## 2018-10-18 DIAGNOSIS — Z89512 Acquired absence of left leg below knee: Secondary | ICD-10-CM

## 2018-10-18 DIAGNOSIS — M79674 Pain in right toe(s): Secondary | ICD-10-CM

## 2018-10-18 DIAGNOSIS — N390 Urinary tract infection, site not specified: Secondary | ICD-10-CM | POA: Diagnosis not present

## 2018-10-18 DIAGNOSIS — E119 Type 2 diabetes mellitus without complications: Secondary | ICD-10-CM

## 2018-10-18 MED ORDER — SULFAMETHOXAZOLE-TRIMETHOPRIM 800-160 MG PO TABS: 1.0000 | ORAL_TABLET | Freq: Two times a day (BID) | ORAL | 0 refills | Status: DC

## 2018-10-18 NOTE — Progress Notes (Signed)
   10/18/2018 9:07 AM   Dianah Field [birth year] [id number]  Reason for visit: Hematuria, history of stones  HPI: I saw Ms. Gannett back in urology clinic today for new onset of gross hematuria.  She has a number of comorbidities including left hemipelvectomy, DVT on Xarelto, and breast cancer.  She has a history of a negative gross hematuria work-up in October 2018 with Dr. Pilar Jarvis.  Cystoscopy was negative at that time.  CT urogram showed a right 8 mm renal pelvis stone and she had initially opted for active surveillance for this.  Follow-up [facility] for surveillance in October 2019 showed migration of the stone to the right distal ureter, and she ultimately underwent successful SWL.  She reports approximately 1 week of pink to red gross hematuria associated with urinary frequency last week.  Both of these symptoms have improved significantly and she denies any current gross hematuria, but mild residual urinary frequency.  She denies any fevers, flank pain, chills, or dysuria.  She has no history of carcinogenic exposures including is a never smoker and no exposures to aniline dyes or textiles.   ROS: Please see flowsheet from today's date for complete review of systems.  Physical Exam: BP 130/65   Pulse 86   Ht 5' (1.524 m)   Wt 154 lb (69.9 kg)   BMI 30.08 kg/m     Laboratory Data: Reviewed Urinalysis today 11-30 WBCs, 11-30 RBCs, few bacteria, no yeast, nitrite negative  Assessment & Plan:   In summary, the patient is a comorbid 85 year old female with ~ 1 week of gross hematuria associated with urinary frequency that has since resolved.  She has a history of a negative gross hematuria work-up and cystoscopy in October 2018, no carcinogenic risk factors.  Urinalysis today suggest possible UTI, will send for culture.  We discussed at length possible etiologies including UTI, recurrent stone disease, and malignancy.  We discussed the risks and benefits of repeating full hematuria  work-up with cystoscopy and CT urogram at length, versus short trial of antibiotics for possible UTI.  She elects for short trial of antibiotics for possible urinary tract infection, with the understanding that we would need to repeat her gross hematuria work-up if she were to have recurrent episodes of asymptomatic gross hematuria.  Bactrim DS x5 days, follow-up urine culture Virtual visit in 4 weeks for symptom check Low threshold to repeat CT urogram/cysto if recurrent gross hematuria  A total of 15 minutes were spent face-to-face with the patient, greater than 50% was spent in patient education, counseling, and coordination of care regarding hematuria, UTI, history of stone disease.   [place]ley [place], [facility] [address] [place], [place] [address] [phone]

## 2018-10-18 NOTE — Addendum Note (Signed)
Addended by: Verlene Mayer A on: 10/18/2018 11:01 AM   Modules accepted: Orders

## 2018-10-18 NOTE — Progress Notes (Signed)
This patient presents the office for continued preventative foot care services on her right foot. She hasn't history of an amputation of the left leg due to bone cancer at 26.  Patient is a type II diabetic.  Patient is presently taking Xarelto  and Paxil.  Patient states that the nails have grown thick and long and are painful walking and wearing her shoes.  She presents the office today for preventative foot care services.     General Appearance  Alert, conversant and in no acute stress.  Vascular  Dorsalis pedis and posterior tibial  pulses are palpable  right..  Capillary return is within normal limits right foot.. Temperature is within normal limits  Right foot.  Neurologic  Senn-Weinstein monofilament wire test within normal limits  . Muscle power within normal limits .  Nails Thick disfigured discolored nails with subungual debris  from hallux to fifth toes right. No evidence of bacterial infection or drainage   Orthopedic  No limitations of motion of motion feet .  No crepitus or effusions noted.  No bony pathology or digital deformities noted. Amputation left leg.  Skin  normotropic skin with no porokeratosis noted right.  No signs of infections or ulcers noted.    Onychomycosis  Amputation left leg.  Diabetes.     Debridement and grinding of nails right foot.    RTC 3 months   Julie Acosta DPM 

## 2018-10-18 NOTE — Patient Instructions (Signed)
Hematuria, Adult Hematuria is blood in the urine. Blood may be visible in the urine, or it may be identified with a test. This condition can be caused by infections of the bladder, urethra, kidney, or prostate. Other possible causes include:  Kidney stones.  Cancer of the urinary tract.  Too much calcium in the urine.  Conditions that are passed from parent to child (inherited conditions).  Exercise that requires a lot of energy. Infections can usually be treated with medicine, and a kidney stone usually will pass through your urine. If neither of these is the cause of your hematuria, more tests may be needed to identify the cause of your symptoms. It is very important to tell your health care provider about any blood in your urine, even if it is painless or the blood stops without treatment. Blood in the urine, when it happens and then stops and then happens again, can be a symptom of a very serious condition, including cancer. There is no pain in the initial stages of many urinary cancers. Follow these instructions at home: Medicines  Take over-the-counter and prescription medicines only as told by your health care provider.  If you were prescribed an antibiotic medicine, take it as told by your health care provider. Do not stop taking the antibiotic even if you start to feel better. Eating and drinking  Drink enough fluid to keep your urine clear or pale yellow. It is recommended that you drink 3-4 quarts (2.8-3.8 L) a day. If you have been diagnosed with an infection, it is recommended that you drink cranberry juice in addition to large amounts of water.  Avoid caffeine, tea, and carbonated beverages. These tend to irritate the bladder.  Avoid alcohol because it may irritate the prostate (men). General instructions  If you have been diagnosed with a kidney stone, follow your health care provider's instructions about straining your urine to catch the stone.  Empty your bladder  often. Avoid holding urine for long periods of time.  If you are female: ? After a bowel movement, wipe from front to back and use each piece of toilet paper only once. ? Empty your bladder before and after sex.  Pay attention to any changes in your symptoms. Tell your health care provider about any changes or any new symptoms.  It is your responsibility to get your test results. Ask your health care provider, or the department performing the test, when your results will be ready.  Keep all follow-up visits as told by your health care provider. This is important. Contact a health care provider if:  You develop back pain.  You have a fever.  You have nausea or vomiting.  Your symptoms do not improve after 3 days.  Your symptoms get worse. Get help right away if:  You develop severe vomiting and are unable take medicine without vomiting.  You develop severe pain in your back or abdomen even though you are taking medicine.  You pass a large amount of blood in your urine.  You pass blood clots in your urine.  You feel very weak or like you might faint.  You faint. Summary  Hematuria is blood in the urine. It has many possible causes.  It is very important that you tell your health care provider about any blood in your urine, even if it is painless or the blood stops without treatment.  Take over-the-counter and prescription medicines only as told by your health care provider.  Drink enough fluid to keep   your urine clear or pale yellow. This information is not intended to replace advice given to you by your health care provider. Make sure you discuss any questions you have with your health care provider. Document Released: 04/21/2005 Document Revised: 05/24/2016 Document Reviewed: 05/24/2016 Elsevier Interactive Patient Education  2019 [employer] Urinary Tract Infection, Adult A urinary tract infection (UTI) is an infection of any part of the urinary tract. The urinary  tract includes:  The kidneys.  The ureters.  The bladder.  The urethra. These organs make, store, and get rid of pee (urine) in the body. What are the causes? This is caused by germs (bacteria) in your genital area. These germs grow and cause swelling (inflammation) of your urinary tract. What increases the risk? You are more likely to develop this condition if:  You have a small, thin tube (catheter) to drain pee.  You cannot control when you pee or poop (incontinence).  You are female, and: ? You use these methods to prevent pregnancy: ? A medicine that kills sperm (spermicide). ? A device that blocks sperm (diaphragm). ? You have low levels of a female hormone (estrogen). ? You are pregnant.  You have genes that add to your risk.  You are sexually active.  You take antibiotic medicines.  You have trouble peeing because of: ? A prostate that is bigger than normal, if you are female. ? A blockage in the part of your body that drains pee from the bladder (urethra). ? A kidney stone. ? A nerve condition that affects your bladder (neurogenic bladder). ? Not getting enough to drink. ? Not peeing often enough.  You have other conditions, such as: ? Diabetes. ? A weak disease-fighting system (immune system). ? Sickle cell disease. ? Gout. ? Injury of the spine. What are the signs or symptoms? Symptoms of this condition include:  Needing to pee right away (urgently).  Peeing often.  Peeing small amounts often.  Pain or burning when peeing.  Blood in the pee.  Pee that smells bad or not like normal.  Trouble peeing.  Pee that is cloudy.  Fluid coming from the vagina, if you are female.  Pain in the belly or lower back. Other symptoms include:  Throwing up (vomiting).  No urge to eat.  Feeling mixed up (confused).  Being tired and grouchy (irritable).  A fever.  Watery poop (diarrhea). How is this treated? This condition may be treated with:   Antibiotic medicine.  Other medicines.  Drinking enough water. Follow these instructions at home:  Medicines  Take over-the-counter and prescription medicines only as told by your doctor.  If you were prescribed an antibiotic medicine, take it as told by your doctor. Do not stop taking it even if you start to feel better. General instructions  Make sure you: ? Pee until your bladder is empty. ? Do not hold pee for a long time. ? Empty your bladder after sex. ? Wipe from front to back after pooping if you are a female. Use each tissue one time when you wipe.  Drink enough fluid to keep your pee pale yellow.  Keep all follow-up visits as told by your doctor. This is important. Contact a doctor if:  You do not get better after 1-2 days.  Your symptoms go away and then come back. Get help right away if:  You have very bad back pain.  You have very bad pain in your lower belly.  You have a fever.  You are  sick to your stomach (nauseous).  You are throwing up. Summary  A urinary tract infection (UTI) is an infection of any part of the urinary tract.  This condition is caused by germs in your genital area.  There are many risk factors for a UTI. These include having a small, thin tube to drain pee and not being able to control when you pee or poop.  Treatment includes antibiotic medicines for germs.  Drink enough fluid to keep your pee pale yellow. This information is not intended to replace advice given to you by your health care provider. Make sure you discuss any questions you have with your health care provider. Document Released: 10/08/2007 Document Revised: 10/29/2017 Document Reviewed: 10/29/2017 Elsevier Interactive Patient Education  2019 [employer].

## 2018-10-20 LAB — URINALYSIS, COMPLETE
Bilirubin, UA: NEGATIVE
Nitrite, UA: NEGATIVE
Specific Gravity, UA: 1.025 (ref 1.005–1.030)
Urobilinogen, Ur: 0.2 mg/dL (ref 0.2–1.0)
pH, UA: 5 (ref 5.0–7.5)

## 2018-10-20 LAB — URINE CULTURE

## 2018-10-20 LAB — MICROSCOPIC EXAMINATION

## 2018-10-25 ENCOUNTER — Encounter (INDEPENDENT_AMBULATORY_CARE_PROVIDER_SITE_OTHER): Payer: Self-pay | Admitting: Vascular Surgery

## 2018-10-25 ENCOUNTER — Other Ambulatory Visit: Payer: Self-pay

## 2018-10-25 ENCOUNTER — Ambulatory Visit (INDEPENDENT_AMBULATORY_CARE_PROVIDER_SITE_OTHER): Payer: Medicare Other | Admitting: Vascular Surgery

## 2018-10-25 VITALS — BP 122/62 | HR 73 | Resp 12 | Ht 60.0 in | Wt 154.0 lb

## 2018-10-25 DIAGNOSIS — I87001 Postthrombotic syndrome without complications of right lower extremity: Secondary | ICD-10-CM | POA: Diagnosis not present

## 2018-10-25 DIAGNOSIS — I10 Essential (primary) hypertension: Secondary | ICD-10-CM | POA: Diagnosis not present

## 2018-10-25 DIAGNOSIS — E785 Hyperlipidemia, unspecified: Secondary | ICD-10-CM

## 2018-10-25 DIAGNOSIS — I87009 Postthrombotic syndrome without complications of unspecified extremity: Secondary | ICD-10-CM

## 2018-10-25 DIAGNOSIS — E119 Type 2 diabetes mellitus without complications: Secondary | ICD-10-CM

## 2018-10-25 DIAGNOSIS — Z7984 Long term (current) use of oral hypoglycemic drugs: Secondary | ICD-10-CM

## 2018-10-25 DIAGNOSIS — Z79899 Other long term (current) drug therapy: Secondary | ICD-10-CM

## 2018-10-25 DIAGNOSIS — Z7901 Long term (current) use of anticoagulants: Secondary | ICD-10-CM

## 2018-10-25 NOTE — Progress Notes (Signed)
MRN : [id number]  Julie Acosta is a 85 y.o. (16-Oct-1933) female who presents with chief complaint of  Chief Complaint  Patient presents with   Follow-up  .  History of Present Illness:   The patient presents to the office for follow up evaluation of DVT. DVT was identified at [facility] remotely. The initial symptoms were pain and swelling in the lower extremity.  The patient notes the leg continues to be painful with dependency and swells a bite. Symptoms are much better with elevation. The patient notes minimal edema in the morning which steadily worsens throughout the day.   She is not having any hematuria at this time.   The patient has been using compression therapy at this point.  No SOB or pleuritic chest pains. No cough or hemoptysis.  No blood per rectumbut she did have significant hematuria.  Workup did not reveal a source and she did not have to stop her Xarelto.  At this time the hematuria has mostly resolved.  She is still having 4+ loose stools per day mostly in the AM and this is her typical pattern. Noblood in any sputum. No excessive bruising per the patient.  previous ABI's Rt=1.04 and the Lt=AKA  Current Meds  Medication Sig   acetaminophen (TYLENOL) 500 MG tablet Take 500 mg by mouth every 6 (six) hours as needed (for pain/headaches.).   albuterol (PROVENTIL) (2.5 MG/3ML) 0.083% nebulizer solution Take 2.5 mg by nebulization every 6 (six) hours as needed for wheezing or shortness of breath.   fenofibrate 160 MG tablet Take 160 mg by mouth daily.    GLIPIZIDE XL 2.5 MG 24 hr tablet Take 2.5 mg by mouth daily.    losartan (COZAAR) 50 MG tablet Take 50 mg by mouth daily.    metFORMIN (GLUCOPHAGE) 500 MG tablet Take 1,000 mg by mouth 2 (two) times daily.    metoprolol succinate (TOPROL-XL) 25 MG 24 hr tablet Take 12.5 mg by mouth at bedtime.    omeprazole (PRILOSEC) 20 MG capsule Take 20 mg by mouth daily.   PARoxetine (PAXIL) 40 MG tablet  Take 40 mg by mouth daily.    raloxifene (EVISTA) 60 MG tablet Take 60 mg by mouth daily.    rivaroxaban (XARELTO) 20 MG TABS tablet Take 20 mg by mouth daily with supper.   sulfamethoxazole-trimethoprim (BACTRIM DS) 800-160 MG tablet Take 1 tablet by mouth 2 (two) times daily.    Past Medical History:  Diagnosis Date   Anxiety    Benign breast lumps    Blood clotting disorder (HCC)    Bone cancer ([place])    Breast cancer of upper-outer quadrant of left female breast ([place]) 07/27/2017   11 mm invasive mammary carcinoma, ER 90%, PR 51-90%, negative margins.  Oncotype recurrence score: 1.  DCIS, margin less than 0.5 mm.  Negative sentinel node.  Accelerated partial breast radiation.   Cervicalgia    Chronic kidney disease    kidney stones   Colon cancer ([place])    Complication of anesthesia    MOOD ALTERATION / UNSURE IF ANES OR PAIN MED. NO TROUBLE WITH MOH'S   Cough    NASAL DRIP / SNEEZING / SORE THROAT MOSTLY CONSTANT   Depression    Diabetes (HCC)    Diverticulitis    Dizzy    GERD (gastroesophageal reflux disease)    HBP (high blood pressure)    Hematuria    gross   Hemorrhoids    HLD (hyperlipidemia)  Muscle pain    Osteoarthritis    Osteoporosis    Panic disorder    PND (post-nasal drip)    CHRONIC WITH SORE THROAT AND SNEEZING   Reflux    Skin cancer    nose   Swelling    Tremors of nervous system     Past Surgical History:  Procedure Laterality Date   APPENDECTOMY  1962   BREAST BIOPSY Right 2006?   benign   BREAST CYST EXCISION Left 07/27/2017   Procedure: SKIN CYST EXCISED;  Surgeon: Robert Bellow, MD;  Location: [facility] ORS;  Service: General;  Laterality: Left;   BREAST LUMPECTOMY Left 07/2017   invasive mammary, DCIS  mammosite   BREAST SURGERY Right 2019   CARPAL TUNNEL RELEASE Right    CATARACT EXTRACTION W/PHACO Left 11/11/2016   Procedure: CATARACT EXTRACTION PHACO AND INTRAOCULAR LENS PLACEMENT ([place]);   Surgeon: Birder Robson, MD;  Location: [facility] ORS;  Service: Ophthalmology;  Laterality: Left;  [place] 01:07.9 AP% 19.2 CDE 13.02 Fluid pack lot # [id number]   CATARACT EXTRACTION W/PHACO Right 12/09/2016   Procedure: CATARACT EXTRACTION PHACO AND INTRAOCULAR LENS PLACEMENT (IOC);  Surgeon: Birder Robson, MD;  Location: [facility] ORS;  Service: Ophthalmology;  Laterality: Right;  [place] 00:49 AP% 21.2 CDE 10.39 Fluid pack lot # [id number]   CHOLECYSTECTOMY     COLONOSCOPY WITH PROPOFOL N/A 11/22/2014   Procedure: COLONOSCOPY WITH PROPOFOL;  Surgeon: Manya Silvas, MD;  Location[facility] ENDOSCOPY;  Service: Endoscopy;  Laterality: N/A;   ESOPHAGOGASTRODUODENOSCOPY  11/22/2014   Procedure: ESOPHAGOGASTRODUODENOSCOPY (EGD);  Surgeon: Manya Silvas, MD;  Location: [facility] ENDOSCOPY;  Service: Endoscopy;;   ESOPHAGOGASTRODUODENOSCOPY (EGD) WITH PROPOFOL N/A 03/05/2017   Procedure: ESOPHAGOGASTRODUODENOSCOPY (EGD) WITH PROPOFOL;  Surgeon: Lin Landsman, MD;  Location[facility] ENDOSCOPY;  Service: Gastroenterology;  Laterality: N/A;   EXTRACORPOREAL SHOCK WAVE LITHOTRIPSY Right 03/04/2018   Procedure: EXTRACORPOREAL SHOCK WAVE LITHOTRIPSY (ESWL);  Surgeon: Billey Co, MD;  Location: [facility] ORS;  Service: Urology;  Laterality: Right;   EYE SURGERY  2012   cataract extraction with iol   GALLBLADDER SURGERY  1968   hemi pelvectomy     HEMIPELVIC Left 1962   LEG SURGERY Left    AMPUTATION   MASTECTOMY, PARTIAL Left 07/27/2017   Procedure: MASTECTOMY PARTIAL;  Surgeon: Robert Bellow, MD;  Location: [facility] ORS;  Service: General;  Laterality: Left;   MOUTH SURGERY  2019   OVARY SURGERY Right    SAVORY DILATION  11/22/2014   Procedure: SAVORY DILATION;  Surgeon: Manya Silvas, MD;  Location[facility] ENDOSCOPY;  Service: Endoscopy;;   SENTINEL NODE BIOPSY Left 07/27/2017   Procedure: SENTINEL NODE BIOPSY;  Surgeon: Robert Bellow, MD;  Location: [facility] ORS;  Service: General;  Laterality:  Left;   SKIN CANCER EXCISION     TONSILLECTOMY      Social History Social History   Tobacco Use   Smoking status: Never Smoker   Smokeless tobacco: Never Used  Substance Use Topics   Alcohol use: No   Drug use: No    Family History Family History  Problem Relation Age of Onset   Breast cancer Paternal Aunt    Ovarian cancer Sister    Prostate cancer Father    Stroke Mother    Kidney cancer Neg Hx    Bladder Cancer Neg Hx     Allergies  Allergen Reactions   Ciprofloxacin Hives   Codeine Other (See Comments)    HALLUCINATIONS   Tape  Rash and skin irritation/ paper tape and tegaderm OK   Ciprocinonide [Fluocinolone] Other (See Comments)    unknown   Amoxicillin Other (See Comments)    Upset stomach Has patient had a PCN reaction causing immediate rash, facial/tongue/throat swelling, SOB or lightheadedness with hypotension: No Has patient had a PCN reaction causing severe rash involving mucus membranes or skin necrosis: No Has patient had a PCN reaction that required hospitalization: No Has patient had a PCN reaction occurring within the last 10 years: Yes If all of the above answers are "NO", then may proceed with Cephalosporin use.;   Cefuroxime Other (See Comments)    OK INTRACAMERALLY PER DR WLP, upset stomach   Lipitor [Atorvastatin] Other (See Comments)    unknown     REVIEW OF SYSTEMS (Negative unless checked)  Constitutional: [] Weight loss  [] Fever  [] Chills Cardiac: [] Chest pain   [] Chest pressure   [] Palpitations   [] Shortness of breath when laying flat   [] Shortness of breath with exertion. Vascular:  [] Pain in legs with walking   [x] Pain in leg at rest  [] History of DVT   [] Phlebitis   [x] Swelling in legs   [] Varicose veins   [] Non-healing ulcers Pulmonary:   [] Uses home oxygen   [] Productive cough   [] Hemoptysis   [] Wheeze  [] COPD   [] Asthma Neurologic:  [] Dizziness   [] Seizures   [] History of stroke   [] History of TIA  [] Aphasia    [] Vissual changes   [] Weakness or numbness in arm   [] Weakness or numbness in leg Musculoskeletal:   [] Joint swelling   [] Joint pain   [] Low back pain Hematologic:  [] Easy bruising  [] Easy bleeding   [] Hypercoagulable state   [] Anemic Gastrointestinal:  [x] Diarrhea   [] Vomiting  [] Gastroesophageal reflux/heartburn   [] Difficulty swallowing. Genitourinary:  [] Chronic kidney disease   [] Difficult urination  [] Frequent urination   [x] Blood in urine Skin:  [] Rashes   [] Ulcers  Psychological:  [] History of anxiety   []  History of major depression.  Physical Examination  Vitals:   10/25/18 1304  BP: 122/62  Pulse: 73  Resp: 12  Weight: 154 lb (69.9 kg)  Height: 5' (1.524 m)   Body mass index is 30.08 kg/m. Gen: WD/WN, NAD seen in wheelchair Head: [place]/AT, No temporalis wasting.  Ear/Nose/Throat: Hearing grossly intact, nares w/o erythema or drainage Eyes: PER, EOMI, sclera nonicteric.  Neck: Supple, no large masses.   Pulmonary:  Good air movement, no audible wheezing bilaterally, no use of accessory muscles.  Cardiac: RRR, no JVD Vascular: scattered varicosities present right.  Mild venous stasis changes to the leg right.  2+ soft pitting edema right leg Vessel Right Left  Radial Palpable Palpable  Ulnar Palpable Palpable  Brachial Palpable Palpable  Carotid Palpable Palpable  Popliteal Palpable AKA  PT Not Palpable AKA  DP Not Palpable AKA  Gastrointestinal: Non-distended. No guarding/no peritoneal signs.  Musculoskeletal: M/S 5/5 throughout.  No deformity or atrophy.  Neurologic: CN 2-12 intact. Symmetrical.  Speech is fluent. Motor exam as listed above. Psychiatric: Judgment intact, Mood & affect appropriate for pt's clinical situation. Dermatologic: No rashes or ulcers noted.  No changes consistent with cellulitis. Lymph : No lichenification or skin changes of chronic lymphedema.  CBC Lab Results  Component Value Date   WBC 11.4 (H) 10/02/2017   HGB 13.6 10/02/2017    HCT 40.8 10/02/2017   MCV 91.1 10/02/2017   PLT 150 10/02/2017    BMET    Component Value Date/Time   NA 133 (L) 10/02/2017  1157   NA 139 07/01/2013 1721   K 4.3 10/02/2017 1157   K 4.6 07/01/2013 1721   CL 101 10/02/2017 1157   CL 108 (H) 07/01/2013 1721   CO2 22 10/02/2017 1157   CO2 23 07/01/2013 1721   GLUCOSE 270 (H) 10/02/2017 1157   GLUCOSE 150 (H) 07/01/2013 1721   BUN 23 (H) 10/02/2017 1157   BUN 23 01/20/2017 1134   BUN 21 (H) 07/01/2013 1721   CREATININE 0.79 10/02/2017 1157   CREATININE 0.95 07/01/2013 1721   CALCIUM 9.7 10/02/2017 1157   CALCIUM 11.2 (H) 07/01/2013 1721   GFRNONAA >60 10/02/2017 1157   GFRNONAA 58 (L) 07/01/2013 1721   GFRAA >60 10/02/2017 1157   GFRAA >60 07/01/2013 1721   CrCl cannot be calculated (Patient's most recent lab result is older than the maximum 21 days allowed.).  COAG No results found for: INR, PROTIME  Radiology Dg Bone Density  Result Date: 10/13/2018 EXAM: DUAL X-RAY ABSORPTIOMETRY (DXA) FOR BONE MINERAL DENSITY IMPRESSION: Technologist:jbh Your patient Julie Acosta completed a BMD test on 10/13/2018 using the [place] (analysis version: 14.10) manufactured by [employer]. The following summarizes the results of our evaluation. PATIENT BIOGRAPHICAL: Name: Julie Acosta, Julie Acosta Patient ID: [id number] Birth Date: 1933/11/30 Height: 60.0 in. Gender: Female Exam Date: 10/13/2018 Weight: 154.0 lbs. Indications: Advanced Age, Caucasian, Diabetic, History of Breast Cancer, Parent Hip Fracture, Postmenopausal Fractures: Treatments: Evista, Femara, Glipizide, Metformin ASSESSMENT: The BMD measured at Femur Total is 0.742 g/cm2 with a T-score of -2.1. This patient is considered osteopenic according to [facility]) criteria. Patient is not a candidate for FRAX due to Evista. Left femur was excluded due to amputation and hemipelvectomy. The quality of the exam is good. Site Region Measured Measured WHO Young Adult  BMD Date       Age      Classification T-score AP Spine L1-L4 10/13/2018 82.4 Osteopenia -2.0 0.941 g/cm2 AP Spine L1-L4 09/23/2017 81.4 Osteopenia -1.9 0.955 g/cm2 Right Femur Total 10/13/2018 82.4 Osteopenia -2.1 0.742 g/cm2 Right Femur Total 09/23/2017 81.4 Osteopenia -2.4 0.701 g/cm2 World Health Organization [facility]) criteria for post-menopausal, Caucasian Women: Normal:       T-score at or above -1 SD Osteopenia:   T-score between -1 and -2.5 SD Osteoporosis: T-score at or below -2.5 SD RECOMMENDATIONS: 1. All patients should optimize calcium and vitamin D intake. 2. Consider FDA-approved medical therapies in postmenopausal women and men aged 71 years and older, based on the following: a. A hip or vertebral(clinical or morphometric) fracture b. T-score < -2.5 at the femoral neck or spine after appropriate evaluation to exclude secondary causes c. Low bone mass (T-score between -1.0 and -2.5 at the femoral neck or spine) and a 10-year probability of a hip fracture > 3% or a 10-year probability of a major osteoporosis-related fracture > 20% based on the [place]-adapted WHO algorithm d. Clinician judgment and/or patient preferences may indicate treatment for people with 10-year fracture probabilities above or below these levels FOLLOW-UP: People with diagnosed cases of osteoporosis or at high risk for fracture should have regular bone mineral density tests. For patients eligible for Medicare, routine testing is allowed once every 2 years. The testing frequency can be increased to one year for patients who have rapidly progressing disease, those who are receiving or discontinuing medical therapy to restore bone mass, or have additional risk factors. I have reviewed this report, and agree with the above findings. [facility] Radiology Electronically Signed   By:  Lowella Grip III M.D.   On: 10/13/2018 11:59     Assessment/Plan 1. Post-phlebitic syndrome Recommend:   No surgery or intervention at this point in  time.  IVC filter is not indicated at present.  Patient's duplex ultrasound of the venous system shows DVT from the popliteal to the femoral veins.  The patient is initiated on anticoagulation   Elevation was stressed, use of a recliner was discussed.  I have had a long discussion with the patient regarding DVT and post phlebitic changes such as swelling and why it causes symptoms such as pain. The patient will wear graduated compression stockings class 1 (20-30 mmHg), beginning after three full days of anticoagulation, on a daily basis a prescription was given. The patient will beginning wearing the stockings first thing in the morning and removing them in the evening. The patient is instructed specifically not to sleep in the stockings. In addition, behavioral modification including elevation during the day and avoidance of prolonged dependency will be initiated.   The patient will continue anticoagulation for now as there have not been any problems or complications at this point.  2. Type 2 diabetes mellitus without complication, unspecified whether long term insulin use ([place]) Continue hypoglycemic medications as already ordered, these medications have been reviewed and there are no changes at this time.  Hgb A1C to be monitored as already arranged by primary service   3. Hyperlipidemia, unspecified hyperlipidemia type Continue statin as ordered and reviewed, no changes at this time   4. Essential hypertension Continue antihypertensive medications as already ordered, these medications have been reviewed and there are no changes at this time.     Hortencia Pilar, MD  10/25/2018 1:12 PM

## 2018-10-27 ENCOUNTER — Encounter (INDEPENDENT_AMBULATORY_CARE_PROVIDER_SITE_OTHER): Payer: Self-pay | Admitting: Vascular Surgery

## 2018-11-16 ENCOUNTER — Other Ambulatory Visit: Payer: Self-pay

## 2018-11-16 ENCOUNTER — Telehealth (INDEPENDENT_AMBULATORY_CARE_PROVIDER_SITE_OTHER): Payer: Medicare Other | Admitting: Urology

## 2018-11-16 ENCOUNTER — Telehealth: Payer: Self-pay | Admitting: Urology

## 2018-11-16 DIAGNOSIS — R310 Gross hematuria: Secondary | ICD-10-CM | POA: Diagnosis not present

## 2018-11-16 NOTE — Progress Notes (Signed)
Virtual Visit via Telephone Note  I connected with Julie Acosta on 11/16/18 at 11:30 AM EDT by telephone and verified that I am speaking with the correct person using two identifiers.   I discussed the limitations, risks, security and privacy concerns of performing an evaluation and management service by telephone and the availability of in person appointments. We discussed the impact of the COVID-19 pandemic on the healthcare system, and the importance of social distancing and reducing patient and provider exposure. I also discussed with the patient that there may be a patient responsible charge related to this service. The patient expressed understanding and agreed to proceed.  Reason for visit: Gross hematuria  History of Present Illness: I had a virtual phone visit with [name] today.  She is an 85 year old very comorbid female with history of left hemipelvectomy, DVT on Xarelto, breast cancer, and shockwave lithotripsy in October 2019 for a right ureteral stone with negative KUB 2 weeks post procedure who presents with ongoing asymptomatic gross hematuria.  I saw her for similar complaint in mid June 2020 and she was also having urgency and frequency at that time, and urinalysis was concerning for possible UTI.  Urine culture ultimately showed less than 10K bacteria, and she completed a 5-day course of Bactrim.  She reports her hematuria resolves, however it has resumed over the last week.  She denies any pain, dysuria, urgency, or frequency.  She has no history of carcinogenic exposures, and is a never smoker.  She underwent a hematuria work-up in October 2018 with Dr. Pilar Jarvis, which was negative.  Assessment and Plan: In summary, [name] is an 85 year old co-morbid female with history of right shockwave lithotripsy in the fall 2019 who presents with ongoing asymptomatic gross hematuria.  We discussed common possible etiologies of hematuria including malignancy, urolithiasis, medical  renal disease, and idiopathic. Standard workup recommended by the AUA includes imaging with CT urogram to assess the upper tracts, and cystoscopy. Cytology is performed on patient's with gross hematuria to look for malignant cells in the urine.  Follow Up:   RTC for CT urogram and cystoscopy   I discussed the assessment and treatment plan with the patient. The patient was provided an opportunity to ask questions and all were answered. The patient agreed with the plan and demonstrated an understanding of the instructions.   The patient was advised to call back or seek an in-person evaluation if the symptoms worsen or if the condition fails to improve as anticipated.  I provided 15 minutes of non-face-to-face time during this encounter.   Billey Co, MD

## 2018-11-16 NOTE — Telephone Encounter (Signed)
App made patient in aware

## 2018-11-16 NOTE — Telephone Encounter (Signed)
-----   Message from Billey Co, MD sent at 11/16/2018 12:14 PM EDT ----- Regarding: follow up cysto Please schedule clinic cystoscopy in 3-4 weeks, CT ordered and will need to be done before clinic cysto.  Nickolas Madrid, MD 11/16/2018

## 2018-11-25 ENCOUNTER — Ambulatory Visit
Admission: RE | Admit: 2018-11-25 | Discharge: 2018-11-25 | Disposition: A | Discharge status: Home / Self Care | Payer: Medicare Other | Source: Ambulatory Visit | Attending: Urology | Admitting: Urology

## 2018-11-25 ENCOUNTER — Other Ambulatory Visit: Payer: Self-pay

## 2018-11-25 DIAGNOSIS — R310 Gross hematuria: Secondary | ICD-10-CM

## 2018-11-25 LAB — POCT I-STAT CREATININE: Creatinine, Ser: 0.7 mg/dL (ref 0.44–1.00)

## 2018-11-25 MED ORDER — IOHEXOL 300 MG/ML  SOLN
150.0000 mL | Freq: Once | INTRAMUSCULAR | Status: AC | PRN
  Administered 2018-11-25: 10:00:00 125 mL via INTRAVENOUS

## 2018-11-26 ENCOUNTER — Observation Stay
Admission: RE | Admit: 2018-11-26 | Discharge: 2018-11-27 | Disposition: A | Discharge status: Home / Self Care | Payer: Medicare Other | Source: Skilled Nursing Facility | Attending: Urology | Admitting: Urology

## 2018-11-26 ENCOUNTER — Observation Stay
Admission: RE | Admit: 2018-11-26 | Discharge: 2018-11-26 | Disposition: A | Discharge status: Home / Self Care | Payer: Medicare Other | Source: Skilled Nursing Facility | Attending: Urology | Admitting: Urology

## 2018-11-26 ENCOUNTER — Ambulatory Visit (INDEPENDENT_AMBULATORY_CARE_PROVIDER_SITE_OTHER): Payer: Medicare Other | Admitting: Physician Assistant

## 2018-11-26 ENCOUNTER — Other Ambulatory Visit: Payer: Self-pay | Admitting: Radiology

## 2018-11-26 ENCOUNTER — Other Ambulatory Visit: Payer: Self-pay

## 2018-11-26 ENCOUNTER — Encounter: Payer: Self-pay | Admitting: Physician Assistant

## 2018-11-26 DIAGNOSIS — N200 Calculus of kidney: Secondary | ICD-10-CM

## 2018-11-26 DIAGNOSIS — E785 Hyperlipidemia, unspecified: Secondary | ICD-10-CM | POA: Diagnosis not present

## 2018-11-26 DIAGNOSIS — Z853 Personal history of malignant neoplasm of breast: Secondary | ICD-10-CM | POA: Diagnosis not present

## 2018-11-26 DIAGNOSIS — Z7901 Long term (current) use of anticoagulants: Secondary | ICD-10-CM | POA: Insufficient documentation

## 2018-11-26 DIAGNOSIS — M1990 Unspecified osteoarthritis, unspecified site: Secondary | ICD-10-CM | POA: Diagnosis not present

## 2018-11-26 DIAGNOSIS — Z85038 Personal history of other malignant neoplasm of large intestine: Secondary | ICD-10-CM | POA: Insufficient documentation

## 2018-11-26 DIAGNOSIS — N132 Hydronephrosis with renal and ureteral calculous obstruction: Principal | ICD-10-CM | POA: Diagnosis present

## 2018-11-26 DIAGNOSIS — D689 Coagulation defect, unspecified: Secondary | ICD-10-CM | POA: Diagnosis not present

## 2018-11-26 DIAGNOSIS — E1122 Type 2 diabetes mellitus with diabetic chronic kidney disease: Secondary | ICD-10-CM | POA: Diagnosis not present

## 2018-11-26 DIAGNOSIS — Z7984 Long term (current) use of oral hypoglycemic drugs: Secondary | ICD-10-CM | POA: Diagnosis not present

## 2018-11-26 DIAGNOSIS — Z79899 Other long term (current) drug therapy: Secondary | ICD-10-CM | POA: Diagnosis not present

## 2018-11-26 DIAGNOSIS — K219 Gastro-esophageal reflux disease without esophagitis: Secondary | ICD-10-CM | POA: Diagnosis not present

## 2018-11-26 DIAGNOSIS — I129 Hypertensive chronic kidney disease with stage 1 through stage 4 chronic kidney disease, or unspecified chronic kidney disease: Secondary | ICD-10-CM | POA: Diagnosis not present

## 2018-11-26 DIAGNOSIS — M810 Age-related osteoporosis without current pathological fracture: Secondary | ICD-10-CM | POA: Diagnosis not present

## 2018-11-26 DIAGNOSIS — N189 Chronic kidney disease, unspecified: Secondary | ICD-10-CM | POA: Insufficient documentation

## 2018-11-26 DIAGNOSIS — Z20828 Contact with and (suspected) exposure to other viral communicable diseases: Secondary | ICD-10-CM | POA: Diagnosis not present

## 2018-11-26 DIAGNOSIS — F419 Anxiety disorder, unspecified: Secondary | ICD-10-CM | POA: Diagnosis not present

## 2018-11-26 DIAGNOSIS — Z85828 Personal history of other malignant neoplasm of skin: Secondary | ICD-10-CM | POA: Diagnosis not present

## 2018-11-26 DIAGNOSIS — F329 Major depressive disorder, single episode, unspecified: Secondary | ICD-10-CM | POA: Diagnosis not present

## 2018-11-26 DIAGNOSIS — Z923 Personal history of irradiation: Secondary | ICD-10-CM | POA: Insufficient documentation

## 2018-11-26 HISTORY — DX: Hydronephrosis with renal and ureteral calculous obstruction: N13.2

## 2018-11-26 LAB — GLUCOSE, CAPILLARY
Glucose-Capillary: 106 mg/dL — ABNORMAL HIGH (ref 70–99)
Glucose-Capillary: 186 mg/dL — ABNORMAL HIGH (ref 70–99)

## 2018-11-26 LAB — URINALYSIS, COMPLETE
Bilirubin, UA: NEGATIVE
Glucose, UA: NEGATIVE
Nitrite, UA: POSITIVE — AB
Specific Gravity, UA: 1.025 (ref 1.005–1.030)
Urobilinogen, Ur: 0.2 mg/dL (ref 0.2–1.0)
pH, UA: 5 (ref 5.0–7.5)

## 2018-11-26 LAB — SARS CORONAVIRUS 2 BY RT PCR (HOSPITAL ORDER, PERFORMED IN ~~LOC~~ HOSPITAL LAB): SARS Coronavirus 2: NEGATIVE

## 2018-11-26 LAB — MRSA PCR SCREENING: MRSA by PCR: NEGATIVE

## 2018-11-26 LAB — MICROSCOPIC EXAMINATION: RBC: >30 /hpf — AB (ref 0–2)

## 2018-11-26 MED ORDER — SODIUM CHLORIDE 0.9 % IV SOLN: INTRAVENOUS | Status: DC | PRN

## 2018-11-26 MED ORDER — SODIUM CHLORIDE 0.9 % IV SOLN
2.0000 g | INTRAVENOUS | Status: DC
  Administered 2018-11-26: 2 g via INTRAVENOUS
  Filled 2018-11-26: qty 2
  Filled 2018-11-26: qty 20

## 2018-11-26 MED ORDER — SODIUM CHLORIDE 0.9 % IV SOLN
2.0000 g | INTRAVENOUS | Status: AC
  Administered 2018-11-27: 2 g via INTRAVENOUS
  Filled 2018-11-26: qty 20

## 2018-11-26 MED ORDER — PANTOPRAZOLE SODIUM 40 MG PO TBEC
40.0000 mg | DELAYED_RELEASE_TABLET | Freq: Every day | ORAL | Status: DC
  Administered 2018-11-26 – 2018-11-27 (×2): 40 mg via ORAL
  Filled 2018-11-26 (×2): qty 1

## 2018-11-26 MED ORDER — SODIUM CHLORIDE 0.9% FLUSH
3.0000 mL | Freq: Two times a day (BID) | INTRAVENOUS | Status: DC
  Administered 2018-11-26 – 2018-11-27 (×2): 3 mL via INTRAVENOUS

## 2018-11-26 MED ORDER — ACETAMINOPHEN 500 MG PO TABS
500.0000 mg | ORAL_TABLET | Freq: Four times a day (QID) | ORAL | Status: DC | PRN
  Administered 2018-11-26: 500 mg via ORAL
  Filled 2018-11-26: qty 1

## 2018-11-26 MED ORDER — INSULIN ASPART 100 UNIT/ML ~~LOC~~ SOLN
0.0000 [IU] | Freq: Three times a day (TID) | SUBCUTANEOUS | Status: DC
  Administered 2018-11-27: 1 [IU] via SUBCUTANEOUS
  Administered 2018-11-27: 2 [IU] via SUBCUTANEOUS
  Filled 2018-11-26 (×2): qty 1

## 2018-11-26 MED ORDER — METOPROLOL SUCCINATE ER 25 MG PO TB24
12.5000 mg | ORAL_TABLET | Freq: Every day | ORAL | Status: DC
  Administered 2018-11-26: 12.5 mg via ORAL
  Filled 2018-11-26: qty 1

## 2018-11-26 MED ORDER — METFORMIN HCL 500 MG PO TABS
1000.0000 mg | ORAL_TABLET | Freq: Two times a day (BID) | ORAL | Status: DC
  Administered 2018-11-26: 1000 mg via ORAL
  Filled 2018-11-26: qty 2

## 2018-11-26 MED ORDER — METFORMIN HCL 500 MG PO TABS: 1000.0000 mg | ORAL_TABLET | Freq: Every day | ORAL | Status: DC

## 2018-11-26 MED ORDER — SODIUM CHLORIDE 0.9 % IV SOLN
2.0000 g | INTRAVENOUS | Status: DC
  Filled 2018-11-26: qty 20

## 2018-11-26 MED ORDER — SODIUM CHLORIDE 0.9% FLUSH: 3.0000 mL | Freq: Two times a day (BID) | INTRAVENOUS | Status: DC

## 2018-11-26 NOTE — Progress Notes (Addendum)
Pt requested to resume her night time medication listed: metoprolol succinate, Prilosec, and metformin tonight. Pt also states that her last dose of xarelto was 11/25/2018 at noon. Talked to Dr. Junious Silk and ordered to resumed the mentioned medicine above and made aware of last dose of xarelto taken by pt. Talked to pharmacy and states that we have protonix in replacement of prilosec as per pharmacy. Pt was notified. Will continue to monitor.  Update 2148: Pt complaining of 6 out 10 back pain. Notify Dr. Junious Silk. Will continue to monitor.  Update 2159: Talked to Dr. Junious Silk and ordered tylenol 500 mg 1 tablet oral every 6 hours as needed. Will continue to monitor.

## 2018-11-26 NOTE — Patient Instructions (Signed)
Ureteral Stent Implantation  Ureteral stent implantation is a procedure to insert (implant) a flexible, soft, plastic tube (stent) into a ureter. Ureters are the tube-like parts of the body that drain urine from the kidneys. The stent supports the ureter while it heals and helps to drain urine. You may have a ureteral stent implanted after having a procedure to remove a blockage from the ureter (ureterolysis or pyeloplasty). You may also have a stent implanted to open the flow of urine when you have a blockage caused by a kidney stone, tumor, blood clot, or infection. You have two ureters, one on each side of the body. The ureters connect the kidneys to the organ that holds urine until it passes out of the body (bladder). The stent is placed so that one end is in the kidney, and one end is in the bladder. The stent is usually taken out after your ureter has healed. Depending on your condition, you may have a stent for just a few weeks, or you may have a long-term stent that will need to be replaced every few months. Tell a health care provider about:  Any allergies you have.  All medicines you are taking, including vitamins, herbs, eye drops, creams, and over-the-counter medicines.  Any problems you or family members have had with anesthetic medicines.  Any blood disorders you have.  Any surgeries you have had.  Any medical conditions you have.  Whether you are pregnant or may be pregnant. What are the risks? Generally, this is a safe procedure. However, problems may occur, including:  Infection.  Bleeding.  Allergic reactions to medicines.  Damage to other structures or organs. Tearing (perforation) of the ureter is possible.  Movement of the stent away from where it is placed during surgery (migration). What happens before the procedure? Medicines Ask your health care provider about:  Changing or stopping your regular medicines. This is especially important if you are taking  diabetes medicines or blood thinners.  Taking medicines such as aspirin and ibuprofen. These medicines can thin your blood. Do not take these medicines unless your health care provider tells you to take them.  Taking over-the-counter medicines, vitamins, herbs, and supplements. Eating and drinking Follow instructions from your health care provider about eating and drinking, which may include:  8 hours before the procedure - stop eating heavy meals or foods, such as meat, fried foods, or fatty foods.  6 hours before the procedure - stop eating light meals or foods, such as toast or cereal.  6 hours before the procedure - stop drinking milk or drinks that contain milk.  2 hours before the procedure - stop drinking clear liquids. Staying hydrated Follow instructions from your health care provider about hydration, which may include:  Up to 2 hours before the procedure - you may continue to drink clear liquids, such as water, clear fruit juice, black coffee, and plain tea. General instructions  Do not drink alcohol.  Do not use any products that contain nicotine or tobacco for at least 4 weeks before the procedure. These products include cigarettes, e-cigarettes, and chewing tobacco. If you need help quitting, ask your health care provider.  You may have an exam or testing, such as imaging or blood tests.  Ask your health care provider what steps will be taken to help prevent infection. These may include: ? Removing hair at the surgery site. ? Washing skin with a germ-killing soap. ? Taking antibiotic medicine.  Plan to have someone take you home   from the hospital or clinic.  If you will be going home right after the procedure, plan to have someone with you for 24 hours. What happens during the procedure?  An IV will be inserted into one of your veins.  You may be given a medicine to help you relax (sedative).  You may be given a medicine to make you fall asleep (general  anesthetic).  A thin, tube-shaped instrument with a light and tiny camera at the end (cystoscope) will be inserted into your urethra. The urethra is the tube that drains urine from the bladder out of the body. In men, the urethra opens at the end of the penis. In women, the urethra opens in front of the vaginal opening.  The cystoscope will be passed into your bladder.  A thin wire (guide wire) will be passed through your bladder and into your ureter. This is used to guide the stent into your ureter.  The stent will be inserted into your ureter.  The guide wire and the cystoscope will be removed.  A flexible tube (catheter) may be inserted through your urethra so that one end is in your bladder. This helps to drain urine from your bladder. The procedure may vary among hospitals and health care providers. What happens after the procedure?  Your blood pressure, heart rate, breathing rate, and blood oxygen level will be monitored until you leave the hospital or clinic.  You may continue to receive medicine and fluids through an IV.  You may have some soreness or pain in your abdomen and urethra. Medicines will be available to help you.  You will be encouraged to get up and walk around as soon as you can.  You may have a catheter draining your urine.  You will have some blood in your urine.  Do not drive for 24 hours if you were given a sedative during your procedure. Summary  Ureteral stent implantation is a procedure to insert a flexible, soft, plastic tube (stent) into a ureter.  You may have a stent implanted to support the ureter while it heals after a procedure or to open the flow of urine if there is a blockage.  Follow instructions from your health care provider about taking medicines and about eating and drinking before the procedure.  Depending on your condition, you may have a stent for just a few weeks, or you may have a long-term stent that will need to be replaced every  few months. This information is not intended to replace advice given to you by your health care provider. Make sure you discuss any questions you have with your health care provider. Document Released: 04/18/2000 Document Revised: 01/26/2018 Document Reviewed: 01/27/2018 Elsevier Patient Education  2020 [employer].    Ureteral Stent Implantation, Care After This sheet gives you information about how to care for yourself after your procedure. Your health care provider may also give you more specific instructions. If you have problems or questions, contact your health care provider. What can I expect after the procedure? After the procedure, it is common to have:  Nausea.  Mild pain when you urinate. You may feel this pain in your lower back or lower abdomen. The pain should stop within a few minutes after you urinate. This may last for up to 1 week.  A small amount of blood in your urine for several days. Follow these instructions at home: Medicines  Take over-the-counter and prescription medicines only as told by your health care provider.  If you were prescribed an antibiotic medicine, take it as told by your health care provider. Do not stop taking the antibiotic even if you start to feel better.  Do not drive for 24 hours if you were given a sedative during your procedure.  Ask your health care provider if the medicine prescribed to you requires you to avoid driving or using heavy machinery. Activity  Rest as told by your health care provider.  Avoid sitting for a long time without moving. Get up to take short walks every 1-2 hours. This is important to improve blood flow and breathing. Ask for help if you feel weak or unsteady.  Return to your normal activities as told by your health care provider. Ask your health care provider what activities are safe for you. General instructions   Watch for any blood in your urine. Call your health care provider if the amount of blood  in your urine increases.  If you have a catheter: ? Follow instructions from your health care provider about taking care of your catheter and collection bag. ? Do not take baths, swim, or use a hot tub until your health care provider approves. Ask your health care provider if you may take showers. You may only be allowed to take sponge baths.  Drink enough fluid to keep your urine pale yellow.  Do not use any products that contain nicotine or tobacco, such as cigarettes, e-cigarettes, and chewing tobacco. These can delay healing after surgery. If you need help quitting, ask your health care provider.  Keep all follow-up visits as told by your health care provider. This is important. Contact a health care provider if:  You have pain that gets worse or does not get better with medicine, especially pain when you urinate.  You have difficulty urinating.  You feel nauseous or you vomit repeatedly during a period of more than 2 days after the procedure. Get help right away if:  Your urine is dark red or has blood clots in it.  You are leaking urine (have incontinence).  The end of the stent comes out of your urethra.  You cannot urinate.  You have sudden, sharp, or severe pain in your abdomen or lower back.  You have a fever.  You have swelling or pain in your legs.  You have difficulty breathing. Summary  After the procedure, it is common to have mild pain when you urinate that goes away within a few minutes after you urinate. This may last for up to 1 week.  Watch for any blood in your urine. Call your health care provider if the amount of blood in your urine increases.  Take over-the-counter and prescription medicines only as told by your health care provider.  Drink enough fluid to keep your urine pale yellow. This information is not intended to replace advice given to you by your health care provider. Make sure you discuss any questions you have with your health care  provider. Document Released: 12/22/2012 Document Revised: 01/26/2018 Document Reviewed: 01/27/2018 Elsevier Patient Education  2020 [employer].

## 2018-11-26 NOTE — Progress Notes (Signed)
11/26/2018 1:32 PM   Julie Acosta [birth year] [id number]  Referring provider: Maryland Pink, MD [address] [facility] [place],  [place] [address]  Chief Complaint  Patient presents with  . Nephrolithiasis    HPI: Julie Acosta is a 85 y.o. female with PMH recurrent nephrolithiasis and intermittent gross hematuria with multiple comorbidities here for preop counseling and evaluation prior to surgery with Dr. Diamantina Acosta today.  Patient underwent CT hematuria yesterday, which showed numerous bilateral ureteral stones. She was originally scheduled for bilateral cystoscopy, ureteroscopy, laser lithotripsy, and stent placement with Dr. Diamantina Acosta today.  She denies dysuria, fevers, chills, nausea, vomiting, and flank pain; however, clinic collected urine was positive for positive for RBC's, positive for protein, positive for nitrates, positive for leukocytes, positive for urobilinogen and positive for ketones.  Micro exam: 6-10 WBC's per HPF, >30 RBC's per HPF, few bacteria and amorphous sediment seen. Urine culture pending.  Based on these UA results, plan was made to cancel ureteroscopy with laser lithotripsy and proceed with bilateral stent placement today with plans to pursue definitive stone management after treating patient's UTI.  Her last solid food intake was last night at 7pm. She last drank water 12:21pm today. Xarelto last taken yesterday at noon.  Patient will need to be admitted overnight; office staff working with [facility] administration to contact Julie Acosta' loved ones to notify them of her location and coordinate transportation home tomorrow.  Patient requests that we contact the following individuals to let them know she will be admitted overnight:  - [place]: Julie Acosta (neighbor, was supposed to pick up Julie Acosta' ordered meals), Julie Acosta (independent living director, may be able to assist with transportation), Julie Acosta and Julie Acosta (friends,  they were supposed to go to Julie Acosta' home tonight) - Family: Julie Acosta (daughter), Julie Acosta (sister, please ask [place] to contact her to let her know what's going on).  PMH: Past Medical History:  Diagnosis Date  . Anxiety   . Benign breast lumps   . Blood clotting disorder ([place])   . Bone cancer ([place])   . Breast cancer of upper-outer quadrant of left female breast ([place]) 07/27/2017   11 mm invasive mammary carcinoma, ER 90%, PR 51-90%, negative margins.  Oncotype recurrence score: 1.  DCIS, margin less than 0.5 mm.  Negative sentinel node.  Accelerated partial breast radiation.  . Cervicalgia   . Chronic kidney disease    kidney stones  . Colon cancer ([place])   . Complication of anesthesia    MOOD ALTERATION / UNSURE IF ANES OR PAIN MED. NO TROUBLE WITH MOH'S  . Cough    NASAL DRIP / SNEEZING / SORE THROAT MOSTLY CONSTANT  . Depression   . Diabetes ([place])   . Diverticulitis   . Dizzy   . GERD (gastroesophageal reflux disease)   . HBP (high blood pressure)   . Hematuria    gross  . Hemorrhoids   . HLD (hyperlipidemia)   . Muscle pain   . Osteoarthritis   . Osteoporosis   . Panic disorder   . PND (post-nasal drip)    CHRONIC WITH SORE THROAT AND SNEEZING  . Reflux   . Skin cancer    nose  . Swelling   . Tremors of nervous system     Surgical History: Past Surgical History:  Procedure Laterality Date  . APPENDECTOMY  1962  . BREAST BIOPSY Right 2006?   benign  . BREAST CYST EXCISION Left 07/27/2017  Procedure: SKIN CYST EXCISED;  Surgeon: Robert Bellow, MD;  Location: [facility] ORS;  Service: General;  Laterality: Left;  . BREAST LUMPECTOMY Left 07/2017   invasive mammary, DCIS  mammosite  . BREAST SURGERY Right 2019  . CARPAL TUNNEL RELEASE Right   . CATARACT EXTRACTION W/PHACO Left 11/11/2016   Procedure: CATARACT EXTRACTION PHACO AND INTRAOCULAR LENS PLACEMENT (IOC);  Surgeon: Birder Robson, MD;  Location: [facility] ORS;  Service: Ophthalmology;  Laterality: Left;   [place] 01:07.9 AP% 19.2 CDE 13.02 Fluid pack lot # [id number]  . CATARACT EXTRACTION W/PHACO Right 12/09/2016   Procedure: CATARACT EXTRACTION PHACO AND INTRAOCULAR LENS PLACEMENT (IOC);  Surgeon: Birder Robson, MD;  Location: [facility] ORS;  Service: Ophthalmology;  Laterality: Right;  [place] 00:49 AP% 21.2 CDE 10.39 Fluid pack lot # [id number]  . CHOLECYSTECTOMY    . COLONOSCOPY WITH PROPOFOL N/A 11/22/2014   Procedure: COLONOSCOPY WITH PROPOFOL;  Surgeon: Manya Silvas, MD;  Location: [facility] ENDOSCOPY;  Service: Endoscopy;  Laterality: N/A;  . ESOPHAGOGASTRODUODENOSCOPY  11/22/2014   Procedure: ESOPHAGOGASTRODUODENOSCOPY (EGD);  Surgeon: Manya Silvas, MD;  Location: [facility] ENDOSCOPY;  Service: Endoscopy;;  . ESOPHAGOGASTRODUODENOSCOPY (EGD) WITH PROPOFOL N/A 03/05/2017   Procedure: ESOPHAGOGASTRODUODENOSCOPY (EGD) WITH PROPOFOL;  Surgeon: Lin Landsman, MD;  Location: [facility] ENDOSCOPY;  Service: Gastroenterology;  Laterality: N/A;  . EXTRACORPOREAL SHOCK WAVE LITHOTRIPSY Right 03/04/2018   Procedure: EXTRACORPOREAL SHOCK WAVE LITHOTRIPSY (ESWL);  Surgeon: Billey Co, MD;  Location: [facility] ORS;  Service: Urology;  Laterality: Right;  . EYE SURGERY  2012   cataract extraction with iol  . GALLBLADDER SURGERY  1968  . hemi pelvectomy    . HEMIPELVIC Left 1962  . LEG SURGERY Left    AMPUTATION  . MASTECTOMY, PARTIAL Left 07/27/2017   Procedure: MASTECTOMY PARTIAL;  Surgeon: Robert Bellow, MD;  Location: [facility] ORS;  Service: General;  Laterality: Left;  . MOUTH SURGERY  2019  . OVARY SURGERY Right   . SAVORY DILATION  11/22/2014   Procedure: SAVORY DILATION;  Surgeon: Manya Silvas, MD;  Location: [facility] ENDOSCOPY;  Service: Endoscopy;;  . SENTINEL NODE BIOPSY Left 07/27/2017   Procedure: SENTINEL NODE BIOPSY;  Surgeon: Robert Bellow, MD;  Location: [facility] ORS;  Service: General;  Laterality: Left;  . SKIN CANCER EXCISION    . TONSILLECTOMY      Home Medications:  Allergies as of  11/26/2018      Reactions   Ciprofloxacin Hives   Codeine Other (See Comments)   HALLUCINATIONS   Tape    Rash and skin irritation/ paper tape and tegaderm OK   Ciprocinonide [fluocinolone] Other (See Comments)   unknown   Amoxicillin Other (See Comments)   Upset stomach Has patient had a PCN reaction causing immediate rash, facial/tongue/throat swelling, SOB or lightheadedness with hypotension: No Has patient had a PCN reaction causing severe rash involving mucus membranes or skin necrosis: No Has patient had a PCN reaction that required hospitalization: No Has patient had a PCN reaction occurring within the last 10 years: Yes If all of the above answers are "NO", then may proceed with Cephalosporin use.;   Cefuroxime Other (See Comments)   OK INTRACAMERALLY PER DR WLP, upset stomach   Lipitor [atorvastatin] Other (See Comments)   unknown      Medication List    Notice   This visit is during an admission. Changes to the med list made in this visit will be reflected in the After Visit Summary of the admission.  Allergies:  Allergies  Allergen Reactions  . Ciprofloxacin Hives  . Codeine Other (See Comments)    HALLUCINATIONS  . Tape     Rash and skin irritation/ paper tape and tegaderm OK  . Ciprocinonide [Fluocinolone] Other (See Comments)    unknown  . Amoxicillin Other (See Comments)    Upset stomach Has patient had a PCN reaction causing immediate rash, facial/tongue/throat swelling, SOB or lightheadedness with hypotension: No Has patient had a PCN reaction causing severe rash involving mucus membranes or skin necrosis: No Has patient had a PCN reaction that required hospitalization: No Has patient had a PCN reaction occurring within the last 10 years: Yes If all of the above answers are "NO", then may proceed with Cephalosporin use.;  . Cefuroxime Other (See Comments)    OK INTRACAMERALLY PER DR WLP, upset stomach  . Lipitor [Atorvastatin] Other (See Comments)     unknown    Family History: Family History  Problem Relation Age of Onset  . Breast cancer Paternal Aunt   . Ovarian cancer Sister   . Prostate cancer Father   . Stroke Mother   . Kidney cancer Neg Hx   . Bladder Cancer Neg Hx     Social History:  reports that she has never smoked. She has never used smokeless tobacco. She reports that she does not drink alcohol or use drugs.  ROS: UROLOGY Frequent Urination?: Yes Hard to postpone urination?: No Burning/pain with urination?: No Get up at night to urinate?: Yes Leakage of urine?: No Urine stream starts and stops?: No Trouble starting stream?: No Do you have to strain to urinate?: No Blood in urine?: Yes Urinary tract infection?: No Sexually transmitted disease?: No Injury to kidneys or bladder?: No Painful intercourse?: No Weak stream?: No Currently pregnant?: No Vaginal bleeding?: No Last menstrual period?: n  Gastrointestinal Nausea?: No Vomiting?: No Indigestion/heartburn?: No Diarrhea?: No Constipation?: No  Constitutional Fever: No Night sweats?: No Weight loss?: No Fatigue?: No  Skin Skin rash/lesions?: No Itching?: No  Eyes Blurred vision?: No Double vision?: No  Ears/Nose/Throat Sore throat?: No Sinus problems?: Yes  Hematologic/Lymphatic Swollen glands?: No Easy bruising?: No  Cardiovascular Leg swelling?: Yes Chest pain?: No  Respiratory Cough?: No Shortness of breath?: No  Endocrine Excessive thirst?: Yes  Musculoskeletal Back pain?: No Joint pain?: No  Neurological Headaches?: No Dizziness?: No  Psychologic Depression?: No Anxiety?: Yes  Physical Exam: BP 121/75 (BP Location: Left Arm, Patient Position: Sitting, Cuff Size: Normal)   Pulse 93   Ht 5' (1.524 m)   BMI 30.08 kg/m   Constitutional:  Alert and oriented, No acute distress. HEENT: [place] AT, moist mucus membranes.  Trachea midline, no masses. Cardiovascular: No clubbing, cyanosis, or edema. Respiratory:  Normal respiratory effort, no increased work of breathing. GU: No CVA tenderness MSK: Missing left leg, s/p amputation Skin: No rashes, bruises or suspicious lesions. Neurologic: Grossly intact, no focal deficits, moving all 3 extremities. Psychiatric: Normal mood and affect.  Laboratory Data: Urinalysis    Component Value Date/Time   COLORURINE YELLOW (A) 10/02/2017 1157   APPEARANCEUR Cloudy (A) 11/26/2018 1157   LABSPEC 1.019 10/02/2017 1157   PHURINE 5.0 10/02/2017 1157   GLUCOSEU Negative 11/26/2018 1157   HGBUR SMALL (A) 10/02/2017 1157   BILIRUBINUR Negative 11/26/2018 [place] 10/02/2017 1157   PROTEINUR 3+ (A) 11/26/2018 1157   PROTEINUR NEGATIVE 10/02/2017 1157   NITRITE Positive (A) 11/26/2018 1157   NITRITE NEGATIVE 10/02/2017 1157   LEUKOCYTESUR Trace (  A) 11/26/2018 1157   Results for orders placed or performed in visit on 11/26/18  CULTURE, URINE COMPREHENSIVE     Status: None   Collection Time: 11/26/18 11:57 AM   Specimen: Urine   UR  Result Value Ref Range Status   Urine Culture, Comprehensive Final report  Final   Organism ID, Bacteria Comment  Final    Comment: No growth in 36 - 48 hours.  Microscopic Examination     Status: Abnormal   Collection Time: 11/26/18 11:57 AM   URINE  Result Value Ref Range Status   WBC, UA 6-10 (A) 0 - 5 /hpf Final   RBC >30 (A) 0 - 2 /hpf Final   Epithelial Cells (non renal) 0-10 0 - 10 /hpf Final   Crystals Present (A) N/A Final   Crystal Type Amorphous Sediment N/A Final   Bacteria, UA Few None seen/Few Final   Lab Results  Component Value Date   CREATININE 0.70 11/25/2018   Pertinent Imaging: Results for orders placed during the hospital encounter of 11/25/18  CT HEMATURIA WORKUP   Narrative CLINICAL DATA:  Gross hematuria. History of bone, breast, and skin cancer. Prior left mastectomy.  EXAM: CT ABDOMEN AND PELVIS WITHOUT AND WITH CONTRAST  TECHNIQUE: Multidetector CT imaging of the  abdomen and pelvis was performed following the standard protocol before and following the bolus administration of intravenous contrast.  CONTRAST:  114mL OMNIPAQUE IOHEXOL 300 MG/ML  SOLN  COMPARISON:  02/18/2018 stone study.  FINDINGS: Lower chest: Left lower lobe 8 mm nodule on image 10/9 measured 7 mm on the most recent and was felt to be present on 05/09/2014.  An adjacent more lateral 5 mm nodule in the left lower lobe was present back in 2016.  4 mm right lower lobe pulmonary nodule is unchanged. Normal heart size without pericardial or pleural effusion.  Hepatobiliary: Moderate hepatic steatosis, without focal liver lesion. Normal gallbladder, without biliary ductal dilatation.  Pancreas: Normal, without mass or ductal dilatation.  Spleen: Normal in size, without focal abnormality.  Adrenals/Urinary Tract: Minimal right adrenal nodularity is unchanged. Mild left adrenal nodularity is felt to be similar and low-density prior to contrast, favoring an adenoma.  Punctate bilateral renal collecting system calculi. Multiple mid to distal small right renal calculi, including on coronal series 7. Maximal 5 mm. No significant proximal obstruction.  Mild left-sided hydroureteronephrosis to the level of multiple mid to distal left ureteric stones, including at 5 mm on coronal image 69/7. No bladder calculi. Left renal too small to characterize lesions. No suspicious renal mass. Good ureteric opacification, without other filling defects. The bladder is decompressed with anatomic distortion secondary to surgical changes in the pelvis. No bladder filling defect on poorly distended delayed images.  Stomach/Bowel: Normal stomach, without wall thickening. Extensive colonic diverticulosis. Normal terminal ileum. Normal small bowel.  Vascular/Lymphatic: Advanced aortic and branch vessel atherosclerosis. Left common iliac artery narrowing may be physiologic in the setting of left  hemipelvectomy.  Reproductive: Small uterine fibroids.  No adnexal mass.  Other: No significant free fluid.  Musculoskeletal: Left hemipelvectomy. Lower thoracic spondylosis. Right L2 hemangioma. Convex left lumbar spine curvature.  IMPRESSION: 1. Multiple bilateral mid to distal ureteric calculi. Mild left-sided hydroureteronephrosis with interval resolution of right-sided urinary tract obstruction. 2. Hepatic steatosis. 3. Bilateral nephrolithiasis. 4. Uterine fibroids. 5.  Aortic Atherosclerosis (ICD10-I70.0). 6. Bibasilar pulmonary nodules which are similar to on the prior, favoring a benign etiology.   Electronically Signed   By: Adria Devon.D.  On: 11/25/2018 15:22    I personally reviewed the images referenced above and agree with the radiologic interpretation.  Assessment & Plan:   1. Preop bilateral ureteral stent placement I counseled the patient to remain NPO in anticipation of her surgery this afternoon.  I discussed the risks of stent placement, namely bleeding, infection, and damage to surrounding structures. I stated that she would receive perioperative antibiotics to mitigate infection risk. I discussed with her the risks of not proceeding with surgery, namely urinary obstruction, hydronephrosis, and worsening of infection.  I counseled the patient that stents can cause pain in the flank or groin and/or gross hematuria. I informed her that she may expect to experience these symptoms for the duration of the stents being in place. I counseled her that she should follow up with [place] urgently if she develops new fever, chills, nausea, or vomiting before they are removed, as these are not typical symptoms associated with a stent.  I informed her that we would plan to return to the OR to pursue definitive stone management in a couple of weeks once her infection was treated. She expressed understanding of this plan.  -Sent patient to surgical scheduling to complete  COVID testing -Urinalysis, complete -Urine culture -Remain NPO in advance of today's surgery -Coordinating with [facility] administration for notification of loved ones and transportation coordination -Antibiotics to be administered periop and upon discharge -Bilateral ureteroscopy with laser lithotripsy and stent exchange in two weeks  Debroah Loop, PA-C  [place] [address], [place], [place] [address] [phone]

## 2018-11-26 NOTE — Plan of Care (Signed)
  Problem: Education: Goal: Knowledge of General Education information will improve Description: Including pain rating scale, medication(s)/side effects and non-pharmacologic comfort measures Outcome: Progressing   Problem: Clinical Measurements: Goal: Will remain free from infection Outcome: Progressing   Problem: Activity: Goal: Risk for activity intolerance will decrease Outcome: Progressing   

## 2018-11-26 NOTE — H&P (Signed)
11/26/18 5:47 PM   Dianah Field Dec 19, 1933 [id number]  HPI: Ms. Carvell is a very nice 85 year old female with a number of comorbidities who recently underwent CT urogram for work-up of asymptomatic gross hematuria, and was found to have bilateral distal ureteral stones.  She is completely asymptomatic without flank pain, and is making good urine.  She denies any urinary symptoms, fevers, or chills.  There are no aggravating or alleviating factors.   Urinalysis today was nitrite positive, with few bacteria, 6-10 WBCs, sent for culture.  PMH: Past Medical History:  Diagnosis Date  . Anxiety   . Benign breast lumps   . Blood clotting disorder ([place])   . Bone cancer ([place])   . Breast cancer of upper-outer quadrant of left female breast ([place]) 07/27/2017   11 mm invasive mammary carcinoma, ER 90%, PR 51-90%, negative margins.  Oncotype recurrence score: 1.  DCIS, margin less than 0.5 mm.  Negative sentinel node.  Accelerated partial breast radiation.  . Cervicalgia   . Chronic kidney disease    kidney stones  . Colon cancer ([place])   . Complication of anesthesia    MOOD ALTERATION / UNSURE IF ANES OR PAIN MED. NO TROUBLE WITH MOH'S  . Cough    NASAL DRIP / SNEEZING / SORE THROAT MOSTLY CONSTANT  . Depression   . Diabetes ([place])   . Diverticulitis   . Dizzy   . GERD (gastroesophageal reflux disease)   . HBP (high blood pressure)   . Hematuria    gross  . Hemorrhoids   . HLD (hyperlipidemia)   . Muscle pain   . Osteoarthritis   . Osteoporosis   . Panic disorder   . PND (post-nasal drip)    CHRONIC WITH SORE THROAT AND SNEEZING  . Reflux   . Skin cancer    nose  . Swelling   . Tremors of nervous system     Surgical History: Past Surgical History:  Procedure Laterality Date  . APPENDECTOMY  1962  . BREAST BIOPSY Right 2006?   benign  . BREAST CYST EXCISION Left 07/27/2017   Procedure: SKIN CYST EXCISED;  Surgeon: Robert Bellow, MD;  Location: [facility] ORS;   Service: General;  Laterality: Left;  . BREAST LUMPECTOMY Left 07/2017   invasive mammary, DCIS  mammosite  . BREAST SURGERY Right 2019  . CARPAL TUNNEL RELEASE Right   . CATARACT EXTRACTION W/PHACO Left 11/11/2016   Procedure: CATARACT EXTRACTION PHACO AND INTRAOCULAR LENS PLACEMENT (IOC);  Surgeon: Birder Robson, MD;  Location: [facility] ORS;  Service: Ophthalmology;  Laterality: Left;  [place] 01:07.9 AP% 19.2 CDE 13.02 Fluid pack lot # [id number]  . CATARACT EXTRACTION W/PHACO Right 12/09/2016   Procedure: CATARACT EXTRACTION PHACO AND INTRAOCULAR LENS PLACEMENT (IOC);  Surgeon: Birder Robson, MD;  Location: [facility] ORS;  Service: Ophthalmology;  Laterality: Right;  [place] 00:49 AP% 21.2 CDE 10.39 Fluid pack lot # [id number]  . CHOLECYSTECTOMY    . COLONOSCOPY WITH PROPOFOL N/A 11/22/2014   Procedure: COLONOSCOPY WITH PROPOFOL;  Surgeon: Manya Silvas, MD;  Location: [facility] ENDOSCOPY;  Service: Endoscopy;  Laterality: N/A;  . ESOPHAGOGASTRODUODENOSCOPY  11/22/2014   Procedure: ESOPHAGOGASTRODUODENOSCOPY (EGD);  Surgeon: Manya Silvas, MD;  Location[facility] ENDOSCOPY;  Service: Endoscopy;;  . ESOPHAGOGASTRODUODENOSCOPY (EGD) WITH PROPOFOL N/A 03/05/2017   Procedure: ESOPHAGOGASTRODUODENOSCOPY (EGD) WITH PROPOFOL;  Surgeon: Lin Landsman, MD;  Location: [facility] ENDOSCOPY;  Service: Gastroenterology;  Laterality: N/A;  . EXTRACORPOREAL SHOCK WAVE LITHOTRIPSY Right 03/04/2018   Procedure: EXTRACORPOREAL SHOCK  WAVE LITHOTRIPSY (ESWL);  Surgeon: Billey Co, MD;  Location: [facility] ORS;  Service: Urology;  Laterality: Right;  . EYE SURGERY  2012   cataract extraction with iol  . GALLBLADDER SURGERY  1968  . hemi pelvectomy    . HEMIPELVIC Left 1962  . LEG SURGERY Left    AMPUTATION  . MASTECTOMY, PARTIAL Left 07/27/2017   Procedure: MASTECTOMY PARTIAL;  Surgeon: Robert Bellow, MD;  Location: [facility] ORS;  Service: General;  Laterality: Left;  . MOUTH SURGERY  2019  . OVARY SURGERY Right   .  SAVORY DILATION  11/22/2014   Procedure: SAVORY DILATION;  Surgeon: Manya Silvas, MD;  Location: [facility] ENDOSCOPY;  Service: Endoscopy;;  . SENTINEL NODE BIOPSY Left 07/27/2017   Procedure: SENTINEL NODE BIOPSY;  Surgeon: Robert Bellow, MD;  Location: [facility] ORS;  Service: General;  Laterality: Left;  . SKIN CANCER EXCISION    . TONSILLECTOMY       Family History: Family History  Problem Relation Age of Onset  . Breast cancer Paternal Aunt   . Ovarian cancer Sister   . Prostate cancer Father   . Stroke Mother   . Kidney cancer Neg Hx   . Bladder Cancer Neg Hx     Social History:  reports that she has never smoked. She has never used smokeless tobacco. She reports that she does not drink alcohol or use drugs.  ROS: Please see flowsheet from today's date for complete review of systems.  Physical Exam: BP (!) 142/54 (BP Location: Right Arm)   Pulse 99   Temp 98.4 F (36.9 C) (Oral)   Resp 16   Ht 5' (1.524 m)   Wt 71.7 kg   SpO2 95%   BMI 30.87 kg/m    Constitutional:  Alert and oriented, No acute distress. Cardiovascular: Regular rate and rhythm Respiratory: Clear to auscultation bilaterally GI: Abdomen is soft, nontender, nondistended, no abdominal masses GU: No CVA tenderness Lymph: No cervical or inguinal lymphadenopathy. Skin: No rashes, bruises or suspicious lesions. Psychiatric: Normal mood and affect.  Laboratory Data: Reviewed  Pertinent Imaging: Personally reviewed the CT urogram, bilateral distal ureteral stones  Assessment & Plan:   In summary, the patient is a comorbid 85 year old female who underwent outpatient work-up for asymptomatic gross hematuria, and was found to have bilateral distal ureteral stones.  There is no hydronephrosis on the right, but there is mild left hydroureteronephrosis.  Urinalysis is also concerning for possible infection today.  She is completely asymptomatic and denies any pain, fevers, or chills.  We discussed the need for  bilateral drainage with stents prior to undergoing definitive management with ureteroscopy and laser lithotripsy.  We discussed the risks and benefits at length.  -Bilateral ureteral stent placement tomorrow morning with Dr. Junious Silk, likely discharge on Bactrim and follow-up cultures -Follow-up in 2 to 3 weeks for definitive management with ureteroscopy and laser lithotripsy  Billey Co, MD  [place] [address] [place], [place] [address] [phone]

## 2018-11-27 ENCOUNTER — Observation Stay: Payer: Medicare Other | Admitting: Certified Registered"

## 2018-11-27 ENCOUNTER — Observation Stay: Payer: Medicare Other

## 2018-11-27 ENCOUNTER — Encounter
Admission: RE | Disposition: A | Discharge status: Home / Self Care | Payer: Self-pay | Source: Skilled Nursing Facility | Attending: Urology

## 2018-11-27 ENCOUNTER — Encounter: Payer: Self-pay | Admitting: Anesthesiology

## 2018-11-27 DIAGNOSIS — N132 Hydronephrosis with renal and ureteral calculous obstruction: Secondary | ICD-10-CM | POA: Diagnosis not present

## 2018-11-27 DIAGNOSIS — R310 Gross hematuria: Secondary | ICD-10-CM | POA: Diagnosis not present

## 2018-11-27 HISTORY — PX: CYSTOSCOPY WITH STENT PLACEMENT: SHX5790

## 2018-11-27 LAB — GLUCOSE, CAPILLARY
Glucose-Capillary: 173 mg/dL — ABNORMAL HIGH (ref 70–99)
Glucose-Capillary: 137 mg/dL — ABNORMAL HIGH (ref 70–99)
Glucose-Capillary: 140 mg/dL — ABNORMAL HIGH (ref 70–99)

## 2018-11-27 SURGERY — CYSTOSCOPY, WITH STENT INSERTION
Anesthesia: General | Laterality: Bilateral

## 2018-11-27 SURGERY — CYSTOSCOPY/URETEROSCOPY/HOLMIUM LASER/STENT PLACEMENT
Anesthesia: Choice | Laterality: Bilateral

## 2018-11-27 MED ORDER — LACTATED RINGERS IV SOLN
INTRAVENOUS | Status: DC | PRN
  Administered 2018-11-27: 10:00:00 via INTRAVENOUS

## 2018-11-27 MED ORDER — LIDOCAINE HCL (PF) 2 % IJ SOLN
INTRAMUSCULAR | Status: AC
  Filled 2018-11-27: qty 10

## 2018-11-27 MED ORDER — DEXAMETHASONE SODIUM PHOSPHATE 10 MG/ML IJ SOLN
INTRAMUSCULAR | Status: DC | PRN
  Administered 2018-11-27: 4 mg via INTRAVENOUS

## 2018-11-27 MED ORDER — GLYCOPYRROLATE 0.2 MG/ML IJ SOLN
INTRAMUSCULAR | Status: DC | PRN
  Administered 2018-11-27: 0.1 mg via INTRAVENOUS

## 2018-11-27 MED ORDER — PROPOFOL 10 MG/ML IV BOLUS
INTRAVENOUS | Status: DC | PRN
  Administered 2018-11-27: 80 mg via INTRAVENOUS
  Administered 2018-11-27 (×2): 10 mg via INTRAVENOUS
  Administered 2018-11-27: 20 mg via INTRAVENOUS

## 2018-11-27 MED ORDER — ONDANSETRON HCL 4 MG/2ML IJ SOLN
INTRAMUSCULAR | Status: DC | PRN
  Administered 2018-11-27: 4 mg via INTRAVENOUS

## 2018-11-27 MED ORDER — EPHEDRINE SULFATE 50 MG/ML IJ SOLN
INTRAMUSCULAR | Status: DC | PRN
  Administered 2018-11-27: 10 mg via INTRAVENOUS
  Administered 2018-11-27 (×2): 5 mg via INTRAVENOUS

## 2018-11-27 MED ORDER — PHENYLEPHRINE HCL (PRESSORS) 10 MG/ML IV SOLN
INTRAVENOUS | Status: AC
  Filled 2018-11-27: qty 1

## 2018-11-27 MED ORDER — EPHEDRINE SULFATE 50 MG/ML IJ SOLN
INTRAMUSCULAR | Status: AC
  Filled 2018-11-27: qty 1

## 2018-11-27 MED ORDER — FENTANYL CITRATE (PF) 100 MCG/2ML IJ SOLN: 25.0000 ug | INTRAMUSCULAR | Status: DC | PRN

## 2018-11-27 MED ORDER — PROPOFOL 10 MG/ML IV BOLUS
INTRAVENOUS | Status: AC
  Filled 2018-11-27: qty 40

## 2018-11-27 MED ORDER — DEXAMETHASONE SODIUM PHOSPHATE 10 MG/ML IJ SOLN
INTRAMUSCULAR | Status: AC
  Filled 2018-11-27: qty 1

## 2018-11-27 MED ORDER — PHENYLEPHRINE HCL (PRESSORS) 10 MG/ML IV SOLN
INTRAVENOUS | Status: DC | PRN
  Administered 2018-11-27 (×10): 100 ug via INTRAVENOUS

## 2018-11-27 MED ORDER — GLYCOPYRROLATE 0.2 MG/ML IJ SOLN
INTRAMUSCULAR | Status: AC
  Filled 2018-11-27: qty 2

## 2018-11-27 MED ORDER — FENTANYL CITRATE (PF) 100 MCG/2ML IJ SOLN
INTRAMUSCULAR | Status: AC
  Filled 2018-11-27: qty 2

## 2018-11-27 MED ORDER — LIDOCAINE HCL (CARDIAC) PF 100 MG/5ML IV SOSY
PREFILLED_SYRINGE | INTRAVENOUS | Status: DC | PRN
  Administered 2018-11-27: 60 mg via INTRAVENOUS

## 2018-11-27 MED ORDER — FENTANYL CITRATE (PF) 100 MCG/2ML IJ SOLN
INTRAMUSCULAR | Status: DC | PRN
  Administered 2018-11-27 (×4): 25 ug via INTRAVENOUS

## 2018-11-27 MED ORDER — ONDANSETRON HCL 4 MG/2ML IJ SOLN
INTRAMUSCULAR | Status: AC
  Filled 2018-11-27: qty 2

## 2018-11-27 SURGICAL SUPPLY — 17 items
BAG DRAIN CYSTO-URO LG1000N (MISCELLANEOUS) ×2 IMPLANT
CATH URETL 5X70 OPEN END (CATHETERS) ×2 IMPLANT
CATH URETL OPEN END 6X70 (CATHETERS) IMPLANT
GLOVE BIO SURGEON STRL SZ7.5 (GLOVE) ×6 IMPLANT
GLOVE BIO SURGEON STRL SZ8 (GLOVE) ×2 IMPLANT
GOWN STRL REUS W/ TWL LRG LVL4 (GOWN DISPOSABLE) ×1 IMPLANT
GOWN STRL REUS W/ TWL XL LVL3 (GOWN DISPOSABLE) ×1 IMPLANT
GOWN STRL REUS W/TWL LRG LVL4 (GOWN DISPOSABLE) ×1
GOWN STRL REUS W/TWL XL LVL3 (GOWN DISPOSABLE) ×1
GUIDEWIRE STR ZIPWIRE 035X150 (MISCELLANEOUS) ×2 IMPLANT
PACK CYSTO AR (MISCELLANEOUS) ×2 IMPLANT
SET CYSTO W/LG BORE CLAMP LF (SET/KITS/TRAYS/PACK) ×2 IMPLANT
SOL .9 NS 3000ML IRR  AL (IV SOLUTION) ×1
SOL .9 NS 3000ML IRR UROMATIC (IV SOLUTION) ×1 IMPLANT
STENT URET 6FRX24 CONTOUR (STENTS) ×2 IMPLANT
STENT URET 6FRX26 CONTOUR (STENTS) ×2 IMPLANT
WATER STERILE IRR 1000ML POUR (IV SOLUTION) ×2 IMPLANT

## 2018-11-27 SURGICAL SUPPLY — 29 items
BAG DRAIN CYSTO-URO LG1000N (MISCELLANEOUS) ×2 IMPLANT
BRUSH SCRUB EZ 1% IODOPHOR (MISCELLANEOUS) ×2 IMPLANT
CATH URETL 5X70 OPEN END (CATHETERS) IMPLANT
CNTNR SPEC 2.5X3XGRAD LEK (MISCELLANEOUS)
CONT SPEC 4OZ STER OR WHT (MISCELLANEOUS)
CONTAINER SPEC 2.5X3XGRAD LEK (MISCELLANEOUS) IMPLANT
DRAPE UTILITY 15X26 TOWEL STRL (DRAPES) ×2 IMPLANT
FIBER LASER LITHO 273 (Laser) IMPLANT
GLOVE BIOGEL PI IND STRL 7.5 (GLOVE) ×1 IMPLANT
GLOVE BIOGEL PI INDICATOR 7.5 (GLOVE) ×1
GOWN STRL REUS W/ TWL LRG LVL3 (GOWN DISPOSABLE) ×1 IMPLANT
GOWN STRL REUS W/ TWL XL LVL3 (GOWN DISPOSABLE) ×1 IMPLANT
GOWN STRL REUS W/TWL LRG LVL3 (GOWN DISPOSABLE) ×1
GOWN STRL REUS W/TWL XL LVL3 (GOWN DISPOSABLE) ×1
GUIDEWIRE STR DUAL SENSOR (WIRE) ×2 IMPLANT
INFUSOR MANOMETER BAG 3000ML (MISCELLANEOUS) ×2 IMPLANT
INTRODUCER DILATOR DOUBLE (INTRODUCER) IMPLANT
KIT TURNOVER CYSTO (KITS) ×2 IMPLANT
PACK CYSTO AR (MISCELLANEOUS) ×2 IMPLANT
SET CYSTO W/LG BORE CLAMP LF (SET/KITS/TRAYS/PACK) ×2 IMPLANT
SHEATH URETERAL 12FRX35CM (MISCELLANEOUS) IMPLANT
SOL .9 NS 3000ML IRR  AL (IV SOLUTION) ×1
SOL .9 NS 3000ML IRR UROMATIC (IV SOLUTION) ×1 IMPLANT
STENT URET 6FRX24 CONTOUR (STENTS) IMPLANT
STENT URET 6FRX26 CONTOUR (STENTS) IMPLANT
SURGILUBE 2OZ TUBE FLIPTOP (MISCELLANEOUS) ×2 IMPLANT
SYR 10ML LL (SYRINGE) ×2 IMPLANT
VALVE UROSEAL ADJ ENDO (VALVE) IMPLANT
WATER STERILE IRR 1000ML POUR (IV SOLUTION) ×2 IMPLANT

## 2018-11-27 NOTE — Discharge Instructions (Signed)
Ureteral Stent Implantation, Care After This sheet gives you information about how to care for yourself after your procedure. Your health care provider may also give you more specific instructions. If you have problems or questions, contact your health care provider.  Be sure to follow-up with Dr. Diamantina Providence to have the stents/stones removed.  What can I expect after the procedure? After the procedure, it is common to have:  Nausea.  Mild pain when you urinate. You may feel this pain in your lower back or lower abdomen. The pain should stop within a few minutes after you urinate. This may last for up to 1 week.  A small amount of blood in your urine for several days. Follow these instructions at home: Medicines  Take over-the-counter and prescription medicines only as told by your health care provider.  If you were prescribed an antibiotic medicine, take it as told by your health care provider. Do not stop taking the antibiotic even if you start to feel better.  Do not drive for 24 hours if you were given a sedative during your procedure.  Ask your health care provider if the medicine prescribed to you requires you to avoid driving or using heavy machinery. Activity  Rest as told by your health care provider.  Avoid sitting for a long time without moving. Get up to take short walks every 1-2 hours. This is important to improve blood flow and breathing. Ask for help if you feel weak or unsteady.  Return to your normal activities as told by your health care provider. Ask your health care provider what activities are safe for you. General instructions   Watch for any blood in your urine. Call your health care provider if the amount of blood in your urine increases.  If you have a catheter: ? Follow instructions from your health care provider about taking care of your catheter and collection bag. ? Do not take baths, swim, or use a hot tub until your health care provider approves. Ask  your health care provider if you may take showers. You may only be allowed to take sponge baths.  Drink enough fluid to keep your urine pale yellow.  Do not use any products that contain nicotine or tobacco, such as cigarettes, e-cigarettes, and chewing tobacco. These can delay healing after surgery. If you need help quitting, ask your health care provider.  Keep all follow-up visits as told by your health care provider. This is important. Contact a health care provider if:  You have pain that gets worse or does not get better with medicine, especially pain when you urinate.  You have difficulty urinating.  You feel nauseous or you vomit repeatedly during a period of more than 2 days after the procedure. Get help right away if:  Your urine is dark red or has blood clots in it.  You are leaking urine (have incontinence).  The end of the stent comes out of your urethra.  You cannot urinate.  You have sudden, sharp, or severe pain in your abdomen or lower back.  You have a fever.  You have swelling or pain in your legs.  You have difficulty breathing. Summary  After the procedure, it is common to have mild pain when you urinate that goes away within a few minutes after you urinate. This may last for up to 1 week.  Watch for any blood in your urine. Call your health care provider if the amount of blood in your urine increases.  Take  over-the-counter and prescription medicines only as told by your health care provider.  Drink enough fluid to keep your urine pale yellow. This information is not intended to replace advice given to you by your health care provider. Make sure you discuss any questions you have with your health care provider. Document Released: 12/22/2012 Document Revised: 01/26/2018 Document Reviewed: 01/27/2018 Elsevier Patient Education  2020 [employer].

## 2018-11-27 NOTE — Progress Notes (Signed)
Day of Surgery Subjective: Patient reports feeling well.   Objective: Vital signs in last 24 hours: Temp:  [98 F (36.7 C)-98.7 F (37.1 C)] 98.5 F (36.9 C) (07/25 0742) Pulse Rate:  [75-112] 75 (07/25 0742) Resp:  [16-20] 19 (07/25 0742) BP: (112-142)/(51-112) 112/51 (07/25 0742) SpO2:  [92 %-95 %] 94 % (07/25 0742) Weight:  [71.6 kg-71.7 kg] 71.6 kg (07/25 0334)  Intake/Output from previous day: 07/24 0701 - 07/25 0700 In: 240 [P.O.:240] Out: 50 [Urine:50] Intake/Output this shift: No intake/output data recorded.  Physical Exam:  NAD Alert and O in pre-op Abd - soft, NT  Lab Results: No results for input(s): HGB, HCT in the last 72 hours. BMET Recent Labs    11/25/18 0959  CREATININE 0.70   No results for input(s): LABPT, INR in the last 72 hours. No results for input(s): LABURIN in the last 72 hours. Results for orders placed or performed during the hospital encounter of 11/26/18  MRSA PCR Screening     Status: None   Collection Time: 11/26/18 11:01 AM   Specimen: Nasopharyngeal  Result Value Ref Range Status   MRSA by PCR NEGATIVE NEGATIVE Final    Comment:        The GeneXpert MRSA Assay (FDA approved for NASAL specimens only), is one component of a comprehensive MRSA colonization surveillance program. It is not intended to diagnose MRSA infection nor to guide or monitor treatment for MRSA infections. Performed at [facility], [address] [place], [place] [address]   SARS Coronavirus 2 (CEPHEID - Performed in [facility] hospital lab), Hosp Order     Status: None   Collection Time: 11/26/18  1:05 PM   Specimen: Nasopharyngeal Swab  Result Value Ref Range Status   SARS Coronavirus 2 NEGATIVE NEGATIVE Final    Comment: (NOTE) If result is NEGATIVE SARS-CoV-2 target nucleic acids are NOT DETECTED. The SARS-CoV-2 RNA is generally detectable in upper and lower  respiratory specimens during the acute phase of infection. The lowest   concentration of SARS-CoV-2 viral copies this assay can detect is 250  copies / mL. A negative result does not preclude SARS-CoV-2 infection  and should not be used as the sole basis for treatment or other  patient management decisions.  A negative result may occur with  improper specimen collection / handling, submission of specimen other  than nasopharyngeal swab, presence of viral mutation(s) within the  areas targeted by this assay, and inadequate number of viral copies  (<250 copies / mL). A negative result must be combined with clinical  observations, patient history, and epidemiological information. If result is POSITIVE SARS-CoV-2 target nucleic acids are DETECTED. The SARS-CoV-2 RNA is generally detectable in upper and lower  respiratory specimens dur ing the acute phase of infection.  Positive  results are indicative of active infection with SARS-CoV-2.  Clinical  correlation with patient history and other diagnostic information is  necessary to determine patient infection status.  Positive results do  not rule out bacterial infection or co-infection with other viruses. If result is PRESUMPTIVE POSTIVE SARS-CoV-2 nucleic acids MAY BE PRESENT.   A presumptive positive result was obtained on the submitted specimen  and confirmed on repeat testing.  While 2019 novel coronavirus  (SARS-CoV-2) nucleic acids may be present in the submitted sample  additional confirmatory testing may be necessary for epidemiological  and / or clinical management purposes  to differentiate between  SARS-CoV-2 and other Sarbecovirus currently known to infect humans.  If clinically  indicated additional testing with an alternate test  methodology [phone]) is advised. The SARS-CoV-2 RNA is generally  detectable in upper and lower respiratory sp ecimens during the acute  phase of infection. The expected result is Negative. Fact Sheet for Patients:  StrictlyIdeas.no Fact Sheet  for Healthcare Providers: BankingDealers.co.za This test is not yet approved or cleared by the [place] FDA and has been authorized for detection and/or diagnosis of SARS-CoV-2 by FDA under an Emergency Use Authorization (EUA).  This EUA will remain in effect (meaning this test can be used) for the duration of the COVID-19 declaration under Section 564(b)(1) of the Act, 21 U.S.C. section 360bbb-3(b)(1), unless the authorization is terminated or revoked sooner. Performed at [facility], [address], [place], [place] [address]     Studies/Results: No results found.  Assessment/Plan:  Bilateral ureteral stones - discussed I discussed with the patient the nature, potential benefits, risks and alternatives to cystoscopy, bilateral RGP, bilateral ureteral stent placement, including side effects of the proposed treatment, the likelihood of the patient achieving the goals of the procedure, and any potential problems that might occur during the procedure or recuperation. Discussed rationale for staged procedure and f/u URS. Discussed possibility of failure to gain access. Patient elects to proceed. She can go home later today if she remains stable.      LOS: 0 days   Festus Aloe 11/27/2018, 10:27 AM

## 2018-11-27 NOTE — Transfer of Care (Signed)
Immediate Anesthesia Transfer of Care Note  Patient: Julie Acosta  Procedure(s) Performed: CYSTOSCOPY WITH STENT PLACEMENT (Bilateral )  Patient Location: PACU  Anesthesia Type:General  Level of Consciousness: awake, alert  and patient cooperative  Airway & Oxygen Therapy: Patient Spontanous Breathing and Patient connected to nasal cannula oxygen  Post-op Assessment: Report given to RN and Post -op Vital signs reviewed and stable  Post vital signs: Reviewed and stable  Last Vitals:  Vitals Value Taken Time  BP 146/130 11/27/18 1116  Temp    Pulse 93 11/27/18 1117  Resp    SpO2 100 % 11/27/18 1117  Vitals shown include unvalidated device data.  Last Pain:  Vitals:   11/27/18 0742  TempSrc: Oral  PainSc:       Patients Stated Pain Goal: 0 (07/37/10 6269)  Complications: No apparent anesthesia complications

## 2018-11-27 NOTE — Progress Notes (Signed)
Discharge instructions provided to pt.  All questions addressed.  Understanding verified through teach back.  Awaiting transportation home via POV.  

## 2018-11-27 NOTE — Plan of Care (Signed)
Pt ready for discharge home.   Problem: Education: Goal: Knowledge of General Education information will improve Description: Including pain rating scale, medication(s)/side effects and non-pharmacologic comfort measures Outcome: Completed/Met   Problem: Health Behavior/Discharge Planning: Goal: Ability to manage health-related needs will improve Outcome: Completed/Met   Problem: Clinical Measurements: Goal: Will remain free from infection Outcome: Completed/Met   Problem: Activity: Goal: Risk for activity intolerance will decrease Outcome: Completed/Met   Problem: Nutrition: Goal: Adequate nutrition will be maintained Outcome: Completed/Met   Problem: Coping: Goal: Level of anxiety will decrease Outcome: Completed/Met

## 2018-11-27 NOTE — Op Note (Signed)
Preoperative diagnosis: Bilateral ureteral stones, left greater than right hydronephrosis, gross hematuria  Postoperative diagnosis: Same  Procedure: Cystoscopy with bilateral retrograde pyelogram and bilateral ureteral stent placement.  Surgeon: Junious Silk  Anesthesia: General  Indication for procedure: Julie Acosta is an 85 year old female with a history of gross hematuria.  Hematuria evaluation revealed bilateral ureteral stones with left greater than right hydronephrosis.  She was brought for semiurgent ureteral stent placement in preparation for a staged bilateral ureteroscopy.  Findings: The bladder was unremarkable.    Left retrograde pyelogram-this outlined a single ureter single collecting system unit with multiple filling defects in about the mid ureter with proximal hydroureteronephrosis.  Right retrograde pyelogram-this outlined a single ureter single collecting system unit with filling defects in the right mid to distal ureter with mild proximal hydroureteronephrosis.  Disposition: Patient stable to PACU and she would be discharged when she is awake and stable.  Description of procedure: After consent was obtained patient brought to the operating room.  After adequate anesthesia she was placed in lithotomy position with her right leg in stirrup.  She was prepped and draped in usual sterile fashion.  A timeout was performed to confirm the patient and procedure.  The cystoscope was passed per urethra and the bladder inspected.  The left ureteral orifice was cannulated with a 5 [place] open-ended catheter and left retrograde injection of contrast was performed.  I then passed a Glidewire but met resistance at the stones.  I had to pass the 5 [place] open-ended catheter up to the stones to brace the wire and then the wire passed proximally and coiled in the left collecting system.  A 6 x 24 cm stent was advanced.  The wire was removed with a good coil seen in the left renal pelvis and a good  coil in the bladder.  Attention was then turned to the right side where the right ureteral orifice was cannulated with a 5 [place] open-ended catheter and retrograde injection of contrast was performed.  The Glidewire was advanced and again buckled at the stones.  I advanced the 5 [place] open-ended catheter to the stones and was able to get the wire passed and coiled out in the collecting system.  I then passed the 6 x 26 cm stent as the right collecting system looked actually a little bit higher than the left.  Again there was resistance at the stones when passing the stent and the wire had to be braced but the stent went up without difficulty.  The wire was removed with a good coil seen in the upper calyx and a good coil in the bladder.  The bladder was drained and the scope removed.  She was awakened and taken recovery room in stable condition.  Complications: None  Blood loss: Minimal  Specimens: None  Drains: Bilateral ureteral stents-6 x 26 on the right and 6 x 24 on the left.

## 2018-11-27 NOTE — Anesthesia Post-op Follow-up Note (Signed)
Anesthesia QCDR form completed.        

## 2018-11-27 NOTE — Care Management Obs Status (Signed)
[place] NOTIFICATION   Patient Details  Name: Julie Acosta MRN: [id number] Date of Birth: August 19, 1933   Medicare Observation Status Notification Given:  Yes    Naziya Hegwood A Cayson Kalb, RN 11/27/2018, 9:14 AM

## 2018-11-27 NOTE — Anesthesia Preprocedure Evaluation (Signed)
Anesthesia Evaluation  Patient identified by MRN, date of birth, ID band Patient awake    Reviewed: Allergy & Precautions, H&P , NPO status , Patient's Chart, lab work & pertinent test results  History of Anesthesia Complications (+) history of anesthetic complications  Airway Mallampati: III  TM Distance: <3 FB Neck ROM: limited    Dental  (+) Chipped, Poor Dentition, Missing, Implants   Pulmonary neg pulmonary ROS, neg shortness of breath,           Cardiovascular Exercise Tolerance: Good hypertension,      Neuro/Psych PSYCHIATRIC DISORDERS negative neurological ROS     GI/Hepatic Neg liver ROS, GERD  Medicated and Controlled,  Endo/Other  diabetes, Type 2  Renal/GU Renal disease     Musculoskeletal  (+) Arthritis ,   Abdominal   Peds  Hematology negative hematology ROS (+)   Anesthesia Other Findings Past Medical History: No date: Anxiety No date: Benign breast lumps No date: Blood clotting disorder (HCC) No date: Bone cancer ([place]) 07/27/2017: Breast cancer of upper-outer quadrant of left female  breast ([place])     Comment:  11 mm invasive mammary carcinoma, ER 90%, PR 51-90%,               negative margins.  Oncotype recurrence score: 1.  DCIS,               margin less than 0.5 mm.  Negative sentinel node.                Accelerated partial breast radiation. No date: Cervicalgia No date: Chronic kidney disease     Comment:  kidney stones No date: Colon cancer ([place]) No date: Complication of anesthesia     Comment:  MOOD ALTERATION / UNSURE IF ANES OR PAIN MED. NO TROUBLE              WITH MOH'S No date: Cough     Comment:  NASAL DRIP / SNEEZING / SORE THROAT MOSTLY CONSTANT No date: Depression No date: Diabetes ([place]) No date: Diverticulitis No date: Dizzy No date: GERD (gastroesophageal reflux disease) No date: HBP (high blood pressure) No date: Hematuria     Comment:  gross No date:  Hemorrhoids No date: HLD (hyperlipidemia) No date: Muscle pain No date: Osteoarthritis No date: Osteoporosis No date: Panic disorder No date: PND (post-nasal drip)     Comment:  CHRONIC WITH SORE THROAT AND SNEEZING No date: Reflux No date: Skin cancer     Comment:  nose No date: Swelling No date: Tremors of nervous system  Past Surgical History: 1962: APPENDECTOMY 2006?: BREAST BIOPSY; Right     Comment:  benign 07/27/2017: BREAST CYST EXCISION; Left     Comment:  Procedure: SKIN CYST EXCISED;  Surgeon: Robert Bellow, MD;  Location: [facility] ORS;  Service: General;                Laterality: Left; 07/2017: BREAST LUMPECTOMY; Left     Comment:  invasive mammary, DCIS  mammosite 2019: BREAST SURGERY; Right No date: CARPAL TUNNEL RELEASE; Right 11/11/2016: CATARACT EXTRACTION W/PHACO; Left     Comment:  Procedure: CATARACT EXTRACTION PHACO AND INTRAOCULAR               LENS PLACEMENT (IOC);  Surgeon: Birder Robson, MD;                Location: [facility] ORS;  Service: Ophthalmology;  Laterality:              Left;  [place] 01:07.9 AP% 19.2 CDE 13.02 Fluid pack lot #               [id number] 12/09/2016: CATARACT EXTRACTION W/PHACO; Right     Comment:  Procedure: CATARACT EXTRACTION PHACO AND INTRAOCULAR               LENS PLACEMENT (IOC);  Surgeon: Birder Robson, MD;                Location: [facility] ORS;  Service: Ophthalmology;  Laterality:              Right;  [place] 00:49 AP% 21.2 CDE 10.39 Fluid pack lot #               [id number] No date: CHOLECYSTECTOMY 11/22/2014: COLONOSCOPY WITH PROPOFOL; N/A     Comment:  Procedure: COLONOSCOPY WITH PROPOFOL;  Surgeon: Manya Silvas, MD;  Location: [facility] ENDOSCOPY;  Service:               Endoscopy;  Laterality: N/A; 11/22/2014: ESOPHAGOGASTRODUODENOSCOPY     Comment:  Procedure: ESOPHAGOGASTRODUODENOSCOPY (EGD);  Surgeon:               Manya Silvas, MD;  Location: [facility]COPY;                Service:  Endoscopy;; 03/05/2017: ESOPHAGOGASTRODUODENOSCOPY (EGD) WITH PROPOFOL; N/A     Comment:  Procedure: ESOPHAGOGASTRODUODENOSCOPY (EGD) WITH               PROPOFOL;  Surgeon: Lin Landsman, MD;  Location:               [facility] ENDOSCOPY;  Service: Gastroenterology;  Laterality:               N/A; 03/04/2018: EXTRACORPOREAL SHOCK WAVE LITHOTRIPSY; Right     Comment:  Procedure: EXTRACORPOREAL SHOCK WAVE LITHOTRIPSY (ESWL);              Surgeon: Billey Co, MD;  Location: [facility] ORS;                Service: Urology;  Laterality: Right; 2012: EYE SURGERY     Comment:  cataract extraction with iol 1968: GALLBLADDER SURGERY No date: hemi pelvectomy 1962: HEMIPELVIC; Left No date: LEG SURGERY; Left     Comment:  AMPUTATION 07/27/2017: MASTECTOMY, PARTIAL; Left     Comment:  Procedure: MASTECTOMY PARTIAL;  Surgeon: Robert Bellow, MD;  Location: [facility] ORS;  Service: General;                Laterality: Left; 2019: MOUTH SURGERY No date: OVARY SURGERY; Right 11/22/2014: SAVORY DILATION     Comment:  Procedure: SAVORY DILATION;  Surgeon: Manya Silvas,               MD;  Location: [facility] ENDOSCOPY;  Service: Endoscopy;; 07/27/2017: SENTINEL NODE BIOPSY; Left     Comment:  Procedure: SENTINEL NODE BIOPSY;  Surgeon: Robert Bellow, MD;  Location: [facility] ORS;  Service: General;                Laterality: Left; No date: SKIN CANCER EXCISION No date: TONSILLECTOMY  BMI  Body Mass Index: 30.83 kg/m      Reproductive/Obstetrics negative OB ROS                             Anesthesia Physical Anesthesia Plan  ASA: III  Anesthesia Plan: General LMA   Post-op Pain Management:    Induction: Intravenous  PONV Risk Score and Plan: Dexamethasone, Ondansetron, Midazolam and Treatment may vary due to age or medical condition  Airway Management Planned: LMA  Additional Equipment:   Intra-op Plan:   Post-operative Plan:  Extubation in OR  Informed Consent: I have reviewed the patients History and Physical, chart, labs and discussed the procedure including the risks, benefits and alternatives for the proposed anesthesia with the patient or authorized representative who has indicated his/her understanding and acceptance.     Dental Advisory Given  Plan Discussed with: Anesthesiologist, CRNA and Surgeon  Anesthesia Plan Comments: (Patient consented for risks of anesthesia including but not limited to:  - adverse reactions to medications - damage to teeth, lips or other oral mucosa - sore throat or hoarseness - Damage to heart, brain, lungs or loss of life  Patient voiced understanding.)        Anesthesia Quick Evaluation

## 2018-11-27 NOTE — Anesthesia Procedure Notes (Signed)
Procedure Name: LMA Insertion Date/Time: 11/27/2018 10:30 AM Performed by: Adalberto Ill, CRNA Pre-anesthesia Checklist: Patient identified, Emergency Drugs available, Suction available, Patient being monitored and Timeout performed Patient Re-evaluated:Patient Re-evaluated prior to induction Oxygen Delivery Method: Circle system utilized Preoxygenation: Pre-oxygenation with 100% oxygen Induction Type: IV induction LMA: LMA inserted LMA Size: 4.0 Number of attempts: 1 Placement Confirmation: positive ETCO2 and breath sounds checked- equal and bilateral ETT to lip (cm): yes. Tube secured with: Tape Dental Injury: Teeth and Oropharynx as per pre-operative assessment

## 2018-11-27 NOTE — Anesthesia Postprocedure Evaluation (Signed)
Anesthesia Post Note  Patient: Julie Acosta  Procedure(s) Performed: CYSTOSCOPY WITH STENT PLACEMENT (Bilateral )  Patient location during evaluation: PACU Anesthesia Type: General Level of consciousness: awake and alert Pain management: pain level controlled Vital Signs Assessment: post-procedure vital signs reviewed and stable Respiratory status: spontaneous breathing, nonlabored ventilation, respiratory function stable and patient connected to nasal cannula oxygen Cardiovascular status: blood pressure returned to baseline and stable Postop Assessment: no apparent nausea or vomiting Anesthetic complications: no     Last Vitals:  Vitals:   11/27/18 1136 11/27/18 1146  BP: 119/65 137/67  Pulse: 95 89  Resp: 19 13  Temp:  37.3 C  SpO2: 97% 98%    Last Pain:  Vitals:   11/27/18 1146  TempSrc:   PainSc: 0-No pain                 Precious Haws Lianni Kanaan

## 2018-11-27 NOTE — Discharge Summary (Signed)
Physician Discharge Summary  Patient ID: Julie Acosta MRN: [id number] DOB/AGE: 08-31-1933 85 y.o.  Admit date: 11/26/2018 Discharge date: 11/27/2018  Admission Diagnoses:  Discharge Diagnoses:  Active Problems:   Ureteral stone with hydronephrosis   Discharged Condition: good  Hospital Course: I am not certain Julie Acosta needs a discharge summary because she was observation but she was kept overnight in observation to have stents the next day to get them in semi-urgently if she had bilateral ureteral stones and left greater than right hydronephrosis.  It got late on the day she was admitted, so the stents were done today on the day of discharge.  She did well and remained stable and was discharged.  Consults: None  Significant Diagnostic Studies: none  Treatments: surgery: Cystoscopy, bilateral retrograde pyelogram, bilateral ureteral stent placement  Discharge Exam: Blood pressure (!) 116/55, pulse 93, temperature 98.2 F (36.8 C), temperature source Oral, resp. rate 19, height 5' (1.524 m), weight 71.6 kg, SpO2 96 %. Patient was noted in the recovery room to be sleepy but arousable.  She was in no acute distress.  She will go back to the hospital ward when she is stable and recovered from her anesthesia and then be discharged per protocol if she remains stable.  Disposition: Discharge disposition: 01-Home or Self Care       Discharge Instructions    Discharge patient   Complete by: As directed    Discharge disposition: 01-Home or Self Care   Discharge patient date: 11/27/2018     Allergies as of 11/27/2018      Reactions   Ciprofloxacin Hives   Codeine Other (See Comments)   HALLUCINATIONS   Tape    Rash and skin irritation/ paper tape and tegaderm OK   Ciprocinonide [fluocinolone] Other (See Comments)   unknown   Amoxicillin Other (See Comments)   Upset stomach Has patient had a PCN reaction causing immediate rash, facial/tongue/throat swelling, SOB or  lightheadedness with hypotension: No Has patient had a PCN reaction causing severe rash involving mucus membranes or skin necrosis: No Has patient had a PCN reaction that required hospitalization: No Has patient had a PCN reaction occurring within the last 10 years: Yes If all of the above answers are "NO", then may proceed with Cephalosporin use.;   Cefuroxime Other (See Comments)   OK INTRACAMERALLY PER DR WLP, upset stomach   Lipitor [atorvastatin] Other (See Comments)   unknown      Medication List    TAKE these medications   fenofibrate 160 MG tablet Take 160 mg by mouth daily.   glipiZIDE XL 2.5 MG 24 hr tablet Generic drug: glipiZIDE Take 2.5 mg by mouth daily.   letrozole 2.5 MG tablet Commonly known as: FEMARA Take 1 tablet (2.5 mg total) by mouth daily.   losartan 50 MG tablet Commonly known as: COZAAR Take 50 mg by mouth daily.   metFORMIN 500 MG tablet Commonly known as: GLUCOPHAGE Take 1,000 mg by mouth 2 (two) times daily.   metoprolol succinate 25 MG 24 hr tablet Commonly known as: TOPROL-XL Take 12.5 mg by mouth at bedtime.   omeprazole 40 MG capsule Commonly known as: PRILOSEC Take 40 mg by mouth daily.   PARoxetine 40 MG tablet Commonly known as: PAXIL Take 40 mg by mouth daily.   raloxifene 60 MG tablet Commonly known as: EVISTA Take 60 mg by mouth daily.   rivaroxaban 20 MG Tabs tablet Commonly known as: XARELTO Take 20 mg by mouth daily with supper.  Follow-up Information    Billey Co, MD. Call.   Specialty: Urology Why: Call Dr. Diamantina Acosta to have stents/stones removed. Contact information: [place] [address] [phone]           Signed: Festus Aloe 11/27/2018, 9:32 PM

## 2018-11-28 ENCOUNTER — Encounter: Payer: Self-pay | Admitting: Urology

## 2018-11-28 LAB — CULTURE, URINE COMPREHENSIVE

## 2018-11-29 ENCOUNTER — Encounter: Payer: Self-pay | Admitting: General Surgery

## 2018-11-29 ENCOUNTER — Other Ambulatory Visit: Payer: Self-pay | Admitting: Radiology

## 2018-11-29 DIAGNOSIS — N200 Calculus of kidney: Secondary | ICD-10-CM

## 2018-12-02 ENCOUNTER — Telehealth: Payer: Self-pay | Admitting: Urology

## 2018-12-02 NOTE — Telephone Encounter (Signed)
Lana at [facility] called and would like a call back to discuss pt's upcoming surgery and procedures. She can be reached at [phone] or [phone]. Please advise.

## 2018-12-03 DIAGNOSIS — Z960 Presence of urogenital implants: Secondary | ICD-10-CM | POA: Diagnosis not present

## 2018-12-03 DIAGNOSIS — N200 Calculus of kidney: Secondary | ICD-10-CM | POA: Diagnosis not present

## 2018-12-03 NOTE — Telephone Encounter (Signed)
Spoke with Julie Acosta at [facility] and she wanted to make sure patient did not need any prep or pre-op labs prior to surgery. I informed her that pre-admit would contact for pre-registration and she would need Covid test 72hours prior and need to quarantine after testing. She states that they can covid test there and wants to know if that is ok and if so will need an order.  Is pre-admit/OR ok with this?

## 2018-12-07 ENCOUNTER — Other Ambulatory Visit: Payer: Self-pay | Admitting: Radiology

## 2018-12-07 DIAGNOSIS — C50919 Malignant neoplasm of unspecified site of unspecified female breast: Secondary | ICD-10-CM

## 2018-12-07 DIAGNOSIS — F39 Unspecified mood [affective] disorder: Secondary | ICD-10-CM

## 2018-12-07 DIAGNOSIS — Z89612 Acquired absence of left leg above knee: Secondary | ICD-10-CM | POA: Diagnosis not present

## 2018-12-07 DIAGNOSIS — N201 Calculus of ureter: Secondary | ICD-10-CM | POA: Diagnosis not present

## 2018-12-07 DIAGNOSIS — Z01818 Encounter for other preprocedural examination: Secondary | ICD-10-CM

## 2018-12-07 DIAGNOSIS — N200 Calculus of kidney: Secondary | ICD-10-CM

## 2018-12-07 DIAGNOSIS — R530 Neoplastic (malignant) related fatigue: Secondary | ICD-10-CM | POA: Diagnosis not present

## 2018-12-07 DIAGNOSIS — E119 Type 2 diabetes mellitus without complications: Secondary | ICD-10-CM | POA: Diagnosis not present

## 2018-12-08 ENCOUNTER — Other Ambulatory Visit: Payer: Medicare Other

## 2018-12-10 ENCOUNTER — Other Ambulatory Visit: Payer: Self-pay | Admitting: Radiology

## 2018-12-10 ENCOUNTER — Encounter
Admission: RE | Admit: 2018-12-10 | Discharge: 2018-12-10 | Disposition: A | Discharge status: Home / Self Care | Payer: Medicare Other | Source: Ambulatory Visit | Attending: Urology | Admitting: Urology

## 2018-12-10 ENCOUNTER — Other Ambulatory Visit: Payer: Self-pay

## 2018-12-10 ENCOUNTER — Encounter: Payer: Self-pay | Admitting: *Deleted

## 2018-12-10 NOTE — Patient Instructions (Signed)
Your procedure is scheduled on: 12/17/2018 Fri Report to Same Day Surgery 2nd floor medical mall [facility] Entrance-take elevator on left to 2nd floor.  Check in with surgery information desk.) To find out your arrival time please call [phone] between 1PM - 3PM on 12/16/2018 Thurs  Remember: Instructions that are not followed completely may result in serious medical risk, up to and including death, or upon the discretion of your surgeon and anesthesiologist your surgery may need to be rescheduled.    _x___ 1. Do not eat food after midnight the night before your procedure. You may drink clear liquids up to 2 hours before you are scheduled to arrive at the hospital for your procedure.  Do not drink clear liquids within 2 hours of your scheduled arrival to the hospital.  Clear liquids include  --Water or Apple juice without pulp  --Clear carbohydrate beverage such as ClearFast or Gatorade  --Black Coffee or Clear Tea (No milk, no creamers, do not add anything to                  the coffee or Tea Type 1 and type 2 diabetics should only drink water.   ____Ensure clear carbohydrate drink on the way to the hospital for bariatric patients  ____Ensure clear carbohydrate drink 3 hours before surgery.   No gum chewing or hard candies.     __x__ 2. No Alcohol for 24 hours before or after surgery.   __x__3. No Smoking or e-cigarettes for 24 prior to surgery.  Do not use any chewable tobacco products for at least 6 hour prior to surgery   ____  4. Bring all medications with you on the day of surgery if instructed.    __x__ 5. Notify your doctor if there is any change in your medical condition     (cold, fever, infections).    x___6. On the morning of surgery brush your teeth with toothpaste and water.  You may rinse your mouth with mouth wash if you wish.  Do not swallow any toothpaste or mouthwash.   Do not wear jewelry, make-up, hairpins, clips or nail polish.  Do not wear lotions,  powders, or perfumes. You may wear deodorant.  Do not shave 48 hours prior to surgery. Men may shave face and neck.  Do not bring valuables to the hospital.    [facility] is not responsible for any belongings or valuables.               Contacts, dentures or bridgework may not be worn into surgery.  Leave your suitcase in the car. After surgery it may be brought to your room.  For patients admitted to the hospital, discharge time is determined by your                       treatment team.  _  Patients discharged the day of surgery will not be allowed to drive home.  You will need someone to drive you home and stay with you the night of your procedure.    Please read over the following fact sheets that you were given:   [facility] Preparing for Surgery and or MRSA Information   _x___ Take anti-hypertensive listed below, cardiac, seizure, asthma,     anti-reflux and psychiatric medicines. These include:  1. fenofibrate 160 MG tablet  2.letrozole (FEMARA) 2.5 MG tablet  3.metoprolol succinate (TOPROL-XL) 25 MG 24 hr tablet  4.omeprazole (PRILOSEC) 40 MG capsule  5.PARoxetine (PAXIL) 40 MG tablet  6.raloxifene (EVISTA) 60 MG tablet  ____Fleets enema or Magnesium Citrate as directed.   ____ Use CHG Soap or sage wipes as directed on instruction sheet   ____ Use inhalers on the day of surgery and bring to hospital day of surgery  _x__ Stop Metformin and Janumet 2 days prior to surgery.    ____ Take 1/2 of usual insulin dose the night before surgery and none on the morning     surgery.   _x___ Follow recommendations from Cardiologist, Pulmonologist or PCP regarding          stopping Aspirin, Coumadin, Plavix ,Eliquis, Effient, or Pradaxa, and Pletal.  X____Stop Anti-inflammatories such as Advil, Aleve, Ibuprofen, Motrin, Naproxen, Naprosyn, Goodies powders or aspirin products. OK to take Tylenol and                          Celebrex.   _x___ Stop supplements until after surgery.  But  may continue Vitamin D, Vitamin B,       and multivitamin.   ____ Bring C-Pap to the hospital.

## 2018-12-13 ENCOUNTER — Telehealth: Payer: Self-pay | Admitting: Radiology

## 2018-12-13 DIAGNOSIS — N201 Calculus of ureter: Secondary | ICD-10-CM

## 2018-12-13 DIAGNOSIS — N390 Urinary tract infection, site not specified: Secondary | ICD-10-CM

## 2018-12-13 DIAGNOSIS — N200 Calculus of kidney: Secondary | ICD-10-CM

## 2018-12-13 MED ORDER — SULFAMETHOXAZOLE-TRIMETHOPRIM 800-160 MG PO TABS: 1.0000 | ORAL_TABLET | Freq: Two times a day (BID) | ORAL | 0 refills | Status: DC

## 2018-12-13 NOTE — Telephone Encounter (Signed)
Notified daughter, Ledell Noss, of bacteria in urine culture and script sent to pharmacy.

## 2018-12-13 NOTE — Telephone Encounter (Signed)
-----   Message from Billey Co, MD sent at 12/13/2018 12:58 PM EDT ----- Regarding: abx pre-op Please start her on Bactrim DS BID x3 days starting Tuesday 8/11.  Thanks Nickolas Madrid, MD 12/13/2018  ----- Message ----- From: Ranell Patrick, RN Sent: 12/13/2018   9:44 AM EDT To: Billey Co, MD  ua & ucx results. Surgery 12/17/2018.

## 2018-12-15 ENCOUNTER — Other Ambulatory Visit: Payer: Medicare Other | Admitting: Urology

## 2018-12-16 MED ORDER — SODIUM CHLORIDE 0.9 % IV SOLN
2.0000 g | INTRAVENOUS | Status: AC
  Administered 2018-12-17: 2 g via INTRAVENOUS
  Filled 2018-12-16: qty 2

## 2018-12-17 ENCOUNTER — Ambulatory Visit
Admission: RE | Admit: 2018-12-17 | Discharge: 2018-12-17 | Disposition: A | Discharge status: Home / Self Care | Payer: Medicare Other | Attending: Urology | Admitting: Urology

## 2018-12-17 ENCOUNTER — Other Ambulatory Visit
Admission: RE | Admit: 2018-12-17 | Discharge: 2018-12-17 | Disposition: A | Discharge status: Home / Self Care | Payer: Medicare Other | Source: Ambulatory Visit | Attending: Urology | Admitting: Urology

## 2018-12-17 ENCOUNTER — Ambulatory Visit: Payer: Medicare Other | Admitting: Certified Registered Nurse Anesthetist

## 2018-12-17 ENCOUNTER — Encounter
Admission: RE | Disposition: A | Discharge status: Home / Self Care | Payer: Self-pay | Source: Home / Self Care | Attending: Urology

## 2018-12-17 ENCOUNTER — Encounter: Payer: Self-pay | Admitting: *Deleted

## 2018-12-17 ENCOUNTER — Ambulatory Visit: Payer: Medicare Other

## 2018-12-17 ENCOUNTER — Other Ambulatory Visit: Payer: Self-pay

## 2018-12-17 DIAGNOSIS — Z20828 Contact with and (suspected) exposure to other viral communicable diseases: Secondary | ICD-10-CM | POA: Insufficient documentation

## 2018-12-17 DIAGNOSIS — E1122 Type 2 diabetes mellitus with diabetic chronic kidney disease: Secondary | ICD-10-CM | POA: Insufficient documentation

## 2018-12-17 DIAGNOSIS — Z9841 Cataract extraction status, right eye: Secondary | ICD-10-CM | POA: Insufficient documentation

## 2018-12-17 DIAGNOSIS — Z923 Personal history of irradiation: Secondary | ICD-10-CM | POA: Diagnosis not present

## 2018-12-17 DIAGNOSIS — Z85038 Personal history of other malignant neoplasm of large intestine: Secondary | ICD-10-CM | POA: Insufficient documentation

## 2018-12-17 DIAGNOSIS — M1990 Unspecified osteoarthritis, unspecified site: Secondary | ICD-10-CM | POA: Insufficient documentation

## 2018-12-17 DIAGNOSIS — Z853 Personal history of malignant neoplasm of breast: Secondary | ICD-10-CM | POA: Insufficient documentation

## 2018-12-17 DIAGNOSIS — N189 Chronic kidney disease, unspecified: Secondary | ICD-10-CM | POA: Insufficient documentation

## 2018-12-17 DIAGNOSIS — Z9842 Cataract extraction status, left eye: Secondary | ICD-10-CM | POA: Diagnosis not present

## 2018-12-17 DIAGNOSIS — N201 Calculus of ureter: Secondary | ICD-10-CM | POA: Diagnosis present

## 2018-12-17 DIAGNOSIS — Z85828 Personal history of other malignant neoplasm of skin: Secondary | ICD-10-CM | POA: Diagnosis not present

## 2018-12-17 DIAGNOSIS — Z961 Presence of intraocular lens: Secondary | ICD-10-CM | POA: Insufficient documentation

## 2018-12-17 DIAGNOSIS — I129 Hypertensive chronic kidney disease with stage 1 through stage 4 chronic kidney disease, or unspecified chronic kidney disease: Secondary | ICD-10-CM | POA: Diagnosis not present

## 2018-12-17 DIAGNOSIS — N200 Calculus of kidney: Secondary | ICD-10-CM

## 2018-12-17 HISTORY — DX: Personal history of other venous thrombosis and embolism: Z86.718

## 2018-12-17 HISTORY — PX: CYSTOSCOPY/URETEROSCOPY/HOLMIUM LASER/STENT PLACEMENT: SHX6546

## 2018-12-17 HISTORY — DX: Tremor, unspecified: R25.1

## 2018-12-17 LAB — GLUCOSE, CAPILLARY
Glucose-Capillary: 156 mg/dL — ABNORMAL HIGH (ref 70–99)
Glucose-Capillary: 105 mg/dL — ABNORMAL HIGH (ref 70–99)
Glucose-Capillary: 154 mg/dL — ABNORMAL HIGH (ref 70–99)

## 2018-12-17 LAB — SARS CORONAVIRUS 2 BY RT PCR (HOSPITAL ORDER, PERFORMED IN ~~LOC~~ HOSPITAL LAB): SARS Coronavirus 2: NEGATIVE

## 2018-12-17 SURGERY — CYSTOSCOPY/URETEROSCOPY/HOLMIUM LASER/STENT PLACEMENT
Anesthesia: General | Laterality: Bilateral

## 2018-12-17 MED ORDER — DEXAMETHASONE SODIUM PHOSPHATE 10 MG/ML IJ SOLN
INTRAMUSCULAR | Status: DC | PRN
  Administered 2018-12-17: 4 mg via INTRAVENOUS

## 2018-12-17 MED ORDER — PROPOFOL 10 MG/ML IV BOLUS
INTRAVENOUS | Status: DC | PRN
  Administered 2018-12-17: 90 mg via INTRAVENOUS

## 2018-12-17 MED ORDER — PHENYLEPHRINE HCL (PRESSORS) 10 MG/ML IV SOLN
INTRAVENOUS | Status: DC | PRN
  Administered 2018-12-17: 100 ug via INTRAVENOUS
  Administered 2018-12-17: 200 ug via INTRAVENOUS
  Administered 2018-12-17: 100 ug via INTRAVENOUS

## 2018-12-17 MED ORDER — IPRATROPIUM-ALBUTEROL 0.5-2.5 (3) MG/3ML IN SOLN
3.0000 mL | Freq: Once | RESPIRATORY_TRACT | Status: AC
  Administered 2018-12-17: 16:00:00 3 mL via RESPIRATORY_TRACT

## 2018-12-17 MED ORDER — SODIUM CHLORIDE 0.9 % IV SOLN
INTRAVENOUS | Status: DC | PRN
  Administered 2018-12-17: 75 ug/min via INTRAVENOUS

## 2018-12-17 MED ORDER — ROCURONIUM BROMIDE 100 MG/10ML IV SOLN
INTRAVENOUS | Status: DC | PRN
  Administered 2018-12-17: 40 mg via INTRAVENOUS
  Administered 2018-12-17: 10 mg via INTRAVENOUS

## 2018-12-17 MED ORDER — EPHEDRINE SULFATE 50 MG/ML IJ SOLN
INTRAMUSCULAR | Status: DC | PRN
  Administered 2018-12-17 (×2): 10 mg via INTRAVENOUS

## 2018-12-17 MED ORDER — SULFAMETHOXAZOLE-TRIMETHOPRIM 800-160 MG PO TABS: 1.0000 | ORAL_TABLET | Freq: Every day | ORAL | 0 refills | Status: DC

## 2018-12-17 MED ORDER — ONDANSETRON HCL 4 MG/2ML IJ SOLN
INTRAMUSCULAR | Status: DC | PRN
  Administered 2018-12-17: 4 mg via INTRAVENOUS

## 2018-12-17 MED ORDER — IPRATROPIUM-ALBUTEROL 0.5-2.5 (3) MG/3ML IN SOLN
RESPIRATORY_TRACT | Status: AC
  Administered 2018-12-17: 3 mL via RESPIRATORY_TRACT
  Filled 2018-12-17: qty 3

## 2018-12-17 MED ORDER — IOHEXOL 180 MG/ML  SOLN
INTRAMUSCULAR | Status: DC | PRN
  Administered 2018-12-17: 20 mL

## 2018-12-17 MED ORDER — FENTANYL CITRATE (PF) 100 MCG/2ML IJ SOLN
INTRAMUSCULAR | Status: AC
  Filled 2018-12-17: qty 2

## 2018-12-17 MED ORDER — FENTANYL CITRATE (PF) 100 MCG/2ML IJ SOLN
INTRAMUSCULAR | Status: DC | PRN
  Administered 2018-12-17: 50 ug via INTRAVENOUS

## 2018-12-17 MED ORDER — LACTATED RINGERS IV SOLN
INTRAVENOUS | Status: DC | PRN
  Administered 2018-12-17: 14:00:00 via INTRAVENOUS

## 2018-12-17 MED ORDER — LIDOCAINE HCL (CARDIAC) PF 100 MG/5ML IV SOSY
PREFILLED_SYRINGE | INTRAVENOUS | Status: DC | PRN
  Administered 2018-12-17: 80 mg via INTRAVENOUS

## 2018-12-17 MED ORDER — SODIUM CHLORIDE 0.9 % IV SOLN
INTRAVENOUS | Status: DC
  Administered 2018-12-17: 20 mL/h via INTRAVENOUS

## 2018-12-17 MED ORDER — FENTANYL CITRATE (PF) 100 MCG/2ML IJ SOLN: 25.0000 ug | INTRAMUSCULAR | Status: DC | PRN

## 2018-12-17 MED ORDER — ACETAMINOPHEN 10 MG/ML IV SOLN
INTRAVENOUS | Status: DC | PRN
  Administered 2018-12-17: 1000 mg via INTRAVENOUS

## 2018-12-17 MED ORDER — ONDANSETRON HCL 4 MG/2ML IJ SOLN: 4.0000 mg | Freq: Once | INTRAMUSCULAR | Status: DC | PRN

## 2018-12-17 SURGICAL SUPPLY — 29 items
BAG DRAIN CYSTO-URO LG1000N (MISCELLANEOUS) ×2 IMPLANT
BRUSH SCRUB EZ 1% IODOPHOR (MISCELLANEOUS) ×2 IMPLANT
CATH URETL 5X70 OPEN END (CATHETERS) IMPLANT
CNTNR SPEC 2.5X3XGRAD LEK (MISCELLANEOUS)
CONT SPEC 4OZ STER OR WHT (MISCELLANEOUS)
CONTAINER SPEC 2.5X3XGRAD LEK (MISCELLANEOUS) IMPLANT
DRAPE UTILITY 15X26 TOWEL STRL (DRAPES) ×2 IMPLANT
FIBER LASER TRAC TIP (UROLOGICAL SUPPLIES) ×1 IMPLANT
GLOVE BIOGEL PI IND STRL 7.5 (GLOVE) ×1 IMPLANT
GLOVE BIOGEL PI INDICATOR 7.5 (GLOVE) ×1
GOWN STRL REUS W/ TWL LRG LVL3 (GOWN DISPOSABLE) ×1 IMPLANT
GOWN STRL REUS W/ TWL XL LVL3 (GOWN DISPOSABLE) ×1 IMPLANT
GOWN STRL REUS W/TWL LRG LVL3 (GOWN DISPOSABLE) ×1
GOWN STRL REUS W/TWL XL LVL3 (GOWN DISPOSABLE) ×1
GUIDEWIRE STR DUAL SENSOR (WIRE) ×3 IMPLANT
INFUSOR MANOMETER BAG 3000ML (MISCELLANEOUS) ×2 IMPLANT
INTRODUCER DILATOR DOUBLE (INTRODUCER) IMPLANT
KIT TURNOVER CYSTO (KITS) ×2 IMPLANT
PACK CYSTO AR (MISCELLANEOUS) ×2 IMPLANT
SET CYSTO W/LG BORE CLAMP LF (SET/KITS/TRAYS/PACK) ×2 IMPLANT
SHEATH URETERAL 12FRX35CM (MISCELLANEOUS) IMPLANT
SOL .9 NS 3000ML IRR  AL (IV SOLUTION) ×1
SOL .9 NS 3000ML IRR UROMATIC (IV SOLUTION) ×1 IMPLANT
STENT URET 6FRX24 CONTOUR (STENTS) ×1 IMPLANT
STENT URET 6FRX26 CONTOUR (STENTS) ×1 IMPLANT
SURGILUBE 2OZ TUBE FLIPTOP (MISCELLANEOUS) ×2 IMPLANT
SYR 10ML LL (SYRINGE) ×2 IMPLANT
VALVE UROSEAL ADJ ENDO (VALVE) IMPLANT
WATER STERILE IRR 1000ML POUR (IV SOLUTION) ×2 IMPLANT

## 2018-12-17 NOTE — Anesthesia Procedure Notes (Signed)
Procedure Name: Intubation Date/Time: 12/17/2018 1:41 PM Performed by: Lowry Bowl, CRNA Pre-anesthesia Checklist: Patient identified, Emergency Drugs available, Suction available and Patient being monitored Patient Re-evaluated:Patient Re-evaluated prior to induction Oxygen Delivery Method: Circle system utilized Preoxygenation: Pre-oxygenation with 100% oxygen Induction Type: IV induction Ventilation: Mask ventilation without difficulty Laryngoscope Size: Mac and 3 Grade View: Grade II Tube type: Oral Tube size: 7.0 mm Number of attempts: 1 Airway Equipment and Method: Stylet Placement Confirmation: ETT inserted through vocal cords under direct vision,  positive ETCO2 and breath sounds checked- equal and bilateral Secured at: 21 cm Tube secured with: Tape Dental Injury: Teeth and Oropharynx as per pre-operative assessment

## 2018-12-17 NOTE — Op Note (Signed)
Date of procedure: 12/17/18  Preoperative diagnosis:  1. Bilateral ureteral stones, pre-stented  Postoperative diagnosis:  1. Same  Procedure: 1. Cystoscopy, bilateral ureteroscopy, laser lithotripsy, bilateral retrograde pyelogram with intraoperative interpretation, bilateral ureteral stent placement  Surgeon: Nickolas Madrid, MD  Anesthesia: General  Complications: None  Intraoperative findings:  1.  Significant distal ureteral stone burden bilaterally, fragmented to dust and irrigated free from ureter 2.  Uncomplicated bilateral ureteral stent placement  EBL: Minimal  Specimens: None  Drains:  Right 6 French by 26 cm ureteral stent Left 6 French by 24 cm ureteral stent  Indication: Julie Acosta is a 85 y.o. patient with history of bilateral ureteral stones that underwent urgent ureteral stent placement 2 weeks ago.  She had a possible infection at that time with nitrite positive urine, however urine culture was ultimately negative.  After reviewing the management options for treatment, they elected to proceed with the above surgical procedure(s). We have discussed the potential benefits and risks of the procedure, side effects of the proposed treatment, the likelihood of the patient achieving the goals of the procedure, and any potential problems that might occur during the procedure or recuperation. Informed consent has been obtained.  Description of procedure:  The patient was taken to the operating room and general anesthesia was induced. SCD was placed for DVT prophylaxis. The patient was placed in the modified dorsal lithotomy position in the setting of her prior left amputation, prepped and draped in the usual sterile fashion, and preoperative antibiotics were administered. A preoperative time-out was performed.   A 21 French rigid cystoscope with a 30 degree lens was used to intubate the urethra.  Cystoscopy was performed and was normal.  On fluoroscopy, bilateral stents  appeared in appropriate position.  A sensor wire was advanced along the right ureteral stent up to the kidney under fluoroscopic vision, and the right ureteral stent removed.  A semirigid ureteroscope was advanced alongside the wire and significant 1cm stone burden was noted in the distal and mid ureter.  This was all fragmented to dust with a 200 m laser fiber on settings of 1.0 J and 10 Hz.  All fragments were irrigated free from the ureter.  Thorough ureteroscopy demonstrated no residual fragments or ureteral injury.  Contrast was injected through the scope for a retrograde pyelogram that showed no filling defects or extravasation.  The cystoscope was backloaded over the wire, and a 6 [place] by 26 cm stent was uneventfully placed with an excellent curl in the renal pelvis, as well as under direct vision the bladder.  I then turned my attention to the left side and a sensor wire was again advanced alongside the left ureteral stent.  The stent was grasped and removed.  A semirigid ureteroscope was advanced over the wire on the left side and again a significant 1 cm stone burden was noted in the mid ureter and fragmented to dust with the 200 m laser fiber.  There was one additional stone lodged in the mid ureter just out of reach of the semirigid ureteroscope, and I elected to perform flexible ureteroscopy.  A second safety wire was added through the semirigid ureteroscope, and the flexible ureteroscope was then advanced over the wire up to the stone.  The stone is fragmented to dust on the aforementioned settings.  Thorough pyeloscopy revealed no other ureteral or renal fragments.  Contrast injected through the scope for a retrograde pyelogram showed no filling defects or extravasation.  Careful pullback ureteroscopy demonstrated no  abnormalities or residual fragments.  A 6 French by 24 cm stent was then uneventfully placed under direct vision with an excellent curl in the renal pelvis as well as under direct  vision the bladder.  The bladder was drained and this concluded the procedure.  Disposition: Stable to PACU  Plan: Bactrim prophylaxis while stent in place Follow-up in 10 to 14 days for bilateral ureteral stent removal in clinic  Nickolas Madrid, MD

## 2018-12-17 NOTE — Anesthesia Preprocedure Evaluation (Signed)
Anesthesia Evaluation  Patient identified by MRN, date of birth, ID band Patient awake    Reviewed: Allergy & Precautions, H&P , NPO status , Patient's Chart, lab work & pertinent test results  History of Anesthesia Complications (+) history of anesthetic complications  Airway Mallampati: III  TM Distance: <3 FB Neck ROM: limited    Dental  (+) Chipped, Poor Dentition, Missing, Implants   Pulmonary neg pulmonary ROS, neg shortness of breath,           Cardiovascular Exercise Tolerance: Good hypertension,      Neuro/Psych PSYCHIATRIC DISORDERS negative neurological ROS     GI/Hepatic Neg liver ROS, GERD  Medicated and Controlled,  Endo/Other  diabetes, Type 2  Renal/GU Renal disease     Musculoskeletal  (+) Arthritis ,   Abdominal   Peds  Hematology negative hematology ROS (+)   Anesthesia Other Findings Past Medical History: No date: Anxiety No date: Benign breast lumps No date: Blood clotting disorder (HCC) No date: Bone cancer ([place]) 07/27/2017: Breast cancer of upper-outer quadrant of left female  breast ([place])     Comment:  11 mm invasive mammary carcinoma, ER 90%, PR 51-90%,               negative margins.  Oncotype recurrence score: 1.  DCIS,               margin less than 0.5 mm.  Negative sentinel node.                Accelerated partial breast radiation. No date: Cervicalgia No date: Chronic kidney disease     Comment:  kidney stones No date: Colon cancer ([place]) No date: Complication of anesthesia     Comment:  MOOD ALTERATION / UNSURE IF ANES OR PAIN MED. NO TROUBLE              WITH MOH'S No date: Cough     Comment:  NASAL DRIP / SNEEZING / SORE THROAT MOSTLY CONSTANT No date: Depression No date: Diabetes ([place]) No date: Diverticulitis No date: Dizzy No date: GERD (gastroesophageal reflux disease) No date: HBP (high blood pressure) No date: Hematuria     Comment:  gross No date:  Hemorrhoids No date: HLD (hyperlipidemia) No date: Muscle pain No date: Osteoarthritis No date: Osteoporosis No date: Panic disorder No date: PND (post-nasal drip)     Comment:  CHRONIC WITH SORE THROAT AND SNEEZING No date: Reflux No date: Skin cancer     Comment:  nose No date: Swelling No date: Tremors of nervous system  Past Surgical History: 1962: APPENDECTOMY 2006?: BREAST BIOPSY; Right     Comment:  benign 07/27/2017: BREAST CYST EXCISION; Left     Comment:  Procedure: SKIN CYST EXCISED;  Surgeon: Robert Bellow, MD;  Location: [facility] ORS;  Service: General;                Laterality: Left; 07/2017: BREAST LUMPECTOMY; Left     Comment:  invasive mammary, DCIS  mammosite 2019: BREAST SURGERY; Right No date: CARPAL TUNNEL RELEASE; Right 11/11/2016: CATARACT EXTRACTION W/PHACO; Left     Comment:  Procedure: CATARACT EXTRACTION PHACO AND INTRAOCULAR               LENS PLACEMENT (IOC);  Surgeon: Birder Robson, MD;                Location: [facility] ORS;  Service: Ophthalmology;  Laterality:              Left;  [place] 01:07.9 AP% 19.2 CDE 13.02 Fluid pack lot #               [id number] 12/09/2016: CATARACT EXTRACTION W/PHACO; Right     Comment:  Procedure: CATARACT EXTRACTION PHACO AND INTRAOCULAR               LENS PLACEMENT (IOC);  Surgeon: Birder Robson, MD;                Location: [facility] ORS;  Service: Ophthalmology;  Laterality:              Right;  [place] 00:49 AP% 21.2 CDE 10.39 Fluid pack lot #               [id number] No date: CHOLECYSTECTOMY 11/22/2014: COLONOSCOPY WITH PROPOFOL; N/A     Comment:  Procedure: COLONOSCOPY WITH PROPOFOL;  Surgeon: Manya Silvas, MD;  Location: [facility] ENDOSCOPY;  Service:               Endoscopy;  Laterality: N/A; 11/22/2014: ESOPHAGOGASTRODUODENOSCOPY     Comment:  Procedure: ESOPHAGOGASTRODUODENOSCOPY (EGD);  Surgeon:               Manya Silvas, MD;  Location: [facility] ENDOSCOPY;                Service:  Endoscopy;; 03/05/2017: ESOPHAGOGASTRODUODENOSCOPY (EGD) WITH PROPOFOL; N/A     Comment:  Procedure: ESOPHAGOGASTRODUODENOSCOPY (EGD) WITH               PROPOFOL;  Surgeon: Lin Landsman, MD;  Location:               [facility] ENDOSCOPY;  Service: Gastroenterology;  Laterality:               N/A; 03/04/2018: EXTRACORPOREAL SHOCK WAVE LITHOTRIPSY; Right     Comment:  Procedure: EXTRACORPOREAL SHOCK WAVE LITHOTRIPSY (ESWL);              Surgeon: Billey Co, MD;  Location: [facility] ORS;                Service: Urology;  Laterality: Right; 2012: EYE SURGERY     Comment:  cataract extraction with iol 1968: GALLBLADDER SURGERY No date: hemi pelvectomy 1962: HEMIPELVIC; Left No date: LEG SURGERY; Left     Comment:  AMPUTATION 07/27/2017: MASTECTOMY, PARTIAL; Left     Comment:  Procedure: MASTECTOMY PARTIAL;  Surgeon: Robert Bellow, MD;  Location: [facility] ORS;  Service: General;                Laterality: Left; 2019: MOUTH SURGERY No date: OVARY SURGERY; Right 11/22/2014: SAVORY DILATION     Comment:  Procedure: SAVORY DILATION;  Surgeon: Manya Silvas,               MD;  Location: [facility] ENDOSCOPY;  Service: Endoscopy;; 07/27/2017: SENTINEL NODE BIOPSY; Left     Comment:  Procedure: SENTINEL NODE BIOPSY;  Surgeon: Robert Bellow, MD;  Location: [facility] ORS;  Service: General;                Laterality: Left; No date: SKIN CANCER EXCISION No date: TONSILLECTOMY  BMI  Body Mass Index: 30.83 kg/m      Reproductive/Obstetrics negative OB ROS                             Anesthesia Physical  Anesthesia Plan  ASA: III  Anesthesia Plan: General LMA   Post-op Pain Management:    Induction: Intravenous  PONV Risk Score and Plan: Dexamethasone, Ondansetron, Midazolam and Treatment may vary due to age or medical condition  Airway Management Planned: Oral ETT  Additional Equipment:   Intra-op Plan:   Post-operative  Plan: Extubation in OR  Informed Consent: I have reviewed the patients History and Physical, chart, labs and discussed the procedure including the risks, benefits and alternatives for the proposed anesthesia with the patient or authorized representative who has indicated his/her understanding and acceptance.     Dental Advisory Given  Plan Discussed with: Anesthesiologist, CRNA and Surgeon  Anesthesia Plan Comments: (Patient consented for risks of anesthesia including but not limited to:  - adverse reactions to medications - damage to teeth, lips or other oral mucosa - sore throat or hoarseness - Damage to heart, brain, lungs or loss of life  Patient voiced understanding.)        Anesthesia Quick Evaluation

## 2018-12-17 NOTE — Transfer of Care (Signed)
Immediate Anesthesia Transfer of Care Note  Patient: Julie Acosta  Procedure(s) Performed: CYSTOSCOPY/URETEROSCOPY/HOLMIUM LASER/STENT Exchange (Bilateral )  Patient Location: PACU  Anesthesia Type:General  Level of Consciousness: sedated, drowsy and patient cooperative  Airway & Oxygen Therapy: Patient Spontanous Breathing and Patient connected to face mask oxygen  Post-op Assessment: Report given to RN and Post -op Vital signs reviewed and stable  Post vital signs: Reviewed and stable  Last Vitals:  Vitals Value Taken Time  BP 119/46 12/17/18 1458  Temp 36.8 C 12/17/18 1458  Pulse 81 12/17/18 1459  Resp 19 12/17/18 1459  SpO2 99 % 12/17/18 1459  Vitals shown include unvalidated device data.  Last Pain:  Vitals:   12/17/18 1458  TempSrc:   PainSc: Asleep      Patients Stated Pain Goal: 0 (18/20/99 0689)  Complications: No apparent anesthesia complications

## 2018-12-17 NOTE — OR Nursing (Signed)
D/c was pending ride arrival

## 2018-12-17 NOTE — H&P (Signed)
UROLOGY H&P UPDATE  Agree with prior H&P dated 11/26/18, patient with bilateral distal ureteral stones s/p bilateral ureteral stents with Dr. Junious Silk on 11/27/18.  Cardiac: RRR Lungs: CTA bilaterally  Laterality: Bilateral Procedure: Bilateral ureteroscopy, laser lithotripsy, stent placement  Urine: 8/7 <10k mixed flora  Informed consent obtained, we specifically discussed the risks of bleeding, infection, post-operative pain, need for additional procedures, stent symptoms, need for stent removal in the future.  Billey Co, MD 12/17/2018

## 2018-12-17 NOTE — Anesthesia Post-op Follow-up Note (Signed)
Anesthesia QCDR form completed.        

## 2018-12-17 NOTE — Discharge Instructions (Signed)

## 2018-12-17 NOTE — Anesthesia Postprocedure Evaluation (Signed)
Anesthesia Post Note  Patient: Julie Acosta  Procedure(s) Performed: CYSTOSCOPY/URETEROSCOPY/HOLMIUM LASER/STENT Exchange (Bilateral )  Patient location during evaluation: PACU Anesthesia Type: General Level of consciousness: awake and alert and oriented Pain management: pain level controlled Vital Signs Assessment: post-procedure vital signs reviewed and stable Respiratory status: spontaneous breathing Cardiovascular status: blood pressure returned to baseline Anesthetic complications: no     Last Vitals:  Vitals:   12/17/18 1557 12/17/18 1558  BP: (!) 106/55   Pulse: 87   Resp: 19 (!) 21  Temp: (!) 36.3 C   SpO2: 96%     Last Pain:  Vitals:   12/17/18 1557  TempSrc:   PainSc: 0-No pain                 Ramie Palladino

## 2018-12-18 ENCOUNTER — Encounter: Payer: Self-pay | Admitting: Urology

## 2018-12-29 ENCOUNTER — Ambulatory Visit (INDEPENDENT_AMBULATORY_CARE_PROVIDER_SITE_OTHER): Payer: Medicare Other | Admitting: Urology

## 2018-12-29 ENCOUNTER — Encounter: Payer: Self-pay | Admitting: Urology

## 2018-12-29 ENCOUNTER — Other Ambulatory Visit: Payer: Self-pay

## 2018-12-29 VITALS — BP 117/68 | HR 132

## 2018-12-29 DIAGNOSIS — N200 Calculus of kidney: Secondary | ICD-10-CM

## 2018-12-29 DIAGNOSIS — N201 Calculus of ureter: Secondary | ICD-10-CM | POA: Diagnosis not present

## 2018-12-29 NOTE — Progress Notes (Signed)
Cystoscopy Procedure Note:  Indication: Stent removal s/p bilateral URS/LL/stents 12/17/2018  After informed consent and discussion of the procedure and its risks, Julie Acosta was positioned and prepped in the standard fashion. Cystoscopy was performed with a flexible cystoscope. Urine was bloody and was aspirated to improve vision. The stent was grasped with flexible graspers and removed in its entirety. The contralateral stent was also removed. The patient tolerated the procedure well.  Findings: Uncomplicated stent removal  Assessment and Plan: We discussed general stone prevention strategies including adequate hydration with goal of producing 2.5 L of urine daily, increasing citric acid intake, increasing calcium intake during high oxalate meals, minimizing animal protein, and decreasing salt intake. Information about dietary recommendations given today.   RTC 6 months KUB  Billey Co, MD 12/29/2018

## 2018-12-29 NOTE — Addendum Note (Signed)
Addended by: Kyra Manges on: 12/29/2018 04:37 PM   Modules accepted: Orders

## 2019-01-07 ENCOUNTER — Telehealth: Payer: Self-pay

## 2019-01-07 NOTE — Telephone Encounter (Signed)
Patient called, stated she received letter of Dr.byrnett no longer at this practice and she wishes to continue her care here. I let her know we would be contacting her in November 2020 to make appointment for  her mammogram and follow up breast exam. Message has been placed in recall box.

## 2019-01-07 NOTE — Progress Notes (Signed)
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  Oncotype DX score 1.  INTERVAL HISTORY: Patient returns to clinic today for routine evaluation and continuation of Prolia. She is tolerating letrozole well without significant side effects.  She currently feels well and is asymptomatic. She has no neurologic complaints.  She denies any recent fevers or illnesses.  She has a good appetite and denies weight loss.  She denies any chest pain, shortness of breath, cough, or hemoptysis.  She denies any nausea, vomiting, constipation, or diarrhea.  She has no urinary complaints.  Patient feels at her baseline offers no specific complaints today.  REVIEW OF SYSTEMS:   Review of Systems  Constitutional: Negative.  Negative for fever, malaise/fatigue and weight loss.  Respiratory: Negative.  Negative for cough and shortness of breath.   Cardiovascular: Negative.  Negative for chest pain and leg swelling.  Gastrointestinal: Negative.  Negative for abdominal pain.  Genitourinary: Negative.  Negative for dysuria.  Musculoskeletal: Negative.  Negative for joint pain.  Skin: Negative.  Negative for rash.  Neurological: Negative.  Negative for dizziness, sensory change, weakness and headaches.  Psychiatric/Behavioral: Negative.  The patient is not nervous/anxious.     As per HPI. Otherwise, a complete review of systems is negative.  PAST MEDICAL HISTORY:   PAST SURGICAL HISTORY: Past Surgical History:  Procedure Laterality Date  . APPENDECTOMY  1962  . BREAST BIOPSY Right 2006?   benign  . BREAST CYST EXCISION Left 07/27/2017   Procedure: SKIN CYST  EXCISED;  Surgeon: Robert Bellow, MD;  Location: [facility] ORS;  Service: General;  Laterality: Left;  . BREAST LUMPECTOMY Left 07/2017   invasive mammary, DCIS  mammosite  . BREAST SURGERY Right 2019  . CARPAL TUNNEL RELEASE Right   . CATARACT EXTRACTION W/PHACO Left 11/11/2016   Procedure: CATARACT EXTRACTION PHACO AND INTRAOCULAR LENS PLACEMENT (IOC);  Surgeon: Birder Robson, MD;  Location: [facility];  Service: Ophthalmology;  Laterality: Left;  [place] 01:07.9 AP% 19.2 CDE 13.02 Fluid pack lot # [id number]  . CATARACT EXTRACTION W/PHACO Right 12/09/2016   Procedure: CATARACT EXTRACTION PHACO AND INTRAOCULAR LENS PLACEMENT (IOC);  Surgeon: Birder Robson, MD;  Location: [facility];  Service: Ophthalmology;  Laterality: Right;  [place] 00:49 AP% 21.2 CDE 10.39 Fluid pack lot # [id number]  . CHOLECYSTECTOMY    . COLONOSCOPY WITH PROPOFOL N/A 11/22/2014   Procedure: COLONOSCOPY WITH PROPOFOL;  Surgeon: Manya Silvas, MD;  Location: [facility]COPY;  Service: Endoscopy;  Laterality: N/A;  . CYSTOSCOPY WITH STENT PLACEMENT Bilateral 11/27/2018   Procedure: CYSTOSCOPY WITH STENT PLACEMENT;  Surgeon: Festus Aloe, MD;  Location: [facility];  Service: Urology;  Laterality: Bilateral;  . CYSTOSCOPY/URETEROSCOPY/HOLMIUM LASER/STENT PLACEMENT Bilateral 12/17/2018   Procedure: CYSTOSCOPY/URETEROSCOPY/HOLMIUM LASER/STENT Exchange;  Surgeon: Billey Co, MD;  Location: [facility];  Service: Urology;  Laterality: Bilateral;  . ESOPHAGOGASTRODUODENOSCOPY  11/22/2014   Procedure: ESOPHAGOGASTRODUODENOSCOPY (EGD);  Surgeon: Manya Silvas, MD;  Location[facility] ENDOSCOPY;  Service: Endoscopy;;  . ESOPHAGOGASTRODUODENOSCOPY (EGD) WITH PROPOFOL N/A 03/05/2017   Procedure: ESOPHAGOGASTRODUODENOSCOPY (EGD) WITH PROPOFOL;  Surgeon: Lin Landsman, MD;  Location: [facility];  Service: Gastroenterology;  Laterality: N/A;  . EXTRACORPOREAL SHOCK WAVE LITHOTRIPSY Right 03/04/2018  Procedure: EXTRACORPOREAL SHOCK  WAVE LITHOTRIPSY (ESWL);  Surgeon: Billey Co, MD;  Location: [facility] ORS;  Service: Urology;  Laterality: Right;  . EYE SURGERY Bilateral 2012   cataract extraction with iol  . GALLBLADDER SURGERY  1968  . hemi pelvectomy     left side at age 27  . HEMIPELVIC Left 1962  . LEG SURGERY Left    AMPUTATION d/t cancer at 85 years old  . MASTECTOMY, PARTIAL Left 07/27/2017   Procedure: MASTECTOMY PARTIAL;  Surgeon: Robert Bellow, MD;  Location: [facility] OR[facility];  Service: General;  Laterality: Left;  . MOUTH SURGERY  2019   teeth pulled with a bridge insertion.   fell out 12/16/18  . OVARY SURGERY Right    cyst removed  . SAVORY DILATION  11/22/2014   Procedure: SAVORY DILATION;  Surgeon: Manya Silvas, MD;  Location: [facility] ENDOSCOPY;  Service: Endoscopy;;  . SENTINEL NODE BIOPSY Left 07/27/2017   Procedure: SENTINEL NODE BIOPSY;  Surgeon: Robert Bellow, MD;  Location: [facility];  Service: General;  Laterality: Left;  . SKIN CANCER EXCISION     nose and arm and face  . TONSILLECTOMY      FAMILY HISTORY: Family History  Problem Relation Age of Onset  . Breast cancer Paternal Aunt   . Ovarian cancer Sister   . Prostate cancer Father   . Stroke Mother   . Kidney cancer Neg Hx   . Bladder Cancer Neg Hx     ADVANCED DIRECTIVES (Y/N):  N  HEALTH MAINTENANCE: Social History   Tobacco Use  . Smoking status: Never Smoker  . Smokeless tobacco: Never Used  Substance Use Topics  . Alcohol use: No  . Drug use: No     Colonoscopy:  PAP:  Bone density:  Lipid panel:  Allergies  Allergen Reactions  . Ciprofloxacin Hives  . Codeine Other (See Comments)    HALLUCINATIONS  . Tape     Rash and skin irritation/ paper tape and tegaderm OK  . Ciprocinonide [Fluocinolone] Other (See Comments)    unknown  . Amoxicillin Other (See Comments)    Upset stomach Has patient had a PCN reaction causing immediate rash, facial/tongue/throat swelling, SOB or lightheadedness with hypotension:  No Has patient had a PCN reaction causing severe rash involving mucus membranes or skin necrosis: No Has patient had a PCN reaction that required hospitalization: No Has patient had a PCN reaction occurring within the last 10 years: Yes If all of the above answers are "NO", then may proceed with Cephalosporin use.;  . Cefuroxime Other (See Comments)    OK INTRACAMERALLY PER DR WLP, upset stomach  . Lipitor [Atorvastatin] Other (See Comments)    unknown    Current Outpatient Medications  Medication Sig Dispense Refill  . CLOBETASOL PROPIONATE EX Apply 1 Dose topically 2 (two) times daily. Lt ear    . fenofibrate 160 MG tablet Take 160 mg by mouth daily.     Marland Kitchen GLIPIZIDE XL 2.5 MG 24 hr tablet Take 2.5 mg by mouth daily.     Marland Kitchen letrozole (FEMARA) 2.5 MG tablet Take 1 tablet (2.5 mg total) by mouth daily. 30 tablet 3  . losartan (COZAAR) 50 MG tablet Take 50 mg by mouth daily.     . metFORMIN (GLUCOPHAGE) 500 MG tablet Take 1,000 mg by mouth 2 (two) times daily.     . metoprolol succinate (TOPROL-XL) 25 MG 24 hr tablet Take 12.5 mg by mouth at bedtime.     Marland Kitchen  omeprazole (PRILOSEC) 40 MG capsule Take 40 mg by mouth daily.     Marland Kitchen PARoxetine (PAXIL) 40 MG tablet Take 40 mg by mouth daily.     . raloxifene (EVISTA) 60 MG tablet Take 60 mg by mouth daily.     . rivaroxaban (XARELTO) 20 MG TABS tablet Take 20 mg by mouth daily with supper.    . sulfamethoxazole-trimethoprim (BACTRIM DS) 800-160 MG tablet Take 1 tablet by mouth daily. 14 tablet 0   No current facility-administered medications for this visit.     OBJECTIVE: Vitals:   01/17/19 1048  BP: 96/62  Pulse: 93  Temp: 99 F (37.2 C)     Body mass index is 31.82 kg/m.    ECOG FS:0 - Asymptomatic  General: Well-developed, well-nourished, no acute distress. Eyes: Pink conjunctiva, anicteric sclera. HEENT: Normocephalic, moist mucous membranes. Breast: Patient declined breast exam today. Lungs: Clear to auscultation bilaterally.  Heart: Regular rate and rhythm. No rubs, murmurs, or gallops. Abdomen: Soft, nontender, nondistended. No organomegaly noted, normoactive bowel sounds. Musculoskeletal: No edema, cyanosis, or clubbing. Neuro: Alert, answering all questions appropriately. Cranial nerves grossly intact. Skin: No rashes or petechiae noted. Psych: Normal affect.  LAB RESULTS:  Lab Results  Component Value Date   NA 139 01/17/2019   K 5.1 01/17/2019   CL 110 01/17/2019   CO2 21 (L) 01/17/2019   GLUCOSE 275 (H) 01/17/2019   BUN 22 01/17/2019   CREATININE 0.78 01/17/2019   CALCIUM 10.4 (H) 01/17/2019   PROT 7.0 10/02/2017   ALBUMIN 3.7 10/02/2017   AST 23 10/02/2017   ALT 12 (L) 10/02/2017   ALKPHOS 47 10/02/2017   BILITOT 0.8 10/02/2017   GFRNONAA >60 01/17/2019   GFRAA >60 01/17/2019    Lab Results  Component Value Date   WBC 6.7 01/17/2019   NEUTROABS 4.5 01/17/2019   HGB 9.2 (L) 01/17/2019   HCT 31.3 (L) 01/17/2019   MCV 85.1 01/17/2019   PLT 297 01/17/2019     STUDIES: No results found.  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  Oncotype DX score 1  PLAN:    1.  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  Patient had a lumpectomy on July 27, 2017.  She completed radiation with MammoSite treatment.  Continue letrozole for total 5 years completing in May 2024. Her most recent mammogram on May 18, 2018 was reported as BI-RADS 2.  Repeat in January 2021.  Return to clinic in 6 months for routine evaluation. 2.  Osteopenia: Patient's most recent bone mineral density on October 13, 2018 reported T score of -2.1 which is improved from 1 year prior when the T score was reported -2.4. Patient refused Fosamax, but has agreed to Prolia every 6  months.  Proceed with Prolia today.  Continue calcium and vitamin D supplementations.  Repeat bone mineral density in June 2021. 3.  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  I spent a total of 30 minutes face-to-face with the patient of which greater than 50% of the visit was spent in counseling and coordination of care as detailed above.   Patient expressed understanding and was in agreement with this plan. She also understands  that She can call clinic at any time with any questions, concerns, or complaints.   Cancer Staging Primary cancer of upper outer quadrant of left female breast [facility]) Staging form: Breast, AJCC 8th Edition - Clinical stage from 06/12/2017: Stage IA (cT1b, cN0, cM0, G2, ER: Positive, PR: Positive, HER2: Negative) - Signed by Lloyd Huger, MD on 06/12/2017   Lloyd Huger, MD   01/18/2019 6:29 AM

## 2019-01-13 ENCOUNTER — Other Ambulatory Visit: Payer: Self-pay

## 2019-01-13 DIAGNOSIS — C50412 Malignant neoplasm of upper-outer quadrant of left female breast: Secondary | ICD-10-CM

## 2019-01-17 ENCOUNTER — Inpatient Hospital Stay: Payer: Medicare Other | Attending: Oncology

## 2019-01-17 ENCOUNTER — Other Ambulatory Visit: Payer: Self-pay

## 2019-01-17 ENCOUNTER — Ambulatory Visit: Payer: Medicare Other | Admitting: Podiatry

## 2019-01-17 ENCOUNTER — Encounter: Payer: Self-pay | Admitting: Oncology

## 2019-01-17 ENCOUNTER — Inpatient Hospital Stay (HOSPITAL_BASED_OUTPATIENT_CLINIC_OR_DEPARTMENT_OTHER): Payer: Medicare Other | Admitting: Oncology

## 2019-01-17 ENCOUNTER — Inpatient Hospital Stay: Payer: Medicare Other

## 2019-01-17 VITALS — BP 96/62 | HR 93 | Temp 99.0°F | Ht 60.0 in

## 2019-01-17 DIAGNOSIS — Z85828 Personal history of other malignant neoplasm of skin: Secondary | ICD-10-CM | POA: Diagnosis not present

## 2019-01-17 DIAGNOSIS — Z7984 Long term (current) use of oral hypoglycemic drugs: Secondary | ICD-10-CM | POA: Insufficient documentation

## 2019-01-17 DIAGNOSIS — Z79899 Other long term (current) drug therapy: Secondary | ICD-10-CM | POA: Insufficient documentation

## 2019-01-17 DIAGNOSIS — M810 Age-related osteoporosis without current pathological fracture: Secondary | ICD-10-CM | POA: Diagnosis not present

## 2019-01-17 DIAGNOSIS — Z170 Estrogen receptor positive status [ER+]: Secondary | ICD-10-CM | POA: Insufficient documentation

## 2019-01-17 DIAGNOSIS — Z803 Family history of malignant neoplasm of breast: Secondary | ICD-10-CM | POA: Insufficient documentation

## 2019-01-17 DIAGNOSIS — Z8041 Family history of malignant neoplasm of ovary: Secondary | ICD-10-CM | POA: Insufficient documentation

## 2019-01-17 DIAGNOSIS — C50412 Malignant neoplasm of upper-outer quadrant of left female breast: Secondary | ICD-10-CM | POA: Insufficient documentation

## 2019-01-17 DIAGNOSIS — M8580 Other specified disorders of bone density and structure, unspecified site: Secondary | ICD-10-CM | POA: Diagnosis not present

## 2019-01-17 DIAGNOSIS — Z79811 Long term (current) use of aromatase inhibitors: Secondary | ICD-10-CM | POA: Diagnosis not present

## 2019-01-17 LAB — CBC WITH DIFFERENTIAL/PLATELET
Abs Immature Granulocytes: 0.04 10*3/uL (ref 0.00–0.07)
Basophils Absolute: 0.1 10*3/uL (ref 0.0–0.1)
Basophils Relative: 1 %
Eosinophils Absolute: 0.1 10*3/uL (ref 0.0–0.5)
Eosinophils Relative: 2 %
HCT: 31.3 % — ABNORMAL LOW (ref 36.0–46.0)
Hemoglobin: 9.2 g/dL — ABNORMAL LOW (ref 12.0–15.0)
Immature Granulocytes: 1 %
Lymphocytes Relative: 23 %
Lymphs Abs: 1.5 10*3/uL (ref 0.7–4.0)
MCH: 25 pg — ABNORMAL LOW (ref 26.0–34.0)
MCHC: 29.4 g/dL — ABNORMAL LOW (ref 30.0–36.0)
MCV: 85.1 fL (ref 80.0–100.0)
Monocytes Absolute: 0.5 10*3/uL (ref 0.1–1.0)
Monocytes Relative: 7 %
Neutro Abs: 4.5 10*3/uL (ref 1.7–7.7)
Neutrophils Relative %: 66 %
Platelets: 297 10*3/uL (ref 150–400)
RBC: 3.68 MIL/uL — ABNORMAL LOW (ref 3.87–5.11)
RDW: 16.1 % — ABNORMAL HIGH (ref 11.5–15.5)
WBC: 6.7 10*3/uL (ref 4.0–10.5)
nRBC: 0 % (ref 0.0–0.2)

## 2019-01-17 LAB — BASIC METABOLIC PANEL
Anion gap: 8 (ref 5–15)
BUN: 22 mg/dL (ref 8–23)
CO2: 21 mmol/L — ABNORMAL LOW (ref 22–32)
Calcium: 10.4 mg/dL — ABNORMAL HIGH (ref 8.9–10.3)
Chloride: 110 mmol/L (ref 98–111)
Creatinine, Ser: 0.78 mg/dL (ref 0.44–1.00)
GFR calc Af Amer: >60 mL/min (ref >60)
GFR calc non Af Amer: >60 mL/min (ref >60)
Glucose, Bld: 275 mg/dL — ABNORMAL HIGH (ref 70–99)
Potassium: 5.1 mmol/L (ref 3.5–5.1)
Sodium: 139 mmol/L (ref 135–145)

## 2019-01-17 MED ORDER — DENOSUMAB 60 MG/ML ~~LOC~~ SOSY
60.0000 mg | PREFILLED_SYRINGE | Freq: Once | SUBCUTANEOUS | Status: AC
  Administered 2019-01-17: 60 mg via SUBCUTANEOUS
  Filled 2019-01-17: qty 1

## 2019-01-17 NOTE — Progress Notes (Signed)
Patient stated that she had been doing well. 

## 2019-01-20 ENCOUNTER — Encounter: Payer: Self-pay | Admitting: Podiatry

## 2019-01-20 ENCOUNTER — Ambulatory Visit (INDEPENDENT_AMBULATORY_CARE_PROVIDER_SITE_OTHER): Payer: Medicare Other | Admitting: Podiatry

## 2019-01-20 ENCOUNTER — Other Ambulatory Visit: Payer: Self-pay

## 2019-01-20 DIAGNOSIS — B351 Tinea unguium: Secondary | ICD-10-CM | POA: Diagnosis not present

## 2019-01-20 DIAGNOSIS — Z89512 Acquired absence of left leg below knee: Secondary | ICD-10-CM

## 2019-01-20 DIAGNOSIS — E119 Type 2 diabetes mellitus without complications: Secondary | ICD-10-CM | POA: Diagnosis not present

## 2019-01-20 DIAGNOSIS — M79674 Pain in right toe(s): Secondary | ICD-10-CM | POA: Diagnosis not present

## 2019-01-20 DIAGNOSIS — Z89511 Acquired absence of right leg below knee: Secondary | ICD-10-CM

## 2019-01-20 NOTE — Progress Notes (Signed)
This patient presents the office for continued preventative foot care services on her right foot. She hasn't history of an amputation of the left leg due to bone cancer at 26.  Patient is a type II diabetic.  Patient is presently taking Xarelto  and Paxil.  Patient states that the nails have grown thick and long and are painful walking and wearing her shoes.  She presents the office today for preventative foot care services.     General Appearance  Alert, conversant and in no acute stress.  Vascular  Dorsalis pedis and posterior tibial  pulses are palpable  right..  Capillary return is within normal limits right foot.. Temperature is within normal limits  Right foot.  Neurologic  Senn-Weinstein monofilament wire test within normal limits  . Muscle power within normal limits .  Nails Thick disfigured discolored nails with subungual debris  from hallux to fifth toes right. No evidence of bacterial infection or drainage   Orthopedic  No limitations of motion of motion feet .  No crepitus or effusions noted.  No bony pathology or digital deformities noted. Amputation left leg.  Skin  normotropic skin with no porokeratosis noted right.  No signs of infections or ulcers noted.    Onychomycosis  Amputation left leg.  Diabetes.     Debridement and grinding of nails right foot.    RTC 3 months   Julie Acosta DPM 

## 2019-02-06 IMAGING — CR DG ABDOMEN 1V
2 series · 2 of 2 positions shown · non-contrast
Comparison: Abdominal x-ray dated March 04, 2018.

CLINICAL DATA: Right ureteral stone. Lithotripsy 2 weeks ago.
Currently asymptomatic.

EXAM:
ABDOMEN - 1 VIEW

[abdomen kub (1 of 2)]
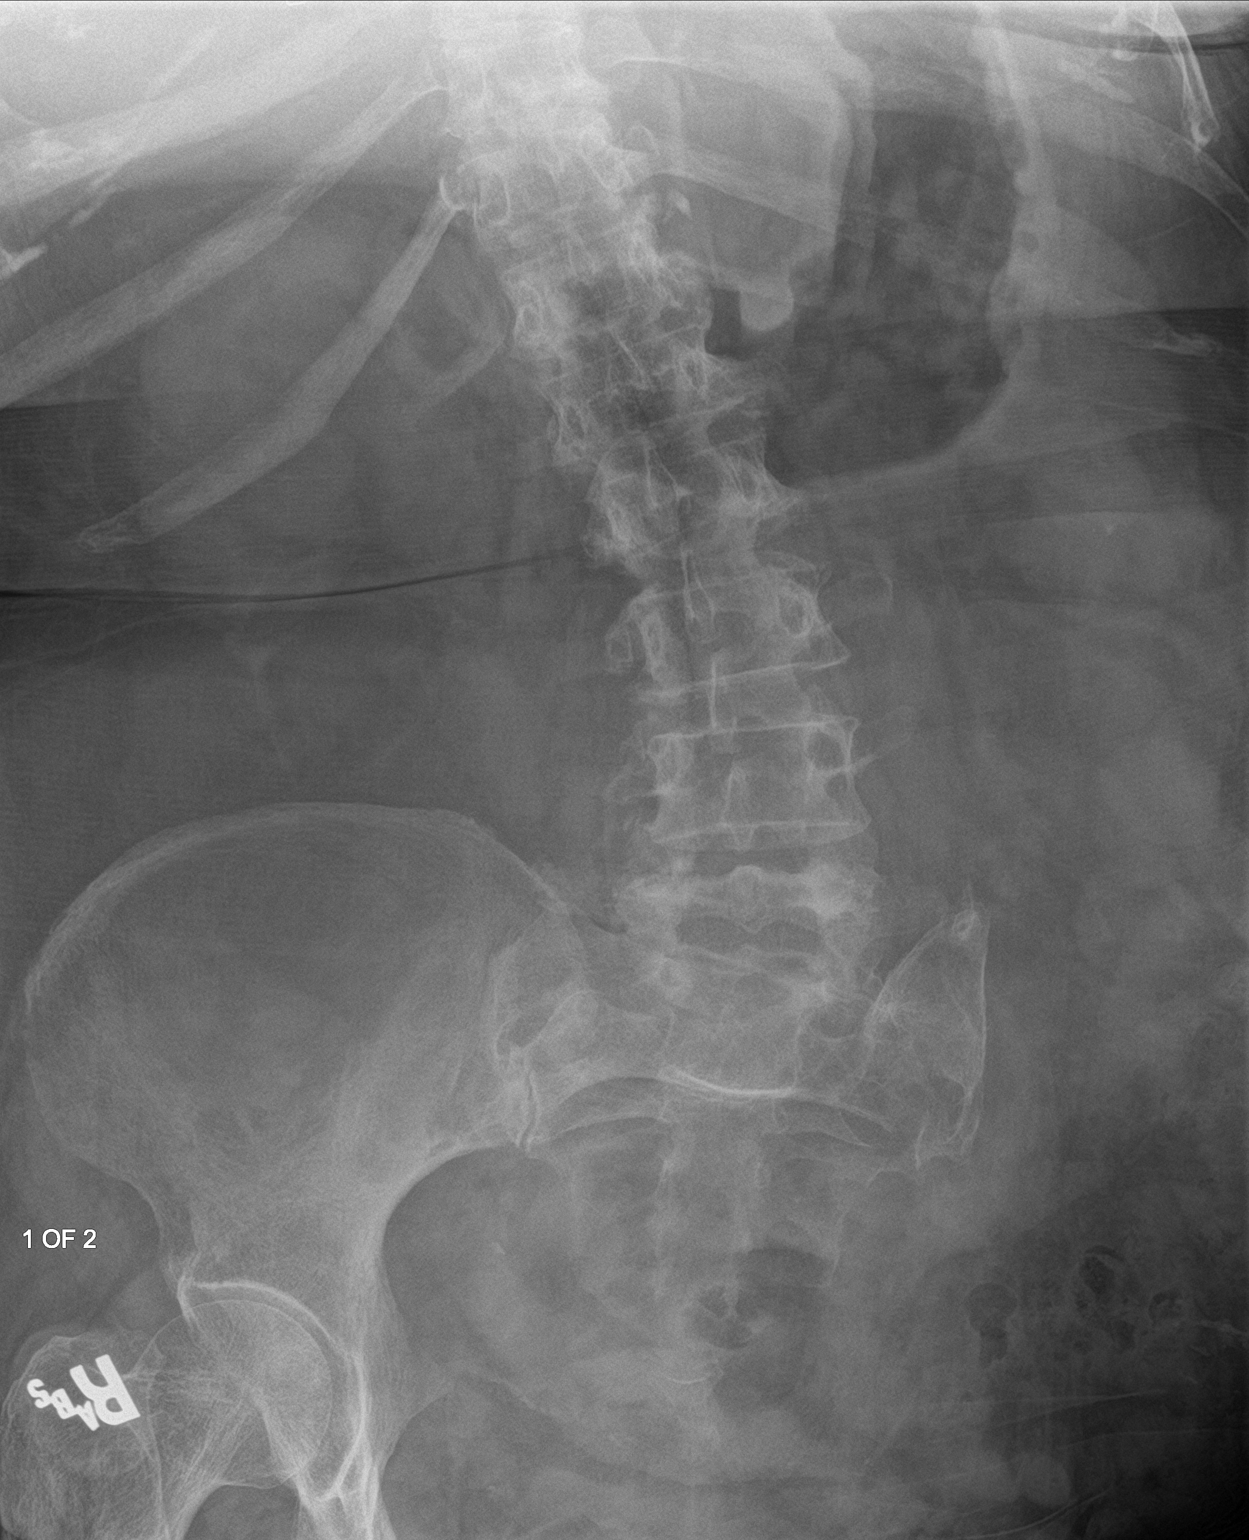

[abdomen kub (2 of 2)]
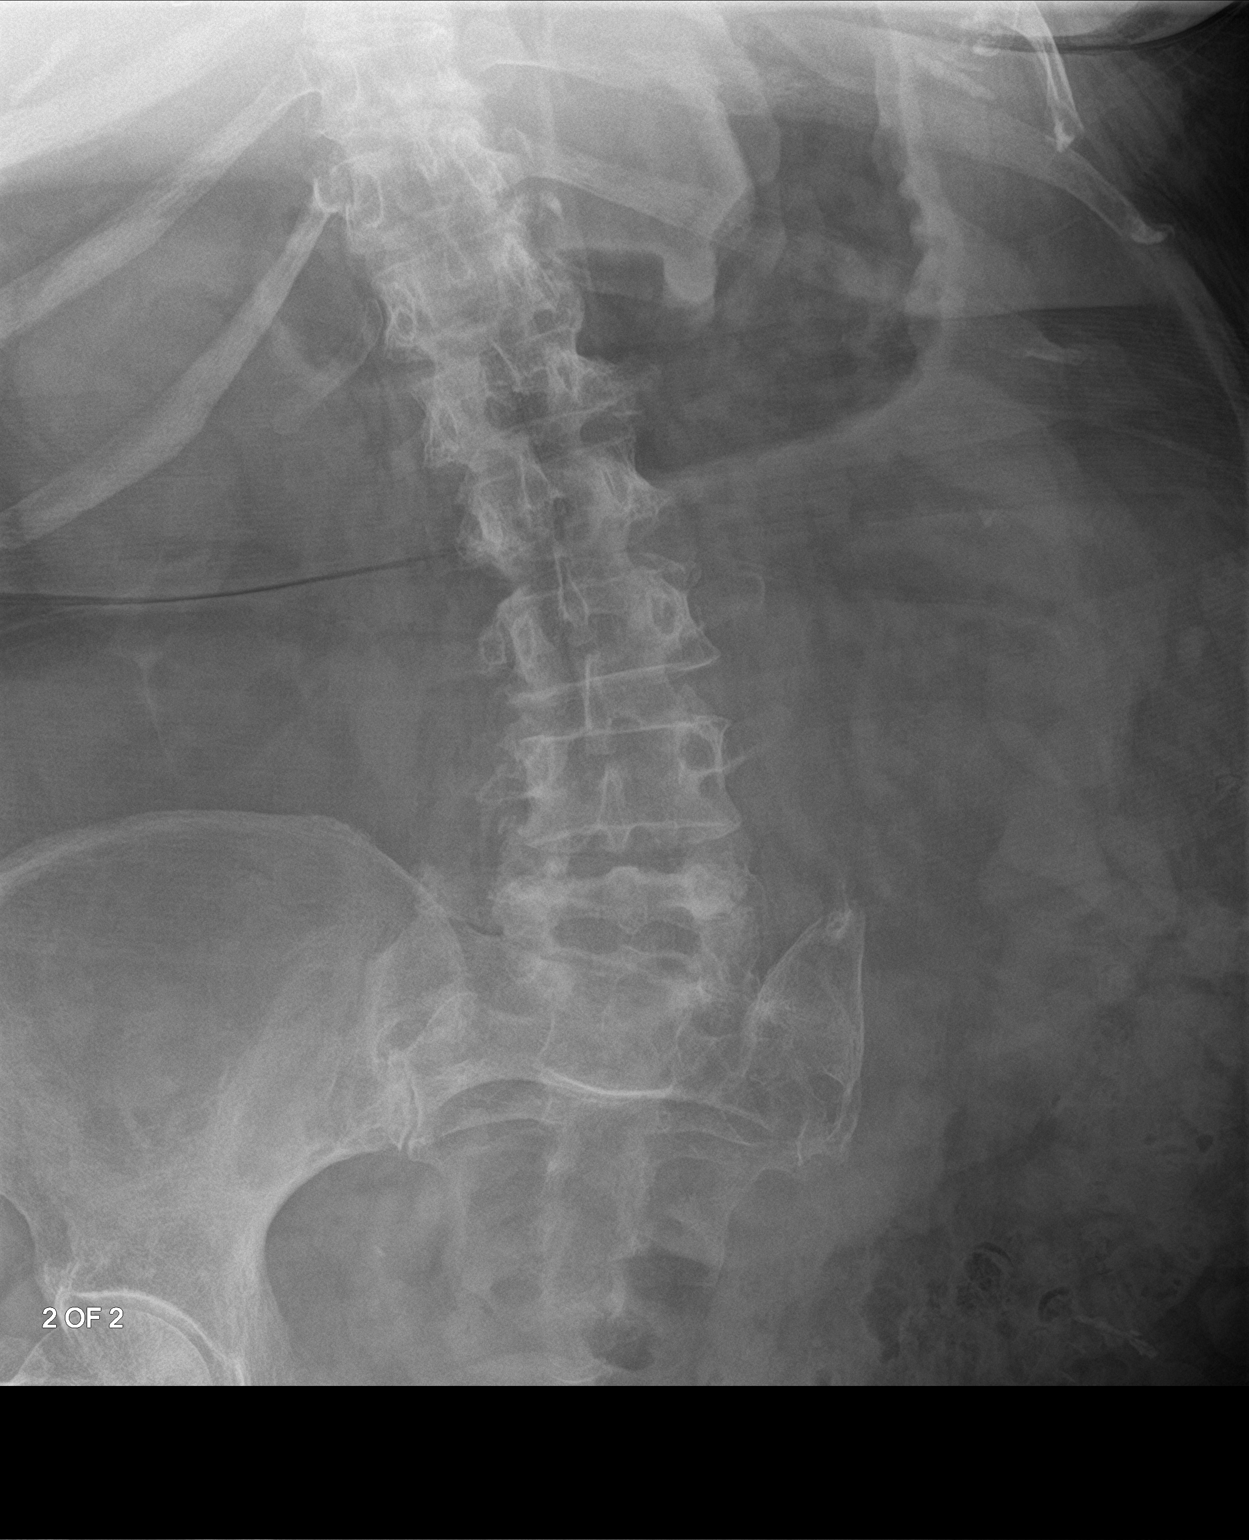

[2 of 2 positions shown; findings below may reference images not displayed]

FINDINGS: The bowel gas pattern is normal. The previously seen distal right
ureteral calculus is no longer identified. Unchanged 3 mm
nonobstructive left renal calculus. Prior left hemipelvectomy.
IMPRESSION: 1. Distal right ureteral calculus no longer identified.
2. Unchanged nonobstructive left nephrolithiasis.

## 2019-03-24 ENCOUNTER — Encounter: Payer: Self-pay | Admitting: Family Medicine

## 2019-04-08 ENCOUNTER — Other Ambulatory Visit: Payer: Self-pay

## 2019-04-11 ENCOUNTER — Ambulatory Visit
Admission: RE | Admit: 2019-04-11 | Discharge: 2019-04-11 | Disposition: A | Discharge status: Home / Self Care | Payer: Medicare Other | Source: Ambulatory Visit | Attending: Radiation Oncology | Admitting: Radiation Oncology

## 2019-04-11 ENCOUNTER — Other Ambulatory Visit: Payer: Self-pay

## 2019-04-11 DIAGNOSIS — Z79811 Long term (current) use of aromatase inhibitors: Secondary | ICD-10-CM | POA: Diagnosis not present

## 2019-04-11 DIAGNOSIS — C50412 Malignant neoplasm of upper-outer quadrant of left female breast: Secondary | ICD-10-CM | POA: Diagnosis present

## 2019-04-11 DIAGNOSIS — Z923 Personal history of irradiation: Secondary | ICD-10-CM | POA: Diagnosis not present

## 2019-04-11 DIAGNOSIS — Z170 Estrogen receptor positive status [ER+]: Secondary | ICD-10-CM | POA: Diagnosis not present

## 2019-04-11 NOTE — Progress Notes (Signed)
Radiation Oncology Follow up Note  Name: Julie Acosta   Date:   04/11/2019 MRN:  [id number] DOB: Jan 29, 1934    This 85 y.o. female presents to the clinic today for 2-month follow-up status post accelerated partial breast radiation to left breast for ER/PR positive invasive mammary carcinoma.  REFERRING PROVIDER: Maryland Pink, MD  HPI: Patient is an 85 year old female now seen at 18 months having completed accelerated partial breast radiation to her left breast for ER/PR positive invasive mammary carcinoma.  Seen today in routine follow-up she is doing well.  She specifically denies breast tenderness cough or bone pain..  She is currently on Femara tolerating that well without side effect.  She had mammograms back in January which I have reviewed were BI-RADS 2 benign.  Patient is status post a hemipelvectomy making transportation and ambulation extremely difficult  COMPLICATIONS OF TREATMENT: none  FOLLOW UP COMPLIANCE: keeps appointments   PHYSICAL EXAM:  BP (!) (P) 129/47   Pulse (P) 97   Temp (P) 98.9 F (37.2 C) (Tympanic)   Resp (P) 16  Lungs are clear to A&P cardiac examination essentially unremarkable with regular rate and rhythm. No dominant mass or nodularity is noted in either breast in 2 positions examined. Incision is well-healed. No axillary or supraclavicular adenopathy is appreciated. Cosmetic result is excellent.  Well-developed well-nourished patient in NAD. HEENT reveals PERLA, EOMI, discs not visualized.  Oral cavity is clear. No oral mucosal lesions are identified. Neck is clear without evidence of cervical or supraclavicular adenopathy. Lungs are clear to A&P. Cardiac examination is essentially unremarkable with regular rate and rhythm without murmur rub or thrill. Abdomen is benign with no organomegaly or masses noted. Motor sensory and DTR levels are equal and symmetric in the upper and lower extremities. Cranial nerves II through XII are grossly intact.  Proprioception is intact. No peripheral adenopathy or edema is identified. No motor or sensory levels are noted. Crude visual fields are within normal range.  Patient is status post left leg amputation as well as hemipelvectomy  RADIOLOGY RESULTS: Mammograms reviewed compatible with above-stated findings  PLAN: Present time patient is 18 months out from accelerated partial breast radiation with no evidence of disease.  She is just scheduled a follow-up appointment in reconnecting with Dr. Tollie Pizza.  Based on her problems with transportation and ambulation I am going to turn follow-up care over to Dr. Tollie Pizza.  I be happy to reevaluate the patient anytime the future should that be indicated.  I would like to take this opportunity to thank you for allowing me to participate in the care of your patient.Noreene Filbert, MD

## 2019-04-21 ENCOUNTER — Ambulatory Visit: Payer: Medicare Other | Admitting: Podiatry

## 2019-05-20 ENCOUNTER — Other Ambulatory Visit: Payer: Medicare Other

## 2019-05-26 ENCOUNTER — Ambulatory Visit: Payer: Medicare Other | Admitting: Surgery

## 2019-05-30 ENCOUNTER — Other Ambulatory Visit: Payer: Self-pay

## 2019-05-30 ENCOUNTER — Encounter: Payer: Self-pay | Admitting: Podiatry

## 2019-05-30 ENCOUNTER — Ambulatory Visit (INDEPENDENT_AMBULATORY_CARE_PROVIDER_SITE_OTHER): Payer: Medicare Other | Admitting: Podiatry

## 2019-05-30 DIAGNOSIS — S88912D Complete traumatic amputation of left lower leg, level unspecified, subsequent encounter: Secondary | ICD-10-CM

## 2019-05-30 DIAGNOSIS — M79674 Pain in right toe(s): Secondary | ICD-10-CM | POA: Diagnosis not present

## 2019-05-30 DIAGNOSIS — D6851 Activated protein C resistance: Secondary | ICD-10-CM | POA: Insufficient documentation

## 2019-05-30 DIAGNOSIS — E119 Type 2 diabetes mellitus without complications: Secondary | ICD-10-CM

## 2019-05-30 DIAGNOSIS — B351 Tinea unguium: Secondary | ICD-10-CM

## 2019-05-30 DIAGNOSIS — D689 Coagulation defect, unspecified: Secondary | ICD-10-CM | POA: Insufficient documentation

## 2019-05-30 NOTE — Progress Notes (Signed)
This patient presents the office for continued preventative foot care services on her right foot. She hasn't history of an amputation of the left leg due to bone cancer at 26.  Patient is a type II diabetic.  Patient is presently taking Xarelto  and Paxil.  Patient states that the nails have grown thick and long and are painful walking and wearing her shoes.  She presents the office today for preventative foot care services.     General Appearance  Alert, conversant and in no acute stress.  Vascular  Dorsalis pedis and posterior tibial  pulses are palpable  right..  Capillary return is within normal limits right foot.. Temperature is within normal limits  Right foot.  Neurologic  Senn-Weinstein monofilament wire test within normal limits  . Muscle power within normal limits .  Nails Thick disfigured discolored nails with subungual debris  from hallux to fifth toes right. No evidence of bacterial infection or drainage   Orthopedic  No limitations of motion of motion feet .  No crepitus or effusions noted.  No bony pathology or digital deformities noted. Amputation left leg.  Skin  normotropic skin with no porokeratosis noted right.  No signs of infections or ulcers noted.    Onychomycosis  Amputation left leg.  Diabetes.     Debridement and grinding of nails right foot.    RTC 3 months   Brodi Nery DPM 

## 2019-06-21 ENCOUNTER — Telehealth: Payer: Self-pay | Admitting: Urology

## 2019-06-21 ENCOUNTER — Ambulatory Visit
Admission: RE | Admit: 2019-06-21 | Discharge: 2019-06-21 | Disposition: A | Discharge status: Home / Self Care | Payer: Medicare Other | Source: Ambulatory Visit | Attending: Oncology | Admitting: Oncology

## 2019-06-21 DIAGNOSIS — Z79811 Long term (current) use of aromatase inhibitors: Secondary | ICD-10-CM | POA: Insufficient documentation

## 2019-06-21 DIAGNOSIS — N200 Calculus of kidney: Secondary | ICD-10-CM

## 2019-06-21 DIAGNOSIS — C50412 Malignant neoplasm of upper-outer quadrant of left female breast: Secondary | ICD-10-CM

## 2019-06-21 HISTORY — DX: Personal history of irradiation: Z92.3

## 2019-06-21 NOTE — Telephone Encounter (Signed)
KUB ordered.

## 2019-06-21 NOTE — Telephone Encounter (Signed)
Patient is due for a 6 month follow up with KUB prior.  Her appointment is scheduled for 07/13/19.  She is planning to come to [facility] the day of her appointment to get her KUB.  However, I do not see a future order for a KUB.    Will you check behind me?  If there is not one, will you please place the order?  Thank you!

## 2019-06-30 ENCOUNTER — Ambulatory Visit (INDEPENDENT_AMBULATORY_CARE_PROVIDER_SITE_OTHER): Payer: Medicare Other | Admitting: Surgery

## 2019-06-30 ENCOUNTER — Other Ambulatory Visit: Payer: Self-pay

## 2019-06-30 ENCOUNTER — Encounter: Payer: Self-pay | Admitting: Surgery

## 2019-06-30 VITALS — BP 117/70 | HR 89 | Temp 97.5°F

## 2019-06-30 DIAGNOSIS — C50412 Malignant neoplasm of upper-outer quadrant of left female breast: Secondary | ICD-10-CM | POA: Diagnosis not present

## 2019-06-30 DIAGNOSIS — Z170 Estrogen receptor positive status [ER+]: Secondary | ICD-10-CM

## 2019-06-30 NOTE — Progress Notes (Signed)
Patient ID: Julie Acosta, female   DOB: [birth year], 86 y.o.   MRN: [id number]  Chief Complaint: Follow-up exam for left breast cancer.  History of Present Illness Julie Acosta is a 86 y.o. female with history of stage Ia ER/PR positive HER-2 negative invasive breast cancer of the upper outer quadrant on the left.  She underwent MammoSite brachytherapy.  And is currently taking her aromatase inhibitor.  She obtained her mammography recently which is finding no suspicious changes. She is tolerating letrozole well without significant side effects.  She currently feels well and is asymptomatic. She has no neurologic complaints.  She denies any recent fevers or illnesses.  She has a good appetite and denies weight loss.  She denies any chest pain, shortness of breath, cough, or hemoptysis.     Past Medical History Past Medical History:  Diagnosis Date  . Anxiety   . Benign breast lumps   . Blood clotting disorder ([place])   . Bone cancer (HCC)    head of femur, left leg ... amputation at age 18  . Breast cancer of upper-outer quadrant of left female breast ([place]) 07/27/2017   11 mm invasive mammary carcinoma, ER 90%, PR 51-90%, negative margins.  Oncotype recurrence score: 1.  DCIS, margin less than 0.5 mm.  Negative sentinel node.  Accelerated partial breast radiation.  . Cervicalgia   . Chronic kidney disease    kidney stones  . Colon cancer [facility])    patient unaware of this  . Complication of anesthesia    mood alteration / not sure if d/t pain medicine or anesthesia as she passed out   . Cough    NASAL DRIP / SNEEZING / SORE THROAT MOSTLY CONSTANT  . Depression   . Diabetes ([place])   . Diverticulitis   . Dizzy   . GERD (gastroesophageal reflux disease)   . H/O blood clots    arm and leg  . HBP (high blood pressure)   . Hematuria    gross  . Hemorrhoids   . HLD (hyperlipidemia)   . Muscle pain   . Osteoarthritis   . Osteoporosis   . Panic disorder   . Personal history of  radiation therapy   . PND (post-nasal drip)    CHRONIC WITH SORE THROAT AND SNEEZING  . Reflux   . Skin cancer    nose  . Swelling   . Tremor   . Tremors of nervous system       Past Surgical History:  Procedure Laterality Date  . APPENDECTOMY  1962  . BREAST BIOPSY Right 2006?   benign  . BREAST BIOPSY Left 2019   invasive mammary carcinoma  . BREAST CYST EXCISION Left 07/27/2017   Procedure: SKIN CYST EXCISED;  Surgeon: Robert Bellow, MD;  Location: [facility] ORS;  Service: General;  Laterality: Left;  . BREAST LUMPECTOMY Left 07/2017   invasive mammary, DCIS  mammosite  . BREAST SURGERY Right 2019  . CARPAL TUNNEL RELEASE Right   . CATARACT EXTRACTION W/PHACO Left 11/11/2016   Procedure: CATARACT EXTRACTION PHACO AND INTRAOCULAR LENS PLACEMENT (IOC);  Surgeon: Birder Robson, MD;  Location: [facility] ORS;  Service: Ophthalmology;  Laterality: Left;  [place] 01:07.9 AP% 19.2 CDE 13.02 Fluid pack lot # [id number]  . CATARACT EXTRACTION W/PHACO Right 12/09/2016   Procedure: CATARACT EXTRACTION PHACO AND INTRAOCULAR LENS PLACEMENT (IOC);  Surgeon: Birder Robson, MD;  Location: [facility] ORS;  Service: Ophthalmology;  Laterality: Right;  [place] 00:49 AP% 21.2 CDE 10.39 Fluid pack  lot # [id number]  . CHOLECYSTECTOMY    . COLONOSCOPY WITH PROPOFOL N/A 11/22/2014   Procedure: COLONOSCOPY WITH PROPOFOL;  Surgeon: Manya Silvas, MD;  [facility] ENDOSCOPY;  Service: Endoscopy;  Laterality: N/A;  . CYSTOSCOPY WITH STENT PLACEMENT Bilateral 11/27/2018   Procedure: CYSTOSCOPY WITH STENT PLACEMENT;  Surgeon: Festus Aloe, MD;  Location: [facility] ORS;  Service: Urology;  Laterality: Bilateral;  . CYSTOSCOPY/URETEROSCOPY/HOLMIUM LASER/STENT PLACEMENT Bilateral 12/17/2018   Procedure: CYSTOSCOPY/URETEROSCOPY/HOLMIUM LASER/STENT Exchange;  Surgeon: Billey Co, MD;  Location: [facility] ORS;  Service: Urology;  Laterality: Bilateral;  . ESOPHAGOGASTRODUODENOSCOPY  11/22/2014   Procedure:  ESOPHAGOGASTRODUODENOSCOPY (EGD);  Surgeon: Manya Silvas, MD;  Location[facility] ENDOSCOPY;  Service: Endoscopy;;  . ESOPHAGOGASTRODUODENOSCOPY (EGD) WITH PROPOFOL N/A 03/05/2017   Procedure: ESOPHAGOGASTRODUODENOSCOPY (EGD) WITH PROPOFOL;  Surgeon: Lin Landsman, MD;  Location: [facility] ENDOSCOPY;  Service: Gastroenterology;  Laterality: N/A;  . EXTRACORPOREAL SHOCK WAVE LITHOTRIPSY Right 03/04/2018   Procedure: EXTRACORPOREAL SHOCK WAVE LITHOTRIPSY (ESWL);  Surgeon: Billey Co, MD;  Location: [facility] ORS;  Service: Urology;  Laterality: Right;  . EYE SURGERY Bilateral 2012   cataract extraction with iol  . GALLBLADDER SURGERY  1968  . hemi pelvectomy     left side at age 70  . HEMIPELVIC Left 1962  . LEG SURGERY Left    AMPUTATION d/t cancer at 86 years old  . MASTECTOMY, PARTIAL Left 07/27/2017   Procedure: MASTECTOMY PARTIAL;  Surgeon: Robert Bellow, MD;  Location: [facility] ORS;  Service: General;  Laterality: Left;  . MOUTH SURGERY  2019   teeth pulled with a bridge insertion.   fell out 12/16/18  . OVARY SURGERY Right    cyst removed  . SAVORY DILATION  11/22/2014   Procedure: SAVORY DILATION;  Surgeon: Manya Silvas, MD;  Location: [facility] ENDOSCOPY;  Service: Endoscopy;;  . SENTINEL NODE BIOPSY Left 07/27/2017   Procedure: SENTINEL NODE BIOPSY;  Surgeon: Robert Bellow, MD;  Location: [facility] ORS;  Service: General;  Laterality: Left;  . SKIN CANCER EXCISION     nose and arm and face  . TONSILLECTOMY      Allergies  Allergen Reactions  . Ciprofloxacin Hives  . Codeine Other (See Comments)    HALLUCINATIONS  . Tape     Rash and skin irritation/ paper tape and tegaderm OK  . Ciprocinonide [Fluocinolone] Other (See Comments)    unknown  . Amoxicillin Other (See Comments)    Upset stomach Has patient had a PCN reaction causing immediate rash, facial/tongue/throat swelling, SOB or lightheadedness with hypotension: No Has patient had a PCN reaction causing severe rash  involving mucus membranes or skin necrosis: No Has patient had a PCN reaction that required hospitalization: No Has patient had a PCN reaction occurring within the last 10 years: Yes If all of the above answers are "NO", then may proceed with Cephalosporin use.;  . Cefuroxime Other (See Comments)    OK INTRACAMERALLY PER DR WLP, upset stomach  . Lipitor [Atorvastatin] Other (See Comments)    unknown    Current Outpatient Medications  Medication Sig Dispense Refill  . CLOBETASOL PROPIONATE EX Apply 1 Dose topically 2 (two) times daily. Lt ear    . fenofibrate 160 MG tablet TAKE ONE TABLET BY MOUTH EVERY DAY    . GLIPIZIDE XL 2.5 MG 24 hr tablet Take 2.5 mg by mouth daily.     Marland Kitchen letrozole (FEMARA) 2.5 MG tablet Take 1 tablet (2.5 mg total) by mouth daily. 30 tablet 3  .  losartan (COZAAR) 50 MG tablet TAKE 1 TABLET BY MOUTH DAILY    . metFORMIN (GLUCOPHAGE) 500 MG tablet Take 1,000 mg by mouth 2 (two) times daily.     . metoprolol succinate (TOPROL-XL) 25 MG 24 hr tablet Take 12.5 mg by mouth at bedtime.     Marland Kitchen omeprazole (PRILOSEC) 40 MG capsule Take 40 mg by mouth daily.     Marland Kitchen PARoxetine (PAXIL) 40 MG tablet Take 40 mg by mouth daily.     Vladimir Faster Glycol-Propyl Glycol 0.4-0.3 % SOLN Apply to eye 2 (two) times daily as needed.    . raloxifene (EVISTA) 60 MG tablet Take 60 mg by mouth daily.     . rivaroxaban (XARELTO) 20 MG TABS tablet Take 20 mg by mouth daily with supper.    . metFORMIN (GLUCOPHAGE) 1000 MG tablet      No current facility-administered medications for this visit.    Family History Family History  Problem Relation Age of Onset  . Breast cancer Paternal Aunt   . Ovarian cancer Sister   . Prostate cancer Father   . Stroke Mother   . Kidney cancer Neg Hx   . Bladder Cancer Neg Hx       Social History Social History   Tobacco Use  . Smoking status: Never Smoker  . Smokeless tobacco: Never Used  Substance Use Topics  . Alcohol use: No  . Drug use: No         Review of Systems  Constitutional: Negative.   HENT: Negative.   Eyes: Negative.   Respiratory: Negative.   Cardiovascular: Negative.   Gastrointestinal: Negative.   Genitourinary: Negative.   Musculoskeletal: Negative.   Skin: Negative.   Neurological: Negative.   Endo/Heme/Allergies: Negative.       Physical Exam Blood pressure 117/70, pulse 89, temperature (!) 97.5 F (36.4 C), temperature source Temporal, SpO2 93 %.   CONSTITUTIONAL: Well developed, and nourished, appropriately responsive and aware without distress.   EYES: Sclera non-icteric.   EARS, NOSE, MOUTH AND THROAT: Mask worn.   Hearing is intact to voice.  NECK: Trachea is midline, and there is no jugular venous distension.  LYMPH NODES:  Lymph nodes in the neck are not enlarged. RESPIRATORY:  Lungs are clear, and breath sounds are equal bilaterally. Normal respiratory effort without pathologic use of accessory muscles. CARDIOVASCULAR: Heart is regular in rate and rhythm. GI: The abdomen is  soft, nontender, and nondistended.  GU: Breast exam, shows well-healed scars on bilateral breasts.  There is no scar related densities, nodularity or suspicious masses.  There are no other skin changes, suspicious nodularity or densities on either breast in exam. MUSCULOSKELETAL:  Symmetrical muscle tone appreciated in all four extremities.    SKIN: Skin turgor is normal. No pathologic skin lesions appreciated.  NEUROLOGIC:  Motor and sensation appear grossly normal.  Cranial nerves are grossly without defect. PSYCH:  Alert and oriented to person, place and time. Affect is appropriate for situation.  Data Reviewed I have personally reviewed what is currently available of the patient's imaging, recent labs and medical records.   Labs:  CBC Latest Ref Rng & Units 01/17/2019 10/02/2017 07/01/2013  WBC 4.0 - 10.5 K/uL 6.7 11.4(H) 12.1(H)  Hemoglobin 12.0 - 15.0 g/dL 9.2(L) 13.6 12.9  Hematocrit 36.0 - 46.0 % 31.3(L) 40.8 39.6   Platelets 150 - 400 K/uL 297 150 228   CMP Latest Ref Rng & Units 01/17/2019 11/25/2018 10/02/2017  Glucose 70 - 99 mg/dL 275(H) -  270(H)  BUN 8 - 23 mg/dL 22 - 23(H)  Creatinine 0.44 - 1.00 mg/dL 0.78 0.70 0.79  Sodium 135 - 145 mmol/L 139 - 133(L)  Potassium 3.5 - 5.1 mmol/L 5.1 - 4.3  Chloride 98 - 111 mmol/L 110 - 101  CO2 22 - 32 mmol/L 21(L) - 22  Calcium 8.9 - 10.3 mg/dL 10.4(H) - 9.7  Total Protein 6.5 - 8.1 g/dL - - 7.0  Total Bilirubin 0.3 - 1.2 mg/dL - - 0.8  Alkaline Phos 38 - 126 U/L - - 47  AST 15 - 41 U/L - - 23  ALT 14 - 54 U/L - - 12(L)    Imaging: Radiology review:  CLINICAL DATA:  LEFT lumpectomy in 2019.Asymptomatic today. History of benign excisional biopsy RIGHT breast.  EXAM: DIGITAL DIAGNOSTIC BILATERAL MAMMOGRAM WITH CAD AND TOMO  COMPARISON:  05/18/2018 and earlier  ACR Breast Density Category b: There are scattered areas of fibroglandular density.  FINDINGS: Postoperative changes are identified in both breasts. No suspicious mass, distortion, or microcalcifications are identified to suggest presence of malignancy.  Mammographic images were processed with CAD.  IMPRESSION: No mammographic evidence for malignancy.  RECOMMENDATION: Diagnostic mammogram is suggested in 1 year. (Code:DM-B-01Y)  I have discussed the findings and recommendations with the patient. If applicable, a reminder letter will be sent to the patient regarding the next appointment.  BI-RADS CATEGORY  2: Benign.   Electronically Signed   By: Nolon Nations M.D.   On: 06/21/2019 11:36  Within last 24 hrs: No results found.  Assessment    History of left breast cancer, no evidence of disease. Patient Active Problem List   Diagnosis Date Noted  . Coagulation disorder ([place]) 05/30/2019  . Ureteral stone with hydronephrosis 11/26/2018  . Pain due to onychomycosis of toenail of right foot 10/18/2018  . S/P bilateral below knee amputation ([place]) 10/18/2018   . Primary cancer of upper outer quadrant of left female breast ([place]) 06/12/2017  . Diarrhea 01/16/2017  . Hematuria 01/12/2017  . Chronic diarrhea 01/12/2017  . 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 Right leg swelling 10/13/2016  . Microscopic hematuria 06/02/2015  . Benign breast lumps 05/30/2015  . Cancer ([place]) 05/30/2015  . Depression 05/30/2015  . Diabetes mellitus type 2, uncomplicated ([place]) 21/97/5883  . Hyperlipidemia, unspecified 05/30/2015  . Hypertension 05/30/2015  . Osteoporosis, post-menopausal 05/30/2015  . Panic attacks 05/30/2015    Plan    She will continue her letrozole, repeat imaging in 1 year with follow-up clinical examination.  Face-to-face time spent with the patient and accompanying care providers(if present) was 30 minutes, with more than 50% of the time spent counseling, educating, and coordinating care of the patient.      Ronny Bacon M.D., FACS 06/30/2019, 1:21 PM

## 2019-06-30 NOTE — Patient Instructions (Addendum)
Dr.Rodenberg discussed with patient that as long as patient is comfortable with continuing mammograms with her age then she may resume.   Breast Self-Awareness Breast self-awareness means being familiar with how your breasts look and feel. It involves checking your breasts regularly and reporting any changes to your health care provider. Practicing breast self-awareness is important. Sometimes changes may not be harmful (are benign), but sometimes a change in your breasts can be a sign of a serious medical problem. It is important to learn how to do this procedure correctly so that you can catch problems early, when treatment is more likely to be successful. All women should practice breast self-awareness, including women who have had breast implants. What you need:  A mirror.  A well-lit room. How to do a breast self-exam A breast self-exam is one way to learn what is normal for your breasts and whether your breasts are changing. To do a breast self-exam: Look for changes  1. Remove all the clothing above your waist. 2. Stand in front of a mirror in a room with good lighting. 3. Put your hands on your hips. 4. Push your hands firmly downward. 5. Compare your breasts in the mirror. Look for differences between them (asymmetry), such as: ? Differences in shape. ? Differences in size. ? Puckers, dips, and bumps in one breast and not the other. 6. Look at each breast for changes in the skin, such as: ? Redness. ? Scaly areas. 7. Look for changes in your nipples, such as: ? Discharge. ? Bleeding. ? Dimpling. ? Redness. ? A change in position. Feel for changes Carefully feel your breasts for lumps and changes. It is best to do this while lying on your back on the floor, and again while sitting or standing in the tub or shower with soapy water on your skin. Feel each breast in the following way: 1. Place the arm on the side of the breast you are examining above your head. 2. Feel your  breast with the other hand. 3. Start in the nipple area and make -inch (2 cm) overlapping circles to feel your breast. Use the pads of your three middle fingers to do this. Apply light pressure, then medium pressure, then firm pressure. The light pressure will allow you to feel the tissue closest to the skin. The medium pressure will allow you to feel the tissue that is a little deeper. The firm pressure will allow you to feel the tissue close to the ribs. 4. Continue the overlapping circles, moving downward over the breast until you feel your ribs below your breast. 5. Move one finger-width toward the center of the body. Continue to use the -inch (2 cm) overlapping circles to feel your breast as you move slowly up toward your collarbone. 6. Continue the up-and-down exam using all three pressures until you reach your armpit.  Write down what you find Writing down what you find can help you remember what to discuss with your health care provider. Write down:  What is normal for each breast.  Any changes that you find in each breast, including: ? The kind of changes you find. ? Any pain or tenderness. ? Size and location of any lumps.  Where you are in your menstrual cycle, if you are still menstruating. General tips and recommendations  Examine your breasts every month.  If you are breastfeeding, the best time to examine your breasts is after a feeding or after using a breast pump.  If you menstruate,  the best time to examine your breasts is 5-7 days after your period. Breasts are generally lumpier during menstrual periods, and it may be more difficult to notice changes.  With time and practice, you will become more familiar with the variations in your breasts and more comfortable with the exam. Contact a health care provider if you:  See a change in the shape or size of your breasts or nipples.  See a change in the skin of your breast or nipples, such as a reddened or scaly  area.  Have unusual discharge from your nipples.  Find a lump or thick area that was not there before.  Have pain in your breasts.  Have any concerns related to your breast health. Summary  Breast self-awareness includes looking for physical changes in your breasts, as well as feeling for any changes within your breasts.  Breast self-awareness should be performed in front of a mirror in a well-lit room.  You should examine your breasts every month. If you menstruate, the best time to examine your breasts is 5-7 days after your menstrual period.  Let your health care provider know of any changes you notice in your breasts, including changes in size, changes on the skin, pain or tenderness, or unusual fluid from your nipples. This information is not intended to replace advice given to you by your health care provider. Make sure you discuss any questions you have with your health care provider. Document Revised: 12/08/2017 Document Reviewed: 12/08/2017 Elsevier Patient Education  [place].

## 2019-07-04 ENCOUNTER — Ambulatory Visit: Payer: Medicare Other | Admitting: Urology

## 2019-07-13 ENCOUNTER — Ambulatory Visit (INDEPENDENT_AMBULATORY_CARE_PROVIDER_SITE_OTHER): Payer: Medicare Other | Admitting: Urology

## 2019-07-13 ENCOUNTER — Other Ambulatory Visit: Payer: Self-pay

## 2019-07-13 ENCOUNTER — Encounter: Payer: Self-pay | Admitting: Urology

## 2019-07-13 ENCOUNTER — Ambulatory Visit
Admission: RE | Admit: 2019-07-13 | Discharge: 2019-07-13 | Disposition: A | Discharge status: Home / Self Care | Payer: Medicare Other | Source: Ambulatory Visit | Attending: Urology | Admitting: Urology

## 2019-07-13 VITALS — BP 131/65 | HR 92 | Ht 60.0 in | Wt 160.0 lb

## 2019-07-13 DIAGNOSIS — N200 Calculus of kidney: Secondary | ICD-10-CM

## 2019-07-13 NOTE — Patient Instructions (Signed)
Dietary Guidelines to Help Prevent Kidney Stones Kidney stones are deposits of minerals and salts that form inside your kidneys. Your risk of developing kidney stones may be greater depending on your diet, your lifestyle, the medicines you take, and whether you have certain medical conditions. Most people can reduce their chances of developing kidney stones by following the instructions below. Depending on your overall health and the type of kidney stones you tend to develop, your dietitian may give you more specific instructions. What are tips for following this plan? Reading food labels  Choose foods with "no salt added" or "low-salt" labels. Limit your sodium intake to less than 1500 mg per day.  Choose foods with calcium for each meal and snack. Try to eat about 300 mg of calcium at each meal. Foods that contain 200-500 mg of calcium per serving include: ? 8 oz (237 ml) of milk, fortified nondairy milk, and fortified fruit juice. ? 8 oz (237 ml) of kefir, yogurt, and soy yogurt. ? 4 oz (118 ml) of tofu. ? 1 oz of cheese. ? 1 cup (300 g) of dried figs. ? 1 cup (91 g) of cooked broccoli. ? 1-3 oz can of sardines or mackerel.  Most people need 1000 to 1500 mg of calcium each day. Talk to your dietitian about how much calcium is recommended for you. Shopping  Buy plenty of fresh fruits and vegetables. Most people do not need to avoid fruits and vegetables, even if they contain nutrients that may contribute to kidney stones.  When shopping for convenience foods, choose: ? Whole pieces of fruit. ? Premade salads with dressing on the side. ? Low-fat fruit and yogurt smoothies.  Avoid buying frozen meals or prepared deli foods.  Look for foods with live cultures, such as yogurt and kefir. Cooking  Do not add salt to food when cooking. Place a salt shaker on the table and allow each person to add his or her own salt to taste.  Use vegetable protein, such as beans, textured vegetable  protein (TVP), or tofu instead of meat in pasta, casseroles, and soups. Meal planning   Eat less salt, if told by your dietitian. To do this: ? Avoid eating processed or premade food. ? Avoid eating fast food.  Eat less animal protein, including cheese, meat, poultry, or fish, if told by your dietitian. To do this: ? Limit the number of times you have meat, poultry, fish, or cheese each week. Eat a diet free of meat at least 2 days a week. ? Eat only one serving each day of meat, poultry, fish, or seafood. ? When you prepare animal protein, cut pieces into small portion sizes. For most meat and fish, one serving is about the size of one deck of cards.  Eat at least 5 servings of fresh fruits and vegetables each day. To do this: ? Keep fruits and vegetables on hand for snacks. ? Eat 1 piece of fruit or a handful of berries with breakfast. ? Have a salad and fruit at lunch. ? Have two kinds of vegetables at dinner.  Limit foods that are high in a substance called oxalate. These include: ? Spinach. ? Rhubarb. ? Beets. ? Potato chips and french fries. ? Nuts.  If you regularly take a diuretic medicine, make sure to eat at least 1-2 fruits or vegetables high in potassium each day. These include: ? Avocado. ? Banana. ? Orange, prune, carrot, or tomato juice. ? Baked potato. ? Cabbage. ? Beans and split   peas. General instructions   Drink enough fluid to keep your urine clear or pale yellow. This is the most important thing you can do.  Talk to your health care provider and dietitian about taking daily supplements. Depending on your health and the cause of your kidney stones, you may be advised: ? Not to take supplements with vitamin C. ? To take a calcium supplement. ? To take a daily probiotic supplement. ? To take other supplements such as magnesium, fish oil, or vitamin B6.  Take all medicines and supplements as told by your health care provider.  Limit alcohol intake to no  more than 1 drink a day for nonpregnant women and 2 drinks a day for men. One drink equals 12 oz of beer, 5 oz of wine, or 1 oz of hard liquor.  Lose weight if told by your health care provider. Work with your dietitian to find strategies and an eating plan that works best for you. What foods are not recommended? Limit your intake of the following foods, or as told by your dietitian. Talk to your dietitian about specific foods you should avoid based on the type of kidney stones and your overall health. Grains Breads. Bagels. Rolls. Baked goods. Salted crackers. Cereal. Pasta. Vegetables Spinach. Rhubarb. Beets. Canned vegetables. Pickles. Olives. Meats and other protein foods Nuts. Nut butters. Large portions of meat, poultry, or fish. Salted or cured meats. Deli meats. Hot dogs. Sausages. Dairy Cheese. Beverages Regular soft drinks. Regular vegetable juice. Seasonings and other foods Seasoning blends with salt. Salad dressings. Canned soups. Soy sauce. Ketchup. Barbecue sauce. Canned pasta sauce. Casseroles. Pizza. Lasagna. Frozen meals. Potato chips. French fries. Summary  You can reduce your risk of kidney stones by making changes to your diet.  The most important thing you can do is drink enough fluid. You should drink enough fluid to keep your urine clear or pale yellow.  Ask your health care provider or dietitian how much protein from animal sources you should eat each day, and also how much salt and calcium you should have each day. This information is not intended to replace advice given to you by your health care provider. Make sure you discuss any questions you have with your health care provider. Document Revised: 08/11/2018 Document Reviewed: 04/01/2016 Elsevier Patient Education  2020 Elsevier Inc.  

## 2019-07-13 NOTE — Progress Notes (Signed)
   07/13/2019 4:14 PM   Julie Acosta February 06, 1934 [id number]  Reason for visit: Follow up nephrolithiasis  HPI: I saw Julie Acosta in urology clinic for follow-up of nephrolithiasis.  She is a co-morbid 86 year old female that underwent bilateral ureteroscopy for extensive bilateral ureteral stone disease on 12/17/2018 with treatment of all of her stone.  She denies any complaints since then and has not had any gross hematuria or flank pain.  I personally reviewed her KUB today and there is no obvious evidence of nephrolithiasis.  We discussed general stone prevention strategies including adequate hydration with goal of producing 2.5 L of urine daily, increasing citric acid intake, increasing calcium intake during high oxalate meals, minimizing animal protein, and decreasing salt intake. Information about dietary recommendations given today.   I recommended considering ongoing yearly KUB surveillance with her extensive history of stones, but she would like to follow-up on an as-needed basis.  We discussed return precautions at length including gross hematuria or flank pain.  I spent 20 total minutes on the day of the encounter including pre-visit review of the medical record, face-to-face time with the patient, and post visit ordering of labs/imaging/tests.  Billey Co, [facility] [address], [place], [place] [address] [phone]

## 2019-07-14 ENCOUNTER — Other Ambulatory Visit: Payer: Self-pay | Admitting: Ophthalmology

## 2019-07-14 DIAGNOSIS — H4912 Fourth [trochlear] nerve palsy, left eye: Secondary | ICD-10-CM

## 2019-07-14 NOTE — Progress Notes (Signed)
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  Oncotype DX score 1.  INTERVAL HISTORY: Patient returns to clinic today for routine 16-monthevaluation and continuation of Prolia.  She currently feels well and is at her baseline.  She is tolerating letrozole without significant side effects.  She has no neurologic complaints.  She denies any recent fevers or illnesses.  She has a good appetite and denies weight loss.  She denies any chest pain, shortness of breath, cough, or hemoptysis.  She denies any nausea, vomiting, constipation, or diarrhea.  She has no urinary complaints.  Patient offers no specific complaints today.  REVIEW OF SYSTEMS:   Review of Systems  Constitutional: Negative.  Negative for fever, malaise/fatigue and weight loss.  Respiratory: Negative.  Negative for cough and shortness of breath.   Cardiovascular: Negative.  Negative for chest pain and leg swelling.  Gastrointestinal: Negative.  Negative for abdominal pain.  Genitourinary: Negative.  Negative for dysuria.  Musculoskeletal: Negative.  Negative for joint pain.  Skin: Negative.  Negative for rash.  Neurological: Negative.  Negative for dizziness, sensory change, weakness and headaches.  Psychiatric/Behavioral: Negative.  The patient is not nervous/anxious.     As per HPI. Otherwise, a complete review of systems is negative.   PAST MEDICAL HISTORY: Reviewed and unchanged.  PAST SURGICAL HISTORY: Past Surgical History:  Procedure Laterality Date  . APPENDECTOMY  1962  . BREAST BIOPSY Right 2006?   benign  . BREAST BIOPSY Left 2019   invasive mammary carcinoma    . BREAST CYST EXCISION Left 07/27/2017   Procedure: SKIN CYST EXCISED;  Surgeon: BRobert Bellow MD;  Location: [facility];  Service: General;  Laterality: Left;  . BREAST LUMPECTOMY Left 07/2017   invasive mammary, DCIS  mammosite  . BREAST SURGERY Right 2019  . CARPAL TUNNEL RELEASE Right   . CATARACT EXTRACTION W/PHACO Left 11/11/2016   Procedure: CATARACT EXTRACTION PHACO AND INTRAOCULAR LENS PLACEMENT (IOC);  Surgeon: PBirder Robson MD;  Location: [facility];  Service: Ophthalmology;  Laterality: Left;  [place]01:07.9 AP% 19.2 CDE 13.02 Fluid pack lot # [id number]  . CATARACT EXTRACTION W/PHACO Right 12/09/2016   Procedure: CATARACT EXTRACTION PHACO AND INTRAOCULAR LENS PLACEMENT (IOC);  Surgeon: PBirder Robson MD;  Location: [facility];  Service: Ophthalmology;  Laterality: Right;  [place]00:49 AP% 21.2 CDE 10.39 Fluid pack lot # [id number]  . CHOLECYSTECTOMY    . COLONOSCOPY WITH PROPOFOL N/A 11/22/2014   Procedure: COLONOSCOPY WITH PROPOFOL;  Surgeon: RManya Silvas MD;  [facility];  Service: Endoscopy;  Laterality: N/A;  . CYSTOSCOPY WITH STENT PLACEMENT Bilateral 11/27/2018   Procedure: CYSTOSCOPY WITH STENT PLACEMENT;  Surgeon: EFestus Aloe MD;  Location: [facility];  Service: Urology;  Laterality: Bilateral;  . CYSTOSCOPY/URETEROSCOPY/HOLMIUM LASER/STENT PLACEMENT Bilateral 12/17/2018   Procedure: CYSTOSCOPY/URETEROSCOPY/HOLMIUM LASER/STENT Exchange;  Surgeon: SBilley Co MD;  Location: [facility];  Service: Urology;  Laterality: Bilateral;  . ESOPHAGOGASTRODUODENOSCOPY  11/22/2014   Procedure: ESOPHAGOGASTRODUODENOSCOPY (EGD);  Surgeon: RManya Silvas MD;  [facility];  Service: Endoscopy;;  . ESOPHAGOGASTRODUODENOSCOPY (EGD) WITH PROPOFOL N/A 03/05/2017   Procedure: ESOPHAGOGASTRODUODENOSCOPY (EGD) WITH PROPOFOL;  Surgeon: VLin Landsman MD;  [facility];  Service: Gastroenterology;  Laterality: N/A;  . EXTRACORPOREAL SHOCK WAVE  LITHOTRIPSY Right 03/04/2018   Procedure: EXTRACORPOREAL SHOCK WAVE LITHOTRIPSY (ESWL);  Surgeon: Billey Co, MD;  Location: [facility];  Service: Urology;  Laterality: Right;  . EYE SURGERY Bilateral 2012   cataract extraction with iol  . GALLBLADDER SURGERY  1968  . hemi pelvectomy     left side at age 63  . HEMIPELVIC Left 1962  . LEG SURGERY Left    AMPUTATION d/t cancer at 86 years old  . MASTECTOMY, PARTIAL Left 07/27/2017   Procedure: MASTECTOMY PARTIAL;  Surgeon: Robert Bellow, MD;  Location: [facility];  Service: General;  Laterality: Left;  . MOUTH SURGERY  2019   teeth pulled with a bridge insertion.   fell out 12/16/18  . OVARY SURGERY Right    cyst removed  . SAVORY DILATION  11/22/2014   Procedure: SAVORY DILATION;  Surgeon: Manya Silvas, MD;  [facility];  Service: Endoscopy;;  . SENTINEL NODE BIOPSY Left 07/27/2017   Procedure: SENTINEL NODE BIOPSY;  Surgeon: Robert Bellow, MD;  Location: [facility];  Service: General;  Laterality: Left;  . SKIN CANCER EXCISION     nose and arm and face  . TONSILLECTOMY      FAMILY HISTORY: Family History  Problem Relation Age of Onset  . Breast cancer Paternal Aunt   . Ovarian cancer Sister   . Prostate cancer Father   . Stroke Mother   . Kidney cancer Neg Hx   . Bladder Cancer Neg Hx     ADVANCED DIRECTIVES (Y/N):  N  HEALTH MAINTENANCE: Social History   Tobacco Use  . Smoking status: Never Smoker  . Smokeless tobacco: Never Used  Substance Use Topics  . Alcohol use: No  . Drug use: No     Colonoscopy:  PAP:  Bone density:  Lipid panel:  Allergies  Allergen Reactions  . Ciprofloxacin Hives  . Codeine Other (See Comments)    HALLUCINATIONS  . Tape     Rash and skin irritation/ paper tape and tegaderm OK  . Ciprocinonide [Fluocinolone] Other (See Comments)    unknown  . Amoxicillin Other (See Comments)    Upset stomach Has patient had a PCN reaction causing immediate rash,  facial/tongue/throat swelling, SOB or lightheadedness with hypotension: No Has patient had a PCN reaction causing severe rash involving mucus membranes or skin necrosis: No Has patient had a PCN reaction that required hospitalization: No Has patient had a PCN reaction occurring within the last 10 years: Yes If all of the above answers are "NO", then may proceed with Cephalosporin use.;  . Cefuroxime Other (See Comments)    OK INTRACAMERALLY PER DR WLP, upset stomach  . Lipitor [Atorvastatin] Other (See Comments)    unknown    Current Outpatient Medications  Medication Sig Dispense Refill  . CLOBETASOL PROPIONATE EX Apply 1 Dose topically 2 (two) times daily. Lt ear    . fenofibrate 160 MG tablet TAKE ONE TABLET BY MOUTH EVERY DAY    . GLIPIZIDE XL 2.5 MG 24 hr tablet Take 2.5 mg by mouth daily.     Marland Kitchen letrozole (FEMARA) 2.5 MG tablet Take 1 tablet (2.5 mg total) by mouth daily. 30 tablet 3  . losartan (COZAAR) 50 MG tablet TAKE 1 TABLET BY MOUTH DAILY    . metFORMIN (GLUCOPHAGE) 500 MG tablet Take 1,000 mg by mouth 2 (two) times daily.     . metoprolol succinate (TOPROL-XL) 25 MG  24 hr tablet Take 12.5 mg by mouth at bedtime.     Marland Kitchen omeprazole (PRILOSEC) 40 MG capsule Take 40 mg by mouth daily.     Marland Kitchen PARoxetine (PAXIL) 40 MG tablet Take 40 mg by mouth daily.     Vladimir Faster Glycol-Propyl Glycol 0.4-0.3 % SOLN Apply to eye 2 (two) times daily as needed.    . raloxifene (EVISTA) 60 MG tablet Take 60 mg by mouth daily.     . rivaroxaban (XARELTO) 20 MG TABS tablet Take 20 mg by mouth daily with supper.     No current facility-administered medications for this visit.   Facility-Administered Medications Ordered in Other Visits  Medication Dose Route Frequency Provider Last Rate Last Admin  . denosumab (PROLIA) injection 60 mg  60 mg Subcutaneous Once Lloyd Huger, MD        OBJECTIVE: Vitals:   07/19/19 1105 07/19/19 1108  BP: (!) 134/40   Pulse: 91   Resp: 18   Temp: 98.2 F  (36.8 C) 98.2 F (36.8 C)     Body mass index is 31.44 kg/m.    ECOG FS:0 - Asymptomatic  General: Well-developed, well-nourished, no acute distress.  Sitting in wheelchair. Eyes: Pink conjunctiva, anicteric sclera. HEENT: Normocephalic, moist mucous membranes. Lungs: No audible wheezing or coughing. Heart: Regular rate and rhythm. Abdomen: Soft, nontender, no obvious distention. Musculoskeletal: No edema, cyanosis, or clubbing.  Left leg amputation. Neuro: Alert, answering all questions appropriately. Cranial nerves grossly intact. Skin: No rashes or petechiae noted. Psych: Normal affect.  LAB RESULTS:  Lab Results  Component Value Date   NA 137 07/19/2019   K 4.7 07/19/2019   CL 108 07/19/2019   CO2 20 (L) 07/19/2019   GLUCOSE 213 (H) 07/19/2019   BUN 23 07/19/2019   CREATININE 0.78 07/19/2019   CALCIUM 10.1 07/19/2019   PROT 7.0 10/02/2017   ALBUMIN 3.7 10/02/2017   AST 23 10/02/2017   ALT 12 (L) 10/02/2017   ALKPHOS 47 10/02/2017   BILITOT 0.8 10/02/2017   GFRNONAA >60 07/19/2019   GFRAA >60 07/19/2019    Lab Results  Component Value Date   WBC 7.5 07/19/2019   NEUTROABS 5.1 07/19/2019   HGB 9.4 (L) 07/19/2019   HCT 33.4 (L) 07/19/2019   MCV 74.9 (L) 07/19/2019   PLT 371 07/19/2019     STUDIES: Abdomen 1 view (KUB)  Result Date: 07/14/2019 CLINICAL DATA:  Kidney stones, history of nephrolithiasis. EXAM: ABDOMEN - 1 VIEW COMPARISON:  03/18/2018 FINDINGS: Nephrolithiasis on the right the least 4-5 calculi noted largest approximately 6 mm. Stool and gas overlie the left renal contour. Calcific density projecting over the medial aspect of the renal contour approximately 5 mm, potentially in the left renal pelvis. Lung bases are clear. Signs of left hemipelvectomy and spinal degenerative change. Scattered loops of gas-filled small bowel with gas and stool throughout the colon. No signs of obstruction. IMPRESSION: 1. Multiple right-sided renal calculi, largest  approximately 5-6 mm. 2. Possible 5 mm left renal calculus. 3. Nonobstructive bowel gas pattern. 4. Changes of left hemipelvectomy. Electronically Signed   By: Zetta Bills M.D.   On: 07/14/2019 08:36   MM DIAG BREAST TOMO BILATERAL  Result Date: 06/21/2019 CLINICAL DATA:  LEFT lumpectomy in 2019.Asymptomatic today. History of benign excisional biopsy RIGHT breast. EXAM: DIGITAL DIAGNOSTIC BILATERAL MAMMOGRAM WITH CAD AND TOMO COMPARISON:  05/18/2018 and earlier ACR Breast Density Category b: There are scattered areas of fibroglandular density. FINDINGS: Postoperative changes are identified in both  breasts. No suspicious mass, distortion, or microcalcifications are identified to suggest presence of malignancy. Mammographic images were processed with CAD. IMPRESSION: No mammographic evidence for malignancy. RECOMMENDATION: Diagnostic mammogram is suggested in 1 year. (Code:[id number]-B-01Y) I have discussed the findings and recommendations with the patient. If applicable, a reminder letter will be sent to the patient regarding the next appointment. BI-RADS CATEGORY  2: Benign. Electronically Signed   By: Nolon Nations M.D.   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  Oncotype DX score 1  PLAN:    1.  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  Patient had a lumpectomy on July 27, 2017.  She completed radiation with MammoSite treatment.  Continue letrozole for a total of 5 years completing treatment in May 2024.  Her most recent mammogram on June 21, 2019 was reported as BI-RADS 2.  Repeat in February 2022.  Return to clinic in 6 months for routine evaluation.   2.  Osteopenia: Patient's most recent bone mineral density on October 13, 2018 reported T score of -2.1 which is improved from 1 year prior when the T score was reported -2.4. Patient refused Fosamax, but has agreed to Prolia every 6 months.  Proceed with Prolia today.  Continue calcium and vitamin D supplementation.  Return to clinic in 6 months for further evaluation and continuation of treatment.  If patient's T score continues to improve, can consider discontinuing treatment. 3.  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  Patient expressed understanding and was in agreement with this plan. She also understands that She can call clinic at any time with any questions, concerns, or complaints.   Cancer Staging Primary cancer of upper outer quadrant of left female breast [facility]) Staging form: Breast, AJCC 8th Edition - Clinical stage from 06/12/2017: Stage IA (cT1b, cN0, cM0, G2, ER: Positive, PR: Positive, HER2: Negative) - Signed by Lloyd Huger, MD on 06/12/2017   Lloyd Huger, MD   07/19/2019 11:48 AM

## 2019-07-19 ENCOUNTER — Inpatient Hospital Stay: Payer: Medicare Other | Attending: Oncology | Admitting: Oncology

## 2019-07-19 ENCOUNTER — Inpatient Hospital Stay: Payer: Medicare Other

## 2019-07-19 ENCOUNTER — Encounter: Payer: Self-pay | Admitting: Oncology

## 2019-07-19 VITALS — BP 134/40 | HR 91 | Temp 98.2°F | Resp 18 | Wt 161.0 lb

## 2019-07-19 DIAGNOSIS — Z170 Estrogen receptor positive status [ER+]: Secondary | ICD-10-CM | POA: Insufficient documentation

## 2019-07-19 DIAGNOSIS — Z87442 Personal history of urinary calculi: Secondary | ICD-10-CM | POA: Insufficient documentation

## 2019-07-19 DIAGNOSIS — M810 Age-related osteoporosis without current pathological fracture: Secondary | ICD-10-CM

## 2019-07-19 DIAGNOSIS — Z7984 Long term (current) use of oral hypoglycemic drugs: Secondary | ICD-10-CM | POA: Diagnosis not present

## 2019-07-19 DIAGNOSIS — C50412 Malignant neoplasm of upper-outer quadrant of left female breast: Secondary | ICD-10-CM

## 2019-07-19 DIAGNOSIS — Z79899 Other long term (current) drug therapy: Secondary | ICD-10-CM | POA: Diagnosis not present

## 2019-07-19 DIAGNOSIS — Z7901 Long term (current) use of anticoagulants: Secondary | ICD-10-CM | POA: Diagnosis not present

## 2019-07-19 DIAGNOSIS — M8580 Other specified disorders of bone density and structure, unspecified site: Secondary | ICD-10-CM | POA: Diagnosis not present

## 2019-07-19 DIAGNOSIS — Z79811 Long term (current) use of aromatase inhibitors: Secondary | ICD-10-CM | POA: Diagnosis not present

## 2019-07-19 LAB — CBC WITH DIFFERENTIAL/PLATELET
Abs Immature Granulocytes: 0.03 10*3/uL (ref 0.00–0.07)
Basophils Absolute: 0.1 10*3/uL (ref 0.0–0.1)
Basophils Relative: 1 %
Eosinophils Absolute: 0.1 10*3/uL (ref 0.0–0.5)
Eosinophils Relative: 2 %
HCT: 33.4 % — ABNORMAL LOW (ref 36.0–46.0)
Hemoglobin: 9.4 g/dL — ABNORMAL LOW (ref 12.0–15.0)
Immature Granulocytes: 0 %
Lymphocytes Relative: 22 %
Lymphs Abs: 1.7 10*3/uL (ref 0.7–4.0)
MCH: 21.1 pg — ABNORMAL LOW (ref 26.0–34.0)
MCHC: 28.1 g/dL — ABNORMAL LOW (ref 30.0–36.0)
MCV: 74.9 fL — ABNORMAL LOW (ref 80.0–100.0)
Monocytes Absolute: 0.5 10*3/uL (ref 0.1–1.0)
Monocytes Relative: 7 %
Neutro Abs: 5.1 10*3/uL (ref 1.7–7.7)
Neutrophils Relative %: 68 %
Platelets: 371 10*3/uL (ref 150–400)
RBC: 4.46 MIL/uL (ref 3.87–5.11)
RDW: 20.5 % — ABNORMAL HIGH (ref 11.5–15.5)
WBC: 7.5 10*3/uL (ref 4.0–10.5)
nRBC: 0 % (ref 0.0–0.2)

## 2019-07-19 LAB — BASIC METABOLIC PANEL
Anion gap: 9 (ref 5–15)
BUN: 23 mg/dL (ref 8–23)
CO2: 20 mmol/L — ABNORMAL LOW (ref 22–32)
Calcium: 10.1 mg/dL (ref 8.9–10.3)
Chloride: 108 mmol/L (ref 98–111)
Creatinine, Ser: 0.78 mg/dL (ref 0.44–1.00)
GFR calc Af Amer: >60 mL/min (ref >60)
GFR calc non Af Amer: >60 mL/min (ref >60)
Glucose, Bld: 213 mg/dL — ABNORMAL HIGH (ref 70–99)
Potassium: 4.7 mmol/L (ref 3.5–5.1)
Sodium: 137 mmol/L (ref 135–145)

## 2019-07-19 MED ORDER — DENOSUMAB 60 MG/ML ~~LOC~~ SOSY
60.0000 mg | PREFILLED_SYRINGE | Freq: Once | SUBCUTANEOUS | Status: AC
  Administered 2019-07-19: 60 mg via SUBCUTANEOUS
  Filled 2019-07-19: qty 1

## 2019-07-23 ENCOUNTER — Other Ambulatory Visit: Payer: Self-pay

## 2019-07-23 ENCOUNTER — Inpatient Hospital Stay
Admission: EM | Admit: 2019-07-23 | Discharge: 2019-07-27 | DRG: 816 | Disposition: A | Discharge status: Home Health | Payer: Medicare Other | Attending: Internal Medicine | Admitting: Internal Medicine

## 2019-07-23 ENCOUNTER — Emergency Department: Payer: Medicare Other

## 2019-07-23 DIAGNOSIS — D649 Anemia, unspecified: Secondary | ICD-10-CM | POA: Diagnosis present

## 2019-07-23 DIAGNOSIS — I959 Hypotension, unspecified: Secondary | ICD-10-CM | POA: Diagnosis present

## 2019-07-23 DIAGNOSIS — Z79811 Long term (current) use of aromatase inhibitors: Secondary | ICD-10-CM

## 2019-07-23 DIAGNOSIS — D735 Infarction of spleen: Principal | ICD-10-CM | POA: Diagnosis present

## 2019-07-23 DIAGNOSIS — A419 Sepsis, unspecified organism: Secondary | ICD-10-CM | POA: Diagnosis not present

## 2019-07-23 DIAGNOSIS — I10 Essential (primary) hypertension: Secondary | ICD-10-CM | POA: Diagnosis present

## 2019-07-23 DIAGNOSIS — R109 Unspecified abdominal pain: Secondary | ICD-10-CM

## 2019-07-23 DIAGNOSIS — E1169 Type 2 diabetes mellitus with other specified complication: Secondary | ICD-10-CM

## 2019-07-23 DIAGNOSIS — Z85828 Personal history of other malignant neoplasm of skin: Secondary | ICD-10-CM | POA: Diagnosis not present

## 2019-07-23 DIAGNOSIS — Z66 Do not resuscitate: Secondary | ICD-10-CM | POA: Diagnosis present

## 2019-07-23 DIAGNOSIS — Z20822 Contact with and (suspected) exposure to covid-19: Secondary | ICD-10-CM | POA: Diagnosis present

## 2019-07-23 DIAGNOSIS — Z803 Family history of malignant neoplasm of breast: Secondary | ICD-10-CM | POA: Diagnosis not present

## 2019-07-23 DIAGNOSIS — Z8042 Family history of malignant neoplasm of prostate: Secondary | ICD-10-CM

## 2019-07-23 DIAGNOSIS — E669 Obesity, unspecified: Secondary | ICD-10-CM | POA: Diagnosis present

## 2019-07-23 DIAGNOSIS — I9589 Other hypotension: Secondary | ICD-10-CM | POA: Diagnosis not present

## 2019-07-23 DIAGNOSIS — Z7984 Long term (current) use of oral hypoglycemic drugs: Secondary | ICD-10-CM

## 2019-07-23 DIAGNOSIS — R791 Abnormal coagulation profile: Secondary | ICD-10-CM | POA: Diagnosis present

## 2019-07-23 DIAGNOSIS — Z923 Personal history of irradiation: Secondary | ICD-10-CM

## 2019-07-23 DIAGNOSIS — Z823 Family history of stroke: Secondary | ICD-10-CM

## 2019-07-23 DIAGNOSIS — Z6830 Body mass index (BMI) 30.0-30.9, adult: Secondary | ICD-10-CM | POA: Diagnosis not present

## 2019-07-23 DIAGNOSIS — Z85830 Personal history of malignant neoplasm of bone: Secondary | ICD-10-CM | POA: Diagnosis not present

## 2019-07-23 DIAGNOSIS — Z8041 Family history of malignant neoplasm of ovary: Secondary | ICD-10-CM

## 2019-07-23 DIAGNOSIS — F419 Anxiety disorder, unspecified: Secondary | ICD-10-CM | POA: Diagnosis present

## 2019-07-23 DIAGNOSIS — E119 Type 2 diabetes mellitus without complications: Secondary | ICD-10-CM | POA: Diagnosis present

## 2019-07-23 DIAGNOSIS — Z7901 Long term (current) use of anticoagulants: Secondary | ICD-10-CM

## 2019-07-23 DIAGNOSIS — T45515A Adverse effect of anticoagulants, initial encounter: Secondary | ICD-10-CM | POA: Diagnosis present

## 2019-07-23 DIAGNOSIS — Z86718 Personal history of other venous thrombosis and embolism: Secondary | ICD-10-CM | POA: Diagnosis not present

## 2019-07-23 DIAGNOSIS — Z881 Allergy status to other antibiotic agents status: Secondary | ICD-10-CM

## 2019-07-23 DIAGNOSIS — Z853 Personal history of malignant neoplasm of breast: Secondary | ICD-10-CM | POA: Diagnosis not present

## 2019-07-23 DIAGNOSIS — E1142 Type 2 diabetes mellitus with diabetic polyneuropathy: Secondary | ICD-10-CM

## 2019-07-23 DIAGNOSIS — E861 Hypovolemia: Secondary | ICD-10-CM | POA: Diagnosis not present

## 2019-07-23 HISTORY — DX: Infarction of spleen: D73.5

## 2019-07-23 LAB — COMPREHENSIVE METABOLIC PANEL
ALT: 13 U/L (ref 0–44)
AST: 32 U/L (ref 15–41)
Albumin: 4 g/dL (ref 3.5–5.0)
Alkaline Phosphatase: 50 U/L (ref 38–126)
Anion gap: 8 (ref 5–15)
BUN: 14 mg/dL (ref 8–23)
CO2: 23 mmol/L (ref 22–32)
Calcium: 10.7 mg/dL — ABNORMAL HIGH (ref 8.9–10.3)
Chloride: 105 mmol/L (ref 98–111)
Creatinine, Ser: 0.64 mg/dL (ref 0.44–1.00)
GFR calc Af Amer: >60 mL/min (ref >60)
GFR calc non Af Amer: >60 mL/min (ref >60)
Glucose, Bld: 241 mg/dL — ABNORMAL HIGH (ref 70–99)
Potassium: 4.6 mmol/L (ref 3.5–5.1)
Sodium: 136 mmol/L (ref 135–145)
Total Bilirubin: 0.6 mg/dL (ref 0.3–1.2)
Total Protein: 7.3 g/dL (ref 6.5–8.1)

## 2019-07-23 LAB — CBC
HCT: 34.5 % — ABNORMAL LOW (ref 36.0–46.0)
Hemoglobin: 9.5 g/dL — ABNORMAL LOW (ref 12.0–15.0)
MCH: 20.8 pg — ABNORMAL LOW (ref 26.0–34.0)
MCHC: 27.5 g/dL — ABNORMAL LOW (ref 30.0–36.0)
MCV: 75.5 fL — ABNORMAL LOW (ref 80.0–100.0)
Platelets: 277 10*3/uL (ref 150–400)
RBC: 4.57 MIL/uL (ref 3.87–5.11)
RDW: 20.3 % — ABNORMAL HIGH (ref 11.5–15.5)
WBC: 17.3 10*3/uL — ABNORMAL HIGH (ref 4.0–10.5)
nRBC: 0 % (ref 0.0–0.2)

## 2019-07-23 LAB — URINALYSIS, COMPLETE (UACMP) WITH MICROSCOPIC
Bacteria, UA: NONE SEEN
Bilirubin Urine: NEGATIVE
Glucose, UA: >=500 mg/dL — AB
Hgb urine dipstick: NEGATIVE
Ketones, ur: NEGATIVE mg/dL
Leukocytes,Ua: NEGATIVE
Nitrite: NEGATIVE
Protein, ur: NEGATIVE mg/dL
Specific Gravity, Urine: >1.046 — ABNORMAL HIGH (ref 1.005–1.030)
pH: 5 (ref 5.0–8.0)

## 2019-07-23 LAB — HEPARIN LEVEL (UNFRACTIONATED): Heparin Unfractionated: >3.6 IU/mL — ABNORMAL HIGH (ref 0.30–0.70)

## 2019-07-23 LAB — GLUCOSE, CAPILLARY: Glucose-Capillary: 201 mg/dL — ABNORMAL HIGH (ref 70–99)

## 2019-07-23 LAB — APTT: aPTT: 41 seconds — ABNORMAL HIGH (ref 24–36)

## 2019-07-23 LAB — HEMOGLOBIN A1C
Hgb A1c MFr Bld: 6.6 % — ABNORMAL HIGH (ref 4.8–5.6)
Mean Plasma Glucose: 142.72 mg/dL

## 2019-07-23 LAB — PROCALCITONIN: Procalcitonin: 0.17 ng/mL

## 2019-07-23 LAB — LACTIC ACID, PLASMA: Lactic Acid, Venous: 2.4 mmol/L (ref 0.5–1.9)

## 2019-07-23 LAB — POC SARS CORONAVIRUS 2 AG: SARS Coronavirus 2 Ag: NEGATIVE

## 2019-07-23 LAB — LIPASE, BLOOD: Lipase: 40 U/L (ref 11–51)

## 2019-07-23 MED ORDER — IBUPROFEN 100 MG/5ML PO SUSP: 100.0000 mg | Freq: Three times a day (TID) | ORAL | Status: DC | PRN

## 2019-07-23 MED ORDER — LACTATED RINGERS IV BOLUS
1000.0000 mL | Freq: Once | INTRAVENOUS | Status: AC
  Administered 2019-07-23: 1000 mL via INTRAVENOUS

## 2019-07-23 MED ORDER — VANCOMYCIN HCL 750 MG/150ML IV SOLN
750.0000 mg | Freq: Once | INTRAVENOUS | Status: AC
  Administered 2019-07-24: 750 mg via INTRAVENOUS
  Filled 2019-07-23: qty 150

## 2019-07-23 MED ORDER — IOHEXOL 300 MG/ML  SOLN
100.0000 mL | Freq: Once | INTRAMUSCULAR | Status: AC | PRN
  Administered 2019-07-23: 100 mL via INTRAVENOUS

## 2019-07-23 MED ORDER — ADULT MULTIVITAMIN W/MINERALS CH
1.0000 | ORAL_TABLET | Freq: Every day | ORAL | Status: DC
  Administered 2019-07-24 – 2019-07-27 (×3): 1 via ORAL
  Filled 2019-07-23 (×3): qty 1

## 2019-07-23 MED ORDER — LETROZOLE 2.5 MG PO TABS
2.5000 mg | ORAL_TABLET | Freq: Every day | ORAL | Status: DC
  Administered 2019-07-24 – 2019-07-27 (×4): 2.5 mg via ORAL
  Filled 2019-07-23 (×5): qty 1

## 2019-07-23 MED ORDER — FENOFIBRATE 160 MG PO TABS
160.0000 mg | ORAL_TABLET | Freq: Every day | ORAL | Status: DC
  Administered 2019-07-24 – 2019-07-27 (×4): 160 mg via ORAL
  Filled 2019-07-23 (×5): qty 1

## 2019-07-23 MED ORDER — ACETAMINOPHEN 325 MG PO TABS
650.0000 mg | ORAL_TABLET | Freq: Four times a day (QID) | ORAL | Status: DC | PRN
  Administered 2019-07-24 – 2019-07-26 (×6): 650 mg via ORAL
  Filled 2019-07-23 (×6): qty 2

## 2019-07-23 MED ORDER — INSULIN ASPART 100 UNIT/ML ~~LOC~~ SOLN
0.0000 [IU] | SUBCUTANEOUS | Status: DC
  Administered 2019-07-23: 5 [IU] via SUBCUTANEOUS
  Administered 2019-07-24 (×2): 3 [IU] via SUBCUTANEOUS
  Administered 2019-07-25 (×2): 2 [IU] via SUBCUTANEOUS
  Administered 2019-07-25: 3 [IU] via SUBCUTANEOUS
  Administered 2019-07-25: 2 [IU] via SUBCUTANEOUS
  Filled 2019-07-23 (×6): qty 1

## 2019-07-23 MED ORDER — PAROXETINE HCL 20 MG PO TABS
40.0000 mg | ORAL_TABLET | Freq: Every day | ORAL | Status: DC
  Administered 2019-07-24 – 2019-07-27 (×4): 40 mg via ORAL
  Filled 2019-07-23 (×4): qty 2

## 2019-07-23 MED ORDER — METOPROLOL SUCCINATE ER 25 MG PO TB24
12.5000 mg | ORAL_TABLET | Freq: Every day | ORAL | Status: DC
  Administered 2019-07-24 – 2019-07-26 (×3): 12.5 mg via ORAL
  Filled 2019-07-23 (×3): qty 1
  Filled 2019-07-23: qty 0.5

## 2019-07-23 MED ORDER — SODIUM CHLORIDE 0.9 % IV SOLN: Freq: Once | INTRAVENOUS | Status: AC

## 2019-07-23 MED ORDER — PANTOPRAZOLE SODIUM 40 MG PO TBEC
40.0000 mg | DELAYED_RELEASE_TABLET | Freq: Every day | ORAL | Status: DC
  Administered 2019-07-24 – 2019-07-27 (×4): 40 mg via ORAL
  Filled 2019-07-23 (×4): qty 1

## 2019-07-23 MED ORDER — SODIUM CHLORIDE 0.9 % IV SOLN
2.0000 g | Freq: Once | INTRAVENOUS | Status: AC
  Administered 2019-07-23: 2 g via INTRAVENOUS
  Filled 2019-07-23: qty 2

## 2019-07-23 MED ORDER — SODIUM CHLORIDE 0.9 % IV SOLN
1.0000 g | Freq: Three times a day (TID) | INTRAVENOUS | Status: DC
  Filled 2019-07-23 (×4): qty 1

## 2019-07-23 MED ORDER — SODIUM CHLORIDE 0.9% FLUSH
3.0000 mL | Freq: Two times a day (BID) | INTRAVENOUS | Status: DC
  Administered 2019-07-24 – 2019-07-26 (×5): 3 mL via INTRAVENOUS

## 2019-07-23 MED ORDER — SODIUM CHLORIDE 0.45 % IV SOLN: INTRAVENOUS | Status: DC

## 2019-07-23 MED ORDER — METRONIDAZOLE IN NACL 5-0.79 MG/ML-% IV SOLN
500.0000 mg | Freq: Once | INTRAVENOUS | Status: AC
  Administered 2019-07-23: 500 mg via INTRAVENOUS
  Filled 2019-07-23: qty 100

## 2019-07-23 MED ORDER — VANCOMYCIN HCL IN DEXTROSE 1-5 GM/200ML-% IV SOLN
1000.0000 mg | INTRAVENOUS | Status: DC
  Administered 2019-07-25: 1000 mg via INTRAVENOUS
  Filled 2019-07-23 (×2): qty 200

## 2019-07-23 MED ORDER — ACETAMINOPHEN 325 MG PO TABS
650.0000 mg | ORAL_TABLET | Freq: Once | ORAL | Status: AC
  Administered 2019-07-23: 650 mg via ORAL
  Filled 2019-07-23: qty 2

## 2019-07-23 MED ORDER — VANCOMYCIN HCL IN DEXTROSE 1-5 GM/200ML-% IV SOLN
1000.0000 mg | Freq: Once | INTRAVENOUS | Status: AC
  Administered 2019-07-23: 1000 mg via INTRAVENOUS
  Filled 2019-07-23: qty 200

## 2019-07-23 MED ORDER — RALOXIFENE HCL 60 MG PO TABS
60.0000 mg | ORAL_TABLET | Freq: Every day | ORAL | Status: DC
  Administered 2019-07-24 – 2019-07-27 (×4): 60 mg via ORAL
  Filled 2019-07-23 (×4): qty 1

## 2019-07-23 NOTE — Consult Note (Signed)
PHARMACY -  BRIEF ANTIBIOTIC NOTE   Pharmacy has received consult(s) for Vancomycin and Cefepime from an ED provider.  The patient's profile has been reviewed for ht/wt/allergies/indication/available labs.    One time order(s) placed for Cefepime 2g x1 and Vancomycin 1g x1 in the ED  Allergy- cefuroxime and amoxicillin. Patient has had cephalosporins since documented allergy.  Further antibiotics/pharmacy consults should be ordered by admitting physician if indicated.                       Thank you, Rowland Lathe 07/23/2019  5:03 PM

## 2019-07-23 NOTE — ED Provider Notes (Signed)
[facility] Emergency Department Provider Note  Time seen: 1:17 PM  I have reviewed the triage vital signs and the nursing notes.   HISTORY  Chief Complaint Flank Pain   HPI Julie Acosta is a 86 y.o. female with a past medical history of anxiety, breast cancer, diabetes, presents to the emergency department for left flank pain.  According to the patient for the past 2 days or so she has been experiencing pain in her left mid back that radiates around her left mid abdomen.  Patient states a history of a kidney infection previously that presented somewhat similarly.  Denies any dysuria or known hematuria.  Denies any known fever cough or shortness of breath.  Denies nausea or vomiting.   Past Medical History:  Diagnosis Date  . Anxiety   . Benign breast lumps   . Blood clotting disorder ([place])   . Bone cancer (HCC)    head of femur, left leg ... amputation at age 20  . Breast cancer of upper-outer quadrant of left female breast ([place]) 07/27/2017   11 mm invasive mammary carcinoma, ER 90%, PR 51-90%, negative margins.  Oncotype recurrence score: 1.  DCIS, margin less than 0.5 mm.  Negative sentinel node.  Accelerated partial breast radiation.  . Cervicalgia   . Chronic kidney disease    kidney stones  . Colon cancer [facility])    patient unaware of this  . Complication of anesthesia    mood alteration / not sure if d/t pain medicine or anesthesia as she passed out   . Cough    NASAL DRIP / SNEEZING / SORE THROAT MOSTLY CONSTANT  . Depression   . Diabetes ([place])   . Diverticulitis   . Dizzy   . GERD (gastroesophageal reflux disease)   . H/O blood clots    arm and leg  . HBP (high blood pressure)   . Hematuria    gross  . Hemorrhoids   . HLD (hyperlipidemia)   . Muscle pain   . Osteoarthritis   . Osteoporosis   . Panic disorder   . Personal history of radiation therapy   . PND (post-nasal drip)    CHRONIC WITH SORE THROAT AND SNEEZING  . Reflux   .  Skin cancer    nose  . Swelling   . Tremor   . Tremors of nervous system     Patient Active Problem List   Diagnosis Date Noted  . Coagulation disorder ([place]) 05/30/2019  . Ureteral stone with hydronephrosis 11/26/2018  . Pain due to onychomycosis of toenail of right foot 10/18/2018  . S/P bilateral below knee amputation ([place]) 10/18/2018  . Primary cancer of upper outer quadrant of left female breast ([place]) 06/12/2017  . Diarrhea 01/16/2017  . Hematuria 01/12/2017  . Chronic diarrhea 01/12/2017  . 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 Right leg swelling 10/13/2016  . Microscopic hematuria 06/02/2015  . Benign breast lumps 05/30/2015  . Cancer ([place]) 05/30/2015  . Depression 05/30/2015  . Diabetes mellitus type 2, uncomplicated ([place]) [id number]  . Hyperlipidemia, unspecified 05/30/2015  . Hypertension 05/30/2015  . Osteoporosis, post-menopausal 05/30/2015  . Panic attacks 05/30/2015    Past Surgical History:  Procedure Laterality Date  . APPENDECTOMY  1962  . BREAST BIOPSY Right 2006?   benign  . BREAST BIOPSY Left 2019   invasive mammary carcinoma  . BREAST CYST EXCISION Left 07/27/2017   Procedure: SKIN CYST EXCISED;  Surgeon: Robert Bellow, MD;  Location: [facility] ORS;  Service: General;  Laterality: Left;  . BREAST LUMPECTOMY Left 07/2017   invasive mammary, DCIS  mammosite  . BREAST SURGERY Right 2019  . CARPAL TUNNEL RELEASE Right   . CATARACT EXTRACTION W/PHACO Left 11/11/2016   Procedure: CATARACT EXTRACTION PHACO AND INTRAOCULAR LENS PLACEMENT (IOC);  Surgeon: Birder Robson, MD;  Location: [facility] ORS;  Service: Ophthalmology;  Laterality: Left;  [place] 01:07.9 AP% 19.2 CDE 13.02 Fluid pack lot # [id number]  . CATARACT EXTRACTION W/PHACO Right 12/09/2016   Procedure: CATARACT EXTRACTION PHACO AND INTRAOCULAR LENS PLACEMENT (IOC);  Surgeon: Birder Robson, MD;  Location: [facility] ORS;  Service: Ophthalmology;  Laterality: Right;  [place] 00:49 AP% 21.2 CDE 10.39 Fluid pack  lot # [id number]  . CHOLECYSTECTOMY    . COLONOSCOPY WITH PROPOFOL N/A 11/22/2014   Procedure: COLONOSCOPY WITH PROPOFOL;  Surgeon: Manya Silvas, MD;  Location: [facility] ENDOSCOPY;  Service: Endoscopy;  Laterality: N/A;  . CYSTOSCOPY WITH STENT PLACEMENT Bilateral 11/27/2018   Procedure: CYSTOSCOPY WITH STENT PLACEMENT;  Surgeon: Festus Aloe, MD;  Location: [facility] ORS;  Service: Urology;  Laterality: Bilateral;  . CYSTOSCOPY/URETEROSCOPY/HOLMIUM LASER/STENT PLACEMENT Bilateral 12/17/2018   Procedure: CYSTOSCOPY/URETEROSCOPY/HOLMIUM LASER/STENT Exchange;  Surgeon: Billey Co, MD;  Location: [facility] ORS;  Service: Urology;  Laterality: Bilateral;  . ESOPHAGOGASTRODUODENOSCOPY  11/22/2014   Procedure: ESOPHAGOGASTRODUODENOSCOPY (EGD);  Surgeon: Manya Silvas, MD;  Location: [facility] ENDOSCOPY;  Service: Endoscopy;;  . ESOPHAGOGASTRODUODENOSCOPY (EGD) WITH PROPOFOL N/A 03/05/2017   Procedure: ESOPHAGOGASTRODUODENOSCOPY (EGD) WITH PROPOFOL;  Surgeon: Lin Landsman, MD;  Location: [facility] ENDOSCOPY;  Service: Gastroenterology;  Laterality: N/A;  . EXTRACORPOREAL SHOCK WAVE LITHOTRIPSY Right 03/04/2018   Procedure: EXTRACORPOREAL SHOCK WAVE LITHOTRIPSY (ESWL);  Surgeon: Billey Co, MD;  Location: [facility] ORS;  Service: Urology;  Laterality: Right;  . EYE SURGERY Bilateral 2012   cataract extraction with iol  . GALLBLADDER SURGERY  1968  . hemi pelvectomy     left side at age 63  . HEMIPELVIC Left 1962  . LEG SURGERY Left    AMPUTATION d/t cancer at 86 years old  . MASTECTOMY, PARTIAL Left 07/27/2017   Procedure: MASTECTOMY PARTIAL;  Surgeon: Robert Bellow, MD;  Location: [facility] ORS;  Service: General;  Laterality: Left;  . MOUTH SURGERY  2019   teeth pulled with a bridge insertion.   fell out 12/16/18  . OVARY SURGERY Right    cyst removed  . SAVORY DILATION  11/22/2014   Procedure: SAVORY DILATION;  Surgeon: Manya Silvas, MD;  Location: [facility] ENDOSCOPY;  Service: Endoscopy;;  .  SENTINEL NODE BIOPSY Left 07/27/2017   Procedure: SENTINEL NODE BIOPSY;  Surgeon: Robert Bellow, MD;  Location: [facility] ORS;  Service: General;  Laterality: Left;  . SKIN CANCER EXCISION     nose and arm and face  . TONSILLECTOMY      Prior to Admission medications   Medication Sig Start Date End Date Taking? Authorizing Provider  CLOBETASOL PROPIONATE EX Apply 1 Dose topically 2 (two) times daily. Lt ear    [provider]  fenofibrate 160 MG tablet TAKE ONE TABLET BY MOUTH EVERY DAY 05/23/19   [provider]  GLIPIZIDE XL 2.5 MG 24 hr tablet Take 2.5 mg by mouth daily.  05/09/13   [provider]  letrozole (FEMARA) 2.5 MG tablet Take 1 tablet (2.5 mg total) by mouth daily. 07/09/18   Lloyd Huger, MD  losartan (COZAAR) 50 MG tablet TAKE 1 TABLET BY MOUTH DAILY 04/15/19   [provider]  metFORMIN (  GLUCOPHAGE) 500 MG tablet Take 1,000 mg by mouth 2 (two) times daily.  03/15/13   [provider]  metoprolol succinate (TOPROL-XL) 25 MG 24 hr tablet Take 12.5 mg by mouth at bedtime.  04/18/13   [provider]  omeprazole (PRILOSEC) 40 MG capsule Take 40 mg by mouth daily.  08/11/18   [provider]  PARoxetine (PAXIL) 40 MG tablet Take 40 mg by mouth daily.  02/28/13   [provider]  Polyethyl Glycol-Propyl Glycol 0.4-0.3 % SOLN Apply to eye 2 (two) times daily as needed.    [provider]  raloxifene (EVISTA) 60 MG tablet Take 60 mg by mouth daily.  05/02/13   [provider]  rivaroxaban (XARELTO) 20 MG TABS tablet Take 20 mg by mouth daily with supper.    [provider]    Allergies  Allergen Reactions  . Ciprofloxacin Hives  . Codeine Other (See Comments)    HALLUCINATIONS  . Tape     Rash and skin irritation/ paper tape and tegaderm OK  . Ciprocinonide [Fluocinolone] Other (See Comments)    unknown  . Amoxicillin Other (See Comments)    Upset stomach Has patient had a PCN  reaction causing immediate rash, facial/tongue/throat swelling, SOB or lightheadedness with hypotension: No Has patient had a PCN reaction causing severe rash involving mucus membranes or skin necrosis: No Has patient had a PCN reaction that required hospitalization: No Has patient had a PCN reaction occurring within the last 10 years: Yes If all of the above answers are "NO", then may proceed with Cephalosporin use.;  . Cefuroxime Other (See Comments)    OK INTRACAMERALLY PER DR WLP, upset stomach  . Lipitor [Atorvastatin] Other (See Comments)    unknown    Family History  Problem Relation Age of Onset  . Breast cancer Paternal Aunt   . Ovarian cancer Sister   . Prostate cancer Father   . Stroke Mother   . Kidney cancer Neg Hx   . Bladder Cancer Neg Hx     Social History Social History   Tobacco Use  . Smoking status: Never Smoker  . Smokeless tobacco: Never Used  Substance Use Topics  . Alcohol use: No  . Drug use: No    Review of Systems Constitutional: Negative for fever. Cardiovascular: Negative for chest pain. Respiratory: Negative for shortness of breath. Gastrointestinal: Left flank pain. Genitourinary: Negative for urinary compaints Musculoskeletal: Negative for musculoskeletal complaints Neurological: Negative for headache All other ROS negative  ____________________________________________   PHYSICAL EXAM:  VITAL SIGNS: ED Triage Vitals  Enc Vitals Group     BP --      Pulse Rate 07/23/19 1316 (!) 120     Resp 07/23/19 1316 18     Temp 07/23/19 1316 99.4 F (37.4 C)     Temp Source 07/23/19 1316 Oral     SpO2 07/23/19 1316 97 %     Weight --      Height --      Head Circumference --      Peak Flow --      Pain Score 07/23/19 1317 2     Pain Loc --      Pain Edu? --      Excl. in [place]? --    Constitutional: Alert and oriented. Well appearing and in no distress. Eyes: Normal exam ENT      Head: Normocephalic and atraumatic.       Mouth/Throat: Mucous membranes are moist. Cardiovascular:  Normal rate, regular rhythm. Respiratory: Normal respiratory effort without tachypnea nor retractions. Breath sounds are clear Gastrointestinal: Mild left CVA tenderness mild left mid abdominal tenderness.  No rebound guarding or distention. Musculoskeletal: Status post left lower extremity amputation at the pelvis. Neurologic:  Normal speech and language. No gross focal neurologic deficits Skin:  Skin is warm, dry and intact.  Psychiatric: Mood and affect are normal.  ____________________________________________   EKG viewed and interpreted by myself shows sinus tachycardia 119 bpm with a borderline widened QRS, left axis, largely normal intervals nonspecific ST changes.  RADIOLOGY  CT concerning for splenic infarct versus metastases.  ____________________________________________   INITIAL IMPRESSION / ASSESSMENT AND PLAN / ED COURSE  Pertinent labs & imaging results that were available during my care of the patient were reviewed by me and considered in my medical decision making (see chart for details).   Patient presents to the emergency department for left flank pain over the past 2 days.  Patient does have a low-grade borderline 99.4 temperature.  Patient does have mild left-sided abdominal and flank tenderness.  We will check labs, urinalysis, urine culture we will obtain a CT scan of the abdomen/pelvis to further evaluate.  Patient agreeable to plan of care.  Patient's white blood cell count is 17,000, urinalysis is pending.  CT is concerning for splenic infarct versus metastases.  We will repeat CTs of the chest and abdomen with contrast to further evaluate.  Patient care signed out to Dr. Quentin Cornwall CTs and urine pending.  Patient receiving IV fluids.  Julie Acosta was evaluated in Emergency Department on 07/23/2019 for the symptoms described in the history of present illness. She was evaluated in the context of the global  COVID-19 pandemic, which necessitated consideration that the patient might be at risk for infection with the SARS-CoV-2 virus that causes COVID-19. Institutional protocols and algorithms that pertain to the evaluation of patients at risk for COVID-19 are in a state of rapid change based on information released by regulatory bodies including the CDC and federal and state organizations. These policies and algorithms were followed during the patient's care in the ED.  ____________________________________________   FINAL CLINICAL IMPRESSION(S) / ED DIAGNOSES  Left flank pain   Harvest Dark, MD 07/23/19 1528

## 2019-07-23 NOTE — ED Triage Notes (Addendum)
Pt arrives via EMS from home ([facility]) after having L flank pain since Wed that radiates to abdomen and back- pt has a hx of kidney infection and states that this feels similar- pt has active L sided breast cancer and has not started chemo or radiation

## 2019-07-23 NOTE — Progress Notes (Signed)
History and Physical    Julie Acosta [id number] DOB: 09/02/1933 DOA: 07/23/2019  PCP: Maryland Pink, MD  Patient coming from: [facility]   Chief Complaint: Left flank pain  HPI: Julie Acosta is a 86 y.o. female with medical history significant of  anxiety, breast cancer, diabetes, osteopenia, left leg sarcoma, status post left hemipelvectomy and amputation with no evidence of recurrence presents to the emergency department for left flank pain.  She reports seeing her oncologist Dr. Grayland Ormond on Tuesday and was doing well.  The following day started having left flank pain.  Denies any nausea or vomiting, denies fever or chills.  Feels cold. Patient is on Xarelto and reports being compliant.  She has been on it since 2004 but she is unsure of why.  I spoke to the daughter who believes she had multiple blood clots and one in the leg.  And family history of DVT.  ED Course: In the ER patient was found with temperature of 102, WBC of 17.3,  Review of Systems: All systems reviewed and otherwise negative.  Sinus tachycardic.  Given 2 L of fluid so far.  Lactic acid at 2.4. CT of the chest revealed multiple wedge-shaped hypoattenuating defects in the spleen findings are consistent with splenic infarcts in the absence of trauma.  2.8 cm right-sided thyroid nodule which is essentially stable since 2015.  Scattered bilateral pulmonary nodules all of which are stable since 2015.  Bilateral nonobstructive nephrolithiasis, mild cardiomegaly, rectosigmoid diverticulosis.was started on vanco and cefepime.    Past Medical History:  Diagnosis Date  . Anxiety   . Benign breast lumps   . Blood clotting disorder ([place])   . Bone cancer (HCC)    head of femur, left leg ... amputation at age 26  . Breast cancer of upper-outer quadrant of left female breast ([place]) 07/27/2017   11 mm invasive mammary carcinoma, ER 90%, PR 51-90%, negative margins.  Oncotype recurrence score: 1.  DCIS, margin less than 0.5  mm.  Negative sentinel node.  Accelerated partial breast radiation.  . Cervicalgia   . Chronic kidney disease    kidney stones  . Colon cancer [facility])    patient unaware of this  . Complication of anesthesia    mood alteration / not sure if d/t pain medicine or anesthesia as she passed out   . Cough    NASAL DRIP / SNEEZING / SORE THROAT MOSTLY CONSTANT  . Depression   . Diabetes ([place])   . Diverticulitis   . Dizzy   . GERD (gastroesophageal reflux disease)   . H/O blood clots    arm and leg  . HBP (high blood pressure)   . Hematuria    gross  . Hemorrhoids   . HLD (hyperlipidemia)   . Muscle pain   . Osteoarthritis   . Osteoporosis   . Panic disorder   . Personal history of radiation therapy   . PND (post-nasal drip)    CHRONIC WITH SORE THROAT AND SNEEZING  . Reflux   . Skin cancer    nose  . Swelling   . Tremor   . Tremors of nervous system     Past Surgical History:  Procedure Laterality Date  . APPENDECTOMY  1962  . BREAST BIOPSY Right 2006?   benign  . BREAST BIOPSY Left 2019   invasive mammary carcinoma  . BREAST CYST EXCISION Left 07/27/2017   Procedure: SKIN CYST EXCISED;  Surgeon: Robert Bellow, MD;  Location: [facility] ORS;  Service: General;  Laterality: Left;  . BREAST LUMPECTOMY Left 07/2017   invasive mammary, DCIS  mammosite  . BREAST SURGERY Right 2019  . CARPAL TUNNEL RELEASE Right   . CATARACT EXTRACTION W/PHACO Left 11/11/2016   Procedure: CATARACT EXTRACTION PHACO AND INTRAOCULAR LENS PLACEMENT (IOC);  Surgeon: Birder Robson, MD;  Location: [facility] ORS;  Service: Ophthalmology;  Laterality: Left;  [place] 01:07.9 AP% 19.2 CDE 13.02 Fluid pack lot # [id number]  . CATARACT EXTRACTION W/PHACO Right 12/09/2016   Procedure: CATARACT EXTRACTION PHACO AND INTRAOCULAR LENS PLACEMENT (IOC);  Surgeon: Birder Robson, MD;  Location: [facility] ORS;  Service: Ophthalmology;  Laterality: Right;  [place] 00:49 AP% 21.2 CDE 10.39 Fluid pack lot # [id number]  .  CHOLECYSTECTOMY    . COLONOSCOPY WITH PROPOFOL N/A 11/22/2014   Procedure: COLONOSCOPY WITH PROPOFOL;  Surgeon: Manya Silvas, MD;  Location: [facility] ENDOSCOPY;  Service: Endoscopy;  Laterality: N/A;  . CYSTOSCOPY WITH STENT PLACEMENT Bilateral 11/27/2018   Procedure: CYSTOSCOPY WITH STENT PLACEMENT;  Surgeon: Festus Aloe, MD;  Location: [facility] ORS;  Service: Urology;  Laterality: Bilateral;  . CYSTOSCOPY/URETEROSCOPY/HOLMIUM LASER/STENT PLACEMENT Bilateral 12/17/2018   Procedure: CYSTOSCOPY/URETEROSCOPY/HOLMIUM LASER/STENT Exchange;  Surgeon: Billey Co, MD;  Location: [facility] ORS;  Service: Urology;  Laterality: Bilateral;  . ESOPHAGOGASTRODUODENOSCOPY  11/22/2014   Procedure: ESOPHAGOGASTRODUODENOSCOPY (EGD);  Surgeon: Manya Silvas, MD;  Location: [facility] ENDOSCOPY;  Service: Endoscopy;;  . ESOPHAGOGASTRODUODENOSCOPY (EGD) WITH PROPOFOL N/A 03/05/2017   Procedure: ESOPHAGOGASTRODUODENOSCOPY (EGD) WITH PROPOFOL;  Surgeon: Lin Landsman, MD;  Location: [facility] ENDOSCOPY;  Service: Gastroenterology;  Laterality: N/A;  . EXTRACORPOREAL SHOCK WAVE LITHOTRIPSY Right 03/04/2018   Procedure: EXTRACORPOREAL SHOCK WAVE LITHOTRIPSY (ESWL);  Surgeon: Billey Co, MD;  Location: [facility] ORS;  Service: Urology;  Laterality: Right;  . EYE SURGERY Bilateral 2012   cataract extraction with iol  . GALLBLADDER SURGERY  1968  . hemi pelvectomy     left side at age 15  . HEMIPELVIC Left 1962  . LEG SURGERY Left    AMPUTATION d/t cancer at 86 years old  . MASTECTOMY, PARTIAL Left 07/27/2017   Procedure: MASTECTOMY PARTIAL;  Surgeon: Robert Bellow, MD;  Location: [facility] ORS;  Service: General;  Laterality: Left;  . MOUTH SURGERY  2019   teeth pulled with a bridge insertion.   fell out 12/16/18  . OVARY SURGERY Right    cyst removed  . SAVORY DILATION  11/22/2014   Procedure: SAVORY DILATION;  Surgeon: Manya Silvas, MD;  Location: [facility] ENDOSCOPY;  Service: Endoscopy;;  . SENTINEL NODE BIOPSY Left  07/27/2017   Procedure: SENTINEL NODE BIOPSY;  Surgeon: Robert Bellow, MD;  Location: [facility] ORS;  Service: General;  Laterality: Left;  . SKIN CANCER EXCISION     nose and arm and face  . TONSILLECTOMY       reports that she has never smoked. She has never used smokeless tobacco. She reports that she does not drink alcohol or use drugs.  Allergies  Allergen Reactions  . Ciprofloxacin Hives  . Codeine Other (See Comments)    HALLUCINATIONS  . Tape     Rash and skin irritation/ paper tape and tegaderm OK  . Ciprocinonide [Fluocinolone] Other (See Comments)    unknown  . Amoxicillin Other (See Comments)    Upset stomach Has patient had a PCN reaction causing immediate rash, facial/tongue/throat swelling, SOB or lightheadedness with hypotension: No Has patient had a PCN reaction causing severe rash involving mucus membranes or skin necrosis: No Has patient  had a PCN reaction that required hospitalization: No Has patient had a PCN reaction occurring within the last 10 years: Yes If all of the above answers are "NO", then may proceed with Cephalosporin use.;  . Cefuroxime Other (See Comments)    OK INTRACAMERALLY PER DR WLP, upset stomach  . Lipitor [Atorvastatin] Other (See Comments)    unknown    Family History  Problem Relation Age of Onset  . Breast cancer Paternal Aunt   . Ovarian cancer Sister   . Prostate cancer Father   . Stroke Mother   . Kidney cancer Neg Hx   . Bladder Cancer Neg Hx      Prior to Admission medications   Medication Sig Start Date End Date Taking? Authorizing Provider  letrozole (FEMARA) 2.5 MG tablet Take 1 tablet (2.5 mg total) by mouth daily. 07/09/18  Yes Lloyd Huger, MD  metoprolol succinate (TOPROL-XL) 25 MG 24 hr tablet Take 12.5 mg by mouth at bedtime.  04/18/13  Yes [provider]  omeprazole (PRILOSEC) 40 MG capsule Take 40 mg by mouth daily.  08/11/18  Yes [provider]  PARoxetine (PAXIL) 40 MG tablet Take 40  mg by mouth daily.  02/28/13  Yes [provider]  raloxifene (EVISTA) 60 MG tablet Take 60 mg by mouth daily.  05/02/13  Yes [provider]  fenofibrate 160 MG tablet TAKE ONE TABLET BY MOUTH EVERY DAY 05/23/19   [provider]  GLIPIZIDE XL 2.5 MG 24 hr tablet Take 2.5 mg by mouth daily.  05/09/13   [provider]  losartan (COZAAR) 50 MG tablet TAKE 1 TABLET BY MOUTH DAILY 04/15/19   [provider]  metFORMIN (GLUCOPHAGE) 500 MG tablet Take 1,000 mg by mouth 2 (two) times daily.  03/15/13   [provider]  Polyethyl Glycol-Propyl Glycol 0.4-0.3 % SOLN Apply to eye 2 (two) times daily as needed.    [provider]  rivaroxaban (XARELTO) 20 MG TABS tablet Take 20 mg by mouth daily with supper.    [provider]    Physical Exam: Vitals:   07/23/19 1430 07/23/19 1431 07/23/19 1500 07/23/19 1646  BP: 139/62  138/63   Pulse: (!) 120 (!) 123 (!) 122   Resp: (!) 29 (!) 28 (!) 23   Temp:    (!) 102 F (38.9 C)  TempSrc:    Rectal  SpO2: 91% 96% 97%     Constitutional: NAD, calm, comfortable Vitals:   07/23/19 1430 07/23/19 1431 07/23/19 1500 07/23/19 1646  BP: 139/62  138/63   Pulse: (!) 120 (!) 123 (!) 122   Resp: (!) 29 (!) 28 (!) 23   Temp:    (!) 102 F (38.9 C)  TempSrc:    Rectal  SpO2: 91% 96% 97%    Eyes: PERRL, lids and conjunctivae normal ENMT: Mucous membranes are moist. Posterior pharynx clear of any exudate or lesions.Normal dentition.  Neck: normal, supple, no masses, no thyromegaly Respiratory: clear to auscultation bilaterally, no wheezing, no crackles. Normal respiratory effort. No accessory muscle use.  Cardiovascular: Regular rate and rhythm, no murmurs / rubs / gallops. No extremity edema. 2+ pedal pulses. No carotid bruits.  Abdomen: no tenderness, no masses palpated. No hepatosplenomegaly. Bowel sounds positive.  Musculoskeletal: no clubbing / cyanosis. No joint deformity upper and lower  extremities. Good ROM, no contractures. Normal muscle tone.  Skin: no rashes, lesions, ulcers. No induration Neurologic: CN 2-12 grossly intact. Sensation intact, DTR normal. Strength 5/5  in all 4.  Psychiatric: Normal judgment and insight. Alert and oriented x 3. Normal mood.    Labs on Admission: I have personally reviewed following labs and imaging studies  CBC: Recent Labs  Lab 07/19/19 1027 07/23/19 1317  WBC 7.5 17.3*  NEUTROABS 5.1  --   HGB 9.4* 9.5*  HCT 33.4* 34.5*  MCV 74.9* 75.5*  PLT 371 [id number]   Basic Metabolic Panel: Recent Labs  Lab 07/19/19 1027 07/23/19 1317  NA 137 136  K 4.7 4.6  CL 108 105  CO2 20* 23  GLUCOSE 213* 241*  BUN 23 14  CREATININE 0.78 0.64  CALCIUM 10.1 10.7*   GFR: Estimated Creatinine Clearance: 47.5 mL/min (by C-G formula based on SCr of 0.64 mg/dL). Liver Function Tests: Recent Labs  Lab 07/23/19 1317  AST 32  ALT 13  ALKPHOS 50  BILITOT 0.6  PROT 7.3  ALBUMIN 4.0   Recent Labs  Lab 07/23/19 1317  LIPASE 40   No results for input(s): AMMONIA in the last 168 hours. Coagulation Profile: No results for input(s): INR, PROTIME in the last 168 hours. Cardiac Enzymes: No results for input(s): CKTOTAL, CKMB, CKMBINDEX, TROPONINI in the last 168 hours. BNP (last 3 results) No results for input(s): PROBNP in the last 8760 hours. HbA1C: No results for input(s): HGBA1C in the last 72 hours. CBG: No results for input(s): GLUCAP in the last 168 hours. Lipid Profile: No results for input(s): CHOL, HDL, LDLCALC, TRIG, CHOLHDL, LDLDIRECT in the last 72 hours. Thyroid Function Tests: No results for input(s): TSH, T4TOTAL, FREET4, T3FREE, THYROIDAB in the last 72 hours. Anemia Panel: No results for input(s): VITAMINB12, FOLATE, FERRITIN, TIBC, IRON, RETICCTPCT in the last 72 hours. Urine analysis:    Component Value Date/Time   COLORURINE YELLOW (A) 07/23/2019 1317   APPEARANCEUR CLEAR (A) 07/23/2019 1317   APPEARANCEUR Cloudy  (A) 11/26/2018 1157   LABSPEC >1.046 (H) 07/23/2019 1317   PHURINE 5.0 07/23/2019 1317   GLUCOSEU >=500 (A) 07/23/2019 1317   HGBUR NEGATIVE 07/23/2019 1317   BILIRUBINUR NEGATIVE 07/23/2019 1317   BILIRUBINUR Negative 11/26/2018 1157   KETONESUR NEGATIVE 07/23/2019 1317   PROTEINUR NEGATIVE 07/23/2019 1317   NITRITE NEGATIVE 07/23/2019 1317   LEUKOCYTESUR NEGATIVE 07/23/2019 1317    Radiological Exams on Admission: CT Chest W Contrast  Result Date: 07/23/2019 CLINICAL DATA:  Follow-up recent splenic infarcts EXAM: CT CHEST, ABDOMEN, AND PELVIS WITH CONTRAST TECHNIQUE: Multidetector CT imaging of the chest, abdomen and pelvis was performed following the standard protocol during bolus administration of intravenous contrast. CONTRAST:  137mL OMNIPAQUE IOHEXOL 300 MG/ML  SOLN COMPARISON:  CT from the same day. FINDINGS: CT CHEST FINDINGS Cardiovascular: The heart size is mildly enlarged. No significant pericardial effusion. Atherosclerotic changes are noted of the thoracic aorta without evidence for an aneurysm or dissection. The main pulmonary artery is mildly dilated. There is no large centrally located pulmonary embolism. Mediastinum/Nodes: --No mediastinal or hilar lymphadenopathy. --No axillary lymphadenopathy. --No supraclavicular lymphadenopathy. --Normal thyroid gland. --there is a right-sided thyroid nodule measuring approximately 2.8 cm. This appears relatively stable since 2015. Lungs/Pleura: There is a pulmonary nodule in the right upper lobe measuring approximately 5 mm (axial series 4, image 39). This is essentially stable since 2015. There are multiple small subcentimeter pulmonary nodules in the right middle lobe and right lower lobe that are essentially stable since prior study. There is a stable 1 cm pulmonary nodule in the left lower lobe there is some atelectasis at the lung bases.  There is no significant pleural effusion. There is no pneumothorax. Musculoskeletal: No chest wall  abnormality. No acute or significant osseous findings. CT ABDOMEN PELVIS FINDINGS Hepatobiliary: There is decreased hepatic attenuation suggestive of hepatic steatosis. Status post cholecystectomy.There is no biliary ductal dilation. Pancreas: Normal contours without ductal dilatation. No peripancreatic fluid collection. Spleen: There are multiple wedge-shaped hypoattenuating defects in the spleen, likely involving approximately 40-50% of the splenic parenchyma. There is no perisplenic hematoma. No evidence for active extravasation. Adrenals/Urinary Tract: --Adrenal glands: No adrenal hemorrhage. --Right kidney/ureter: Multiple nonobstructing stones are noted involving the right kidney. --Left kidney/ureter: There are nonobstructing stones involving the left kidney. --Urinary bladder: Unremarkable. Stomach/Bowel: --Stomach/Duodenum: No hiatal hernia or other gastric abnormality. Normal duodenal course and caliber. --Small bowel: No dilatation or inflammation. --Colon: Rectosigmoid diverticulosis without acute inflammation. --Appendix: Not visualized. No right lower quadrant inflammation or free fluid. Vascular/Lymphatic: Atherosclerotic calcification is present within the non-aneurysmal abdominal aorta, without hemodynamically significant stenosis. The splenic artery is grossly patent. --No retroperitoneal lymphadenopathy. --No mesenteric lymphadenopathy. --No pelvic or inguinal lymphadenopathy. Reproductive: There is a 2.1 cm cystic structure involving the left ovary. This is relatively stable since 2020 and is consistent with a benign process. Multiple calcified fibroids are noted. Other: No ascites or free air. The abdominal wall is normal. Musculoskeletal. 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 IMPRESSION: 1. Multiple wedge-shaped hypoattenuating defects in the spleen, likely involving approximately 40-50% of the splenic parenchyma. Findings are most consistent with splenic  infarcts in the absence of trauma. The splenic artery is grossly patent. 2. There is scattered bilateral pulmonary nodules, all of which appear relatively stable since 2015. No further follow-up is required. 3. Mild cardiomegaly. 4. Bilateral nonobstructing nephrolithiasis. 5. Rectosigmoid diverticulosis. 6. There is a 2.8 cm right-sided thyroid nodule which is essentially stable since 2015. No follow-up recommended unless clinically warranted (ref: J Am Coll Radiol. 2015 Feb;12(2): 143-50). 7. Aortic Atherosclerosis (ICD10-I70.0). Electronically Signed   By: Constance Holster M.D.   On: 07/23/2019 15:39   CT ABDOMEN PELVIS W CONTRAST  Result Date: 07/23/2019 CLINICAL DATA:  Follow-up recent splenic infarcts EXAM: CT CHEST, ABDOMEN, AND PELVIS WITH CONTRAST TECHNIQUE: Multidetector CT imaging of the chest, abdomen and pelvis was performed following the standard protocol during bolus administration of intravenous contrast. CONTRAST:  170mL OMNIPAQUE IOHEXOL 300 MG/ML  SOLN COMPARISON:  CT from the same day. FINDINGS: CT CHEST FINDINGS Cardiovascular: The heart size is mildly enlarged. No significant pericardial effusion. Atherosclerotic changes are noted of the thoracic aorta without evidence for an aneurysm or dissection. The main pulmonary artery is mildly dilated. There is no large centrally located pulmonary embolism. Mediastinum/Nodes: --No mediastinal or hilar lymphadenopathy. --No axillary lymphadenopathy. --No supraclavicular lymphadenopathy. --Normal thyroid gland. --there is a right-sided thyroid nodule measuring approximately 2.8 cm. This appears relatively stable since 2015. Lungs/Pleura: There is a pulmonary nodule in the right upper lobe measuring approximately 5 mm (axial series 4, image 39). This is essentially stable since 2015. There are multiple small subcentimeter pulmonary nodules in the right middle lobe and right lower lobe that are essentially stable since prior study. There is a stable 1  cm pulmonary nodule in the left lower lobe there is some atelectasis at the lung bases. There is no significant pleural effusion. There is no pneumothorax. Musculoskeletal: No chest wall abnormality. No acute or significant osseous findings. CT ABDOMEN PELVIS FINDINGS Hepatobiliary: There is decreased hepatic attenuation suggestive of hepatic steatosis. Status post cholecystectomy.There is no biliary  ductal dilation. Pancreas: Normal contours without ductal dilatation. No peripancreatic fluid collection. Spleen: There are multiple wedge-shaped hypoattenuating defects in the spleen, likely involving approximately 40-50% of the splenic parenchyma. There is no perisplenic hematoma. No evidence for active extravasation. Adrenals/Urinary Tract: --Adrenal glands: No adrenal hemorrhage. --Right kidney/ureter: Multiple nonobstructing stones are noted involving the right kidney. --Left kidney/ureter: There are nonobstructing stones involving the left kidney. --Urinary bladder: Unremarkable. Stomach/Bowel: --Stomach/Duodenum: No hiatal hernia or other gastric abnormality. Normal duodenal course and caliber. --Small bowel: No dilatation or inflammation. --Colon: Rectosigmoid diverticulosis without acute inflammation. --Appendix: Not visualized. No right lower quadrant inflammation or free fluid. Vascular/Lymphatic: Atherosclerotic calcification is present within the non-aneurysmal abdominal aorta, without hemodynamically significant stenosis. The splenic artery is grossly patent. --No retroperitoneal lymphadenopathy. --No mesenteric lymphadenopathy. --No pelvic or inguinal lymphadenopathy. Reproductive: There is a 2.1 cm cystic structure involving the left ovary. This is relatively stable since 2020 and is consistent with a benign process. Multiple calcified fibroids are noted. Other: No ascites or free air. The abdominal wall is normal. Musculoskeletal. 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  IMPRESSION: 1. Multiple wedge-shaped hypoattenuating defects in the spleen, likely involving approximately 40-50% of the splenic parenchyma. 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 The splenic artery is grossly patent. 2. There is scattered bilateral pulmonary nodules, all of which appear relatively stable since 2015. No further follow-up is required. 3. Mild cardiomegaly. 4. Bilateral nonobstructing nephrolithiasis. 5. Rectosigmoid diverticulosis. 6. There is a 2.8 cm right-sided thyroid nodule which is essentially stable since 2015. No follow-up recommended unless clinically warranted (ref: J Am Coll Radiol. 2015 Feb;12(2): 143-50). 7. Aortic Atherosclerosis (ICD10-I70.0). Electronically Signed   By: Constance Holster M.D.   On: 07/23/2019 15:39   CT Renal Stone Study  Result Date: 07/23/2019 CLINICAL DATA:  Pt arrives via EMS from home ([facility]) after having L flank pain since Wed that radiates to abdomen and back- pt has a hx of kidney infection and states that this feels similar EXAM: CT ABDOMEN AND PELVIS WITHOUT CONTRAST TECHNIQUE: Multidetector CT imaging of the abdomen and pelvis was performed following the standard protocol without IV contrast. COMPARISON:  CT abdomen pelvis 11/25/2018 FINDINGS: The most lateral aspect of the left lateral abdominal wall is excluded from field of view. Lower chest: Bibasilar atelectasis. Solid and ground-glass nodules in the right lung base appear stable. There is a 1.2 cm nodule in the left lung base which appears mildly increased in size compared to the prior study. Hepatobiliary: No focal liver abnormality is seen. No gallstones, gallbladder wall thickening, or biliary dilatation. Pancreas: Unremarkable. Spleen: New multiple areas of confluent hypoattenuation in the spleen. Spleen is normal in size. Adrenals/Urinary Tract: Adrenal glands are unremarkable. There are punctate calculi in the bilateral kidneys.  No hydronephrosis. Stomach/Bowel: Stomach is within normal limits. No evidence of bowel wall thickening, distention, or inflammatory changes. Multiple colonic diverticula without evidence of diverticulitis. Vascular/Lymphatic: Moderate aortoiliac atherosclerotic calcification without aneurysm. Vascular patency cannot be assessed in the absence of IV contrast. No lymphadenopathy. Reproductive: Fibroid uterus. Bilateral adnexa unremarkable. Other: No abdominal wall hernia or abnormality. No abdominopelvic ascites. Musculoskeletal: Left hemipelvectomy. Degenerative changes in the right sacroiliac joint. IMPRESSION: 1. New multiple areas of confluent hypoattenuation in the spleen are nonspecific but differential includes splenic infarcts and metastatic disease. Contrast-enhanced evaluation is recommended. 2. Bilateral punctate nephrolithiasis without hydronephrosis. 3. Colonic diverticulosis without evidence of diverticulitis. 4. There is a  1.2 cm nodule in the left lung base which appears mildly increased in size compared to the prior study. Nonemergent chest CT is recommended for further evaluation. Aortic Atherosclerosis (ICD10-I70.0). Electronically Signed   By: Audie Pinto M.D.   On: 07/23/2019 14:11    EKG: Independently reviewed.  Sinus tachycardia  Assessment/Plan Active Problems:   Splenic infarct   1,sepsis without shock- etiology unclear Patient with tachycardia, fever, leukocytosis CT scan with no infection but however found splenic multiple infarcts Will empirically treat her with IV antibiotics, started with Vanco and cefepime in the ER we will continue Check urine culture urinalysis, blood cultures   2.  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 Clayborn Bigness for TEE that would likely be done on  Monday We will consult oncology   3.  Diabetes mellitus type 2-we will hold p.o. medications Placed on R ISS, check fingersticks Check A1c  4.  History of breast cancer-continue medications  DVT prophylaxis: heparin gtt Code Status: DNR  Family Communication: called daughter no answer, vm came on Disposition Plan: back home Consults called: cardiology  Admission status: Inpatient as patient requires more than 2 midnight stays   Nolberto Hanlon MD [facility] Pager [phone]   If 7PM-7AM, please contact night-coverage [email or web address] Password [facility]  07/23/2019, 5:54 PM

## 2019-07-23 NOTE — ED Notes (Signed)
Attempted to get a second IV with no success

## 2019-07-23 NOTE — ED Notes (Signed)
Pt transported to CT ?

## 2019-07-23 NOTE — Consult Note (Signed)
[place] for Heparin  Indication: DVT  Allergies  Allergen Reactions  . Ciprofloxacin Hives  . Codeine Other (See Comments)    HALLUCINATIONS  . Tape     Rash and skin irritation/ paper tape and tegaderm OK  . Ciprocinonide [Fluocinolone] Other (See Comments)    unknown  . Amoxicillin Other (See Comments)    Upset stomach Has patient had a PCN reaction causing immediate rash, facial/tongue/throat swelling, SOB or lightheadedness with hypotension: No Has patient had a PCN reaction causing severe rash involving mucus membranes or skin necrosis: No Has patient had a PCN reaction that required hospitalization: No Has patient had a PCN reaction occurring within the last 10 years: Yes If all of the above answers are "NO", then may proceed with Cephalosporin use.;  . Cefuroxime Other (See Comments)    OK INTRACAMERALLY PER DR WLP, upset stomach  . Lipitor [Atorvastatin] Other (See Comments)    unknown    Patient Measurements:   Heparin Dosing Weight: 73 kg  Vital Signs: Temp: 102 F (38.9 C) (03/20 1646) Temp Source: Rectal (03/20 1646) BP: 138/63 (03/20 1500) Pulse Rate: 122 (03/20 1500)  Labs: Recent Labs    07/23/19 1317  HGB 9.5*  HCT 34.5*  PLT 277  CREATININE 0.64    Estimated Creatinine Clearance: 47.5 mL/min (by C-G formula based on SCr of 0.64 mg/dL).   Medications:  Xarelto 20 mg - last dose 07/23/19 @ 10 AM   Assessment: Pharmacy has been consulted for heparin dosing in a patient taking Xarelto for suspected DVT. Per chart review, she has been on Xarelto since 2004 but she is unsure of why.  Dr. Kurtis Bushman spoke to the patient's daughter who believes her mother had multiple blood clots and one in the leg as well as a family history of DVT. At this time, Xarelto is being held and heparin started d/t Splenic multiple infarcts.   Will need to order baseline labs for aPTT and HL. CBC appropriate to start heparin. Will base  levels on aPTT for now until HL and aPTT correlate.  MD requests heparin infusion to be started at the last time Xarelto was taken.   Goal of Therapy:  Heparin level 0.3-0.7 units/ml aPTT 66-102 seconds Monitor platelets by anticoagulation protocol: Yes   Plan:  Baseline labs have been ordered  Heparin DW: 73 kg Order 3650 units bolus x 1 for tomorrow at 1000 Start heparin infusion at 1150 units/hr for tomorrow at 1000 Check anti-Xa level in 8 hours and daily while on heparin, per protocol Continue to monitor H&H and platelets   [place] Haydon R Adonijah Baena 07/23/2019,5:58 PM

## 2019-07-23 NOTE — ED Notes (Signed)
Pt placed on oxygen at 2lpm for pox of 88% with rebound pox to 95%.

## 2019-07-23 NOTE — ED Notes (Signed)
Purewick placed by this tech and Alissa, EDT. Brief placed and pt comfortable at this time.

## 2019-07-23 NOTE — ED Notes (Signed)
Report given to Rebekah, RN 

## 2019-07-23 NOTE — Consult Note (Signed)
Pharmacy Antibiotic Note  Julie Acosta is a 86 y.o. female admitted on 07/23/2019 with sepsis.  Pharmacy has been consulted for Vancomycin dosing. Patient had Vancomycin 1g in the ED ordered for today- she has not received   Plan: 1. Will order vancomycin 750 mg x1 dose for a total of 1750 mg loading dose. Will order Vancomycin maintenance dose 1000 mg Q36 hours. AUC goal 400-550. Expected AUC 505. Expected Cssmin 10.6.  Scr used: 0.8 mg/dL (actual Scr 0.64 mg/dL)  VD used 0.5 L/kg     Temp (24hrs), Avg:100.7 F (38.2 C), Min:99.4 F (37.4 C), Max:102 F (38.9 C)  Recent Labs  Lab 07/19/19 1027 07/23/19 1317 07/23/19 1610  WBC 7.5 17.3*  --   CREATININE 0.78 0.64  --   LATICACIDVEN  --   --  2.4*    Estimated Creatinine Clearance: 47.5 mL/min (by C-G formula based on SCr of 0.64 mg/dL).    Allergies  Allergen Reactions  . Ciprofloxacin Hives  . Codeine Other (See Comments)    HALLUCINATIONS  . Tape     Rash and skin irritation/ paper tape and tegaderm OK  . Ciprocinonide [Fluocinolone] Other (See Comments)    unknown  . Amoxicillin Other (See Comments)    Upset stomach Has patient had a PCN reaction causing immediate rash, facial/tongue/throat swelling, SOB or lightheadedness with hypotension: No Has patient had a PCN reaction causing severe rash involving mucus membranes or skin necrosis: No Has patient had a PCN reaction that required hospitalization: No Has patient had a PCN reaction occurring within the last 10 years: Yes If all of the above answers are "NO", then may proceed with Cephalosporin use.;  . Cefuroxime Other (See Comments)    OK INTRACAMERALLY PER DR WLP, upset stomach  . Lipitor [Atorvastatin] Other (See Comments)    unknown    Antimicrobials this admission:   Dose adjustments this admission:   Microbiology results:   Thank you for allowing pharmacy to be a part of this patient's care.  Rowland Lathe 07/23/2019 5:50 PM

## 2019-07-23 NOTE — ED Provider Notes (Signed)
Patient received in signout from Dr. Posey Pronto ski pending CT imaging and reassessment.  Patient with persistent tachycardia noted to have low-grade temperature but with core temperature check was febrile to 100.2.  Given her white count tachycardia and fever concerning for sepsis.  Will give IV fluids.  CT imaging does not show any evidence of infectious source but does show evidence of multiple splenic infarcts but appears to have relatively preserved splenic blood flow.  She is complaining of pain on that side.  Will start broad-spectrum antibiotics if she coming from her nursing facility previous hospitalizations.  Will discuss with hospitalist for admission.   Merlyn Lot, MD 07/23/19 [phone]

## 2019-07-24 ENCOUNTER — Inpatient Hospital Stay
Admit: 2019-07-24 | Discharge: 2019-07-24 | Disposition: A | Discharge status: Home / Self Care | Payer: Medicare Other | Attending: Internal Medicine | Admitting: Internal Medicine

## 2019-07-24 DIAGNOSIS — Z853 Personal history of malignant neoplasm of breast: Secondary | ICD-10-CM

## 2019-07-24 DIAGNOSIS — A419 Sepsis, unspecified organism: Secondary | ICD-10-CM

## 2019-07-24 LAB — COMPREHENSIVE METABOLIC PANEL
ALT: 9 U/L (ref 0–44)
AST: 17 U/L (ref 15–41)
Albumin: 2.8 g/dL — ABNORMAL LOW (ref 3.5–5.0)
Alkaline Phosphatase: 35 U/L — ABNORMAL LOW (ref 38–126)
Anion gap: 7 (ref 5–15)
BUN: 9 mg/dL (ref 8–23)
CO2: 22 mmol/L (ref 22–32)
Calcium: 8.9 mg/dL (ref 8.9–10.3)
Chloride: 108 mmol/L (ref 98–111)
Creatinine, Ser: 0.54 mg/dL (ref 0.44–1.00)
GFR calc Af Amer: >60 mL/min (ref >60)
GFR calc non Af Amer: >60 mL/min (ref >60)
Glucose, Bld: 153 mg/dL — ABNORMAL HIGH (ref 70–99)
Potassium: 3.8 mmol/L (ref 3.5–5.1)
Sodium: 137 mmol/L (ref 135–145)
Total Bilirubin: 0.8 mg/dL (ref 0.3–1.2)
Total Protein: 5.6 g/dL — ABNORMAL LOW (ref 6.5–8.1)

## 2019-07-24 LAB — CBC
HCT: 29 % — ABNORMAL LOW (ref 36.0–46.0)
Hemoglobin: 8.1 g/dL — ABNORMAL LOW (ref 12.0–15.0)
MCH: 21 pg — ABNORMAL LOW (ref 26.0–34.0)
MCHC: 27.9 g/dL — ABNORMAL LOW (ref 30.0–36.0)
MCV: 75.1 fL — ABNORMAL LOW (ref 80.0–100.0)
Platelets: 209 10*3/uL (ref 150–400)
RBC: 3.86 MIL/uL — ABNORMAL LOW (ref 3.87–5.11)
RDW: 19.9 % — ABNORMAL HIGH (ref 11.5–15.5)
WBC: 14.2 10*3/uL — ABNORMAL HIGH (ref 4.0–10.5)
nRBC: 0 % (ref 0.0–0.2)

## 2019-07-24 LAB — HEPARIN LEVEL (UNFRACTIONATED): Heparin Unfractionated: 0.66 IU/mL (ref 0.30–0.70)

## 2019-07-24 LAB — LACTIC ACID, PLASMA: Lactic Acid, Venous: 0.8 mmol/L (ref 0.5–1.9)

## 2019-07-24 LAB — GLUCOSE, CAPILLARY
Glucose-Capillary: 117 mg/dL — ABNORMAL HIGH (ref 70–99)
Glucose-Capillary: 140 mg/dL — ABNORMAL HIGH (ref 70–99)
Glucose-Capillary: 153 mg/dL — ABNORMAL HIGH (ref 70–99)
Glucose-Capillary: 159 mg/dL — ABNORMAL HIGH (ref 70–99)

## 2019-07-24 LAB — APTT: aPTT: 133 seconds — ABNORMAL HIGH (ref 24–36)

## 2019-07-24 LAB — PROCALCITONIN: Procalcitonin: 0.22 ng/mL

## 2019-07-24 LAB — PROTIME-INR
INR: 1.3 — ABNORMAL HIGH (ref 0.8–1.2)
Prothrombin Time: 15.7 seconds — ABNORMAL HIGH (ref 11.4–15.2)

## 2019-07-24 LAB — SARS CORONAVIRUS 2 (TAT 6-24 HRS): SARS Coronavirus 2: NEGATIVE

## 2019-07-24 LAB — ECHOCARDIOGRAM COMPLETE

## 2019-07-24 MED ORDER — LACTATED RINGERS IV BOLUS
1000.0000 mL | Freq: Once | INTRAVENOUS | Status: AC
  Administered 2019-07-24: 1000 mL via INTRAVENOUS

## 2019-07-24 MED ORDER — LORAZEPAM 0.5 MG PO TABS
0.2500 mg | ORAL_TABLET | Freq: Four times a day (QID) | ORAL | Status: DC | PRN
  Administered 2019-07-24 – 2019-07-26 (×2): 0.25 mg via ORAL
  Filled 2019-07-24 (×2): qty 1

## 2019-07-24 MED ORDER — HEPARIN (PORCINE) 25000 UT/250ML-% IV SOLN
1000.0000 [IU]/h | INTRAVENOUS | Status: DC
  Administered 2019-07-24: 1150 [IU]/h via INTRAVENOUS
  Administered 2019-07-25: 1000 [IU]/h via INTRAVENOUS
  Filled 2019-07-24 (×2): qty 250

## 2019-07-24 MED ORDER — SODIUM CHLORIDE 0.9 % IV SOLN
2.0000 g | Freq: Two times a day (BID) | INTRAVENOUS | Status: DC
  Administered 2019-07-24 – 2019-07-26 (×4): 2 g via INTRAVENOUS
  Filled 2019-07-24 (×5): qty 2

## 2019-07-24 MED ORDER — HEPARIN BOLUS VIA INFUSION
3650.0000 [IU] | Freq: Once | INTRAVENOUS | Status: AC
  Administered 2019-07-24: 3650 [IU] via INTRAVENOUS
  Filled 2019-07-24: qty 3650

## 2019-07-24 NOTE — ED Notes (Signed)
Pt changed of urine and barrier cream applied.

## 2019-07-24 NOTE — ED Notes (Signed)
Pt given lunch tray. Pt cleaned of urine and given new brief.

## 2019-07-24 NOTE — ED Notes (Signed)
Pt comfortable. Pt waiting on lunch. Cleaned and new brief given. No further requests.

## 2019-07-24 NOTE — Progress Notes (Signed)
[facility] Cardiology    SUBJECTIVE: Patient states to be doing reasonably well mild backache lying in bed denies any fever no palpitations tachycardia left flank pain is improved tolerating antibiotics reasonably well denies any worsening pain no shortness of breath or dyspnea   Vitals:   07/24/19 1045 07/24/19 1100 07/24/19 1130 07/24/19 1200  BP:  125/67 109/85 (!) 138/56  Pulse: (!) 119 (!) 115 (!) 117 (!) 117  Resp: (!) 25 (!) 29  (!) 24  Temp:      TempSrc:      SpO2: 95% 92% 96% 98%     Intake/Output Summary (Last 24 hours) at 07/24/2019 1259 Last data filed at 07/24/2019 [id number] Gross per 24 hour  Intake 4443.12 ml  Output 600 ml  Net 3843.12 ml      PHYSICAL EXAM  General: Well developed, well nourished, in no acute distress HEENT:  Normocephalic and atramatic Neck:  No JVD.  Lungs: Clear bilaterally to auscultation and percussion. Heart: HRRR . Normal S1 and S2 without gallops or murmurs.  Abdomen: Bowel sounds are positive, abdomen soft and non-tender  Msk:  Back normal, normal gait. Normal strength and tone for age. Extremities: No clubbing, cyanosis or edema.   Neuro: Alert and oriented X 3. Psych:  Good affect, responds appropriately   LABS: Basic Metabolic Panel: Recent Labs    07/23/19 1317 07/24/19 0809  NA 136 137  K 4.6 3.8  CL 105 108  CO2 23 22  GLUCOSE 241* 153*  BUN 14 9  CREATININE 0.64 0.54  CALCIUM 10.7* 8.9   Liver Function Tests: Recent Labs    07/23/19 1317 07/24/19 0809  AST 32 17  ALT 13 9  ALKPHOS 50 35*  BILITOT 0.6 0.8  PROT 7.3 5.6*  ALBUMIN 4.0 2.8*   Recent Labs    07/23/19 1317  LIPASE 40   CBC: Recent Labs    07/23/19 1317 07/24/19 0809  WBC 17.3* 14.2*  HGB 9.5* 8.1*  HCT 34.5* 29.0*  MCV 75.5* 75.1*  PLT 277 209   Cardiac Enzymes: No results for input(s): CKTOTAL, CKMB, CKMBINDEX, TROPONINI in the last 72 hours. BNP: Invalid input(s): POCBNP D-Dimer: No results for input(s): DDIMER in the last 72  hours. Hemoglobin A1C: Recent Labs    07/23/19 1324  HGBA1C 6.6*   Fasting Lipid Panel: No results for input(s): CHOL, HDL, LDLCALC, TRIG, CHOLHDL, LDLDIRECT in the last 72 hours. Thyroid Function Tests: No results for input(s): TSH, T4TOTAL, T3FREE, THYROIDAB in the last 72 hours.  Invalid input(s): FREET3 Anemia Panel: No results for input(s): VITAMINB12, FOLATE, FERRITIN, TIBC, IRON, RETICCTPCT in the last 72 hours.  CT Chest W Contrast  Result Date: 07/23/2019 CLINICAL DATA:  Follow-up recent splenic infarcts EXAM: CT CHEST, ABDOMEN, AND PELVIS WITH CONTRAST TECHNIQUE: Multidetector CT imaging of the chest, abdomen and pelvis was performed following the standard protocol during bolus administration of intravenous contrast. CONTRAST:  167mL OMNIPAQUE IOHEXOL 300 MG/ML  SOLN COMPARISON:  CT from the same day. FINDINGS: CT CHEST FINDINGS Cardiovascular: The heart size is mildly enlarged. No significant pericardial effusion. Atherosclerotic changes are noted of the thoracic aorta without evidence for an aneurysm or dissection. The main pulmonary artery is mildly dilated. There is no large centrally located pulmonary embolism. Mediastinum/Nodes: --No mediastinal or hilar lymphadenopathy. --No axillary lymphadenopathy. --No supraclavicular lymphadenopathy. --Normal thyroid gland. --there is a right-sided thyroid nodule measuring approximately 2.8 cm. This appears relatively stable since 2015. Lungs/Pleura: There is a pulmonary nodule in the right  upper lobe measuring approximately 5 mm (axial series 4, image 39). This is essentially stable since 2015. There are multiple small subcentimeter pulmonary nodules in the right middle lobe and right lower lobe that are essentially stable since prior study. There is a stable 1 cm pulmonary nodule in the left lower lobe there is some atelectasis at the lung bases. There is no significant pleural effusion. There is no pneumothorax. Musculoskeletal: No chest  wall abnormality. No acute or significant osseous findings. CT ABDOMEN PELVIS FINDINGS Hepatobiliary: There is decreased hepatic attenuation suggestive of hepatic steatosis. Status post cholecystectomy.There is no biliary ductal dilation. Pancreas: Normal contours without ductal dilatation. No peripancreatic fluid collection. Spleen: There are multiple wedge-shaped hypoattenuating defects in the spleen, likely involving approximately 40-50% of the splenic parenchyma. There is no perisplenic hematoma. No evidence for active extravasation. Adrenals/Urinary Tract: --Adrenal glands: No adrenal hemorrhage. --Right kidney/ureter: Multiple nonobstructing stones are noted involving the right kidney. --Left kidney/ureter: There are nonobstructing stones involving the left kidney. --Urinary bladder: Unremarkable. Stomach/Bowel: --Stomach/Duodenum: No hiatal hernia or other gastric abnormality. Normal duodenal course and caliber. --Small bowel: No dilatation or inflammation. --Colon: Rectosigmoid diverticulosis without acute inflammation. --Appendix: Not visualized. No right lower quadrant inflammation or free fluid. Vascular/Lymphatic: Atherosclerotic calcification is present within the non-aneurysmal abdominal aorta, without hemodynamically significant stenosis. The splenic artery is grossly patent. --No retroperitoneal lymphadenopathy. --No mesenteric lymphadenopathy. --No pelvic or inguinal lymphadenopathy. Reproductive: There is a 2.1 cm cystic structure involving the left ovary. This is relatively stable since 2020 and is consistent with a benign process. Multiple calcified fibroids are noted. Other: No ascites or free air. The abdominal wall is normal. Musculoskeletal. 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 IMPRESSION: 1. Multiple wedge-shaped hypoattenuating defects in the spleen, likely involving approximately 40-50% of the splenic parenchyma. Findings are most consistent with splenic  infarcts in the absence of trauma. The splenic artery is grossly patent. 2. There is scattered bilateral pulmonary nodules, all of which appear relatively stable since 2015. No further follow-up is required. 3. Mild cardiomegaly. 4. Bilateral nonobstructing nephrolithiasis. 5. Rectosigmoid diverticulosis. 6. There is a 2.8 cm right-sided thyroid nodule which is essentially stable since 2015. No follow-up recommended unless clinically warranted (ref: J Am Coll Radiol. 2015 Feb;12(2): 143-50). 7. Aortic Atherosclerosis (ICD10-I70.0). Electronically Signed   By: Constance Holster M.D.   On: 07/23/2019 15:39   CT ABDOMEN PELVIS W CONTRAST  Result Date: 07/23/2019 CLINICAL DATA:  Follow-up recent splenic infarcts EXAM: CT CHEST, ABDOMEN, AND PELVIS WITH CONTRAST TECHNIQUE: Multidetector CT imaging of the chest, abdomen and pelvis was performed following the standard protocol during bolus administration of intravenous contrast. CONTRAST:  135mL OMNIPAQUE IOHEXOL 300 MG/ML  SOLN COMPARISON:  CT from the same day. FINDINGS: CT CHEST FINDINGS Cardiovascular: The heart size is mildly enlarged. No significant pericardial effusion. Atherosclerotic changes are noted of the thoracic aorta without evidence for an aneurysm or dissection. The main pulmonary artery is mildly dilated. There is no large centrally located pulmonary embolism. Mediastinum/Nodes: --No mediastinal or hilar lymphadenopathy. --No axillary lymphadenopathy. --No supraclavicular lymphadenopathy. --Normal thyroid gland. --there is a right-sided thyroid nodule measuring approximately 2.8 cm. This appears relatively stable since 2015. Lungs/Pleura: There is a pulmonary nodule in the right upper lobe measuring approximately 5 mm (axial series 4, image 39). This is essentially stable since 2015. There are multiple small subcentimeter pulmonary nodules in the right middle lobe and right lower lobe that are essentially stable since prior study. There is  a stable 1  cm pulmonary nodule in the left lower lobe there is some atelectasis at the lung bases. There is no significant pleural effusion. There is no pneumothorax. Musculoskeletal: No chest wall abnormality. No acute or significant osseous findings. CT ABDOMEN PELVIS FINDINGS Hepatobiliary: There is decreased hepatic attenuation suggestive of hepatic steatosis. Status post cholecystectomy.There is no biliary ductal dilation. Pancreas: Normal contours without ductal dilatation. No peripancreatic fluid collection. Spleen: There are multiple wedge-shaped hypoattenuating defects in the spleen, likely involving approximately 40-50% of the splenic parenchyma. There is no perisplenic hematoma. No evidence for active extravasation. Adrenals/Urinary Tract: --Adrenal glands: No adrenal hemorrhage. --Right kidney/ureter: Multiple nonobstructing stones are noted involving the right kidney. --Left kidney/ureter: There are nonobstructing stones involving the left kidney. --Urinary bladder: Unremarkable. Stomach/Bowel: --Stomach/Duodenum: No hiatal hernia or other gastric abnormality. Normal duodenal course and caliber. --Small bowel: No dilatation or inflammation. --Colon: Rectosigmoid diverticulosis without acute inflammation. --Appendix: Not visualized. No right lower quadrant inflammation or free fluid. Vascular/Lymphatic: Atherosclerotic calcification is present within the non-aneurysmal abdominal aorta, without hemodynamically significant stenosis. The splenic artery is grossly patent. --No retroperitoneal lymphadenopathy. --No mesenteric lymphadenopathy. --No pelvic or inguinal lymphadenopathy. Reproductive: There is a 2.1 cm cystic structure involving the left ovary. This is relatively stable since 2020 and is consistent with a benign process. Multiple calcified fibroids are noted. Other: No ascites or free air. The abdominal wall is normal. Musculoskeletal. 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  IMPRESSION: 1. Multiple wedge-shaped hypoattenuating defects in the spleen, likely involving approximately 40-50% of the splenic parenchyma. 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 The splenic artery is grossly patent. 2. There is scattered bilateral pulmonary nodules, all of which appear relatively stable since 2015. No further follow-up is required. 3. Mild cardiomegaly. 4. Bilateral nonobstructing nephrolithiasis. 5. Rectosigmoid diverticulosis. 6. There is a 2.8 cm right-sided thyroid nodule which is essentially stable since 2015. No follow-up recommended unless clinically warranted (ref: J Am Coll Radiol. 2015 Feb;12(2): 143-50). 7. Aortic Atherosclerosis (ICD10-I70.0). Electronically Signed   By: Constance Holster M.D.   On: 07/23/2019 15:39   CT Renal Stone Study  Result Date: 07/23/2019 CLINICAL DATA:  Pt arrives via EMS from home ([facility]) after having L flank pain since Wed that radiates to abdomen and back- pt has a hx of kidney infection and states that this feels similar EXAM: CT ABDOMEN AND PELVIS WITHOUT CONTRAST TECHNIQUE: Multidetector CT imaging of the abdomen and pelvis was performed following the standard protocol without IV contrast. COMPARISON:  CT abdomen pelvis 11/25/2018 FINDINGS: The most lateral aspect of the left lateral abdominal wall is excluded from field of view. Lower chest: Bibasilar atelectasis. Solid and ground-glass nodules in the right lung base appear stable. There is a 1.2 cm nodule in the left lung base which appears mildly increased in size compared to the prior study. Hepatobiliary: No focal liver abnormality is seen. No gallstones, gallbladder wall thickening, or biliary dilatation. Pancreas: Unremarkable. Spleen: New multiple areas of confluent hypoattenuation in the spleen. Spleen is normal in size. Adrenals/Urinary Tract: Adrenal glands are unremarkable. There are punctate calculi in the bilateral kidneys.  No hydronephrosis. Stomach/Bowel: Stomach is within normal limits. No evidence of bowel wall thickening, distention, or inflammatory changes. Multiple colonic diverticula without evidence of diverticulitis. Vascular/Lymphatic: Moderate aortoiliac atherosclerotic calcification without aneurysm. Vascular patency cannot be assessed in the absence of IV contrast. No lymphadenopathy. Reproductive: Fibroid uterus. Bilateral adnexa unremarkable. Other:  No abdominal wall hernia or abnormality. No abdominopelvic ascites. Musculoskeletal: Left hemipelvectomy. Degenerative changes in the right sacroiliac joint. IMPRESSION: 1. New multiple areas of confluent hypoattenuation in the spleen are nonspecific but differential includes splenic infarcts and metastatic disease. Contrast-enhanced evaluation is recommended. 2. Bilateral punctate nephrolithiasis without hydronephrosis. 3. Colonic diverticulosis without evidence of diverticulitis. 4. There is a 1.2 cm nodule in the left lung base which appears mildly increased in size compared to the prior study. Nonemergent chest CT is recommended for further evaluation. Aortic Atherosclerosis (ICD10-I70.0). Electronically Signed   By: Audie Pinto M.D.   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 Grayland Ormond Remote history of osteosarcoma status post surgery Anemia Leukocytosis with fever . Plan Continue broad-spectrum antibiotic therapy Agree with admission Follow-up EKGs troponins Recommend broad-spectrum cultures urine blood Surface echocardiogram for evaluation of possible carditis Consider transesophageal echocardiogram for SBE Temporarily switch to IV heparin  instead of Xarelto Recommend glipizide Metformin be continued for diabetes management and control Maintain omeprazole therapy for reflux symptoms     Yolonda Kida, MD 07/24/2019 12:59 PM

## 2019-07-24 NOTE — ED Notes (Signed)
Pt given meal tray. Hand hygiene provided.

## 2019-07-24 NOTE — Plan of Care (Signed)
Continuing with plan of care. 

## 2019-07-24 NOTE — Consult Note (Signed)
CARDIOLOGY CONSULT NOTE               Patient ID: Julie Acosta MRN: [id number] DOB/AGE: 01-24-1934 86 y.o.  Admit date: 07/23/2019 Referring Physician Dr. Kurtis Bushman hospitalist Primary Physician Dr. 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 Grayland Ormond has been doing reasonably well but started having acute left flank discomfort compliant with anticoagulation patient was brought to the emergency room was found to have a temperature of 102 white count of 17 she is already been vaccinated for Covid lactic acid was also elevated patient was treated with fluids CT of the abdomen suggested multiple wedge-shaped hypoattenuation defect suggestive of splenic infarcts patient was thought to have septic emboli so was admitted placed on broad-spectrum antibiotics and cardiology consultation was recommended for possible TEE.  Denies any chest pain no CVA no previous episodes remote history of DVTs has been treated for breast cancer still on Femara  Review of systems complete and found to be negative unless listed above     Past Medical History:  Diagnosis Date  . Anxiety   . Benign breast lumps   . Blood clotting disorder ([place])   . Bone cancer (HCC)    head of femur, left leg ... amputation at age 43  . Breast cancer of upper-outer quadrant of left female breast ([place]) 07/27/2017   11 mm invasive mammary carcinoma, ER 90%, PR 51-90%, negative margins.  Oncotype recurrence score: 1.  DCIS, margin less than 0.5 mm.  Negative sentinel node.  Accelerated partial breast radiation.  . Cervicalgia   . Chronic kidney disease    kidney stones  . Colon cancer [facility])    patient unaware of this  . Complication of anesthesia    mood  alteration / not sure if d/t pain medicine or anesthesia as she passed out   . Cough    NASAL DRIP / SNEEZING / SORE THROAT MOSTLY CONSTANT  . Depression   . Diabetes ([place])   . Diverticulitis   . Dizzy   . GERD (gastroesophageal reflux disease)   . H/O blood clots    arm and leg  . HBP (high blood pressure)   . Hematuria    gross  . Hemorrhoids   . HLD (hyperlipidemia)   . Muscle pain   . Osteoarthritis   . Osteoporosis   . Panic disorder   . Personal history of radiation therapy   . PND (post-nasal drip)    CHRONIC WITH SORE THROAT AND SNEEZING  . Reflux   . Skin cancer    nose  . Swelling   . Tremor   . Tremors of nervous system     Past Surgical History:  Procedure Laterality Date  . APPENDECTOMY  1962  . BREAST BIOPSY Right 2006?   benign  . BREAST BIOPSY Left 2019   invasive mammary carcinoma  . BREAST CYST EXCISION Left 07/27/2017   Procedure: SKIN CYST EXCISED;  Surgeon: Robert Bellow, MD;  Location: [facility] ORS;  Service: General;  Laterality: Left;  . BREAST LUMPECTOMY Left 07/2017   invasive mammary, DCIS  mammosite  . BREAST SURGERY Right 2019  . CARPAL TUNNEL RELEASE Right   . CATARACT EXTRACTION W/PHACO Left 11/11/2016   Procedure: CATARACT EXTRACTION PHACO AND INTRAOCULAR LENS PLACEMENT (IOC);  Surgeon:  Birder Robson, MD;  Location: [facility] ORS;  Service: Ophthalmology;  Laterality: Left;  [place] 01:07.9 AP% 19.2 CDE 13.02 Fluid pack lot # [id number]  . CATARACT EXTRACTION W/PHACO Right 12/09/2016   Procedure: CATARACT EXTRACTION PHACO AND INTRAOCULAR LENS PLACEMENT (IOC);  Surgeon: Birder Robson, MD;  Location: [facility] ORS;  Service: Ophthalmology;  Laterality: Right;  [place] 00:49 AP% 21.2 CDE 10.39 Fluid pack lot # [id number]  . CHOLECYSTECTOMY    . COLONOSCOPY WITH PROPOFOL N/A 11/22/2014   Procedure: COLONOSCOPY WITH PROPOFOL;  Surgeon: Manya Silvas, MD;  Location: [facility] ENDOSCOPY;  Service: Endoscopy;  Laterality: N/A;  . CYSTOSCOPY WITH STENT  PLACEMENT Bilateral 11/27/2018   Procedure: CYSTOSCOPY WITH STENT PLACEMENT;  Surgeon: Festus Aloe, MD;  Location: [facility] ORS;  Service: Urology;  Laterality: Bilateral;  . CYSTOSCOPY/URETEROSCOPY/HOLMIUM LASER/STENT PLACEMENT Bilateral 12/17/2018   Procedure: CYSTOSCOPY/URETEROSCOPY/HOLMIUM LASER/STENT Exchange;  Surgeon: Billey Co, MD;  Location: [facility] ORS;  Service: Urology;  Laterality: Bilateral;  . ESOPHAGOGASTRODUODENOSCOPY  11/22/2014   Procedure: ESOPHAGOGASTRODUODENOSCOPY (EGD);  Surgeon: Manya Silvas, MD;  Location: [facility] ENDOSCOPY;  Service: Endoscopy;;  . ESOPHAGOGASTRODUODENOSCOPY (EGD) WITH PROPOFOL N/A 03/05/2017   Procedure: ESOPHAGOGASTRODUODENOSCOPY (EGD) WITH PROPOFOL;  Surgeon: Lin Landsman, MD;  Location: [facility] ENDOSCOPY;  Service: Gastroenterology;  Laterality: N/A;  . EXTRACORPOREAL SHOCK WAVE LITHOTRIPSY Right 03/04/2018   Procedure: EXTRACORPOREAL SHOCK WAVE LITHOTRIPSY (ESWL);  Surgeon: Billey Co, MD;  Location: [facility] ORS;  Service: Urology;  Laterality: Right;  . EYE SURGERY Bilateral 2012   cataract extraction with iol  . GALLBLADDER SURGERY  1968  . hemi pelvectomy     left side at age 89  . HEMIPELVIC Left 1962  . LEG SURGERY Left    AMPUTATION d/t cancer at 86 years old  . MASTECTOMY, PARTIAL Left 07/27/2017   Procedure: MASTECTOMY PARTIAL;  Surgeon: Robert Bellow, MD;  Location: [facility] ORS;  Service: General;  Laterality: Left;  . MOUTH SURGERY  2019   teeth pulled with a bridge insertion.   fell out 12/16/18  . OVARY SURGERY Right    cyst removed  . SAVORY DILATION  11/22/2014   Procedure: SAVORY DILATION;  Surgeon: Manya Silvas, MD;  Location: [facility] ENDOSCOPY;  Service: Endoscopy;;  . SENTINEL NODE BIOPSY Left 07/27/2017   Procedure: SENTINEL NODE BIOPSY;  Surgeon: Robert Bellow, MD;  Location: [facility] ORS;  Service: General;  Laterality: Left;  . SKIN CANCER EXCISION     nose and arm and face  . TONSILLECTOMY      (Not in a  hospital admission)  Social History   Socioeconomic History  . Marital status: Divorced    Spouse name: Not on file  . Number of children: Not on file  . Years of education: Not on file  . Highest education level: Not on file  Occupational History  . Occupation: Pharmacist, hospital    Comment: retired  Tobacco Use  . Smoking status: Never Smoker  . Smokeless tobacco: Never Used  Substance and Sexual Activity  . Alcohol use: No  . Drug use: No  . Sexual activity: Not Currently  Other Topics Concern  . Not on file  Social History Narrative   Patient lives at [facility]   Social Determinants of Health   Financial Resource Strain:   . Difficulty of Paying Living Expenses:   Food Insecurity:   . Worried About Charity fundraiser in the Last Year:   . [place] in the Last Year:  Transportation Needs:   . Film/video editor (Medical):   Marland Kitchen Lack of Transportation (Non-Medical):   Physical Activity:   . Days of Exercise per Week:   . Minutes of Exercise per Session:   Stress:   . Feeling of Stress :   Social Connections:   . Frequency of Communication with Friends and Family:   . Frequency of Social Gatherings with Friends and Family:   . Attends Religious Services:   . Active Member of Clubs or Organizations:   . Attends Archivist Meetings:   Marland Kitchen Marital Status:   Intimate Partner Violence:   . Fear of Current or Ex-Partner:   . Emotionally Abused:   Marland Kitchen Physically Abused:   . Sexually Abused:     Family History  Problem Relation Age of Onset  . Breast cancer Paternal Aunt   . Ovarian cancer Sister   . Prostate cancer Father   . Stroke Mother   . Kidney cancer Neg Hx   . Bladder Cancer Neg Hx       Review of systems complete and found to be negative unless listed above      PHYSICAL EXAM  General: Well developed, well nourished, in no acute distress HEENT:  Normocephalic and atramatic Neck:  No JVD.  Lungs: Clear  bilaterally to auscultation and percussion. Heart: HRRR . Normal S1 and S2 without gallops or murmurs.  Abdomen: Bowel sounds are positive, abdomen soft and non-tender  Msk:  Back normal, normal gait. Normal strength and tone for age. Extremities: No clubbing, cyanosis or edema.  AKA on the left Neuro: Alert and oriented X 3. Psych:  Good affect, responds appropriately  Labs:   Lab Results  Component Value Date   WBC 17.3 (H) 07/23/2019   HGB 9.5 (L) 07/23/2019   HCT 34.5 (L) 07/23/2019   MCV 75.5 (L) 07/23/2019   PLT 277 07/23/2019    Recent Labs  Lab 07/23/19 1317  NA 136  K 4.6  CL 105  CO2 23  BUN 14  CREATININE 0.64  CALCIUM 10.7*  PROT 7.3  BILITOT 0.6  ALKPHOS 50  ALT 13  AST 32  GLUCOSE 241*   No results found for: CKTOTAL, CKMB, CKMBINDEX, TROPONINI No results found for: CHOL No results found for: HDL No results found for: LDLCALC No results found for: TRIG No results found for: CHOLHDL No results found for: LDLDIRECT    Radiology: Abdomen 1 view (KUB)  Result Date: 07/14/2019 CLINICAL DATA:  Kidney stones, history of nephrolithiasis. EXAM: ABDOMEN - 1 VIEW COMPARISON:  03/18/2018 FINDINGS: Nephrolithiasis on the right the least 4-5 calculi noted largest approximately 6 mm. Stool and gas overlie the left renal contour. Calcific density projecting over the medial aspect of the renal contour approximately 5 mm, potentially in the left renal pelvis. Lung bases are clear. Signs of left hemipelvectomy and spinal degenerative change. Scattered loops of gas-filled small bowel with gas and stool throughout the colon. No signs of obstruction. IMPRESSION: 1. Multiple right-sided renal calculi, largest approximately 5-6 mm. 2. Possible 5 mm left renal calculus. 3. Nonobstructive bowel gas pattern. 4. Changes of left hemipelvectomy. Electronically Signed   By: Zetta Bills M.D.   On: 07/14/2019 08:36   CT Chest W Contrast  Result Date: 07/23/2019 CLINICAL DATA:   Follow-up recent splenic infarcts EXAM: CT CHEST, ABDOMEN, AND PELVIS WITH CONTRAST TECHNIQUE: Multidetector CT imaging of the chest, abdomen and pelvis was performed following the standard protocol during bolus administration of intravenous  contrast. CONTRAST:  191mL OMNIPAQUE IOHEXOL 300 MG/ML  SOLN COMPARISON:  CT from the same day. FINDINGS: CT CHEST FINDINGS Cardiovascular: The heart size is mildly enlarged. No significant pericardial effusion. Atherosclerotic changes are noted of the thoracic aorta without evidence for an aneurysm or dissection. The main pulmonary artery is mildly dilated. There is no large centrally located pulmonary embolism. Mediastinum/Nodes: --No mediastinal or hilar lymphadenopathy. --No axillary lymphadenopathy. --No supraclavicular lymphadenopathy. --Normal thyroid gland. --there is a right-sided thyroid nodule measuring approximately 2.8 cm. This appears relatively stable since 2015. Lungs/Pleura: There is a pulmonary nodule in the right upper lobe measuring approximately 5 mm (axial series 4, image 39). This is essentially stable since 2015. There are multiple small subcentimeter pulmonary nodules in the right middle lobe and right lower lobe that are essentially stable since prior study. There is a stable 1 cm pulmonary nodule in the left lower lobe there is some atelectasis at the lung bases. There is no significant pleural effusion. There is no pneumothorax. Musculoskeletal: No chest wall abnormality. No acute or significant osseous findings. CT ABDOMEN PELVIS FINDINGS Hepatobiliary: There is decreased hepatic attenuation suggestive of hepatic steatosis. Status post cholecystectomy.There is no biliary ductal dilation. Pancreas: Normal contours without ductal dilatation. No peripancreatic fluid collection. Spleen: There are multiple wedge-shaped hypoattenuating defects in the spleen, likely involving approximately 40-50% of the splenic parenchyma. There is no perisplenic hematoma.  No evidence for active extravasation. Adrenals/Urinary Tract: --Adrenal glands: No adrenal hemorrhage. --Right kidney/ureter: Multiple nonobstructing stones are noted involving the right kidney. --Left kidney/ureter: There are nonobstructing stones involving the left kidney. --Urinary bladder: Unremarkable. Stomach/Bowel: --Stomach/Duodenum: No hiatal hernia or other gastric abnormality. Normal duodenal course and caliber. --Small bowel: No dilatation or inflammation. --Colon: Rectosigmoid diverticulosis without acute inflammation. --Appendix: Not visualized. No right lower quadrant inflammation or free fluid. Vascular/Lymphatic: Atherosclerotic calcification is present within the non-aneurysmal abdominal aorta, without hemodynamically significant stenosis. The splenic artery is grossly patent. --No retroperitoneal lymphadenopathy. --No mesenteric lymphadenopathy. --No pelvic or inguinal lymphadenopathy. Reproductive: There is a 2.1 cm cystic structure involving the left ovary. This is relatively stable since 2020 and is consistent with a benign process. Multiple calcified fibroids are noted. Other: No ascites or free air. The abdominal wall is normal. Musculoskeletal. 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 IMPRESSION: 1. Multiple wedge-shaped hypoattenuating defects in the spleen, likely involving approximately 40-50% of the splenic parenchyma. 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 The splenic artery is grossly patent. 2. There is scattered bilateral pulmonary nodules, all of which appear relatively stable since 2015. No further follow-up is required. 3. Mild cardiomegaly. 4. Bilateral nonobstructing nephrolithiasis. 5. Rectosigmoid diverticulosis. 6. There is a 2.8 cm right-sided thyroid nodule which is essentially stable since 2015. No follow-up recommended unless clinically warranted (ref: J Am Coll Radiol. 2015 Feb;12(2): 143-50). 7. Aortic  Atherosclerosis (ICD10-I70.0). Electronically Signed   By: Constance Holster M.D.   On: 07/23/2019 15:39   CT ABDOMEN PELVIS W CONTRAST  Result Date: 07/23/2019 CLINICAL DATA:  Follow-up recent splenic infarcts EXAM: CT CHEST, ABDOMEN, AND PELVIS WITH CONTRAST TECHNIQUE: Multidetector CT imaging of the chest, abdomen and pelvis was performed following the standard protocol during bolus administration of intravenous contrast. CONTRAST:  148mL OMNIPAQUE IOHEXOL 300 MG/ML  SOLN COMPARISON:  CT from the same day. FINDINGS: CT CHEST FINDINGS Cardiovascular: The heart size is mildly enlarged. No significant pericardial effusion. Atherosclerotic changes are noted of the thoracic aorta without evidence  for an aneurysm or dissection. The main pulmonary artery is mildly dilated. There is no large centrally located pulmonary embolism. Mediastinum/Nodes: --No mediastinal or hilar lymphadenopathy. --No axillary lymphadenopathy. --No supraclavicular lymphadenopathy. --Normal thyroid gland. --there is a right-sided thyroid nodule measuring approximately 2.8 cm. This appears relatively stable since 2015. Lungs/Pleura: There is a pulmonary nodule in the right upper lobe measuring approximately 5 mm (axial series 4, image 39). This is essentially stable since 2015. There are multiple small subcentimeter pulmonary nodules in the right middle lobe and right lower lobe that are essentially stable since prior study. There is a stable 1 cm pulmonary nodule in the left lower lobe there is some atelectasis at the lung bases. There is no significant pleural effusion. There is no pneumothorax. Musculoskeletal: No chest wall abnormality. No acute or significant osseous findings. CT ABDOMEN PELVIS FINDINGS Hepatobiliary: There is decreased hepatic attenuation suggestive of hepatic steatosis. Status post cholecystectomy.There is no biliary ductal dilation. Pancreas: Normal contours without ductal dilatation. No peripancreatic fluid  collection. Spleen: There are multiple wedge-shaped hypoattenuating defects in the spleen, likely involving approximately 40-50% of the splenic parenchyma. There is no perisplenic hematoma. No evidence for active extravasation. Adrenals/Urinary Tract: --Adrenal glands: No adrenal hemorrhage. --Right kidney/ureter: Multiple nonobstructing stones are noted involving the right kidney. --Left kidney/ureter: There are nonobstructing stones involving the left kidney. --Urinary bladder: Unremarkable. Stomach/Bowel: --Stomach/Duodenum: No hiatal hernia or other gastric abnormality. Normal duodenal course and caliber. --Small bowel: No dilatation or inflammation. --Colon: Rectosigmoid diverticulosis without acute inflammation. --Appendix: Not visualized. No right lower quadrant inflammation or free fluid. Vascular/Lymphatic: Atherosclerotic calcification is present within the non-aneurysmal abdominal aorta, without hemodynamically significant stenosis. The splenic artery is grossly patent. --No retroperitoneal lymphadenopathy. --No mesenteric lymphadenopathy. --No pelvic or inguinal lymphadenopathy. Reproductive: There is a 2.1 cm cystic structure involving the left ovary. This is relatively stable since 2020 and is consistent with a benign process. Multiple calcified fibroids are noted. Other: No ascites or free air. The abdominal wall is normal. Musculoskeletal. 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 IMPRESSION: 1. Multiple wedge-shaped hypoattenuating defects in the spleen, likely involving approximately 40-50% of the splenic parenchyma. 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 The splenic artery is grossly patent. 2. There is scattered bilateral pulmonary nodules, all of which appear relatively stable since 2015. No further follow-up is required. 3. Mild cardiomegaly. 4. Bilateral nonobstructing nephrolithiasis. 5. Rectosigmoid diverticulosis. 6. There  is a 2.8 cm right-sided thyroid nodule which is essentially stable since 2015. No follow-up recommended unless clinically warranted (ref: J Am Coll Radiol. 2015 Feb;12(2): 143-50). 7. Aortic Atherosclerosis (ICD10-I70.0). Electronically Signed   By: Constance Holster M.D.   On: 07/23/2019 15:39   CT Renal Stone Study  Result Date: 07/23/2019 CLINICAL DATA:  Pt arrives via EMS from home ([facility]) after having L flank pain since Wed that radiates to abdomen and back- pt has a hx of kidney infection and states that this feels similar EXAM: CT ABDOMEN AND PELVIS WITHOUT CONTRAST TECHNIQUE: Multidetector CT imaging of the abdomen and pelvis was performed following the standard protocol without IV contrast. COMPARISON:  CT abdomen pelvis 11/25/2018 FINDINGS: The most lateral aspect of the left lateral abdominal wall is excluded from field of view. Lower chest: Bibasilar atelectasis. Solid and ground-glass nodules in the right lung base appear stable. There is a 1.2 cm nodule in the left lung base which appears mildly increased in size compared to  the prior study. Hepatobiliary: No focal liver abnormality is seen. No gallstones, gallbladder wall thickening, or biliary dilatation. Pancreas: Unremarkable. Spleen: New multiple areas of confluent hypoattenuation in the spleen. Spleen is normal in size. Adrenals/Urinary Tract: Adrenal glands are unremarkable. There are punctate calculi in the bilateral kidneys. No hydronephrosis. Stomach/Bowel: Stomach is within normal limits. No evidence of bowel wall thickening, distention, or inflammatory changes. Multiple colonic diverticula without evidence of diverticulitis. Vascular/Lymphatic: Moderate aortoiliac atherosclerotic calcification without aneurysm. Vascular patency cannot be assessed in the absence of IV contrast. No lymphadenopathy. Reproductive: Fibroid uterus. Bilateral adnexa unremarkable. Other: No abdominal wall hernia or abnormality. No  abdominopelvic ascites. Musculoskeletal: Left hemipelvectomy. Degenerative changes in the right sacroiliac joint. IMPRESSION: 1. New multiple areas of confluent hypoattenuation in the spleen are nonspecific but differential includes splenic infarcts and metastatic disease. Contrast-enhanced evaluation is recommended. 2. Bilateral punctate nephrolithiasis without hydronephrosis. 3. Colonic diverticulosis without evidence of diverticulitis. 4. There is a 1.2 cm nodule in the left lung base which appears mildly increased in size compared to the prior study. Nonemergent chest CT is recommended for further evaluation. Aortic Atherosclerosis (ICD10-I70.0). Electronically Signed   By: Audie Pinto M.D.   On: 07/23/2019 14:11    EKG: Sinus tachycardia nonspecific ST-T wave changes  ASSESSMENT AND PLAN:  Sepsis Splenic infarct Coagulopathy secondary to Xarelto History of DVT PE Diabetes type 2 Obesity History of breast cancer History of osteosarcoma status post surgery Anemia Leukocytosis Fever . Plan Agree with admit Continue broad-spectrum antibiotic therapy for possible septic emboli Follow-up cultures blood urine Continue gentle fluid hydration Surface echocardiogram for evaluation of possible SBE Consider TEE for further assessment evaluation Continue anticoagulation switch to heparin hold Xarelto for now Patient has significant emboli to the spleen is not clear to me that they are septic could be thrombotic with acute phase reactant with tachycardia and fever Continue current therapy with oncology Dr. Grayland Ormond Continue diabetes management and control on glipizide Metformin Continue omeprazole therapy for reflux symptoms Recommend conservative cardiac input beyond TEE  Signed: Yolonda Kida MD 07/24/2019, 8:26 AM

## 2019-07-24 NOTE — ED Notes (Signed)
Pt asking for snack, pt given diet sprite and crackers.

## 2019-07-24 NOTE — ED Notes (Signed)
Pt placed on hospital bed for comfort. New orders for anxiety. Spoke to and updated pt's daughter.

## 2019-07-24 NOTE — Consult Note (Signed)
Pharmacy Antibiotic Note  Julie Acosta is a 86 y.o. female admitted on 07/23/2019 with sepsis.  Pharmacy has been consulted for Vancomycin dosing. Patient had Vancomycin 1g in the ED ordered for today- she has not received   Plan: 1. Will order vancomycin 750 mg x1 dose for a total of 1750 mg loading dose. Will order Vancomycin maintenance dose 1000 mg Q36 hours. AUC goal 400-550. Expected AUC 505. Expected Cssmin 10.6.  Scr used: 0.8 mg/dL (actual Scr 0.64 mg/dL)  VD used 0.5 L/kg   2. Cefepime 2g q12h  Temp (24hrs), Avg:100 F (37.8 C), Min:98 F (36.7 C), Max:102 F (38.9 C)  Recent Labs  Lab 07/19/19 1027 07/23/19 1317 07/23/19 1610 07/24/19 0809 07/24/19 0941  WBC 7.5 17.3*  --  14.2*  --   CREATININE 0.78 0.64  --  0.54  --   LATICACIDVEN  --   --  2.4*  --  0.8    Estimated Creatinine Clearance: 47.5 mL/min (by C-G formula based on SCr of 0.54 mg/dL).    Allergies  Allergen Reactions  . Ciprofloxacin Hives  . Codeine Other (See Comments)    HALLUCINATIONS  . Tape     Rash and skin irritation/ paper tape and tegaderm OK  . Ciprocinonide [Fluocinolone] Other (See Comments)    unknown  . Amoxicillin Other (See Comments)    Upset stomach Has patient had a PCN reaction causing immediate rash, facial/tongue/throat swelling, SOB or lightheadedness with hypotension: No Has patient had a PCN reaction causing severe rash involving mucus membranes or skin necrosis: No Has patient had a PCN reaction that required hospitalization: No Has patient had a PCN reaction occurring within the last 10 years: Yes If all of the above answers are "NO", then may proceed with Cephalosporin use.;  . Cefuroxime Other (See Comments)    OK INTRACAMERALLY PER DR WLP, upset stomach  . Lipitor [Atorvastatin] Other (See Comments)    unknown    Antimicrobials this admission:   Dose adjustments this admission:   Microbiology results: BCx 3/20 pending  Thank you for allowing pharmacy  to be a part of this patient's care.  Lu Duffel, PharmD, BCPS Clinical Pharmacist 07/24/2019 3:49 PM

## 2019-07-24 NOTE — Progress Notes (Addendum)
PROGRESS NOTE    Julie Acosta  [id number] DOB: Jun 03, 1933 DOA: 07/23/2019 PCP: [place] Pink, MD    Brief Narrative:  Julie Acosta is a 86 y.o. female with medical history significant of  anxiety, breast cancer, diabetes, osteopenia, left leg sarcoma, status post left hemipelvectomy and amputation with no evidence of recurrence presents to the emergency department for left flank pain. 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 Multiple wedge-shaped hypoattenuating defects in the spleen, likely involving approximately 40-50% of the splenic parenchyma. 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 The splenic artery is grossly patent. 2. There is scattered bilateral pulmonary nodules, all of which appear relatively stable since 2015. No further follow-up is required. 3. Mild cardiomegaly. 4. Bilateral nonobstructing nephrolithiasis. 5. Rectosigmoid diverticulosis. 6. There is a 2.8 cm right-sided thyroid nodule which is essentially stable since 2015. No follow-up recommended unless clinically warranted (ref: J Am Coll Radiol. 2015 Feb;12(2): 143-50). 7. Aortic Atherosclerosis (ICD10-I70.0).  Antimicrobials:       Subjective: Feels anxious being in ER room. Wants door open. Denies sob, cp. abd pain little better. Has back pain from the bed, er changing her bed.  Objective: Vitals:   07/24/19 0400 07/24/19 0430 07/24/19 0530 07/24/19 0800  BP: (!) 142/76 (!) 138/58 (!) 138/52 (!) 116/51  Pulse: (!) 115 (!) 115 (!) 117 (!) 117  Resp: (!) 35 (!) 30 (!) 30 (!) 26  Temp:      TempSrc:      SpO2: 95% 95% 96% (!) 89%    Intake/Output Summary (Last 24 hours) at 07/24/2019 0824 Last data filed at 07/24/2019 0706 Gross per 24 hour  Intake 4443.12 ml  Output 600 ml  Net 3843.12 ml   There were no vitals filed for this visit.  Examination:  General exam: Appears  calm and comfortable  Respiratory system: Clear to auscultation. Respiratory effort normal. Cardiovascular system: S1 & S2 heard, RRR. No JVD, murmurs, rubs, No pedal edema. Gastrointestinal system: Abdomen is nondistended, soft and nontender. Normal bowel sounds heard. Central nervous system: Alert and oriented.  Grossly intact Extremities: Right lower extremity no edema, left lower extremity amputated Skin: No rashes, lesions or ulcers Psychiatry: Judgement and insight appear normal. Mood & affect appropriate.     Data Reviewed: I have personally reviewed following labs and imaging studies  CBC: Recent Labs  Lab 07/19/19 1027 07/23/19 1317  WBC 7.5 17.3*  NEUTROABS 5.1  --   HGB 9.4* 9.5*  HCT 33.4* 34.5*  MCV 74.9* 75.5*  PLT 371 [id number]   Basic Metabolic Panel: Recent Labs  Lab 07/19/19 1027 07/23/19 1317  NA 137 136  K 4.7 4.6  CL 108 105  CO2 20* 23  GLUCOSE 213* 241*  BUN 23 14  CREATININE 0.78 0.64  CALCIUM 10.1 10.7*   GFR: Estimated Creatinine Clearance: 47.5 mL/min (by C-G formula based on SCr of 0.64 mg/dL). Liver Function Tests: Recent Labs  Lab 07/23/19 1317  AST 32  ALT 13  ALKPHOS 50  BILITOT 0.6  PROT 7.3  ALBUMIN 4.0   Recent Labs  Lab 07/23/19 1317  LIPASE 40   No results for input(s): AMMONIA in the last 168 hours. Coagulation Profile: No results for input(s): INR, PROTIME in the last 168 hours. Cardiac Enzymes: No results for input(s): CKTOTAL, CKMB, CKMBINDEX, TROPONINI in the last 168 hours. BNP (last 3 results) No results for input(s): PROBNP  in the last 8760 hours. HbA1C: Recent Labs    07/23/19 1324  HGBA1C 6.6*   CBG: Recent Labs  Lab 07/23/19 2225 07/24/19 0611  GLUCAP 201* 117*   Lipid Profile: No results for input(s): CHOL, HDL, LDLCALC, TRIG, CHOLHDL, LDLDIRECT in the last 72 hours. Thyroid Function Tests: No results for input(s): TSH, T4TOTAL, FREET4, T3FREE, THYROIDAB in the last 72 hours. Anemia Panel: No  results for input(s): VITAMINB12, FOLATE, FERRITIN, TIBC, IRON, RETICCTPCT in the last 72 hours. Sepsis Labs: Recent Labs  Lab 07/23/19 1610 07/23/19 1649  PROCALCITON  --  0.17  LATICACIDVEN 2.4*  --     Recent Results (from the past 240 hour(s))  Blood Culture (routine x 2)     Status: None (Preliminary result)   Collection Time: 07/23/19  4:54 PM   Specimen: BLOOD  Result Value Ref Range Status   Specimen Description BLOOD RIGHT ANTECUBITAL  Final   Special Requests   Final    BOTTLES DRAWN AEROBIC AND ANAEROBIC Blood Culture adequate volume   Culture   Final    NO GROWTH < 24 HOURS Performed at [facility], [address], [place], [place] [address]    Report Status PENDING  Incomplete  Blood Culture (routine x 2)     Status: None (Preliminary result)   Collection Time: 07/23/19  6:17 PM   Specimen: BLOOD  Result Value Ref Range Status   Specimen Description BLOOD LAC  Final   Special Requests   Final    BOTTLES DRAWN AEROBIC AND ANAEROBIC Blood Culture adequate volume   Culture   Final    NO GROWTH < 12 HOURS Performed at [facility], [address], [place], [place] [address]    Report Status PENDING  Incomplete         Radiology Studies: CT Chest W Contrast  Result Date: 07/23/2019 CLINICAL DATA:  Follow-up recent splenic infarcts EXAM: CT CHEST, ABDOMEN, AND PELVIS WITH CONTRAST TECHNIQUE: Multidetector CT imaging of the chest, abdomen and pelvis was performed following the standard protocol during bolus administration of intravenous contrast. CONTRAST:  168mL OMNIPAQUE IOHEXOL 300 MG/ML  SOLN COMPARISON:  CT from the same day. FINDINGS: CT CHEST FINDINGS Cardiovascular: The heart size is mildly enlarged. No significant pericardial effusion. Atherosclerotic changes are noted of the thoracic aorta without evidence for an aneurysm or dissection. The main pulmonary artery is mildly dilated. There is no large centrally located pulmonary embolism.  Mediastinum/Nodes: --No mediastinal or hilar lymphadenopathy. --No axillary lymphadenopathy. --No supraclavicular lymphadenopathy. --Normal thyroid gland. --there is a right-sided thyroid nodule measuring approximately 2.8 cm. This appears relatively stable since 2015. Lungs/Pleura: There is a pulmonary nodule in the right upper lobe measuring approximately 5 mm (axial series 4, image 39). This is essentially stable since 2015. There are multiple small subcentimeter pulmonary nodules in the right middle lobe and right lower lobe that are essentially stable since prior study. There is a stable 1 cm pulmonary nodule in the left lower lobe there is some atelectasis at the lung bases. There is no significant pleural effusion. There is no pneumothorax. Musculoskeletal: No chest wall abnormality. No acute or significant osseous findings. CT ABDOMEN PELVIS FINDINGS Hepatobiliary: There is decreased hepatic attenuation suggestive of hepatic steatosis. Status post cholecystectomy.There is no biliary ductal dilation. Pancreas: Normal contours without ductal dilatation. No peripancreatic fluid collection. Spleen: There are multiple wedge-shaped hypoattenuating defects in the spleen, likely involving approximately 40-50% of the splenic parenchyma. There is no perisplenic hematoma. No evidence  for active extravasation. Adrenals/Urinary Tract: --Adrenal glands: No adrenal hemorrhage. --Right kidney/ureter: Multiple nonobstructing stones are noted involving the right kidney. --Left kidney/ureter: There are nonobstructing stones involving the left kidney. --Urinary bladder: Unremarkable. Stomach/Bowel: --Stomach/Duodenum: No hiatal hernia or other gastric abnormality. Normal duodenal course and caliber. --Small bowel: No dilatation or inflammation. --Colon: Rectosigmoid diverticulosis without acute inflammation. --Appendix: Not visualized. No right lower quadrant inflammation or free fluid. Vascular/Lymphatic: Atherosclerotic  calcification is present within the non-aneurysmal abdominal aorta, without hemodynamically significant stenosis. The splenic artery is grossly patent. --No retroperitoneal lymphadenopathy. --No mesenteric lymphadenopathy. --No pelvic or inguinal lymphadenopathy. Reproductive: There is a 2.1 cm cystic structure involving the left ovary. This is relatively stable since 2020 and is consistent with a benign process. Multiple calcified fibroids are noted. Other: No ascites or free air. The abdominal wall is normal. Musculoskeletal. 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 IMPRESSION: 1. Multiple wedge-shaped hypoattenuating defects in the spleen, likely involving approximately 40-50% of the splenic parenchyma. 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 The splenic artery is grossly patent. 2. There is scattered bilateral pulmonary nodules, all of which appear relatively stable since 2015. No further follow-up is required. 3. Mild cardiomegaly. 4. Bilateral nonobstructing nephrolithiasis. 5. Rectosigmoid diverticulosis. 6. There is a 2.8 cm right-sided thyroid nodule which is essentially stable since 2015. No follow-up recommended unless clinically warranted (ref: J Am Coll Radiol. 2015 Feb;12(2): 143-50). 7. Aortic Atherosclerosis (ICD10-I70.0). Electronically Signed   By: Constance Holster M.D.   On: 07/23/2019 15:39   CT ABDOMEN PELVIS W CONTRAST  Result Date: 07/23/2019 CLINICAL DATA:  Follow-up recent splenic infarcts EXAM: CT CHEST, ABDOMEN, AND PELVIS WITH CONTRAST TECHNIQUE: Multidetector CT imaging of the chest, abdomen and pelvis was performed following the standard protocol during bolus administration of intravenous contrast. CONTRAST:  130mL OMNIPAQUE IOHEXOL 300 MG/ML  SOLN COMPARISON:  CT from the same day. FINDINGS: CT CHEST FINDINGS Cardiovascular: The heart size is mildly enlarged. No significant pericardial effusion. Atherosclerotic  changes are noted of the thoracic aorta without evidence for an aneurysm or dissection. The main pulmonary artery is mildly dilated. There is no large centrally located pulmonary embolism. Mediastinum/Nodes: --No mediastinal or hilar lymphadenopathy. --No axillary lymphadenopathy. --No supraclavicular lymphadenopathy. --Normal thyroid gland. --there is a right-sided thyroid nodule measuring approximately 2.8 cm. This appears relatively stable since 2015. Lungs/Pleura: There is a pulmonary nodule in the right upper lobe measuring approximately 5 mm (axial series 4, image 39). This is essentially stable since 2015. There are multiple small subcentimeter pulmonary nodules in the right middle lobe and right lower lobe that are essentially stable since prior study. There is a stable 1 cm pulmonary nodule in the left lower lobe there is some atelectasis at the lung bases. There is no significant pleural effusion. There is no pneumothorax. Musculoskeletal: No chest wall abnormality. No acute or significant osseous findings. CT ABDOMEN PELVIS FINDINGS Hepatobiliary: There is decreased hepatic attenuation suggestive of hepatic steatosis. Status post cholecystectomy.There is no biliary ductal dilation. Pancreas: Normal contours without ductal dilatation. No peripancreatic fluid collection. Spleen: There are multiple wedge-shaped hypoattenuating defects in the spleen, likely involving approximately 40-50% of the splenic parenchyma. There is no perisplenic hematoma. No evidence for active extravasation. Adrenals/Urinary Tract: --Adrenal glands: No adrenal hemorrhage. --Right kidney/ureter: Multiple nonobstructing stones are noted involving the right kidney. --Left kidney/ureter: There are nonobstructing stones involving the left kidney. --Urinary bladder: Unremarkable. Stomach/Bowel: --Stomach/Duodenum: No hiatal hernia or other gastric  abnormality. Normal duodenal course and caliber. --Small bowel: No dilatation or  inflammation. --Colon: Rectosigmoid diverticulosis without acute inflammation. --Appendix: Not visualized. No right lower quadrant inflammation or free fluid. Vascular/Lymphatic: Atherosclerotic calcification is present within the non-aneurysmal abdominal aorta, without hemodynamically significant stenosis. The splenic artery is grossly patent. --No retroperitoneal lymphadenopathy. --No mesenteric lymphadenopathy. --No pelvic or inguinal lymphadenopathy. Reproductive: There is a 2.1 cm cystic structure involving the left ovary. This is relatively stable since 2020 and is consistent with a benign process. Multiple calcified fibroids are noted. Other: No ascites or free air. The abdominal wall is normal. Musculoskeletal. 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 IMPRESSION: 1. Multiple wedge-shaped hypoattenuating defects in the spleen, likely involving approximately 40-50% of the splenic parenchyma. 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 The splenic artery is grossly patent. 2. There is scattered bilateral pulmonary nodules, all of which appear relatively stable since 2015. No further follow-up is required. 3. Mild cardiomegaly. 4. Bilateral nonobstructing nephrolithiasis. 5. Rectosigmoid diverticulosis. 6. There is a 2.8 cm right-sided thyroid nodule which is essentially stable since 2015. No follow-up recommended unless clinically warranted (ref: J Am Coll Radiol. 2015 Feb;12(2): 143-50). 7. Aortic Atherosclerosis (ICD10-I70.0). Electronically Signed   By: Constance Holster M.D.   On: 07/23/2019 15:39   CT Renal Stone Study  Result Date: 07/23/2019 CLINICAL DATA:  Pt arrives via EMS from home ([facility]) after having L flank pain since Wed that radiates to abdomen and back- pt has a hx of kidney infection and states that this feels similar EXAM: CT ABDOMEN AND PELVIS WITHOUT CONTRAST TECHNIQUE: Multidetector CT imaging of  the abdomen and pelvis was performed following the standard protocol without IV contrast. COMPARISON:  CT abdomen pelvis 11/25/2018 FINDINGS: The most lateral aspect of the left lateral abdominal wall is excluded from field of view. Lower chest: Bibasilar atelectasis. Solid and ground-glass nodules in the right lung base appear stable. There is a 1.2 cm nodule in the left lung base which appears mildly increased in size compared to the prior study. Hepatobiliary: No focal liver abnormality is seen. No gallstones, gallbladder wall thickening, or biliary dilatation. Pancreas: Unremarkable. Spleen: New multiple areas of confluent hypoattenuation in the spleen. Spleen is normal in size. Adrenals/Urinary Tract: Adrenal glands are unremarkable. There are punctate calculi in the bilateral kidneys. No hydronephrosis. Stomach/Bowel: Stomach is within normal limits. No evidence of bowel wall thickening, distention, or inflammatory changes. Multiple colonic diverticula without evidence of diverticulitis. Vascular/Lymphatic: Moderate aortoiliac atherosclerotic calcification without aneurysm. Vascular patency cannot be assessed in the absence of IV contrast. No lymphadenopathy. Reproductive: Fibroid uterus. Bilateral adnexa unremarkable. Other: No abdominal wall hernia or abnormality. No abdominopelvic ascites. Musculoskeletal: Left hemipelvectomy. Degenerative changes in the right sacroiliac joint. IMPRESSION: 1. New multiple areas of confluent hypoattenuation in the spleen are nonspecific but differential includes splenic infarcts and metastatic disease. Contrast-enhanced evaluation is recommended. 2. Bilateral punctate nephrolithiasis without hydronephrosis. 3. Colonic diverticulosis without evidence of diverticulitis. 4. There is a 1.2 cm nodule in the left lung base which appears mildly increased in size compared to the prior study. Nonemergent chest CT is recommended for further evaluation. Aortic Atherosclerosis  (ICD10-I70.0). Electronically Signed   By: Audie Pinto M.D.   On: 07/23/2019 14:11        Scheduled Meds: . fenofibrate  160 mg Oral Daily  . insulin aspart  0-15 Units Subcutaneous Q4H  . letrozole  2.5 mg Oral Daily  .  metoprolol succinate  12.5 mg Oral QHS  . multivitamin with minerals  1 tablet Oral Daily  . pantoprazole  40 mg Oral Daily  . PARoxetine  40 mg Oral Daily  . raloxifene  60 mg Oral Daily  . sodium chloride flush  3 mL Intravenous Q12H   Continuous Infusions: . sodium chloride 100 mL/hr at 07/24/19 0607  . [START ON 07/25/2019] vancomycin      Assessment & Plan:   Active Problems:   Splenic infarct   1,sepsis without shock- etiology unclear Patient with tachycardia, fever, leukocytosis CT scan with no infection but however found splenic multiple infarcts continue empirically treat her with IV antibiotics, started with Vanco and cefepime in the ER we will continue Check urine culture urinalysis, blood cultures   2.  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 Clayborn Bigness for TEE that would likely be done on Monday consult oncology pending   3.  Diabetes mellitus type 2-we will hold p.o. medications Placed on R ISS, check fingersticks Check A1c  4.  History of breast cancer-continue medications  DVT prophylaxis: heparin gtt Code Status: DNR  Family Communication:  Updated daughter Disposition Plan: back home Barrier: Needs work-up for splenic infarct.  Echo pending.  Possible TEE in a.m.  Still with abdominal pain.         LOS: 1 day   Time spent: 45 minutes with more than 50% COC    Nolberto Hanlon, MD [facility] Pager [phone]  If 7PM-7AM, please contact night-coverage [email or web address] Password [facility] 07/24/2019, 8:24 AM

## 2019-07-24 NOTE — ED Notes (Signed)
Pt urinated on bed. Pt changed, helped onto bedpan, and given new blankets. Pt given new water.

## 2019-07-24 NOTE — Consult Note (Signed)
[place] for Heparin  Indication: DVT  Allergies  Allergen Reactions  . Ciprofloxacin Hives  . Codeine Other (See Comments)    HALLUCINATIONS  . Tape     Rash and skin irritation/ paper tape and tegaderm OK  . Ciprocinonide [Fluocinolone] Other (See Comments)    unknown  . Amoxicillin Other (See Comments)    Upset stomach Has patient had a PCN reaction causing immediate rash, facial/tongue/throat swelling, SOB or lightheadedness with hypotension: No Has patient had a PCN reaction causing severe rash involving mucus membranes or skin necrosis: No Has patient had a PCN reaction that required hospitalization: No Has patient had a PCN reaction occurring within the last 10 years: Yes If all of the above answers are "NO", then may proceed with Cephalosporin use.;  . Cefuroxime Other (See Comments)    OK INTRACAMERALLY PER DR WLP, upset stomach  . Lipitor [Atorvastatin] Other (See Comments)    unknown    Patient Measurements:   Heparin Dosing Weight: 73 kg  Vital Signs: Temp: 98.5 F (36.9 C) (03/21 1959) Temp Source: Oral (03/21 1959) BP: 127/106 (03/21 1959) Pulse Rate: 117 (03/21 1959)  Labs: Recent Labs    07/23/19 1317 07/23/19 1817 07/24/19 0809 07/24/19 1856  HGB 9.5*  --  8.1*  --   HCT 34.5*  --  29.0*  --   PLT 277  --  209  --   APTT  --  41*  --  133*  LABPROT  --   --  15.7*  --   INR  --   --  1.3*  --   HEPARINUNFRC  --  >3.60*  --  0.66  CREATININE 0.64  --  0.54  --     Estimated Creatinine Clearance: 47.5 mL/min (by C-G formula based on SCr of 0.54 mg/dL).   Medications:  Xarelto 20 mg - last dose 07/23/19 @ 10 AM   Assessment: Pharmacy has been consulted for heparin dosing in a patient taking Xarelto for suspected DVT. Per chart review, she has been on Xarelto since 2004 but she is unsure of why.  Dr. Kurtis Bushman spoke to the patient's daughter who believes her mother had multiple blood clots and one in the leg  as well as a family history of DVT. At this time, Xarelto is being held and heparin started d/t Splenic multiple infarcts.   Will need to order baseline labs for aPTT and HL. CBC appropriate to start heparin. Will base levels on aPTT for now until HL and aPTT correlate.  MD requests heparin infusion to be started at the last time Xarelto was taken.   Baseline labs on 3/20: aPTT 41 sec and HL > 3.60 units/ml   3/21 @1947  APTT 133 supratherapeutic; HL @1947  0.66 therapeutic. Levels are not correlating.   Goal of Therapy:  Heparin level 0.3-0.7 units/ml aPTT 66-102 seconds Monitor platelets by anticoagulation protocol: Yes   Plan:  Baseline labs have been ordered  Heparin DW: 73 kg Will reduce heparin infusion by 2 units/kg/hr- will order 1000 units/hr and recheck aPTT in 6 hours after rate change nurse aware of changes. Check aPTT level in 6 hours and daily while on heparin, per protocol Continue to monitor H&H and platelets   Niajah Sipos R Aryssa Rosamond 07/24/2019,8:02 PM

## 2019-07-25 ENCOUNTER — Encounter
Admission: EM | Disposition: A | Discharge status: Home Health | Payer: Self-pay | Source: Home / Self Care | Attending: Internal Medicine

## 2019-07-25 ENCOUNTER — Inpatient Hospital Stay
Admit: 2019-07-25 | Discharge: 2019-07-25 | Disposition: A | Discharge status: Home / Self Care | Payer: Medicare Other | Attending: Internal Medicine | Admitting: Internal Medicine

## 2019-07-25 DIAGNOSIS — D735 Infarction of spleen: Principal | ICD-10-CM

## 2019-07-25 DIAGNOSIS — A419 Sepsis, unspecified organism: Secondary | ICD-10-CM

## 2019-07-25 HISTORY — PX: TEE WITHOUT CARDIOVERSION: SHX5443

## 2019-07-25 LAB — GLUCOSE, CAPILLARY
Glucose-Capillary: 142 mg/dL — ABNORMAL HIGH (ref 70–99)
Glucose-Capillary: 146 mg/dL — ABNORMAL HIGH (ref 70–99)
Glucose-Capillary: 150 mg/dL — ABNORMAL HIGH (ref 70–99)
Glucose-Capillary: 157 mg/dL — ABNORMAL HIGH (ref 70–99)
Glucose-Capillary: 169 mg/dL — ABNORMAL HIGH (ref 70–99)
Glucose-Capillary: 200 mg/dL — ABNORMAL HIGH (ref 70–99)

## 2019-07-25 LAB — APTT
aPTT: 91 seconds — ABNORMAL HIGH (ref 24–36)
aPTT: 88 seconds — ABNORMAL HIGH (ref 24–36)

## 2019-07-25 LAB — BASIC METABOLIC PANEL
Anion gap: 5 (ref 5–15)
BUN: 12 mg/dL (ref 8–23)
CO2: 22 mmol/L (ref 22–32)
Calcium: 9.6 mg/dL (ref 8.9–10.3)
Chloride: 110 mmol/L (ref 98–111)
Creatinine, Ser: 0.46 mg/dL (ref 0.44–1.00)
GFR calc Af Amer: >60 mL/min (ref >60)
GFR calc non Af Amer: >60 mL/min (ref >60)
Glucose, Bld: 175 mg/dL — ABNORMAL HIGH (ref 70–99)
Potassium: 3.7 mmol/L (ref 3.5–5.1)
Sodium: 137 mmol/L (ref 135–145)

## 2019-07-25 LAB — CBC
HCT: 28.1 % — ABNORMAL LOW (ref 36.0–46.0)
Hemoglobin: 7.8 g/dL — ABNORMAL LOW (ref 12.0–15.0)
MCH: 21.3 pg — ABNORMAL LOW (ref 26.0–34.0)
MCHC: 27.8 g/dL — ABNORMAL LOW (ref 30.0–36.0)
MCV: 76.6 fL — ABNORMAL LOW (ref 80.0–100.0)
Platelets: 184 10*3/uL (ref 150–400)
RBC: 3.67 MIL/uL — ABNORMAL LOW (ref 3.87–5.11)
RDW: 19.9 % — ABNORMAL HIGH (ref 11.5–15.5)
WBC: 13.5 10*3/uL — ABNORMAL HIGH (ref 4.0–10.5)
nRBC: 0 % (ref 0.0–0.2)

## 2019-07-25 SURGERY — ECHOCARDIOGRAM, TRANSESOPHAGEAL
Anesthesia: Moderate Sedation

## 2019-07-25 MED ORDER — INSULIN ASPART 100 UNIT/ML ~~LOC~~ SOLN
0.0000 [IU] | Freq: Three times a day (TID) | SUBCUTANEOUS | Status: DC
  Administered 2019-07-26 (×3): 2 [IU] via SUBCUTANEOUS
  Administered 2019-07-27: 3 [IU] via SUBCUTANEOUS
  Administered 2019-07-27: 2 [IU] via SUBCUTANEOUS
  Filled 2019-07-25 (×5): qty 1

## 2019-07-25 MED ORDER — BUTAMBEN-TETRACAINE-BENZOCAINE 2-2-14 % EX AERO
INHALATION_SPRAY | CUTANEOUS | Status: AC
  Administered 2019-07-25: 2
  Filled 2019-07-25: qty 5

## 2019-07-25 MED ORDER — SODIUM CHLORIDE FLUSH 0.9 % IV SOLN
INTRAVENOUS | Status: AC
  Filled 2019-07-25: qty 10

## 2019-07-25 MED ORDER — SODIUM CHLORIDE 0.9 % IV SOLN: INTRAVENOUS | Status: DC

## 2019-07-25 MED ORDER — FENTANYL CITRATE (PF) 100 MCG/2ML IJ SOLN
INTRAMUSCULAR | Status: AC
  Filled 2019-07-25: qty 2

## 2019-07-25 MED ORDER — INSULIN ASPART 100 UNIT/ML ~~LOC~~ SOLN: 0.0000 [IU] | Freq: Every day | SUBCUTANEOUS | Status: DC

## 2019-07-25 MED ORDER — FENTANYL CITRATE (PF) 100 MCG/2ML IJ SOLN
INTRAMUSCULAR | Status: AC | PRN
  Administered 2019-07-25: 25 ug via INTRAVENOUS
  Administered 2019-07-25: 50 ug via INTRAVENOUS

## 2019-07-25 MED ORDER — PHENOL 1.4 % MT LIQD
1.0000 | OROMUCOSAL | Status: DC | PRN
  Filled 2019-07-25: qty 177

## 2019-07-25 MED ORDER — MIDAZOLAM HCL 2 MG/2ML IJ SOLN
INTRAMUSCULAR | Status: AC | PRN
  Administered 2019-07-25 (×2): 1 mg via INTRAVENOUS

## 2019-07-25 MED ORDER — MIDAZOLAM HCL 5 MG/5ML IJ SOLN
INTRAMUSCULAR | Status: AC
  Filled 2019-07-25: qty 5

## 2019-07-25 MED ORDER — LIDOCAINE VISCOUS HCL 2 % MT SOLN
OROMUCOSAL | Status: AC
  Administered 2019-07-25: 15 mL
  Filled 2019-07-25: qty 15

## 2019-07-25 NOTE — Consult Note (Signed)
ANTICOAGULATION CONSULT NOTE   Pharmacy Consult for Heparin  Indication: DVT  Patient Measurements: Height: 5' (152.4 cm) Weight: 160 lb (72.6 kg) IBW/kg (Calculated) : 45.5 Heparin Dosing Weight: 73 kg  Vital Signs: Temp: 98.4 F (36.9 C) (03/22 1135) Temp Source: Oral (03/22 1135) BP: 112/49 (03/22 1135) Pulse Rate: 95 (03/22 1135)  Labs: Recent Labs    07/23/19 1317 07/23/19 1317 07/23/19 1817 07/23/19 1817 07/24/19 0809 07/24/19 1856 07/25/19 0233 07/25/19 1034  HGB 9.5*   < >  --   --  8.1*  --  7.8*  --   HCT 34.5*  --   --   --  29.0*  --  28.1*  --   PLT 277  --   --   --  209  --  184  --   APTT  --   --  41*   < >  --  133* 91* 88*  LABPROT  --   --   --   --  15.7*  --   --   --   INR  --   --   --   --  1.3*  --   --   --   HEPARINUNFRC  --   --  >3.60*  --   --  0.66  --   --   CREATININE 0.64  --   --   --  0.54  --  0.46  --    < > = values in this interval not displayed.    Estimated Creatinine Clearance: 47.4 mL/min (by C-G formula based on SCr of 0.46 mg/dL).   Medications:  Xarelto 20 mg - last dose 07/23/19 @ 10 AM   Assessment: Pharmacy has been consulted for heparin dosing in a patient taking Xarelto for suspected DVT. Per chart review, she has been on Xarelto since 2004 but she is unsure of why.  Dr. Kurtis Bushman spoke to the patient's daughter who believes her mother had multiple blood clots and one in the leg as well as a family history of DVT. At this time, Xarelto is being held and heparin started d/t Splenic multiple infarcts.  Goal of Therapy:  Heparin level 0.3-0.7 units/ml aPTT 66-102 seconds Monitor platelets by anticoagulation protocol: Yes   Plan:   This is the second consecutive therapeutic aPTT: continue heparin infusion at 1000 units/hr  recheck aPTT along with anti-Xa level with 3/22 am labs  Continue to monitor H&H and platelets: CBC in am   Dallie Piles 07/25/2019,2:56 PM

## 2019-07-25 NOTE — Consult Note (Signed)
[place] for Heparin  Indication: DVT  Allergies  Allergen Reactions  . Ciprofloxacin Hives  . Codeine Other (See Comments)    HALLUCINATIONS  . Tape     Rash and skin irritation/ paper tape and tegaderm OK  . Ciprocinonide [Fluocinolone] Other (See Comments)    unknown  . Amoxicillin Other (See Comments)    Upset stomach Has patient had a PCN reaction causing immediate rash, facial/tongue/throat swelling, SOB or lightheadedness with hypotension: No Has patient had a PCN reaction causing severe rash involving mucus membranes or skin necrosis: No Has patient had a PCN reaction that required hospitalization: No Has patient had a PCN reaction occurring within the last 10 years: Yes If all of the above answers are "NO", then may proceed with Cephalosporin use.;  . Cefuroxime Other (See Comments)    OK INTRACAMERALLY PER DR WLP, upset stomach  . Lipitor [Atorvastatin] Other (See Comments)    unknown    Patient Measurements: Height: 5' (152.4 cm) Weight: 160 lb 15 oz (73 kg) IBW/kg (Calculated) : 45.5 Heparin Dosing Weight: 73 kg  Vital Signs: Temp: 98.5 F (36.9 C) (03/21 1959) Temp Source: Oral (03/21 1959) BP: 164/118 (03/22 0018) Pulse Rate: 102 (03/22 0018)  Labs: Recent Labs    07/23/19 1317 07/23/19 1317 07/23/19 1817 07/24/19 0809 07/24/19 1856 07/25/19 0233  HGB 9.5*   < >  --  8.1*  --  7.8*  HCT 34.5*  --   --  29.0*  --  28.1*  PLT 277  --   --  209  --  184  APTT  --   --  41*  --  133* 91*  LABPROT  --   --   --  15.7*  --   --   INR  --   --   --  1.3*  --   --   HEPARINUNFRC  --   --  >3.60*  --  0.66  --   CREATININE 0.64  --   --  0.54  --  0.46   < > = values in this interval not displayed.    Estimated Creatinine Clearance: 47.5 mL/min (by C-G formula based on SCr of 0.46 mg/dL).   Medications:  Xarelto 20 mg - last dose 07/23/19 @ 10 AM   Assessment: Pharmacy has been consulted for heparin dosing  in a patient taking Xarelto for suspected DVT. Per chart review, she has been on Xarelto since 2004 but she is unsure of why.  Dr. Kurtis Bushman spoke to the patient's daughter who believes her mother had multiple blood clots and one in the leg as well as a family history of DVT. At this time, Xarelto is being held and heparin started d/t Splenic multiple infarcts.   Will need to order baseline labs for aPTT and HL. CBC appropriate to start heparin. Will base levels on aPTT for now until HL and aPTT correlate.  MD requests heparin infusion to be started at the last time Xarelto was taken.   Baseline labs on 3/20: aPTT 41 sec and HL > 3.60 units/ml   3/21 @1947  APTT 133 supratherapeutic; HL @1947  0.66 therapeutic. Levels are not correlating.  3/22 @ 0233 aPTT 91, therapeutic x 1, CBC stable  Goal of Therapy:  Heparin level 0.3-0.7 units/ml aPTT 66-102 seconds Monitor platelets by anticoagulation protocol: Yes   Plan:  Will continue heparin infusion at 1000 units/hr and recheck aPTT in 8 hours to confirm  Continue  to monitor H&H and platelets   Hart Robinsons A 07/25/2019,3:40 AM

## 2019-07-25 NOTE — Progress Notes (Signed)
*  PRELIMINARY RESULTS* Echocardiogram Echocardiogram Transesophageal has been performed.  Julie Acosta 07/25/2019, 8:57 AM

## 2019-07-25 NOTE — Consult Note (Signed)
[place]  [phone] Fax:(336) [phone]  ID: Julie Acosta OB: Jul 11, 1933  MR#: [id number]  [id number]  Patient Care Team: [place] Pink, MD as PCP - General (Family Medicine) Bary Castilla, Forest Gleason, MD (General Surgery)  CHIEF COMPLAINT: Splenic infarct  INTERVAL HISTORY: Patient is an 86 year old female with a past medical history significant for breast cancer and a distant history of osteosarcoma who recently presents to the emergency room with worsening left flank pain.  Subsequent work-up revealed multiple splenic infarcts of unknown etiology.  She feels improved since admission.  She has no neurologic complaints.  She denies any recent illnesses.  She has documented fever likely secondary to her infarct.  She has no chest pain, shortness of breath, cough, or hemoptysis.  She denies any nausea, vomiting, constipation, or diarrhea.  She has no melena or hematochezia.  She has no urinary complaints.  Patient otherwise feels well and offers no further specific complaints today.  REVIEW OF SYSTEMS:   Review of Systems  Constitutional: Negative.  Negative for fever, malaise/fatigue and weight loss.  Respiratory: Negative.  Negative for cough, hemoptysis and shortness of breath.   Cardiovascular: Negative.  Negative for chest pain and leg swelling.  Gastrointestinal: Negative.  Negative for abdominal pain, blood in stool and melena.  Genitourinary: Positive for flank pain. Negative for dysuria.  Musculoskeletal: Negative for back pain.  Skin: Negative.  Negative for rash.  Neurological: Negative.  Negative for dizziness, focal weakness, weakness and headaches.  Psychiatric/Behavioral: Negative.  The patient is not nervous/anxious.     As per HPI. Otherwise, a complete review of systems is negative.  PAST MEDICAL HISTORY: Past Medical History:  Diagnosis Date   Anxiety    Benign breast lumps    Blood clotting disorder (HCC)    Bone cancer  ([place])    head of femur, left leg ... amputation at age 8   Breast cancer of upper-outer quadrant of left female breast ([place]) 07/27/2017   11 mm invasive mammary carcinoma, ER 90%, PR 51-90%, negative margins.  Oncotype recurrence score: 1.  DCIS, margin less than 0.5 mm.  Negative sentinel node.  Accelerated partial breast radiation.   Cervicalgia    Chronic kidney disease    kidney stones   Colon cancer [facility])    patient unaware of this   Complication of anesthesia    mood alteration / not sure if d/t pain medicine or anesthesia as she passed out    Cough    NASAL DRIP / SNEEZING / SORE THROAT MOSTLY CONSTANT   Depression    Diabetes ([place])    Diverticulitis    Dizzy    GERD (gastroesophageal reflux disease)    H/O blood clots    arm and leg   HBP (high blood pressure)    Hematuria    gross   Hemorrhoids    HLD (hyperlipidemia)    Muscle pain    Osteoarthritis    Osteoporosis    Panic disorder    Personal history of radiation therapy    PND (post-nasal drip)    CHRONIC WITH SORE THROAT AND SNEEZING   Reflux    Skin cancer    nose   Swelling    Tremor    Tremors of nervous system     PAST SURGICAL HISTORY: Past Surgical History:  Procedure Laterality Date   APPENDECTOMY  1962   BREAST BIOPSY Right 2006?   benign   BREAST BIOPSY Left 2019   invasive mammary carcinoma  BREAST CYST EXCISION Left 07/27/2017   Procedure: SKIN CYST EXCISED;  Surgeon: Robert Bellow, MD;  Location: [facility] ORS;  Service: General;  Laterality: Left;   BREAST LUMPECTOMY Left 07/2017   invasive mammary, DCIS  mammosite   BREAST SURGERY Right 2019   CARPAL TUNNEL RELEASE Right    CATARACT EXTRACTION W/PHACO Left 11/11/2016   Procedure: CATARACT EXTRACTION PHACO AND INTRAOCULAR LENS PLACEMENT ([place]);  Surgeon: Birder Robson, MD;  Location: [facility] ORS;  Service: Ophthalmology;  Laterality: Left;  [place] 01:07.9 AP% 19.2 CDE 13.02 Fluid pack lot # [id number]    CATARACT EXTRACTION W/PHACO Right 12/09/2016   Procedure: CATARACT EXTRACTION PHACO AND INTRAOCULAR LENS PLACEMENT (I[facility]);  Surgeon: Birder Robson, MD;  Location: [facility] ORS;  Service: Ophthalmology;  Laterality: Right;  [place] 00:49 AP% 21.2 CDE 10.39 Fluid pack lot # [id number]   CHOLECYSTECTOMY     COLONOSCOPY WITH PROPOFOL N/A 11/22/2014   Procedure: COLONOSCOPY WITH PROPOFOL;  Surgeon: Manya Silvas, MD;  Location: [facility] ENDOSCOPY;  Service: Endoscopy;  Laterality: N/A;   CYSTOSCOPY WITH STENT PLACEMENT Bilateral 11/27/2018   Procedure: CYSTOSCOPY WITH STENT PLACEMENT;  Surgeon: Festus Aloe, MD;  Location: [facility] ORS;  Service: Urology;  Laterality: Bilateral;   CYSTOSCOPY/URETEROSCOPY/HOLMIUM LASER/STENT PLACEMENT Bilateral 12/17/2018   Procedure: CYSTOSCOPY/URETEROSCOPY/HOLMIUM LASER/STENT Exchange;  Surgeon: Billey Co, MD;  Location: [facility] ORS;  Service: Urology;  Laterality: Bilateral;   ESOPHAGOGASTRODUODENOSCOPY  11/22/2014   Procedure: ESOPHAGOGASTRODUODENOSCOPY (EGD);  Surgeon: Manya Silvas, MD;  Location: [facility] ENDOSCOPY;  Service: Endoscopy;;   ESOPHAGOGASTRODUODENOSCOPY (EGD) WITH PROPOFOL N/A 03/05/2017   Procedure: ESOPHAGOGASTRODUODENOSCOPY (EGD) WITH PROPOFOL;  Surgeon: Lin Landsman, MD;  Location: [facility] ENDOSCOPY;  Service: Gastroenterology;  Laterality: N/A;   EXTRACORPOREAL SHOCK WAVE LITHOTRIPSY Right 03/04/2018   Procedure: EXTRACORPOREAL SHOCK WAVE LITHOTRIPSY (ESWL);  Surgeon: Billey Co, MD;  Location: [facility] ORS;  Service: Urology;  Laterality: Right;   EYE SURGERY Bilateral 2012   cataract extraction with iol   GALLBLADDER SURGERY  1968   hemi pelvectomy     left side at age 49   HEMIPELVIC Left [place] Left    AMPUTATION d/t cancer at 86 years old   MASTECTOMY, PARTIAL Left 07/27/2017   Procedure: MASTECTOMY PARTIAL;  Surgeon: Robert Bellow, MD;  Location: [facility] ORS;  Service: General;  Laterality: Left;   MOUTH  SURGERY  2019   teeth pulled with a bridge insertion.   fell out 12/16/18   OVARY SURGERY Right    cyst removed   SAVORY DILATION  11/22/2014   Procedure: SAVORY DILATION;  Surgeon: Manya Silvas, MD;  Location: [facility] ENDOSCOPY;  Service: Endoscopy;;   SENTINEL NODE BIOPSY Left 07/27/2017   Procedure: SENTINEL NODE BIOPSY;  Surgeon: Robert Bellow, MD;  Location: [facility] ORS;  Service: General;  Laterality: Left;   SKIN CANCER EXCISION     nose and arm and face   TONSILLECTOMY      FAMILY HISTORY: Family History  Problem Relation Age of Onset   Breast cancer Paternal Aunt    Ovarian cancer Sister    Prostate cancer Father    Stroke Mother    Kidney cancer Neg Hx    Bladder Cancer Neg Hx     ADVANCED DIRECTIVES (Y/N):  @ADVDIR @  HEALTH MAINTENANCE: Social History   Tobacco Use   Smoking status: Never Smoker   Smokeless tobacco: Never Used  Substance Use Topics   Alcohol use: No   Drug use: No  Colonoscopy:  PAP:  Bone density:  Lipid panel:  Allergies  Allergen Reactions   Ciprofloxacin Hives   Codeine Other (See Comments)    HALLUCINATIONS   Tape     Rash and skin irritation/ paper tape and tegaderm OK   Ciprocinonide [Fluocinolone] Other (See Comments)    unknown   Amoxicillin Other (See Comments)    Upset stomach Has patient had a PCN reaction causing immediate rash, facial/tongue/throat swelling, SOB or lightheadedness with hypotension: No Has patient had a PCN reaction causing severe rash involving mucus membranes or skin necrosis: No Has patient had a PCN reaction that required hospitalization: No Has patient had a PCN reaction occurring within the last 10 years: Yes If all of the above answers are "NO", then may proceed with Cephalosporin use.;   Cefuroxime Other (See Comments)    OK INTRACAMERALLY PER DR WLP, upset stomach   Lipitor [Atorvastatin] Other (See Comments)    unknown    Current Facility-Administered  Medications  Medication Dose Route Frequency Provider Last Rate Last Admin   0.45 % sodium chloride infusion   Intravenous Continuous Nolberto Hanlon, MD 100 mL/hr at 07/25/19 1502 Rate Verify at 07/25/19 1502   acetaminophen (TYLENOL) tablet 650 mg  650 mg Oral Q6H PRN Lang Snow, NP   650 mg at 07/25/19 1511   ceFEPIme (MAXIPIME) 2 g in sodium chloride 0.9 % 100 mL IVPB  2 g Intravenous Q12H Lu Duffel, RPH   Stopped at 07/25/19 1038   fenofibrate tablet 160 mg  160 mg Oral Daily Nolberto Hanlon, MD   160 mg at 07/25/19 1011   fentaNYL (SUBLIMAZE) 100 MCG/2ML injection            heparin ADULT infusion 100 units/mL (25000 units/239mL sodium chloride 0.45%)  1,000 Units/hr Intravenous Continuous Rowland Lathe, RPH 10 mL/hr at 07/25/19 0638 1,000 Units/hr at 07/25/19 [id number]   insulin aspart (novoLOG) injection 0-15 Units  0-15 Units Subcutaneous Q4H Nolberto Hanlon, MD   2 Units at 07/25/19 1600   letrozole [facility]) tablet 2.5 mg  2.5 mg Oral Daily Nolberto Hanlon, MD   2.5 mg at 07/25/19 1011   LORazepam (ATIVAN) tablet 0.25 mg  0.25 mg Oral Q6H PRN Nolberto Hanlon, MD   0.25 mg at 07/24/19 1107   metoprolol succinate (TOPROL-XL) 24 hr tablet 12.5 mg  12.5 mg Oral QHS Nolberto Hanlon, MD   12.5 mg at 07/24/19 2132   midazolam (VERSED) 5 MG/5ML injection            multivitamin with minerals tablet 1 tablet  1 tablet Oral Daily Nolberto Hanlon, MD   1 tablet at 07/24/19 1012   pantoprazole (PROTONIX) EC tablet 40 mg  40 mg Oral Daily Nolberto Hanlon, MD   40 mg at 07/25/19 1011   PARoxetine (PAXIL) tablet 40 mg  40 mg Oral Daily Nolberto Hanlon, MD   40 mg at 07/25/19 1011   phenol (CHLORASEPTIC) mouth spray 1 spray  1 spray Mouth/Throat PRN Nolberto Hanlon, MD       raloxifene (EVISTA) tablet 60 mg  60 mg Oral Daily Nolberto Hanlon, MD   60 mg at 07/25/19 1011   sodium chloride flush (NS) 0.9 % injection 3 mL  3 mL Intravenous Q12H Nolberto Hanlon, MD   3 mL at 07/25/19 1004   sodium  chloride flush 0.9 % injection            vancomycin (VANCOCIN) IVPB 1000 mg/200 mL premix  1,000 mg  Intravenous Q36H Rowland Lathe, RPH   Stopped at 07/25/19 0747    OBJECTIVE: Vitals:   07/25/19 1135 07/25/19 1613  BP: (!) 112/49   Pulse: 95 99  Resp: 18   Temp: 98.4 F (36.9 C)   SpO2: 95% 93%     Body mass index is 31.25 kg/m.    ECOG FS:0 - Asymptomatic  General: Well-developed, well-nourished, no acute distress. Eyes: Pink conjunctiva, anicteric sclera. HEENT: Normocephalic, moist mucous membranes. Lungs: No audible wheezing or coughing. Heart: Regular rate and rhythm. Abdomen: Soft, nontender, no obvious distention. Musculoskeletal: No edema, cyanosis, or clubbing.  Left leg amputation. Neuro: Alert, answering all questions appropriately. Cranial nerves grossly intact. Skin: No rashes or petechiae noted. Psych: Normal affect. Lymphatics: No cervical, calvicular, axillary or inguinal LAD.   LAB RESULTS:  Lab Results  Component Value Date   NA 137 07/25/2019   K 3.7 07/25/2019   CL 110 07/25/2019   CO2 22 07/25/2019   GLUCOSE 175 (H) 07/25/2019   BUN 12 07/25/2019   CREATININE 0.46 07/25/2019   CALCIUM 9.6 07/25/2019   PROT 5.6 (L) 07/24/2019   ALBUMIN 2.8 (L) 07/24/2019   AST 17 07/24/2019   ALT 9 07/24/2019   ALKPHOS 35 (L) 07/24/2019   BILITOT 0.8 07/24/2019   GFRNONAA >60 07/25/2019   GFRAA >60 07/25/2019    Lab Results  Component Value Date   WBC 13.5 (H) 07/25/2019   NEUTROABS 5.1 07/19/2019   HGB 7.8 (L) 07/25/2019   HCT 28.1 (L) 07/25/2019   MCV 76.6 (L) 07/25/2019   PLT 184 07/25/2019     STUDIES: Abdomen 1 view (KUB)  Result Date: 07/14/2019 CLINICAL DATA:  Kidney stones, history of nephrolithiasis. EXAM: ABDOMEN - 1 VIEW COMPARISON:  03/18/2018 FINDINGS: Nephrolithiasis on the right the least 4-5 calculi noted largest approximately 6 mm. Stool and gas overlie the left renal contour. Calcific density projecting over the medial aspect  of the renal contour approximately 5 mm, potentially in the left renal pelvis. Lung bases are clear. Signs of left hemipelvectomy and spinal degenerative change. Scattered loops of gas-filled small bowel with gas and stool throughout the colon. No signs of obstruction. IMPRESSION: 1. Multiple right-sided renal calculi, largest approximately 5-6 mm. 2. Possible 5 mm left renal calculus. 3. Nonobstructive bowel gas pattern. 4. Changes of left hemipelvectomy. Electronically Signed   By: Zetta Bills M.D.   On: 07/14/2019 08:36   CT Chest W Contrast  Result Date: 07/23/2019 CLINICAL DATA:  Follow-up recent splenic infarcts EXAM: CT CHEST, ABDOMEN, AND PELVIS WITH CONTRAST TECHNIQUE: Multidetector CT imaging of the chest, abdomen and pelvis was performed following the standard protocol during bolus administration of intravenous contrast. CONTRAST:  111mL OMNIPAQUE IOHEXOL 300 MG/ML  SOLN COMPARISON:  CT from the same day. FINDINGS: CT CHEST FINDINGS Cardiovascular: The heart size is mildly enlarged. No significant pericardial effusion. Atherosclerotic changes are noted of the thoracic aorta without evidence for an aneurysm or dissection. The main pulmonary artery is mildly dilated. There is no large centrally located pulmonary embolism. Mediastinum/Nodes: --No mediastinal or hilar lymphadenopathy. --No axillary lymphadenopathy. --No supraclavicular lymphadenopathy. --Normal thyroid gland. --there is a right-sided thyroid nodule measuring approximately 2.8 cm. This appears relatively stable since 2015. Lungs/Pleura: There is a pulmonary nodule in the right upper lobe measuring approximately 5 mm (axial series 4, image 39). This is essentially stable since 2015. There are multiple small subcentimeter pulmonary nodules in the right middle lobe and right lower lobe that are essentially stable  since prior study. There is a stable 1 cm pulmonary nodule in the left lower lobe there is some atelectasis at the lung bases.  There is no significant pleural effusion. There is no pneumothorax. Musculoskeletal: No chest wall abnormality. No acute or significant osseous findings. CT ABDOMEN PELVIS FINDINGS Hepatobiliary: There is decreased hepatic attenuation suggestive of hepatic steatosis. Status post cholecystectomy.There is no biliary ductal dilation. Pancreas: Normal contours without ductal dilatation. No peripancreatic fluid collection. Spleen: There are multiple wedge-shaped hypoattenuating defects in the spleen, likely involving approximately 40-50% of the splenic parenchyma. There is no perisplenic hematoma. No evidence for active extravasation. Adrenals/Urinary Tract: --Adrenal glands: No adrenal hemorrhage. --Right kidney/ureter: Multiple nonobstructing stones are noted involving the right kidney. --Left kidney/ureter: There are nonobstructing stones involving the left kidney. --Urinary bladder: Unremarkable. Stomach/Bowel: --Stomach/Duodenum: No hiatal hernia or other gastric abnormality. Normal duodenal course and caliber. --Small bowel: No dilatation or inflammation. --Colon: Rectosigmoid diverticulosis without acute inflammation. --Appendix: Not visualized. No right lower quadrant inflammation or free fluid. Vascular/Lymphatic: Atherosclerotic calcification is present within the non-aneurysmal abdominal aorta, without hemodynamically significant stenosis. The splenic artery is grossly patent. --No retroperitoneal lymphadenopathy. --No mesenteric lymphadenopathy. --No pelvic or inguinal lymphadenopathy. Reproductive: There is a 2.1 cm cystic structure involving the left ovary. This is relatively stable since 2020 and is consistent with a benign process. Multiple calcified fibroids are noted. Other: No ascites or free air. The abdominal wall is normal. Musculoskeletal. 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 IMPRESSION: 1. Multiple wedge-shaped hypoattenuating defects in the spleen, likely  involving approximately 40-50% of the splenic parenchyma. 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 The splenic artery is grossly patent. 2. There is scattered bilateral pulmonary nodules, all of which appear relatively stable since 2015. No further follow-up is required. 3. Mild cardiomegaly. 4. Bilateral nonobstructing nephrolithiasis. 5. Rectosigmoid diverticulosis. 6. There is a 2.8 cm right-sided thyroid nodule which is essentially stable since 2015. No follow-up recommended unless clinically warranted (ref: J Am Coll Radiol. 2015 Feb;12(2): 143-50). 7. Aortic Atherosclerosis (ICD10-I70.0). Electronically Signed   By: Constance Holster M.D.   On: 07/23/2019 15:39   CT ABDOMEN PELVIS W CONTRAST  Result Date: 07/23/2019 CLINICAL DATA:  Follow-up recent splenic infarcts EXAM: CT CHEST, ABDOMEN, AND PELVIS WITH CONTRAST TECHNIQUE: Multidetector CT imaging of the chest, abdomen and pelvis was performed following the standard protocol during bolus administration of intravenous contrast. CONTRAST:  116mL OMNIPAQUE IOHEXOL 300 MG/ML  SOLN COMPARISON:  CT from the same day. FINDINGS: CT CHEST FINDINGS Cardiovascular: The heart size is mildly enlarged. No significant pericardial effusion. Atherosclerotic changes are noted of the thoracic aorta without evidence for an aneurysm or dissection. The main pulmonary artery is mildly dilated. There is no large centrally located pulmonary embolism. Mediastinum/Nodes: --No mediastinal or hilar lymphadenopathy. --No axillary lymphadenopathy. --No supraclavicular lymphadenopathy. --Normal thyroid gland. --there is a right-sided thyroid nodule measuring approximately 2.8 cm. This appears relatively stable since 2015. Lungs/Pleura: There is a pulmonary nodule in the right upper lobe measuring approximately 5 mm (axial series 4, image 39). This is essentially stable since 2015. There are multiple small subcentimeter pulmonary nodules in the right  middle lobe and right lower lobe that are essentially stable since prior study. There is a stable 1 cm pulmonary nodule in the left lower lobe there is some atelectasis at the lung bases. There is no significant pleural effusion. There is no pneumothorax. Musculoskeletal: No chest wall abnormality. No acute  or significant osseous findings. CT ABDOMEN PELVIS FINDINGS Hepatobiliary: There is decreased hepatic attenuation suggestive of hepatic steatosis. Status post cholecystectomy.There is no biliary ductal dilation. Pancreas: Normal contours without ductal dilatation. No peripancreatic fluid collection. Spleen: There are multiple wedge-shaped hypoattenuating defects in the spleen, likely involving approximately 40-50% of the splenic parenchyma. There is no perisplenic hematoma. No evidence for active extravasation. Adrenals/Urinary Tract: --Adrenal glands: No adrenal hemorrhage. --Right kidney/ureter: Multiple nonobstructing stones are noted involving the right kidney. --Left kidney/ureter: There are nonobstructing stones involving the left kidney. --Urinary bladder: Unremarkable. Stomach/Bowel: --Stomach/Duodenum: No hiatal hernia or other gastric abnormality. Normal duodenal course and caliber. --Small bowel: No dilatation or inflammation. --Colon: Rectosigmoid diverticulosis without acute inflammation. --Appendix: Not visualized. No right lower quadrant inflammation or free fluid. Vascular/Lymphatic: Atherosclerotic calcification is present within the non-aneurysmal abdominal aorta, without hemodynamically significant stenosis. The splenic artery is grossly patent. --No retroperitoneal lymphadenopathy. --No mesenteric lymphadenopathy. --No pelvic or inguinal lymphadenopathy. Reproductive: There is a 2.1 cm cystic structure involving the left ovary. This is relatively stable since 2020 and is consistent with a benign process. Multiple calcified fibroids are noted. Other: No ascites or free air. The abdominal wall  is normal. Musculoskeletal. 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 IMPRESSION: 1. Multiple wedge-shaped hypoattenuating defects in the spleen, likely involving approximately 40-50% of the splenic parenchyma. 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 The splenic artery is grossly patent. 2. There is scattered bilateral pulmonary nodules, all of which appear relatively stable since 2015. No further follow-up is required. 3. Mild cardiomegaly. 4. Bilateral nonobstructing nephrolithiasis. 5. Rectosigmoid diverticulosis. 6. There is a 2.8 cm right-sided thyroid nodule which is essentially stable since 2015. No follow-up recommended unless clinically warranted (ref: J Am Coll Radiol. 2015 Feb;12(2): 143-50). 7. Aortic Atherosclerosis (ICD10-I70.0). Electronically Signed   By: Constance Holster M.D.   On: 07/23/2019 15:39   ECHOCARDIOGRAM COMPLETE  Result Date: 07/24/2019    ECHOCARDIOGRAM REPORT   Patient Name:   Julie Acosta Date of Exam: 07/24/2019 Medical Rec #:  [id number]       Height:       60.0 in Accession #:    [id number]      Weight:       161.0 lb Date of Birth:  09-07-1933      BSA:          1.702 m Patient Age:    40 years        BP:           138/56 mmHg Patient Gender: F               HR:           119 bpm. Exam Location:  [facility] Procedure: 2D Echo Indications:     FEVER 780.6/R50.9  History:         Patient has no prior history of Echocardiogram examinations.                  Risk Factors:Diabetes, Hypertension and Dyslipidemia.  Sonographer:     Avanell Shackleton Referring Phys:  [id number] Nolberto Hanlon Diagnosing Phys: Yolonda Kida MD IMPRESSIONS  1. Left ventricular ejection fraction, by estimation, is 70 to 75%. The left ventricle has hyperdynamic function. The left ventricle has no regional wall motion abnormalities. There is mild concentric left ventricular hypertrophy. Left ventricular diastolic parameters are consistent with  Grade I diastolic dysfunction (impaired relaxation).  2.  Right ventricular systolic function is normal. The right ventricular size is normal. There is moderately elevated pulmonary artery systolic pressure.  3. The mitral valve is grossly normal. Trivial mitral valve regurgitation.  4. The aortic valve is abnormal. Aortic valve regurgitation is not visualized. FINDINGS  Left Ventricle: Left ventricular ejection fraction, by estimation, is 70 to 75%. The left ventricle has hyperdynamic function. The left ventricle has no regional wall motion abnormalities. The left ventricular internal cavity size was normal in size. There is mild concentric left ventricular hypertrophy. Left ventricular diastolic parameters are consistent with Grade I diastolic dysfunction (impaired relaxation). Right Ventricle: The right ventricular size is normal. No increase in right ventricular wall thickness. Right ventricular systolic function is normal. There is moderately elevated pulmonary artery systolic pressure. The tricuspid regurgitant velocity is 3.28 m/s, and with an assumed right atrial pressure of 10 mmHg, the estimated right ventricular systolic pressure is 123XX123 mmHg. Left Atrium: Left atrial size was normal in size. Right Atrium: Right atrial size was normal in size. Pericardium: There is no evidence of pericardial effusion. Mitral Valve: The mitral valve is grossly normal. Trivial mitral valve regurgitation. Tricuspid Valve: The tricuspid valve is normal in structure. Tricuspid valve regurgitation is mild. Aortic Valve: The aortic valve is abnormal. Aortic valve regurgitation is not visualized. Pulmonic Valve: The pulmonic valve was normal in structure. Pulmonic valve regurgitation is not visualized. Aorta: The aortic root is normal in size and structure. IAS/Shunts: No atrial level shunt detected by color flow Doppler.  LEFT VENTRICLE PLAX 2D LVIDd:         4.54 cm LVIDs:         2.32 cm LV PW:         0.82 cm LV IVS:         0.91 cm LVOT diam:     1.90 cm LVOT Area:     2.84 cm  LEFT ATRIUM           Index LA diam:      4.80 cm 2.82 cm/m LA Vol (A4C): 77.5 ml 45.53 ml/m   AORTA Ao Root diam: 2.80 cm MITRAL VALVE                TRICUSPID VALVE MV Area (PHT): 2.17 cm     TR Peak grad:   43.0 mmHg MV Decel Time: 350 msec     TR Vmax:        328.00 cm/s MV E velocity: 110.00 cm/s MV A velocity: 176.00 cm/s  SHUNTS MV E/A ratio:  0.62         Systemic Diam: 1.90 cm Yolonda Kida MD Electronically signed by Yolonda Kida MD Signature Date/Time: 07/24/2019/11:40:35 PM    Final    CT Renal Stone Study  Result Date: 07/23/2019 CLINICAL DATA:  Pt arrives via EMS from home ([facility]) after having L flank pain since Wed that radiates to abdomen and back- pt has a hx of kidney infection and states that this feels similar EXAM: CT ABDOMEN AND PELVIS WITHOUT CONTRAST TECHNIQUE: Multidetector CT imaging of the abdomen and pelvis was performed following the standard protocol without IV contrast. COMPARISON:  CT abdomen pelvis 11/25/2018 FINDINGS: The most lateral aspect of the left lateral abdominal wall is excluded from [place] of view. Lower chest: Bibasilar atelectasis. Solid and ground-glass nodules in the right lung base appear stable. There is a 1.2 cm nodule in the left lung base which appears mildly increased in size compared  to the prior study. Hepatobiliary: No focal liver abnormality is seen. No gallstones, gallbladder wall thickening, or biliary dilatation. Pancreas: Unremarkable. Spleen: New multiple areas of confluent hypoattenuation in the spleen. Spleen is normal in size. Adrenals/Urinary Tract: Adrenal glands are unremarkable. There are punctate calculi in the bilateral kidneys. No hydronephrosis. Stomach/Bowel: Stomach is within normal limits. No evidence of bowel wall thickening, distention, or inflammatory changes. Multiple colonic diverticula without evidence of diverticulitis. Vascular/Lymphatic:  Moderate aortoiliac atherosclerotic calcification without aneurysm. Vascular patency cannot be assessed in the absence of IV contrast. No lymphadenopathy. Reproductive: Fibroid uterus. Bilateral adnexa unremarkable. Other: No abdominal wall hernia or abnormality. No abdominopelvic ascites. Musculoskeletal: Left hemipelvectomy. Degenerative changes in the right sacroiliac joint. IMPRESSION: 1. New multiple areas of confluent hypoattenuation in the spleen are nonspecific but differential includes splenic infarcts and metastatic disease. Contrast-enhanced evaluation is recommended. 2. Bilateral punctate nephrolithiasis without hydronephrosis. 3. Colonic diverticulosis without evidence of diverticulitis. 4. There is a 1.2 cm nodule in the left lung base which appears mildly increased in size compared to the prior study. Nonemergent chest CT is recommended for further evaluation. Aortic Atherosclerosis (ICD10-I70.0). Electronically Signed   By: Audie Pinto M.D.   On: 07/23/2019 14:11   ECHO TEE  Result Date: 07/25/2019    TRANSESOPHOGEAL ECHO REPORT   Patient Name:   Julie Acosta Date of Exam: 07/25/2019 Medical Rec #:  [id number]       Height:       60.0 in Accession #:    [id number]      Weight:       160.9 lb Date of Birth:  04-23-1934      BSA:          1.702 m Patient Age:    56 years        BP:           97/39 mmHg Patient Gender: F               HR:           96 bpm. Exam Location:  [facility] Procedure: Transesophageal Echo, Color Doppler and Cardiac Doppler Indications:     SBE-subacute bacterial endocarditis 421.0  History:         Patient has prior history of Echocardiogram examinations, most                  recent 07/24/2019. High blood pressure.  Sonographer:     Sherrie Sport RDCS (AE) Referring Phys:  [place] Diagnosing Phys: Serafina Royals MD PROCEDURE: The transesophogeal probe was passed without difficulty through the esophogus of the patient. Sedation performed by performing  physician. The patient developed no complications during the procedure. IMPRESSIONS  1. Left ventricular ejection fraction, by estimation, is 55 to 60%. The left ventricle has normal function. The left ventricle has no regional wall motion abnormalities.  2. Right ventricular systolic function is normal. The right ventricular size is normal.  3. Left atrial size was mild to moderately dilated. No left atrial/left atrial appendage thrombus was detected.  4. The mitral valve is normal in structure. Mild to moderate mitral valve regurgitation.  5. The aortic valve is normal in structure. Aortic valve regurgitation is trivial.  6. There is mild (Grade II) plaque. FINDINGS  Left Ventricle: Left ventricular ejection fraction, by estimation, is 55 to 60%. The left ventricle has normal function. The left ventricle has no regional wall motion abnormalities. The left ventricular internal cavity size was normal in  size. There is  no left ventricular hypertrophy. Right Ventricle: The right ventricular size is normal. No increase in right ventricular wall thickness. Right ventricular systolic function is normal. Left Atrium: Left atrial size was mild to moderately dilated. No left atrial/left atrial appendage thrombus was detected. Right Atrium: Right atrial size was normal in size. Pericardium: There is no evidence of pericardial effusion. Mitral Valve: The mitral valve is normal in structure. Mild to moderate mitral valve regurgitation. There is no evidence of mitral valve vegetation. Tricuspid Valve: The tricuspid valve is normal in structure. Tricuspid valve regurgitation is mild. There is no evidence of tricuspid valve vegetation. Aortic Valve: The aortic valve is normal in structure. Aortic valve regurgitation is trivial. There is no evidence of aortic valve vegetation. Pulmonic Valve: The pulmonic valve was grossly normal. Pulmonic valve regurgitation is not visualized. Aorta: The aortic root and ascending aorta are  structurally normal, with no evidence of dilitation. There is mild (Grade II) plaque. IAS/Shunts: There is right bowing of the interatrial septum, suggestive of elevated left atrial pressure. No atrial level shunt detected by color flow Doppler. Serafina Royals MD Electronically signed by Serafina Royals MD Signature Date/Time: 07/25/2019/12:24:42 PM    Final     ASSESSMENT: Splenic infarction  PLAN:    1.  Splenic infarction: Unclear etiology.  Does not appear to be cardioembolic or related to underlying malignancy.  She has no report of trauma.  Patient does not appear septic.  Fevers and leukocytosis are likely secondary to infarction.  Patient on Eliquis at the time of infarction.  Will order hypercoagulable work-up for completeness.  Patient will need to remain on anticoagulation.  Recommend transitioning patient to Lovenox 1 mg/kg twice per day.  Continue Lovenox until patient is evaluated in the cancer center in 1 to 2 weeks at which time we will discuss other anticoagulation options. 2.  Breast cancer: Continue letrozole as prescribed.  Patient's most recent mammogram on June 21, 2019 was reported as BI-RADS 2. 3.  History of sarcoma: Patient underwent left hemipelvectomy in 1962.    Appreciate consult, will follow.   Lloyd Huger, MD   07/25/2019 4:31 PM

## 2019-07-25 NOTE — H&P (Signed)
PROGRESS NOTE    Julie Acosta  [id number] DOB: [birth year] DOA: 07/23/2019 PCP: [place] Pink, MD    Brief Narrative:  Julie Acosta is a 86 y.o. female with medical history significant of  anxiety, breast cancer, diabetes, osteopenia, left leg sarcoma, status post left hemipelvectomy and amputation with no evidence of recurrence presents to the emergency department for left flank pain. 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 Multiple wedge-shaped hypoattenuating defects in the spleen, likely involving approximately 40-50% of the splenic parenchyma. 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 The splenic artery is grossly patent. 2. There is scattered bilateral pulmonary nodules, all of which appear relatively stable since 2015. No further follow-up is required. 3. Mild cardiomegaly. 4. Bilateral nonobstructing nephrolithiasis. 5. Rectosigmoid diverticulosis. 6. There is a 2.8 cm right-sided thyroid nodule which is essentially stable since 2015. No follow-up recommended unless clinically warranted (ref: J Am Coll Radiol. 2015 Feb;12(2): 143-50). 7. Aortic Atherosclerosis (ICD10-I70.0).  Antimicrobials:       Subjective: S/p tee this am. She is sleepy , tries to open eyes when I speak to her but falls asleep again  Objective: Vitals:   07/25/19 0915 07/25/19 0930 07/25/19 1000 07/25/19 1135  BP: (!) 119/53 (!) 115/53 (!) 133/56 (!) 112/49  Pulse: 97 97 95 95  Resp: (!) 22 15 16 18   Temp:   98.6 F (37 C) 98.4 F (36.9 C)  TempSrc:   Oral Oral  SpO2: 94% 94% 97% 95%  Weight:      Height:        Intake/Output Summary (Last 24 hours) at 07/25/2019 1435 Last data filed at 07/25/2019 1228 Gross per 24 hour  Intake 1584.34 ml  Output 1850 ml  Net -265.66 ml   Filed Weights   07/24/19 1744 07/25/19 0755  Weight: 73 kg 72.6 kg     Examination:  General exam: Appears calm and comfortable , sleepy Respiratory system: Clear to auscultation. Respiratory effort normal. Cardiovascular system: S1 & S2 heard, RRR. No  murmurs, rubs, No pedal edema. Gastrointestinal system: Abdomen is nondistended, soft and nontender. Normal bowel sounds heard. Central nervous system: sleepy. Extremities: Right lower extremity no edema, left lower extremity amputated Skin: No rashes, lesions or ulcers Psychiatry:  Mood & affect appropriate in current setting.     Data Reviewed: I have personally reviewed following labs and imaging studies  CBC: Recent Labs  Lab 07/19/19 1027 07/23/19 1317 07/24/19 0809 07/25/19 0233  WBC 7.5 17.3* 14.2* 13.5*  NEUTROABS 5.1  --   --   --   HGB 9.4* 9.5* 8.1* 7.8*  HCT 33.4* 34.5* 29.0* 28.1*  MCV 74.9* 75.5* 75.1* 76.6*  PLT 371 277 209 [id number]   Basic Metabolic Panel: Recent Labs  Lab 07/19/19 1027 07/23/19 1317 07/24/19 0809 07/25/19 0233  NA 137 136 137 137  K 4.7 4.6 3.8 3.7  CL 108 105 108 110  CO2 20* 23 22 22   GLUCOSE 213* 241* 153* 175*  BUN 23 14 9 12   CREATININE 0.78 0.64 0.54 0.46  CALCIUM 10.1 10.7* 8.9 9.6   GFR: Estimated Creatinine Clearance: 47.4 mL/min (by C-G formula based on SCr of 0.46 mg/dL). Liver Function Tests: Recent Labs  Lab 07/23/19 1317 07/24/19 0809  AST 32 17  ALT 13 9  ALKPHOS 50 35*  BILITOT 0.6 0.8  PROT 7.3 5.6*  ALBUMIN 4.0 2.8*   Recent Labs  Lab 07/23/19 1317  LIPASE 40   No results for input(s): AMMONIA in the last 168 hours. Coagulation Profile: Recent Labs  Lab 07/24/19 0809  INR 1.3*   Cardiac Enzymes: No results for input(s): CKTOTAL, CKMB, CKMBINDEX, TROPONINI in the last 168 hours. BNP (last 3 results) No results for input(s): PROBNP in the last 8760 hours. HbA1C: Recent Labs    07/23/19 1324  HGBA1C 6.6*   CBG: Recent Labs  Lab 07/24/19 2103 07/25/19 0014 07/25/19 0430 07/25/19 0941 07/25/19 1136   GLUCAP 159* 150* 146* 169* 157*   Lipid Profile: No results for input(s): CHOL, HDL, LDLCALC, TRIG, CHOLHDL, LDLDIRECT in the last 72 hours. Thyroid Function Tests: No results for input(s): TSH, T4TOTAL, FREET4, T3FREE, THYROIDAB in the last 72 hours. Anemia Panel: No results for input(s): VITAMINB12, FOLATE, FERRITIN, TIBC, IRON, RETICCTPCT in the last 72 hours. Sepsis Labs: Recent Labs  Lab 07/23/19 1610 07/23/19 1649 07/24/19 0809 07/24/19 0941  PROCALCITON  --  0.17 0.22  --   LATICACIDVEN 2.4*  --   --  0.8    Recent Results (from the past 240 hour(s))  Blood Culture (routine x 2)     Status: None (Preliminary result)   Collection Time: 07/23/19  4:54 PM   Specimen: BLOOD  Result Value Ref Range Status   Specimen Description BLOOD RIGHT ANTECUBITAL  Final   Special Requests   Final    BOTTLES DRAWN AEROBIC AND ANAEROBIC Blood Culture adequate volume   Culture   Final    NO GROWTH 2 DAYS Performed at [facility], [address], [place], [place] [address]    Report Status PENDING  Incomplete  Blood Culture (routine x 2)     Status: None (Preliminary result)   Collection Time: 07/23/19  6:17 PM   Specimen: BLOOD  Result Value Ref Range Status   Specimen Description BLOOD LAC  Final   Special Requests   Final    BOTTLES DRAWN AEROBIC AND ANAEROBIC Blood Culture adequate volume   Culture   Final    NO GROWTH 2 DAYS Performed at [facility], [address] [place], [place] [address]    Report Status PENDING  Incomplete  SARS CORONAVIRUS 2 (TAT 6-24 HRS) Nasopharyngeal Nasopharyngeal Swab     Status: None   Collection Time: 07/24/19  3:52 PM   Specimen: Nasopharyngeal Swab  Result Value Ref Range Status   SARS Coronavirus 2 NEGATIVE NEGATIVE Final    Comment: (NOTE) SARS-CoV-2 target nucleic acids are NOT DETECTED. The SARS-CoV-2 RNA is generally detectable in upper and lower respiratory specimens during the acute phase of infection.  Negative results do not preclude SARS-CoV-2 infection, do not rule out co-infections with other pathogens, and should not be used as the sole basis for treatment or other patient management decisions. Negative results must be combined with clinical observations, patient history, and epidemiological information. The expected result is Negative. Fact Sheet for Patients: SugarRoll.be Fact Sheet for Healthcare Providers: https://www.woods-mathews.com/ This test is not yet approved or cleared by the [place] FDA and  has been authorized for detection and/or diagnosis of SARS-CoV-2 by FDA under an Emergency Use Authorization (EUA). This EUA will remain  in effect (meaning this test can be used) for the duration of the COVID-19 declaration under Section 56 4(b)(1) of the Act, 21 U.S.C. section 360bbb-3(b)(1), unless the authorization is terminated or revoked sooner. Performed at [facility], [place] [address] [place], [place] [address]  Radiology Studies: CT Chest W Contrast  Result Date: 07/23/2019 CLINICAL DATA:  Follow-up recent splenic infarcts EXAM: CT CHEST, ABDOMEN, AND PELVIS WITH CONTRAST TECHNIQUE: Multidetector CT imaging of the chest, abdomen and pelvis was performed following the standard protocol during bolus administration of intravenous contrast. CONTRAST:  163mL OMNIPAQUE IOHEXOL 300 MG/ML  SOLN COMPARISON:  CT from the same day. FINDINGS: CT CHEST FINDINGS Cardiovascular: The heart size is mildly enlarged. No significant pericardial effusion. Atherosclerotic changes are noted of the thoracic aorta without evidence for an aneurysm or dissection. The main pulmonary artery is mildly dilated. There is no large centrally located pulmonary embolism. Mediastinum/Nodes: --No mediastinal or hilar lymphadenopathy. --No axillary lymphadenopathy. --No supraclavicular lymphadenopathy. --Normal thyroid gland. --there is a  right-sided thyroid nodule measuring approximately 2.8 cm. This appears relatively stable since 2015. Lungs/Pleura: There is a pulmonary nodule in the right upper lobe measuring approximately 5 mm (axial series 4, image 39). This is essentially stable since 2015. There are multiple small subcentimeter pulmonary nodules in the right middle lobe and right lower lobe that are essentially stable since prior study. There is a stable 1 cm pulmonary nodule in the left lower lobe there is some atelectasis at the lung bases. There is no significant pleural effusion. There is no pneumothorax. Musculoskeletal: No chest wall abnormality. No acute or significant osseous findings. CT ABDOMEN PELVIS FINDINGS Hepatobiliary: There is decreased hepatic attenuation suggestive of hepatic steatosis. Status post cholecystectomy.There is no biliary ductal dilation. Pancreas: Normal contours without ductal dilatation. No peripancreatic fluid collection. Spleen: There are multiple wedge-shaped hypoattenuating defects in the spleen, likely involving approximately 40-50% of the splenic parenchyma. There is no perisplenic hematoma. No evidence for active extravasation. Adrenals/Urinary Tract: --Adrenal glands: No adrenal hemorrhage. --Right kidney/ureter: Multiple nonobstructing stones are noted involving the right kidney. --Left kidney/ureter: There are nonobstructing stones involving the left kidney. --Urinary bladder: Unremarkable. Stomach/Bowel: --Stomach/Duodenum: No hiatal hernia or other gastric abnormality. Normal duodenal course and caliber. --Small bowel: No dilatation or inflammation. --Colon: Rectosigmoid diverticulosis without acute inflammation. --Appendix: Not visualized. No right lower quadrant inflammation or free fluid. Vascular/Lymphatic: Atherosclerotic calcification is present within the non-aneurysmal abdominal aorta, without hemodynamically significant stenosis. The splenic artery is grossly patent. --No retroperitoneal  lymphadenopathy. --No mesenteric lymphadenopathy. --No pelvic or inguinal lymphadenopathy. Reproductive: There is a 2.1 cm cystic structure involving the left ovary. This is relatively stable since 2020 and is consistent with a benign process. Multiple calcified fibroids are noted. Other: No ascites or free air. The abdominal wall is normal. Musculoskeletal. 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 IMPRESSION: 1. Multiple wedge-shaped hypoattenuating defects in the spleen, likely involving approximately 40-50% of the splenic parenchyma. 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 The splenic artery is grossly patent. 2. There is scattered bilateral pulmonary nodules, all of which appear relatively stable since 2015. No further follow-up is required. 3. Mild cardiomegaly. 4. Bilateral nonobstructing nephrolithiasis. 5. Rectosigmoid diverticulosis. 6. There is a 2.8 cm right-sided thyroid nodule which is essentially stable since 2015. No follow-up recommended unless clinically warranted (ref: J Am Coll Radiol. 2015 Feb;12(2): 143-50). 7. Aortic Atherosclerosis (ICD10-I70.0). Electronically Signed   By: Constance Holster M.D.   On: 07/23/2019 15:39   CT ABDOMEN PELVIS W CONTRAST  Result Date: 07/23/2019 CLINICAL DATA:  Follow-up recent splenic infarcts EXAM: CT CHEST, ABDOMEN, AND PELVIS WITH CONTRAST TECHNIQUE: Multidetector CT imaging of the chest, abdomen and pelvis was performed following the standard protocol during  bolus administration of intravenous contrast. CONTRAST:  134mL OMNIPAQUE IOHEXOL 300 MG/ML  SOLN COMPARISON:  CT from the same day. FINDINGS: CT CHEST FINDINGS Cardiovascular: The heart size is mildly enlarged. No significant pericardial effusion. Atherosclerotic changes are noted of the thoracic aorta without evidence for an aneurysm or dissection. The main pulmonary artery is mildly dilated. There is no large centrally located  pulmonary embolism. Mediastinum/Nodes: --No mediastinal or hilar lymphadenopathy. --No axillary lymphadenopathy. --No supraclavicular lymphadenopathy. --Normal thyroid gland. --there is a right-sided thyroid nodule measuring approximately 2.8 cm. This appears relatively stable since 2015. Lungs/Pleura: There is a pulmonary nodule in the right upper lobe measuring approximately 5 mm (axial series 4, image 39). This is essentially stable since 2015. There are multiple small subcentimeter pulmonary nodules in the right middle lobe and right lower lobe that are essentially stable since prior study. There is a stable 1 cm pulmonary nodule in the left lower lobe there is some atelectasis at the lung bases. There is no significant pleural effusion. There is no pneumothorax. Musculoskeletal: No chest wall abnormality. No acute or significant osseous findings. CT ABDOMEN PELVIS FINDINGS Hepatobiliary: There is decreased hepatic attenuation suggestive of hepatic steatosis. Status post cholecystectomy.There is no biliary ductal dilation. Pancreas: Normal contours without ductal dilatation. No peripancreatic fluid collection. Spleen: There are multiple wedge-shaped hypoattenuating defects in the spleen, likely involving approximately 40-50% of the splenic parenchyma. There is no perisplenic hematoma. No evidence for active extravasation. Adrenals/Urinary Tract: --Adrenal glands: No adrenal hemorrhage. --Right kidney/ureter: Multiple nonobstructing stones are noted involving the right kidney. --Left kidney/ureter: There are nonobstructing stones involving the left kidney. --Urinary bladder: Unremarkable. Stomach/Bowel: --Stomach/Duodenum: No hiatal hernia or other gastric abnormality. Normal duodenal course and caliber. --Small bowel: No dilatation or inflammation. --Colon: Rectosigmoid diverticulosis without acute inflammation. --Appendix: Not visualized. No right lower quadrant inflammation or free fluid. Vascular/Lymphatic:  Atherosclerotic calcification is present within the non-aneurysmal abdominal aorta, without hemodynamically significant stenosis. The splenic artery is grossly patent. --No retroperitoneal lymphadenopathy. --No mesenteric lymphadenopathy. --No pelvic or inguinal lymphadenopathy. Reproductive: There is a 2.1 cm cystic structure involving the left ovary. This is relatively stable since 2020 and is consistent with a benign process. Multiple calcified fibroids are noted. Other: No ascites or free air. The abdominal wall is normal. Musculoskeletal. 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 IMPRESSION: 1. Multiple wedge-shaped hypoattenuating defects in the spleen, likely involving approximately 40-50% of the splenic parenchyma. 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 The splenic artery is grossly patent. 2. There is scattered bilateral pulmonary nodules, all of which appear relatively stable since 2015. No further follow-up is required. 3. Mild cardiomegaly. 4. Bilateral nonobstructing nephrolithiasis. 5. Rectosigmoid diverticulosis. 6. There is a 2.8 cm right-sided thyroid nodule which is essentially stable since 2015. No follow-up recommended unless clinically warranted (ref: J Am Coll Radiol. 2015 Feb;12(2): 143-50). 7. Aortic Atherosclerosis (ICD10-I70.0). Electronically Signed   By: Constance Holster M.D.   On: 07/23/2019 15:39   ECHOCARDIOGRAM COMPLETE  Result Date: 07/24/2019    ECHOCARDIOGRAM REPORT   Patient Name:   Julie Acosta Date of Exam: 07/24/2019 Medical Rec #:  [id number]       Height:       60.0 in Accession #:    [id number]      Weight:       161.0 lb Date of Birth:  08-Jun-1933      BSA:  1.702 m Patient Age:    7 years        BP:           138/56 mmHg Patient Gender: F               HR:           119 bpm. Exam Location:  [facility] Procedure: 2D Echo Indications:     FEVER 780.6/R50.9  History:         Patient has no prior  history of Echocardiogram examinations.                  Risk Factors:Diabetes, Hypertension and Dyslipidemia.  Sonographer:     Avanell Shackleton Referring Phys:  [id number] Nolberto Hanlon Diagnosing Phys: Yolonda Kida MD IMPRESSIONS  1. Left ventricular ejection fraction, by estimation, is 70 to 75%. The left ventricle has hyperdynamic function. The left ventricle has no regional wall motion abnormalities. There is mild concentric left ventricular hypertrophy. Left ventricular diastolic parameters are consistent with Grade I diastolic dysfunction (impaired relaxation).  2. Right ventricular systolic function is normal. The right ventricular size is normal. There is moderately elevated pulmonary artery systolic pressure.  3. The mitral valve is grossly normal. Trivial mitral valve regurgitation.  4. The aortic valve is abnormal. Aortic valve regurgitation is not visualized. FINDINGS  Left Ventricle: Left ventricular ejection fraction, by estimation, is 70 to 75%. The left ventricle has hyperdynamic function. The left ventricle has no regional wall motion abnormalities. The left ventricular internal cavity size was normal in size. There is mild concentric left ventricular hypertrophy. Left ventricular diastolic parameters are consistent with Grade I diastolic dysfunction (impaired relaxation). Right Ventricle: The right ventricular size is normal. No increase in right ventricular wall thickness. Right ventricular systolic function is normal. There is moderately elevated pulmonary artery systolic pressure. The tricuspid regurgitant velocity is 3.28 m/s, and with an assumed right atrial pressure of 10 mmHg, the estimated right ventricular systolic pressure is 123XX123 mmHg. Left Atrium: Left atrial size was normal in size. Right Atrium: Right atrial size was normal in size. Pericardium: There is no evidence of pericardial effusion. Mitral Valve: The mitral valve is grossly normal. Trivial mitral valve regurgitation. Tricuspid  Valve: The tricuspid valve is normal in structure. Tricuspid valve regurgitation is mild. Aortic Valve: The aortic valve is abnormal. Aortic valve regurgitation is not visualized. Pulmonic Valve: The pulmonic valve was normal in structure. Pulmonic valve regurgitation is not visualized. Aorta: The aortic root is normal in size and structure. IAS/Shunts: No atrial level shunt detected by color flow Doppler.  LEFT VENTRICLE PLAX 2D LVIDd:         4.54 cm LVIDs:         2.32 cm LV PW:         0.82 cm LV IVS:        0.91 cm LVOT diam:     1.90 cm LVOT Area:     2.84 cm  LEFT ATRIUM           Index LA diam:      4.80 cm 2.82 cm/m LA Vol (A4C): 77.5 ml 45.53 ml/m   AORTA Ao Root diam: 2.80 cm MITRAL VALVE                TRICUSPID VALVE MV Area (PHT): 2.17 cm     TR Peak grad:   43.0 mmHg MV Decel Time: 350 msec     TR Vmax:  328.00 cm/s MV E velocity: 110.00 cm/s MV A velocity: 176.00 cm/s  SHUNTS MV E/A ratio:  0.62         Systemic Diam: 1.90 cm Yolonda Kida MD Electronically signed by Yolonda Kida MD Signature Date/Time: 07/24/2019/11:40:35 PM    Final    ECHO TEE  Result Date: 07/25/2019    TRANSESOPHOGEAL ECHO REPORT   Patient Name:   Julie Acosta Date of Exam: 07/25/2019 Medical Rec #:  [id number]       Height:       60.0 in Accession #:    [id number]      Weight:       160.9 lb Date of Birth:  04-01-1934      BSA:          1.702 m Patient Age:    89 years        BP:           97/39 mmHg Patient Gender: F               HR:           96 bpm. Exam Location:  [facility] Procedure: Transesophageal Echo, Color Doppler and Cardiac Doppler Indications:     SBE-subacute bacterial endocarditis 421.0  History:         Patient has prior history of Echocardiogram examinations, most                  recent 07/24/2019. High blood pressure.  Sonographer:     Sherrie Sport RDCS (AE) Referring Phys:  [place] Diagnosing Phys: Serafina Royals MD PROCEDURE: The transesophogeal probe was passed without  difficulty through the esophogus of the patient. Sedation performed by performing physician. The patient developed no complications during the procedure. IMPRESSIONS  1. Left ventricular ejection fraction, by estimation, is 55 to 60%. The left ventricle has normal function. The left ventricle has no regional wall motion abnormalities.  2. Right ventricular systolic function is normal. The right ventricular size is normal.  3. Left atrial size was mild to moderately dilated. No left atrial/left atrial appendage thrombus was detected.  4. The mitral valve is normal in structure. Mild to moderate mitral valve regurgitation.  5. The aortic valve is normal in structure. Aortic valve regurgitation is trivial.  6. There is mild (Grade II) plaque. FINDINGS  Left Ventricle: Left ventricular ejection fraction, by estimation, is 55 to 60%. The left ventricle has normal function. The left ventricle has no regional wall motion abnormalities. The left ventricular internal cavity size was normal in size. There is  no left ventricular hypertrophy. Right Ventricle: The right ventricular size is normal. No increase in right ventricular wall thickness. Right ventricular systolic function is normal. Left Atrium: Left atrial size was mild to moderately dilated. No left atrial/left atrial appendage thrombus was detected. Right Atrium: Right atrial size was normal in size. Pericardium: There is no evidence of pericardial effusion. Mitral Valve: The mitral valve is normal in structure. Mild to moderate mitral valve regurgitation. There is no evidence of mitral valve vegetation. Tricuspid Valve: The tricuspid valve is normal in structure. Tricuspid valve regurgitation is mild. There is no evidence of tricuspid valve vegetation. Aortic Valve: The aortic valve is normal in structure. Aortic valve regurgitation is trivial. There is no evidence of aortic valve vegetation. Pulmonic Valve: The pulmonic valve was grossly normal. Pulmonic valve  regurgitation is not visualized. Aorta: The aortic root and ascending aorta are structurally normal, with  no evidence of dilitation. There is mild (Grade II) plaque. IAS/Shunts: There is right bowing of the interatrial septum, suggestive of elevated left atrial pressure. No atrial level shunt detected by color flow Doppler. Serafina Royals MD Electronically signed by Serafina Royals MD Signature Date/Time: 07/25/2019/12:24:42 PM    Final         Scheduled Meds: . fenofibrate  160 mg Oral Daily  . fentaNYL      . insulin aspart  0-15 Units Subcutaneous Q4H  . letrozole  2.5 mg Oral Daily  . metoprolol succinate  12.5 mg Oral QHS  . midazolam      . multivitamin with minerals  1 tablet Oral Daily  . pantoprazole  40 mg Oral Daily  . PARoxetine  40 mg Oral Daily  . raloxifene  60 mg Oral Daily  . sodium chloride flush  3 mL Intravenous Q12H  . sodium chloride flush       Continuous Infusions: . sodium chloride 100 mL/hr at 07/25/19 1004  . ceFEPime (MAXIPIME) IV 2 g (07/25/19 1008)  . heparin 1,000 Units/hr (07/25/19 [id number])  . vancomycin 200 mL/hr at 07/25/19 [id number]    Assessment & Plan:   Active Problems:   Splenic infarct   1,sepsis without shock- etiology unclear Patient with tachycardia, fever, leukocytosis CT scan with no infection but however found splenic multiple infarcts continue empirically treat her with IV antibiotics, started with Vanco and cefepime in the ER we will continue Check urine culture urinalysis, blood cultures-pending   2.  Splenic multiple infarcts-need to rule out embolic source History of DVT and blood clots per daughter On Xarelto, will hold and start on heparin drip Pharmacy consulted for heparin drip management We will obtain echo TEE no vegetation or thrombus consult oncology pending   3.  Diabetes mellitus type 2-we will hold p.o. medications Placed on R ISS, check fingersticks  A1c 6.6  4.  History of breast cancer-continue  medications  DVT prophylaxis: heparin gtt Code Status: DNR  Family Communication:  Updated daughter Disposition Plan: back home Barrier: s/p TEE, still with pain. W/u for splenic infarct still pending. On heparin gtt        LOS: 2 days   Time spent: 45 minutes with more than 50% COC    Nolberto Hanlon, MD [facility] Pager [phone]  If 7PM-7AM, please contact night-coverage [email or web address] Password [facility] 07/25/2019, 2:35 PM Patient ID: Julie Acosta, female   DOB: 1934/01/29, 86 y.o.   MRN: [id number]

## 2019-07-25 NOTE — CV Procedure (Signed)
Transesophageal echocardiogram preliminary report  TROYA CIANCIULLI [id number] 10/02/1933  Preliminary diagnosis  Bacteremia with possible endocarditis  Postprocedural diagnosis Normal LV function without evidence of endocarditis or vegetation  Time out A timeout was performed by the nursing staff and physicians specifically identifying the procedure performed, identification of the patient, the type of sedation, all allergies and medications, all pertinent medical history, and presedation assessment of nasopharynx. The patient and or family understand the risks of the procedure including the rare risks of death, stroke, heart attack, esophogeal perforation, sore throat, and reaction to medications given.  Moderate sedation During this procedure the patient has received Versed 2 milligrams and fentanyl 75 micrograms to achieve appropriate moderate sedation.  The patient had continued monitoring of heart rate, oxygenation, blood pressure, respiratory rate, and extent of signs of sedation throughout the entire procedure.  The patient received this moderate sedation over a period of 20 minutes.  Both the nursing staff and I were present during the procedure when the patient had moderate sedation for 100% of the time.  Treatment considerations  No additional treatment considerations needed for bacteremia due to no current evidence of endocarditis  For further details of transesophageal echocardiogram please refer to final report.  Signed,  Corey Skains M.D. [facility] 07/25/2019 8:51 AM

## 2019-07-26 DIAGNOSIS — E861 Hypovolemia: Secondary | ICD-10-CM

## 2019-07-26 DIAGNOSIS — I9589 Other hypotension: Secondary | ICD-10-CM

## 2019-07-26 LAB — CBC
HCT: 27.2 % — ABNORMAL LOW (ref 36.0–46.0)
Hemoglobin: 7.5 g/dL — ABNORMAL LOW (ref 12.0–15.0)
MCH: 20.9 pg — ABNORMAL LOW (ref 26.0–34.0)
MCHC: 27.6 g/dL — ABNORMAL LOW (ref 30.0–36.0)
MCV: 76 fL — ABNORMAL LOW (ref 80.0–100.0)
Platelets: 183 10*3/uL (ref 150–400)
RBC: 3.58 MIL/uL — ABNORMAL LOW (ref 3.87–5.11)
RDW: 19.9 % — ABNORMAL HIGH (ref 11.5–15.5)
WBC: 9.9 10*3/uL (ref 4.0–10.5)
nRBC: 0 % (ref 0.0–0.2)

## 2019-07-26 LAB — ANTITHROMBIN III: AntiThromb III Func: 105 % (ref 75–120)

## 2019-07-26 LAB — HEPARIN LEVEL (UNFRACTIONATED): Heparin Unfractionated: 0.19 IU/mL — ABNORMAL LOW (ref 0.30–0.70)

## 2019-07-26 LAB — GLUCOSE, CAPILLARY
Glucose-Capillary: 134 mg/dL — ABNORMAL HIGH (ref 70–99)
Glucose-Capillary: 126 mg/dL — ABNORMAL HIGH (ref 70–99)
Glucose-Capillary: 130 mg/dL — ABNORMAL HIGH (ref 70–99)
Glucose-Capillary: 189 mg/dL — ABNORMAL HIGH (ref 70–99)

## 2019-07-26 LAB — APTT: aPTT: 62 seconds — ABNORMAL HIGH (ref 24–36)

## 2019-07-26 LAB — CREATININE, SERUM
Creatinine, Ser: 0.55 mg/dL (ref 0.44–1.00)
GFR calc Af Amer: >60 mL/min (ref >60)
GFR calc non Af Amer: >60 mL/min (ref >60)

## 2019-07-26 MED ORDER — PIPERACILLIN-TAZOBACTAM 3.375 G IVPB
3.3750 g | Freq: Three times a day (TID) | INTRAVENOUS | Status: DC
  Administered 2019-07-26 – 2019-07-27 (×4): 3.375 g via INTRAVENOUS
  Filled 2019-07-26 (×4): qty 50

## 2019-07-26 MED ORDER — ENOXAPARIN SODIUM 80 MG/0.8ML ~~LOC~~ SOLN
1.0000 mg/kg | Freq: Two times a day (BID) | SUBCUTANEOUS | Status: DC
  Administered 2019-07-26 – 2019-07-27 (×3): 75 mg via SUBCUTANEOUS
  Filled 2019-07-26 (×4): qty 0.8

## 2019-07-26 MED ORDER — SENNOSIDES-DOCUSATE SODIUM 8.6-50 MG PO TABS
2.0000 | ORAL_TABLET | Freq: Two times a day (BID) | ORAL | Status: DC
  Administered 2019-07-26 (×2): 2 via ORAL
  Filled 2019-07-26 (×3): qty 2

## 2019-07-26 MED ORDER — POLYETHYLENE GLYCOL 3350 17 G PO PACK
17.0000 g | PACK | Freq: Every day | ORAL | Status: DC
  Administered 2019-07-26: 17 g via ORAL
  Filled 2019-07-26 (×2): qty 1

## 2019-07-26 NOTE — Progress Notes (Signed)
PROGRESS NOTE    Julie Acosta  [id number] DOB: [birth year] DOA: 07/23/2019 PCP: Maryland Pink, MD    Brief Narrative:  Julie Acosta is a 86 y.o. female with medical history significant of  anxiety, breast cancer, diabetes, osteopenia, left leg sarcoma, status post left hemipelvectomy and amputation with no evidence of recurrence presents to the emergency department for left flank pain. 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 Multiple wedge-shaped hypoattenuating defects in the spleen, likely involving approximately 40-50% of the splenic parenchyma. 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 The splenic artery is grossly patent. 2. There is scattered bilateral pulmonary nodules, all of which appear relatively stable since 2015. No further follow-up is required. 3. Mild cardiomegaly. 4. Bilateral nonobstructing nephrolithiasis. 5. Rectosigmoid diverticulosis. 6. There is a 2.8 cm right-sided thyroid nodule which is essentially stable since 2015. No follow-up recommended unless clinically warranted (ref: J Am Coll Radiol. 2015 Feb;12(2): 143-50). 7. Aortic Atherosclerosis (ICD10-I70.0).  Antimicrobials:       Subjective: Laying in bed this AM.  Denies shortness of breath, chest pain, dizziness or lightheadedness.  No bowels so far.  Hypotensive  Objective: Vitals:   07/26/19 0501 07/26/19 0819 07/26/19 1139 07/26/19 1142  BP: (!) 100/56 (!) 107/50 (!) 71/59 131/61  Pulse: 89 88 89 91  Resp: 16     Temp: 98.7 F (37.1 C)  98.3 F (36.8 C)   TempSrc: Oral  Oral   SpO2: (!) 89% 94% 91%   Weight:      Height:        Intake/Output Summary (Last 24 hours) at 07/26/2019 1524 Last data filed at 07/26/2019 0200 Gross per 24 hour  Intake 1166.25 ml  Output 550 ml  Net 616.25 ml   Filed Weights   07/24/19 1744 07/25/19 0755  Weight: 73 kg 72.6 kg     Examination:  General exam: Appears calm and comfortable ,nad Respiratory system: Clear to auscultation. Respiratory effort normal.no wheezing Cardiovascular system: S1 & S2 heard, RRR. No  murmurs, rubs,gallops Gastrointestinal system: Abdomen is nondistended, soft and nontender. Normal bowel sounds heard. Central nervous system: sleepy. Extremities: Right lower extremity no edema, left lower extremity amputated Skin: No rashes, lesions or ulcers Psychiatry:  Mood & affect appropriate in current setting.     Data Reviewed: I have personally reviewed following labs and imaging studies  CBC: Recent Labs  Lab 07/23/19 1317 07/24/19 0809 07/25/19 0233 07/26/19 0641  WBC 17.3* 14.2* 13.5* 9.9  HGB 9.5* 8.1* 7.8* 7.5*  HCT 34.5* 29.0* 28.1* 27.2*  MCV 75.5* 75.1* 76.6* 76.0*  PLT 277 209 184 [id number]   Basic Metabolic Panel: Recent Labs  Lab 07/23/19 1317 07/24/19 0809 07/25/19 0233 07/26/19 0641  NA 136 137 137  --   K 4.6 3.8 3.7  --   CL 105 108 110  --   CO2 23 22 22   --   GLUCOSE 241* 153* 175*  --   BUN 14 9 12   --   CREATININE 0.64 0.54 0.46 0.55  CALCIUM 10.7* 8.9 9.6  --    GFR: Estimated Creatinine Clearance: 47.4 mL/min (by C-G formula based on SCr of 0.55 mg/dL). Liver Function Tests: Recent Labs  Lab 07/23/19 1317 07/24/19 0809  AST 32 17  ALT 13 9  ALKPHOS 50 35*  BILITOT 0.6 0.8  PROT 7.3 5.6*  ALBUMIN 4.0 2.8*   Recent Labs  Lab 07/23/19 1317  LIPASE 40   No results for input(s): AMMONIA in the last 168 hours. Coagulation Profile: Recent Labs  Lab 07/24/19 0809  INR 1.3*   Cardiac Enzymes: No results for input(s): CKTOTAL, CKMB, CKMBINDEX, TROPONINI in the last 168 hours. BNP (last 3 results) No results for input(s): PROBNP in the last 8760 hours. HbA1C: No results for input(s): HGBA1C in the last 72 hours. CBG: Recent Labs  Lab 07/25/19 1136 07/25/19 1552 07/25/19 2135 07/26/19 0800 07/26/19 1136  GLUCAP 157* 142* 200*  134* 126*   Lipid Profile: No results for input(s): CHOL, HDL, LDLCALC, TRIG, CHOLHDL, LDLDIRECT in the last 72 hours. Thyroid Function Tests: No results for input(s): TSH, T4TOTAL, FREET4, T3FREE, THYROIDAB in the last 72 hours. Anemia Panel: No results for input(s): VITAMINB12, FOLATE, FERRITIN, TIBC, IRON, RETICCTPCT in the last 72 hours. Sepsis Labs: Recent Labs  Lab 07/23/19 1610 07/23/19 1649 07/24/19 0809 07/24/19 0941  PROCALCITON  --  0.17 0.22  --   LATICACIDVEN 2.4*  --   --  0.8    Recent Results (from the past 240 hour(s))  Blood Culture (routine x 2)     Status: None (Preliminary result)   Collection Time: 07/23/19  4:54 PM   Specimen: BLOOD  Result Value Ref Range Status   Specimen Description BLOOD RIGHT ANTECUBITAL  Final   Special Requests   Final    BOTTLES DRAWN AEROBIC AND ANAEROBIC Blood Culture adequate volume   Culture   Final    NO GROWTH 3 DAYS Performed at [facility], [address] [place], [place] [address]    Report Status PENDING  Incomplete  Blood Culture (routine x 2)     Status: None (Preliminary result)   Collection Time: 07/23/19  6:17 PM   Specimen: BLOOD  Result Value Ref Range Status   Specimen Description BLOOD LAC  Final   Special Requests   Final    BOTTLES DRAWN AEROBIC AND ANAEROBIC Blood Culture adequate volume   Culture   Final    NO GROWTH 3 DAYS Performed at [facility], [address] [place], [place] [address]    Report Status PENDING  Incomplete  SARS CORONAVIRUS 2 (TAT 6-24 HRS) Nasopharyngeal Nasopharyngeal Swab     Status: None   Collection Time: 07/24/19  3:52 PM   Specimen: Nasopharyngeal Swab  Result Value Ref Range Status   SARS Coronavirus 2 NEGATIVE NEGATIVE Final    Comment: (NOTE) SARS-CoV-2 target nucleic acids are NOT DETECTED. The SARS-CoV-2 RNA is generally detectable in upper and lower respiratory specimens during the acute phase of infection. Negative results do not  preclude SARS-CoV-2 infection, do not rule out co-infections with other pathogens, and should not be used as the sole basis for treatment or other patient management decisions. Negative results must be combined with clinical observations, patient history, and epidemiological information. The expected result is Negative. Fact Sheet for Patients: SugarRoll.be Fact Sheet for Healthcare Providers: https://www.woods-mathews.com/ This test is not yet approved or cleared by the [place] FDA and  has been authorized for detection and/or diagnosis of SARS-CoV-2 by FDA under an Emergency Use Authorization (EUA). This EUA will remain  in effect (meaning this test can be used) for the duration of the COVID-19 declaration under Section 56 4(b)(1) of the Act, 21 U.S.C. section 360bbb-3(b)(1), unless the authorization is terminated or revoked sooner. Performed at [facility], [place] [address] [place], [place] [address]  Radiology Studies: ECHO TEE  Result Date: 07/25/2019    TRANSESOPHOGEAL ECHO REPORT   Patient Name:   Julie Acosta Date of Exam: 07/25/2019 Medical Rec #:  [id number]       Height:       60.0 in Accession #:    [id number]      Weight:       160.9 lb Date of Birth:  14-Nov-1933      BSA:          1.702 m Patient Age:    22 years        BP:           97/39 mmHg Patient Gender: F               HR:           96 bpm. Exam Location:  [facility] Procedure: Transesophageal Echo, Color Doppler and Cardiac Doppler Indications:     SBE-subacute bacterial endocarditis 421.0  History:         Patient has prior history of Echocardiogram examinations, most                  recent 07/24/2019. High blood pressure.  Sonographer:     Sherrie Sport RDCS (AE) Referring Phys:  [place] Diagnosing Phys: Serafina Royals MD PROCEDURE: The transesophogeal probe was passed without difficulty through the esophogus of the patient. Sedation performed  by performing physician. The patient developed no complications during the procedure. IMPRESSIONS  1. Left ventricular ejection fraction, by estimation, is 55 to 60%. The left ventricle has normal function. The left ventricle has no regional wall motion abnormalities.  2. Right ventricular systolic function is normal. The right ventricular size is normal.  3. Left atrial size was mild to moderately dilated. No left atrial/left atrial appendage thrombus was detected.  4. The mitral valve is normal in structure. Mild to moderate mitral valve regurgitation.  5. The aortic valve is normal in structure. Aortic valve regurgitation is trivial.  6. There is mild (Grade II) plaque. FINDINGS  Left Ventricle: Left ventricular ejection fraction, by estimation, is 55 to 60%. The left ventricle has normal function. The left ventricle has no regional wall motion abnormalities. The left ventricular internal cavity size was normal in size. There is  no left ventricular hypertrophy. Right Ventricle: The right ventricular size is normal. No increase in right ventricular wall thickness. Right ventricular systolic function is normal. Left Atrium: Left atrial size was mild to moderately dilated. No left atrial/left atrial appendage thrombus was detected. Right Atrium: Right atrial size was normal in size. Pericardium: There is no evidence of pericardial effusion. Mitral Valve: The mitral valve is normal in structure. Mild to moderate mitral valve regurgitation. There is no evidence of mitral valve vegetation. Tricuspid Valve: The tricuspid valve is normal in structure. Tricuspid valve regurgitation is mild. There is no evidence of tricuspid valve vegetation. Aortic Valve: The aortic valve is normal in structure. Aortic valve regurgitation is trivial. There is no evidence of aortic valve vegetation. Pulmonic Valve: The pulmonic valve was grossly normal. Pulmonic valve regurgitation is not visualized. Aorta: The aortic root and ascending  aorta are structurally normal, with no evidence of dilitation. There is mild (Grade II) plaque. IAS/Shunts: There is right bowing of the interatrial septum, suggestive of elevated left atrial pressure. No atrial level shunt detected by color flow Doppler. Serafina Royals MD Electronically signed by Serafina Royals MD Signature Date/Time: 07/25/2019/12:24:42 PM    Final  Scheduled Meds: . enoxaparin (LOVENOX) injection  1 mg/kg Subcutaneous Q12H  . fenofibrate  160 mg Oral Daily  . insulin aspart  0-15 Units Subcutaneous TID WC  . insulin aspart  0-5 Units Subcutaneous QHS  . letrozole  2.5 mg Oral Daily  . metoprolol succinate  12.5 mg Oral QHS  . multivitamin with minerals  1 tablet Oral Daily  . pantoprazole  40 mg Oral Daily  . PARoxetine  40 mg Oral Daily  . polyethylene glycol  17 g Oral Daily  . raloxifene  60 mg Oral Daily  . senna-docusate  2 tablet Oral BID  . sodium chloride flush  3 mL Intravenous Q12H   Continuous Infusions: . sodium chloride 100 mL/hr at 07/26/19 1205  . piperacillin-tazobactam (ZOSYN)  IV 3.375 g (07/26/19 1416)    Assessment & Plan:   Active Problems:   Controlled type 2 diabetes mellitus without complication, without long-term current use of insulin (HCC)   Splenic infarct   Sepsis ([place])   1,sepsis without shock- etiology unclear, possibly from splenic infarct Patient with tachycardia, fever, leukocytosis CT scan with no infection but however found splenic multiple infarcts continue empirically treat her with IV antibiotics, started with Vanco and cefepime in the ER we will continue Check urine culture  blood cultures-pending   2.  Splenic multiple infarcts-etiology unclear History of DVT and blood clots per daughter On Xarelto, will hold and start on heparin drip Pharmacy consulted for heparin drip management TEE no vegetation or thrombus Oncologist input greatly appreciated Plan: Per oncology continue anticoagulation,  transition to Lovenox 1 mg/kg twice daily until patient is evaluated in the cancer center in 1 to 2 weeks at which time we will discuss other anticoagulation options    3.  Diabetes mellitus type 2-we will hold p.o. medications Placed on R ISS, check fingersticks  A1c 6.6  4.  History of breast cancer-continue medications Continue letrozole as prescribed.  Patient's most recent mammogram on June 21, 2019 was reported as BI-RADS 2.   5.hypotension- drop in h/h.  Since on anticoagulation will check stool occult.  Discussed this with nursing staff.   DVT prophylaxis:lovenox Code Status: DNR  Family Communication:  Updated daughter Disposition Plan: back home Barrier:hypotensive, need to r/o gib since on a/c. H/h lower. Occult stool pending.        LOS: 3 days   Time spent: 45 minutes with more than 50% COC    Nolberto Hanlon, MD [facility] Pager [phone]  If 7PM-7AM, please contact night-coverage [email or web address] Password [facility] 07/26/2019, 3:24 PM Patient ID: Julie Acosta, female   DOB: 28-Aug-1933, 86 y.o.   MRN: [id number]

## 2019-07-26 NOTE — TOC Initial Note (Signed)
Transition of Care [facility]) - Initial/Assessment Note    Patient Details  Name: Julie Acosta MRN: [id number] Date of Birth: 1933/07/17  Transition of [facility] CM/SW Contact:    Shelbie Ammons, RN Phone Number: 07/26/2019, 3:05 PM  Clinical Narrative:     RNCM assessed patient at bedside. Patient sitting up in bed eating lunch and in no acute distress. Discussed CM role and what would be discussed and patient was agreeable. Patient reports that she lives in the independent apartments at [facility]. She reports that she is wheelchair bound but is able to do most of her ADLs in home. She does have assistance from the HHAs at [facility] for cleaning and etc. Patient reports that her PCP is Dr. Kary Kos and over the last year she has only left her apartment really just to go to appointments. Discussed with patient that it is the plan for her to be discharged on Lovenox injections twice daily and she will likely need someone to assist her with that but home health would not be able to give every injection. Patient states that she doesn't think she can give herself the injections, that she is not even able to check her own blood sugar. Patient further reports that she believes the staff at the facility can administer the injections but suggested that we contact [facility].   Patient reports that [facility] should be able to transport her home, she reports that her WC is at her home but they should be able to go by and get it.   Contacted Seth Bake with [facility] as she is hospital contact to discuss patient's wishes that the staff there assist with administration. She reported that she would get in touch with their nurse and get back in touch with hospital. Patient did state that she would be open to [facility] services again if need be.                Expected Discharge Plan: [place] Barriers to Discharge: Continued Medical Work up   Patient Goals and CMS Choice      Choice offered to / list presented to : Patient  Expected Discharge Plan and Services Expected Discharge Plan: [place]   Discharge Planning Services: CM Consult Post Acute Care Choice: NA Living arrangements for the past 2 months: [place]                                      Prior Living Arrangements/Services Living arrangements for the past 2 months: [place] Lives with:: Self Patient language and need for interpreter reviewed:: Yes Do you feel safe going back to the place where you live?: Yes      Need for Family Participation in Patient Care: No (Comment) Care giver support system in place?: No (comment) Current home services: Homehealth aide Criminal Activity/Legal Involvement Pertinent to Current Situation/Hospitalization: No - Comment as needed  Activities of Daily Living Home Assistive Devices/Equipment: Wheelchair ADL Screening (condition at time of admission) Patient's cognitive ability adequate to safely complete daily activities?: Yes Is the patient deaf or have difficulty hearing?: No Does the patient have difficulty seeing, even when wearing glasses/contacts?: No Does the patient have difficulty concentrating, remembering, or making decisions?: No Patient able to express need for assistance with ADLs?: Yes Does the patient have difficulty dressing or bathing?: No  Independently performs ADLs?: Yes (appropriate for developmental age) Does the patient have difficulty walking or climbing stairs?: Yes Weakness of Legs: Right Weakness of Arms/Hands: None  Permission Sought/Granted Permission sought to share information with : Facility Sport and exercise psychologist                Emotional Assessment Appearance:: Appears stated age Attitude/Demeanor/Rapport: Engaged Affect (typically observed): Appropriate, Calm Orientation: : Oriented to Self, Oriented to Place, Oriented to  Time, Oriented to  Situation Alcohol / Substance Use: Not Applicable Psych Involvement: No (comment)  Admission diagnosis:  Splenic infarct [D73.5] Left flank pain [R10.9] Sepsis, due to unspecified organism, unspecified whether acute organ dysfunction present [facility]) [A41.9] Patient Active Problem List   Diagnosis Date Noted  . Sepsis ([place])   . Splenic infarct 07/23/2019  . Coagulation disorder ([place]) 05/30/2019  . Ureteral stone with hydronephrosis 11/26/2018  . Pain due to onychomycosis of toenail of right foot 10/18/2018  . S/P bilateral below knee amputation ([place]) 10/18/2018  . Primary cancer of upper outer quadrant of left female breast ([place]) 06/12/2017  . Diarrhea 01/16/2017  . Hematuria 01/12/2017  . Chronic diarrhea 01/12/2017  . 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 Right leg swelling 10/13/2016  . Microscopic hematuria 06/02/2015  . Benign breast lumps 05/30/2015  . Cancer ([place]) 05/30/2015  . Depression 05/30/2015  . Controlled type 2 diabetes mellitus without complication, without long-term current use of insulin ([place]) 05/30/2015  . Hyperlipidemia, unspecified 05/30/2015  . Hypertension 05/30/2015  . Osteoporosis, post-menopausal 05/30/2015  . Panic attacks 05/30/2015   PCP:  Maryland Pink, MD Pharmacy:   [place], [place] - [address] [place] [address] Phone: [phone] Fax: [phone]  [id number] - [place], [place] [address] Phone: [phone] Fax: [phone]     Social Determinants of Health (SDOH) Interventions    Readmission Risk Interventions No flowsheet data found.

## 2019-07-26 NOTE — Consult Note (Signed)
ANTICOAGULATION CONSULT NOTE   Pharmacy Consult for Heparin  Indication: DVT  Patient Measurements: Height: 5' (152.4 cm) Weight: 160 lb (72.6 kg) IBW/kg (Calculated) : 45.5 Heparin Dosing Weight: 73 kg  Vital Signs: Temp: 98.7 F (37.1 C) (03/23 0501) Temp Source: Oral (03/23 0501) BP: 100/56 (03/23 0501) Pulse Rate: 89 (03/23 0501)  Labs: Recent Labs    07/23/19 1317 07/23/19 1317 07/23/19 1817 07/23/19 1817 07/24/19 0809 07/24/19 0809 07/24/19 1856 07/25/19 0233 07/25/19 1034 07/26/19 0641  HGB 9.5*   < >  --   --  8.1*   < >  --  7.8*  --  7.5*  HCT 34.5*   < >  --   --  29.0*  --   --  28.1*  --  27.2*  PLT 277   < >  --   --  209  --   --  184  --  183  APTT  --   --  41*   < >  --   --  133* 91* 88*  --   LABPROT  --   --   --   --  15.7*  --   --   --   --   --   INR  --   --   --   --  1.3*  --   --   --   --   --   HEPARINUNFRC  --   --  >3.60*  --   --   --  0.66  --   --   --   CREATININE 0.64  --   --   --  0.54  --   --  0.46  --   --    < > = values in this interval not displayed.    Estimated Creatinine Clearance: 47.4 mL/min (by C-G formula based on SCr of 0.46 mg/dL).   Medications:  Xarelto 20 mg - last dose 07/23/19 @ 10 AM   Assessment: Pharmacy has been consulted for heparin dosing in a patient taking Xarelto for suspected DVT. Per chart review, she has been on Xarelto since 2004 but she is unsure of why.  Dr. Kurtis Bushman spoke to the patient's daughter who believes her mother had multiple blood clots and one in the leg as well as a family history of DVT. At this time, Xarelto is being held and heparin started d/t Splenic multiple infarcts. H&H still trending down, platelets are stable  Goal of Therapy:  Heparin level 0.3-0.7 units/ml aPTT 66-102 seconds Monitor platelets by anticoagulation protocol: Yes   Plan:   Anti-Xa level and aPTT are both subtherapeutic but not quite  Correlating. Based on aPTT the level is only slightly  subtherapeutic  Dr Kurtis Bushman wishes to transition to treatment dose enoxaparin: start enoxaparin 1 mg/kg subQ q12h starting one hour after heparin infusion has stopped  Continue to monitor H&H and platelets: repeat CBC in am   Dallie Piles 07/26/2019,7:11 AM

## 2019-07-26 NOTE — Care Management Important Message (Signed)
Important Message  Patient Details  Name: Julie Acosta MRN: [id number] Date of Birth: 01-Feb-1934   Medicare Important Message Given:  Yes     [name] 07/26/2019, 11:08 AM

## 2019-07-27 ENCOUNTER — Ambulatory Visit: Admission: RE | Admit: 2019-07-27 | Payer: Medicare Other | Source: Ambulatory Visit

## 2019-07-27 DIAGNOSIS — E119 Type 2 diabetes mellitus without complications: Secondary | ICD-10-CM

## 2019-07-27 LAB — CARDIOLIPIN ANTIBODIES, IGG, IGM, IGA
Anticardiolipin IgA: <9 APL U/mL (ref 0–11)
Anticardiolipin IgG: <9 GPL U/mL (ref 0–14)
Anticardiolipin IgM: 13 MPL U/mL — ABNORMAL HIGH (ref 0–12)

## 2019-07-27 LAB — GLUCOSE, CAPILLARY
Glucose-Capillary: 148 mg/dL — ABNORMAL HIGH (ref 70–99)
Glucose-Capillary: 174 mg/dL — ABNORMAL HIGH (ref 70–99)

## 2019-07-27 LAB — CBC
HCT: 27.7 % — ABNORMAL LOW (ref 36.0–46.0)
Hemoglobin: 7.8 g/dL — ABNORMAL LOW (ref 12.0–15.0)
MCH: 20.9 pg — ABNORMAL LOW (ref 26.0–34.0)
MCHC: 28.2 g/dL — ABNORMAL LOW (ref 30.0–36.0)
MCV: 74.3 fL — ABNORMAL LOW (ref 80.0–100.0)
Platelets: 214 10*3/uL (ref 150–400)
RBC: 3.73 MIL/uL — ABNORMAL LOW (ref 3.87–5.11)
RDW: 20 % — ABNORMAL HIGH (ref 11.5–15.5)
WBC: 9.2 10*3/uL (ref 4.0–10.5)
nRBC: 0 % (ref 0.0–0.2)

## 2019-07-27 LAB — BETA-2-GLYCOPROTEIN I ABS, IGG/M/A
Beta-2 Glyco I IgG: <9 GPI IgG units (ref 0–20)
Beta-2-Glycoprotein I IgA: <9 GPI IgA units (ref 0–25)
Beta-2-Glycoprotein I IgM: <9 GPI IgM units (ref 0–32)

## 2019-07-27 LAB — PROTEIN C, TOTAL: Protein C, Total: 72 % (ref 60–150)

## 2019-07-27 LAB — HOMOCYSTEINE: Homocysteine: 11.7 umol/L (ref 0.0–21.3)

## 2019-07-27 LAB — OCCULT BLOOD X 1 CARD TO LAB, STOOL: Fecal Occult Bld: NEGATIVE

## 2019-07-27 MED ORDER — ADULT MULTIVITAMIN W/MINERALS CH: 1.0000 | ORAL_TABLET | Freq: Every day | ORAL | Status: DC

## 2019-07-27 MED ORDER — ENOXAPARIN SODIUM 80 MG/0.8ML ~~LOC~~ SOLN: 1.0000 mg/kg | Freq: Two times a day (BID) | SUBCUTANEOUS | 0 refills | Status: DC

## 2019-07-27 NOTE — Care Management (Signed)
Patient to return to [facility] today.  [facility] to provide transportation back to facility today between [place] the independent living RN will administer morning dose of Lovenox Monday-Friday.   Daughter Ledell Noss will administer evening doses and weekend doses.  RNCM spoke with daughter to confirm plan

## 2019-07-27 NOTE — Progress Notes (Signed)
Order received from Dr Griffith to discontinue telemetry ?

## 2019-07-27 NOTE — Discharge Summary (Signed)
Physician Discharge Summary  TRINA ASCH ZGY:[id number] DOB: [birth year] DOA: 07/23/2019  PCP: Maryland Pink, MD  Admit date: 07/23/2019 Discharge date: 07/27/2019  Admitted From: Home, [place] independent apartment Disposition:  Home, [place] independent apartment  Recommendations for Outpatient Follow-up:  1. Follow up with PCP in 1-2 weeks 2. Please obtain BMP/CBC in one week 3. Please follow up with hematology/oncology as scheduled on 3/30 at 1pm 4. Continue Lovenox injections under skin every 12 hours for next 2 weeks, or until follow up with hematology.  Home Health: No  Equipment/Devices: None   Discharge Condition: Stable  CODE STATUS: DNR  Diet recommendation: Carb modified  Brief/Interim Summary:  Natoya Viscomi Robertsis a 86 y.o.femalewith medical history significant ofanxiety, breast cancer, diabetes, osteopenia, left leg sarcoma, status post left hemipelvectomy and amputation with no evidence of recurrencepresents to the emergency department for left flank pain. Found with splenic multiple infarcts.  Splenic multiple infarcts - etiology unclear.  History of DVT and blood clots per daughter.  Was on Xarelto.  TEE negative. --hematology/oncology consulted --heparin drip while admitted --Subcutaneous Lovenox 1 mg/kg every 12 hours x 2 weeks --stop Xarelto  --follow up with oncology on 08/02/19 at 1pm   Sepsis - ruled out.  Met criteria upon admission, but no infection source, ruling out sepsis.  Vital sign abnormalities due to splenic infarct most likely.  Was covered with antibiotics empirically.  Cultures no growth x4 days.  Diabetes mellitus type 2- A1c 6.6.  Resume glipizide and metformin on d/c. --covered with sliding scale insulin during admission  History of breast cancer -- ontinue letrozole as prescribed.  Most recent mammogram on June 21, 2019 was reported as BI-RADS 2.   Hypotension- improved, but still soft diastolic bp's H/H is stable  and no sign of bleeding.   --hold losartan on discharge   Discharge Diagnoses: Active Problems:   Controlled type 2 diabetes mellitus without complication, without long-term current use of insulin ([place])   Splenic infarct   Sepsis [facility])    Discharge Instructions   Discharge Instructions    Call MD for:  extreme fatigue   Complete by: As directed    Call MD for:  persistant dizziness or light-headedness   Complete by: As directed    Call MD for:  severe uncontrolled pain   Complete by: As directed    Call MD for:  temperature >100.4   Complete by: As directed    Diet - low sodium heart healthy   Complete by: As directed    Discharge instructions   Complete by: As directed    Take Lovenox (aka enoxaparin) injections under the skin twice daily for 2 weeks, or until follow up with hematology/oncology.  Stop taking losartan for now.  Your BP has run on the low side during admission, and bottom number still running low. This can be restarted in future if needed and BP not too low.   Increase activity slowly   Complete by: As directed      Allergies as of 07/27/2019      Reactions   Ciprofloxacin Hives   Codeine Other (See Comments)   HALLUCINATIONS   Tape    Rash and skin irritation/ paper tape and tegaderm OK   Ciprocinonide [fluocinolone] Other (See Comments)   unknown   Amoxicillin Other (See Comments)   Upset stomach Has patient had a PCN reaction causing immediate rash, facial/tongue/throat swelling, SOB or lightheadedness with hypotension: No Has patient had a PCN reaction causing severe  rash involving mucus membranes or skin necrosis: No Has patient had a PCN reaction that required hospitalization: No Has patient had a PCN reaction occurring within the last 10 years: Yes If all of the above answers are "NO", then may proceed with Cephalosporin use.;   Cefuroxime Other (See Comments)   OK INTRACAMERALLY PER DR WLP, upset stomach   Lipitor [atorvastatin] Other (See  Comments)   unknown      Medication List    STOP taking these medications   losartan 50 MG tablet Commonly known as: COZAAR   rivaroxaban 20 MG Tabs tablet Commonly known as: XARELTO     TAKE these medications   enoxaparin 80 MG/0.8ML injection Commonly known as: LOVENOX Inject 0.75 mLs (75 mg total) into the skin every 12 (twelve) hours for 14 days.   fenofibrate 160 MG tablet TAKE ONE TABLET BY MOUTH EVERY DAY   glipiZIDE XL 2.5 MG 24 hr tablet Generic drug: glipiZIDE Take 2.5 mg by mouth daily.   letrozole 2.5 MG tablet Commonly known as: FEMARA Take 1 tablet (2.5 mg total) by mouth daily.   metFORMIN 500 MG tablet Commonly known as: GLUCOPHAGE Take 1,000 mg by mouth 2 (two) times daily.   metoprolol succinate 25 MG 24 hr tablet Commonly known as: TOPROL-XL Take 12.5 mg by mouth at bedtime.   multivitamin with minerals Tabs tablet Take 1 tablet by mouth daily. Start taking on: July 28, 2019   omeprazole 40 MG capsule Commonly known as: PRILOSEC Take 40 mg by mouth daily.   PARoxetine 40 MG tablet Commonly known as: PAXIL Take 40 mg by mouth daily.   Polyethyl Glycol-Propyl Glycol 0.4-0.3 % Soln Apply to eye 2 (two) times daily as needed.   raloxifene 60 MG tablet Commonly known as: EVISTA Take 60 mg by mouth daily.      Follow-up Information    Lloyd Huger, MD. Go on 08/02/2019.   Specialty: Oncology Why: 1pm Contact information: [place] [address] [phone]          Allergies  Allergen Reactions  . Ciprofloxacin Hives  . Codeine Other (See Comments)    HALLUCINATIONS  . Tape     Rash and skin irritation/ paper tape and tegaderm OK  . Ciprocinonide [Fluocinolone] Other (See Comments)    unknown  . Amoxicillin Other (See Comments)    Upset stomach Has patient had a PCN reaction causing immediate rash, facial/tongue/throat swelling, SOB or lightheadedness with hypotension: No Has patient had a PCN  reaction causing severe rash involving mucus membranes or skin necrosis: No Has patient had a PCN reaction that required hospitalization: No Has patient had a PCN reaction occurring within the last 10 years: Yes If all of the above answers are "NO", then may proceed with Cephalosporin use.;  . Cefuroxime Other (See Comments)    OK INTRACAMERALLY PER DR WLP, upset stomach  . Lipitor [Atorvastatin] Other (See Comments)    unknown    Consultations:  Hematology/oncology    Procedures/Studies: Abdomen 1 view (KUB)  Result Date: 07/14/2019 CLINICAL DATA:  Kidney stones, history of nephrolithiasis. EXAM: ABDOMEN - 1 VIEW COMPARISON:  03/18/2018 FINDINGS: Nephrolithiasis on the right the least 4-5 calculi noted largest approximately 6 mm. Stool and gas overlie the left renal contour. Calcific density projecting over the medial aspect of the renal contour approximately 5 mm, potentially in the left renal pelvis. Lung bases are clear. Signs of left hemipelvectomy and spinal degenerative change. Scattered loops of gas-filled  small bowel with gas and stool throughout the colon. No signs of obstruction. IMPRESSION: 1. Multiple right-sided renal calculi, largest approximately 5-6 mm. 2. Possible 5 mm left renal calculus. 3. Nonobstructive bowel gas pattern. 4. Changes of left hemipelvectomy. Electronically Signed   By: Zetta Bills M.D.   On: 07/14/2019 08:36   CT Chest W Contrast  Result Date: 07/23/2019 CLINICAL DATA:  Follow-up recent splenic infarcts EXAM: CT CHEST, ABDOMEN, AND PELVIS WITH CONTRAST TECHNIQUE: Multidetector CT imaging of the chest, abdomen and pelvis was performed following the standard protocol during bolus administration of intravenous contrast. CONTRAST:  175m OMNIPAQUE IOHEXOL 300 MG/ML  SOLN COMPARISON:  CT from the same day. FINDINGS: CT CHEST FINDINGS Cardiovascular: The heart size is mildly enlarged. No significant pericardial effusion. Atherosclerotic changes are noted of the  thoracic aorta without evidence for an aneurysm or dissection. The main pulmonary artery is mildly dilated. There is no large centrally located pulmonary embolism. Mediastinum/Nodes: --No mediastinal or hilar lymphadenopathy. --No axillary lymphadenopathy. --No supraclavicular lymphadenopathy. --Normal thyroid gland. --there is a right-sided thyroid nodule measuring approximately 2.8 cm. This appears relatively stable since 2015. Lungs/Pleura: There is a pulmonary nodule in the right upper lobe measuring approximately 5 mm (axial series 4, image 39). This is essentially stable since 2015. There are multiple small subcentimeter pulmonary nodules in the right middle lobe and right lower lobe that are essentially stable since prior study. There is a stable 1 cm pulmonary nodule in the left lower lobe there is some atelectasis at the lung bases. There is no significant pleural effusion. There is no pneumothorax. Musculoskeletal: No chest wall abnormality. No acute or significant osseous findings. CT ABDOMEN PELVIS FINDINGS Hepatobiliary: There is decreased hepatic attenuation suggestive of hepatic steatosis. Status post cholecystectomy.There is no biliary ductal dilation. Pancreas: Normal contours without ductal dilatation. No peripancreatic fluid collection. Spleen: There are multiple wedge-shaped hypoattenuating defects in the spleen, likely involving approximately 40-50% of the splenic parenchyma. There is no perisplenic hematoma. No evidence for active extravasation. Adrenals/Urinary Tract: --Adrenal glands: No adrenal hemorrhage. --Right kidney/ureter: Multiple nonobstructing stones are noted involving the right kidney. --Left kidney/ureter: There are nonobstructing stones involving the left kidney. --Urinary bladder: Unremarkable. Stomach/Bowel: --Stomach/Duodenum: No hiatal hernia or other gastric abnormality. Normal duodenal course and caliber. --Small bowel: No dilatation or inflammation. --Colon: Rectosigmoid  diverticulosis without acute inflammation. --Appendix: Not visualized. No right lower quadrant inflammation or free fluid. Vascular/Lymphatic: Atherosclerotic calcification is present within the non-aneurysmal abdominal aorta, without hemodynamically significant stenosis. The splenic artery is grossly patent. --No retroperitoneal lymphadenopathy. --No mesenteric lymphadenopathy. --No pelvic or inguinal lymphadenopathy. Reproductive: There is a 2.1 cm cystic structure involving the left ovary. This is relatively stable since 2020 and is consistent with a benign process. Multiple calcified fibroids are noted. Other: No ascites or free air. The abdominal wall is normal. Musculoskeletal. 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 IMPRESSION: 1. Multiple wedge-shaped hypoattenuating defects in the spleen, likely involving approximately 40-50% of the splenic parenchyma. 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 The splenic artery is grossly patent. 2. There is scattered bilateral pulmonary nodules, all of which appear relatively stable since 2015. No further follow-up is required. 3. Mild cardiomegaly. 4. Bilateral nonobstructing nephrolithiasis. 5. Rectosigmoid diverticulosis. 6. There is a 2.8 cm right-sided thyroid nodule which is essentially stable since 2015. No follow-up recommended unless clinically warranted (ref: J Am Coll Radiol. 2015 Feb;12(2): 143-50). 7. Aortic Atherosclerosis (ICD10-I70.0). Electronically Signed  By: Constance Holster M.D.   On: 07/23/2019 15:39   CT ABDOMEN PELVIS W CONTRAST  Result Date: 07/23/2019 CLINICAL DATA:  Follow-up recent splenic infarcts EXAM: CT CHEST, ABDOMEN, AND PELVIS WITH CONTRAST TECHNIQUE: Multidetector CT imaging of the chest, abdomen and pelvis was performed following the standard protocol during bolus administration of intravenous contrast. CONTRAST:  122m OMNIPAQUE IOHEXOL 300 MG/ML  SOLN  COMPARISON:  CT from the same day. FINDINGS: CT CHEST FINDINGS Cardiovascular: The heart size is mildly enlarged. No significant pericardial effusion. Atherosclerotic changes are noted of the thoracic aorta without evidence for an aneurysm or dissection. The main pulmonary artery is mildly dilated. There is no large centrally located pulmonary embolism. Mediastinum/Nodes: --No mediastinal or hilar lymphadenopathy. --No axillary lymphadenopathy. --No supraclavicular lymphadenopathy. --Normal thyroid gland. --there is a right-sided thyroid nodule measuring approximately 2.8 cm. This appears relatively stable since 2015. Lungs/Pleura: There is a pulmonary nodule in the right upper lobe measuring approximately 5 mm (axial series 4, image 39). This is essentially stable since 2015. There are multiple small subcentimeter pulmonary nodules in the right middle lobe and right lower lobe that are essentially stable since prior study. There is a stable 1 cm pulmonary nodule in the left lower lobe there is some atelectasis at the lung bases. There is no significant pleural effusion. There is no pneumothorax. Musculoskeletal: No chest wall abnormality. No acute or significant osseous findings. CT ABDOMEN PELVIS FINDINGS Hepatobiliary: There is decreased hepatic attenuation suggestive of hepatic steatosis. Status post cholecystectomy.There is no biliary ductal dilation. Pancreas: Normal contours without ductal dilatation. No peripancreatic fluid collection. Spleen: There are multiple wedge-shaped hypoattenuating defects in the spleen, likely involving approximately 40-50% of the splenic parenchyma. There is no perisplenic hematoma. No evidence for active extravasation. Adrenals/Urinary Tract: --Adrenal glands: No adrenal hemorrhage. --Right kidney/ureter: Multiple nonobstructing stones are noted involving the right kidney. --Left kidney/ureter: There are nonobstructing stones involving the left kidney. --Urinary bladder:  Unremarkable. Stomach/Bowel: --Stomach/Duodenum: No hiatal hernia or other gastric abnormality. Normal duodenal course and caliber. --Small bowel: No dilatation or inflammation. --Colon: Rectosigmoid diverticulosis without acute inflammation. --Appendix: Not visualized. No right lower quadrant inflammation or free fluid. Vascular/Lymphatic: Atherosclerotic calcification is present within the non-aneurysmal abdominal aorta, without hemodynamically significant stenosis. The splenic artery is grossly patent. --No retroperitoneal lymphadenopathy. --No mesenteric lymphadenopathy. --No pelvic or inguinal lymphadenopathy. Reproductive: There is a 2.1 cm cystic structure involving the left ovary. This is relatively stable since 2020 and is consistent with a benign process. Multiple calcified fibroids are noted. Other: No ascites or free air. The abdominal wall is normal. Musculoskeletal. 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 IMPRESSION: 1. Multiple wedge-shaped hypoattenuating defects in the spleen, likely involving approximately 40-50% of the splenic parenchyma. 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 The splenic artery is grossly patent. 2. There is scattered bilateral pulmonary nodules, all of which appear relatively stable since 2015. No further follow-up is required. 3. Mild cardiomegaly. 4. Bilateral nonobstructing nephrolithiasis. 5. Rectosigmoid diverticulosis. 6. There is a 2.8 cm right-sided thyroid nodule which is essentially stable since 2015. No follow-up recommended unless clinically warranted (ref: J Am Coll Radiol. 2015 Feb;12(2): 143-50). 7. Aortic Atherosclerosis (ICD10-I70.0). Electronically Signed   By: CConstance HolsterM.D.   On: 07/23/2019 15:39   ECHOCARDIOGRAM COMPLETE  Result Date: 07/24/2019    ECHOCARDIOGRAM REPORT   Patient Name:   ECAYENNE BREAULTDate of Exam: 07/24/2019 Medical Rec #:  [id number]  Height:       60.0  in Accession #:    [id number]      Weight:       161.0 lb Date of Birth:  October 20, 1933      BSA:          1.702 m Patient Age:    86 years        BP:           138/56 mmHg Patient Gender: F               HR:           119 bpm. Exam Location:  [facility] Procedure: 2D Echo Indications:     FEVER 780.6/R50.9  History:         Patient has no prior history of Echocardiogram examinations.                  Risk Factors:Diabetes, Hypertension and Dyslipidemia.  Sonographer:     Avanell Shackleton Referring Phys:  [id number] Nolberto Hanlon Diagnosing Phys: Yolonda Kida MD IMPRESSIONS  1. Left ventricular ejection fraction, by estimation, is 70 to 75%. The left ventricle has hyperdynamic function. The left ventricle has no regional wall motion abnormalities. There is mild concentric left ventricular hypertrophy. Left ventricular diastolic parameters are consistent with Grade I diastolic dysfunction (impaired relaxation).  2. Right ventricular systolic function is normal. The right ventricular size is normal. There is moderately elevated pulmonary artery systolic pressure.  3. The mitral valve is grossly normal. Trivial mitral valve regurgitation.  4. The aortic valve is abnormal. Aortic valve regurgitation is not visualized. FINDINGS  Left Ventricle: Left ventricular ejection fraction, by estimation, is 70 to 75%. The left ventricle has hyperdynamic function. The left ventricle has no regional wall motion abnormalities. The left ventricular internal cavity size was normal in size. There is mild concentric left ventricular hypertrophy. Left ventricular diastolic parameters are consistent with Grade I diastolic dysfunction (impaired relaxation). Right Ventricle: The right ventricular size is normal. No increase in right ventricular wall thickness. Right ventricular systolic function is normal. There is moderately elevated pulmonary artery systolic pressure. The tricuspid regurgitant velocity is 3.28 m/s, and with an assumed right atrial  pressure of 10 mmHg, the estimated right ventricular systolic pressure is 62.0 mmHg. Left Atrium: Left atrial size was normal in size. Right Atrium: Right atrial size was normal in size. Pericardium: There is no evidence of pericardial effusion. Mitral Valve: The mitral valve is grossly normal. Trivial mitral valve regurgitation. Tricuspid Valve: The tricuspid valve is normal in structure. Tricuspid valve regurgitation is mild. Aortic Valve: The aortic valve is abnormal. Aortic valve regurgitation is not visualized. Pulmonic Valve: The pulmonic valve was normal in structure. Pulmonic valve regurgitation is not visualized. Aorta: The aortic root is normal in size and structure. IAS/Shunts: No atrial level shunt detected by color flow Doppler.  LEFT VENTRICLE PLAX 2D LVIDd:         4.54 cm LVIDs:         2.32 cm LV PW:         0.82 cm LV IVS:        0.91 cm LVOT diam:     1.90 cm LVOT Area:     2.84 cm  LEFT ATRIUM           Index LA diam:      4.80 cm 2.82 cm/m LA Vol (A4C): 77.5 ml 45.53 ml/m   AORTA Ao Root diam: 2.80 cm MITRAL  VALVE                TRICUSPID VALVE MV Area (PHT): 2.17 cm     TR Peak grad:   43.0 mmHg MV Decel Time: 350 msec     TR Vmax:        328.00 cm/s MV E velocity: 110.00 cm/s MV A velocity: 176.00 cm/s  SHUNTS MV E/A ratio:  0.62         Systemic Diam: 1.90 cm Yolonda Kida MD Electronically signed by Yolonda Kida MD Signature Date/Time: 07/24/2019/11:40:35 PM    Final    CT Renal Stone Study  Result Date: 07/23/2019 CLINICAL DATA:  Pt arrives via EMS from home ([facility]) after having L flank pain since Wed that radiates to abdomen and back- pt has a hx of kidney infection and states that this feels similar EXAM: CT ABDOMEN AND PELVIS WITHOUT CONTRAST TECHNIQUE: Multidetector CT imaging of the abdomen and pelvis was performed following the standard protocol without IV contrast. COMPARISON:  CT abdomen pelvis 11/25/2018 FINDINGS: The most lateral aspect of  the left lateral abdominal wall is excluded from field of view. Lower chest: Bibasilar atelectasis. Solid and ground-glass nodules in the right lung base appear stable. There is a 1.2 cm nodule in the left lung base which appears mildly increased in size compared to the prior study. Hepatobiliary: No focal liver abnormality is seen. No gallstones, gallbladder wall thickening, or biliary dilatation. Pancreas: Unremarkable. Spleen: New multiple areas of confluent hypoattenuation in the spleen. Spleen is normal in size. Adrenals/Urinary Tract: Adrenal glands are unremarkable. There are punctate calculi in the bilateral kidneys. No hydronephrosis. Stomach/Bowel: Stomach is within normal limits. No evidence of bowel wall thickening, distention, or inflammatory changes. Multiple colonic diverticula without evidence of diverticulitis. Vascular/Lymphatic: Moderate aortoiliac atherosclerotic calcification without aneurysm. Vascular patency cannot be assessed in the absence of IV contrast. No lymphadenopathy. Reproductive: Fibroid uterus. Bilateral adnexa unremarkable. Other: No abdominal wall hernia or abnormality. No abdominopelvic ascites. Musculoskeletal: Left hemipelvectomy. Degenerative changes in the right sacroiliac joint. IMPRESSION: 1. New multiple areas of confluent hypoattenuation in the spleen are nonspecific but differential includes splenic infarcts and metastatic disease. Contrast-enhanced evaluation is recommended. 2. Bilateral punctate nephrolithiasis without hydronephrosis. 3. Colonic diverticulosis without evidence of diverticulitis. 4. There is a 1.2 cm nodule in the left lung base which appears mildly increased in size compared to the prior study. Nonemergent chest CT is recommended for further evaluation. Aortic Atherosclerosis (ICD10-I70.0). Electronically Signed   By: Audie Pinto M.D.   On: 07/23/2019 14:11   ECHO TEE  Result Date: 07/25/2019    TRANSESOPHOGEAL ECHO REPORT   Patient Name:    ZAHLIA DESHAZER Date of Exam: 07/25/2019 Medical Rec #:  [id number]       Height:       60.0 in Accession #:    [id number]      Weight:       160.9 lb Date of Birth:  05/04/1934      BSA:          1.702 m Patient Age:    31 years        BP:           97/39 mmHg Patient Gender: F               HR:           96 bpm. Exam Location:  [facility] Procedure: Transesophageal Echo, Color Doppler and Cardiac Doppler  Indications:     SBE-subacute bacterial endocarditis 421.0  History:         Patient has prior history of Echocardiogram examinations, most                  recent 07/24/2019. High blood pressure.  Sonographer:     Sherrie Sport RDCS (AE) Referring Phys:  [place] Diagnosing Phys: Serafina Royals MD PROCEDURE: The transesophogeal probe was passed without difficulty through the esophogus of the patient. Sedation performed by performing physician. The patient developed no complications during the procedure. IMPRESSIONS  1. Left ventricular ejection fraction, by estimation, is 55 to 60%. The left ventricle has normal function. The left ventricle has no regional wall motion abnormalities.  2. Right ventricular systolic function is normal. The right ventricular size is normal.  3. Left atrial size was mild to moderately dilated. No left atrial/left atrial appendage thrombus was detected.  4. The mitral valve is normal in structure. Mild to moderate mitral valve regurgitation.  5. The aortic valve is normal in structure. Aortic valve regurgitation is trivial.  6. There is mild (Grade II) plaque. FINDINGS  Left Ventricle: Left ventricular ejection fraction, by estimation, is 55 to 60%. The left ventricle has normal function. The left ventricle has no regional wall motion abnormalities. The left ventricular internal cavity size was normal in size. There is  no left ventricular hypertrophy. Right Ventricle: The right ventricular size is normal. No increase in right ventricular wall thickness. Right ventricular systolic  function is normal. Left Atrium: Left atrial size was mild to moderately dilated. No left atrial/left atrial appendage thrombus was detected. Right Atrium: Right atrial size was normal in size. Pericardium: There is no evidence of pericardial effusion. Mitral Valve: The mitral valve is normal in structure. Mild to moderate mitral valve regurgitation. There is no evidence of mitral valve vegetation. Tricuspid Valve: The tricuspid valve is normal in structure. Tricuspid valve regurgitation is mild. There is no evidence of tricuspid valve vegetation. Aortic Valve: The aortic valve is normal in structure. Aortic valve regurgitation is trivial. There is no evidence of aortic valve vegetation. Pulmonic Valve: The pulmonic valve was grossly normal. Pulmonic valve regurgitation is not visualized. Aorta: The aortic root and ascending aorta are structurally normal, with no evidence of dilitation. There is mild (Grade II) plaque. IAS/Shunts: There is right bowing of the interatrial septum, suggestive of elevated left atrial pressure. No atrial level shunt detected by color flow Doppler. Serafina Royals MD Electronically signed by Serafina Royals MD Signature Date/Time: 07/25/2019/12:24:42 PM    Final        Subjective: Patient seen laying in bed this AM.  No acute events reported.  She denies complaints.  Abdominal pain fully resolved.  No f/c/cp/sob or other complaints.   Discharge Exam: Vitals:   07/27/19 0659 07/27/19 1217  BP: (!) 149/57 (!) 143/49  Pulse: 90 96  Resp: 18 18  Temp: 98.6 F (37 C) 98.5 F (36.9 C)  SpO2: 90% 91%   Vitals:   07/26/19 1142 07/26/19 2101 07/27/19 0659 07/27/19 1217  BP: 131/61 (!) 141/52 (!) 149/57 (!) 143/49  Pulse: 91 (!) 105 90 96  Resp:   18 18  Temp:  98.8 F (37.1 C) 98.6 F (37 C) 98.5 F (36.9 C)  TempSrc:  Oral Oral Oral  SpO2:  94% 90% 91%  Weight:      Height:        General: Pt is alert, awake, not in acute  distress, obese Cardiovascular: RRR, S1/S2  +, no rubs, no gallops Respiratory: CTA bilaterally, no wheezing, no rhonchi Abdominal: Soft, NT, ND, bowel sounds + Extremities: no edema, no cyanosis    The results of significant diagnostics from this hospitalization (including imaging, microbiology, ancillary and laboratory) are listed below for reference.     Microbiology: Recent Results (from the past 240 hour(s))  Blood Culture (routine x 2)     Status: None (Preliminary result)   Collection Time: 07/23/19  4:54 PM   Specimen: BLOOD  Result Value Ref Range Status   Specimen Description BLOOD RIGHT ANTECUBITAL  Final   Special Requests   Final    BOTTLES DRAWN AEROBIC AND ANAEROBIC Blood Culture adequate volume   Culture   Final    NO GROWTH 4 DAYS Performed at [facility], [address] [place], [place] [address]    Report Status PENDING  Incomplete  Blood Culture (routine x 2)     Status: None (Preliminary result)   Collection Time: 07/23/19  6:17 PM   Specimen: BLOOD  Result Value Ref Range Status   Specimen Description BLOOD LAC  Final   Special Requests   Final    BOTTLES DRAWN AEROBIC AND ANAEROBIC Blood Culture adequate volume   Culture   Final    NO GROWTH 4 DAYS Performed at [facility] [place], [place] [address]    Report Status PENDING  Incomplete  SARS CORONAVIRUS 2 (TAT 6-24 HRS) Nasopharyngeal Nasopharyngeal Swab     Status: None   Collection Time: 07/24/19  3:52 PM   Specimen: Nasopharyngeal Swab  Result Value Ref Range Status   SARS Coronavirus 2 NEGATIVE NEGATIVE Final    Comment: (NOTE) SARS-CoV-2 target nucleic acids are NOT DETECTED. The SARS-CoV-2 RNA is generally detectable in upper and lower respiratory specimens during the acute phase of infection. Negative results do not preclude SARS-CoV-2 infection, do not rule out co-infections with other pathogens, and should not be used as the sole basis for treatment or other patient management  decisions. Negative results must be combined with clinical observations, patient history, and epidemiological information. The expected result is Negative. Fact Sheet for Patients: SugarRoll.be Fact Sheet for Healthcare Providers: https://www.woods-mathews.com/ This test is not yet approved or cleared by the [place] FDA and  has been authorized for detection and/or diagnosis of SARS-CoV-2 by FDA under an Emergency Use Authorization (EUA). This EUA will remain  in effect (meaning this test can be used) for the duration of the COVID-19 declaration under Section 56 4(b)(1) of the Act, 21 U.S.C. section 360bbb-3(b)(1), unless the authorization is terminated or revoked sooner. Performed at [facility], [place] [address], [place], [place] [address]      Labs: BNP (last 3 results) No results for input(s): BNP in the last 8760 hours. Basic Metabolic Panel: Recent Labs  Lab 07/23/19 1317 07/24/19 0809 07/25/19 0233 07/26/19 0641  NA 136 137 137  --   K 4.6 3.8 3.7  --   CL 105 108 110  --   CO2 _0 --   GLUCOSE 241* 153* 175*  --   BUN _1 --   CREATININE 0.64 0.54 0.46 0.55  CALCIUM 10.7* 8.9 9.6  --    Liver Function Tests: Recent Labs  Lab 07/23/19 1317 07/24/19 0809  AST 32 17  ALT 13 9  ALKPHOS 50 35*  BILITOT 0.6 0.8  PROT 7.3 5.6*  ALBUMIN 4.0  2.8*   Recent Labs  Lab 07/23/19 1317  LIPASE 40   No results for input(s): AMMONIA in the last 168 hours. CBC: Recent Labs  Lab 07/23/19 1317 07/24/19 0809 07/25/19 0233 07/26/19 0641 07/27/19 0554  WBC 17.3* 14.2* 13.5* 9.9 9.2  HGB 9.5* 8.1* 7.8* 7.5* 7.8*  HCT 34.5* 29.0* 28.1* 27.2* 27.7*  MCV 75.5* 75.1* 76.6* 76.0* 74.3*  PLT 277 209 184 183 214   Cardiac Enzymes: No results for input(s): CKTOTAL, CKMB, CKMBINDEX, TROPONINI in the last 168 hours. BNP: Invalid input(s): POCBNP CBG: Recent Labs  Lab 07/26/19 1136 07/26/19 1640  07/26/19 2058 07/27/19 0752 07/27/19 1148  GLUCAP 126* 130* 189* 148* 174*   D-Dimer No results for input(s): DDIMER in the last 72 hours. Hgb A1c No results for input(s): HGBA1C in the last 72 hours. Lipid Profile No results for input(s): CHOL, HDL, LDLCALC, TRIG, CHOLHDL, LDLDIRECT in the last 72 hours. Thyroid function studies No results for input(s): TSH, T4TOTAL, T3FREE, THYROIDAB in the last 72 hours.  Invalid input(s): FREET3 Anemia work up No results for input(s): VITAMINB12, FOLATE, FERRITIN, TIBC, IRON, RETICCTPCT in the last 72 hours. Urinalysis    Component Value Date/Time   COLORURINE YELLOW (A) 07/23/2019 1317   APPEARANCEUR CLEAR (A) 07/23/2019 1317   APPEARANCEUR Cloudy (A) 11/26/2018 1157   LABSPEC >1.046 (H) 07/23/2019 1317   PHURINE 5.0 07/23/2019 1317   GLUCOSEU >=500 (A) 07/23/2019 1317   HGBUR NEGATIVE 07/23/2019 1317   BILIRUBINUR NEGATIVE 07/23/2019 1317   BILIRUBINUR Negative 11/26/2018 [place] 07/23/2019 1317   PROTEINUR NEGATIVE 07/23/2019 1317   NITRITE NEGATIVE 07/23/2019 1317   LEUKOCYTESUR NEGATIVE 07/23/2019 1317   Sepsis Labs Invalid input(s): PROCALCITONIN,  WBC,  LACTICIDVEN Microbiology Recent Results (from the past 240 hour(s))  Blood Culture (routine x 2)     Status: None (Preliminary result)   Collection Time: 07/23/19  4:54 PM   Specimen: BLOOD  Result Value Ref Range Status   Specimen Description BLOOD RIGHT ANTECUBITAL  Final   Special Requests   Final    BOTTLES DRAWN AEROBIC AND ANAEROBIC Blood Culture adequate volume   Culture   Final    NO GROWTH 4 DAYS Performed at [facility], [address] [place], [place] [address]    Report Status PENDING  Incomplete  Blood Culture (routine x 2)     Status: None (Preliminary result)   Collection Time: 07/23/19  6:17 PM   Specimen: BLOOD  Result Value Ref Range Status   Specimen Description BLOOD LAC  Final   Special Requests   Final    BOTTLES  DRAWN AEROBIC AND ANAEROBIC Blood Culture adequate volume   Culture   Final    NO GROWTH 4 DAYS Performed at [facility], [address], [place], [place] [address]    Report Status PENDING  Incomplete  SARS CORONAVIRUS 2 (TAT 6-24 HRS) Nasopharyngeal Nasopharyngeal Swab     Status: None   Collection Time: 07/24/19  3:52 PM   Specimen: Nasopharyngeal Swab  Result Value Ref Range Status   SARS Coronavirus 2 NEGATIVE NEGATIVE Final    Comment: (NOTE) SARS-CoV-2 target nucleic acids are NOT DETECTED. The SARS-CoV-2 RNA is generally detectable in upper and lower respiratory specimens during the acute phase of infection. Negative results do not preclude SARS-CoV-2 infection, do not rule out co-infections with other pathogens, and should not be used as the sole basis for treatment or other patient management decisions. Negative results must  be combined with clinical observations, patient history, and epidemiological information. The expected result is Negative. Fact Sheet for Patients: SugarRoll.be Fact Sheet for Healthcare Providers: https://www.woods-mathews.com/ This test is not yet approved or cleared by the [place] FDA and  has been authorized for detection and/or diagnosis of SARS-CoV-2 by FDA under an Emergency Use Authorization (EUA). This EUA will remain  in effect (meaning this test can be used) for the duration of the COVID-19 declaration under Section 56 4(b)(1) of the Act, 21 U.S.C. section 360bbb-3(b)(1), unless the authorization is terminated or revoked sooner. Performed at [facility], [place] [address] [place], [place] [address]      Time coordinating discharge: Over 30 minutes  SIGNED:   Ezekiel Slocumb, DO [facility] 07/27/2019, 2:14 PM   If 7PM-7AM, please contact night-coverage [email or web address]

## 2019-07-27 NOTE — Progress Notes (Signed)
Discharge instructions reviewed with patient including followup visits and new medications.  Understanding was verbalized and all questions were answered.  IV removed without complication; patient tolerated well.  Patient discharged home via wheelchair in stable condition escorted by nursing staff.  

## 2019-07-28 LAB — LUPUS ANTICOAGULANT PANEL
DRVVT: 35.4 s (ref 0.0–47.0)
PTT Lupus Anticoagulant: 45.8 s (ref 0.0–51.9)

## 2019-07-28 LAB — CULTURE, BLOOD (ROUTINE X 2)
Culture: NO GROWTH
Culture: NO GROWTH
Special Requests: ADEQUATE
Special Requests: ADEQUATE

## 2019-07-28 LAB — PROTEIN C ACTIVITY: Protein C Activity: 82 % (ref 73–180)

## 2019-07-28 LAB — PROTEIN S, TOTAL: Protein S Ag, Total: 86 % (ref 60–150)

## 2019-07-28 LAB — PROTEIN S ACTIVITY: Protein S Activity: 36 % — ABNORMAL LOW (ref 63–140)

## 2019-07-28 NOTE — NC FL2 (Signed)
[place] LEVEL OF CARE SCREENING TOOL     IDENTIFICATION  Patient Name: Julie Acosta Birthdate: 27-Nov-1933 Sex: female Admission Date (Current Location): 07/23/2019  [facility] and [place] Number:  Engineering geologist and Address:         Provider Number: [id number]  Attending Physician Name and Address:  No att. providers found  Relative Name and Phone Number:       Current Level of Care: Other (Comment)(Respite care) Recommended Level of Care: [place] Prior Approval Number:    Date Approved/Denied:   PASRR Number: [id number]  Discharge Plan: SNF    Current Diagnoses: Patient Active Problem List   Diagnosis Date Noted  . Sepsis ([place])   . Splenic infarct 07/23/2019  . Coagulation disorder ([place]) 05/30/2019  . Ureteral stone with hydronephrosis 11/26/2018  . Pain due to onychomycosis of toenail of right foot 10/18/2018  . S/P bilateral below knee amputation ([place]) 10/18/2018  . Primary cancer of upper outer quadrant of left female breast ([place]) 06/12/2017  . Diarrhea 01/16/2017  . Hematuria 01/12/2017  . Chronic diarrhea 01/12/2017  . 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 Right leg swelling 10/13/2016  . Microscopic hematuria 06/02/2015  . Benign breast lumps 05/30/2015  . Cancer ([place]) 05/30/2015  . Depression 05/30/2015  . Controlled type 2 diabetes mellitus without complication, without long-term current use of insulin ([place]) 05/30/2015  . Hyperlipidemia, unspecified 05/30/2015  . Hypertension 05/30/2015  . Osteoporosis, post-menopausal 05/30/2015  . Panic attacks 05/30/2015    Orientation RESPIRATION BLADDER Height & Weight     Self, Time, Situation, Place  Normal Continent Weight: 72.6 kg Height:  5' (152.4 cm)  BEHAVIORAL SYMPTOMS/MOOD NEUROLOGICAL BOWEL NUTRITION STATUS      Continent Diet(Regular)  AMBULATORY STATUS COMMUNICATION OF NEEDS Skin   Limited Assist Verbally Normal                       Personal  Care Assistance Level of Assistance              Functional Limitations Info             SPECIAL CARE FACTORS FREQUENCY  PT (By licensed PT), OT (By licensed OT)                    Contractures Contractures Info: Not present    Additional Factors Info  Code Status, Allergies Code Status Info: DNR Allergies Info: Ciprofloxacin, Codeine, Tape, Ciprocinonide, Amoxicillin, Cefuroxime, Lipitor           Current Medications (07/28/2019):  This is the current hospital active medication list No current facility-administered medications for this encounter.   Current Outpatient Medications  Medication Sig Dispense Refill  . letrozole (FEMARA) 2.5 MG tablet Take 1 tablet (2.5 mg total) by mouth daily. 30 tablet 3  . metoprolol succinate (TOPROL-XL) 25 MG 24 hr tablet Take 12.5 mg by mouth at bedtime.     Marland Kitchen omeprazole (PRILOSEC) 40 MG capsule Take 40 mg by mouth daily.     Marland Kitchen PARoxetine (PAXIL) 40 MG tablet Take 40 mg by mouth daily.     . raloxifene (EVISTA) 60 MG tablet Take 60 mg by mouth daily.     Marland Kitchen enoxaparin (LOVENOX) 80 MG/0.8ML injection Inject 0.75 mLs (75 mg total) into the skin every 12 (twelve) hours for 14 days. 21 mL 0  . fenofibrate 160 MG tablet TAKE ONE TABLET BY MOUTH EVERY DAY    . GLIPIZIDE  XL 2.5 MG 24 hr tablet Take 2.5 mg by mouth daily.     . metFORMIN (GLUCOPHAGE) 500 MG tablet Take 1,000 mg by mouth 2 (two) times daily.     . Multiple Vitamin (MULTIVITAMIN WITH MINERALS) TABS tablet Take 1 tablet by mouth daily.    Vladimir Faster Glycol-Propyl Glycol 0.4-0.3 % SOLN Apply to eye 2 (two) times daily as needed.       Discharge Medications: Please see discharge summary for a list of discharge medications.  Relevant Imaging Results:  Relevant Lab Results:   Additional Information ss [id number]  Beverly Sessions, RN

## 2019-07-29 LAB — PROTHROMBIN GENE MUTATION

## 2019-07-30 NOTE — Progress Notes (Signed)
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  Oncotype DX score 1.  Homozygous factor V Leiden.  INTERVAL HISTORY: Patient returns to clinic today for hospital follow-up and discussion of her anticoagulation.  Patient was admitted to the hospital with left flank pain and found to have multiple splenic infarcts suspicious for thrombosis.  Patient was on Xarelto at the time.  She continues to have flank pain.  She also admits to occasional chest pain and shortness of breath.  She otherwise feels well.  She is tolerating letrozole without significant side effects.  She has no neurologic complaints.  She denies any recent fevers or illnesses.  She has a good appetite and denies weight loss.  She denies any cough or hemoptysis.  She denies any nausea, vomiting, constipation, or diarrhea.  She has no urinary complaints.  Patient offers no further specific complaints today.  REVIEW OF SYSTEMS:   Review of Systems  Constitutional: Negative.  Negative for fever, malaise/fatigue and weight loss.  Respiratory: Positive for shortness of breath. Negative for cough.   Cardiovascular: Positive for chest pain. Negative for leg swelling.  Gastrointestinal: Negative.  Negative for abdominal pain.  Genitourinary: Positive for flank pain. Negative for dysuria.  Musculoskeletal: Negative for joint pain.  Skin: Negative.  Negative for rash.  Neurological: Positive for weakness. Negative for dizziness, sensory change and headaches.  Psychiatric/Behavioral: Negative.  The patient is not nervous/anxious.     As per HPI. Otherwise, a complete review of systems is  negative.   PAST MEDICAL HISTORY: Reviewed and unchanged.  PAST SURGICAL HISTORY: Past Surgical History:  Procedure Laterality Date  . APPENDECTOMY  1962  . BREAST BIOPSY Right 2006?   benign  . BREAST BIOPSY Left 2019   invasive mammary carcinoma  . BREAST CYST EXCISION Left 07/27/2017   Procedure: SKIN CYST EXCISED;  Surgeon: Robert Bellow, MD;  Location: [facility] ORS;  Service: General;  Laterality: Left;  . BREAST LUMPECTOMY Left 07/2017   invasive mammary, DCIS  mammosite  . BREAST SURGERY Right 2019  . CARPAL TUNNEL RELEASE Right   . CATARACT EXTRACTION W/PHACO Left 11/11/2016   Procedure: CATARACT EXTRACTION PHACO AND INTRAOCULAR LENS PLACEMENT (IOC);  Surgeon: Birder Robson, MD;  Location: [facility] ORS;  Service: Ophthalmology;  Laterality: Left;  [place] 01:07.9 AP% 19.2 CDE 13.02 Fluid pack lot # [id number]  . CATARACT EXTRACTION W/PHACO Right 12/09/2016   Procedure: CATARACT EXTRACTION PHACO AND INTRAOCULAR LENS PLACEMENT (IOC);  Surgeon: Birder Robson, MD;  Location: [facility] ORS;  Service: Ophthalmology;  Laterality: Right;  [place] 00:49 AP% 21.2 CDE 10.39 Fluid pack lot # [id number]  . CHOLECYSTECTOMY    . COLONOSCOPY WITH PROPOFOL N/A 11/22/2014   Procedure: COLONOSCOPY WITH PROPOFOL;  Surgeon: Manya Silvas, MD;  Location: [facility] ENDOSCOPY;  Service: Endoscopy;  Laterality: N/A;  . CYSTOSCOPY WITH STENT PLACEMENT Bilateral 11/27/2018   Procedure: CYSTOSCOPY WITH STENT PLACEMENT;  Surgeon: Festus Aloe, MD;  Location: [facility] ORS;  Service: Urology;  Laterality: Bilateral;  . CYSTOSCOPY/URETEROSCOPY/HOLMIUM LASER/STENT PLACEMENT Bilateral 12/17/2018   Procedure: CYSTOSCOPY/URETEROSCOPY/HOLMIUM LASER/STENT Exchange;  Surgeon: Billey Co, MD;  Location: [facility] ORS;  Service: Urology;  Laterality: Bilateral;  . ESOPHAGOGASTRODUODENOSCOPY  11/22/2014   Procedure:  ESOPHAGOGASTRODUODENOSCOPY (EGD);  Surgeon: Manya Silvas, MD;  Location: [facility] ENDOSCOPY;  Service: Endoscopy;;  .  ESOPHAGOGASTRODUODENOSCOPY (EGD) WITH PROPOFOL N/A 03/05/2017   Procedure: ESOPHAGOGASTRODUODENOSCOPY (EGD) WITH PROPOFOL;  Surgeon: Lin Landsman, MD;  Location: [facility] ENDOSCOPY;  Service: Gastroenterology;  Laterality: N/A;  . EXTRACORPOREAL SHOCK WAVE LITHOTRIPSY Right 03/04/2018   Procedure: EXTRACORPOREAL SHOCK WAVE LITHOTRIPSY (ESWL);  Surgeon: Billey Co, MD;  Location: [facility] ORS;  Service: Urology;  Laterality: Right;  . EYE SURGERY Bilateral 2012   cataract extraction with iol  . GALLBLADDER SURGERY  1968  . hemi pelvectomy     left side at age 67  . HEMIPELVIC Left 1962  . LEG SURGERY Left    AMPUTATION d/t cancer at 86 years old  . MASTECTOMY, PARTIAL Left 07/27/2017   Procedure: MASTECTOMY PARTIAL;  Surgeon: Robert Bellow, MD;  Location: [facility] ORS;  Service: General;  Laterality: Left;  . MOUTH SURGERY  2019   teeth pulled with a bridge insertion.   fell out 12/16/18  . OVARY SURGERY Right    cyst removed  . SAVORY DILATION  11/22/2014   Procedure: SAVORY DILATION;  Surgeon: Manya Silvas, MD;  Location: [facility] ENDOSCOPY;  Service: Endoscopy;;  . SENTINEL NODE BIOPSY Left 07/27/2017   Procedure: SENTINEL NODE BIOPSY;  Surgeon: Robert Bellow, MD;  Location: [facility] ORS;  Service: General;  Laterality: Left;  . SKIN CANCER EXCISION     nose and arm and face  . TEE WITHOUT CARDIOVERSION N/A 07/25/2019   Procedure: TRANSESOPHAGEAL ECHOCARDIOGRAM (TEE);  Surgeon: Corey Skains, MD;  Location: [facility] ORS;  Service: Cardiovascular;  Laterality: N/A;  . TONSILLECTOMY      FAMILY HISTORY: Family History  Problem Relation Age of Onset  . Breast cancer Paternal Aunt   . Ovarian cancer Sister   . Prostate cancer Father   . Stroke Mother   . Kidney cancer Neg Hx   . Bladder Cancer Neg Hx     ADVANCED DIRECTIVES (Y/N):  N  HEALTH MAINTENANCE: Social History   Tobacco Use  . Smoking status: Never Smoker  . Smokeless tobacco: Never Used  Substance Use Topics    . Alcohol use: No  . Drug use: No     Colonoscopy:  PAP:  Bone density:  Lipid panel:  Allergies  Allergen Reactions  . Ciprofloxacin Hives  . Codeine Other (See Comments)    HALLUCINATIONS  . Tape     Rash and skin irritation/ paper tape and tegaderm OK  . Ciprocinonide [Fluocinolone] Other (See Comments)    unknown  . Amoxicillin Other (See Comments)    Upset stomach Has patient had a PCN reaction causing immediate rash, facial/tongue/throat swelling, SOB or lightheadedness with hypotension: No Has patient had a PCN reaction causing severe rash involving mucus membranes or skin necrosis: No Has patient had a PCN reaction that required hospitalization: No Has patient had a PCN reaction occurring within the last 10 years: Yes If all of the above answers are "NO", then may proceed with Cephalosporin use.;  . Cefuroxime Other (See Comments)    OK INTRACAMERALLY PER DR WLP, upset stomach  . Lipitor [Atorvastatin] Other (See Comments)    unknown    Current Outpatient Medications  Medication Sig Dispense Refill  . enoxaparin (LOVENOX) 80 MG/0.8ML injection Inject 0.75 mLs (75 mg total) into the skin every 12 (twelve) hours for 14 days. 21 mL 0  . fenofibrate 160 MG tablet TAKE ONE TABLET BY MOUTH EVERY DAY    .  GLIPIZIDE XL 2.5 MG 24 hr tablet Take 2.5 mg by mouth daily.     Marland Kitchen letrozole (FEMARA) 2.5 MG tablet Take 1 tablet (2.5 mg total) by mouth daily. 30 tablet 3  . loratadine (CLARITIN) 10 MG tablet Take 10 mg by mouth daily.    . metFORMIN (GLUCOPHAGE) 500 MG tablet Take 1,000 mg by mouth 2 (two) times daily.     . metoprolol succinate (TOPROL-XL) 25 MG 24 hr tablet Take 12.5 mg by mouth at bedtime.     . Multiple Vitamin (MULTIVITAMIN WITH MINERALS) TABS tablet Take 1 tablet by mouth daily.    Marland Kitchen nystatin (MYCOSTATIN/NYSTOP) powder Apply 1 application topically. Apply to right groin daily    . omeprazole (PRILOSEC) 40 MG capsule Take 40 mg by mouth daily.     Marland Kitchen PARoxetine  (PAXIL) 40 MG tablet Take 40 mg by mouth daily.     Vladimir Faster Glycol-Propyl Glycol 0.4-0.3 % SOLN Apply to eye 2 (two) times daily as needed.    . raloxifene (EVISTA) 60 MG tablet Take 60 mg by mouth daily.     . dabigatran (PRADAXA) 150 MG CAPS capsule Take 1 capsule (150 mg total) by mouth 2 (two) times daily. Start treatment 10 hrs last dose of lovenox. 60 capsule 5   No current facility-administered medications for this visit.    OBJECTIVE: Vitals:   08/02/19 1328  BP: (!) 149/56  Pulse: 97  Resp: 18  Temp: (!) 97.1 F (36.2 C)  SpO2: 92%     Body mass index is 33.2 kg/m.    ECOG FS:0 - Asymptomatic  General: Well-developed, well-nourished, no acute distress.  Sitting in a wheelchair. Eyes: Pink conjunctiva, anicteric sclera. HEENT: Normocephalic, moist mucous membranes. Lungs: No audible wheezing or coughing. Heart: Regular rate and rhythm. Abdomen: Soft, nontender, no obvious distention. Musculoskeletal: No edema, cyanosis, or clubbing.  Left leg amputation. Neuro: Alert, answering all questions appropriately. Cranial nerves grossly intact. Skin: No rashes or petechiae noted. Psych: Normal affect.  LAB RESULTS:  Lab Results  Component Value Date   NA 137 07/25/2019   K 3.7 07/25/2019   CL 110 07/25/2019   CO2 22 07/25/2019   GLUCOSE 175 (H) 07/25/2019   BUN 12 07/25/2019   CREATININE 0.55 07/26/2019   CALCIUM 9.6 07/25/2019   PROT 5.6 (L) 07/24/2019   ALBUMIN 2.8 (L) 07/24/2019   AST 17 07/24/2019   ALT 9 07/24/2019   ALKPHOS 35 (L) 07/24/2019   BILITOT 0.8 07/24/2019   GFRNONAA >60 07/26/2019   GFRAA >60 07/26/2019    Lab Results  Component Value Date   WBC 9.2 07/27/2019   NEUTROABS 5.1 07/19/2019   HGB 7.8 (L) 07/27/2019   HCT 27.7 (L) 07/27/2019   MCV 74.3 (L) 07/27/2019   PLT 214 07/27/2019     STUDIES: Abdomen 1 view (KUB)  Result Date: 07/14/2019 CLINICAL DATA:  Kidney stones, history of nephrolithiasis. EXAM: ABDOMEN - 1 VIEW  COMPARISON:  03/18/2018 FINDINGS: Nephrolithiasis on the right the least 4-5 calculi noted largest approximately 6 mm. Stool and gas overlie the left renal contour. Calcific density projecting over the medial aspect of the renal contour approximately 5 mm, potentially in the left renal pelvis. Lung bases are clear. Signs of left hemipelvectomy and spinal degenerative change. Scattered loops of gas-filled small bowel with gas and stool throughout the colon. No signs of obstruction. IMPRESSION: 1. Multiple right-sided renal calculi, largest approximately 5-6 mm. 2. Possible 5 mm left renal calculus. 3. Nonobstructive  bowel gas pattern. 4. Changes of left hemipelvectomy. Electronically Signed   By: Zetta Bills M.D.   On: 07/14/2019 08:36   CT Chest W Contrast  Result Date: 07/23/2019 CLINICAL DATA:  Follow-up recent splenic infarcts EXAM: CT CHEST, ABDOMEN, AND PELVIS WITH CONTRAST TECHNIQUE: Multidetector CT imaging of the chest, abdomen and pelvis was performed following the standard protocol during bolus administration of intravenous contrast. CONTRAST:  137m OMNIPAQUE IOHEXOL 300 MG/ML  SOLN COMPARISON:  CT from the same day. FINDINGS: CT CHEST FINDINGS Cardiovascular: The heart size is mildly enlarged. No significant pericardial effusion. Atherosclerotic changes are noted of the thoracic aorta without evidence for an aneurysm or dissection. The main pulmonary artery is mildly dilated. There is no large centrally located pulmonary embolism. Mediastinum/Nodes: --No mediastinal or hilar lymphadenopathy. --No axillary lymphadenopathy. --No supraclavicular lymphadenopathy. --Normal thyroid gland. --there is a right-sided thyroid nodule measuring approximately 2.8 cm. This appears relatively stable since 2015. Lungs/Pleura: There is a pulmonary nodule in the right upper lobe measuring approximately 5 mm (axial series 4, image 39). This is essentially stable since 2015. There are multiple small subcentimeter  pulmonary nodules in the right middle lobe and right lower lobe that are essentially stable since prior study. There is a stable 1 cm pulmonary nodule in the left lower lobe there is some atelectasis at the lung bases. There is no significant pleural effusion. There is no pneumothorax. Musculoskeletal: No chest wall abnormality. No acute or significant osseous findings. CT ABDOMEN PELVIS FINDINGS Hepatobiliary: There is decreased hepatic attenuation suggestive of hepatic steatosis. Status post cholecystectomy.There is no biliary ductal dilation. Pancreas: Normal contours without ductal dilatation. No peripancreatic fluid collection. Spleen: There are multiple wedge-shaped hypoattenuating defects in the spleen, likely involving approximately 40-50% of the splenic parenchyma. There is no perisplenic hematoma. No evidence for active extravasation. Adrenals/Urinary Tract: --Adrenal glands: No adrenal hemorrhage. --Right kidney/ureter: Multiple nonobstructing stones are noted involving the right kidney. --Left kidney/ureter: There are nonobstructing stones involving the left kidney. --Urinary bladder: Unremarkable. Stomach/Bowel: --Stomach/Duodenum: No hiatal hernia or other gastric abnormality. Normal duodenal course and caliber. --Small bowel: No dilatation or inflammation. --Colon: Rectosigmoid diverticulosis without acute inflammation. --Appendix: Not visualized. No right lower quadrant inflammation or free fluid. Vascular/Lymphatic: Atherosclerotic calcification is present within the non-aneurysmal abdominal aorta, without hemodynamically significant stenosis. The splenic artery is grossly patent. --No retroperitoneal lymphadenopathy. --No mesenteric lymphadenopathy. --No pelvic or inguinal lymphadenopathy. Reproductive: There is a 2.1 cm cystic structure involving the left ovary. This is relatively stable since 2020 and is consistent with a benign process. Multiple calcified fibroids are noted. Other: No ascites or  free air. The abdominal wall is normal. Musculoskeletal. 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 IMPRESSION: 1. Multiple wedge-shaped hypoattenuating defects in the spleen, likely involving approximately 40-50% of the splenic parenchyma. 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 The splenic artery is grossly patent. 2. There is scattered bilateral pulmonary nodules, all of which appear relatively stable since 2015. No further follow-up is required. 3. Mild cardiomegaly. 4. Bilateral nonobstructing nephrolithiasis. 5. Rectosigmoid diverticulosis. 6. There is a 2.8 cm right-sided thyroid nodule which is essentially stable since 2015. No follow-up recommended unless clinically warranted (ref: J Am Coll Radiol. 2015 Feb;12(2): 143-50). 7. Aortic Atherosclerosis (ICD10-I70.0). Electronically Signed   By: CConstance HolsterM.D.   On: 07/23/2019 15:39   CT ABDOMEN PELVIS W CONTRAST  Result Date: 07/23/2019 CLINICAL DATA:  Follow-up recent splenic infarcts EXAM: CT CHEST,  ABDOMEN, AND PELVIS WITH CONTRAST TECHNIQUE: Multidetector CT imaging of the chest, abdomen and pelvis was performed following the standard protocol during bolus administration of intravenous contrast. CONTRAST:  115m OMNIPAQUE IOHEXOL 300 MG/ML  SOLN COMPARISON:  CT from the same day. FINDINGS: CT CHEST FINDINGS Cardiovascular: The heart size is mildly enlarged. No significant pericardial effusion. Atherosclerotic changes are noted of the thoracic aorta without evidence for an aneurysm or dissection. The main pulmonary artery is mildly dilated. There is no large centrally located pulmonary embolism. Mediastinum/Nodes: --No mediastinal or hilar lymphadenopathy. --No axillary lymphadenopathy. --No supraclavicular lymphadenopathy. --Normal thyroid gland. --there is a right-sided thyroid nodule measuring approximately 2.8 cm. This appears relatively stable since 2015. Lungs/Pleura: There  is a pulmonary nodule in the right upper lobe measuring approximately 5 mm (axial series 4, image 39). This is essentially stable since 2015. There are multiple small subcentimeter pulmonary nodules in the right middle lobe and right lower lobe that are essentially stable since prior study. There is a stable 1 cm pulmonary nodule in the left lower lobe there is some atelectasis at the lung bases. There is no significant pleural effusion. There is no pneumothorax. Musculoskeletal: No chest wall abnormality. No acute or significant osseous findings. CT ABDOMEN PELVIS FINDINGS Hepatobiliary: There is decreased hepatic attenuation suggestive of hepatic steatosis. Status post cholecystectomy.There is no biliary ductal dilation. Pancreas: Normal contours without ductal dilatation. No peripancreatic fluid collection. Spleen: There are multiple wedge-shaped hypoattenuating defects in the spleen, likely involving approximately 40-50% of the splenic parenchyma. There is no perisplenic hematoma. No evidence for active extravasation. Adrenals/Urinary Tract: --Adrenal glands: No adrenal hemorrhage. --Right kidney/ureter: Multiple nonobstructing stones are noted involving the right kidney. --Left kidney/ureter: There are nonobstructing stones involving the left kidney. --Urinary bladder: Unremarkable. Stomach/Bowel: --Stomach/Duodenum: No hiatal hernia or other gastric abnormality. Normal duodenal course and caliber. --Small bowel: No dilatation or inflammation. --Colon: Rectosigmoid diverticulosis without acute inflammation. --Appendix: Not visualized. No right lower quadrant inflammation or free fluid. Vascular/Lymphatic: Atherosclerotic calcification is present within the non-aneurysmal abdominal aorta, without hemodynamically significant stenosis. The splenic artery is grossly patent. --No retroperitoneal lymphadenopathy. --No mesenteric lymphadenopathy. --No pelvic or inguinal lymphadenopathy. Reproductive: There is a 2.1 cm  cystic structure involving the left ovary. This is relatively stable since 2020 and is consistent with a benign process. Multiple calcified fibroids are noted. Other: No ascites or free air. The abdominal wall is normal. Musculoskeletal. 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 IMPRESSION: 1. Multiple wedge-shaped hypoattenuating defects in the spleen, likely involving approximately 40-50% of the splenic parenchyma. 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 The splenic artery is grossly patent. 2. There is scattered bilateral pulmonary nodules, all of which appear relatively stable since 2015. No further follow-up is required. 3. Mild cardiomegaly. 4. Bilateral nonobstructing nephrolithiasis. 5. Rectosigmoid diverticulosis. 6. There is a 2.8 cm right-sided thyroid nodule which is essentially stable since 2015. No follow-up recommended unless clinically warranted (ref: J Am Coll Radiol. 2015 Feb;12(2): 143-50). 7. Aortic Atherosclerosis (ICD10-I70.0). Electronically Signed   By: CConstance HolsterM.D.   On: 07/23/2019 15:39   ECHOCARDIOGRAM COMPLETE  Result Date: 07/24/2019    ECHOCARDIOGRAM REPORT   Patient Name:   ETOREE EDLINGDate of Exam: 07/24/2019 Medical Rec #:  [id number]      Height:       60.0 in Accession #:    [id number]     Weight:  161.0 lb Date of Birth:  12/26/1933      BSA:          1.702 m Patient Age:    47 years        BP:           138/56 mmHg Patient Gender: F               HR:           119 bpm. Exam Location:  [facility] Procedure: 2D Echo Indications:     FEVER 780.6/R50.9  History:         Patient has no prior history of Echocardiogram examinations.                  Risk Factors:Diabetes, Hypertension and Dyslipidemia.  Sonographer:     Avanell Shackleton Referring Phys:  [id number] Nolberto Hanlon Diagnosing Phys: Yolonda Kida MD IMPRESSIONS  1. Left ventricular ejection fraction, by estimation, is 70 to 75%. The left  ventricle has hyperdynamic function. The left ventricle has no regional wall motion abnormalities. There is mild concentric left ventricular hypertrophy. Left ventricular diastolic parameters are consistent with Grade I diastolic dysfunction (impaired relaxation).  2. Right ventricular systolic function is normal. The right ventricular size is normal. There is moderately elevated pulmonary artery systolic pressure.  3. The mitral valve is grossly normal. Trivial mitral valve regurgitation.  4. The aortic valve is abnormal. Aortic valve regurgitation is not visualized. FINDINGS  Left Ventricle: Left ventricular ejection fraction, by estimation, is 70 to 75%. The left ventricle has hyperdynamic function. The left ventricle has no regional wall motion abnormalities. The left ventricular internal cavity size was normal in size. There is mild concentric left ventricular hypertrophy. Left ventricular diastolic parameters are consistent with Grade I diastolic dysfunction (impaired relaxation). Right Ventricle: The right ventricular size is normal. No increase in right ventricular wall thickness. Right ventricular systolic function is normal. There is moderately elevated pulmonary artery systolic pressure. The tricuspid regurgitant velocity is 3.28 m/s, and with an assumed right atrial pressure of 10 mmHg, the estimated right ventricular systolic pressure is 09.3 mmHg. Left Atrium: Left atrial size was normal in size. Right Atrium: Right atrial size was normal in size. Pericardium: There is no evidence of pericardial effusion. Mitral Valve: The mitral valve is grossly normal. Trivial mitral valve regurgitation. Tricuspid Valve: The tricuspid valve is normal in structure. Tricuspid valve regurgitation is mild. Aortic Valve: The aortic valve is abnormal. Aortic valve regurgitation is not visualized. Pulmonic Valve: The pulmonic valve was normal in structure. Pulmonic valve regurgitation is not visualized. Aorta: The aortic  root is normal in size and structure. IAS/Shunts: No atrial level shunt detected by color flow Doppler.  LEFT VENTRICLE PLAX 2D LVIDd:         4.54 cm LVIDs:         2.32 cm LV PW:         0.82 cm LV IVS:        0.91 cm LVOT diam:     1.90 cm LVOT Area:     2.84 cm  LEFT ATRIUM           Index LA diam:      4.80 cm 2.82 cm/m LA Vol (A4C): 77.5 ml 45.53 ml/m   AORTA Ao Root diam: 2.80 cm MITRAL VALVE                TRICUSPID VALVE MV Area (PHT): 2.17 cm  TR Peak grad:   43.0 mmHg MV Decel Time: 350 msec     TR Vmax:        328.00 cm/s MV E velocity: 110.00 cm/s MV A velocity: 176.00 cm/s  SHUNTS MV E/A ratio:  0.62         Systemic Diam: 1.90 cm Yolonda Kida MD Electronically signed by Yolonda Kida MD Signature Date/Time: 07/24/2019/11:40:35 PM    Final    CT Renal Stone Study  Result Date: 07/23/2019 CLINICAL DATA:  Pt arrives via EMS from home ([facility]) after having L flank pain since Wed that radiates to abdomen and back- pt has a hx of kidney infection and states that this feels similar EXAM: CT ABDOMEN AND PELVIS WITHOUT CONTRAST TECHNIQUE: Multidetector CT imaging of the abdomen and pelvis was performed following the standard protocol without IV contrast. COMPARISON:  CT abdomen pelvis 11/25/2018 FINDINGS: The most lateral aspect of the left lateral abdominal wall is excluded from Acosta of view. Lower chest: Bibasilar atelectasis. Solid and ground-glass nodules in the right lung base appear stable. There is a 1.2 cm nodule in the left lung base which appears mildly increased in size compared to the prior study. Hepatobiliary: No focal liver abnormality is seen. No gallstones, gallbladder wall thickening, or biliary dilatation. Pancreas: Unremarkable. Spleen: New multiple areas of confluent hypoattenuation in the spleen. Spleen is normal in size. Adrenals/Urinary Tract: Adrenal glands are unremarkable. There are punctate calculi in the bilateral kidneys. No  hydronephrosis. Stomach/Bowel: Stomach is within normal limits. No evidence of bowel wall thickening, distention, or inflammatory changes. Multiple colonic diverticula without evidence of diverticulitis. Vascular/Lymphatic: Moderate aortoiliac atherosclerotic calcification without aneurysm. Vascular patency cannot be assessed in the absence of IV contrast. No lymphadenopathy. Reproductive: Fibroid uterus. Bilateral adnexa unremarkable. Other: No abdominal wall hernia or abnormality. No abdominopelvic ascites. Musculoskeletal: Left hemipelvectomy. Degenerative changes in the right sacroiliac joint. IMPRESSION: 1. New multiple areas of confluent hypoattenuation in the spleen are nonspecific but differential includes splenic infarcts and metastatic disease. Contrast-enhanced evaluation is recommended. 2. Bilateral punctate nephrolithiasis without hydronephrosis. 3. Colonic diverticulosis without evidence of diverticulitis. 4. There is a 1.2 cm nodule in the left lung base which appears mildly increased in size compared to the prior study. Nonemergent chest CT is recommended for further evaluation. Aortic Atherosclerosis (ICD10-I70.0). Electronically Signed   By: Audie Pinto M.D.   On: 07/23/2019 14:11   ECHO TEE  Result Date: 07/25/2019    TRANSESOPHOGEAL ECHO REPORT   Patient Name:   STEPHAN DRAUGHN Date of Exam: 07/25/2019 Medical Rec #:  [id number]       Height:       60.0 in Accession #:    [id number]      Weight:       160.9 lb Date of Birth:  1933/05/09      BSA:          1.702 m Patient Age:    44 years        BP:           97/39 mmHg Patient Gender: F               HR:           96 bpm. Exam Location:  [facility] Procedure: Transesophageal Echo, Color Doppler and Cardiac Doppler Indications:     SBE-subacute bacterial endocarditis 421.0  History:         Patient has prior history of Echocardiogram examinations, most  recent 07/24/2019. High blood pressure.  Sonographer:     Sherrie Sport RDCS  (AE) Referring Phys:  Hurt Diagnosing Phys: Serafina Royals MD PROCEDURE: The transesophogeal probe was passed without difficulty through the esophogus of the patient. Sedation performed by performing physician. The patient developed no complications during the procedure. IMPRESSIONS  1. Left ventricular ejection fraction, by estimation, is 55 to 60%. The left ventricle has normal function. The left ventricle has no regional wall motion abnormalities.  2. Right ventricular systolic function is normal. The right ventricular size is normal.  3. Left atrial size was mild to moderately dilated. No left atrial/left atrial appendage thrombus was detected.  4. The mitral valve is normal in structure. Mild to moderate mitral valve regurgitation.  5. The aortic valve is normal in structure. Aortic valve regurgitation is trivial.  6. There is mild (Grade II) plaque. FINDINGS  Left Ventricle: Left ventricular ejection fraction, by estimation, is 55 to 60%. The left ventricle has normal function. The left ventricle has no regional wall motion abnormalities. The left ventricular internal cavity size was normal in size. There is  no left ventricular hypertrophy. Right Ventricle: The right ventricular size is normal. No increase in right ventricular wall thickness. Right ventricular systolic function is normal. Left Atrium: Left atrial size was mild to moderately dilated. No left atrial/left atrial appendage thrombus was detected. Right Atrium: Right atrial size was normal in size. Pericardium: There is no evidence of pericardial effusion. Mitral Valve: The mitral valve is normal in structure. Mild to moderate mitral valve regurgitation. There is no evidence of mitral valve vegetation. Tricuspid Valve: The tricuspid valve is normal in structure. Tricuspid valve regurgitation is mild. There is no evidence of tricuspid valve vegetation. Aortic Valve: The aortic valve is normal in structure. Aortic valve  regurgitation is trivial. There is no evidence of aortic valve vegetation. Pulmonic Valve: The pulmonic valve was grossly normal. Pulmonic valve regurgitation is not visualized. Aorta: The aortic root and ascending aorta are structurally normal, with no evidence of dilitation. There is mild (Grade II) plaque. IAS/Shunts: There is right bowing of the interatrial septum, suggestive of elevated left atrial pressure. No atrial level shunt detected by color flow Doppler. 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  Homozygous factor V Leiden.  PLAN:    1.  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  Patient had a lumpectomy on July 27, 2017.  She completed radiation with MammoSite treatment.  Continue letrozole for a total of 5 years completing treatment in May 2024.  Her most recent mammogram on June 21, 2019 was reported as BI-RADS 2.  Repeat in February 2022.  Return to clinic as previously scheduled in approximately 6 months. 2.  Osteopenia: Patient's most recent bone mineral density on October 13, 2018 reported T score of -2.1 which is improved from 1 year prior when the T score was reported -2.4. Patient refused Fosamax, but has agreed to Prolia every 6 months.  Proceed with Prolia today.  Continue calcium and vitamin D supplementation.  Return to clinic in approximately 6 months as above.  If patient's T score continues to improve,  can consider discontinuing treatment. 3.  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 4.  Homozygous factor V Leiden: Patient had multiple splenic infarcts suspicious for thrombosis while taking Xarelto.  Subsequent hypercoagulable work-up revealed the patient is homozygous for factor V Leiden.  She is currently on Lovenox twice a day.  She was given a prescription for Pradaxa 150 mg every 12 hours as well as a plan to transition off of Lovenox.  She will require lifelong anticoagulation.  Return to clinic as above.  Patient expressed understanding and was in agreement with this plan. She also understands that She can call clinic at any time with any questions, concerns, or complaints.   Cancer Staging Primary cancer of upper outer quadrant of left female breast [facility]) Staging form: Breast, AJCC 8th Edition - Clinical stage from 06/12/2017: Stage IA (cT1b, cN0, cM0, G2, ER: Positive, PR: Positive, HER2: Negative) - Signed by Lloyd Huger, MD on 06/12/2017   Lloyd Huger, MD   08/02/2019 2:52 PM

## 2019-08-01 DIAGNOSIS — D735 Infarction of spleen: Secondary | ICD-10-CM

## 2019-08-01 DIAGNOSIS — F39 Unspecified mood [affective] disorder: Secondary | ICD-10-CM

## 2019-08-01 DIAGNOSIS — I10 Essential (primary) hypertension: Secondary | ICD-10-CM

## 2019-08-01 DIAGNOSIS — E1159 Type 2 diabetes mellitus with other circulatory complications: Secondary | ICD-10-CM

## 2019-08-01 DIAGNOSIS — K219 Gastro-esophageal reflux disease without esophagitis: Secondary | ICD-10-CM

## 2019-08-01 LAB — FACTOR 5 LEIDEN

## 2019-08-02 ENCOUNTER — Encounter: Payer: Self-pay | Admitting: Oncology

## 2019-08-02 ENCOUNTER — Inpatient Hospital Stay (HOSPITAL_BASED_OUTPATIENT_CLINIC_OR_DEPARTMENT_OTHER): Payer: Medicare Other | Admitting: Oncology

## 2019-08-02 VITALS — BP 149/56 | HR 97 | Temp 97.1°F | Resp 18 | Wt 170.0 lb

## 2019-08-02 DIAGNOSIS — Z87442 Personal history of urinary calculi: Secondary | ICD-10-CM | POA: Diagnosis not present

## 2019-08-02 DIAGNOSIS — M8580 Other specified disorders of bone density and structure, unspecified site: Secondary | ICD-10-CM | POA: Diagnosis not present

## 2019-08-02 DIAGNOSIS — D689 Coagulation defect, unspecified: Secondary | ICD-10-CM

## 2019-08-02 DIAGNOSIS — Z7984 Long term (current) use of oral hypoglycemic drugs: Secondary | ICD-10-CM | POA: Diagnosis not present

## 2019-08-02 DIAGNOSIS — C50412 Malignant neoplasm of upper-outer quadrant of left female breast: Secondary | ICD-10-CM

## 2019-08-02 DIAGNOSIS — Z7901 Long term (current) use of anticoagulants: Secondary | ICD-10-CM | POA: Diagnosis not present

## 2019-08-02 DIAGNOSIS — Z79811 Long term (current) use of aromatase inhibitors: Secondary | ICD-10-CM | POA: Diagnosis not present

## 2019-08-02 DIAGNOSIS — Z79899 Other long term (current) drug therapy: Secondary | ICD-10-CM | POA: Diagnosis not present

## 2019-08-02 DIAGNOSIS — Z170 Estrogen receptor positive status [ER+]: Secondary | ICD-10-CM | POA: Diagnosis not present

## 2019-08-02 MED ORDER — DABIGATRAN ETEXILATE MESYLATE 150 MG PO CAPS: 150.0000 mg | ORAL_CAPSULE | Freq: Two times a day (BID) | ORAL | 5 refills | Status: DC

## 2019-08-22 ENCOUNTER — Ambulatory Visit: Payer: Medicare Other | Admitting: Podiatry

## 2019-08-25 ENCOUNTER — Other Ambulatory Visit: Payer: Self-pay | Admitting: *Deleted

## 2019-08-25 MED ORDER — LETROZOLE 2.5 MG PO TABS: 2.5000 mg | ORAL_TABLET | Freq: Every day | ORAL | 3 refills | Status: DC

## 2019-08-25 NOTE — Telephone Encounter (Signed)
Patient states that the hospital provider cnl pt's femara on discharge from [facility]. She does not understand why this occurred.   Please clarify if patient should remain on femara. If so, she will need a RF.

## 2019-08-25 NOTE — Telephone Encounter (Signed)
Spoke with patient. Informed patient to continue taking her femara. She is aware that new script was sent to pharmacy.

## 2019-08-31 ENCOUNTER — Telehealth: Payer: Self-pay | Admitting: *Deleted

## 2019-08-31 NOTE — Telephone Encounter (Signed)
Call was returned to and advised her that we will mail her a copy of Dr Virgel Manifold last office note so that she has in writing as requested her diagnosis and whatr medications she will be taking for it

## 2019-08-31 NOTE — Telephone Encounter (Signed)
Patient called asking what her diagnosis was for being in the hospital and what medications she is supposed to be taking and why she is to take them. Per consult note from March, she was to follow up in cancer center 1-2 weeks, but she does not have an appointment until September. Please Julie Acosta

## 2019-08-31 NOTE — Telephone Encounter (Signed)
Pt was seen on 3/30 for Hospital follow-up and her checkout note stated follow-up as scheduled.

## 2019-08-31 NOTE — Telephone Encounter (Signed)
My note from 3/30 seems pretty clear, but  she can come in next week to discuss if there is confusion.

## 2019-09-08 ENCOUNTER — Other Ambulatory Visit: Payer: Self-pay | Admitting: *Deleted

## 2019-09-08 ENCOUNTER — Telehealth: Payer: Self-pay | Admitting: Emergency Medicine

## 2019-09-08 DIAGNOSIS — D689 Coagulation defect, unspecified: Secondary | ICD-10-CM

## 2019-09-08 MED ORDER — DABIGATRAN ETEXILATE MESYLATE 150 MG PO CAPS: 150.0000 mg | ORAL_CAPSULE | Freq: Two times a day (BID) | ORAL | 5 refills | Status: DC

## 2019-09-08 NOTE — Telephone Encounter (Signed)
I'll see what I can find, but in the mean time is there [place] or free drug?

## 2019-09-08 NOTE — Telephone Encounter (Signed)
Returned call to patient after receiving voicemail that pt was requesting Dr. Grayland Ormond call her insurance company to "approve" her Pradaxa prescription so that she can get it filled as she only has two pills left. Called pt back and explained to pt that yesterday we had filled out a PA but that had been denied and we had started the appeals process, but hadn't heard anything about if that was going to be approved or denied yet. Pt expressed concern due to it being for her clotting disorder, and the cost of the prescription without insurance is $486 a month and she cannot afford that. Asking if there's something else that she could take instead of the Pradaxa or if we could offer some financial assistance while we're waiting to hear from the insurance company. Please advise.

## 2019-09-09 MED ORDER — DABIGATRAN ETEXILATE MESYLATE 150 MG PO CAPS: 150.0000 mg | ORAL_CAPSULE | Freq: Two times a day (BID) | ORAL | 0 refills | Status: DC

## 2019-09-09 NOTE — Telephone Encounter (Signed)
Oral Chemotherapy Pharmacist Encounter   I was able to sign Julie Acosta up for a 30-day free, one-time, trial card for her Pradaxa online. [name] usual pharmacy is unable to use these types of vouchers, so I redirected the prescription [facility] a [facility]. [facility] and provided them with the voucher information and they were able to process the prescription for a $0 copay. Called Julie Acosta to let her know also informed her daughter Julie Acosta. Her daughter plans on picking it up today.  They know that the office is still waiting to hear back from her insurance.  Pradaxa 30-day free Voucher ID: [id number] BIN: [id number] Group: [id number] PCN: Lyndel Pleasure, PharmD, BCPS, BCOP, CPP Hematology/Oncology Clinical Pharmacist [facility] [phone]  09/09/2019 10:26 AM

## 2019-10-02 ENCOUNTER — Other Ambulatory Visit: Payer: Self-pay | Admitting: Oncology

## 2019-10-02 DIAGNOSIS — D689 Coagulation defect, unspecified: Secondary | ICD-10-CM

## 2019-10-06 ENCOUNTER — Encounter: Payer: Self-pay | Admitting: *Deleted

## 2019-10-06 ENCOUNTER — Telehealth: Payer: Self-pay | Admitting: *Deleted

## 2019-10-06 NOTE — Telephone Encounter (Signed)
Call placed to [employer] regarding status of prior authorization. Requests for coverage have been denied, I have submitted letter of appeal and urgent request for approval considering patient is out of medication on Sunday. Per [employer] appeal could take up to 3 days.

## 2019-10-06 NOTE — Telephone Encounter (Signed)
Left message on voice mail regarding status of appeal

## 2019-10-06 NOTE — Telephone Encounter (Signed)
Patient POA Julie Acosta called asking if we were able to get the prior authorization approved for patient Julie Acosta or not. Patient only has enough medicine until Sunday

## 2019-10-07 ENCOUNTER — Telehealth: Payer: Self-pay | Admitting: *Deleted

## 2019-10-07 NOTE — Telephone Encounter (Signed)
Julie Acosta is working on the pradaxa (again).  I am only here 3 days next week and am pretty packed in. She may need to see an NP.

## 2019-10-07 NOTE — Telephone Encounter (Addendum)
Patient states it looks like she has blood clots on the inside of her right leg. Sometimes painful and awakens her at night at times, throbbing pain. It is red bumpy Asking if she needs to come in to be seen about this.  Insurance has denied the Pradaxa and she paid $468 for it yesterday. She reports that she does not need to come today, but would like to see Dr Grayland Ormond next week if at all possible. Please advise.

## 2019-10-13 ENCOUNTER — Inpatient Hospital Stay: Payer: Medicare Other | Attending: Oncology | Admitting: Oncology

## 2019-10-13 ENCOUNTER — Ambulatory Visit: Payer: Medicare Other

## 2019-10-13 ENCOUNTER — Ambulatory Visit
Admission: RE | Admit: 2019-10-13 | Discharge: 2019-10-13 | Disposition: A | Discharge status: Home / Self Care | Payer: Medicare Other | Source: Ambulatory Visit | Attending: Oncology | Admitting: Oncology

## 2019-10-13 ENCOUNTER — Other Ambulatory Visit: Payer: Self-pay

## 2019-10-13 ENCOUNTER — Encounter: Payer: Self-pay | Admitting: Oncology

## 2019-10-13 ENCOUNTER — Encounter (INDEPENDENT_AMBULATORY_CARE_PROVIDER_SITE_OTHER): Payer: Self-pay

## 2019-10-13 ENCOUNTER — Telehealth: Payer: Self-pay | Admitting: *Deleted

## 2019-10-13 VITALS — BP 122/42 | HR 71 | Temp 97.8°F | Resp 18

## 2019-10-13 DIAGNOSIS — Z853 Personal history of malignant neoplasm of breast: Secondary | ICD-10-CM | POA: Diagnosis not present

## 2019-10-13 DIAGNOSIS — Z7901 Long term (current) use of anticoagulants: Secondary | ICD-10-CM | POA: Insufficient documentation

## 2019-10-13 DIAGNOSIS — Z8589 Personal history of malignant neoplasm of other organs and systems: Secondary | ICD-10-CM | POA: Diagnosis not present

## 2019-10-13 DIAGNOSIS — Z86718 Personal history of other venous thrombosis and embolism: Secondary | ICD-10-CM | POA: Diagnosis not present

## 2019-10-13 DIAGNOSIS — Z7984 Long term (current) use of oral hypoglycemic drugs: Secondary | ICD-10-CM | POA: Insufficient documentation

## 2019-10-13 DIAGNOSIS — D6851 Activated protein C resistance: Secondary | ICD-10-CM | POA: Diagnosis present

## 2019-10-13 DIAGNOSIS — Z85828 Personal history of other malignant neoplasm of skin: Secondary | ICD-10-CM | POA: Insufficient documentation

## 2019-10-13 DIAGNOSIS — E119 Type 2 diabetes mellitus without complications: Secondary | ICD-10-CM | POA: Diagnosis not present

## 2019-10-13 DIAGNOSIS — Z8042 Family history of malignant neoplasm of prostate: Secondary | ICD-10-CM | POA: Diagnosis not present

## 2019-10-13 DIAGNOSIS — Z8041 Family history of malignant neoplasm of ovary: Secondary | ICD-10-CM | POA: Insufficient documentation

## 2019-10-13 DIAGNOSIS — F419 Anxiety disorder, unspecified: Secondary | ICD-10-CM | POA: Diagnosis not present

## 2019-10-13 DIAGNOSIS — I129 Hypertensive chronic kidney disease with stage 1 through stage 4 chronic kidney disease, or unspecified chronic kidney disease: Secondary | ICD-10-CM | POA: Insufficient documentation

## 2019-10-13 DIAGNOSIS — D689 Coagulation defect, unspecified: Secondary | ICD-10-CM

## 2019-10-13 DIAGNOSIS — K219 Gastro-esophageal reflux disease without esophagitis: Secondary | ICD-10-CM | POA: Insufficient documentation

## 2019-10-13 DIAGNOSIS — M810 Age-related osteoporosis without current pathological fracture: Secondary | ICD-10-CM | POA: Diagnosis not present

## 2019-10-13 DIAGNOSIS — Z79899 Other long term (current) drug therapy: Secondary | ICD-10-CM | POA: Insufficient documentation

## 2019-10-13 DIAGNOSIS — Z923 Personal history of irradiation: Secondary | ICD-10-CM | POA: Insufficient documentation

## 2019-10-13 DIAGNOSIS — Z803 Family history of malignant neoplasm of breast: Secondary | ICD-10-CM | POA: Diagnosis not present

## 2019-10-13 DIAGNOSIS — E785 Hyperlipidemia, unspecified: Secondary | ICD-10-CM | POA: Insufficient documentation

## 2019-10-13 DIAGNOSIS — N189 Chronic kidney disease, unspecified: Secondary | ICD-10-CM | POA: Diagnosis not present

## 2019-10-13 NOTE — Telephone Encounter (Signed)
Call placed to pt regarding [place] results, negative for acute DVT. Pt verbalized understanding of results. Pt to follow up with vascular per request from Rulon Abide, NP. Pt has previously scheduled f/u with Dr. Delana Meyer on 6/24, she will keep that appt at this time. I left vm for Caren Griffins in appeals dept at [facility] regarding appeal/status for pradaxa as patient had to pay $486 out of pocket for recent RX. I have also spoken with Alyson regarding pts prescription and she is going to follow up on status of appeal as well.

## 2019-10-13 NOTE — Progress Notes (Signed)
Symptom Management Consult note [facility]  Telephone:([phone] [phone]  Patient Care Team: [place] Pink, MD as PCP - General (Family Medicine) Bary Castilla Forest Gleason, MD (General Surgery)   Name of the patient: Julie Acosta  [id number]  10/21/1933   Date of visit: 10/13/2019   Diagnosis- 1. Coagulation disorder ([place]) - US Venous Img Lower Unilateral Right; Future   Chief complaint/ Reason for visit- Right leg swelling  Heme/Onc history:  Oncology History   No history exists.   Interval history-Mrs. 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  She was admitted to the hospital on 07/23/2019 for flank pain and found to have multiple splenic infarcts.  She was treated with a heparin drip and discharged home on subcutaneous Lovenox.  She had been on Xarelto for DVT in the past.  She met with Dr. Grayland Ormond who performed a hypercoagulable work-up which revealed homozygous factor V Leiden.  She was switched from Lovenox twice daily to Pradaxa twice daily.  Today, patient complains of right lower extremity swelling and tenderness to anterior side of her knee.  She has been compliant with her Pradaxa.  She has tried compression stockings and heating pads without relief.  She has also been taking 1000 mg Tylenol daily.  She denies any shortness of breath, cough or chest pain.  Has occasional left lower quadrant discomfort that she noticed several days ago.  This sensation comes and goes.  She is having good bowel movements and denies any constipation or diarrhea.  She denies nausea or vomiting.  She denies any recent fevers or illness.  ECOG FS:2 - Symptomatic, <50% confined to  bed  Review of systems- Review of Systems  Constitutional: Negative.  Negative for chills, fever, malaise/fatigue and weight loss.  HENT: Negative for congestion, ear pain and tinnitus.   Eyes: Negative.  Negative for blurred vision and double vision.  Respiratory: Negative.  Negative for cough, sputum production and shortness of breath.   Cardiovascular: Positive for leg swelling (RLE). Negative for chest pain and palpitations.  Gastrointestinal: Positive for abdominal pain (LLQ). Negative for constipation, diarrhea, nausea and vomiting.  Genitourinary: Negative for dysuria, frequency and urgency.  Musculoskeletal: Negative for back pain and falls.  Skin: Negative.  Negative for rash.  Neurological: Negative.  Negative for weakness and headaches.  Endo/Heme/Allergies: Negative.  Does not bruise/bleed easily.  Psychiatric/Behavioral: Negative.  Negative for depression. The patient is not nervous/anxious and does not have insomnia.      Current treatment-Pradaxa twice daily  Allergies  Allergen Reactions  . Ciprofloxacin Hives  . Codeine Other (See Comments)    HALLUCINATIONS  . Tape     Rash and skin irritation/ paper tape and tegaderm OK  . Ciprocinonide [Fluocinolone] Other (See Comments)    unknown  . Amoxicillin Other (See Comments)    Upset stomach Has patient had a PCN reaction causing immediate rash, facial/tongue/throat swelling, SOB or lightheadedness with hypotension: No Has patient had a PCN reaction causing severe rash involving mucus membranes or skin necrosis: No Has patient had a PCN reaction that required hospitalization: No Has patient had a PCN reaction occurring within the last 10 years: Yes If all of the above answers are "NO", then may proceed with Cephalosporin use.;  .  Cefuroxime Other (See Comments)    OK INTRACAMERALLY PER DR WLP, upset stomach  . Lipitor [Atorvastatin] Other (See Comments)    unknown     Past Medical History:  Diagnosis Date  .  Anxiety   . Benign breast lumps   . Blood clotting disorder ([place])   . Bone cancer ([facility])    head of femur, left leg ... amputation at age 64  . Breast cancer of upper-outer quadrant of left female breast ([place]) 07/27/2017   11 mm invasive mammary carcinoma, ER 90%, PR 51-90%, negative margins.  Oncotype recurrence score: 1.  DCIS, margin less than 0.5 mm.  Negative sentinel node.  Accelerated partial breast radiation.  . Cervicalgia   . Chronic kidney disease    kidney stones  . Colon cancer [facility])    patient unaware of this  . Complication of anesthesia    mood alteration / not sure if d/t pain medicine or anesthesia as she passed out   . Cough    NASAL DRIP / SNEEZING / SORE THROAT MOSTLY CONSTANT  . Depression   . Diabetes ([place])   . Diverticulitis   . Dizzy   . GERD (gastroesophageal reflux disease)   . H/O blood clots    arm and leg  . HBP (high blood pressure)   . Hematuria    gross  . Hemorrhoids   . HLD (hyperlipidemia)   . Muscle pain   . Osteoarthritis   . Osteoporosis   . Panic disorder   . Personal history of radiation therapy   . PND (post-nasal drip)    CHRONIC WITH SORE THROAT AND SNEEZING  . Reflux   . Skin cancer    nose  . Swelling   . Tremor   . Tremors of nervous system      Past Surgical History:  Procedure Laterality Date  . APPENDECTOMY  1962  . BREAST BIOPSY Right 2006?   benign  . BREAST BIOPSY Left 2019   invasive mammary carcinoma  . BREAST CYST EXCISION Left 07/27/2017   Procedure: SKIN CYST EXCISED;  Surgeon: Robert Bellow, MD;  Location: [facility] ORS;  Service: General;  Laterality: Left;  . BREAST LUMPECTOMY Left 07/2017   invasive mammary, DCIS  mammosite  . BREAST SURGERY Right 2019  . CARPAL TUNNEL RELEASE Right   . CATARACT EXTRACTION W/PHACO Left 11/11/2016   Procedure: CATARACT EXTRACTION PHACO AND INTRAOCULAR LENS PLACEMENT (IOC);  Surgeon: Birder Robson, MD;  Location: [facility] ORS;  Service: Ophthalmology;  Laterality: Left;   [place] 01:07.9 AP% 19.2 CDE 13.02 Fluid pack lot # [id number]  . CATARACT EXTRACTION W/PHACO Right 12/09/2016   Procedure: CATARACT EXTRACTION PHACO AND INTRAOCULAR LENS PLACEMENT (IOC);  Surgeon: Birder Robson, MD;  Location: [facility] ORS;  Service: Ophthalmology;  Laterality: Right;  [place] 00:49 AP% 21.2 CDE 10.39 Fluid pack lot # [id number]  . CHOLECYSTECTOMY    . COLONOSCOPY WITH PROPOFOL N/A 11/22/2014   Procedure: COLONOSCOPY WITH PROPOFOL;  Surgeon: Manya Silvas, MD;  Location: [facility] ENDOSCOPY;  Service: Endoscopy;  Laterality: N/A;  . CYSTOSCOPY WITH STENT PLACEMENT Bilateral 11/27/2018   Procedure: CYSTOSCOPY WITH STENT PLACEMENT;  Surgeon: Festus Aloe, MD;  Location: [facility] ORS;  Service: Urology;  Laterality: Bilateral;  . CYSTOSCOPY/URETEROSCOPY/HOLMIUM LASER/STENT PLACEMENT Bilateral 12/17/2018   Procedure: CYSTOSCOPY/URETEROSCOPY/HOLMIUM LASER/STENT Exchange;  Surgeon: Billey Co, MD;  Location: [facility] ORS;  Service: Urology;  Laterality: Bilateral;  . ESOPHAGOGASTRODUODENOSCOPY  11/22/2014   Procedure: ESOPHAGOGASTRODUODENOSCOPY (EGD);  Surgeon: Manya Silvas, MD;  Location: [facility] ENDOSCOPY;  Service: Endoscopy;;  . ESOPHAGOGASTRODUODENOSCOPY (EGD) WITH PROPOFOL N/A 03/05/2017   Procedure: ESOPHAGOGASTRODUODENOSCOPY (EGD) WITH PROPOFOL;  Surgeon: Lin Landsman, MD;  Location: [place];  Service: Gastroenterology;  Laterality: N/A;  . EXTRACORPOREAL SHOCK WAVE LITHOTRIPSY Right 03/04/2018   Procedure: EXTRACORPOREAL SHOCK WAVE LITHOTRIPSY (ESWL);  Surgeon: Billey Co, MD;  Location: [facility]S;  Service: Urology;  Laterality: Right;  . EYE SURGERY Bilateral 2012   cataract extraction with iol  . GALLBLADDER SURGERY  1968  . hemi pelvectomy     left side at age 44  . HEMIPELVIC Left 1962  . LEG SURGERY Left    AMPUTATION d/t cancer at 86 years old  . MASTECTOMY, PARTIAL Left 07/27/2017   Procedure: MASTECTOMY PARTIAL;  Surgeon: Robert Bellow, MD;  Location:  [facility] ORS;  Service: General;  Laterality: Left;  . MOUTH SURGERY  2019   teeth pulled with a bridge insertion.   fell out 12/16/18  . OVARY SURGERY Right    cyst removed  . SAVORY DILATION  11/22/2014   Procedure: SAVORY DILATION;  Surgeon: Manya Silvas, MD;  Location[facility];  Service: Endoscopy;;  . SENTINEL NODE BIOPSY Left 07/27/2017   Procedure: SENTINEL NODE BIOPSY;  Surgeon: Robert Bellow, MD;  Location: [facility] ORS;  Service: General;  Laterality: Left;  . SKIN CANCER EXCISION     nose and arm and face  . TEE WITHOUT CARDIOVERSION N/A 07/25/2019   Procedure: TRANSESOPHAGEAL ECHOCARDIOGRAM (TEE);  Surgeon: Corey Skains, MD;  Location: [facility] ORS;  Service: Cardiovascular;  Laterality: N/A;  . TONSILLECTOMY      Social History   Socioeconomic History  . Marital status: Divorced    Spouse name: Not on file  . Number of children: Not on file  . Years of education: Not on file  . Highest education level: Not on file  Occupational History  . Occupation: Pharmacist, hospital    Comment: retired  Tobacco Use  . Smoking status: Never Smoker  . Smokeless tobacco: Never Used  Vaping Use  . Vaping Use: Never used  Substance and Sexual Activity  . Alcohol use: No  . Drug use: No  . Sexual activity: Not Currently  Other Topics Concern  . Not on file  Social History Narrative   [facility] lives [facility]   Social Determinants of Health   Financial Resource Strain:   . Difficulty of Paying Living Expenses:   Food Insecurity:   . Worried About Charity fundraiser in the Last Year:   . Arboriculturist in the Last Year:   Transportation Needs:   . Film/video editor (Medical):   Marland Kitchen Lack of Transportation (Non-Medical):   Physical Activity:   . Days of Exercise per Week:   . Minutes of Exercise per Session:   Stress:   . Feeling of Stress :   Social Connections:   . Frequency of Communication with Friends and Family:   . Frequency of Social  Gatherings with Friends and Family:   . Attends Religious Services:   . Active Member of Clubs or Organizations:   . Attends Archivist Meetings:   Marland Kitchen Marital Status:   Intimate Partner Violence:   . Fear of Current or Ex-Partner:   . Emotionally Abused:   Marland Kitchen Physically Abused:   . Sexually Abused:     Family History  Problem Relation Age of Onset  . Breast cancer Paternal Aunt   .  Ovarian cancer Sister   . Prostate cancer Father   . Stroke Mother   . Kidney cancer Neg Hx   . Bladder Cancer Neg Hx      Current Outpatient Medications:  .  fenofibrate 160 MG tablet, TAKE ONE TABLET BY MOUTH EVERY DAY, Disp: , Rfl:  .  GLIPIZIDE XL 2.5 MG 24 hr tablet, Take 2.5 mg by mouth daily. , Disp: , Rfl:  .  letrozole (FEMARA) 2.5 MG tablet, Take 1 tablet (2.5 mg total) by mouth daily., Disp: 90 tablet, Rfl: 3 .  metFORMIN (GLUCOPHAGE) 500 MG tablet, Take 1,000 mg by mouth 2 (two) times daily. , Disp: , Rfl:  .  metoprolol succinate (TOPROL-XL) 25 MG 24 hr tablet, Take 12.5 mg by mouth at bedtime. , Disp: , Rfl:  .  nystatin (MYCOSTATIN/NYSTOP) powder, Apply 1 application topically. Apply to right groin daily, Disp: , Rfl:  .  omeprazole (PRILOSEC) 40 MG capsule, Take 40 mg by mouth daily. , Disp: , Rfl:  .  PARoxetine (PAXIL) 40 MG tablet, Take 40 mg by mouth daily. , Disp: , Rfl:  .  Polyethyl Glycol-Propyl Glycol 0.4-0.3 % SOLN, Apply to eye 2 (two) times daily as needed., Disp: , Rfl:  .  PRADAXA 150 MG CAPS capsule, TAKE 1 CAPSULE BY MOUTH TWICE A DAY, Disp: 60 capsule, Rfl: 0 .  raloxifene (EVISTA) 60 MG tablet, Take 60 mg by mouth daily. , Disp: , Rfl:  .  loratadine (CLARITIN) 10 MG tablet, Take 10 mg by mouth daily. (Patient not taking: Reported on 10/13/2019), Disp: , Rfl:  .  Multiple Vitamin (MULTIVITAMIN WITH MINERALS) TABS tablet, Take 1 tablet by mouth daily. (Patient not taking: Reported on 10/13/2019), Disp:  , Rfl:   Physical exam:  Vitals:   10/13/19 1022  BP: (!)  122/42  Pulse: 71  Resp: 18  Temp: 97.8 F (36.6 C)  TempSrc: Tympanic  SpO2: 96%   Physical Exam Constitutional:      Appearance: Normal appearance.  HENT:     Head: Normocephalic and atraumatic.  Eyes:     Pupils: Pupils are equal, round, and reactive to light.  Cardiovascular:     Rate and Rhythm: Normal rate and regular rhythm.     Heart sounds: Normal heart sounds. No murmur heard.   Pulmonary:     Effort: Pulmonary effort is normal.     Breath sounds: Normal breath sounds. No wheezing.  Abdominal:     General: Bowel sounds are normal. There is no distension.     Palpations: Abdomen is soft.     Tenderness: There is no abdominal tenderness.  Musculoskeletal:        General: Normal range of motion.     Cervical back: Normal range of motion.     Right lower leg: Edema present.  Skin:    General: Skin is warm and dry.     Findings: No rash.  Neurological:     Mental Status: She is alert and oriented to person, place, and time.  Psychiatric:        Judgment: Judgment normal.      CMP Latest Ref Rng & Units 07/26/2019  Glucose 70 - 99 mg/dL -  BUN 8 - 23 mg/dL -  Creatinine 0.44 - 1.00 mg/dL 0.55  Sodium 135 - 145 mmol/L -  Potassium 3.5 - 5.1 mmol/L -  Chloride 98 - 111 mmol/L -  CO2 22 - 32 mmol/L -  Calcium 8.9 - 10.3 mg/dL -  Total Protein 6.5 - 8.1 g/dL -  Total Bilirubin 0.3 - 1.2 mg/dL -  Alkaline Phos 38 - 126 U/L -  AST 15 - 41 U/L -  ALT 0 - 44 U/L -   CBC Latest Ref Rng & Units 07/27/2019  WBC 4.0 - 10.5 K/uL 9.2  Hemoglobin 12.0 - 15.0 g/dL 7.8(L)  Hematocrit 36 - 46 % 27.7(L)  Platelets 150 - 400 K/uL 214    No images are attached to the encounter.  No results found.   Assessment and plan- Patient is a 86 y.o. female who presents to symptom management for right lower extremity swelling and discoloration.  Mrs. Dambrosia was recently diagnosed with factor V Leiden gene mutation.  She will be on lifelong anticoagulation.  She is currently  taking Pradaxa twice daily and has not missed any doses.  Given new onset of swelling and discomfort, will get ultrasound of right lower extremity to rule out new DVT.  Ultrasound was negative for DVT.  Given acute onset, would recommend follow-up with vein and vascular.  Patient is already established with Dr. Delana Meyer.  Plan: Right lower extremity ultrasound to rule out DVT-negative Work on Pradaxa authorization so the patient does not have to pay out-of-pocket-Kindra York, RN has been working on this for the patient.  Disposition: RTC on 10/27/2019 for follow-up with vein and vascular Dr. Delana Meyer   Visit Diagnosis 1. Coagulation disorder [facility])     Patient expressed understanding and was in agreement with this plan. She also understands that She can call clinic at any time with any questions, concerns, or complaints.   Greater than 50% was spent in counseling and coordination of care with this patient including but not limited to discussion of the relevant topics above (See A&P) including, but not limited to diagnosis and management of acute and chronic medical conditions.   Thank you for allowing me to participate in the care of this very pleasant patient.    Jacquelin Hawking, NP [place] [facility] - [phone] Pager- [phone] 10/13/2019 10:55 AM

## 2019-10-13 NOTE — Progress Notes (Signed)
Patient here today for [facility] clinic visit regarding swelling in right leg. Patient reports discomfort, swelling and redness to lower right leg and foot.

## 2019-10-17 ENCOUNTER — Ambulatory Visit
Admission: RE | Admit: 2019-10-17 | Discharge: 2019-10-17 | Disposition: A | Discharge status: Home / Self Care | Payer: Medicare Other | Source: Ambulatory Visit | Attending: Oncology | Admitting: Oncology

## 2019-10-17 DIAGNOSIS — C50412 Malignant neoplasm of upper-outer quadrant of left female breast: Secondary | ICD-10-CM | POA: Insufficient documentation

## 2019-10-17 DIAGNOSIS — M810 Age-related osteoporosis without current pathological fracture: Secondary | ICD-10-CM | POA: Insufficient documentation

## 2019-10-27 ENCOUNTER — Encounter (INDEPENDENT_AMBULATORY_CARE_PROVIDER_SITE_OTHER): Payer: Self-pay | Admitting: Vascular Surgery

## 2019-10-27 ENCOUNTER — Ambulatory Visit (INDEPENDENT_AMBULATORY_CARE_PROVIDER_SITE_OTHER): Payer: Medicare Other | Admitting: Vascular Surgery

## 2019-10-27 ENCOUNTER — Other Ambulatory Visit: Payer: Self-pay

## 2019-10-27 VITALS — BP 124/67 | HR 91 | Resp 18 | Ht 62.0 in | Wt 160.0 lb

## 2019-10-27 DIAGNOSIS — I10 Essential (primary) hypertension: Secondary | ICD-10-CM

## 2019-10-27 DIAGNOSIS — E119 Type 2 diabetes mellitus without complications: Secondary | ICD-10-CM

## 2019-10-27 DIAGNOSIS — E785 Hyperlipidemia, unspecified: Secondary | ICD-10-CM

## 2019-10-27 DIAGNOSIS — I87009 Postthrombotic syndrome without complications of unspecified extremity: Secondary | ICD-10-CM | POA: Diagnosis not present

## 2019-10-27 NOTE — Progress Notes (Signed)
MRN : [id number]  Julie Acosta is a 86 y.o. (January 19, 1934) female who presents with chief complaint of  Chief Complaint  Patient presents with  . Follow-up    no studies  .  History of Present Illness:   The patient presents to the office for follow up evaluation of DVT. DVT was identified at [place]. The initial symptoms were pain and swelling in the lower extremity.  The patient notes the leg continues to be painful with dependency and swells a bite. Symptoms are much better with elevation. The patient notes minimal edema in the morning which steadily worsens throughout the day.   Sheis not having anyhematuria at thistime.   The patient has been using compression therapy at this point.  No SOB or pleuritic chest pains. No cough or hemoptysis.  No blood per rectumbut she did have significant hematuria.  Workup did not reveal a source and she did not have to stop her Xarelto.  At this time the hematuria has mostly resolved.  She isstillhaving 4+ loose stools per day mostly in the AM and this is her typical pattern. Noblood in any sputum. No excessive bruising per the patient.  previous ABI's Rt=1.04 and the Lt=AKA  Current Meds  Medication Sig  . fenofibrate 160 MG tablet TAKE ONE TABLET BY MOUTH EVERY DAY  . GLIPIZIDE XL 2.5 MG 24 hr tablet Take 2.5 mg by mouth daily.   Marland Kitchen letrozole (FEMARA) 2.5 MG tablet Take 1 tablet (2.5 mg total) by mouth daily.  Marland Kitchen loratadine (CLARITIN) 10 MG tablet Take 10 mg by mouth daily.   Marland Kitchen losartan (COZAAR) 50 MG tablet Take 50 mg by mouth daily.  . metFORMIN (GLUCOPHAGE) 500 MG tablet Take 1,000 mg by mouth 2 (two) times daily.   . metoprolol succinate (TOPROL-XL) 25 MG 24 hr tablet Take 12.5 mg by mouth at bedtime.   Marland Kitchen nystatin (MYCOSTATIN/NYSTOP) powder Apply 1 application topically. Apply to right groin daily  . omeprazole (PRILOSEC) 40 MG capsule Take 40 mg by mouth daily.   Marland Kitchen PARoxetine (PAXIL) 40 MG tablet Take 40  mg by mouth daily.   Vladimir Faster Glycol-Propyl Glycol 0.4-0.3 % SOLN Apply to eye 2 (two) times daily as needed.  Marland Kitchen PRADAXA 150 MG CAPS capsule TAKE 1 CAPSULE BY MOUTH TWICE A DAY  . raloxifene (EVISTA) 60 MG tablet Take 60 mg by mouth daily.     Past Medical History:  Diagnosis Date  . Anxiety   . Benign breast lumps   . Blood clotting disorder ([place])   . Bone cancer (HCC)    head of femur, left leg ... amputation at age 71  . Breast cancer of upper-outer quadrant of left female breast ([place]) 07/27/2017   11 mm invasive mammary carcinoma, ER 90%, PR 51-90%, negative margins.  Oncotype recurrence score: 1.  DCIS, margin less than 0.5 mm.  Negative sentinel node.  Accelerated partial breast radiation.  . Cervicalgia   . Chronic kidney disease    kidney stones  . Colon cancer [facility])    patient unaware of this  . Complication of anesthesia    mood alteration / not sure if d/t pain medicine or anesthesia as she passed out   . Cough    NASAL DRIP / SNEEZING / SORE THROAT MOSTLY CONSTANT  . Depression   . Diabetes ([place])   . Diverticulitis   . Dizzy   . GERD (gastroesophageal reflux disease)   . H/O blood clots  arm and leg  . HBP (high blood pressure)   . Hematuria    gross  . Hemorrhoids   . HLD (hyperlipidemia)   . Muscle pain   . Osteoarthritis   . Osteoporosis   . Panic disorder   . Personal history of radiation therapy   . PND (post-nasal drip)    CHRONIC WITH SORE THROAT AND SNEEZING  . Reflux   . Skin cancer    nose  . Swelling   . Tremor   . Tremors of nervous system     Past Surgical History:  Procedure Laterality Date  . APPENDECTOMY  1962  . BREAST BIOPSY Right 2006?   benign  . BREAST BIOPSY Left 2019   invasive mammary carcinoma  . BREAST CYST EXCISION Left 07/27/2017   Procedure: SKIN CYST EXCISED;  Surgeon: Robert Bellow, MD;  Location: [facility] ORS;  Service: General;  Laterality: Left;  . BREAST LUMPECTOMY Left 07/2017   invasive mammary, DCIS   mammosite  . BREAST SURGERY Right 2019  . CARPAL TUNNEL RELEASE Right   . CATARACT EXTRACTION W/PHACO Left 11/11/2016   Procedure: CATARACT EXTRACTION PHACO AND INTRAOCULAR LENS PLACEMENT (IOC);  Surgeon: Birder Robson, MD;  Location: [facility] ORS;  Service: Ophthalmology;  Laterality: Left;  [place] 01:07.9 AP% 19.2 CDE 13.02 Fluid pack lot # [id number]  . CATARACT EXTRACTION W/PHACO Right 12/09/2016   Procedure: CATARACT EXTRACTION PHACO AND INTRAOCULAR LENS PLACEMENT (IOC);  Surgeon: Birder Robson, MD;  Location: [facility] ORS;  Service: Ophthalmology;  Laterality: Right;  [place] 00:49 AP% 21.2 CDE 10.39 Fluid pack lot # [id number]  . CHOLECYSTECTOMY    . COLONOSCOPY WITH PROPOFOL N/A 11/22/2014   Procedure: COLONOSCOPY WITH PROPOFOL;  Surgeon: Manya Silvas, MD;  Location: [facility] ENDOSCOPY;  Service: Endoscopy;  Laterality: N/A;  . CYSTOSCOPY WITH STENT PLACEMENT Bilateral 11/27/2018   Procedure: CYSTOSCOPY WITH STENT PLACEMENT;  Surgeon: Festus Aloe, MD;  Location: [facility] ORS;  Service: Urology;  Laterality: Bilateral;  . CYSTOSCOPY/URETEROSCOPY/HOLMIUM LASER/STENT PLACEMENT Bilateral 12/17/2018   Procedure: CYSTOSCOPY/URETEROSCOPY/HOLMIUM LASER/STENT Exchange;  Surgeon: Billey Co, MD;  Location: [facility] ORS;  Service: Urology;  Laterality: Bilateral;  . ESOPHAGOGASTRODUODENOSCOPY  11/22/2014   Procedure: ESOPHAGOGASTRODUODENOSCOPY (EGD);  Surgeon: Manya Silvas, MD;  Location: [facility] ENDOSCOPY;  Service: Endoscopy;;  . ESOPHAGOGASTRODUODENOSCOPY (EGD) WITH PROPOFOL N/A 03/05/2017   Procedure: ESOPHAGOGASTRODUODENOSCOPY (EGD) WITH PROPOFOL;  Surgeon: Lin Landsman, MD;  Location: [facility] ENDOSCOPY;  Service: Gastroenterology;  Laterality: N/A;  . EXTRACORPOREAL SHOCK WAVE LITHOTRIPSY Right 03/04/2018   Procedure: EXTRACORPOREAL SHOCK WAVE LITHOTRIPSY (ESWL);  Surgeon: Billey Co, MD;  Location: [facility] ORS;  Service: Urology;  Laterality: Right;  . EYE SURGERY Bilateral 2012   cataract  extraction with iol  . GALLBLADDER SURGERY  1968  . hemi pelvectomy     left side at age 8  . HEMIPELVIC Left 1962  . LEG SURGERY Left    AMPUTATION d/t cancer at 86 years old  . MASTECTOMY, PARTIAL Left 07/27/2017   Procedure: MASTECTOMY PARTIAL;  Surgeon: Robert Bellow, MD;  Location: [facility] ORS;  Service: General;  Laterality: Left;  . MOUTH SURGERY  2019   teeth pulled with a bridge insertion.   fell out 12/16/18  . OVARY SURGERY Right    cyst removed  . SAVORY DILATION  11/22/2014   Procedure: SAVORY DILATION;  Surgeon: Manya Silvas, MD;  Location[facility] ENDOSCOPY;  Service: Endoscopy;;  . SENTINEL NODE BIOPSY Left 07/27/2017   Procedure: SENTINEL NODE BIOPSY;  Surgeon:  Robert Bellow, MD;  Location: [facility] ORS;  Service: General;  Laterality: Left;  . SKIN CANCER EXCISION     nose and arm and face  . TEE WITHOUT CARDIOVERSION N/A 07/25/2019   Procedure: TRANSESOPHAGEAL ECHOCARDIOGRAM (TEE);  Surgeon: Corey Skains, MD;  Location: [facility] ORS;  Service: Cardiovascular;  Laterality: N/A;  . TONSILLECTOMY      Social History Social History   Tobacco Use  . Smoking status: Never Smoker  . Smokeless tobacco: Never Used  Vaping Use  . Vaping Use: Never used  Substance Use Topics  . Alcohol use: No  . Drug use: No    Family History Family History  Problem Relation Age of Onset  . Breast cancer Paternal Aunt   . Ovarian cancer Sister   . Prostate cancer Father   . Stroke Mother   . Kidney cancer Neg Hx   . Bladder Cancer Neg Hx     Allergies  Allergen Reactions  . Ciprofloxacin Hives  . Codeine Other (See Comments)    HALLUCINATIONS  . Tape     Rash and skin irritation/ paper tape and tegaderm OK  . Ciprocinonide [Fluocinolone] Other (See Comments)    unknown  . Amoxicillin Other (See Comments)    Upset stomach Has patient had a PCN reaction causing immediate rash, facial/tongue/throat swelling, SOB or lightheadedness with hypotension: No Has patient had  a PCN reaction causing severe rash involving mucus membranes or skin necrosis: No Has patient had a PCN reaction that required hospitalization: No Has patient had a PCN reaction occurring within the last 10 years: Yes If all of the above answers are "NO", then may proceed with Cephalosporin use.;  . Cefuroxime Other (See Comments)    OK INTRACAMERALLY PER DR WLP, upset stomach  . Lipitor [Atorvastatin] Other (See Comments)    unknown     REVIEW OF SYSTEMS (Negative unless checked)  Constitutional: [] Weight loss  [] Fever  [] Chills Cardiac: [] Chest pain   [] Chest pressure   [] Palpitations   [] Shortness of breath when laying flat   [] Shortness of breath with exertion. Vascular:  [] Pain in legs with walking   [] Pain in legs at rest  [] History of DVT   [] Phlebitis   [] Swelling in legs   [] Varicose veins   [] Non-healing ulcers Pulmonary:   [] Uses home oxygen   [] Productive cough   [] Hemoptysis   [] Wheeze  [] COPD   [] Asthma Neurologic:  [] Dizziness   [] Seizures   [] History of stroke   [] History of TIA  [] Aphasia   [] Vissual changes   [] Weakness or numbness in arm   [] Weakness or numbness in leg Musculoskeletal:   [] Joint swelling   [] Joint pain   [] Low back pain Hematologic:  [] Easy bruising  [] Easy bleeding   [] Hypercoagulable state   [] Anemic Gastrointestinal:  [] Diarrhea   [] Vomiting  [] Gastroesophageal reflux/heartburn   [] Difficulty swallowing. Genitourinary:  [] Chronic kidney disease   [] Difficult urination  [] Frequent urination   [] Blood in urine Skin:  [] Rashes   [] Ulcers  Psychological:  [] History of anxiety   []  History of major depression.  Physical Examination  Vitals:   10/27/19 1341  BP: 124/67  Pulse: 91  Resp: 18  Weight: 160 lb (72.6 kg)  Height: 5\' 2"  (1.575 m)   Body mass index is 29.26 kg/m. Gen: WD/WN, NAD Head: [place]/AT, No temporalis wasting.  Ear/Nose/Throat: Hearing grossly intact, nares w/o erythema or drainage Eyes: PER, EOMI, sclera nonicteric.  Neck:  Supple, no large masses.   Pulmonary:  Good  air movement, no audible wheezing bilaterally, no use of accessory muscles.  Cardiac: RRR, no JVD Vascular: left AKA Vessel Right Left  Radial Palpable Palpable  PT Not Palpable AKA  DP Not Palpable AKA  Gastrointestinal: Non-distended. No guarding/no peritoneal signs.  Musculoskeletal: M/S 5/5 throughout.  No deformity or atrophy.  Neurologic: CN 2-12 intact. Symmetrical.  Speech is fluent. Motor exam as listed above. Psychiatric: Judgment intact, Mood & affect appropriate for pt's clinical situation. Dermatologic: No rashes or ulcers noted.  No changes consistent with cellulitis. Lymph : No lichenification or skin changes of chronic lymphedema.  CBC Lab Results  Component Value Date   WBC 9.2 07/27/2019   HGB 7.8 (L) 07/27/2019   HCT 27.7 (L) 07/27/2019   MCV 74.3 (L) 07/27/2019   PLT 214 07/27/2019    BMET    Component Value Date/Time   NA 137 07/25/2019 0233   NA 139 07/01/2013 1721   K 3.7 07/25/2019 0233   K 4.6 07/01/2013 1721   CL 110 07/25/2019 0233   CL 108 (H) 07/01/2013 1721   CO2 22 07/25/2019 0233   CO2 23 07/01/2013 1721   GLUCOSE 175 (H) 07/25/2019 0233   GLUCOSE 150 (H) 07/01/2013 1721   BUN 12 07/25/2019 0233   BUN 23 01/20/2017 1134   BUN 21 (H) 07/01/2013 1721   CREATININE 0.55 07/26/2019 0641   CREATININE 0.95 07/01/2013 1721   CALCIUM 9.6 07/25/2019 0233   CALCIUM 11.2 (H) 07/01/2013 1721   GFRNONAA >60 07/26/2019 0641   GFRNONAA 58 (L) 07/01/2013 1721   GFRAA >60 07/26/2019 0641   GFRAA >60 07/01/2013 1721   CrCl cannot be calculated (Patient's most recent lab result is older than the maximum 21 days allowed.).  COAG Lab Results  Component Value Date   INR 1.3 (H) 07/24/2019    Radiology US Venous Img Lower Unilateral Right  Result Date: 10/13/2019 CLINICAL DATA:  Leg swelling.  History of remote DVT. EXAM: RIGHT LOWER EXTREMITY VENOUS DOPPLER ULTRASOUND TECHNIQUE: Gray-scale sonography  with compression, as well as color and duplex ultrasound, were performed to evaluate the deep venous system(s) from the level of the common femoral vein through the popliteal and proximal calf veins. COMPARISON:  05/24/2017 FINDINGS: VENOUS Normal compressibility of the common femoral and visualized portions of profunda femoral vein. Eccentric echogenic wall thickening in the proximal and mid femoral vein, with resultant incomplete compressibility, but continued flow signal throughout on color Doppler. These findings are stable since prior exam. Similarly there is some echogenic wall thickening in the proximal and distal popliteal vein with resulting in incomplete compressibility but continued flow signal throughout on color Doppler. Great saphenous vein unremarkable. No new filling defects to suggest acute DVT on grayscale or color Doppler imaging. Doppler waveforms show normal direction of venous flow, normal respiratory phasicity and response to augmentation. OTHER Contralateral amputation. Limitations: none IMPRESSION: 1. Stable chronic post-thrombotic changes in right femoral and popliteal veins. No evidence of acute DVT. Electronically Signed   By: Lucrezia Europe M.D.   On: 10/13/2019 12:46   DG Bone Density  Result Date: 10/17/2019 EXAM: DUAL X-RAY ABSORPTIOMETRY (DXA) FOR BONE MINERAL DENSITY IMPRESSION: Your patient Davia Smyre completed a BMD test on 10/17/2019 using the [place] (software version: 14.10) manufactured by [employer]. The following summarizes the results of our evaluation. Technologist: [facility] PATIENT BIOGRAPHICAL: Name: Birtha, Hatler Patient ID: [id number] Birth Date: 1933/05/22 Height: 60.0 in. Gender: Female Exam Date: 10/17/2019 Weight: 170.0 lbs.  Indications: Advanced Age, Caucasian, Diabetic, History of Breast Cancer, History of Radiation, Left Leg Amputation, Parent Hip Fracture, Postmenopausal Fractures: Treatments: Evista, Femara, Metformin DENSITOMETRY  RESULTS: Site         Region     Measured Date Measured Age WHO Classification Young Adult T-score BMD         %Change vs. Previous Significant Change (*) AP Spine L1-L4 10[age]17/2019 83.4 Osteopenia -1.3 1.039 g/cm2 10.4% Yes AP Spine L1-L4 10/13/2018 82.4 Osteopenia -2.0 0.941 g/cm2 -1.5% - AP Spine L1-L4 09/23/2017 81.4 Osteopenia -1.9 0.955 g/cm2 - - Right Femur Neck 10/17/2019 83.4 Osteopenia -2.3 0.713 g/cm2 -18.0% Yes Right Femur Neck 10/13/2018 82.4 Osteopenia -1.2 0.869 g/cm2 19.5% Yes Right Femur Neck 09/23/2017 81.4 Osteopenia -2.2 0.727 g/cm2 - - Left Forearm Radius 33% 10/17/2019 83.4 Osteoporosis -3.2 0.595 g/cm2 1.2% - Left Forearm Radius 33% 10/13/2018 82.4 Osteoporosis -3.3 0.588 g/cm2 -1.8% - Left Forearm Radius 33% 09/23/2017 81.4 Osteoporosis -3.2 0.599 g/cm2 - - ASSESSMENT: The BMD measured at Forearm Radius 33% is 0.595 g/cm2 with a T-score of -3.2. This patient is considered osteoporotic according to [facility]) criteria. The scan quality is limited due to patient condition. Compared with prior study, there has been significant increase in the spine. Left femur was excluded due to amputation and hemipelvectomy. World Pharmacologist [facility]) criteria for post-menopausal, Caucasian Women: Normal:                   T-score at or above -1 SD Osteopenia/low bone mass: T-score between -1 and -2.5 SD Osteoporosis:             T-score at or below -2.5 SD RECOMMENDATIONS: 1. All patients should optimize calcium and vitamin D intake. 2. Consider FDA-approved medical therapies in postmenopausal women and men aged 15 years and older, based on the following: a. A hip or vertebral(clinical or morphometric) fracture b. T-score < -2.5 at the femoral neck or spine after appropriate evaluation to exclude secondary causes c. Low bone mass (T-score between -1.0 and -2.5 at the femoral neck or spine) and a 10-year probability of a hip fracture > 3% or a 10-year probability of a major  osteoporosis-related fracture > 20% based on the [place]-adapted WHO algorithm 3. Clinician judgment and/or patient preferences may indicate treatment for people with 10-year fracture probabilities above or below these levels FOLLOW-UP: People with diagnosed cases of osteoporosis or at high risk for fracture should have regular bone mineral density tests. For patients eligible for Medicare, routine testing is allowed once every 2 years. The testing frequency can be increased to one year for patients who have rapidly progressing disease, those who are receiving or discontinuing medical therapy to restore bone mass, or have additional risk factors. I have reviewed this report, and agree with the above findings. [facility] Electronically Signed   By: Lowella Grip III M.D.   On: 10/17/2019 11:49     Assessment/Plan 1. Post-phlebitic syndrome Recommend:   No surgery or intervention at this point in time.  IVC filter is not indicated at present.  Patient's duplex ultrasound of the venous system shows DVT from the popliteal to the femoral veins.  The patient is initiated on anticoagulation   Elevation was stressed, use of a recliner was discussed.  I have had a long discussion with the patient regarding DVT and post phlebitic changes such as swelling and why it causes symptoms such as pain. The patient will wear graduated compression stockings class 1 (20-30  mmHg), beginning after three full days of anticoagulation, on a daily basis a prescription was given. The patient will beginning wearing the stockings first thing in the morning and removing them in the evening. The patient is instructed specifically not to sleep in the stockings. In addition, behavioral modification including elevation during the day and avoidance of prolonged dependency will be initiated.   The patient will continue anticoagulation for now as there have not been any problems or complications at this  point. - VAS [place] ABI WITH/WO TBI; Future  2. Essential hypertension Continue antihypertensive medications as already ordered, these medications have been reviewed and there are no changes at this time.   3. Controlled type 2 diabetes mellitus without complication, without long-term current use of insulin ([place]) Continue hypoglycemic medications as already ordered, these medications have been reviewed and there are no changes at this time.  Hgb A1C to be monitored as already arranged by primary service   4. Hyperlipidemia, unspecified hyperlipidemia type Continue statin as ordered and reviewed, no changes at this time     Hortencia Pilar, MD  10/27/2019 1:53 PM

## 2019-11-03 ENCOUNTER — Other Ambulatory Visit: Payer: Self-pay | Admitting: Emergency Medicine

## 2019-11-03 ENCOUNTER — Telehealth: Payer: Self-pay

## 2019-11-03 DIAGNOSIS — D689 Coagulation defect, unspecified: Secondary | ICD-10-CM

## 2019-11-03 MED ORDER — DABIGATRAN ETEXILATE MESYLATE 150 MG PO CAPS: 150.0000 mg | ORAL_CAPSULE | Freq: Two times a day (BID) | ORAL | 0 refills | Status: DC

## 2019-11-03 NOTE — Telephone Encounter (Signed)
Julie Acosta - please, please, please write a letter of medical necessity on [employer] letterhead stating why she needs the pradaxa and can't take eliquis, warfarin, or xarelto and print and sign for me please so that I can fax it with all the other stuff we've previously faxed. I need this tomorrow morning if at all possible.   Barnabas Lister - anyway we can get her financial assistance on her next refill while we're fighting (again) with the insurance company? The urgent appeal takes about 72 hours, but I'm not sure how the holiday weekend will affect that.   Thanks!

## 2019-11-03 NOTE — Telephone Encounter (Signed)
Daughter of patient called and spoke to one of schedulers Pharmacist, community) asking for someone to return call about Pradaxa medication.     I called spoke with patient's daughter She states that they was able to pick up her mother's medication last month but had to pay a lot out of pocket. Patient only has 6 days of medication left and will need a refill soon.They were hoping to get some help with the cost of medication but are still waiting to hear from someone about appeal. She would like to know if an alternative medication is possible. She states if not they could keep paying for it. Please advise.

## 2019-11-07 ENCOUNTER — Encounter (INDEPENDENT_AMBULATORY_CARE_PROVIDER_SITE_OTHER): Payer: Self-pay | Admitting: Vascular Surgery

## 2019-11-17 ENCOUNTER — Encounter: Payer: Self-pay | Admitting: Podiatry

## 2019-11-17 ENCOUNTER — Other Ambulatory Visit: Payer: Self-pay

## 2019-11-17 ENCOUNTER — Ambulatory Visit (INDEPENDENT_AMBULATORY_CARE_PROVIDER_SITE_OTHER): Payer: Medicare Other | Admitting: Podiatry

## 2019-11-17 DIAGNOSIS — D689 Coagulation defect, unspecified: Secondary | ICD-10-CM

## 2019-11-17 DIAGNOSIS — M79676 Pain in unspecified toe(s): Secondary | ICD-10-CM

## 2019-11-17 DIAGNOSIS — E119 Type 2 diabetes mellitus without complications: Secondary | ICD-10-CM | POA: Diagnosis not present

## 2019-11-17 DIAGNOSIS — M79674 Pain in right toe(s): Secondary | ICD-10-CM

## 2019-11-17 DIAGNOSIS — B351 Tinea unguium: Secondary | ICD-10-CM

## 2019-11-17 DIAGNOSIS — S88912D Complete traumatic amputation of left lower leg, level unspecified, subsequent encounter: Secondary | ICD-10-CM

## 2019-11-17 NOTE — Progress Notes (Signed)
This patient presents the office for continued preventative foot care services on her right foot. She hasn't history of an amputation of the left leg due to bone cancer at 26.  Patient is a type II diabetic.  Patient is presently taking Xarelto  and Paxil.  Patient states that the nails have grown thick and long and are painful walking and wearing her shoes.  She presents the office today for preventative foot care services.     General Appearance  Alert, conversant and in no acute stress.  Vascular  Dorsalis pedis and posterior tibial  pulses are palpable  right..  Capillary return is within normal limits right foot.. Temperature is within normal limits  Right foot.  Neurologic  Senn-Weinstein monofilament wire test within normal limits  . Muscle power within normal limits .  Nails Thick disfigured discolored nails with subungual debris  from hallux to fifth toes right. No evidence of bacterial infection or drainage   Orthopedic  No limitations of motion of motion feet .  No crepitus or effusions noted.  No bony pathology or digital deformities noted. Amputation left leg.  Skin  normotropic skin with no porokeratosis noted right.  No signs of infections or ulcers noted.    Onychomycosis  Amputation left leg.  Diabetes.     Debridement and grinding of nails right foot.    RTC 3 months   Gardiner Barefoot DPM

## 2019-11-30 ENCOUNTER — Telehealth: Payer: Self-pay | Admitting: *Deleted

## 2019-11-30 NOTE — Telephone Encounter (Signed)
Call placed to [employer] to follow up on previously initiated appeal. Denial has been overturned at this time and Pradaxa will be covered by patients insurance. I have notified the pharmacy and prescription sent through insurance, patients copay for drug is $100.00. Left vm for patient to update her on status of insurance approval. Will reach out to Barnabas Lister if patient needs further assistance with copay.

## 2019-12-02 ENCOUNTER — Telehealth: Payer: Self-pay | Admitting: *Deleted

## 2019-12-02 NOTE — Telephone Encounter (Signed)
Call placed to patient to update her on the status of Julie Acosta prescription. Patients Julie Acosta is approved by her insurance with $100 co-pay, Barnabas Lister is going to cover the cost of co-pay for patient on the [place] at [employer]. Patient is aware and verbalizes understanding.

## 2020-01-05 ENCOUNTER — Other Ambulatory Visit: Payer: Self-pay | Admitting: *Deleted

## 2020-01-05 DIAGNOSIS — D689 Coagulation defect, unspecified: Secondary | ICD-10-CM

## 2020-01-05 MED ORDER — DABIGATRAN ETEXILATE MESYLATE 150 MG PO CAPS: 150.0000 mg | ORAL_CAPSULE | Freq: Two times a day (BID) | ORAL | 2 refills | Status: DC

## 2020-01-05 NOTE — Telephone Encounter (Signed)
We have been covering her co-pay on the [place] [facility]. I have ok from [place]. Thanks.

## 2020-01-05 NOTE — Telephone Encounter (Signed)
Patient called stating that she only has enough Pradaxa for tonight's dose and that she is supposed to get assistance with this medicine. Per Bethena Roys, she does not do assistance for this drug and her copay is $100. Please advise.

## 2020-01-22 NOTE — Progress Notes (Signed)
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  Oncotype DX score 1.  Homozygous factor V Leiden.  INTERVAL HISTORY: Patient returns to clinic today for further evaluation and continuation of Prolia.  She currently feels well and is at her baseline.  She is tolerating Pradaxa and letrozole without significant side effects.  She has no neurologic complaints.  She denies any recent fevers or illnesses.  She has a good appetite and denies weight loss.  She denies any chest pain, shortness of breath, cough, or hemoptysis.  She denies any nausea, vomiting, constipation, or diarrhea.  She has no urinary complaints.  Patient offers no specific complaints today.  REVIEW OF SYSTEMS:   Review of Systems  Constitutional: Negative.  Negative for fever, malaise/fatigue and weight loss.  Respiratory: Negative.  Negative for cough and shortness of breath.   Cardiovascular: Negative.  Negative for chest pain and leg swelling.  Gastrointestinal: Negative.  Negative for abdominal pain.  Genitourinary: Negative for dysuria and flank pain.  Musculoskeletal: Negative for joint pain.  Skin: Negative.  Negative for rash.  Neurological: Negative.  Negative for dizziness, sensory change, weakness and headaches.  Psychiatric/Behavioral: Negative.  The patient is not nervous/anxious.     As per HPI. Otherwise, a complete review of systems is negative.   PAST MEDICAL HISTORY: Reviewed and unchanged.  PAST SURGICAL HISTORY: Past Surgical History:  Procedure Laterality Date  . APPENDECTOMY  1962  . BREAST  BIOPSY Right 2006?   benign  . BREAST BIOPSY Left 2019   invasive mammary carcinoma  . BREAST CYST EXCISION Left 07/27/2017   Procedure: SKIN CYST EXCISED;  Surgeon: Robert Bellow, MD;  Location: [facility] ORS;  Service: General;  Laterality: Left;  . BREAST LUMPECTOMY Left 07/2017   invasive mammary, DCIS  mammosite  . BREAST SURGERY Right 2019  . CARPAL TUNNEL RELEASE Right   . CATARACT EXTRACTION W/PHACO Left 11/11/2016   Procedure: CATARACT EXTRACTION PHACO AND INTRAOCULAR LENS PLACEMENT (IOC);  Surgeon: Birder Robson, MD;  Location: [facility]S;  Service: Ophthalmology;  Laterality: Left;  [place] 01:07.9 AP% 19.2 CDE 13.02 Fluid pack lot # [id number]  . CATARACT EXTRACTION W/PHACO Right 12/09/2016   Procedure: CATARACT EXTRACTION PHACO AND INTRAOCULAR LENS PLACEMENT (IOC);  Surgeon: Birder Robson, MD;  Location: [facility];  Service: Ophthalmology;  Laterality: Right;  [place] 00:49 AP% 21.2 CDE 10.39 Fluid pack lot # [id number]  . CHOLECYSTECTOMY    . COLONOSCOPY WITH PROPOFOL N/A 11/22/2014   Procedure: COLONOSCOPY WITH PROPOFOL;  Surgeon: Manya Silvas, MD;  Location: [facility] ENDOSCOPY;  Service: Endoscopy;  Laterality: N/A;  . CYSTOSCOPY WITH STENT PLACEMENT Bilateral 11/27/2018   Procedure: CYSTOSCOPY WITH STENT PLACEMENT;  Surgeon: Festus Aloe, MD;  Location: [facility];  Service: Urology;  Laterality: Bilateral;  . CYSTOSCOPY/URETEROSCOPY/HOLMIUM LASER/STENT PLACEMENT Bilateral 12/17/2018   Procedure: CYSTOSCOPY/URETEROSCOPY/HOLMIUM LASER/STENT Exchange;  Surgeon: Billey Co, MD;  Location: [facility];  Service: Urology;  Laterality: Bilateral;  . ESOPHAGOGASTRODUODENOSCOPY  11/22/2014   Procedure: ESOPHAGOGASTRODUODENOSCOPY (EGD);  Surgeon: Manya Silvas, MD;  Location: [facility] ENDOSCOPY;  Service: Endoscopy;;  . ESOPHAGOGASTRODUODENOSCOPY (EGD) WITH PROPOFOL N/A 03/05/2017   Procedure: ESOPHAGOGASTRODUODENOSCOPY (EGD) WITH  PROPOFOL;  Surgeon: Lin Landsman, MD;  Location: [facility]  ENDOSCOPY;  Service: Gastroenterology;  Laterality: N/A;  . EXTRACORPOREAL SHOCK WAVE LITHOTRIPSY Right 03/04/2018   Procedure: EXTRACORPOREAL SHOCK WAVE LITHOTRIPSY (ESWL);  Surgeon: Billey Co, MD;  Location: [facility] ORS;  Service: Urology;  Laterality: Right;  . EYE SURGERY Bilateral 2012   cataract extraction with iol  . GALLBLADDER SURGERY  1968  . hemi pelvectomy     left side at age 46  . HEMIPELVIC Left 1962  . LEG SURGERY Left    AMPUTATION d/t cancer at 86 years old  . MASTECTOMY, PARTIAL Left 07/27/2017   Procedure: MASTECTOMY PARTIAL;  Surgeon: Robert Bellow, MD;  Location: [facility] ORS;  Service: General;  Laterality: Left;  . MOUTH SURGERY  2019   teeth pulled with a bridge insertion.   fell out 12/16/18  . OVARY SURGERY Right    cyst removed  . SAVORY DILATION  11/22/2014   Procedure: SAVORY DILATION;  Surgeon: Manya Silvas, MD;  Location: [facility] ENDOSCOPY;  Service: Endoscopy;;  . SENTINEL NODE BIOPSY Left 07/27/2017   Procedure: SENTINEL NODE BIOPSY;  Surgeon: Robert Bellow, MD;  Location: [facility] ORS;  Service: General;  Laterality: Left;  . SKIN CANCER EXCISION     nose and arm and face  . TEE WITHOUT CARDIOVERSION N/A 07/25/2019   Procedure: TRANSESOPHAGEAL ECHOCARDIOGRAM (TEE);  Surgeon: Corey Skains, MD;  Location: [facility] ORS;  Service: Cardiovascular;  Laterality: N/A;  . TONSILLECTOMY      FAMILY HISTORY: Family History  Problem Relation Age of Onset  . Breast cancer Paternal Aunt   . Ovarian cancer Sister   . Prostate cancer Father   . Stroke Mother   . Kidney cancer Neg Hx   . Bladder Cancer Neg Hx     ADVANCED DIRECTIVES (Y/N):  N  HEALTH MAINTENANCE: Social History   Tobacco Use  . Smoking status: Never Smoker  . Smokeless tobacco: Never Used  Vaping Use  . Vaping Use: Never used  Substance Use Topics  . Alcohol use: No  . Drug use: No     Colonoscopy:  PAP:  Bone density:  Lipid panel:  Allergies  Allergen Reactions  .  Ciprofloxacin Hives  . Codeine Other (See Comments)    HALLUCINATIONS  . Tape     Rash and skin irritation/ paper tape and tegaderm OK  . Ciprocinonide [Fluocinolone] Other (See Comments)    unknown  . Amoxicillin Other (See Comments)    Upset stomach Has patient had a PCN reaction causing immediate rash, facial/tongue/throat swelling, SOB or lightheadedness with hypotension: No Has patient had a PCN reaction causing severe rash involving mucus membranes or skin necrosis: No Has patient had a PCN reaction that required hospitalization: No Has patient had a PCN reaction occurring within the last 10 years: Yes If all of the above answers are "NO", then may proceed with Cephalosporin use.;  . Cefuroxime Other (See Comments)    OK INTRACAMERALLY PER DR WLP, upset stomach  . Lipitor [Atorvastatin] Other (See Comments)    unknown    Current Outpatient Medications  Medication Sig Dispense Refill  . dabigatran (PRADAXA) 150 MG CAPS capsule Take 1 capsule (150 mg total) by mouth 2 (two) times daily. 60 capsule 2  . fenofibrate 160 MG tablet TAKE ONE TABLET BY MOUTH EVERY DAY    . GLIPIZIDE XL 2.5 MG 24 hr tablet Take 2.5 mg by mouth daily.     Marland Kitchen letrozole [facility])  2.5 MG tablet Take 1 tablet (2.5 mg total) by mouth daily. 90 tablet 3  . loratadine (CLARITIN) 10 MG tablet Take 10 mg by mouth daily.     Marland Kitchen losartan (COZAAR) 50 MG tablet Take 50 mg by mouth daily.    . metFORMIN (GLUCOPHAGE) 500 MG tablet Take 1,000 mg by mouth 2 (two) times daily.     . metoprolol succinate (TOPROL-XL) 25 MG 24 hr tablet Take 12.5 mg by mouth at bedtime.     . Multiple Vitamin (MULTIVITAMIN WITH MINERALS) TABS tablet Take 1 tablet by mouth daily.    Marland Kitchen nystatin (MYCOSTATIN/NYSTOP) powder Apply 1 application topically. Apply to right groin daily    . omeprazole (PRILOSEC) 40 MG capsule Take 40 mg by mouth daily.     Marland Kitchen PARoxetine (PAXIL) 40 MG tablet Take 40 mg by mouth daily.     Bertram Gala Glycol-Propyl Glycol  0.4-0.3 % SOLN Apply to eye 2 (two) times daily as needed.    . raloxifene (EVISTA) 60 MG tablet Take 60 mg by mouth daily.      No current facility-administered medications for this visit.    OBJECTIVE: Vitals:   01/24/20 1102  BP: (!) 128/54  Pulse: 85  Resp: 18  Temp: 98.7 F (37.1 C)  SpO2: 95%     Body mass index is 29.15 kg/m.    ECOG FS:0 - Asymptomatic  General: Well-developed, well-nourished, no acute distress.  Sitting in a wheelchair. Eyes: Pink conjunctiva, anicteric sclera. HEENT: Normocephalic, moist mucous membranes. Lungs: No audible wheezing or coughing. Heart: Regular rate and rhythm. Abdomen: Soft, nontender, no obvious distention. Musculoskeletal: No edema, cyanosis, or clubbing.  Left leg amputation. Neuro: Alert, answering all questions appropriately. Cranial nerves grossly intact. Skin: No rashes or petechiae noted. Psych: Normal affect.   LAB RESULTS:  Lab Results  Component Value Date   NA 141 01/24/2020   K 5.1 01/24/2020   CL 110 01/24/2020   CO2 22 01/24/2020   GLUCOSE 236 (H) 01/24/2020   BUN 25 (H) 01/24/2020   CREATININE 0.88 01/24/2020   CALCIUM 10.5 (H) 01/24/2020   PROT 5.6 (L) 07/24/2019   ALBUMIN 2.8 (L) 07/24/2019   AST 17 07/24/2019   ALT 9 07/24/2019   ALKPHOS 35 (L) 07/24/2019   BILITOT 0.8 07/24/2019   GFRNONAA >60 01/24/2020   GFRAA >60 01/24/2020    Lab Results  Component Value Date   WBC 10.5 01/24/2020   NEUTROABS 7.2 01/24/2020   HGB 9.0 (L) 01/24/2020   HCT 31.6 (L) 01/24/2020   MCV 72.0 (L) 01/24/2020   PLT 363 01/24/2020     STUDIES: No results found.  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  Homozygous factor V Leiden.  PLAN:    1.  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  Patient had a lumpectomy on July 27, 2017.  She completed radiation with MammoSite treatment.  Continue letrozole for a total of 5 years completing treatment in May 2024.  Her most recent mammogram on June 21, 2019 was reported as BI-RADS 2.  Repeat in February 2022.  Return to clinic in 6 months for routine evaluation.   2.  Osteopenia: Patient's most recent bone mineral density on October 17, 2019 revealed a T score  of -3.2 which is significantly worse than previous.  Repeat in June 2022.  Patient continues to refuse treatment with Fosamax, but has agreed to continue Prolia every 6 months.  Proceed with treatment today.  Continue calcium and vitamin D supplementation.  Return to clinic in approximately 6 months as above. 3.  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 4.  Homozygous factor V Leiden: Patient had multiple splenic infarcts suspicious for thrombosis while taking Xarelto.  Subsequent hypercoagulable work-up revealed the patient is homozygous for factor V Leiden.  Continue Pradaxa 150 mg every 12 hours.  She will require lifelong anticoagulation.     Patient expressed understanding and was in agreement with this plan. She also understands that She can call clinic at any time with any questions, concerns, or complaints.   Cancer Staging Primary cancer of upper outer quadrant of left female breast [facility]) Staging form: Breast, AJCC 8th Edition - Clinical stage from 06/12/2017: Stage IA (cT1b, cN0, cM0, G2, ER: Positive, PR: Positive, HER2: Negative) - Signed by Lloyd Huger, MD on 06/12/2017   Lloyd Huger, MD   01/25/2020 2:19 PM

## 2020-01-24 ENCOUNTER — Inpatient Hospital Stay (HOSPITAL_BASED_OUTPATIENT_CLINIC_OR_DEPARTMENT_OTHER): Payer: Medicare Other | Admitting: Oncology

## 2020-01-24 ENCOUNTER — Other Ambulatory Visit: Payer: Self-pay

## 2020-01-24 ENCOUNTER — Encounter: Payer: Self-pay | Admitting: Oncology

## 2020-01-24 ENCOUNTER — Inpatient Hospital Stay: Payer: Medicare Other

## 2020-01-24 ENCOUNTER — Inpatient Hospital Stay: Payer: Medicare Other | Attending: Oncology

## 2020-01-24 VITALS — BP 128/54 | HR 85 | Temp 98.7°F | Resp 18 | Wt 159.4 lb

## 2020-01-24 DIAGNOSIS — D689 Coagulation defect, unspecified: Secondary | ICD-10-CM | POA: Diagnosis not present

## 2020-01-24 DIAGNOSIS — C50412 Malignant neoplasm of upper-outer quadrant of left female breast: Secondary | ICD-10-CM

## 2020-01-24 DIAGNOSIS — M8580 Other specified disorders of bone density and structure, unspecified site: Secondary | ICD-10-CM | POA: Diagnosis not present

## 2020-01-24 DIAGNOSIS — M810 Age-related osteoporosis without current pathological fracture: Secondary | ICD-10-CM

## 2020-01-24 LAB — CBC WITH DIFFERENTIAL/PLATELET
Abs Immature Granulocytes: 0.07 10*3/uL (ref 0.00–0.07)
Basophils Absolute: 0.1 10*3/uL (ref 0.0–0.1)
Basophils Relative: 1 %
Eosinophils Absolute: 0.3 10*3/uL (ref 0.0–0.5)
Eosinophils Relative: 3 %
HCT: 31.6 % — ABNORMAL LOW (ref 36.0–46.0)
Hemoglobin: 9 g/dL — ABNORMAL LOW (ref 12.0–15.0)
Immature Granulocytes: 1 %
Lymphocytes Relative: 20 %
Lymphs Abs: 2.1 10*3/uL (ref 0.7–4.0)
MCH: 20.5 pg — ABNORMAL LOW (ref 26.0–34.0)
MCHC: 28.5 g/dL — ABNORMAL LOW (ref 30.0–36.0)
MCV: 72 fL — ABNORMAL LOW (ref 80.0–100.0)
Monocytes Absolute: 0.8 10*3/uL (ref 0.1–1.0)
Monocytes Relative: 8 %
Neutro Abs: 7.2 10*3/uL (ref 1.7–7.7)
Neutrophils Relative %: 67 %
Platelets: 363 10*3/uL (ref 150–400)
RBC: 4.39 MIL/uL (ref 3.87–5.11)
RDW: 21.5 % — ABNORMAL HIGH (ref 11.5–15.5)
WBC: 10.5 10*3/uL (ref 4.0–10.5)
nRBC: 0.2 % (ref 0.0–0.2)

## 2020-01-24 LAB — BASIC METABOLIC PANEL
Anion gap: 9 (ref 5–15)
BUN: 25 mg/dL — ABNORMAL HIGH (ref 8–23)
CO2: 22 mmol/L (ref 22–32)
Calcium: 10.5 mg/dL — ABNORMAL HIGH (ref 8.9–10.3)
Chloride: 110 mmol/L (ref 98–111)
Creatinine, Ser: 0.88 mg/dL (ref 0.44–1.00)
GFR calc Af Amer: >60 mL/min (ref >60)
GFR calc non Af Amer: >60 mL/min (ref >60)
Glucose, Bld: 236 mg/dL — ABNORMAL HIGH (ref 70–99)
Potassium: 5.1 mmol/L (ref 3.5–5.1)
Sodium: 141 mmol/L (ref 135–145)

## 2020-01-24 MED ORDER — DENOSUMAB 60 MG/ML ~~LOC~~ SOSY
60.0000 mg | PREFILLED_SYRINGE | Freq: Once | SUBCUTANEOUS | Status: AC
  Administered 2020-01-24: 60 mg via SUBCUTANEOUS
  Filled 2020-01-24: qty 1

## 2020-01-24 NOTE — Progress Notes (Signed)
Patient states she has tremors and feels like they are getting worse. Reports leg brace is helping better than compression stockings. Denies any pain or other concerns at this time.

## 2020-01-26 ENCOUNTER — Other Ambulatory Visit: Payer: Self-pay

## 2020-01-26 ENCOUNTER — Ambulatory Visit (INDEPENDENT_AMBULATORY_CARE_PROVIDER_SITE_OTHER): Payer: Medicare Other

## 2020-01-26 ENCOUNTER — Encounter (INDEPENDENT_AMBULATORY_CARE_PROVIDER_SITE_OTHER): Payer: Self-pay | Admitting: Vascular Surgery

## 2020-01-26 ENCOUNTER — Ambulatory Visit (INDEPENDENT_AMBULATORY_CARE_PROVIDER_SITE_OTHER): Payer: Medicare Other | Admitting: Vascular Surgery

## 2020-01-26 VITALS — BP 125/61 | HR 92 | Resp 15 | Wt 159.0 lb

## 2020-01-26 DIAGNOSIS — I10 Essential (primary) hypertension: Secondary | ICD-10-CM

## 2020-01-26 DIAGNOSIS — D735 Infarction of spleen: Secondary | ICD-10-CM | POA: Diagnosis not present

## 2020-01-26 DIAGNOSIS — E119 Type 2 diabetes mellitus without complications: Secondary | ICD-10-CM | POA: Diagnosis not present

## 2020-01-26 DIAGNOSIS — I87009 Postthrombotic syndrome without complications of unspecified extremity: Secondary | ICD-10-CM

## 2020-01-26 DIAGNOSIS — D689 Coagulation defect, unspecified: Secondary | ICD-10-CM

## 2020-01-26 NOTE — Progress Notes (Signed)
MRN : [id number]  Julie Acosta is a 86 y.o. (11/09/1933) female who presents with chief complaint of  Chief Complaint  Patient presents with  . Follow-up    40month abi  .  History of Present Illness:   The patient presents to the office forfollow upevaluation of DVT. DVT was identified at [place]. The initial symptoms were pain and swelling in the lower extremity.  The patient notes her right leg has been less painful with dependency and swells some but is well controled with a Sigvaris compression wrap. Symptoms are much better with elevation. The patient notes minimal edema in the morning which steadily worsens throughout the day if not wearing compression.   Sheis not having anyhematuria at thistime.   The patient has been using compression therapy at this point.  No SOB or pleuritic chest pains. No cough or hemoptysis.  Earlier this year she developed left flank pain and was found to have multiple splenic infarcts while taking Xarelto so she has been changed to Pradaxa.  previous ABI's Rt=1.04 and the Lt=AKA  Current Meds  Medication Sig  . dabigatran (PRADAXA) 150 MG CAPS capsule Take 1 capsule (150 mg total) by mouth 2 (two) times daily.  . fenofibrate 160 MG tablet TAKE ONE TABLET BY MOUTH EVERY DAY  . GLIPIZIDE XL 2.5 MG 24 hr tablet Take 2.5 mg by mouth daily.   Marland Kitchen letrozole (FEMARA) 2.5 MG tablet Take 1 tablet (2.5 mg total) by mouth daily.  Marland Kitchen loratadine (CLARITIN) 10 MG tablet Take 10 mg by mouth daily.   Marland Kitchen losartan (COZAAR) 50 MG tablet Take 50 mg by mouth daily.  . metFORMIN (GLUCOPHAGE) 500 MG tablet Take 1,000 mg by mouth 2 (two) times daily.   . metoprolol succinate (TOPROL-XL) 25 MG 24 hr tablet Take 12.5 mg by mouth at bedtime.   . Multiple Vitamin (MULTIVITAMIN WITH MINERALS) TABS tablet Take 1 tablet by mouth daily.  Marland Kitchen nystatin (MYCOSTATIN/NYSTOP) powder Apply 1 application topically. Apply to right groin daily  . omeprazole  (PRILOSEC) 40 MG capsule Take 40 mg by mouth daily.   Marland Kitchen PARoxetine (PAXIL) 40 MG tablet Take 40 mg by mouth daily.   Vladimir Faster Glycol-Propyl Glycol 0.4-0.3 % SOLN Apply to eye 2 (two) times daily as needed.  . raloxifene (EVISTA) 60 MG tablet Take 60 mg by mouth daily.     Past Medical History:  Diagnosis Date  . Anxiety   . Benign breast lumps   . Blood clotting disorder ([place])   . Bone cancer ([facility])    head of femur, left leg ... amputation at age 4  . Breast cancer of upper-outer quadrant of left female breast ([place]) 07/27/2017   11 mm invasive mammary carcinoma, ER 90%, PR 51-90%, negative margins.  Oncotype recurrence score: 1.  DCIS, margin less than 0.5 mm.  Negative sentinel node.  Accelerated partial breast radiation.  . Cervicalgia   . Chronic kidney disease    kidney stones  . Colon cancer [facility])    patient unaware of this  . Complication of anesthesia    mood alteration / not sure if d/t pain medicine or anesthesia as she passed out   . Cough    NASAL DRIP / SNEEZING / SORE THROAT MOSTLY CONSTANT  . Depression   . Diabetes ([place])   . Diverticulitis   . Dizzy   . GERD (gastroesophageal reflux disease)   . H/O blood clots    arm and leg  .  HBP (high blood pressure)   . Hematuria    gross  . Hemorrhoids   . HLD (hyperlipidemia)   . Muscle pain   . Osteoarthritis   . Osteoporosis   . Panic disorder   . Personal history of radiation therapy   . PND (post-nasal drip)    CHRONIC WITH SORE THROAT AND SNEEZING  . Reflux   . Skin cancer    nose  . Swelling   . Tremor   . Tremors of nervous system     Past Surgical History:  Procedure Laterality Date  . APPENDECTOMY  1962  . BREAST BIOPSY Right 2006?   benign  . BREAST BIOPSY Left 2019   invasive mammary carcinoma  . BREAST CYST EXCISION Left 07/27/2017   Procedure: SKIN CYST EXCISED;  Surgeon: Robert Bellow, MD;  Location: [facility] ORS;  Service: General;  Laterality: Left;  . BREAST LUMPECTOMY Left 07/2017    invasive mammary, DCIS  mammosite  . BREAST SURGERY Right 2019  . CARPAL TUNNEL RELEASE Right   . CATARACT EXTRACTION W/PHACO Left 11/11/2016   Procedure: CATARACT EXTRACTION PHACO AND INTRAOCULAR LENS PLACEMENT (IOC);  Surgeon: Birder Robson, MD;  Location: [facility] ORS;  Service: Ophthalmology;  Laterality: Left;  [place] 01:07.9 AP% 19.2 CDE 13.02 Fluid pack lot # [id number]  . CATARACT EXTRACTION W/PHACO Right 12/09/2016   Procedure: CATARACT EXTRACTION PHACO AND INTRAOCULAR LENS PLACEMENT (IOC);  Surgeon: Birder Robson, MD;  Location: [facility] ORS;  Service: Ophthalmology;  Laterality: Right;  [place] 00:49 AP% 21.2 CDE 10.39 Fluid pack lot # [id number]  . CHOLECYSTECTOMY    . COLONOSCOPY WITH PROPOFOL N/A 11/22/2014   Procedure: COLONOSCOPY WITH PROPOFOL;  Surgeon: Manya Silvas, MD;  Location: [facility];  Service: Endoscopy;  Laterality: N/A;  . CYSTOSCOPY WITH STENT PLACEMENT Bilateral 11/27/2018   Procedure: CYSTOSCOPY WITH STENT PLACEMENT;  Surgeon: Festus Aloe, MD;  Location: [facility] ORS;  Service: Urology;  Laterality: Bilateral;  . CYSTOSCOPY/URETEROSCOPY/HOLMIUM LASER/STENT PLACEMENT Bilateral 12/17/2018   Procedure: CYSTOSCOPY/URETEROSCOPY/HOLMIUM LASER/STENT Exchange;  Surgeon: Billey Co, MD;  Location: [facility] ORS;  Service: Urology;  Laterality: Bilateral;  . ESOPHAGOGASTRODUODENOSCOPY  11/22/2014   Procedure: ESOPHAGOGASTRODUODENOSCOPY (EGD);  Surgeon: Manya Silvas, MD;  Location: [facility] ENDOSCOPY;  Service: Endoscopy;;  . ESOPHAGOGASTRODUODENOSCOPY (EGD) WITH PROPOFOL N/A 03/05/2017   Procedure: ESOPHAGOGASTRODUODENOSCOPY (EGD) WITH PROPOFOL;  Surgeon: Lin Landsman, MD;  Location[facility] END[facility]COPY;  Service: Gastroenterology;  Laterality: N/A;  . EXTRACORPOREAL SHOCK WAVE LITHOTRIPSY Right 03/04/2018   Procedure: EXTRACORPOREAL SHOCK WAVE LITHOTRIPSY (ESWL);  Surgeon: Billey Co, MD;  Location: [facility] ORS;  Service: Urology;  Laterality: Right;  . EYE SURGERY  Bilateral 2012   cataract extraction with iol  . GALLBLADDER SURGERY  1968  . hemi pelvectomy     left side at age 15  . HEMIPELVIC Left 1962  . LEG SURGERY Left    AMPUTATION d/t cancer at 86 years old  . MASTECTOMY, PARTIAL Left 07/27/2017   Procedure: MASTECTOMY PARTIAL;  Surgeon: Robert Bellow, MD;  Location: [facility] ORS;  Service: General;  Laterality: Left;  . MOUTH SURGERY  2019   teeth pulled with a bridge insertion.   fell out 12/16/18  . OVARY SURGERY Right    cyst removed  . SAVORY DILATION  11/22/2014   Procedure: SAVORY DILATION;  Surgeon: Manya Silvas, MD;  Location: [facility] ENDOSCOPY;  Service: Endoscopy;;  . SENTINEL NODE BIOPSY Left 07/27/2017   Procedure: SENTINEL NODE BIOPSY;  Surgeon: Robert Bellow, MD;  Location: [facility] ORS;  Service: General;  Laterality: Left;  . SKIN CANCER EXCISION     nose and arm and face  . TEE WITHOUT CARDIOVERSION N/A 07/25/2019   Procedure: TRANSESOPHAGEAL ECHOCARDIOGRAM (TEE);  Surgeon: Corey Skains, MD;  Location: [facility] ORS;  Service: Cardiovascular;  Laterality: N/A;  . TONSILLECTOMY      Social History Social History   Tobacco Use  . Smoking status: Never Smoker  . Smokeless tobacco: Never Used  Vaping Use  . Vaping Use: Never used  Substance Use Topics  . Alcohol use: No  . Drug use: No    Family History Family History  Problem Relation Age of Onset  . Breast cancer Paternal Aunt   . Ovarian cancer Sister   . Prostate cancer Father   . Stroke Mother   . Kidney cancer Neg Hx   . Bladder Cancer Neg Hx     Allergies  Allergen Reactions  . Ciprofloxacin Hives  . Codeine Other (See Comments)    HALLUCINATIONS  . Tape     Rash and skin irritation/ paper tape and tegaderm OK  . Ciprocinonide [Fluocinolone] Other (See Comments)    unknown  . Amoxicillin Other (See Comments)    Upset stomach Has patient had a PCN reaction causing immediate rash, facial/tongue/throat swelling, SOB or lightheadedness with  hypotension: No Has patient had a PCN reaction causing severe rash involving mucus membranes or skin necrosis: No Has patient had a PCN reaction that required hospitalization: No Has patient had a PCN reaction occurring within the last 10 years: Yes If all of the above answers are "NO", then may proceed with Cephalosporin use.;  . Cefuroxime Other (See Comments)    OK INTRACAMERALLY PER DR WLP, upset stomach  . Lipitor [Atorvastatin] Other (See Comments)    unknown     REVIEW OF SYSTEMS (Negative unless checked)  Constitutional: [] Weight loss  [] Fever  [] Chills Cardiac: [] Chest pain   [] Chest pressure   [] Palpitations   [] Shortness of breath when laying flat   [] Shortness of breath with exertion. Vascular:  [] Pain in legs with walking   [] Pain in legs at rest  [] History of DVT   [] Phlebitis   [] Swelling in legs   [] Varicose veins   [] Non-healing ulcers Pulmonary:   [] Uses home oxygen   [] Productive cough   [] Hemoptysis   [] Wheeze  [] COPD   [] Asthma Neurologic:  [] Dizziness   [] Seizures   [] History of stroke   [] History of TIA  [] Aphasia   [] Vissual changes   [] Weakness or numbness in arm   [] Weakness or numbness in leg Musculoskeletal:   [] Joint swelling   [] Joint pain   [] Low back pain Hematologic:  [] Easy bruising  [] Easy bleeding   [] Hypercoagulable state   [] Anemic Gastrointestinal:  [] Diarrhea   [] Vomiting  [] Gastroesophageal reflux/heartburn   [] Difficulty swallowing. Genitourinary:  [] Chronic kidney disease   [] Difficult urination  [] Frequent urination   [] Blood in urine Skin:  [] Rashes   [] Ulcers  Psychological:  [] History of anxiety   []  History of major depression.  Physical Examination  Vitals:   01/26/20 1104  BP: 125/61  Pulse: 92  Resp: 15  Weight: 159 lb (72.1 kg)   Body mass index is 29.08 kg/m. Gen: WD/WN, NAD Head: [place]/AT, No temporalis wasting.  Ear/Nose/Throat: Hearing grossly intact, nares w/o erythema or drainage Eyes: PER, EOMI, sclera nonicteric.  Neck:  Supple, no large masses.   Pulmonary:  Good air movement, no audible wheezing bilaterally, no use of accessory muscles.  Cardiac: RRR, no JVD Vascular:  Minimal right leg edema Vessel Right Left  Radial Palpable Palpable  PT Not Palpable AKA  DP Not Palpable AKA  Gastrointestinal: Non-distended. No guarding/no peritoneal signs.  Musculoskeletal: M/S 5/5 throughout.  No deformity or atrophy.  Neurologic: CN 2-12 intact. Symmetrical.  Speech is fluent. Motor exam as listed above. Psychiatric: Judgment intact, Mood & affect appropriate for pt's clinical situation. Dermatologic: No rashes or ulcers noted.  No changes consistent with cellulitis.   CBC Lab Results  Component Value Date   WBC 10.5 01/24/2020   HGB 9.0 (L) 01/24/2020   HCT 31.6 (L) 01/24/2020   MCV 72.0 (L) 01/24/2020   PLT 363 01/24/2020    BMET    Component Value Date/Time   NA 141 01/24/2020 1023   NA 139 07/01/2013 1721   K 5.1 01/24/2020 1023   K 4.6 07/01/2013 1721   CL 110 01/24/2020 1023   CL 108 (H) 07/01/2013 1721   CO2 22 01/24/2020 1023   CO2 23 07/01/2013 1721   GLUCOSE 236 (H) 01/24/2020 1023   GLUCOSE 150 (H) 07/01/2013 1721   BUN 25 (H) 01/24/2020 1023   BUN 23 01/20/2017 1134   BUN 21 (H) 07/01/2013 1721   CREATININE 0.88 01/24/2020 1023   CREATININE 0.95 07/01/2013 1721   CALCIUM 10.5 (H) 01/24/2020 1023   CALCIUM 11.2 (H) 07/01/2013 1721   GFRNONAA >60 01/24/2020 1023   GFRNONAA 58 (L) 07/01/2013 1721   GFRAA >60 01/24/2020 1023   GFRAA >60 07/01/2013 1721   Estimated Creatinine Clearance: 45 mL/min (by C-G formula based on SCr of 0.88 mg/dL).  COAG Lab Results  Component Value Date   INR 1.3 (H) 07/24/2019    Radiology No results found.   Assessment/Plan 1. Post-phlebitic syndrome Recommend:   No surgery or intervention at this point in time.  IVC filter is not indicated at present.  Patient's duplex ultrasound of the venous system shows DVT from the popliteal to the  femoral veins.  The patient is initiated on anticoagulation   Elevation was stressed, use of a recliner was discussed.  I have had a long discussion with the patient regarding DVT and post phlebitic changes such as swelling and why it causes symptoms such as pain. The patient will wear graduated compression wraps. The patient will beginning wearing the stockings first thing in the morning and removing them in the evening. The patient is instructed specifically not to sleep in the stockings. In addition, behavioral modification including elevation during the day and avoidance of prolonged dependency will be initiated.   The patient will continue anticoagulation for now as there have not been any problems or complications at this point.  2. Splenic infarct Patient is now on Pradaxa  3. Essential hypertension Continue antihypertensive medications as already ordered, these medications have been reviewed and there are no changes at this time.   4. Controlled type 2 diabetes mellitus without complication, without long-term current use of insulin ([place]) Continue hypoglycemic medications as already ordered, these medications have been reviewed and there are no changes at this time.  Hgb A1C to be monitored as already arranged by primary service   5. Coagulation disorder [facility]) She will be on lifelong anticoagulation   Hortencia Pilar, MD  01/26/2020 11:09 AM

## 2020-02-23 ENCOUNTER — Encounter: Payer: Self-pay | Admitting: Podiatry

## 2020-02-23 ENCOUNTER — Other Ambulatory Visit: Payer: Self-pay

## 2020-02-23 ENCOUNTER — Ambulatory Visit: Payer: Medicare Other | Admitting: Podiatry

## 2020-02-23 DIAGNOSIS — D689 Coagulation defect, unspecified: Secondary | ICD-10-CM | POA: Diagnosis not present

## 2020-02-23 DIAGNOSIS — E119 Type 2 diabetes mellitus without complications: Secondary | ICD-10-CM

## 2020-02-23 DIAGNOSIS — B351 Tinea unguium: Secondary | ICD-10-CM

## 2020-02-23 DIAGNOSIS — S88912D Complete traumatic amputation of left lower leg, level unspecified, subsequent encounter: Secondary | ICD-10-CM

## 2020-02-23 DIAGNOSIS — M79674 Pain in right toe(s): Secondary | ICD-10-CM

## 2020-02-23 NOTE — Progress Notes (Addendum)
This patient presents the office for continued preventative foot care services on her right foot. She hasn't history of an amputation of the left leg due to bone cancer at 26.  Patient is a type II diabetic.  Patient is presently taking pradaxa.  Patient states that the nails have grown thick and long and are painful walking and wearing her shoes.  She presents the office today for preventative foot care services.     General Appearance  Alert, conversant and in no acute stress.  Vascular  Dorsalis pedis and posterior tibial  pulses are palpable  right..  Capillary return is within normal limits right foot.. Temperature is within normal limits  Right foot.  Neurologic  Senn-Weinstein monofilament wire test within normal limits  . Muscle power within normal limits .  Nails Thick disfigured discolored nails with subungual debris  from hallux to fifth toes right. No evidence of bacterial infection or drainage   Orthopedic  No limitations of motion of motion feet .  No crepitus or effusions noted.  No bony pathology or digital deformities noted. Amputation left leg.  Skin  normotropic skin with no porokeratosis noted right.  No signs of infections or ulcers noted.    Onychomycosis  Amputation left leg.  Diabetes.     Debridement and grinding of nails right foot.    RTC 4 months   Gardiner Barefoot DPM

## 2020-04-19 ENCOUNTER — Telehealth (INDEPENDENT_AMBULATORY_CARE_PROVIDER_SITE_OTHER): Payer: Self-pay | Admitting: Vascular Surgery

## 2020-04-19 ENCOUNTER — Other Ambulatory Visit (INDEPENDENT_AMBULATORY_CARE_PROVIDER_SITE_OTHER): Payer: Self-pay | Admitting: Vascular Surgery

## 2020-04-19 DIAGNOSIS — S8011XS Contusion of right lower leg, sequela: Secondary | ICD-10-CM

## 2020-04-19 NOTE — Telephone Encounter (Signed)
I spoke with Dr Delana Meyer and he recommend for the patient to come in for right left dvt study if results are positive patient will be seen by provider. I left a message on the patient voicemail with date and time for schedule appointment.

## 2020-04-19 NOTE — Telephone Encounter (Addendum)
Called stating that she has a red bruise on her right knee, it has been there for about a week and a half. She states she's been wearing her compression socks and elevating her legs. She wants to come in to be seen. Patient was last seen 01/26/20 with abi studies (GS). Please advise.

## 2020-04-20 ENCOUNTER — Ambulatory Visit (INDEPENDENT_AMBULATORY_CARE_PROVIDER_SITE_OTHER): Payer: Medicare Other

## 2020-04-20 ENCOUNTER — Other Ambulatory Visit: Payer: Self-pay

## 2020-04-20 DIAGNOSIS — S8011XS Contusion of right lower leg, sequela: Secondary | ICD-10-CM

## 2020-06-06 ENCOUNTER — Telehealth (INDEPENDENT_AMBULATORY_CARE_PROVIDER_SITE_OTHER): Payer: Self-pay

## 2020-06-06 NOTE — Telephone Encounter (Signed)
Pt called and left a VM on the nurses line wanting the results from  Her U/S on 12/17 I called the pt and made her aware that the results were negative for DVT.

## 2020-06-25 ENCOUNTER — Other Ambulatory Visit: Payer: Self-pay

## 2020-06-25 ENCOUNTER — Ambulatory Visit (INDEPENDENT_AMBULATORY_CARE_PROVIDER_SITE_OTHER): Payer: Medicare Other | Admitting: Podiatry

## 2020-06-25 ENCOUNTER — Encounter: Payer: Self-pay | Admitting: Podiatry

## 2020-06-25 DIAGNOSIS — D689 Coagulation defect, unspecified: Secondary | ICD-10-CM

## 2020-06-25 DIAGNOSIS — E119 Type 2 diabetes mellitus without complications: Secondary | ICD-10-CM

## 2020-06-25 DIAGNOSIS — S88912D Complete traumatic amputation of left lower leg, level unspecified, subsequent encounter: Secondary | ICD-10-CM

## 2020-06-25 DIAGNOSIS — M79674 Pain in right toe(s): Secondary | ICD-10-CM | POA: Diagnosis not present

## 2020-06-25 DIAGNOSIS — B351 Tinea unguium: Secondary | ICD-10-CM | POA: Diagnosis not present

## 2020-06-25 NOTE — Progress Notes (Signed)
This patient presents the office for continued preventative foot care services on her right foot. She hasn't history of an amputation of the left leg due to bone cancer at 26.  Patient is a type II diabetic.  Patient is presently taking pradaxa.  Patient states that the nails have grown thick and long and are painful walking and wearing her shoes.  She presents the office today for preventative foot care services.     General Appearance  Alert, conversant and in no acute stress.  Vascular  Dorsalis pedis and posterior tibial  pulses are palpable  right..  Capillary return is within normal limits right foot.. Temperature is within normal limits  Right foot.  Neurologic  Senn-Weinstein monofilament wire test within normal limits  . Muscle power within normal limits .  Nails Thick disfigured discolored nails with subungual debris  from hallux to fifth toes right. No evidence of bacterial infection or drainage   Orthopedic  No limitations of motion of motion feet .  No crepitus or effusions noted.  No bony pathology or digital deformities noted. Amputation left leg.  Skin  normotropic skin with no porokeratosis noted right.  No signs of infections or ulcers noted.    Onychomycosis  Amputation left leg.  Diabetes.     Debridement and grinding of nails right foot.    RTC 3 months   Gardiner Barefoot DPM

## 2020-07-18 ENCOUNTER — Other Ambulatory Visit: Payer: Self-pay | Admitting: Oncology

## 2020-07-18 DIAGNOSIS — Z1231 Encounter for screening mammogram for malignant neoplasm of breast: Secondary | ICD-10-CM

## 2020-07-20 NOTE — Progress Notes (Signed)
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  Oncotype DX score 1.  Homozygous factor V Leiden.  INTERVAL HISTORY: Patient returns to clinic today for routine 85-month evaluation and continuation of Prolia.  She continues to feel well and is at her baseline.  She is tolerating Pradaxa and letrozole without significant side effects. She has no neurologic complaints.  She denies any recent fevers or illnesses.  She has a good appetite and denies weight loss.  She denies any chest pain, shortness of breath, cough, or hemoptysis.  She denies any nausea, vomiting, constipation, or diarrhea.  She has no urinary complaints.  Patient offers no specific complaints today.  REVIEW OF SYSTEMS:   Review of Systems  Constitutional: Negative.  Negative for fever, malaise/fatigue and weight loss.  Respiratory: Negative.  Negative for cough and shortness of breath.   Cardiovascular: Negative.  Negative for chest pain and leg swelling.  Gastrointestinal: Negative.  Negative for abdominal pain.  Genitourinary: Negative for dysuria and flank pain.  Musculoskeletal: Negative for joint pain.  Skin: Negative.  Negative for rash.  Neurological: Negative.  Negative for dizziness, sensory change, weakness and headaches.  Psychiatric/Behavioral: Negative.  The patient is not nervous/anxious.     As per HPI. Otherwise, a complete review of systems is negative.   PAST MEDICAL HISTORY: Reviewed and unchanged.  PAST SURGICAL HISTORY: Past Surgical History:  Procedure Laterality Date  . APPENDECTOMY  1962  .  BREAST BIOPSY Right 2006?   benign  . BREAST BIOPSY Left 2019   invasive mammary carcinoma  . BREAST CYST EXCISION Left 07/27/2017   Procedure: SKIN CYST EXCISED;  Surgeon: Robert Bellow, MD;  Location: [facility] ORS;  Service: General;  Laterality: Left;  . BREAST LUMPECTOMY Left 07/2017   invasive mammary, DCIS  mammosite  . BREAST SURGERY Right 2019  . CARPAL TUNNEL RELEASE Right   . CATARACT EXTRACTION W/PHACO Left 11/11/2016   Procedure: CATARACT EXTRACTION PHACO AND INTRAOCULAR LENS PLACEMENT (IOC);  Surgeon: Birder Robson, MD;  Location: [facility] ORS;  Service: Ophthalmology;  Laterality: Left;  [place] 01:07.9 AP% 19.2 CDE 13.02 Fluid pack lot # [id number]  . CATARACT EXTRACTION W/PHACO Right 12/09/2016   Procedure: CATARACT EXTRACTION PHACO AND INTRAOCULAR LENS PLACEMENT (IOC);  Surgeon: Birder Robson, MD;  Location: [facility];  Service: Ophthalmology;  Laterality: Right;  [place] 00:49 AP% 21.2 CDE 10.39 Fluid pack lot # [id number]  . CHOLECYSTECTOMY    . COLONOSCOPY WITH PROPOFOL N/A 11/22/2014   Procedure: COLONOSCOPY WITH PROPOFOL;  Surgeon: Manya Silvas, MD;  Location: [facility];  Service: Endoscopy;  Laterality: N/A;  . CYSTOSCOPY WITH STENT PLACEMENT Bilateral 11/27/2018   Procedure: CYSTOSCOPY WITH STENT PLACEMENT;  Surgeon: Festus Aloe, MD;  Location: [facility] ORS;  Service: Urology;  Laterality: Bilateral;  . CYSTOSCOPY/URETEROSCOPY/HOLMIUM LASER/STENT PLACEMENT Bilateral 12/17/2018   Procedure: CYSTOSCOPY/URETEROSCOPY/HOLMIUM LASER/STENT Exchange;  Surgeon: Billey Co, MD;  Location: [facility];  Service: Urology;  Laterality: Bilateral;  . ESOPHAGOGASTRODUODENOSCOPY  11/22/2014   Procedure: ESOPHAGOGASTRODUODENOSCOPY (EGD);  Surgeon: Manya Silvas, MD;  Location[facility] ENDOSCOPY;  Service: Endoscopy;;  . ESOPHAGOGASTRODUODENOSCOPY (EGD) WITH PROPOFOL N/A 03/05/2017   Procedure: ESOPHAGOGASTRODUODENOSCOPY (EGD)  WITH PROPOFOL;  Surgeon: Lin Landsman, MD;   Location: [facility] ENDOSCOPY;  Service: Gastroenterology;  Laterality: N/A;  . EXTRACORPOREAL SHOCK WAVE LITHOTRIPSY Right 03/04/2018   Procedure: EXTRACORPOREAL SHOCK WAVE LITHOTRIPSY (ESWL);  Surgeon: Billey Co, MD;  Location: [facility] ORS;  Service: Urology;  Laterality: Right;  . EYE SURGERY Bilateral 2012   cataract extraction with iol  . GALLBLADDER SURGERY  1968  . hemi pelvectomy     left side at age 74  . HEMIPELVIC Left 1962  . LEG SURGERY Left    AMPUTATION d/t cancer at 87 years old  . MASTECTOMY, PARTIAL Left 07/27/2017   Procedure: MASTECTOMY PARTIAL;  Surgeon: Robert Bellow, MD;  Location: [facility] ORS;  Service: General;  Laterality: Left;  . MOUTH SURGERY  2019   teeth pulled with a bridge insertion.   fell out 12/16/18  . OVARY SURGERY Right    cyst removed  . SAVORY DILATION  11/22/2014   Procedure: SAVORY DILATION;  Surgeon: Manya Silvas, MD;  Location: [facility] ENDOSCOPY;  Service: Endoscopy;;  . SENTINEL NODE BIOPSY Left 07/27/2017   Procedure: SENTINEL NODE BIOPSY;  Surgeon: Robert Bellow, MD;  Location: [facility] ORS;  Service: General;  Laterality: Left;  . SKIN CANCER EXCISION     nose and arm and face  . TEE WITHOUT CARDIOVERSION N/A 07/25/2019   Procedure: TRANSESOPHAGEAL ECHOCARDIOGRAM (TEE);  Surgeon: Corey Skains, MD;  Location: [facility] ORS;  Service: Cardiovascular;  Laterality: N/A;  . TONSILLECTOMY      FAMILY HISTORY: Family History  Problem Relation Age of Onset  . Breast cancer Paternal Aunt   . Ovarian cancer Sister   . Prostate cancer Father   . Stroke Mother   . Kidney cancer Neg Hx   . Bladder Cancer Neg Hx     ADVANCED DIRECTIVES (Y/N):  N  HEALTH MAINTENANCE: Social History   Tobacco Use  . Smoking status: Never Smoker  . Smokeless tobacco: Never Used  Vaping Use  . Vaping Use: Never used  Substance Use Topics  . Alcohol use: No  . Drug use: No     Colonoscopy:  PAP:  Bone density:  Lipid panel:  Allergies  Allergen  Reactions  . Ciprofloxacin Hives  . Codeine Other (See Comments)    HALLUCINATIONS  . Tape     Rash and skin irritation/ paper tape and tegaderm OK  . Ciprocinonide [Fluocinolone] Other (See Comments)    unknown  . Amoxicillin Other (See Comments)    Upset stomach Has patient had a PCN reaction causing immediate rash, facial/tongue/throat swelling, SOB or lightheadedness with hypotension: No Has patient had a PCN reaction causing severe rash involving mucus membranes or skin necrosis: No Has patient had a PCN reaction that required hospitalization: No Has patient had a PCN reaction occurring within the last 10 years: Yes If all of the above answers are "NO", then may proceed with Cephalosporin use.;  . Cefuroxime Other (See Comments)    OK INTRACAMERALLY PER DR WLP, upset stomach  . Lipitor [Atorvastatin] Other (See Comments)    unknown    Current Outpatient Medications  Medication Sig Dispense Refill  . Blood Glucose Monitoring Suppl (FIFTY50 GLUCOSE METER 2.0) w/Device KIT See admin instructions.    . dabigatran (PRADAXA) 150 MG CAPS capsule Take 1 capsule (150 mg total) by mouth 2 (two) times daily. 60 capsule 2  . fenofibrate 160 MG tablet TAKE ONE TABLET BY MOUTH EVERY DAY    . GLIPIZIDE XL  2.5 MG 24 hr tablet Take 2.5 mg by mouth daily.     Marland Kitchen letrozole (FEMARA) 2.5 MG tablet Take 1 tablet (2.5 mg total) by mouth daily. 90 tablet 3  . loratadine (CLARITIN) 10 MG tablet Take 10 mg by mouth daily.     Marland Kitchen losartan (COZAAR) 50 MG tablet Take 50 mg by mouth daily.    . metFORMIN (GLUCOPHAGE) 500 MG tablet Take 1,000 mg by mouth 2 (two) times daily.     . metoprolol succinate (TOPROL-XL) 25 MG 24 hr tablet Take 12.5 mg by mouth at bedtime.     . Multiple Vitamin (MULTIVITAMIN WITH MINERALS) TABS tablet Take 1 tablet by mouth daily.    Marland Kitchen nystatin (MYCOSTATIN/NYSTOP) powder Apply 1 application topically. Apply to right groin daily    . omeprazole (PRILOSEC) 40 MG capsule Take 40 mg by  mouth daily.     Marland Kitchen PARoxetine (PAXIL) 40 MG tablet Take 40 mg by mouth daily.     Vladimir Faster Glycol-Propyl Glycol 0.4-0.3 % SOLN Apply to eye 2 (two) times daily as needed.    . raloxifene (EVISTA) 60 MG tablet Take 60 mg by mouth daily.      No current facility-administered medications for this visit.    OBJECTIVE: Vitals:   07/24/20 1048  BP: (!) 111/46  Pulse: (!) 106  Resp: 20  Temp: 98.4 F (36.9 C)     There is no height or weight on file to calculate BMI.    ECOG FS:0 - Asymptomatic  General: Well-developed, well-nourished, no acute distress.  Sitting in wheelchair. Eyes: Pink conjunctiva, anicteric sclera. HEENT: Normocephalic, moist mucous membranes. Lungs: No audible wheezing or coughing. Heart: Regular rate and rhythm. Abdomen: Soft, nontender, no obvious distention. Musculoskeletal: No edema, cyanosis, or clubbing.  Left hemipelvectomy. Neuro: Alert, answering all questions appropriately. Cranial nerves grossly intact. Skin: No rashes or petechiae noted. Psych: Normal affect.   LAB RESULTS:  Lab Results  Component Value Date   NA 138 07/24/2020   K 4.3 07/24/2020   CL 108 07/24/2020   CO2 20 (L) 07/24/2020   GLUCOSE 228 (H) 07/24/2020   BUN 24 (H) 07/24/2020   CREATININE 0.77 07/24/2020   CALCIUM 10.7 (H) 07/24/2020   PROT 5.6 (L) 07/24/2019   ALBUMIN 2.8 (L) 07/24/2019   AST 17 07/24/2019   ALT 9 07/24/2019   ALKPHOS 35 (L) 07/24/2019   BILITOT 0.8 07/24/2019   GFRNONAA >60 07/24/2020   GFRAA >60 01/24/2020    Lab Results  Component Value Date   WBC 9.5 07/24/2020   NEUTROABS 6.7 07/24/2020   HGB 9.7 (L) 07/24/2020   HCT 34.5 (L) 07/24/2020   MCV 73.2 (L) 07/24/2020   PLT 312 07/24/2020     STUDIES: No results found.  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  Homozygous factor V Leiden.  PLAN:    1.  Pathologic stage Ia ER/PR  positive, HER-2 negative invasive carcinoma of the upper-outer quadrant of the left breast: Because of patient's low risk Oncotype DX score, she did not require adjuvant chemotherapy.  Patient had a lumpectomy on July 27, 2017.  She completed radiation with MammoSite treatment.  Continue letrozole for a total of 5 years completing treatment in May 2024.  We will coordinate mammogram and bone mineral density to occur on the same day and this will be scheduled in June 2022.  Return to clinic in  6 months for routine evaluation and continuation of treatment.     2.  Osteopenia: Patient's most recent bone mineral density on October 17, 2019 revealed a T score of -3.2 which is significantly worse than previous.  Repeat in June 2022 as above.  Patient continues to refuse treatment with Fosamax, but has agreed to continue Prolia every 6 months.  Continue calcium and vitamin D supplementation.  Return to clinic as above. 3.  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 4.  Homozygous factor V Leiden: Patient had multiple splenic infarcts suspicious for thrombosis while taking Xarelto.  Subsequent hypercoagulable work-up revealed the patient is homozygous for factor V Leiden.  Continue Pradaxa 150 mg every 12 hours.  She will require lifelong anticoagulation.    I spent a total of 30 minutes reviewing chart data, face-to-face evaluation with the patient, counseling and coordination of care as detailed above.   Patient expressed understanding and was in agreement with this plan. She also understands that She can call clinic at any time with any questions, concerns, or complaints.   Cancer Staging Primary cancer of upper outer quadrant of left female breast [facility]) Staging form: Breast, AJCC 8th Edition - Clinical stage from 06/12/2017: Stage IA (cT1b, cN0, cM0, G2, ER: Positive, PR: Positive, HER2: Negative) -  Signed by Lloyd Huger, MD on 06/12/2017 Histologic grading system: 3 grade system Laterality: Left   Lloyd Huger, MD   07/24/2020 12:27 PM

## 2020-07-24 ENCOUNTER — Inpatient Hospital Stay: Payer: Medicare Other | Attending: Oncology

## 2020-07-24 ENCOUNTER — Encounter: Payer: Self-pay | Admitting: Oncology

## 2020-07-24 ENCOUNTER — Inpatient Hospital Stay (HOSPITAL_BASED_OUTPATIENT_CLINIC_OR_DEPARTMENT_OTHER): Payer: Medicare Other | Admitting: Oncology

## 2020-07-24 ENCOUNTER — Inpatient Hospital Stay: Payer: Medicare Other

## 2020-07-24 VITALS — BP 111/46 | HR 106 | Temp 98.4°F | Resp 20

## 2020-07-24 DIAGNOSIS — M810 Age-related osteoporosis without current pathological fracture: Secondary | ICD-10-CM

## 2020-07-24 DIAGNOSIS — Z8042 Family history of malignant neoplasm of prostate: Secondary | ICD-10-CM | POA: Insufficient documentation

## 2020-07-24 DIAGNOSIS — Z79899 Other long term (current) drug therapy: Secondary | ICD-10-CM | POA: Insufficient documentation

## 2020-07-24 DIAGNOSIS — D689 Coagulation defect, unspecified: Secondary | ICD-10-CM

## 2020-07-24 DIAGNOSIS — M8580 Other specified disorders of bone density and structure, unspecified site: Secondary | ICD-10-CM | POA: Diagnosis not present

## 2020-07-24 DIAGNOSIS — D6851 Activated protein C resistance: Secondary | ICD-10-CM | POA: Insufficient documentation

## 2020-07-24 DIAGNOSIS — Z170 Estrogen receptor positive status [ER+]: Secondary | ICD-10-CM | POA: Insufficient documentation

## 2020-07-24 DIAGNOSIS — Z7901 Long term (current) use of anticoagulants: Secondary | ICD-10-CM | POA: Insufficient documentation

## 2020-07-24 DIAGNOSIS — Z79811 Long term (current) use of aromatase inhibitors: Secondary | ICD-10-CM | POA: Insufficient documentation

## 2020-07-24 DIAGNOSIS — Z8041 Family history of malignant neoplasm of ovary: Secondary | ICD-10-CM | POA: Diagnosis not present

## 2020-07-24 DIAGNOSIS — Z7984 Long term (current) use of oral hypoglycemic drugs: Secondary | ICD-10-CM | POA: Diagnosis not present

## 2020-07-24 DIAGNOSIS — C50412 Malignant neoplasm of upper-outer quadrant of left female breast: Secondary | ICD-10-CM | POA: Insufficient documentation

## 2020-07-24 DIAGNOSIS — Z803 Family history of malignant neoplasm of breast: Secondary | ICD-10-CM | POA: Insufficient documentation

## 2020-07-24 DIAGNOSIS — Z923 Personal history of irradiation: Secondary | ICD-10-CM | POA: Insufficient documentation

## 2020-07-24 DIAGNOSIS — Z9049 Acquired absence of other specified parts of digestive tract: Secondary | ICD-10-CM | POA: Diagnosis not present

## 2020-07-24 LAB — BASIC METABOLIC PANEL
Anion gap: 10 (ref 5–15)
BUN: 24 mg/dL — ABNORMAL HIGH (ref 8–23)
CO2: 20 mmol/L — ABNORMAL LOW (ref 22–32)
Calcium: 10.7 mg/dL — ABNORMAL HIGH (ref 8.9–10.3)
Chloride: 108 mmol/L (ref 98–111)
Creatinine, Ser: 0.77 mg/dL (ref 0.44–1.00)
GFR, Estimated: >60 mL/min (ref >60)
Glucose, Bld: 228 mg/dL — ABNORMAL HIGH (ref 70–99)
Potassium: 4.3 mmol/L (ref 3.5–5.1)
Sodium: 138 mmol/L (ref 135–145)

## 2020-07-24 LAB — CBC WITH DIFFERENTIAL/PLATELET
Abs Immature Granulocytes: 0.05 10*3/uL (ref 0.00–0.07)
Basophils Absolute: 0.1 10*3/uL (ref 0.0–0.1)
Basophils Relative: 1 %
Eosinophils Absolute: 0.2 10*3/uL (ref 0.0–0.5)
Eosinophils Relative: 2 %
HCT: 34.5 % — ABNORMAL LOW (ref 36.0–46.0)
Hemoglobin: 9.7 g/dL — ABNORMAL LOW (ref 12.0–15.0)
Immature Granulocytes: 1 %
Lymphocytes Relative: 20 %
Lymphs Abs: 1.9 10*3/uL (ref 0.7–4.0)
MCH: 20.6 pg — ABNORMAL LOW (ref 26.0–34.0)
MCHC: 28.1 g/dL — ABNORMAL LOW (ref 30.0–36.0)
MCV: 73.2 fL — ABNORMAL LOW (ref 80.0–100.0)
Monocytes Absolute: 0.6 10*3/uL (ref 0.1–1.0)
Monocytes Relative: 6 %
Neutro Abs: 6.7 10*3/uL (ref 1.7–7.7)
Neutrophils Relative %: 70 %
Platelets: 312 10*3/uL (ref 150–400)
RBC: 4.71 MIL/uL (ref 3.87–5.11)
RDW: 21.2 % — ABNORMAL HIGH (ref 11.5–15.5)
WBC: 9.5 10*3/uL (ref 4.0–10.5)
nRBC: 0 % (ref 0.0–0.2)

## 2020-07-24 MED ORDER — DENOSUMAB 60 MG/ML ~~LOC~~ SOSY
60.0000 mg | PREFILLED_SYRINGE | Freq: Once | SUBCUTANEOUS | Status: AC
  Administered 2020-07-24: 60 mg via SUBCUTANEOUS
  Filled 2020-07-24: qty 1

## 2020-07-24 NOTE — Progress Notes (Signed)
Patient here today for follow up regarding breast cancer, prolia injection. Patient denies any concerns today.

## 2020-07-25 ENCOUNTER — Other Ambulatory Visit (INDEPENDENT_AMBULATORY_CARE_PROVIDER_SITE_OTHER): Payer: Self-pay | Admitting: Vascular Surgery

## 2020-07-25 DIAGNOSIS — I739 Peripheral vascular disease, unspecified: Secondary | ICD-10-CM | POA: Insufficient documentation

## 2020-07-25 NOTE — Progress Notes (Signed)
MRN : [id number]  Julie Acosta is a 87 y.o. (24-Sep-1933) female who presents with chief complaint of  Chief Complaint  Patient presents with  . Follow-up    6 month ultrasound follow up  .  History of Present Illness:   The patient presents to the office forfollow upevaluation of DVT. DVT was identified at [place]. The initial symptoms were pain and swelling in the lower extremity.  The patient returns to the office for followup and review of the noninvasive studies. There have been no interval changes in lower extremity symptoms. No interval shortening of the patient's claudication distance or development of rest pain symptoms. No new ulcers or wounds have occurred since the last visit.  There have been no significant changes to the patient's overall health care.  The patient denies amaurosis fugax or recent TIA symptoms. There are no recent neurological changes noted. The patient denies history of DVT, PE or superficial thrombophlebitis. The patient denies recent episodes of angina or shortness of breath.   ABI Rt=0.85 and Lt=AKA  (previous ABI's Rt=1.04 and Lt=AKA) Duplex ultrasound of the right lower extremity has strong triphasic/biphasic waveforms with good toe waveforms. Current Meds  Medication Sig  . Blood Glucose Monitoring Suppl (FIFTY50 GLUCOSE METER 2.0) w/Device KIT See admin instructions.  . dabigatran (PRADAXA) 150 MG CAPS capsule Take 1 capsule (150 mg total) by mouth 2 (two) times daily.  . fenofibrate 160 MG tablet TAKE ONE TABLET BY MOUTH EVERY DAY  . GLIPIZIDE XL 2.5 MG 24 hr tablet Take 2.5 mg by mouth daily.   Marland Kitchen letrozole (FEMARA) 2.5 MG tablet Take 1 tablet (2.5 mg total) by mouth daily.  Marland Kitchen loratadine (CLARITIN) 10 MG tablet Take 10 mg by mouth daily.   Marland Kitchen losartan (COZAAR) 50 MG tablet Take 50 mg by mouth daily.  . metFORMIN (GLUCOPHAGE) 500 MG tablet Take 1,000 mg by mouth 2 (two) times daily.   . metoprolol succinate (TOPROL-XL) 25 MG 24  hr tablet Take 12.5 mg by mouth at bedtime.   . Multiple Vitamin (MULTIVITAMIN WITH MINERALS) TABS tablet Take 1 tablet by mouth daily.  Marland Kitchen nystatin (MYCOSTATIN/NYSTOP) powder Apply 1 application topically. Apply to right groin daily  . omeprazole (PRILOSEC) 40 MG capsule Take 40 mg by mouth daily.   Marland Kitchen PARoxetine (PAXIL) 40 MG tablet Take 40 mg by mouth daily.   Vladimir Faster Glycol-Propyl Glycol 0.4-0.3 % SOLN Apply to eye 2 (two) times daily as needed.  . raloxifene (EVISTA) 60 MG tablet Take 60 mg by mouth daily.     Past Medical History:  Diagnosis Date  . Anxiety   . Benign breast lumps   . Blood clotting disorder ([place])   . Bone cancer (HCC)    head of femur, left leg ... amputation at age 15  . Breast cancer of upper-outer quadrant of left female breast ([place]) 07/27/2017   11 mm invasive mammary carcinoma, ER 90%, PR 51-90%, negative margins.  Oncotype recurrence score: 1.  DCIS, margin less than 0.5 mm.  Negative sentinel node.  Accelerated partial breast radiation.  . Cervicalgia   . Chronic kidney disease    kidney stones  . Colon cancer [facility])    patient unaware of this  . Complication of anesthesia    mood alteration / not sure if d/t pain medicine or anesthesia as she passed out   . Cough    NASAL DRIP / SNEEZING / SORE THROAT MOSTLY CONSTANT  . Depression   .  Diabetes ([place])   . Diverticulitis   . Dizzy   . GERD (gastroesophageal reflux disease)   . H/O blood clots    arm and leg  . HBP (high blood pressure)   . Hematuria    gross  . Hemorrhoids   . HLD (hyperlipidemia)   . Muscle pain   . Osteoarthritis   . Osteoporosis   . Panic disorder   . Personal history of radiation therapy   . PND (post-nasal drip)    CHRONIC WITH SORE THROAT AND SNEEZING  . Reflux   . Skin cancer    nose  . Swelling   . Tremor   . Tremors of nervous system     Past Surgical History:  Procedure Laterality Date  . APPENDECTOMY  1962  . BREAST BIOPSY Right 2006?   benign  .  BREAST BIOPSY Left 2019   invasive mammary carcinoma  . BREAST CYST EXCISION Left 07/27/2017   Procedure: SKIN CYST EXCISED;  Surgeon: Robert Bellow, MD;  Location: [facility] ORS;  Service: General;  Laterality: Left;  . BREAST LUMPECTOMY Left 07/2017   invasive mammary, DCIS  mammosite  . BREAST SURGERY Right 2019  . CARPAL TUNNEL RELEASE Right   . CATARACT EXTRACTION W/PHACO Left 11/11/2016   Procedure: CATARACT EXTRACTION PHACO AND INTRAOCULAR LENS PLACEMENT (IOC);  Surgeon: Birder Robson, MD;  Location: [facility] OR[facility];  Service: Ophthalmology;  Laterality: Left;  [place] 01:07.9 AP% 19.2 CDE 13.02 Fluid pack lot # [id number]  . CATARACT EXTRACTION W/PHACO Right 12/09/2016   Procedure: CATARACT EXTRACTION PHACO AND INTRAOCULAR LENS PLACEMENT (IOC);  Surgeon: Birder Robson, MD;  Location: [facility];  Service: Ophthalmology;  Laterality: Right;  [place] 00:49 AP% 21.2 CDE 10.39 Fluid pack lot # [id number]  . CHOLECYSTECTOMY    . COLONOSCOPY WITH PROPOFOL N/A 11/22/2014   Procedure: COLONOSCOPY WITH PROPOFOL;  Surgeon: Manya Silvas, MD;  Location: [facility] ENDOSCOPY;  Service: Endoscopy;  Laterality: N/A;  . CYSTOSCOPY WITH STENT PLACEMENT Bilateral 11/27/2018   Procedure: CYSTOSCOPY WITH STENT PLACEMENT;  Surgeon: Festus Aloe, MD;  Location: [facility];  Service: Urology;  Laterality: Bilateral;  . CYSTOSCOPY/URETEROSCOPY/HOLMIUM LASER/STENT PLACEMENT Bilateral 12/17/2018   Procedure: CYSTOSCOPY/URETEROSCOPY/HOLMIUM LASER/STENT Exchange;  Surgeon: Billey Co, MD;  Location: [facility];  Service: Urology;  Laterality: Bilateral;  . ESOPHAGOGASTRODUODENOSCOPY  11/22/2014   Procedure: ESOPHAGOGASTRODUODENOSCOPY (EGD);  Surgeon: Manya Silvas, MD;  Location: [facility] ENDOSCOPY;  Service: Endoscopy;;  . ESOPHAGOGASTRODUODENOSCOPY (EGD) WITH PROPOFOL N/A 03/05/2017   Procedure: ESOPHAGOGASTRODUODENOSCOPY (EGD) WITH PROPOFOL;  Surgeon: Lin Landsman, MD;  Location[facility];  Service:  Gastroenterology;  Laterality: N/A;  . EXTRACORPOREAL SHOCK WAVE LITHOTRIPSY Right 03/04/2018   Procedure: EXTRACORPOREAL SHOCK WAVE LITHOTRIPSY (ESWL);  Surgeon: Billey Co, MD;  Location: [facility] ORS;  Service: Urology;  Laterality: Right;  . EYE SURGERY Bilateral 2012   cataract extraction with iol  . GALLBLADDER SURGERY  1968  . hemi pelvectomy     left side at age 14  . HEMIPELVIC Left 1962  . LEG SURGERY Left    AMPUTATION d/t cancer at 87 years old  . MASTECTOMY, PARTIAL Left 07/27/2017   Procedure: MASTECTOMY PARTIAL;  Surgeon: Robert Bellow, MD;  Location: [facility];  Service: General;  Laterality: Left;  . MOUTH SURGERY  2019   teeth pulled with a bridge insertion.   fell out 12/16/18  . OVARY SURGERY Right    cyst removed  . SAVORY DILATION  11/22/2014   Procedure: SAVORY DILATION;  Surgeon:  Manya Silvas, MD;  Location: [facility];  Service: Endoscopy;;  . SENTINEL NODE BIOPSY Left 07/27/2017   Procedure: SENTINEL NODE BIOPSY;  Surgeon: Robert Bellow, MD;  Location: [facility];  Service: General;  Laterality: Left;  . SKIN CANCER EXCISION     nose and arm and face  . TEE WITHOUT CARDIOVERSION N/A 07/25/2019   Procedure: TRANSESOPHAGEAL ECHOCARDIOGRAM (TEE);  Surgeon: Corey Skains, MD;  Location: [facility] OR[facility];  Service: Cardiovascular;  Laterality: N/A;  . TONSILLECTOMY      Social History Social History   Tobacco Use  . Smoking status: Never Smoker  . Smokeless tobacco: Never Used  Vaping Use  . Vaping Use: Never used  Substance Use Topics  . Alcohol use: No  . Drug use: No    Family History Family History  Problem Relation Age of Onset  . Breast cancer Paternal Aunt   . Ovarian cancer Sister   . Prostate cancer Father   . Stroke Mother   . Kidney cancer Neg Hx   . Bladder Cancer Neg Hx     Allergies  Allergen Reactions  . Ciprofloxacin Hives  . Codeine Other (See Comments)    HALLUCINATIONS  . Tape     Rash and skin irritation/ paper  tape and tegaderm OK  . Ciprocinonide [Fluocinolone] Other (See Comments)    unknown  . Amoxicillin Other (See Comments)    Upset stomach Has patient had a PCN reaction causing immediate rash, facial/tongue/throat swelling, SOB or lightheadedness with hypotension: No Has patient had a PCN reaction causing severe rash involving mucus membranes or skin necrosis: No Has patient had a PCN reaction that required hospitalization: No Has patient had a PCN reaction occurring within the last 10 years: Yes If all of the above answers are "NO", then may proceed with Cephalosporin use.;  . Cefuroxime Other (See Comments)    OK INTRACAMERALLY PER DR WLP, upset stomach  . Lipitor [Atorvastatin] Other (See Comments)    unknown     REVIEW OF SYSTEMS (Negative unless checked)  Constitutional: _0 Weight loss  _1 Fever  _2 Chills Cardiac: _3 Chest pain   _4 Chest pressure   _5 Palpitations   _6 Shortness of breath when laying flat   _7 Shortness of breath with exertion. Vascular:  _8 Pain in legs with walking   _9 Pain in legs at rest  _10 History of DVT   _11 Phlebitis   _12 Swelling in legs   _13 Varicose veins   _14 Non-healing ulcers Pulmonary:   _15 Uses home oxygen   _16 Productive cough   _17 Hemoptysis   _18 Wheeze  _19 COPD   _20 Asthma Neurologic:  _21 Dizziness   _22 Seizures   _23 History of stroke   _24 History of TIA  _25 Aphasia   _26 Vissual changes   _27 Weakness or numbness in arm   _28 Weakness or numbness in leg Musculoskeletal:   _29 Joint swelling   _30 Joint pain   _31 Low back pain Hematologic:  _32 Easy bruising  _33 Easy bleeding   _34 Hypercoagulable state   _35 Anemic Gastrointestinal:  _36 Diarrhea   _37 Vomiting  _38 Gastroesophageal reflux/heartburn   _39 Difficulty swallowing. Genitourinary:  _40 Chronic kidney disease   _41 Difficult urination  _42 Frequent urination   _43 Blood in urine Skin:  _44 Rashes   _45 Ulcers  Psychological:  _46 History of anxiety   _47  History of major depression.  Physical Examination  Vitals:   07/26/20 1354  BP: 107/61   Pulse: 92  Resp: 16   There is no height or weight on file to calculate BMI. Gen: WD/WN, NAD; patient seen in a wheelchair Head: [place]/AT, No temporalis wasting.  Ear/Nose/Throat:  Hearing grossly intact, nares w/o erythema or drainage Eyes: PER, EOMI, sclera nonicteric.  Neck: Supple, no large masses.   Pulmonary:  Good air movement, no audible wheezing bilaterally, no use of accessory muscles.  Cardiac: RRR, no JVD Vascular:  Vessel Right Left  Radial Palpable Palpable  PT Not alpable AKA  DP Not Palpable AKA  Gastrointestinal: Non-distended. No guarding/no peritoneal signs.  Musculoskeletal: M/S 5/5 throughout.  No deformity or atrophy.  Neurologic: CN 2-12 intact. Symmetrical.  Speech is fluent. Motor exam as listed above. Psychiatric: Judgment intact, Mood & affect appropriate for pt's clinical situation. Dermatologic: No rashes or ulcers noted.  No changes consistent with cellulitis. Lymph : No lichenification or skin changes of chronic lymphedema.  CBC Lab Results  Component Value Date   WBC 9.5 07/24/2020   HGB 9.7 (L) 07/24/2020   HCT 34.5 (L) 07/24/2020   MCV 73.2 (L) 07/24/2020   PLT 312 07/24/2020    BMET    Component Value Date/Time   NA 138 07/24/2020 1024   NA 139 07/01/2013 1721   K 4.3 07/24/2020 1024   K 4.6 07/01/2013 1721   CL 108 07/24/2020 1024   CL 108 (H) 07/01/2013 1721   CO2 20 (L) 07/24/2020 1024   CO2 23 07/01/2013 1721   GLUCOSE 228 (H) 07/24/2020 1024   GLUCOSE 150 (H) 07/01/2013 1721   BUN 24 (H) 07/24/2020 1024   BUN 23 01/20/2017 1134   BUN 21 (H) 07/01/2013 1721   CREATININE 0.77 07/24/2020 1024   CREATININE 0.95 07/01/2013 1721   CALCIUM 10.7 (H) 07/24/2020 1024   CALCIUM 11.2 (H) 07/01/2013 1721   GFRNONAA >60 07/24/2020 1024   GFRNONAA 58 (L) 07/01/2013 1721   GFRAA >60 01/24/2020 1023   GFRAA >60 07/01/2013 1721   CrCl cannot be calculated (Unknown ideal weight.).  COAG Lab Results  Component Value Date   INR 1.3 (H)  07/24/2019    Radiology ABI WITH/WO TBI  Result Date: 07/30/2020 LOWER EXTREMITY DOPPLER STUDY  Comparison Study: 01/26/2020 Performing Technologist: Charlane Ferretti RT (R)(VS)  Examination Guidelines: A complete evaluation includes at minimum, Doppler waveform signals and systolic blood pressure reading at the level of bilateral brachial, anterior tibial, and posterior tibial arteries, when vessel segments are accessible. Bilateral testing is considered an integral part of a complete examination. Photoelectric Plethysmograph (PPG) waveforms and toe systolic pressure readings are included as required and additional duplex testing as needed. Limited examinations for reoccurring indications may be performed as noted.  ABI Findings: +---------+------------------+-----+---------+--------+ Right    Rt Pressure (mmHg)IndexWaveform Comment  +---------+------------------+-----+---------+--------+ Brachial 142                                      +---------+------------------+-----+---------+--------+ ATA      120               0.81 triphasic         +---------+------------------+-----+---------+--------+ PTA      126               0.85 biphasic          +---------+------------------+-----+---------+--------+ Great Toe82                0.55 Abnormal          +---------+------------------+-----+---------+--------+ +--------+------------------+-----+--------+---------+ Left    Lt Pressure (mmHg)IndexWaveformComment   +--------+------------------+-----+--------+---------+ [id number]  Amputated +--------+------------------+-----+--------+---------+ +-------+-----------+-----------+------------+------------+ ABI/TBIToday's ABIToday's TBIPrevious ABIPrevious TBI +-------+-----------+-----------+------------+------------+ Right  .85        .55        .99         .77          +-------+-----------+-----------+------------+------------+ Right ABIs  appear decreased compared to prior study on 01/26/2020. Right TBIs appear decreased compared to prior study on 01/26/2020.  Summary: Right: Resting right ankle-brachial index indicates mild right lower extremity arterial disease. The right toe-brachial index is abnormal. *See table(s) above for measurements and observations.  Electronically signed by Hortencia Pilar MD on 07/30/2020 at 5:07:25 PM.   Final      Assessment/Plan 1. PAD (peripheral artery disease) (HCC)  Recommend:  The patient has evidence of atherosclerosis of the lower extremities with claudication.  The patient does not voice lifestyle limiting changes at this point in time.  Noninvasive studies do not suggest clinically significant change.  No invasive studies, angiography or surgery at this time The patient should continue walking and begin a more formal exercise program.  The patient should continue antiplatelet therapy and aggressive treatment of the lipid abnormalities  No changes in the patient's medications at this time  The patient should continue wearing graduated compression socks 10-15 mmHg strength to control the mild edema.   Patient will return in 1 year. 2. Primary hypertension Continue antihypertensive medications as already ordered, these medications have been reviewed and there are no changes at this time.   3. Controlled type 2 diabetes mellitus without complication, without long-term current use of insulin ([place]) Continue hypoglycemic medications as already ordered, these medications have been reviewed and there are no changes at this time.  Hgb A1C to be monitored as already arranged by primary service   4. Hyperlipidemia, unspecified hyperlipidemia type Continue statin as ordered and reviewed, no changes at this time     Kris Hartmann, NP  07/31/2020 12:28 AM

## 2020-07-26 ENCOUNTER — Other Ambulatory Visit: Payer: Self-pay

## 2020-07-26 ENCOUNTER — Ambulatory Visit (INDEPENDENT_AMBULATORY_CARE_PROVIDER_SITE_OTHER): Payer: Medicare Other | Admitting: Nurse Practitioner

## 2020-07-26 ENCOUNTER — Encounter (INDEPENDENT_AMBULATORY_CARE_PROVIDER_SITE_OTHER): Payer: Self-pay | Admitting: Nurse Practitioner

## 2020-07-26 ENCOUNTER — Ambulatory Visit (INDEPENDENT_AMBULATORY_CARE_PROVIDER_SITE_OTHER): Payer: Medicare Other

## 2020-07-26 VITALS — BP 107/61 | HR 92 | Resp 16

## 2020-07-26 DIAGNOSIS — I739 Peripheral vascular disease, unspecified: Secondary | ICD-10-CM | POA: Diagnosis not present

## 2020-07-26 DIAGNOSIS — I10 Essential (primary) hypertension: Secondary | ICD-10-CM

## 2020-07-26 DIAGNOSIS — E119 Type 2 diabetes mellitus without complications: Secondary | ICD-10-CM

## 2020-07-26 DIAGNOSIS — E785 Hyperlipidemia, unspecified: Secondary | ICD-10-CM | POA: Diagnosis not present

## 2020-07-31 ENCOUNTER — Encounter (INDEPENDENT_AMBULATORY_CARE_PROVIDER_SITE_OTHER): Payer: Self-pay | Admitting: Nurse Practitioner

## 2020-09-24 ENCOUNTER — Ambulatory Visit: Payer: Medicare Other | Admitting: Podiatry

## 2020-09-27 ENCOUNTER — Ambulatory Visit: Payer: Medicare Other | Admitting: Podiatry

## 2020-10-04 ENCOUNTER — Other Ambulatory Visit: Payer: Self-pay | Admitting: Oncology

## 2020-10-04 DIAGNOSIS — D689 Coagulation defect, unspecified: Secondary | ICD-10-CM

## 2020-10-18 ENCOUNTER — Ambulatory Visit
Admission: RE | Admit: 2020-10-18 | Discharge: 2020-10-18 | Disposition: A | Discharge status: Home / Self Care | Payer: Medicare Other | Source: Ambulatory Visit | Attending: Oncology | Admitting: Oncology

## 2020-10-18 ENCOUNTER — Other Ambulatory Visit: Payer: Self-pay

## 2020-10-18 DIAGNOSIS — Z1231 Encounter for screening mammogram for malignant neoplasm of breast: Secondary | ICD-10-CM | POA: Diagnosis present

## 2020-10-18 DIAGNOSIS — M810 Age-related osteoporosis without current pathological fracture: Secondary | ICD-10-CM

## 2020-11-16 ENCOUNTER — Other Ambulatory Visit: Payer: Self-pay | Admitting: Oncology

## 2020-12-03 ENCOUNTER — Other Ambulatory Visit: Payer: Self-pay | Admitting: Oncology

## 2020-12-03 DIAGNOSIS — D689 Coagulation defect, unspecified: Secondary | ICD-10-CM

## 2021-01-10 ENCOUNTER — Emergency Department
Admission: EM | Admit: 2021-01-10 | Discharge: 2021-01-10 | Disposition: A | Discharge status: Home / Self Care | Payer: Medicare Other | Attending: Emergency Medicine | Admitting: Emergency Medicine

## 2021-01-10 ENCOUNTER — Emergency Department: Payer: Medicare Other

## 2021-01-10 ENCOUNTER — Ambulatory Visit: Payer: Medicare Other | Admitting: Nurse Practitioner

## 2021-01-10 ENCOUNTER — Other Ambulatory Visit: Payer: Self-pay

## 2021-01-10 DIAGNOSIS — Z85038 Personal history of other malignant neoplasm of large intestine: Secondary | ICD-10-CM | POA: Insufficient documentation

## 2021-01-10 DIAGNOSIS — M1711 Unilateral primary osteoarthritis, right knee: Secondary | ICD-10-CM

## 2021-01-10 DIAGNOSIS — M1990 Unspecified osteoarthritis, unspecified site: Secondary | ICD-10-CM | POA: Diagnosis not present

## 2021-01-10 DIAGNOSIS — E785 Hyperlipidemia, unspecified: Secondary | ICD-10-CM | POA: Insufficient documentation

## 2021-01-10 DIAGNOSIS — E1169 Type 2 diabetes mellitus with other specified complication: Secondary | ICD-10-CM | POA: Insufficient documentation

## 2021-01-10 DIAGNOSIS — Z7984 Long term (current) use of oral hypoglycemic drugs: Secondary | ICD-10-CM | POA: Insufficient documentation

## 2021-01-10 DIAGNOSIS — Z853 Personal history of malignant neoplasm of breast: Secondary | ICD-10-CM | POA: Insufficient documentation

## 2021-01-10 DIAGNOSIS — I10 Essential (primary) hypertension: Secondary | ICD-10-CM | POA: Diagnosis not present

## 2021-01-10 DIAGNOSIS — W1839XA Other fall on same level, initial encounter: Secondary | ICD-10-CM | POA: Diagnosis not present

## 2021-01-10 DIAGNOSIS — Z85830 Personal history of malignant neoplasm of bone: Secondary | ICD-10-CM | POA: Diagnosis not present

## 2021-01-10 DIAGNOSIS — S0990XA Unspecified injury of head, initial encounter: Secondary | ICD-10-CM | POA: Insufficient documentation

## 2021-01-10 DIAGNOSIS — Z79899 Other long term (current) drug therapy: Secondary | ICD-10-CM | POA: Diagnosis not present

## 2021-01-10 DIAGNOSIS — G8929 Other chronic pain: Secondary | ICD-10-CM

## 2021-01-10 DIAGNOSIS — W19XXXA Unspecified fall, initial encounter: Secondary | ICD-10-CM

## 2021-01-10 LAB — BASIC METABOLIC PANEL
Anion gap: 7 (ref 5–15)
BUN: 25 mg/dL — ABNORMAL HIGH (ref 8–23)
CO2: 26 mmol/L (ref 22–32)
Calcium: 11.4 mg/dL — ABNORMAL HIGH (ref 8.9–10.3)
Chloride: 106 mmol/L (ref 98–111)
Creatinine, Ser: 0.74 mg/dL (ref 0.44–1.00)
GFR, Estimated: >60 mL/min (ref >60)
Glucose, Bld: 108 mg/dL — ABNORMAL HIGH (ref 70–99)
Potassium: 4.4 mmol/L (ref 3.5–5.1)
Sodium: 139 mmol/L (ref 135–145)

## 2021-01-10 LAB — CBC
HCT: 39.1 % (ref 36.0–46.0)
Hemoglobin: 11.5 g/dL — ABNORMAL LOW (ref 12.0–15.0)
MCH: 23 pg — ABNORMAL LOW (ref 26.0–34.0)
MCHC: 29.4 g/dL — ABNORMAL LOW (ref 30.0–36.0)
MCV: 78.2 fL — ABNORMAL LOW (ref 80.0–100.0)
Platelets: 340 10*3/uL (ref 150–400)
RBC: 5 MIL/uL (ref 3.87–5.11)
RDW: 23.2 % — ABNORMAL HIGH (ref 11.5–15.5)
WBC: 13.2 10*3/uL — ABNORMAL HIGH (ref 4.0–10.5)
nRBC: 0 % (ref 0.0–0.2)

## 2021-01-10 NOTE — ED Notes (Signed)
Patient to CT at this time

## 2021-01-10 NOTE — ED Notes (Signed)
First nurse-  pt brought in via ems from [facility] with a fall and weakness   pt alert

## 2021-01-10 NOTE — ED Provider Notes (Signed)
[facility] Emergency Department Provider Note   ____________________________________________   Event Date/Time   First MD Initiated Contact with Patient 01/10/21 2059     (approximate)  I have reviewed the triage vital signs and the nursing notes.   HISTORY  Chief Complaint Fall    HPI Julie Acosta is a 87 y.o. female who presents after a fall complaining of worsening right knee pain  LOCATION: Right knee DURATION: 3 weeks prior to arrival TIMING: Worsening since onset SEVERITY: Severe QUALITY: Aching pain CONTEXT: Patient has history of left AKA and states that she normally gets around in a wheelchair but over the last 3 weeks has had difficulty transferring to her toilet due to her right knee giving out as well as increasing pain MODIFYING FACTORS: Any movement of the right knee worsens this pain as well as bearing weight.  Laying down partially relieves this pain ASSOCIATED SYMPTOMS: Denies   Per medical record review, patient has history of left AKA          Past Medical History:  Diagnosis Date   Anxiety    Benign breast lumps    Blood clotting disorder (HCC)    Bone cancer (HCC)    head of femur, left leg ... amputation at age 63   Breast cancer of upper-outer quadrant of left female breast ([place]) 07/27/2017   11 mm invasive mammary carcinoma, ER 90%, PR 51-90%, negative margins.  Oncotype recurrence score: 1.  DCIS, margin less than 0.5 mm.  Negative sentinel node.  Accelerated partial breast radiation.   Cervicalgia    Chronic kidney disease    kidney stones   Colon cancer [facility])    patient unaware of this   Complication of anesthesia    mood alteration / not sure if d/t pain medicine or anesthesia as she passed out    Cough    NASAL DRIP / SNEEZING / SORE THROAT MOSTLY CONSTANT   Depression    Diabetes ([place])    Diverticulitis    Dizzy    GERD (gastroesophageal reflux disease)    H/O blood clots    arm and leg   HBP  (high blood pressure)    Hematuria    gross   Hemorrhoids    HLD (hyperlipidemia)    Muscle pain    Osteoarthritis    Osteoporosis    Panic disorder    Personal history of radiation therapy    PND (post-nasal drip)    CHRONIC WITH SORE THROAT AND SNEEZING   Reflux    Skin cancer    nose   Swelling    Tremor    Tremors of nervous system     Patient Active Problem List   Diagnosis Date Noted   PAD (peripheral artery disease) ([place]) 07/25/2020   Sepsis ([place])    Splenic infarct 07/23/2019   Coagulation disorder ([place]) 05/30/2019   Ureteral stone with hydronephrosis 11/26/2018   Pain due to onychomycosis of toenail of right foot 10/18/2018   S/P bilateral below knee amputation ([place]) 10/18/2018   Primary cancer of upper outer quadrant of left female breast ([place]) 06/12/2017   Diarrhea 01/16/2017   Hematuria 01/12/2017   Chronic diarrhea 01/12/2017   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   benign   BREAST BIOPSY Left 2019   invasive mammary carcinoma   BREAST CYST EXCISION Left 07/27/2017   Procedure: SKIN CYST EXCISED;  Surgeon: Robert Bellow, MD;  Location: [facility] ORS;  Service: General;  Laterality: Left;   BREAST LUMPECTOMY Left 07/2017   invasive mammary, DCIS  mammosite   BREAST SURGERY Right 2019   CARPAL TUNNEL RELEASE Right    CATARACT EXTRACTION W/PHACO Left 11/11/2016   Procedure: CATARACT EXTRACTION PHACO AND INTRAOCULAR LENS PLACEMENT ([place]);  Surgeon:  Birder Robson, MD;  Location: [facility] ORS;  Service: Ophthalmology;  Laterality: Left;  [place] 01:07.9 AP% 19.2 CDE 13.02 Fluid pack lot # [id number]   CATARACT EXTRACTION W/PHACO Right 12/09/2016   Procedure: CATARACT EXTRACTION PHACO AND INTRAOCULAR LENS PLACEMENT (IOC);  Surgeon: Birder Robson, MD;  Location: [facility] ORS;  Service: Ophthalmology;  Laterality: Right;  [place] 00:49 AP% 21.2 CDE 10.39 Fluid pack lot # [id number]   CHOLECYSTECTOMY     COLONOSCOPY WITH PROPOFOL N/A 11/22/2014   Procedure: COLONOSCOPY WITH PROPOFOL;  Surgeon: Manya Silvas, MD;  Location[facility] ENDOSCOPY;  Service: Endoscopy;  Laterality: N/A;   CYSTOSCOPY WITH STENT PLACEMENT Bilateral 11/27/2018   Procedure: CYSTOSCOPY WITH STENT PLACEMENT;  Surgeon: Festus Aloe, MD;  Location: [facility] ORS;  Service: Urology;  Laterality: Bilateral;   CYSTOSCOPY/URETEROSCOPY/HOLMIUM LASER/STENT PLACEMENT Bilateral 12/17/2018   Procedure: CYSTOSCOPY/URETEROSCOPY/HOLMIUM LASER/STENT Exchange;  Surgeon: Billey Co, MD;  Location: [facility] ORS;  Service: Urology;  Laterality: Bilateral;   ESOPHAGOGASTRODUODENOSCOPY  11/22/2014   Procedure: ESOPHAGOGASTRODUODENOSCOPY (EGD);  Surgeon: Manya Silvas, MD;  Location: [facility] ENDOSCOPY;  Service: Endoscopy;;   ESOPHAGOGASTRODUODENOSCOPY (EGD) WITH PROPOFOL N/A 03/05/2017   Procedure: ESOPHAGOGASTRODUODENOSCOPY (EGD) WITH PROPOFOL;  Surgeon: Lin Landsman, MD;  Location: [facility] ENDOSCOPY;  Service: Gastroenterology;  Laterality: N/A;   EXTRACORPOREAL SHOCK WAVE LITHOTRIPSY Right 03/04/2018   Procedure: EXTRACORPOREAL SHOCK WAVE LITHOTRIPSY (ESWL);  Surgeon: Billey Co, MD;  Location: [facility] ORS;  Service: Urology;  Laterality: Right;   EYE SURGERY Bilateral 2012   cataract extraction with iol   GALLBLADDER SURGERY  1968   hemi pelvectomy     left side at age 64   HEMIPELVIC Left [place] Left    AMPUTATION d/t cancer at 87 years old   MASTECTOMY, PARTIAL Left 07/27/2017    Procedure: MASTECTOMY PARTIAL;  Surgeon: Robert Bellow, MD;  Location: [facility] ORS;  Service: General;  Laterality: Left;   MOUTH SURGERY  2019   teeth pulled with a bridge insertion.   fell out 12/16/18   OVARY SURGERY Right    cyst removed   SAVORY DILATION  11/22/2014   Procedure: SAVORY DILATION;  Surgeon: Manya Silvas, MD;  Location: [facility] ENDOSCOPY;  Service: Endoscopy;;   SENTINEL NODE BIOPSY Left 07/27/2017   Procedure: SENTINEL NODE BIOPSY;  Surgeon: Robert Bellow, MD;  Location: [facility] ORS;  Service: General;  Laterality: Left;   SKIN CANCER EXCISION     nose and arm and face   TEE WITHOUT CARDIOVERSION N/A 07/25/2019   Procedure: TRANSESOPHAGEAL ECHOCARDIOGRAM (TEE);  Surgeon: Corey Skains, MD;  Location: [facility] ORS;  Service: Cardiovascular;  Laterality: N/A;   TONSILLECTOMY      Prior to Admission medications   Medication Sig Start Date End  Date Taking? Authorizing Provider  Blood Glucose Monitoring Suppl (FIFTY50 GLUCOSE METER 2.0) w/Device KIT See admin instructions. 02/13/20   [provider]  fenofibrate 160 MG tablet TAKE ONE TABLET BY MOUTH EVERY DAY 05/23/19   [provider]  GLIPIZIDE XL 2.5 MG 24 hr tablet Take 2.5 mg by mouth daily.  05/09/13   [provider]  letrozole (Quilcene) 2.5 MG tablet TAKE ONE TABLET BY MOUTH EVERY DAY 11/19/20   Lloyd Huger, MD  loratadine (CLARITIN) 10 MG tablet Take 10 mg by mouth daily.     [provider]  losartan (COZAAR) 50 MG tablet Take 50 mg by mouth daily. 10/19/19   [provider]  metFORMIN (GLUCOPHAGE) 500 MG tablet Take 1,000 mg by mouth 2 (two) times daily.  03/15/13   [provider]  metoprolol succinate (TOPROL-XL) 25 MG 24 hr tablet Take 12.5 mg by mouth at bedtime.  04/18/13   [provider]  Multiple Vitamin (MULTIVITAMIN WITH MINERALS) TABS tablet Take 1 tablet by mouth daily. 07/28/19   Nicole Kindred A, DO  nystatin (MYCOSTATIN/NYSTOP)  powder Apply 1 application topically. Apply to right groin daily    [provider]  omeprazole (PRILOSEC) 40 MG capsule Take 40 mg by mouth daily.  08/11/18   [provider]  PARoxetine (PAXIL) 40 MG tablet Take 40 mg by mouth daily.  02/28/13   [provider]  Polyethyl Glycol-Propyl Glycol 0.4-0.3 % SOLN Apply to eye 2 (two) times daily as needed.    [provider]  PRADAXA 150 MG CAPS capsule TAKE 1 CAPSULE BY MOUTH TWICE DAILY START TREATMENT 10 HRS LAST DOSE OF LOVENOX 12/03/20   Lloyd Huger, MD  raloxifene (EVISTA) 60 MG tablet Take 60 mg by mouth daily.  05/02/13   [provider]    Allergies Ciprofloxacin, Codeine, Tape, Ciprocinonide [fluocinolone], Amoxicillin, Cefuroxime, and Lipitor [atorvastatin]  Family History  Problem Relation Age of Onset   Breast cancer Paternal Aunt    Ovarian cancer Sister    Prostate cancer Father    Stroke Mother    Kidney cancer Neg Hx    Bladder Cancer Neg Hx     Social History Social History   Tobacco Use   Smoking status: Never   Smokeless tobacco: Never  Vaping Use   Vaping Use: Never used  Substance Use Topics   Alcohol use: No   Drug use: No    Review of Systems Constitutional: No fever/chills Eyes: No visual changes. ENT: No sore throat. Cardiovascular: Denies chest pain. Respiratory: Denies shortness of breath. Gastrointestinal: No abdominal pain.  No nausea, no vomiting.  No diarrhea. Genitourinary: Negative for dysuria. Musculoskeletal: Positive for right knee pain Skin: Negative for rash. Neurological: Negative for headaches, weakness/numbness/paresthesias in any extremity Psychiatric: Negative for suicidal ideation/homicidal ideation   ____________________________________________   PHYSICAL EXAM:  VITAL SIGNS: ED Triage Vitals  Enc Vitals Group     BP 01/10/21 1849 116/60     Pulse Rate 01/10/21 1849 (!) 120     Resp 01/10/21 1849 20     Temp 01/10/21  1849 98.9 F (37.2 C)     Temp Source 01/10/21 1849 Oral     SpO2 01/10/21 1849 97 %     Weight 01/10/21 1852 156 lb (70.8 kg)     Height 01/10/21 1852 5' (1.524 m)     Head Circumference --      Peak Flow --      Pain Score  01/10/21 1851 4     Pain Loc --      Pain Edu? --      Excl. in [place]? --    Constitutional: Alert and oriented. Well appearing and in no acute distress. Eyes: Conjunctivae are normal. PERRL. Head: Atraumatic. Nose: No congestion/rhinnorhea. Mouth/Throat: Mucous membranes are moist. Neck: No stridor Cardiovascular: Grossly normal heart sounds.  Good peripheral circulation. Respiratory: Normal respiratory effort.  No retractions. Gastrointestinal: Soft and nontender. No distention. Musculoskeletal: Left AKA, crepitus on range of motion of the right knee Neurologic:  Normal speech and language. No gross focal neurologic deficits are appreciated. Skin:  Skin is warm and dry. No rash noted. Psychiatric: Mood and affect are normal. Speech and behavior are normal.  ____________________________________________   LABS (all labs ordered are listed, but only abnormal results are displayed)  Labs Reviewed  CBC - Abnormal; Notable for the following components:      Result Value   WBC 13.2 (*)    Hemoglobin 11.5 (*)    MCV 78.2 (*)    MCH 23.0 (*)    MCHC 29.4 (*)    RDW 23.2 (*)    All other components within normal limits  BASIC METABOLIC PANEL - Abnormal; Notable for the following components:   Glucose, Bld 108 (*)    BUN 25 (*)    Calcium 11.4 (*)    All other components within normal limits   RADIOLOGY  ED MD interpretation: CT of the head without contrast shows no evidence of acute abnormalities including no intracerebral hemorrhage, obvious masses, or significant edema  CT of the cervical spine does not show any evidence of acute fractures or dislocations  X-ray of the right knee shows 3 compartment osteoarthritis without any evidence of acute  fracture or dislocation  Official radiology report(s): CT HEAD WO CONTRAST (5MM)  Result Date: 01/10/2021 CLINICAL DATA:  Head trauma, mod-severe Fall, weakness. EXAM: CT HEAD WITHOUT CONTRAST TECHNIQUE: Contiguous axial images were obtained from the base of the skull through the vertex without intravenous contrast. COMPARISON:  None. FINDINGS: Brain: No intracranial hemorrhage, mass effect, or midline shift. Normal for age atrophy. No hydrocephalus. The basilar cisterns are patent. Mild periventricular chronic small vessel ischemia. No evidence of territorial infarct or acute ischemia. No extra-axial or intracranial fluid collection. Vascular: Atherosclerosis of skullbase vasculature without hyperdense vessel or abnormal calcification. Skull: No fracture or focal lesion. Sinuses/Orbits: Paranasal sinuses and mastoid air cells are clear. The visualized orbits are unremarkable. Bilateral cataract resection. Other: None. IMPRESSION: 1. No acute intracranial abnormality. No skull fracture. 2. Age atrophy and chronic small vessel ischemia. Electronically Signed   By: Keith Rake M.D.   On: 01/10/2021 22:17   CT CERVICAL SPINE WO CONTRAST  Result Date: 01/10/2021 CLINICAL DATA:  Head trauma, mod-severe EXAM: CT CERVICAL SPINE WITHOUT CONTRAST TECHNIQUE: Multidetector CT imaging of the cervical spine was performed without intravenous contrast. Multiplanar CT image reconstructions were also generated. COMPARISON:  None. FINDINGS: Alignment: Broad-based reversal of normal lordosis. Trace anterolisthesis of C3 on C4. No traumatic subluxation. Skull base and vertebrae: No acute fracture. Vertebral body heights are maintained. The dens and skull base are intact. Soft tissues and spinal canal: No prevertebral fluid or swelling. No visible canal hematoma. Disc levels: Diffuse degenerative disc disease most prominent at C5-C6 and C6-C7 where there are posterior osteophytes. Multilevel facet hypertrophy. Upper chest:  No acute or unexpected findings. Other: None. IMPRESSION: 1. No acute fracture or subluxation of the cervical spine. 2. Broad-based  reversal of normal lordosis may be due to positioning or muscle spasm. 3. Multilevel degenerative disc disease and facet hypertrophy. Electronically Signed   By: Keith Rake M.D.   On: 01/10/2021 22:21   DG Knee Complete 4 Views Right  Result Date: 01/10/2021 CLINICAL DATA:  Injury.  Fall from couch this morning. EXAM: RIGHT KNEE - COMPLETE 4+ VIEW COMPARISON:  None. FINDINGS: No acute fracture or dislocation. Moderate medial tibiofemoral joint space narrowing. Moderate tricompartmental peripheral spurring. There are ossified intra-articular bodies. Small knee joint effusion. There is medial soft tissue edema. IMPRESSION: 1. No fracture or dislocation of the right knee. 2. Moderate tricompartmental osteoarthritis with ossified intra-articular bodies. Electronically Signed   By: Keith Rake M.D.   On: 01/10/2021 21:30    ____________________________________________   PROCEDURES  Procedure(s) performed (including Critical Care):  .1-3 Lead EKG Interpretation Performed by: Naaman Plummer, MD Authorized by: Naaman Plummer, MD     ____________________________________________   INITIAL IMPRESSION / ASSESSMENT AND PLAN / ED COURSE  As part of my medical decision making, I reviewed the following data within the electronic medical record, if available:  Nursing notes reviewed and incorporated, Labs reviewed, EKG interpreted, Old chart reviewed, Radiograph reviewed and Notes from prior ED visits reviewed and incorporated        Patient is a 87 year old female with a history of left AKA who presents for worsening right knee pain in the setting of severe osteoarthritis Given history, exam and workup I have low suspicion for fracture, dislocation, significant ligamentous injury, septic arthritis, gout flare, new autoimmune arthropathy, or gonococcal  arthropathy.  Interventions: CT of the head and neck not show any evidence of acute abnormalities X-ray of the right knee does not show any evidence of acute abnormalities Patient encouraged to follow-up with her orthopedic surgeon for further management of this right knee osteoarthritis Disposition: Discharge home with strict return precautions and instructions for prompt primary care follow up in the next week.      ____________________________________________   FINAL CLINICAL IMPRESSION(S) / ED DIAGNOSES  Final diagnoses:  Fall, initial encounter  Chronic pain of right knee  Arthritis of right knee     ED Discharge Orders     None        Note:  This document was prepared using Dragon voice recognition software and may include unintentional dictation errors.    Naaman Plummer, MD 01/10/21 [phone]

## 2021-01-10 NOTE — ED Triage Notes (Signed)
Pt to ED for fall from couch this morning. States her knee gave out making her fall.  States takes blood thinners and hit back of head. Also reports hitting whole right side of body.  Having trouble standing d/t right knee pain  Left leg amputee

## 2021-01-10 NOTE — ED Notes (Signed)
This RN attempted to call report to the facility x2, phone only rings and then goes to voicemail Will attempt to call report again Transport being set up for patient to return to [facility]

## 2021-01-10 NOTE — ED Notes (Signed)
Patient returned from CT at this time.

## 2021-01-15 DIAGNOSIS — E1159 Type 2 diabetes mellitus with other circulatory complications: Secondary | ICD-10-CM | POA: Diagnosis not present

## 2021-01-15 DIAGNOSIS — F39 Unspecified mood [affective] disorder: Secondary | ICD-10-CM

## 2021-01-15 DIAGNOSIS — M159 Polyosteoarthritis, unspecified: Secondary | ICD-10-CM | POA: Diagnosis not present

## 2021-01-15 DIAGNOSIS — M6281 Muscle weakness (generalized): Secondary | ICD-10-CM | POA: Diagnosis not present

## 2021-01-15 DIAGNOSIS — I10 Essential (primary) hypertension: Secondary | ICD-10-CM

## 2021-01-15 DIAGNOSIS — K219 Gastro-esophageal reflux disease without esophagitis: Secondary | ICD-10-CM

## 2021-01-22 ENCOUNTER — Inpatient Hospital Stay: Payer: Medicare Other

## 2021-01-22 ENCOUNTER — Inpatient Hospital Stay (HOSPITAL_BASED_OUTPATIENT_CLINIC_OR_DEPARTMENT_OTHER): Payer: Medicare Other | Admitting: Oncology

## 2021-01-22 ENCOUNTER — Inpatient Hospital Stay: Payer: Medicare Other | Attending: Oncology

## 2021-01-22 VITALS — BP 121/49 | HR 92 | Temp 98.7°F | Resp 18 | Wt 150.0 lb

## 2021-01-22 DIAGNOSIS — M810 Age-related osteoporosis without current pathological fracture: Secondary | ICD-10-CM

## 2021-01-22 DIAGNOSIS — Z79811 Long term (current) use of aromatase inhibitors: Secondary | ICD-10-CM | POA: Insufficient documentation

## 2021-01-22 DIAGNOSIS — M50322 Other cervical disc degeneration at C5-C6 level: Secondary | ICD-10-CM | POA: Diagnosis not present

## 2021-01-22 DIAGNOSIS — Z7984 Long term (current) use of oral hypoglycemic drugs: Secondary | ICD-10-CM | POA: Insufficient documentation

## 2021-01-22 DIAGNOSIS — Z791 Long term (current) use of non-steroidal anti-inflammatories (NSAID): Secondary | ICD-10-CM | POA: Insufficient documentation

## 2021-01-22 DIAGNOSIS — D6851 Activated protein C resistance: Secondary | ICD-10-CM | POA: Insufficient documentation

## 2021-01-22 DIAGNOSIS — E1136 Type 2 diabetes mellitus with diabetic cataract: Secondary | ICD-10-CM | POA: Insufficient documentation

## 2021-01-22 DIAGNOSIS — Z803 Family history of malignant neoplasm of breast: Secondary | ICD-10-CM | POA: Diagnosis not present

## 2021-01-22 DIAGNOSIS — I10 Essential (primary) hypertension: Secondary | ICD-10-CM | POA: Insufficient documentation

## 2021-01-22 DIAGNOSIS — Z79899 Other long term (current) drug therapy: Secondary | ICD-10-CM | POA: Diagnosis not present

## 2021-01-22 DIAGNOSIS — D689 Coagulation defect, unspecified: Secondary | ICD-10-CM

## 2021-01-22 DIAGNOSIS — C50412 Malignant neoplasm of upper-outer quadrant of left female breast: Secondary | ICD-10-CM | POA: Insufficient documentation

## 2021-01-22 DIAGNOSIS — I6782 Cerebral ischemia: Secondary | ICD-10-CM | POA: Insufficient documentation

## 2021-01-22 DIAGNOSIS — R197 Diarrhea, unspecified: Secondary | ICD-10-CM | POA: Insufficient documentation

## 2021-01-22 DIAGNOSIS — Z8041 Family history of malignant neoplasm of ovary: Secondary | ICD-10-CM | POA: Diagnosis not present

## 2021-01-22 DIAGNOSIS — Z170 Estrogen receptor positive status [ER+]: Secondary | ICD-10-CM | POA: Diagnosis not present

## 2021-01-22 DIAGNOSIS — M25469 Effusion, unspecified knee: Secondary | ICD-10-CM | POA: Diagnosis not present

## 2021-01-22 DIAGNOSIS — M4312 Spondylolisthesis, cervical region: Secondary | ICD-10-CM | POA: Insufficient documentation

## 2021-01-22 DIAGNOSIS — Z853 Personal history of malignant neoplasm of breast: Secondary | ICD-10-CM | POA: Insufficient documentation

## 2021-01-22 LAB — BASIC METABOLIC PANEL
Anion gap: 7 (ref 5–15)
BUN: 20 mg/dL (ref 8–23)
CO2: 24 mmol/L (ref 22–32)
Calcium: 10.6 mg/dL — ABNORMAL HIGH (ref 8.9–10.3)
Chloride: 105 mmol/L (ref 98–111)
Creatinine, Ser: 0.5 mg/dL (ref 0.44–1.00)
GFR, Estimated: >60 mL/min (ref >60)
Glucose, Bld: 262 mg/dL — ABNORMAL HIGH (ref 70–99)
Potassium: 4.1 mmol/L (ref 3.5–5.1)
Sodium: 136 mmol/L (ref 135–145)

## 2021-01-22 LAB — CBC WITH DIFFERENTIAL/PLATELET
Abs Immature Granulocytes: 0.08 10*3/uL — ABNORMAL HIGH (ref 0.00–0.07)
Basophils Absolute: 0.1 10*3/uL (ref 0.0–0.1)
Basophils Relative: 1 %
Eosinophils Absolute: 0.2 10*3/uL (ref 0.0–0.5)
Eosinophils Relative: 3 %
HCT: 39.2 % (ref 36.0–46.0)
Hemoglobin: 11.2 g/dL — ABNORMAL LOW (ref 12.0–15.0)
Immature Granulocytes: 1 %
Lymphocytes Relative: 22 %
Lymphs Abs: 1.6 10*3/uL (ref 0.7–4.0)
MCH: 23 pg — ABNORMAL LOW (ref 26.0–34.0)
MCHC: 28.6 g/dL — ABNORMAL LOW (ref 30.0–36.0)
MCV: 80.7 fL (ref 80.0–100.0)
Monocytes Absolute: 0.5 10*3/uL (ref 0.1–1.0)
Monocytes Relative: 7 %
Neutro Abs: 4.8 10*3/uL (ref 1.7–7.7)
Neutrophils Relative %: 66 %
Platelets: 221 10*3/uL (ref 150–400)
RBC: 4.86 MIL/uL (ref 3.87–5.11)
RDW: 22 % — ABNORMAL HIGH (ref 11.5–15.5)
WBC: 7.2 10*3/uL (ref 4.0–10.5)
nRBC: 0 % (ref 0.0–0.2)

## 2021-01-22 MED ORDER — DENOSUMAB 60 MG/ML ~~LOC~~ SOSY
60.0000 mg | PREFILLED_SYRINGE | Freq: Once | SUBCUTANEOUS | Status: AC
  Administered 2021-01-22: 60 mg via SUBCUTANEOUS
  Filled 2021-01-22: qty 1

## 2021-01-22 NOTE — Progress Notes (Signed)
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  Oncotype DX score 1.  Homozygous factor V Leiden.  HPI- Mrs. Catlin is an 87 year old female with past medical history significant for hypertension, PAD, type 2 diabetes, osteoporosis, depression who is followed by Dr. Grayland Ormond for left breast cancer and factor V Leiden mutation.  She is on Pradaxa for factor V Leiden, letrozole for breast cancer and receives Prolia every 6 months for osteoporosis.  INTERVAL HISTORY- She was evaluated at [facility] ED on 01/10/2021 for fall.  She had imaging of her head, cervical spine and right knee which was essentially unremarkable.  No evidence of dislocation or fracture.  She presents today with a caregiver.  She lives at [facility] but is currently in skilled nursing facility due to her fall and injury to her right knee. She is unable to ambulate due to right knee pain and history of a left AKI greater than 60 years ago.  Before her fall she was living independently.  She is currently undergoing intensive physical therapy. She has been having diarrhea greater than 6 episodes daily. Stool was checked recently and was negative per patient. She is scheduled for colonoscopy next month.  She has been compliant with her Pradaxa and letrozole.  She takes calcium and vitamin D daily.  She had a mammogram on 10/18/2020 that was BI-RADS Category 1 negative.  Repeat in 1 year.  She also had a bone density scan completed on 10/18/2020 which showed a T score of -3.5 which is slightly worse than before.  REVIEW OF SYSTEMS:    Review of Systems  Constitutional:  Positive for malaise/fatigue.  Gastrointestinal:  Positive for diarrhea.  Musculoskeletal:  Positive for joint pain.  Neurological:  Positive for weakness.  Psychiatric/Behavioral:  Positive for memory loss. The patient is nervous/anxious.    As per HPI. Otherwise, a complete review of systems is negative.   PAST MEDICAL HISTORY: Reviewed and unchanged.  PAST SURGICAL HISTORY: Past Surgical History:  Procedure Laterality Date  . APPENDECTOMY  1962  . BREAST BIOPSY Right 2006?   benign  . BREAST BIOPSY Left 2019   invasive mammary carcinoma  . BREAST CYST EXCISION Left 07/27/2017   Procedure: SKIN CYST EXCISED;  Surgeon: Robert Bellow, MD;  Location: [facility] ORS;  Service: General;  Laterality: Left;  . BREAST LUMPECTOMY Left 07/2017   invasive mammary, DCIS  mammosite  . BREAST SURGERY Right 2019  . CARPAL TUNNEL RELEASE Right   . CATARACT EXTRACTION W/PHACO Left 11/11/2016   Procedure: CATARACT EXTRACTION PHACO AND INTRAOCULAR LENS PLACEMENT (IOC);  Surgeon: Birder Robson, MD;  Location: [facility] ORS;  Service: Ophthalmology;  Laterality: Left;  [place] 01:07.9 AP% 19.2 CDE 13.02 Fluid pack lot # [id number]  . CATARACT EXTRACTION W/PHACO Right 12/09/2016   Procedure: CATARACT EXTRACTION PHACO AND INTRAOCULAR LENS PLACEMENT (IOC);  Surgeon: Birder Robson, MD;  Location: [facility] ORS;  Service: Ophthalmology;  Laterality: Right;  [place] 00:49 AP% 21.2 CDE 10.39 Fluid pack lot # [id number]  . CHOLECYSTECTOMY    . COLONOSCOPY WITH PROPOFOL N/A 11/22/2014   Procedure: COLONOSCOPY WITH PROPOFOL;  Surgeon: Manya Silvas, MD;  Location: [facility]  ENDOSCOPY;  Service: Endoscopy;  Laterality: N/A;  . CYSTOSCOPY WITH STENT PLACEMENT Bilateral 11/27/2018   Procedure: CYSTOSCOPY WITH STENT PLACEMENT;  Surgeon: Festus Aloe, MD;  Location: [facility] ORS;  Service: Urology;  Laterality: Bilateral;  . CYSTOSCOPY/URETEROSCOPY/HOLMIUM LASER/STENT PLACEMENT Bilateral  12/17/2018   Procedure: CYSTOSCOPY/URETEROSCOPY/HOLMIUM LASER/STENT Exchange;  Surgeon: Billey Co, MD;  Location: [facility] ORS;  Service: Urology;  Laterality: Bilateral;  . ESOPHAGOGASTRODUODENOSCOPY  11/22/2014   Procedure: ESOPHAGOGASTRODUODENOSCOPY (EGD);  Surgeon: Manya Silvas, MD;  Location: [facility] ENDOSCOPY;  Service: Endoscopy;;  . ESOPHAGOGASTRODUODENOSCOPY (EGD) WITH PROPOFOL N/A 03/05/2017   Procedure: ESOPHAGOGASTRODUODENOSCOPY (EGD) WITH PROPOFOL;  Surgeon: Lin Landsman, MD;  Location: [facility] ENDOSCOPY;  Service: Gastroenterology;  Laterality: N/A;  . EXTRACORPOREAL SHOCK WAVE LITHOTRIPSY Right 03/04/2018   Procedure: EXTRACORPOREAL SHOCK WAVE LITHOTRIPSY (ESWL);  Surgeon: Billey Co, MD;  Location: [facility] ORS;  Service: Urology;  Laterality: Right;  . EYE SURGERY Bilateral 2012   cataract extraction with iol  . GALLBLADDER SURGERY  1968  . hemi pelvectomy     left side at age 89  . HEMIPELVIC Left 1962  . LEG SURGERY Left    AMPUTATION d/t cancer at 87 years old  . MASTECTOMY, PARTIAL Left 07/27/2017   Procedure: MASTECTOMY PARTIAL;  Surgeon: Robert Bellow, MD;  Location: [facility] ORS;  Service: General;  Laterality: Left;  . MOUTH SURGERY  2019   teeth pulled with a bridge insertion.   fell out 12/16/18  . OVARY SURGERY Right    cyst removed  . SAVORY DILATION  11/22/2014   Procedure: SAVORY DILATION;  Surgeon: Manya Silvas, MD;  Location: [facility] ENDOSCOPY;  Service: Endoscopy;;  . SENTINEL NODE BIOPSY Left 07/27/2017   Procedure: SENTINEL NODE BIOPSY;  Surgeon: Robert Bellow, MD;  Location: [facility] ORS;  Service: General;  Laterality: Left;  . SKIN CANCER EXCISION     nose and arm and face  . TEE WITHOUT CARDIOVERSION N/A 07/25/2019   Procedure: TRANSESOPHAGEAL ECHOCARDIOGRAM (TEE);  Surgeon: Corey Skains, MD;  Location: [facility] ORS;  Service: Cardiovascular;  Laterality: N/A;  . TONSILLECTOMY      FAMILY HISTORY: Family History  Problem Relation Age of  Onset  . Breast cancer Paternal Aunt   . Ovarian cancer Sister   . Prostate cancer Father   . Stroke Mother   . Kidney cancer Neg Hx   . Bladder Cancer Neg Hx     ADVANCED DIRECTIVES (Y/N):  N  HEALTH MAINTENANCE: Social History   Tobacco Use  . Smoking status: Never  . Smokeless tobacco: Never  Vaping Use  . Vaping Use: Never used  Substance Use Topics  . Alcohol use: No  . Drug use: No     Colonoscopy:  PAP:  Bone density:  Lipid panel:  Allergies  Allergen Reactions  . Ciprofloxacin Hives  . Codeine Other (See Comments)    HALLUCINATIONS  . Tape     Rash and skin irritation/ paper tape and tegaderm OK  . Ciprocinonide [Fluocinolone] Other (See Comments)    unknown  . Amoxicillin Other (See Comments)    Upset stomach Has patient had a PCN reaction causing immediate rash, facial/tongue/throat swelling, SOB or lightheadedness with hypotension: No Has patient had a PCN reaction causing severe rash involving mucus membranes or skin necrosis: No Has patient had a PCN reaction that required hospitalization: No Has patient had a PCN reaction occurring within the last 10 years: Yes If all of the above answers are "NO", then may proceed  with Cephalosporin use.;  . Cefuroxime Other (See Comments)    OK INTRACAMERALLY PER DR WLP, upset stomach  . Lipitor [Atorvastatin] Other (See Comments)    unknown    Current Outpatient Medications  Medication Sig Dispense Refill  . acetaminophen (TYLENOL) 500 MG tablet Take 500 mg by mouth every 6 (six) hours as needed.    Marland Kitchen acetaminophen (TYLENOL) 650 MG CR tablet Take 650 mg by mouth every 8 (eight) hours as needed for pain.    . Biotin 10 MG TABS Take by mouth.    . Cholecalciferol 25 MCG (1000 UT) tablet Take by mouth.    . Dentifrices (BIOTENE DRY MOUTH DT) by Transmucosal route.    . diclofenac Sodium (VOLTAREN) 1 % GEL Apply topically 4 (four) times daily.    Marland Kitchen GLIPIZIDE XL 2.5 MG 24 hr tablet Take 2.5 mg by mouth daily.      Marland Kitchen letrozole (FEMARA) 2.5 MG tablet TAKE ONE TABLET BY MOUTH EVERY DAY 90 tablet 3  . losartan (COZAAR) 50 MG tablet Take 50 mg by mouth daily.    . metFORMIN (GLUCOPHAGE) 500 MG tablet Take 1,000 mg by mouth 2 (two) times daily.     . metoprolol succinate (TOPROL-XL) 25 MG 24 hr tablet Take 12.5 mg by mouth at bedtime.     . Multiple Vitamin (MULTIVITAMIN WITH MINERALS) TABS tablet Take 1 tablet by mouth daily.    . nitrofurantoin, macrocrystal-monohydrate, (MACROBID) 100 MG capsule Take 100 mg by mouth 2 (two) times daily.    Marland Kitchen nystatin (MYCOSTATIN/NYSTOP) powder Apply 1 application topically. Apply to right groin daily    . omeprazole (PRILOSEC) 40 MG capsule Take 40 mg by mouth daily.     Marland Kitchen PARoxetine (PAXIL) 40 MG tablet Take 40 mg by mouth daily.     Vladimir Faster Glycol-Propyl Glycol 0.4-0.3 % SOLN Apply to eye 2 (two) times daily as needed.    Marland Kitchen PRADAXA 150 MG CAPS capsule TAKE 1 CAPSULE BY MOUTH TWICE DAILY START TREATMENT 10 HRS LAST DOSE OF LOVENOX 60 capsule 2  . raloxifene (EVISTA) 60 MG tablet Take 60 mg by mouth daily.     . baclofen (LIORESAL) 10 MG tablet as needed. (Patient not taking: Reported on 01/22/2021)    . Blood Glucose Monitoring Suppl (FIFTY50 GLUCOSE METER 2.0) w/Device KIT See admin instructions. (Patient not taking: Reported on 01/22/2021)    . fenofibrate 160 MG tablet TAKE ONE TABLET BY MOUTH EVERY DAY (Patient not taking: Reported on 01/22/2021)    . loratadine (CLARITIN) 10 MG tablet Take 10 mg by mouth daily.  (Patient not taking: Reported on 01/22/2021)     No current facility-administered medications for this visit.    OBJECTIVE: Vitals:   01/22/21 1050  BP: (!) 121/49  Pulse: 92  Resp: 18  Temp: 98.7 F (37.1 C)  SpO2: 95%     Body mass index is 29.29 kg/m.    ECOG FS:0 - Asymptomatic  Physical Exam Constitutional:      Appearance: Normal appearance. She is obese.  HENT:     Head: Normocephalic and atraumatic.  Eyes:     Pupils: Pupils are  equal, round, and reactive to light.  Cardiovascular:     Rate and Rhythm: Normal rate and regular rhythm.     Heart sounds: Normal heart sounds. No murmur heard. Pulmonary:     Effort: Pulmonary effort is normal.     Breath sounds: Normal breath sounds. No wheezing.  Abdominal:  General: Bowel sounds are normal. There is no distension.     Palpations: Abdomen is soft.     Tenderness: There is no abdominal tenderness.  Musculoskeletal:        General: Normal range of motion.     Cervical back: Normal range of motion.     Comments: Left AKI.  Skin:    General: Skin is warm and dry.     Findings: No rash.  Neurological:     Mental Status: She is alert and oriented to person, place, and time.  Psychiatric:        Judgment: Judgment normal.     LAB RESULTS:  Lab Results  Component Value Date   NA 136 01/22/2021   K 4.1 01/22/2021   CL 105 01/22/2021   CO2 24 01/22/2021   GLUCOSE 262 (H) 01/22/2021   BUN 20 01/22/2021   CREATININE 0.50 01/22/2021   CALCIUM 10.6 (H) 01/22/2021   PROT 5.6 (L) 07/24/2019   ALBUMIN 2.8 (L) 07/24/2019   AST 17 07/24/2019   ALT 9 07/24/2019   ALKPHOS 35 (L) 07/24/2019   BILITOT 0.8 07/24/2019   GFRNONAA >60 01/22/2021   GFRAA >60 01/24/2020    Lab Results  Component Value Date   WBC 7.2 01/22/2021   NEUTROABS 4.8 01/22/2021   HGB 11.2 (L) 01/22/2021   HCT 39.2 01/22/2021   MCV 80.7 01/22/2021   PLT 221 01/22/2021     STUDIES: CT HEAD WO CONTRAST (5MM)  Result Date: 01/10/2021 CLINICAL DATA:  Head trauma, mod-severe Fall, weakness. EXAM: CT HEAD WITHOUT CONTRAST TECHNIQUE: Contiguous axial images were obtained from the base of the skull through the vertex without intravenous contrast. COMPARISON:  None. FINDINGS: Brain: No intracranial hemorrhage, mass effect, or midline shift. Normal for age atrophy. No hydrocephalus. The basilar cisterns are patent. Mild periventricular chronic small vessel ischemia. No evidence of territorial  infarct or acute ischemia. No extra-axial or intracranial fluid collection. Vascular: Atherosclerosis of skullbase vasculature without hyperdense vessel or abnormal calcification. Skull: No fracture or focal lesion. Sinuses/Orbits: Paranasal sinuses and mastoid air cells are clear. The visualized orbits are unremarkable. Bilateral cataract resection. Other: None. IMPRESSION: 1. No acute intracranial abnormality. No skull fracture. 2. Age atrophy and chronic small vessel ischemia. Electronically Signed   By: Keith Rake M.D.   On: 01/10/2021 22:17   CT CERVICAL SPINE WO CONTRAST  Result Date: 01/10/2021 CLINICAL DATA:  Head trauma, mod-severe EXAM: CT CERVICAL SPINE WITHOUT CONTRAST TECHNIQUE: Multidetector CT imaging of the cervical spine was performed without intravenous contrast. Multiplanar CT image reconstructions were also generated. COMPARISON:  None. FINDINGS: Alignment: Broad-based reversal of normal lordosis. Trace anterolisthesis of C3 on C4. No traumatic subluxation. Skull base and vertebrae: No acute fracture. Vertebral body heights are maintained. The dens and skull base are intact. Soft tissues and spinal canal: No prevertebral fluid or swelling. No visible canal hematoma. Disc levels: Diffuse degenerative disc disease most prominent at C5-C6 and C6-C7 where there are posterior osteophytes. Multilevel facet hypertrophy. Upper chest: No acute or unexpected findings. Other: None. IMPRESSION: 1. No acute fracture or subluxation of the cervical spine. 2. Broad-based reversal of normal lordosis may be due to positioning or muscle spasm. 3. Multilevel degenerative disc disease and facet hypertrophy. Electronically Signed   By: Keith Rake M.D.   On: 01/10/2021 22:21   DG Knee Complete 4 Views Right  Result Date: 01/10/2021 CLINICAL DATA:  Injury.  Fall from couch this morning. EXAM: RIGHT KNEE - COMPLETE 4+  VIEW COMPARISON:  None. FINDINGS: No acute fracture or dislocation. Moderate medial  tibiofemoral joint space narrowing. Moderate tricompartmental peripheral spurring. There are ossified intra-articular bodies. Small knee joint effusion. There is medial soft tissue edema. IMPRESSION: 1. No fracture or dislocation of the right knee. 2. Moderate tricompartmental osteoarthritis with ossified intra-articular bodies. Electronically Signed   By: Keith Rake M.D.   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  Homozygous factor V Leiden.  PLAN:    1.  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  She had a lumpectomy on 07/27/2017.  Completed radiation in April 2019.  She was started on letrozole shortly after and will complete in May 2024.  She had a mammogram on 10/18/2020 which was BI-RADS Category 1 negative.  Repeat in 1 year.  2.  Osteopenia:  Bone density from 10/18/2020 showed a T score of -3.5 (-3.2).  She is currently on Prolia every 6 months.  Continue calcium and vitamin D.  Calcium level 10.6.  Proceed with Prolia.  3.  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  4.  Homozygous factor V Leiden:  She is currently on Pradaxa.  She had multiple splenic infarcts suspicious for thrombus while taking Xarelto.  Hypercoagulable work-up revealed the patient has homozygous factor V Leiden.  She will continue Pradaxa indefinitely.  5.  Recent fall: Negative imaging while in ED. Continue PT. She is currently in SNF at [facility].   Disposition- Proceed with Prolia today.  Continue letrozole and Pradaxa daily.  Continue calcium and vitamin D.  RTC in 6 months for Prolia, MD assessment and labs.  I  spent 20 minutes dedicated to the care of this patient (face-to-face and non-face-to-face) on the date of the encounter to include what is described in the assessment and plan.   Patient expressed understanding and was in agreement with this plan. She also understands that She can call clinic at any time with any questions, concerns, or complaints.   Cancer Staging Primary cancer of upper outer quadrant of left female breast [facility]) Staging form: Breast, AJCC 8th Edition - Clinical stage from 06/12/2017: Stage IA (cT1b, cN0, cM0, G2, ER+, PR+, HER2-) - Signed by Lloyd Huger, MD on 06/12/2017 Histologic grading system: 3 grade system Laterality: Left   Jacquelin Hawking, NP   01/22/2021 11:39 AM

## 2021-01-22 NOTE — Progress Notes (Signed)
Patient reports not sleeping well, diarrhea, left ear pain, sore throat all the time, hand shakes, and she wants to get her vision checked.

## 2021-01-23 ENCOUNTER — Other Ambulatory Visit (INDEPENDENT_AMBULATORY_CARE_PROVIDER_SITE_OTHER): Payer: Self-pay | Admitting: Vascular Surgery

## 2021-01-23 DIAGNOSIS — I7090 Unspecified atherosclerosis: Secondary | ICD-10-CM

## 2021-01-24 ENCOUNTER — Ambulatory Visit (INDEPENDENT_AMBULATORY_CARE_PROVIDER_SITE_OTHER): Payer: Medicare Other | Admitting: Vascular Surgery

## 2021-01-24 ENCOUNTER — Encounter (INDEPENDENT_AMBULATORY_CARE_PROVIDER_SITE_OTHER): Payer: Self-pay | Admitting: Vascular Surgery

## 2021-01-24 ENCOUNTER — Other Ambulatory Visit: Payer: Self-pay

## 2021-01-24 ENCOUNTER — Ambulatory Visit (INDEPENDENT_AMBULATORY_CARE_PROVIDER_SITE_OTHER): Payer: Medicare Other

## 2021-01-24 VITALS — BP 129/70 | HR 83 | Resp 16

## 2021-01-24 DIAGNOSIS — I739 Peripheral vascular disease, unspecified: Secondary | ICD-10-CM | POA: Diagnosis not present

## 2021-01-24 DIAGNOSIS — I7090 Unspecified atherosclerosis: Secondary | ICD-10-CM | POA: Diagnosis not present

## 2021-01-24 DIAGNOSIS — E119 Type 2 diabetes mellitus without complications: Secondary | ICD-10-CM

## 2021-01-24 DIAGNOSIS — I10 Essential (primary) hypertension: Secondary | ICD-10-CM

## 2021-01-24 DIAGNOSIS — D689 Coagulation defect, unspecified: Secondary | ICD-10-CM

## 2021-01-24 NOTE — Progress Notes (Signed)
MRN : [id number]  Julie Acosta is a 87 y.o. (06-17-1933) female who presents with chief complaint of leg check up.  History of Present Illness:   The patient presents to the office for follow up evaluation of DVT.  DVT was identified at [facility] remotely.  The initial symptoms were pain and swelling in the lower extremity.   The patient notes her right leg has been less painful with dependency and swells some but is well controled with a Sigvaris compression wrap.  Symptoms are much better with elevation.  The patient notes minimal edema in the morning which steadily worsens throughout the day if not wearing compression.     The patient has been using compression therapy at this point.   No SOB or pleuritic chest pains.  No cough or hemoptysis.   In the past she developed left flank pain and was found to have multiple splenic infarcts while taking Xarelto so she has been changed to Pradaxa.   ABI's Rt=1.12 and Lt=AKA  (previous ABI's Rt=1.04 and the Lt=AKA)  No outpatient medications have been marked as taking for the 01/24/21 encounter (Appointment) with Delana Meyer, Dolores Lory, MD.    Past Medical History:  Diagnosis Date   Anxiety    Benign breast lumps    Blood clotting disorder ([place])    Bone cancer ([place])    head of femur, left leg ... amputation at age 88   Breast cancer of upper-outer quadrant of left female breast ([place]) 07/27/2017   11 mm invasive mammary carcinoma, ER 90%, PR 51-90%, negative margins.  Oncotype recurrence score: 1.  DCIS, margin less than 0.5 mm.  Negative sentinel node.  Accelerated partial breast radiation.   Cervicalgia    Chronic kidney disease    kidney stones   Colon cancer [facility])    patient unaware of this   Complication of anesthesia    mood alteration / not sure if d/t pain medicine or anesthesia as she passed out    Cough    NASAL DRIP / SNEEZING / SORE THROAT MOSTLY CONSTANT   Depression    Diabetes ([place])    Diverticulitis    Dizzy    GERD  (gastroesophageal reflux disease)    H/O blood clots    arm and leg   HBP (high blood pressure)    Hematuria    gross   Hemorrhoids    HLD (hyperlipidemia)    Muscle pain    Osteoarthritis    Osteoporosis    Panic disorder    Personal history of radiation therapy    PND (post-nasal drip)    CHRONIC WITH SORE THROAT AND SNEEZING   Reflux    Skin cancer    nose   Swelling    Tremor    Tremors of nervous system     Past Surgical History:  Procedure Laterality Date   APPENDECTOMY  1962   BREAST BIOPSY Right 2006?   benign   BREAST BIOPSY Left 2019   invasive mammary carcinoma   BREAST CYST EXCISION Left 07/27/2017   Procedure: SKIN CYST EXCISED;  Surgeon: Robert Bellow, MD;  Location: [facility] ORS;  Service: General;  Laterality: Left;   BREAST LUMPECTOMY Left 07/2017   invasive mammary, DCIS  mammosite   BREAST SURGERY Right 2019   CARPAL TUNNEL RELEASE Right    CATARACT EXTRACTION W/PHACO Left 11/11/2016   Procedure: CATARACT EXTRACTION PHACO AND INTRAOCULAR LENS PLACEMENT ([place]);  Surgeon: Birder Robson, MD;  Location: [facility] ORS;  Service: Ophthalmology;  Laterality: Left;  [place] 01:07.9 AP% 19.2 CDE 13.02 Fluid pack lot # [id number]   CATARACT EXTRACTION W/PHACO Right 12/09/2016   Procedure: CATARACT EXTRACTION PHACO AND INTRAOCULAR LENS PLACEMENT (IOC);  Surgeon: Birder Robson, MD;  Location: [facility] ORS;  Service: Ophthalmology;  Laterality: Right;  [place] 00:49 AP% 21.2 CDE 10.39 Fluid pack lot # [id number]   CHOLECYSTECTOMY     COLONOSCOPY WITH PROPOFOL N/A 11/22/2014   Procedure: COLONOSCOPY WITH PROPOFOL;  Surgeon: Manya Silvas, MD;  Location: [facility] ENDOSCOPY;  Service: Endoscopy;  Laterality: N/A;   CYSTOSCOPY WITH STENT PLACEMENT Bilateral 11/27/2018   Procedure: CYSTOSCOPY WITH STENT PLACEMENT;  Surgeon: Festus Aloe, MD;  Location: [facility] ORS;  Service: Urology;  Laterality: Bilateral;   CYSTOSCOPY/URETEROSCOPY/HOLMIUM LASER/STENT PLACEMENT Bilateral 12/17/2018    Procedure: CYSTOSCOPY/URETEROSCOPY/HOLMIUM LASER/STENT Exchange;  Surgeon: Billey Co, MD;  Location: [facility] ORS;  Service: Urology;  Laterality: Bilateral;   ESOPHAGOGASTRODUODENOSCOPY  11/22/2014   Procedure: ESOPHAGOGASTRODUODENOSCOPY (EGD);  Surgeon: Manya Silvas, MD;  Location: [facility] ENDOSCOPY;  Service: Endoscopy;;   ESOPHAGOGASTRODUODENOSCOPY (EGD) WITH PROPOFOL N/A 03/05/2017   Procedure: ESOPHAGOGASTRODUODENOSCOPY (EGD) WITH PROPOFOL;  Surgeon: Lin Landsman, MD;  Location: [facility] ENDOSCOPY;  Service: Gastroenterology;  Laterality: N/A;   EXTRACORPOREAL SHOCK WAVE LITHOTRIPSY Right 03/04/2018   Procedure: EXTRACORPOREAL SHOCK WAVE LITHOTRIPSY (ESWL);  Surgeon: Billey Co, MD;  Location: [facility] ORS;  Service: Urology;  Laterality: Right;   EYE SURGERY Bilateral 2012   cataract extraction with iol   GALLBLADDER SURGERY  1968   hemi pelvectomy     left side at age 4   HEMIPELVIC Left [place] Left    AMPUTATION d/t cancer at 87 years old   MASTECTOMY, PARTIAL Left 07/27/2017   Procedure: MASTECTOMY PARTIAL;  Surgeon: Robert Bellow, MD;  Location: [facility] ORS;  Service: General;  Laterality: Left;   MOUTH SURGERY  2019   teeth pulled with a bridge insertion.   fell out 12/16/18   OVARY SURGERY Right    cyst removed   SAVORY DILATION  11/22/2014   Procedure: SAVORY DILATION;  Surgeon: Manya Silvas, MD;  Location: [facility] ENDOSCOPY;  Service: Endoscopy;;   SENTINEL NODE BIOPSY Left 07/27/2017   Procedure: SENTINEL NODE BIOPSY;  Surgeon: Robert Bellow, MD;  Location: [facility] ORS;  Service: General;  Laterality: Left;   SKIN CANCER EXCISION     nose and arm and face   TEE WITHOUT CARDIOVERSION N/A 07/25/2019   Procedure: TRANSESOPHAGEAL ECHOCARDIOGRAM (TEE);  Surgeon: Corey Skains, MD;  Location: [facility] ORS;  Service: Cardiovascular;  Laterality: N/A;   TONSILLECTOMY      Social History Social History   Tobacco Use   Smoking status: Never   Smokeless  tobacco: Never  Vaping Use   Vaping Use: Never used  Substance Use Topics   Alcohol use: No   Drug use: No    Family History Family History  Problem Relation Age of Onset   Breast cancer Paternal Aunt    Ovarian cancer Sister    Prostate cancer Father    Stroke Mother    Kidney cancer Neg Hx    Bladder Cancer Neg Hx     Allergies  Allergen Reactions   Ciprofloxacin Hives   Codeine Other (See Comments)    HALLUCINATIONS   Tape     Rash and skin irritation/ paper tape and tegaderm OK   Ciprocinonide [Fluocinolone] Other (See Comments)    unknown   Amoxicillin Other (See Comments)  Upset stomach Has patient had a PCN reaction causing immediate rash, facial/tongue/throat swelling, SOB or lightheadedness with hypotension: No Has patient had a PCN reaction causing severe rash involving mucus membranes or skin necrosis: No Has patient had a PCN reaction that required hospitalization: No Has patient had a PCN reaction occurring within the last 10 years: Yes If all of the above answers are "NO", then may proceed with Cephalosporin use.;   Cefuroxime Other (See Comments)    OK INTRACAMERALLY PER DR WLP, upset stomach   Lipitor [Atorvastatin] Other (See Comments)    unknown     REVIEW OF SYSTEMS (Negative unless checked)  Constitutional: [] Weight loss  [] Fever  [] Chills Cardiac: [] Chest pain   [] Chest pressure   [] Palpitations   [] Shortness of breath when laying flat   [] Shortness of breath with exertion. Vascular:  [] Pain in legs with walking   [] Pain in legs at rest  [] History of DVT   [] Phlebitis   [] Swelling in legs   [] Varicose veins   [] Non-healing ulcers Pulmonary:   [] Uses home oxygen   [] Productive cough   [] Hemoptysis   [] Wheeze  [] COPD   [] Asthma Neurologic:  [] Dizziness   [] Seizures   [] History of stroke   [] History of TIA  [] Aphasia   [] Vissual changes   [] Weakness or numbness in arm   [] Weakness or numbness in leg Musculoskeletal:   [] Joint swelling   [] Joint  pain   [] Low back pain Hematologic:  [] Easy bruising  [] Easy bleeding   [] Hypercoagulable state   [] Anemic Gastrointestinal:  [] Diarrhea   [] Vomiting  [] Gastroesophageal reflux/heartburn   [] Difficulty swallowing. Genitourinary:  [] Chronic kidney disease   [] Difficult urination  [] Frequent urination   [] Blood in urine Skin:  [] Rashes   [] Ulcers  Psychological:  [] History of anxiety   []  History of major depression.  Physical Examination  There were no vitals filed for this visit. There is no height or weight on file to calculate BMI. Gen: WD/WN, NAD Seen in a wheelchair Head: [place]/AT, No temporalis wasting.  Ear/Nose/Throat: Hearing grossly intact, nares w/o erythema or drainage Eyes: PER, EOMI, sclera nonicteric.  Neck: Supple, no masses.  No bruit or JVD.  Pulmonary:  Good air movement, no audible wheezing, no use of accessory muscles.  Cardiac: RRR, normal S1, S2, no Murmurs. Vascular:   Vessel Right Left  Radial Palpable Palpable  PT Palpable AKA  DP Palpable AKA  Gastrointestinal: soft, non-distended. No guarding/no peritoneal signs.  Musculoskeletal: M/S 5/5 throughout.  No visible deformity.  Neurologic: CN 2-12 intact. Pain and light touch intact in extremities.  Symmetrical.  Speech is fluent. Motor exam as listed above. Psychiatric: Judgment intact, Mood & affect appropriate for pt's clinical situation. Dermatologic: No rashes or ulcers noted.  No changes consistent with cellulitis.   CBC Lab Results  Component Value Date   WBC 7.2 01/22/2021   HGB 11.2 (L) 01/22/2021   HCT 39.2 01/22/2021   MCV 80.7 01/22/2021   PLT 221 01/22/2021    BMET    Component Value Date/Time   NA 136 01/22/2021 1014   NA 139 07/01/2013 1721   K 4.1 01/22/2021 1014   K 4.6 07/01/2013 1721   CL 105 01/22/2021 1014   CL 108 (H) 07/01/2013 1721   CO2 24 01/22/2021 1014   CO2 23 07/01/2013 1721   GLUCOSE 262 (H) 01/22/2021 1014   GLUCOSE 150 (H) 07/01/2013 1721   BUN 20 01/22/2021  1014   BUN 23 01/20/2017 1134   BUN 21 (H)  07/01/2013 1721   CREATININE 0.50 01/22/2021 1014   CREATININE 0.95 07/01/2013 1721   CALCIUM 10.6 (H) 01/22/2021 1014   CALCIUM 11.2 (H) 07/01/2013 1721   GFRNONAA >60 01/22/2021 1014   GFRNONAA 58 (L) 07/01/2013 1721   GFRAA >60 01/24/2020 1023   GFRAA >60 07/01/2013 1721   Estimated Creatinine Clearance: 45 mL/min (by C-G formula based on SCr of 0.5 mg/dL).  COAG Lab Results  Component Value Date   INR 1.3 (H) 07/24/2019    Radiology CT HEAD WO CONTRAST (5MM)  Result Date: 01/10/2021 CLINICAL DATA:  Head trauma, mod-severe Fall, weakness. EXAM: CT HEAD WITHOUT CONTRAST TECHNIQUE: Contiguous axial images were obtained from the base of the skull through the vertex without intravenous contrast. COMPARISON:  None. FINDINGS: Brain: No intracranial hemorrhage, mass effect, or midline shift. Normal for age atrophy. No hydrocephalus. The basilar cisterns are patent. Mild periventricular chronic small vessel ischemia. No evidence of territorial infarct or acute ischemia. No extra-axial or intracranial fluid collection. Vascular: Atherosclerosis of skullbase vasculature without hyperdense vessel or abnormal calcification. Skull: No fracture or focal lesion. Sinuses/Orbits: Paranasal sinuses and mastoid air cells are clear. The visualized orbits are unremarkable. Bilateral cataract resection. Other: None. IMPRESSION: 1. No acute intracranial abnormality. No skull fracture. 2. Age atrophy and chronic small vessel ischemia. Electronically Signed   By: Keith Rake M.D.   On: 01/10/2021 22:17   CT CERVICAL SPINE WO CONTRAST  Result Date: 01/10/2021 CLINICAL DATA:  Head trauma, mod-severe EXAM: CT CERVICAL SPINE WITHOUT CONTRAST TECHNIQUE: Multidetector CT imaging of the cervical spine was performed without intravenous contrast. Multiplanar CT image reconstructions were also generated. COMPARISON:  None. FINDINGS: Alignment: Broad-based reversal of normal  lordosis. Trace anterolisthesis of C3 on C4. No traumatic subluxation. Skull base and vertebrae: No acute fracture. Vertebral body heights are maintained. The dens and skull base are intact. Soft tissues and spinal canal: No prevertebral fluid or swelling. No visible canal hematoma. Disc levels: Diffuse degenerative disc disease most prominent at C5-C6 and C6-C7 where there are posterior osteophytes. Multilevel facet hypertrophy. Upper chest: No acute or unexpected findings. Other: None. IMPRESSION: 1. No acute fracture or subluxation of the cervical spine. 2. Broad-based reversal of normal lordosis may be due to positioning or muscle spasm. 3. Multilevel degenerative disc disease and facet hypertrophy. Electronically Signed   By: Keith Rake M.D.   On: 01/10/2021 22:21   DG Knee Complete 4 Views Right  Result Date: 01/10/2021 CLINICAL DATA:  Injury.  Fall from couch this morning. EXAM: RIGHT KNEE - COMPLETE 4+ VIEW COMPARISON:  None. FINDINGS: No acute fracture or dislocation. Moderate medial tibiofemoral joint space narrowing. Moderate tricompartmental peripheral spurring. There are ossified intra-articular bodies. Small knee joint effusion. There is medial soft tissue edema. IMPRESSION: 1. No fracture or dislocation of the right knee. 2. Moderate tricompartmental osteoarthritis with ossified intra-articular bodies. Electronically Signed   By: Keith Rake M.D.   On: 01/10/2021 21:30     Assessment/Plan 1. PAD (peripheral artery disease) (HCC) Recommend:    No surgery or intervention at this point in time.  IVC filter is not indicated at present.   Patient's duplex ultrasound of the venous system shows DVT from the popliteal to the femoral veins.   The patient is initiated on anticoagulation    Elevation was stressed, use of a recliner was discussed.   The patient will wear graduated compression wraps. The patient will  beginning wearing the stockings first thing in the morning and  removing  them in the evening. The patient is instructed specifically not to sleep in the stockings.  In addition, behavioral modification including elevation during the day and avoidance of prolonged dependency will be initiated.     The patient will continue anticoagulation for now as there have not been any problems or complications at this point. - VAS [place] ABI WITH/WO TBI; Future  2. Primary hypertension Continue antihypertensive medications as already ordered, these medications have been reviewed and there are no changes at this time.   3. Coagulation disorder ([place]) Patient is now on Pradaxa  She will be on lifelong anticoagulation  4. Controlled type 2 diabetes mellitus without complication, without long-term current use of insulin (HCC) Continue hypoglycemic medications as already ordered, these medications have been reviewed and there are no changes at this time.  Hgb A1C to be monitored as already arranged by primary service     Hortencia Pilar, MD  01/24/2021 1:08 PM

## 2021-01-26 ENCOUNTER — Encounter (INDEPENDENT_AMBULATORY_CARE_PROVIDER_SITE_OTHER): Payer: Self-pay | Admitting: Vascular Surgery

## 2021-02-06 ENCOUNTER — Ambulatory Visit: Admission: RE | Admit: 2021-02-06 | Payer: Medicare Other | Source: Home / Self Care | Admitting: Internal Medicine

## 2021-02-06 ENCOUNTER — Encounter: Admission: RE | Payer: Self-pay | Source: Home / Self Care

## 2021-02-06 SURGERY — COLONOSCOPY WITH PROPOFOL
Anesthesia: General

## 2021-02-21 DIAGNOSIS — K219 Gastro-esophageal reflux disease without esophagitis: Secondary | ICD-10-CM

## 2021-02-21 DIAGNOSIS — E1159 Type 2 diabetes mellitus with other circulatory complications: Secondary | ICD-10-CM | POA: Diagnosis not present

## 2021-02-21 DIAGNOSIS — F3341 Major depressive disorder, recurrent, in partial remission: Secondary | ICD-10-CM | POA: Diagnosis not present

## 2021-02-21 DIAGNOSIS — Z89612 Acquired absence of left leg above knee: Secondary | ICD-10-CM | POA: Diagnosis not present

## 2021-02-21 DIAGNOSIS — I10 Essential (primary) hypertension: Secondary | ICD-10-CM | POA: Diagnosis not present

## 2021-03-11 DIAGNOSIS — R059 Cough, unspecified: Secondary | ICD-10-CM | POA: Diagnosis not present

## 2021-04-03 ENCOUNTER — Telehealth: Payer: Self-pay | Admitting: *Deleted

## 2021-04-03 DIAGNOSIS — K219 Gastro-esophageal reflux disease without esophagitis: Secondary | ICD-10-CM

## 2021-04-03 DIAGNOSIS — E1159 Type 2 diabetes mellitus with other circulatory complications: Secondary | ICD-10-CM

## 2021-04-03 DIAGNOSIS — M810 Age-related osteoporosis without current pathological fracture: Secondary | ICD-10-CM

## 2021-04-03 DIAGNOSIS — D735 Infarction of spleen: Secondary | ICD-10-CM

## 2021-04-03 DIAGNOSIS — Z853 Personal history of malignant neoplasm of breast: Secondary | ICD-10-CM

## 2021-04-03 DIAGNOSIS — I10 Essential (primary) hypertension: Secondary | ICD-10-CM

## 2021-04-03 DIAGNOSIS — F339 Major depressive disorder, recurrent, unspecified: Secondary | ICD-10-CM

## 2021-04-03 DIAGNOSIS — Z89612 Acquired absence of left leg above knee: Secondary | ICD-10-CM

## 2021-04-03 NOTE — Telephone Encounter (Signed)
Call from Nurse Robin at [facility] stating pharmacy went over patient medications and was asking how long patient will be on Letrozole and also asking about her Evista. I informed her per doctor note that patient is to be on Letrozole until May 2024. I also advised her that we do not and have not ordered the Evista (Raloxifene) She states she will contact patient PCP regarding that med

## 2021-04-11 ENCOUNTER — Encounter: Payer: Self-pay | Admitting: Oncology

## 2021-04-25 DIAGNOSIS — I10 Essential (primary) hypertension: Secondary | ICD-10-CM | POA: Diagnosis not present

## 2021-04-25 DIAGNOSIS — E1159 Type 2 diabetes mellitus with other circulatory complications: Secondary | ICD-10-CM | POA: Diagnosis not present

## 2021-04-25 DIAGNOSIS — Z89611 Acquired absence of right leg above knee: Secondary | ICD-10-CM | POA: Diagnosis not present

## 2021-04-25 DIAGNOSIS — C50919 Malignant neoplasm of unspecified site of unspecified female breast: Secondary | ICD-10-CM

## 2021-04-25 DIAGNOSIS — M159 Polyosteoarthritis, unspecified: Secondary | ICD-10-CM | POA: Diagnosis not present

## 2021-04-25 DIAGNOSIS — F3340 Major depressive disorder, recurrent, in remission, unspecified: Secondary | ICD-10-CM

## 2021-05-20 DIAGNOSIS — E1159 Type 2 diabetes mellitus with other circulatory complications: Secondary | ICD-10-CM | POA: Diagnosis not present

## 2021-05-20 DIAGNOSIS — M5441 Lumbago with sciatica, right side: Secondary | ICD-10-CM | POA: Diagnosis not present

## 2021-05-20 DIAGNOSIS — C50412 Malignant neoplasm of upper-outer quadrant of left female breast: Secondary | ICD-10-CM

## 2021-05-20 DIAGNOSIS — F3342 Major depressive disorder, recurrent, in full remission: Secondary | ICD-10-CM

## 2021-05-20 DIAGNOSIS — I10 Essential (primary) hypertension: Secondary | ICD-10-CM | POA: Diagnosis not present

## 2021-05-20 DIAGNOSIS — Z89512 Acquired absence of left leg below knee: Secondary | ICD-10-CM | POA: Diagnosis not present

## 2021-05-22 DIAGNOSIS — B372 Candidiasis of skin and nail: Secondary | ICD-10-CM

## 2021-05-22 DIAGNOSIS — M546 Pain in thoracic spine: Secondary | ICD-10-CM

## 2021-05-28 DIAGNOSIS — T8789 Other complications of amputation stump: Secondary | ICD-10-CM | POA: Diagnosis not present

## 2021-07-17 DIAGNOSIS — M1990 Unspecified osteoarthritis, unspecified site: Secondary | ICD-10-CM

## 2021-07-17 DIAGNOSIS — D735 Infarction of spleen: Secondary | ICD-10-CM

## 2021-07-17 DIAGNOSIS — I10 Essential (primary) hypertension: Secondary | ICD-10-CM | POA: Diagnosis not present

## 2021-07-17 DIAGNOSIS — M545 Low back pain, unspecified: Secondary | ICD-10-CM | POA: Diagnosis not present

## 2021-07-17 DIAGNOSIS — Z853 Personal history of malignant neoplasm of breast: Secondary | ICD-10-CM

## 2021-07-17 DIAGNOSIS — F331 Major depressive disorder, recurrent, moderate: Secondary | ICD-10-CM

## 2021-07-17 DIAGNOSIS — E1159 Type 2 diabetes mellitus with other circulatory complications: Secondary | ICD-10-CM | POA: Diagnosis not present

## 2021-07-17 DIAGNOSIS — K219 Gastro-esophageal reflux disease without esophagitis: Secondary | ICD-10-CM | POA: Diagnosis not present

## 2021-07-17 DIAGNOSIS — M810 Age-related osteoporosis without current pathological fracture: Secondary | ICD-10-CM

## 2021-07-17 DIAGNOSIS — Z89611 Acquired absence of right leg above knee: Secondary | ICD-10-CM

## 2021-07-21 NOTE — Progress Notes (Deleted)
?[place]  ?[phone] [phone] ? ?ID: Julie Acosta OB: 1933-07-21  MR#: [id number]  GEX#:[id number] ? ?Patient Care Team: ?[place] Pink, MD as PCP - General (Family Medicine) ?Robert Bellow, MD (General Surgery) ?Lloyd Huger, MD as Consulting Physician (Oncology) ? 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  Oncotype DX score 1.  Homozygous factor V Leiden. ? ?INTERVAL HISTORY: Patient returns to clinic today for routine 102-month evaluation and continuation of Prolia.  She continues to feel well and is at her baseline.  She is tolerating Pradaxa and letrozole without significant side effects. She has no neurologic complaints.  She denies any recent fevers or illnesses.  She has a good appetite and denies weight loss.  She denies any chest pain, shortness of breath, cough, or hemoptysis.  She denies any nausea, vomiting, constipation, or diarrhea.  She has no urinary complaints.  Patient offers no specific complaints today. ? ?REVIEW OF SYSTEMS:   ?Review of Systems  ?Constitutional: Negative.  Negative for fever, malaise/fatigue and weight loss.  ?Respiratory: Negative.  Negative for cough and shortness of breath.   ?Cardiovascular: Negative.  Negative for chest pain and leg swelling.  ?Gastrointestinal: Negative.  Negative for abdominal pain.  ?Genitourinary:  Negative for dysuria and flank pain.  ?Musculoskeletal:  Negative for joint pain.  ?Skin: Negative.  Negative for rash.  ?Neurological: Negative.  Negative for dizziness, sensory change, weakness and headaches.  ?Psychiatric/Behavioral: Negative.  The patient is not nervous/anxious.   ? ?As per HPI. Otherwise, a complete review of systems is negative. ? ? ?PAST MEDICAL HISTORY: Reviewed and unchanged. ? ?PAST SURGICAL HISTORY: ?Past Surgical History:  ?Procedure Laterality Date  ? APPENDECTOMY  1962  ?  BREAST BIOPSY Right 2006?  ? benign  ? BREAST BIOPSY Left 2019  ? invasive mammary carcinoma  ? BREAST CYST EXCISION Left 07/27/2017  ? Procedure: SKIN CYST EXCISED;  Surgeon: Robert Bellow, MD;  Location: [facility] ORS;  Service: General;  Laterality: Left;  ? BREAST LUMPECTOMY Left 07/2017  ? invasive mammary, DCIS  mammosite  ? BREAST SURGERY Right 2019  ? CARPAL TUNNEL RELEASE Right   ? CATARACT EXTRACTION W/PHACO Left 11/11/2016  ? Procedure: CATARACT EXTRACTION PHACO AND INTRAOCULAR LENS PLACEMENT ([facility]);  Surgeon: Birder Robson, MD;  Location: [facility] ORS;  Service: Ophthalmology;  Laterality: Left;  [place] 01:07.9 ?AP% 19.2 ?CDE 13.02 ?Fluid pack lot # [id number]  ? CATARACT EXTRACTION W/PHACO Right 12/09/2016  ? Procedure: CATARACT EXTRACTION PHACO AND INTRAOCULAR LENS PLACEMENT ([facility]);  Surgeon: Birder Robson, MD;  Location: [facility] ORS;  Service: Ophthalmology;  Laterality: Right;  [place] 00:49 ?AP% 21.2 ?CDE 10.39 ?Fluid pack lot # [id number]  ? CHOLECYSTECTOMY    ? COLONOSCOPY WITH PROPOFOL N/A 11/22/2014  ? Procedure: COLONOSCOPY WITH PROPOFOL;  Surgeon: Manya Silvas, MD;  Location: [facility] ENDOSCOPY;  Service: Endoscopy;  Laterality: N/A;  ? CYSTOSCOPY WITH STENT PLACEMENT Bilateral 11/27/2018  ? Procedure: CYSTOSCOPY WITH STENT PLACEMENT;  Surgeon: Festus Aloe, MD;  Location: [facility] ORS;  Service: Urology;  Laterality: Bilateral;  ? CYSTOSCOPY/URETEROSCOPY/HOLMIUM LASER/STENT PLACEMENT Bilateral 12/17/2018  ? Procedure: CYSTOSCOPY/URETEROSCOPY/HOLMIUM LASER/STENT Exchange;  Surgeon: Billey Co, MD;  Location: [facility] ORS;  Service: Urology;  Laterality: Bilateral;  ? ESOPHAGOGASTRODUODENOSCOPY  11/22/2014  ? Procedure: ESOPHAGOGASTRODUODENOSCOPY (EGD);  Surgeon: Manya Silvas, MD;  Location: [facility] ENDOSCOPY;  Service: Endoscopy;;  ? ESOPHAGOGASTRODUODENOSCOPY (EGD) WITH PROPOFOL N/A 03/05/2017  ? Procedure: ESOPHAGOGASTRODUODENOSCOPY (  EGD) WITH PROPOFOL;  Surgeon: Lin Landsman, MD;  Location: [facility]  ENDOSCOPY;  Service: Gastroenterology;  Laterality: N/A;  ? EXTRACORPOREAL SHOCK WAVE LITHOTRIPSY Right 03/04/2018  ? Procedure: EXTRACORPOREAL SHOCK WAVE LITHOTRIPSY (ESWL);  Surgeon: Billey Co, MD;  Location: [facility] ORS;  Service: Urology;  Laterality: Right;  ? EYE SURGERY Bilateral 2012  ? cataract extraction with iol  ? [place]  ? hemi pelvectomy    ? left side at age 80  ? HEMIPELVIC Left 1962  ? LEG SURGERY Left   ? AMPUTATION d/t cancer at 88 years old  ? MASTECTOMY, PARTIAL Left 07/27/2017  ? Procedure: MASTECTOMY PARTIAL;  Surgeon: Robert Bellow, MD;  Location: [facility] ORS;  Service: General;  Laterality: Left;  ? MOUTH SURGERY  2019  ? teeth pulled with a bridge insertion.   fell out 12/16/18  ? OVARY SURGERY Right   ? cyst removed  ? SAVORY DILATION  11/22/2014  ? Procedure: SAVORY DILATION;  Surgeon: Manya Silvas, MD;  Location: [facility] ENDOSCOPY;  Service: Endoscopy;;  ? SENTINEL NODE BIOPSY Left 07/27/2017  ? Procedure: SENTINEL NODE BIOPSY;  Surgeon: Robert Bellow, MD;  Location: [facility] ORS;  Service: General;  Laterality: Left;  ? SKIN CANCER EXCISION    ? nose and arm and face  ? TEE WITHOUT CARDIOVERSION N/A 07/25/2019  ? Procedure: TRANSESOPHAGEAL ECHOCARDIOGRAM (TEE);  Surgeon: Corey Skains, MD;  Location: [facility] ORS;  Service: Cardiovascular;  Laterality: N/A;  ? TONSILLECTOMY    ? ? ?FAMILY HISTORY: ?Family History  ?Problem Relation Age of Onset  ? Breast cancer Paternal Aunt   ? Ovarian cancer Sister   ? Prostate cancer Father   ? Stroke Mother   ? Kidney cancer Neg Hx   ? Bladder Cancer Neg Hx   ? ? ?ADVANCED DIRECTIVES (Y/N):  N ? ?HEALTH MAINTENANCE: ?Social History  ? ?Tobacco Use  ? Smoking status: Never  ? Smokeless tobacco: Never  ?Vaping Use  ? Vaping Use: Never used  ?Substance Use Topics  ? Alcohol use: No  ? Drug use: No  ? ? ? Colonoscopy: ? PAP: ? Bone density: ? Lipid panel: ? ?Allergies  ?Allergen Reactions  ? Ciprofloxacin Hives  ? Codeine Other  (See Comments)  ?  HALLUCINATIONS  ? Tape   ?  Rash and skin irritation/ paper tape and tegaderm OK  ? Ciprocinonide [Fluocinolone] Other (See Comments)  ?  unknown  ? Amoxicillin Other (See Comments)  ?  Upset stomach ?Has patient had a PCN reaction causing immediate rash, facial/tongue/throat swelling, SOB or lightheadedness with hypotension: No ?Has patient had a PCN reaction causing severe rash involving mucus membranes or skin necrosis: No ?Has patient had a PCN reaction that required hospitalization: No ?Has patient had a PCN reaction occurring within the last 10 years: Yes ?If all of the above answers are "NO", then may proceed with Cephalosporin use.;  ? Cefuroxime Other (See Comments)  ?  OK INTRACAMERALLY PER DR WLP, upset stomach  ? Lipitor [Atorvastatin] Other (See Comments)  ?  unknown  ? ? ?Current Outpatient Medications  ?Medication Sig Dispense Refill  ? acetaminophen (TYLENOL) 500 MG tablet Take 500 mg by mouth every 6 (six) hours as needed.    ? acetaminophen (TYLENOL) 650 MG CR tablet Take 650 mg by mouth every 8 (eight) hours as needed for pain.    ? baclofen (LIORESAL) 10 MG tablet as needed. (Patient not taking: No sig reported)    ?  Biotin 10 MG TABS Take by mouth.    ? Blood Glucose Monitoring Suppl (FIFTY50 GLUCOSE METER 2.0) w/Device KIT See admin instructions. (Patient not taking: No sig reported)    ? Cholecalciferol 25 MCG (1000 UT) tablet Take by mouth.    ? Dentifrices (BIOTENE DRY MOUTH DT) by Transmucosal route.    ? diclofenac Sodium (VOLTAREN) 1 % GEL Apply topically 4 (four) times daily.    ? fenofibrate 160 MG tablet TAKE ONE TABLET BY MOUTH EVERY DAY (Patient not taking: No sig reported)    ? GLIPIZIDE XL 2.5 MG 24 hr tablet Take 2.5 mg by mouth daily.     ? letrozole (FEMARA) 2.5 MG tablet TAKE ONE TABLET BY MOUTH EVERY DAY 90 tablet 3  ? loratadine (CLARITIN) 10 MG tablet Take 10 mg by mouth daily.  (Patient not taking: No sig reported)    ? losartan (COZAAR) 50 MG tablet Take  50 mg by mouth daily.    ? metFORMIN (GLUCOPHAGE) 500 MG tablet Take 1,000 mg by mouth 2 (two) times daily.     ? metoprolol succinate (TOPROL-XL) 25 MG 24 hr tablet Take 12.5 mg by mouth at bedtime.     ? [place]

## 2021-07-23 ENCOUNTER — Inpatient Hospital Stay: Payer: Medicare Other

## 2021-07-23 ENCOUNTER — Inpatient Hospital Stay: Payer: Medicare Other | Admitting: Oncology

## 2021-07-23 ENCOUNTER — Other Ambulatory Visit: Payer: Self-pay

## 2021-07-23 ENCOUNTER — Inpatient Hospital Stay: Payer: Medicare Other | Attending: Oncology | Admitting: Oncology

## 2021-07-23 VITALS — BP 115/37 | HR 62 | Temp 98.2°F | Resp 18

## 2021-07-23 DIAGNOSIS — C50412 Malignant neoplasm of upper-outer quadrant of left female breast: Secondary | ICD-10-CM | POA: Insufficient documentation

## 2021-07-23 DIAGNOSIS — M810 Age-related osteoporosis without current pathological fracture: Secondary | ICD-10-CM | POA: Diagnosis not present

## 2021-07-23 DIAGNOSIS — D6851 Activated protein C resistance: Secondary | ICD-10-CM | POA: Diagnosis not present

## 2021-07-23 DIAGNOSIS — Z170 Estrogen receptor positive status [ER+]: Secondary | ICD-10-CM | POA: Diagnosis not present

## 2021-07-23 LAB — BASIC METABOLIC PANEL
Anion gap: 7 (ref 5–15)
BUN: 23 mg/dL (ref 8–23)
CO2: 26 mmol/L (ref 22–32)
Calcium: 10.8 mg/dL — ABNORMAL HIGH (ref 8.9–10.3)
Chloride: 104 mmol/L (ref 98–111)
Creatinine, Ser: 0.74 mg/dL (ref 0.44–1.00)
GFR, Estimated: >60 mL/min (ref >60)
Glucose, Bld: 304 mg/dL — ABNORMAL HIGH (ref 70–99)
Potassium: 4.9 mmol/L (ref 3.5–5.1)
Sodium: 137 mmol/L (ref 135–145)

## 2021-07-23 LAB — CBC WITH DIFFERENTIAL/PLATELET
Abs Immature Granulocytes: 0.04 10*3/uL (ref 0.00–0.07)
Basophils Absolute: 0.1 10*3/uL (ref 0.0–0.1)
Basophils Relative: 1 %
Eosinophils Absolute: 0.2 10*3/uL (ref 0.0–0.5)
Eosinophils Relative: 2 %
HCT: 39.7 % (ref 36.0–46.0)
Hemoglobin: 11.6 g/dL — ABNORMAL LOW (ref 12.0–15.0)
Immature Granulocytes: 0 %
Lymphocytes Relative: 24 %
Lymphs Abs: 2.1 10*3/uL (ref 0.7–4.0)
MCH: 24.1 pg — ABNORMAL LOW (ref 26.0–34.0)
MCHC: 29.2 g/dL — ABNORMAL LOW (ref 30.0–36.0)
MCV: 82.5 fL (ref 80.0–100.0)
Monocytes Absolute: 0.6 10*3/uL (ref 0.1–1.0)
Monocytes Relative: 7 %
Neutro Abs: 6 10*3/uL (ref 1.7–7.7)
Neutrophils Relative %: 66 %
Platelets: 267 10*3/uL (ref 150–400)
RBC: 4.81 MIL/uL (ref 3.87–5.11)
RDW: 19.4 % — ABNORMAL HIGH (ref 11.5–15.5)
WBC: 8.9 10*3/uL (ref 4.0–10.5)
nRBC: 0 % (ref 0.0–0.2)

## 2021-07-23 MED ORDER — DENOSUMAB 60 MG/ML ~~LOC~~ SOSY
60.0000 mg | PREFILLED_SYRINGE | Freq: Once | SUBCUTANEOUS | Status: AC
  Administered 2021-07-23: 60 mg via SUBCUTANEOUS

## 2021-07-23 NOTE — Progress Notes (Signed)
?[place]  ?[phone] [phone] ? ?ID: Dianah Field OB: January 10, 1934  MR#: [id number]  KXF#:[id number] ? ?Patient Care Team: ?[place] Pink, MD as PCP - General (Family Medicine) ?Robert Bellow, MD (General Surgery) ?Lloyd Huger, MD as Consulting Physician (Oncology) ? 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  Oncotype DX score 1.  Homozygous factor V Leiden. ? ?INTERVAL HISTORY: Patient returns to clinic today for routine 64-month evaluation and continuation of Prolia. She had a fall approximately 5 months ago and still has difficulty with transfers and ambulation.  She otherwise feels well.  She continues to tolerate Pradaxa and letrozole without significant side effects. She has no neurologic complaints.  She denies any recent fevers or illnesses.  She has a good appetite and denies weight loss.  She denies any chest pain, shortness of breath, cough, or hemoptysis.  She denies any nausea, vomiting, constipation, or diarrhea.  She has no urinary complaints.  Patient offers no further specific complaints today. ? ?REVIEW OF SYSTEMS:   ?Review of Systems  ?Constitutional: Negative.  Negative for fever, malaise/fatigue and weight loss.  ?Respiratory: Negative.  Negative for cough and shortness of breath.   ?Cardiovascular: Negative.  Negative for chest pain and leg swelling.  ?Gastrointestinal: Negative.  Negative for abdominal pain.  ?Genitourinary:  Negative for dysuria and flank pain.  ?Musculoskeletal:  Positive for falls. Negative for joint pain.  ?Skin: Negative.  Negative for rash.  ?Neurological: Negative.  Negative for dizziness, sensory change, weakness and headaches.  ?Psychiatric/Behavioral: Negative.  The patient is not nervous/anxious.   ? ?As per HPI. Otherwise, a complete review of systems is negative. ? ? ?PAST MEDICAL HISTORY: Reviewed and  unchanged. ? ?PAST SURGICAL HISTORY: ?Past Surgical History:  ?Procedure Laterality Date  ? APPENDECTOMY  1962  ? BREAST BIOPSY Right 2006?  ? benign  ? BREAST BIOPSY Left 2019  ? invasive mammary carcinoma  ? BREAST CYST EXCISION Left 07/27/2017  ? Procedure: SKIN CYST EXCISED;  Surgeon: Robert Bellow, MD;  Location: [facility];  Service: General;  Laterality: Left;  ? BREAST LUMPECTOMY Left 07/2017  ? invasive mammary, DCIS  mammosite  ? BREAST SURGERY Right 2019  ? CARPAL TUNNEL RELEASE Right   ? CATARACT EXTRACTION W/PHACO Left 11/11/2016  ? Procedure: CATARACT EXTRACTION PHACO AND INTRAOCULAR LENS PLACEMENT (IOC);  Surgeon: Birder Robson, MD;  Location: [facility];  Service: Ophthalmology;  Laterality: Left;  [place] 01:07.9 ?AP% 19.2 ?CDE 13.02 ?Fluid pack lot # [id number]  ? CATARACT EXTRACTION W/PHACO Right 12/09/2016  ? Procedure: CATARACT EXTRACTION PHACO AND INTRAOCULAR LENS PLACEMENT (IOC);  Surgeon: Birder Robson, MD;  Location: [facility];  Service: Ophthalmology;  Laterality: Right;  [place] 00:49 ?AP% 21.2 ?CDE 10.39 ?Fluid pack lot # [id number]  ? CHOLECYSTECTOMY    ? COLONOSCOPY WITH PROPOFOL N/A 11/22/2014  ? Procedure: COLONOSCOPY WITH PROPOFOL;  Surgeon: Manya Silvas, MD;  Location: [facility];  Service: Endoscopy;  Laterality: N/A;  ? CYSTOSCOPY WITH STENT PLACEMENT Bilateral 11/27/2018  ? Procedure: CYSTOSCOPY WITH STENT PLACEMENT;  Surgeon: Festus Aloe, MD;  Location: [facility];  Service: Urology;  Laterality: Bilateral;  ? CYSTOSCOPY/URETEROSCOPY/HOLMIUM LASER/STENT PLACEMENT Bilateral 12/17/2018  ? Procedure: CYSTOSCOPY/URETEROSCOPY/HOLMIUM LASER/STENT Exchange;  Surgeon: Billey Co, MD;  Location: [facility];  Service: Urology;  Laterality: Bilateral;  ? ESOPHAGOGASTRODUODENOSCOPY  11/22/2014  ? Procedure: ESOPHAGOGASTRODUODENOSCOPY (EGD);  Surgeon: Manya Silvas, MD;  Location: [facility];  Service: Endoscopy;;  ? ESOPHAGOGASTRODUODENOSCOPY (EGD) WITH PROPOFOL N/A 03/05/2017  ?  Procedure: ESOPHAGOGASTRODUODENOSCOPY (EGD) WITH PROPOFOL;  Surgeon: Lin Landsman, MD;  Location[facility];  Service: Gastroenterology;  Laterality: N/A;  ? EXTRACORPOREAL SHOCK WAVE LITHOTRIPSY Right 03/04/2018  ? Procedure: EXTRACORPOREAL SHOCK WAVE LITHOTRIPSY (ESWL);  Surgeon: Billey Co, MD;  Location: [facility];  Service: Urology;  Laterality: Right;  ? EYE SURGERY Bilateral 2012  ? cataract extraction with iol  ? [place]  ? hemi pelvectomy    ? left side at age 60  ? HEMIPELVIC Left 1962  ? LEG SURGERY Left   ? AMPUTATION d/t cancer at 88 years old  ? MASTECTOMY, PARTIAL Left 07/27/2017  ? Procedure: MASTECTOMY PARTIAL;  Surgeon: Robert Bellow, MD;  Location: [facility];  Service: General;  Laterality: Left;  ? MOUTH SURGERY  2019  ? teeth pulled with a bridge insertion.   fell out 12/16/18  ? OVARY SURGERY Right   ? cyst removed  ? SAVORY DILATION  11/22/2014  ? Procedure: SAVORY DILATION;  Surgeon: Manya Silvas, MD;  Location: [facility] END[facility]COPY;  Service: Endoscopy;;  ? SENTINEL NODE BIOPSY Left 07/27/2017  ? Procedure: SENTINEL NODE BIOPSY;  Surgeon: Robert Bellow, MD;  Location: [facility];  Service: General;  Laterality: Left;  ? SKIN CANCER EXCISION    ? nose and arm and face  ? TEE WITHOUT CARDIOVERSION N/A 07/25/2019  ? Procedure: TRANSESOPHAGEAL ECHOCARDIOGRAM (TEE);  Surgeon: Corey Skains, MD;  Location: [facility];  Service: Cardiovascular;  Laterality: N/A;  ? TONSILLECTOMY    ? ? ?FAMILY HISTORY: ?Family History  ?Problem Relation Age of Onset  ? Breast cancer Paternal Aunt   ? Ovarian cancer Sister   ? Prostate cancer Father   ? Stroke Mother   ? Kidney cancer Neg Hx   ? Bladder Cancer Neg Hx   ? ? ?ADVANCED DIRECTIVES (Y/N):  N ? ?HEALTH MAINTENANCE: ?Social History  ? ?Tobacco Use  ? Smoking status: Never  ? Smokeless tobacco: Never  ?Vaping Use  ? Vaping Use: Never used  ?Substance Use Topics  ? Alcohol use: No  ? Drug use: No   ? ? ? Colonoscopy: ? PAP: ? Bone density: ? Lipid panel: ? ?Allergies  ?Allergen Reactions  ? Ciprofloxacin Hives  ? Codeine Other (See Comments)  ?  HALLUCINATIONS  ? Tape   ?  Rash and skin irritation/ paper tape and tegaderm OK  ? Ciprocinonide [Fluocinolone] Other (See Comments)  ?  unknown  ? Amoxicillin Other (See Comments)  ?  Upset stomach ?Has patient had a PCN reaction causing immediate rash, facial/tongue/throat swelling, SOB or lightheadedness with hypotension: No ?Has patient had a PCN reaction causing severe rash involving mucus membranes or skin necrosis: No ?Has patient had a PCN reaction that required hospitalization: No ?Has patient had a PCN reaction occurring within the last 10 years: Yes ?If all of the above answers are "NO", then may proceed with Cephalosporin use.;  ? Cefuroxime Other (See Comments)  ?  OK INTRACAMERALLY PER DR WLP, upset stomach  ? Latex Rash  ?  Rast test NEGATIVE  ? Lipitor [Atorvastatin] Other (See Comments)  ?  unknown  ? ? ?Current Outpatient Medications  ?Medication Sig Dispense Refill  ? acetaminophen (TYLENOL) 500 MG tablet Take 500 mg by mouth every 6 (six) hours as needed.    ? acetaminophen (TYLENOL) 650 MG CR tablet Take 650 mg by mouth every 8 (eight)  hours as needed for pain.    ? Biotin 10 MG TABS Take by mouth.    ? Cholecalciferol 25 MCG (1000 UT) tablet Take by mouth.    ? Dentifrices (BIOTENE DRY MOUTH DT) by Transmucosal route.    ? diclofenac Sodium (VOLTAREN) 1 % GEL Apply topically 4 (four) times daily.    ? GLIPIZIDE XL 2.5 MG 24 hr tablet Take 2.5 mg by mouth daily.     ? letrozole (FEMARA) 2.5 MG tablet TAKE ONE TABLET BY MOUTH EVERY DAY 90 tablet 3  ? losartan (COZAAR) 50 MG tablet Take 50 mg by mouth daily.    ? metFORMIN (GLUCOPHAGE) 500 MG tablet Take 1,000 mg by mouth 2 (two) times daily.     ? metoprolol succinate (TOPROL-XL) 25 MG 24 hr tablet Take 12.5 mg by mouth at bedtime.     ? Multiple Vitamin (MULTIVITAMIN WITH MINERALS) TABS tablet  Take 1 tablet by mouth daily.    ? nitrofurantoin, macrocrystal-monohydrate, (MACROBID) 100 MG capsule Take 100 mg by mouth 2 (two) times daily.    ? omeprazole (PRILOSEC) 40 MG capsule Take 40 mg by mouth daily.     ? PARoxetine (PAXI

## 2021-07-23 NOTE — Progress Notes (Signed)
Pt reports generalized soreness since falling in November 2022.  ?

## 2021-07-25 ENCOUNTER — Encounter: Payer: Self-pay | Admitting: Oncology

## 2021-07-26 DIAGNOSIS — B372 Candidiasis of skin and nail: Secondary | ICD-10-CM | POA: Diagnosis not present

## 2021-08-07 ENCOUNTER — Other Ambulatory Visit: Payer: Medicare Other

## 2021-08-07 ENCOUNTER — Ambulatory Visit: Payer: Medicare Other

## 2021-08-07 ENCOUNTER — Ambulatory Visit: Payer: Medicare Other | Admitting: Oncology

## 2021-09-19 DIAGNOSIS — I10 Essential (primary) hypertension: Secondary | ICD-10-CM | POA: Diagnosis not present

## 2021-09-19 DIAGNOSIS — E1159 Type 2 diabetes mellitus with other circulatory complications: Secondary | ICD-10-CM | POA: Diagnosis not present

## 2021-09-19 DIAGNOSIS — F3340 Major depressive disorder, recurrent, in remission, unspecified: Secondary | ICD-10-CM | POA: Diagnosis not present

## 2021-09-19 DIAGNOSIS — Z862 Personal history of diseases of the blood and blood-forming organs and certain disorders involving the immune mechanism: Secondary | ICD-10-CM

## 2021-09-19 DIAGNOSIS — C50919 Malignant neoplasm of unspecified site of unspecified female breast: Secondary | ICD-10-CM

## 2021-09-19 DIAGNOSIS — Z89612 Acquired absence of left leg above knee: Secondary | ICD-10-CM | POA: Diagnosis not present

## 2021-09-25 ENCOUNTER — Telehealth: Payer: Self-pay | Admitting: *Deleted

## 2021-09-25 NOTE — Telephone Encounter (Signed)
Call from [place] at [facility] stating that Dr Silvio Pate asked her to call due to the pharmacy wanting to change Pradaxa to Eliquis and Dr Silvio Pate asking if the splenic infarct profile is the same with Eliquis and Pradaxa. Please advise

## 2021-09-26 ENCOUNTER — Encounter: Payer: Self-pay | Admitting: Oncology

## 2021-09-26 NOTE — Telephone Encounter (Signed)
Call returned to [facility] and advised of doctor preference to stay on Pradaxa

## 2021-09-27 DIAGNOSIS — M25561 Pain in right knee: Secondary | ICD-10-CM | POA: Diagnosis not present

## 2021-10-21 ENCOUNTER — Ambulatory Visit
Admission: RE | Admit: 2021-10-21 | Discharge: 2021-10-21 | Disposition: A | Discharge status: Home / Self Care | Payer: Medicare Other | Source: Ambulatory Visit | Attending: Oncology | Admitting: Oncology

## 2021-10-21 DIAGNOSIS — Z13820 Encounter for screening for osteoporosis: Secondary | ICD-10-CM | POA: Insufficient documentation

## 2021-10-21 DIAGNOSIS — Z9221 Personal history of antineoplastic chemotherapy: Secondary | ICD-10-CM | POA: Diagnosis not present

## 2021-10-21 DIAGNOSIS — E119 Type 2 diabetes mellitus without complications: Secondary | ICD-10-CM | POA: Insufficient documentation

## 2021-10-21 DIAGNOSIS — C50412 Malignant neoplasm of upper-outer quadrant of left female breast: Secondary | ICD-10-CM | POA: Diagnosis not present

## 2021-10-21 DIAGNOSIS — Z89612 Acquired absence of left leg above knee: Secondary | ICD-10-CM | POA: Diagnosis not present

## 2021-10-21 DIAGNOSIS — M1990 Unspecified osteoarthritis, unspecified site: Secondary | ICD-10-CM | POA: Diagnosis not present

## 2021-10-21 DIAGNOSIS — Z853 Personal history of malignant neoplasm of breast: Secondary | ICD-10-CM | POA: Insufficient documentation

## 2021-10-21 DIAGNOSIS — Z780 Asymptomatic menopausal state: Secondary | ICD-10-CM | POA: Diagnosis not present

## 2021-10-21 DIAGNOSIS — Z7984 Long term (current) use of oral hypoglycemic drugs: Secondary | ICD-10-CM | POA: Diagnosis not present

## 2021-10-21 DIAGNOSIS — Z79811 Long term (current) use of aromatase inhibitors: Secondary | ICD-10-CM | POA: Diagnosis not present

## 2021-10-21 DIAGNOSIS — M810 Age-related osteoporosis without current pathological fracture: Secondary | ICD-10-CM | POA: Diagnosis present

## 2021-10-21 DIAGNOSIS — Z79899 Other long term (current) drug therapy: Secondary | ICD-10-CM | POA: Diagnosis not present

## 2021-10-21 DIAGNOSIS — M8589 Other specified disorders of bone density and structure, multiple sites: Secondary | ICD-10-CM | POA: Diagnosis not present

## 2021-10-21 DIAGNOSIS — Z923 Personal history of irradiation: Secondary | ICD-10-CM | POA: Diagnosis not present

## 2021-10-21 DIAGNOSIS — Z1231 Encounter for screening mammogram for malignant neoplasm of breast: Secondary | ICD-10-CM | POA: Insufficient documentation

## 2021-11-22 DIAGNOSIS — M810 Age-related osteoporosis without current pathological fracture: Secondary | ICD-10-CM | POA: Diagnosis not present

## 2021-11-22 DIAGNOSIS — K219 Gastro-esophageal reflux disease without esophagitis: Secondary | ICD-10-CM | POA: Diagnosis not present

## 2021-11-22 DIAGNOSIS — Z853 Personal history of malignant neoplasm of breast: Secondary | ICD-10-CM

## 2021-11-22 DIAGNOSIS — Z89621 Acquired absence of right hip joint: Secondary | ICD-10-CM

## 2021-11-22 DIAGNOSIS — F331 Major depressive disorder, recurrent, moderate: Secondary | ICD-10-CM

## 2021-11-22 DIAGNOSIS — E1159 Type 2 diabetes mellitus with other circulatory complications: Secondary | ICD-10-CM | POA: Diagnosis not present

## 2021-11-22 DIAGNOSIS — D735 Infarction of spleen: Secondary | ICD-10-CM

## 2021-11-22 DIAGNOSIS — I10 Essential (primary) hypertension: Secondary | ICD-10-CM | POA: Diagnosis not present

## 2021-12-15 ENCOUNTER — Emergency Department: Payer: Medicare Other

## 2021-12-15 ENCOUNTER — Inpatient Hospital Stay
Admission: EM | Admit: 2021-12-15 | Discharge: 2021-12-19 | DRG: 871 | Disposition: A | Discharge status: Skilled Nursing Facility | Payer: Medicare Other | Source: Skilled Nursing Facility | Attending: Obstetrics and Gynecology | Admitting: Obstetrics and Gynecology

## 2021-12-15 ENCOUNTER — Other Ambulatory Visit: Payer: Self-pay

## 2021-12-15 DIAGNOSIS — Z91040 Latex allergy status: Secondary | ICD-10-CM

## 2021-12-15 DIAGNOSIS — I10 Essential (primary) hypertension: Secondary | ICD-10-CM | POA: Diagnosis present

## 2021-12-15 DIAGNOSIS — Z9012 Acquired absence of left breast and nipple: Secondary | ICD-10-CM

## 2021-12-15 DIAGNOSIS — Z6834 Body mass index (BMI) 34.0-34.9, adult: Secondary | ICD-10-CM

## 2021-12-15 DIAGNOSIS — Z881 Allergy status to other antibiotic agents status: Secondary | ICD-10-CM

## 2021-12-15 DIAGNOSIS — Z66 Do not resuscitate: Secondary | ICD-10-CM | POA: Diagnosis present

## 2021-12-15 DIAGNOSIS — R6520 Severe sepsis without septic shock: Secondary | ICD-10-CM | POA: Diagnosis present

## 2021-12-15 DIAGNOSIS — F410 Panic disorder [episodic paroxysmal anxiety] without agoraphobia: Secondary | ICD-10-CM | POA: Diagnosis present

## 2021-12-15 DIAGNOSIS — M1990 Unspecified osteoarthritis, unspecified site: Secondary | ICD-10-CM | POA: Diagnosis present

## 2021-12-15 DIAGNOSIS — A4151 Sepsis due to Escherichia coli [E. coli]: Secondary | ICD-10-CM | POA: Diagnosis not present

## 2021-12-15 DIAGNOSIS — N179 Acute kidney failure, unspecified: Secondary | ICD-10-CM | POA: Diagnosis present

## 2021-12-15 DIAGNOSIS — E041 Nontoxic single thyroid nodule: Secondary | ICD-10-CM

## 2021-12-15 DIAGNOSIS — I21A1 Myocardial infarction type 2: Secondary | ICD-10-CM | POA: Diagnosis present

## 2021-12-15 DIAGNOSIS — J189 Pneumonia, unspecified organism: Secondary | ICD-10-CM | POA: Diagnosis present

## 2021-12-15 DIAGNOSIS — Z7901 Long term (current) use of anticoagulants: Secondary | ICD-10-CM

## 2021-12-15 DIAGNOSIS — K219 Gastro-esophageal reflux disease without esophagitis: Secondary | ICD-10-CM | POA: Diagnosis present

## 2021-12-15 DIAGNOSIS — Z7984 Long term (current) use of oral hypoglycemic drugs: Secondary | ICD-10-CM

## 2021-12-15 DIAGNOSIS — R410 Disorientation, unspecified: Secondary | ICD-10-CM | POA: Diagnosis not present

## 2021-12-15 DIAGNOSIS — Z85828 Personal history of other malignant neoplasm of skin: Secondary | ICD-10-CM

## 2021-12-15 DIAGNOSIS — I4891 Unspecified atrial fibrillation: Secondary | ICD-10-CM | POA: Diagnosis not present

## 2021-12-15 DIAGNOSIS — E876 Hypokalemia: Secondary | ICD-10-CM | POA: Diagnosis not present

## 2021-12-15 DIAGNOSIS — J9601 Acute respiratory failure with hypoxia: Secondary | ICD-10-CM | POA: Diagnosis present

## 2021-12-15 DIAGNOSIS — E669 Obesity, unspecified: Secondary | ICD-10-CM | POA: Diagnosis present

## 2021-12-15 DIAGNOSIS — A419 Sepsis, unspecified organism: Secondary | ICD-10-CM

## 2021-12-15 DIAGNOSIS — C50412 Malignant neoplasm of upper-outer quadrant of left female breast: Secondary | ICD-10-CM | POA: Diagnosis present

## 2021-12-15 DIAGNOSIS — I4892 Unspecified atrial flutter: Secondary | ICD-10-CM | POA: Diagnosis not present

## 2021-12-15 DIAGNOSIS — E1142 Type 2 diabetes mellitus with diabetic polyneuropathy: Secondary | ICD-10-CM

## 2021-12-15 DIAGNOSIS — Z20822 Contact with and (suspected) exposure to covid-19: Secondary | ICD-10-CM | POA: Diagnosis present

## 2021-12-15 DIAGNOSIS — Z888 Allergy status to other drugs, medicaments and biological substances status: Secondary | ICD-10-CM

## 2021-12-15 DIAGNOSIS — Z803 Family history of malignant neoplasm of breast: Secondary | ICD-10-CM

## 2021-12-15 DIAGNOSIS — Z79811 Long term (current) use of aromatase inhibitors: Secondary | ICD-10-CM

## 2021-12-15 DIAGNOSIS — Z880 Allergy status to penicillin: Secondary | ICD-10-CM

## 2021-12-15 DIAGNOSIS — E1151 Type 2 diabetes mellitus with diabetic peripheral angiopathy without gangrene: Secondary | ICD-10-CM | POA: Diagnosis present

## 2021-12-15 DIAGNOSIS — Z86718 Personal history of other venous thrombosis and embolism: Secondary | ICD-10-CM

## 2021-12-15 DIAGNOSIS — E119 Type 2 diabetes mellitus without complications: Secondary | ICD-10-CM | POA: Diagnosis not present

## 2021-12-15 DIAGNOSIS — N390 Urinary tract infection, site not specified: Secondary | ICD-10-CM | POA: Diagnosis present

## 2021-12-15 DIAGNOSIS — R0902 Hypoxemia: Secondary | ICD-10-CM | POA: Diagnosis not present

## 2021-12-15 DIAGNOSIS — Y95 Nosocomial condition: Secondary | ICD-10-CM | POA: Diagnosis present

## 2021-12-15 DIAGNOSIS — Z885 Allergy status to narcotic agent status: Secondary | ICD-10-CM

## 2021-12-15 DIAGNOSIS — I480 Paroxysmal atrial fibrillation: Secondary | ICD-10-CM | POA: Diagnosis not present

## 2021-12-15 DIAGNOSIS — E1169 Type 2 diabetes mellitus with other specified complication: Secondary | ICD-10-CM

## 2021-12-15 DIAGNOSIS — Z85831 Personal history of malignant neoplasm of soft tissue: Secondary | ICD-10-CM

## 2021-12-15 DIAGNOSIS — M810 Age-related osteoporosis without current pathological fracture: Secondary | ICD-10-CM | POA: Diagnosis present

## 2021-12-15 DIAGNOSIS — D6851 Activated protein C resistance: Secondary | ICD-10-CM | POA: Diagnosis present

## 2021-12-15 DIAGNOSIS — Z89622 Acquired absence of left hip joint: Secondary | ICD-10-CM

## 2021-12-15 DIAGNOSIS — F32A Depression, unspecified: Secondary | ICD-10-CM | POA: Diagnosis present

## 2021-12-15 DIAGNOSIS — E0590 Thyrotoxicosis, unspecified without thyrotoxic crisis or storm: Secondary | ICD-10-CM | POA: Diagnosis present

## 2021-12-15 DIAGNOSIS — E785 Hyperlipidemia, unspecified: Secondary | ICD-10-CM | POA: Diagnosis present

## 2021-12-15 DIAGNOSIS — Z7401 Bed confinement status: Secondary | ICD-10-CM

## 2021-12-15 DIAGNOSIS — Z923 Personal history of irradiation: Secondary | ICD-10-CM

## 2021-12-15 DIAGNOSIS — F339 Major depressive disorder, recurrent, unspecified: Secondary | ICD-10-CM | POA: Diagnosis present

## 2021-12-15 DIAGNOSIS — Z87442 Personal history of urinary calculi: Secondary | ICD-10-CM

## 2021-12-15 DIAGNOSIS — I739 Peripheral vascular disease, unspecified: Secondary | ICD-10-CM | POA: Diagnosis present

## 2021-12-15 DIAGNOSIS — I82409 Acute embolism and thrombosis of unspecified deep veins of unspecified lower extremity: Secondary | ICD-10-CM | POA: Diagnosis present

## 2021-12-15 DIAGNOSIS — Z85830 Personal history of malignant neoplasm of bone: Secondary | ICD-10-CM

## 2021-12-15 DIAGNOSIS — Z79899 Other long term (current) drug therapy: Secondary | ICD-10-CM

## 2021-12-15 DIAGNOSIS — Z91048 Other nonmedicinal substance allergy status: Secondary | ICD-10-CM

## 2021-12-15 DIAGNOSIS — Z8249 Family history of ischemic heart disease and other diseases of the circulatory system: Secondary | ICD-10-CM

## 2021-12-15 HISTORY — DX: Sepsis, unspecified organism: R65.20

## 2021-12-15 HISTORY — DX: Myocardial infarction type 2: I21.A1

## 2021-12-15 HISTORY — DX: Sepsis, unspecified organism: A41.9

## 2021-12-15 LAB — CBC WITH DIFFERENTIAL/PLATELET
Abs Immature Granulocytes: 0.08 10*3/uL — ABNORMAL HIGH (ref 0.00–0.07)
Abs Immature Granulocytes: 0.11 10*3/uL — ABNORMAL HIGH (ref 0.00–0.07)
Basophils Absolute: 0.1 10*3/uL (ref 0.0–0.1)
Basophils Absolute: 0.1 10*3/uL (ref 0.0–0.1)
Basophils Relative: 0 %
Basophils Relative: 0 %
Eosinophils Absolute: 0 10*3/uL (ref 0.0–0.5)
Eosinophils Absolute: 0 10*3/uL (ref 0.0–0.5)
Eosinophils Relative: 0 %
Eosinophils Relative: 0 %
HCT: 39.7 % (ref 36.0–46.0)
HCT: 36.4 % (ref 36.0–46.0)
Hemoglobin: 11.7 g/dL — ABNORMAL LOW (ref 12.0–15.0)
Hemoglobin: 10.9 g/dL — ABNORMAL LOW (ref 12.0–15.0)
Immature Granulocytes: 1 %
Immature Granulocytes: 1 %
Lymphocytes Relative: 6 %
Lymphocytes Relative: 7 %
Lymphs Abs: 1 10*3/uL (ref 0.7–4.0)
Lymphs Abs: 1 10*3/uL (ref 0.7–4.0)
MCH: 24.4 pg — ABNORMAL LOW (ref 26.0–34.0)
MCH: 24.5 pg — ABNORMAL LOW (ref 26.0–34.0)
MCHC: 29.5 g/dL — ABNORMAL LOW (ref 30.0–36.0)
MCHC: 29.9 g/dL — ABNORMAL LOW (ref 30.0–36.0)
MCV: 82.7 fL (ref 80.0–100.0)
MCV: 82 fL (ref 80.0–100.0)
Monocytes Absolute: 1.1 10*3/uL — ABNORMAL HIGH (ref 0.1–1.0)
Monocytes Absolute: 1.2 10*3/uL — ABNORMAL HIGH (ref 0.1–1.0)
Monocytes Relative: 7 %
Monocytes Relative: 8 %
Neutro Abs: 14.4 10*3/uL — ABNORMAL HIGH (ref 1.7–7.7)
Neutro Abs: 13 10*3/uL — ABNORMAL HIGH (ref 1.7–7.7)
Neutrophils Relative %: 86 %
Neutrophils Relative %: 84 %
Platelets: 167 10*3/uL (ref 150–400)
Platelets: 156 10*3/uL (ref 150–400)
RBC: 4.8 MIL/uL (ref 3.87–5.11)
RBC: 4.44 MIL/uL (ref 3.87–5.11)
RDW: 18 % — ABNORMAL HIGH (ref 11.5–15.5)
RDW: 17.8 % — ABNORMAL HIGH (ref 11.5–15.5)
WBC: 16.7 10*3/uL — ABNORMAL HIGH (ref 4.0–10.5)
WBC: 15.3 10*3/uL — ABNORMAL HIGH (ref 4.0–10.5)
nRBC: 0 % (ref 0.0–0.2)
nRBC: 0 % (ref 0.0–0.2)

## 2021-12-15 LAB — BLOOD GAS, VENOUS
Acid-base deficit: 6.1 mmol/L — ABNORMAL HIGH (ref 0.0–2.0)
Bicarbonate: 22.4 mmol/L (ref 20.0–28.0)
O2 Saturation: 55.2 %
Patient temperature: 37
pCO2, Ven: 56 mmHg (ref 44–60)
pH, Ven: 7.21 — ABNORMAL LOW (ref 7.25–7.43)
pO2, Ven: 37 mmHg (ref 32–45)

## 2021-12-15 LAB — BLOOD CULTURE ID PANEL (REFLEXED) - BCID2

## 2021-12-15 LAB — COMPREHENSIVE METABOLIC PANEL
ALT: 17 U/L (ref 0–44)
AST: 33 U/L (ref 15–41)
Albumin: 3.3 g/dL — ABNORMAL LOW (ref 3.5–5.0)
Alkaline Phosphatase: 43 U/L (ref 38–126)
Anion gap: 10 (ref 5–15)
BUN: 25 mg/dL — ABNORMAL HIGH (ref 8–23)
CO2: 20 mmol/L — ABNORMAL LOW (ref 22–32)
Calcium: 9.8 mg/dL (ref 8.9–10.3)
Chloride: 102 mmol/L (ref 98–111)
Creatinine, Ser: 1.04 mg/dL — ABNORMAL HIGH (ref 0.44–1.00)
GFR, Estimated: 53 mL/min — ABNORMAL LOW (ref >60)
Glucose, Bld: 332 mg/dL — ABNORMAL HIGH (ref 70–99)
Potassium: 4 mmol/L (ref 3.5–5.1)
Sodium: 132 mmol/L — ABNORMAL LOW (ref 135–145)
Total Bilirubin: 0.5 mg/dL (ref 0.3–1.2)
Total Protein: 6.8 g/dL (ref 6.5–8.1)

## 2021-12-15 LAB — APTT: aPTT: 31 seconds (ref 24–36)

## 2021-12-15 LAB — PROTIME-INR
INR: 1.3 — ABNORMAL HIGH (ref 0.8–1.2)
Prothrombin Time: 16 seconds — ABNORMAL HIGH (ref 11.4–15.2)

## 2021-12-15 LAB — GROUP A STREP BY PCR: Group A Strep by PCR: NOT DETECTED

## 2021-12-15 LAB — LIPID PANEL
Cholesterol: 110 mg/dL (ref 0–200)
HDL: 19 mg/dL — ABNORMAL LOW (ref >40)
LDL Cholesterol: 46 mg/dL (ref 0–99)
Total CHOL/HDL Ratio: 5.8 RATIO
Triglycerides: 223 mg/dL — ABNORMAL HIGH (ref <150)
VLDL: 45 mg/dL — ABNORMAL HIGH (ref 0–40)

## 2021-12-15 LAB — TROPONIN I (HIGH SENSITIVITY)
Troponin I (High Sensitivity): 475 ng/L (ref <18)
Troponin I (High Sensitivity): 424 ng/L (ref <18)
Troponin I (High Sensitivity): 366 ng/L (ref <18)
Troponin I (High Sensitivity): 342 ng/L (ref <18)

## 2021-12-15 LAB — PROCALCITONIN: Procalcitonin: 1.17 ng/mL

## 2021-12-15 LAB — BRAIN NATRIURETIC PEPTIDE: B Natriuretic Peptide: 373.4 pg/mL — ABNORMAL HIGH (ref 0.0–100.0)

## 2021-12-15 LAB — LACTIC ACID, PLASMA
Lactic Acid, Venous: 2.4 mmol/L (ref 0.5–1.9)
Lactic Acid, Venous: 2.4 mmol/L (ref 0.5–1.9)
Lactic Acid, Venous: 2.3 mmol/L (ref 0.5–1.9)
Lactic Acid, Venous: 1.9 mmol/L (ref 0.5–1.9)

## 2021-12-15 LAB — SARS CORONAVIRUS 2 BY RT PCR: SARS Coronavirus 2 by RT PCR: NEGATIVE

## 2021-12-15 LAB — HEMOGLOBIN A1C
Hgb A1c MFr Bld: 7.1 % — ABNORMAL HIGH (ref 4.8–5.6)
Mean Plasma Glucose: 157.07 mg/dL

## 2021-12-15 LAB — LIPASE, BLOOD: Lipase: 22 U/L (ref 11–51)

## 2021-12-15 MED ORDER — ASPIRIN 81 MG PO TBEC
81.0000 mg | DELAYED_RELEASE_TABLET | Freq: Every day | ORAL | Status: DC
  Administered 2021-12-16 – 2021-12-19 (×4): 81 mg via ORAL
  Filled 2021-12-15 (×4): qty 1

## 2021-12-15 MED ORDER — LACTATED RINGERS IV BOLUS (SEPSIS)
1000.0000 mL | Freq: Once | INTRAVENOUS | Status: AC
  Administered 2021-12-15: 1000 mL via INTRAVENOUS

## 2021-12-15 MED ORDER — ACETAMINOPHEN 325 MG PO TABS: 650.0000 mg | ORAL_TABLET | Freq: Four times a day (QID) | ORAL | Status: DC | PRN

## 2021-12-15 MED ORDER — DM-GUAIFENESIN ER 30-600 MG PO TB12: 1.0000 | ORAL_TABLET | Freq: Two times a day (BID) | ORAL | Status: DC | PRN

## 2021-12-15 MED ORDER — METOPROLOL SUCCINATE ER 25 MG PO TB24
12.5000 mg | ORAL_TABLET | Freq: Every day | ORAL | Status: DC
Start: 2021-12-15 — End: 2021-12-17
  Administered 2021-12-15 – 2021-12-16 (×2): 12.5 mg via ORAL
  Filled 2021-12-15 (×2): qty 1

## 2021-12-15 MED ORDER — POLYVINYL ALCOHOL 1.4 % OP SOLN: 1.0000 [drp] | Freq: Two times a day (BID) | OPHTHALMIC | Status: DC | PRN

## 2021-12-15 MED ORDER — PANTOPRAZOLE SODIUM 40 MG PO TBEC
40.0000 mg | DELAYED_RELEASE_TABLET | Freq: Every day | ORAL | Status: DC
  Administered 2021-12-16 – 2021-12-19 (×4): 40 mg via ORAL
  Filled 2021-12-15 (×4): qty 1

## 2021-12-15 MED ORDER — DABIGATRAN ETEXILATE MESYLATE 150 MG PO CAPS
150.0000 mg | ORAL_CAPSULE | Freq: Two times a day (BID) | ORAL | Status: DC
  Administered 2021-12-15 – 2021-12-19 (×8): 150 mg via ORAL
  Filled 2021-12-15 (×8): qty 1

## 2021-12-15 MED ORDER — ONDANSETRON HCL 4 MG/2ML IJ SOLN: 4.0000 mg | Freq: Three times a day (TID) | INTRAMUSCULAR | Status: DC | PRN

## 2021-12-15 MED ORDER — TIZANIDINE HCL 2 MG PO TABS: 2.0000 mg | ORAL_TABLET | Freq: Two times a day (BID) | ORAL | Status: DC | PRN

## 2021-12-15 MED ORDER — VANCOMYCIN HCL 2000 MG/400ML IV SOLN
2000.0000 mg | Freq: Once | INTRAVENOUS | Status: AC
  Administered 2021-12-15: 2000 mg via INTRAVENOUS
  Filled 2021-12-15: qty 400

## 2021-12-15 MED ORDER — DICLOFENAC SODIUM 1 % EX GEL
4.0000 g | Freq: Four times a day (QID) | CUTANEOUS | Status: DC
  Administered 2021-12-15 – 2021-12-19 (×15): 4 g via TOPICAL
  Filled 2021-12-15: qty 100

## 2021-12-15 MED ORDER — IOHEXOL 350 MG/ML SOLN
75.0000 mL | Freq: Once | INTRAVENOUS | Status: AC | PRN
  Administered 2021-12-15: 75 mL via INTRAVENOUS

## 2021-12-15 MED ORDER — HYDRALAZINE HCL 20 MG/ML IJ SOLN
5.0000 mg | INTRAMUSCULAR | Status: DC | PRN
Start: 2021-12-15 — End: 2021-12-19

## 2021-12-15 MED ORDER — VANCOMYCIN HCL 750 MG/150ML IV SOLN: 750.0000 mg | INTRAVENOUS | Status: DC

## 2021-12-15 MED ORDER — ADULT MULTIVITAMIN W/MINERALS CH
1.0000 | ORAL_TABLET | Freq: Every day | ORAL | Status: DC
  Administered 2021-12-15 – 2021-12-19 (×5): 1 via ORAL
  Filled 2021-12-15 (×5): qty 1

## 2021-12-15 MED ORDER — SODIUM CHLORIDE 0.9 % IV SOLN
500.0000 mg | Freq: Once | INTRAVENOUS | Status: AC
  Administered 2021-12-15: 500 mg via INTRAVENOUS
  Filled 2021-12-15: qty 5

## 2021-12-15 MED ORDER — ALBUTEROL SULFATE (2.5 MG/3ML) 0.083% IN NEBU: 2.5000 mg | INHALATION_SOLUTION | RESPIRATORY_TRACT | Status: DC | PRN

## 2021-12-15 MED ORDER — SODIUM CHLORIDE 0.9 % IV SOLN: INTRAVENOUS | Status: DC

## 2021-12-15 MED ORDER — GABAPENTIN 100 MG PO CAPS
100.0000 mg | ORAL_CAPSULE | Freq: Three times a day (TID) | ORAL | Status: DC
  Administered 2021-12-15 – 2021-12-19 (×12): 100 mg via ORAL
  Filled 2021-12-15 (×12): qty 1

## 2021-12-15 MED ORDER — PAROXETINE HCL 30 MG PO TABS
30.0000 mg | ORAL_TABLET | Freq: Every day | ORAL | Status: DC
  Administered 2021-12-16 – 2021-12-19 (×4): 30 mg via ORAL
  Filled 2021-12-15 (×4): qty 1

## 2021-12-15 MED ORDER — BIOTIN 10 MG PO TABS: 10.0000 mg | ORAL_TABLET | Freq: Every day | ORAL | Status: DC

## 2021-12-15 MED ORDER — LACTATED RINGERS IV BOLUS
1000.0000 mL | Freq: Once | INTRAVENOUS | Status: AC
  Administered 2021-12-15: 1000 mL via INTRAVENOUS

## 2021-12-15 MED ORDER — SODIUM CHLORIDE 0.9 % IV SOLN
2.0000 g | Freq: Once | INTRAVENOUS | Status: AC
  Administered 2021-12-15: 2 g via INTRAVENOUS
  Filled 2021-12-15: qty 12.5

## 2021-12-15 MED ORDER — ASPIRIN 81 MG PO CHEW
324.0000 mg | CHEWABLE_TABLET | Freq: Once | ORAL | Status: AC
  Administered 2021-12-15: 324 mg via ORAL
  Filled 2021-12-15: qty 4

## 2021-12-15 MED ORDER — VITAMIN D3 25 MCG (1000 UNIT) PO TABS
1000.0000 [IU] | ORAL_TABLET | Freq: Every day | ORAL | Status: DC
  Administered 2021-12-16 – 2021-12-19 (×4): 1000 [IU] via ORAL
  Filled 2021-12-15 (×8): qty 1

## 2021-12-15 MED ORDER — LETROZOLE 2.5 MG PO TABS
2.5000 mg | ORAL_TABLET | Freq: Every day | ORAL | Status: DC
  Administered 2021-12-16 – 2021-12-19 (×4): 2.5 mg via ORAL
  Filled 2021-12-15 (×4): qty 1

## 2021-12-15 MED ORDER — SODIUM CHLORIDE 0.9 % IV SOLN
2.0000 g | INTRAVENOUS | Status: DC
  Administered 2021-12-16 – 2021-12-18 (×3): 2 g via INTRAVENOUS
  Filled 2021-12-15 (×4): qty 20

## 2021-12-15 MED ORDER — SODIUM CHLORIDE 0.9 % IV SOLN
2.0000 g | Freq: Two times a day (BID) | INTRAVENOUS | Status: DC
  Administered 2021-12-15: 2 g via INTRAVENOUS
  Filled 2021-12-15: qty 2

## 2021-12-15 NOTE — ED Triage Notes (Signed)
Pt presents via EMS from [facility] c/o hypoxia per facility. EMS reports pt denies SOB however endorses feeling unwell for the past several days. Per EMS pt was tested for Covid at facility with negative result. EMS also reports low grade fevers at facility. Does not wear 02 at baseline. Presents on 4L 02 with 02 saturation 91%.

## 2021-12-15 NOTE — Progress Notes (Signed)
PHARMACIST - PHYSICIAN ORDER COMMUNICATION  CONCERNING: P&T Medication Policy on Herbal Medications  DESCRIPTION:  This patient's order for:  biotin  has been noted.  This product(s) is classified as an "herbal" or natural product. Due to a lack of definitive safety studies or FDA approval, nonstandard manufacturing practices, plus the potential risk of unknown drug-drug interactions while on inpatient medications, the Pharmacy and Therapeutics Committee does not permit the use of "herbal" or natural products of this type within [facility].   ACTION TAKEN: The pharmacy department is unable to verify this order at this time and your patient has been informed of this safety policy. Please reevaluate patient's clinical condition at discharge and address if the herbal or natural product(s) should be resumed at that time.  Markanthony Gedney Rodriguez-Guzman PharmD, BCPS 12/15/2021 8:53 PM

## 2021-12-15 NOTE — Assessment & Plan Note (Addendum)
Stable Continue Paxil 

## 2021-12-15 NOTE — Assessment & Plan Note (Addendum)
Type 2 MI, Most likely secondary to demand ischemia from severe sepsis and hypoxia Troponin 475, no chest pain. Continue aspirin and metoprolol 2D echocardiogram to assess LVEF and rule out regional wall motion abnormality Cardiology consult

## 2021-12-15 NOTE — Assessment & Plan Note (Signed)
Recent A1c 6.6, well controlled.  Patient is taking metformin and glipizide. Oral intake has been poor since admission Check blood sugars with meals and start sliding scale if patient has hyperglycemia Start patient on Glucerna

## 2021-12-15 NOTE — Progress Notes (Signed)
PHARMACY - PHYSICIAN COMMUNICATION CRITICAL VALUE ALERT - BLOOD CULTURE IDENTIFICATION (BCID)  Results for orders placed or performed during the hospital encounter of 12/15/21  Blood Culture (routine x 2)     Status: None (Preliminary result)   Collection Time: 12/15/21 10:11 AM   Specimen: BLOOD  Result Value Ref Range Status   Specimen Description BLOOD LEFT ANTECUBITAL  Final   Special Requests   Final    BOTTLES DRAWN AEROBIC AND ANAEROBIC Blood Culture results may not be optimal due to an inadequate volume of blood received in culture bottles   Culture  Setup Time   Final    Organism ID to follow GRAM NEGATIVE RODS IN BOTH AEROBIC AND ANAEROBIC BOTTLES CRITICAL RESULT CALLED TO, READ BACK BY AND VERIFIED WITH: Sheyanne Munley BLUE 12/15/21 2247 DE Performed at [facility], [address] [place], [place] [address]    Culture PENDING  Incomplete   Report Status PENDING  Incomplete  SARS Coronavirus 2 by RT PCR (hospital order, performed in [facility] hospital lab) *cepheid single result test* Anterior Nasal Swab     Status: None   Collection Time: 12/15/21 10:11 AM   Specimen: Anterior Nasal Swab  Result Value Ref Range Status   SARS Coronavirus 2 by RT PCR NEGATIVE NEGATIVE Final    Comment: (NOTE) SARS-CoV-2 target nucleic acids are NOT DETECTED.  The SARS-CoV-2 RNA is generally detectable in upper and lower respiratory specimens during the acute phase of infection. The lowest concentration of SARS-CoV-2 viral copies this assay can detect is 250 copies / mL. A negative result does not preclude SARS-CoV-2 infection and should not be used as the sole basis for treatment or other patient management decisions.  A negative result may occur with improper specimen collection / handling, submission of specimen other than nasopharyngeal swab, presence of viral mutation(s) within the areas targeted by this assay, and inadequate number of viral copies (<250 copies / mL). A  negative result must be combined with clinical observations, patient history, and epidemiological information.  Fact Sheet for Patients:   https://www.patel.info/  Fact Sheet for Healthcare Providers: https://hall.com/  This test is not yet approved or  cleared by the [place] FDA and has been authorized for detection and/or diagnosis of SARS-CoV-2 by FDA under an Emergency Use Authorization (EUA).  This EUA will remain in effect (meaning this test can be used) for the duration of the COVID-19 declaration under Section 564(b)(1) of the Act, 21 U.S.C. section 360bbb-3(b)(1), unless the authorization is terminated or revoked sooner.  Performed at [facility], [address] [place], [place] [address]   Group A Strep by PCR     Status: None   Collection Time: 12/15/21 10:11 AM   Specimen: Throat; Sterile Swab  Result Value Ref Range Status   Group A Strep by PCR NOT DETECTED NOT DETECTED Final    Comment: Performed at [facility], [address] [place],  [address]   BCID results:  2 (1 set) of 4 bottles w/ E. Coli, no resistance.  Pt currently on Cefepime and Vancomycin.  Name of provider contacted: Morton Amy, NP   Changes to prescribed antibiotics required: Transition pt to Ceftriaxone 2 gm, continue Vancomycin for now.  Renda Rolls, PharmD, [facility] 12/15/2021 11:05 PM

## 2021-12-15 NOTE — Assessment & Plan Note (Signed)
-   IV hydralazine as needed -Hold home Cozaar since patient at high risk of developing hypotension due to severe sepsis -Continue metoprolol

## 2021-12-15 NOTE — Assessment & Plan Note (Addendum)
Secondary to healthcare associated pneumonia. Patient is currently on 6 L of oxygen We will attempt to wean off oxygen as tolerated

## 2021-12-15 NOTE — Assessment & Plan Note (Signed)
" >>  ASSESSMENT AND PLAN FOR TYPE 2 DIABETES MELLITUS WITH OTHER SPECIFIED COMPLICATION, WITHOUT LONG-TERM CURRENT USE OF INSULIN  (HCC) WRITTEN ON 12/17/2021  1:16 PM BY AGBATA, TOCHUKWU, MD  Recent A1c 6.6, well controlled.  Patient is taking metformin  and glipizide . Oral intake has been poor since admission Check blood sugars with meals and start sliding scale if patient has hyperglycemia Start patient on Glucerna "

## 2021-12-15 NOTE — Assessment & Plan Note (Addendum)
S/p of radiation therapy Continue letrozole

## 2021-12-15 NOTE — Assessment & Plan Note (Addendum)
Appears to be multifactorial and secondary to healthcare associated pneumonia as well as a UTI CT angio shows asymmetric airspace disease at the left base and posteriorly in the right upper lobe concerning for pneumonia Patient also has pyuria Patient also noted to have E. coli bacteremia, blood cultures yielded greater than 100,000 CFU of E. coli Continue empiric antibiotic therapy with Rocephin and azithromycin Patient will require IV antibiotic therapy for at least 10 days for bacteremia

## 2021-12-15 NOTE — H&P (Signed)
History and Physical    Julie Acosta GMW:[id number] DOB: [birth year] DOA: 12/15/2021  Referring MD/NP/PA:   PCP: [facility]   Patient coming from:  The patient is coming from SNF   Chief Complaint: weakness   HPI: WEDNESDAY Julie Acosta is a 88 y.o. 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  Patient stated she has generalized weakness in the past several days, which has been progressively worsening.  He has nausea, denies vomiting, diarrhea or abdominal pain. Patient is normally not using oxygen, but she was found to have oxygen desaturation to 78% on room air in ED, which improved to 92-93% on 6 L oxygen however, patient denies cough, shortness breath and chest pain.  No fever or chills.  Denies symptoms of UTI.  No unilateral numbness or tinglings in extremities.  No facial droop or slurred speech.  She has poor appetite and decreased oral intake.  She reports sore throat.  Data reviewed independently and ED Course: pt was found to have WBC 16.7, negative COVID PCR, lactic acid 2.4, INR 1.3, PTT 31, troponin level 475, mild AKI with creatinine 1.04, BUN 25, GFR 53.  Liver function normal, lipase 22.  Temperature normal, blood pressure 115/60, heart rate 111, RR 25.  Chest x-ray showed possible left lower lobe infiltration.  CTA negative for PE, showed infiltration in left base and the right upper lobe.  Patient is admitted to PCU as inpatient   CTA of chest and CT-abd/pelvis: 1. No pulmonary embolus. 2. Asymmetric airspace disease at the left base and posteriorly in the right upper lobe concerning for pneumonia. 3. Cardiomegaly with enlargement of the main pulmonary  arteries suggesting pulmonary arterial hypertension. 4. Stable right middle lobe and right lower lobe pulmonary nodules. 5. Bilateral nonobstructing renal stones. 6. Descending and sigmoid diverticulosis without evidence for diverticulitis. 7. Stable 21 mm left adnexal cyst. No follow-up imaging recommended. Note: This recommendation does not apply to premenarchal patients and to those with increased risk (genetic, family history, elevated tumor markers or other high-risk factors) of ovarian cancer. Reference: JACR 2020 Feb; 17(2):248-254 8. Degenerative changes in the lower lumbar spine. 9. Aortic Atherosclerosis (ICD10-I70.0).     EKG: I have personally reviewed.  Sinus rhythm, QTc 437, poor R wave progression, bifascicular block.   Review of Systems:   General: no fevers, chills, no body weight gain, has poor appetite, has fatigue HEENT: no blurry vision, hearing changes. has sore throat Respiratory: no dyspnea, coughing, wheezing CV: no chest pain, no palpitations GI: has nausea, no vomiting, abdominal pain, diarrhea, constipation GU: no dysuria, burning on urination, increased urinary frequency, hematuria  Ext: no leg edema.  Neuro: no unilateral weakness, numbness, or tingling, no vision change or hearing loss Skin: no rash, no skin tear. MSK: No muscle spasm, no deformity, no limitation of range of movement in spin Heme: No easy bruising.  Travel history: No recent long distant travel.   Allergy:  Allergies  Allergen Reactions   Ciprofloxacin Hives   Codeine Other (See Comments)    HALLUCINATIONS   Tape     Rash and skin irritation/ paper tape and tegaderm OK   Ciprocinonide [Fluocinolone] Other (See Comments)    unknown   Amoxicillin Other (See Comments)    Upset stomach Has patient had a  PCN reaction causing immediate rash, facial/tongue/throat swelling, SOB or lightheadedness with hypotension: No Has patient had a PCN reaction causing severe rash involving  mucus membranes or skin necrosis: No Has patient had a PCN reaction that required hospitalization: No Has patient had a PCN reaction occurring within the last 10 years: Yes If all of the above answers are "NO", then may proceed with Cephalosporin use.;   Cefuroxime Other (See Comments)    OK INTRACAMERALLY PER DR WLP, upset stomach   Latex Rash    Rast test NEGATIVE   Lipitor [Atorvastatin] Other (See Comments)    unknown    Past Medical History:  Diagnosis Date   Anxiety    Benign breast lumps    Blood clotting disorder (HCC)    Bone cancer ([place])    head of femur, left leg ... amputation at age 6   Breast cancer of upper-outer quadrant of left female breast ([place]) 07/27/2017   11 mm invasive mammary carcinoma, ER 90%, PR 51-90%, negative margins.  Oncotype recurrence score: 1.  DCIS, margin less than 0.5 mm.  Negative sentinel node.  Accelerated partial breast radiation.   Cervicalgia    Chronic kidney disease    kidney stones   Colon cancer [facility])    patient unaware of this   Complication of anesthesia    mood alteration / not sure if d/t pain medicine or anesthesia as she passed out    Cough    NASAL DRIP / SNEEZING / SORE THROAT MOSTLY CONSTANT   Depression    Diabetes ([place])    Diverticulitis    Dizzy    GERD (gastroesophageal reflux disease)    H/O blood clots    arm and leg   HBP (high blood pressure)    Hematuria    gross   Hemorrhoids    HLD (hyperlipidemia)    Muscle pain    Osteoarthritis    Osteoporosis    Panic disorder    Personal history of radiation therapy    PND (post-nasal drip)    CHRONIC WITH SORE THROAT AND SNEEZING   Reflux    Skin cancer    nose   Swelling    Tremor    Tremors of nervous system     Past Surgical History:  Procedure Laterality Date   APPENDECTOMY  1962   BREAST BIOPSY Right 2006?   benign   BREAST BIOPSY Left 2019   invasive mammary carcinoma   BREAST CYST EXCISION Left 07/27/2017   Procedure: SKIN CYST EXCISED;   Surgeon: Robert Bellow, MD;  Location: [facility] ORS;  Service: General;  Laterality: Left;   BREAST LUMPECTOMY Left 07/2017   invasive mammary, DCIS  mammosite   BREAST SURGERY Right 2019   CARPAL TUNNEL RELEASE Right    CATARACT EXTRACTION W/PHACO Left 11/11/2016   Procedure: CATARACT EXTRACTION PHACO AND INTRAOCULAR LENS PLACEMENT ([place]);  Surgeon: Birder Robson, MD;  Location: [facility] ORS;  Service: Ophthalmology;  Laterality: Left;  [place] 01:07.9 AP% 19.2 CDE 13.02 Fluid pack lot # [id number]   CATARACT EXTRACTION W/PHACO Right 12/09/2016   Procedure: CATARACT EXTRACTION PHACO AND INTRAOCULAR LENS PLACEMENT (IOC);  Surgeon: Birder Robson, MD;  Location: [facility] ORS;  Service: Ophthalmology;  Laterality: Right;  [place] 00:49 AP% 21.2 CDE 10.39 Fluid pack lot # [id number]   CHOLECYSTECTOMY     COLONOSCOPY WITH PROPOFOL N/A 11/22/2014   Procedure: COLONOSCOPY WITH PROPOFOL;  Surgeon: Manya Silvas, MD;  Location: [facility] ENDOSCOPY;  Service: Endoscopy;  Laterality: N/A;  CYSTOSCOPY WITH STENT PLACEMENT Bilateral 11/27/2018   Procedure: CYSTOSCOPY WITH STENT PLACEMENT;  Surgeon: Festus Aloe, MD;  Location: [facility] ORS;  Service: Urology;  Laterality: Bilateral;   CYSTOSCOPY/URETEROSCOPY/HOLMIUM LASER/STENT PLACEMENT Bilateral 12/17/2018   Procedure: CYSTOSCOPY/URETEROSCOPY/HOLMIUM LASER/STENT Exchange;  Surgeon: Billey Co, MD;  Location: [facility] ORS;  Service: Urology;  Laterality: Bilateral;   ESOPHAGOGASTRODUODENOSCOPY  11/22/2014   Procedure: ESOPHAGOGASTRODUODENOSCOPY (EGD);  Surgeon: Manya Silvas, MD;  Location: [facility] ENDOSCOPY;  Service: Endoscopy;;   ESOPHAGOGASTRODUODENOSCOPY (EGD) WITH PROPOFOL N/A 03/05/2017   Procedure: ESOPHAGOGASTRODUODENOSCOPY (EGD) WITH PROPOFOL;  Surgeon: Lin Landsman, MD;  Location: [facility]COPY;  Service: Gastroenterology;  Laterality: N/A;   EXTRACORPOREAL SHOCK WAVE LITHOTRIPSY Right 03/04/2018   Procedure: EXTRACORPOREAL SHOCK WAVE LITHOTRIPSY  (ESWL);  Surgeon: Billey Co, MD;  Location: [facility] ORS;  Service: Urology;  Laterality: Right;   EYE SURGERY Bilateral 2012   cataract extraction with iol   GALLBLADDER SURGERY  1968   hemi pelvectomy     left side at age 15   HEMIPELVIC Left [place] Left    AMPUTATION d/t cancer at 88 years old   MASTECTOMY, PARTIAL Left 07/27/2017   Procedure: MASTECTOMY PARTIAL;  Surgeon: Robert Bellow, MD;  Location: [facility] ORS;  Service: General;  Laterality: Left;   MOUTH SURGERY  2019   teeth pulled with a bridge insertion.   fell out 12/16/18   OVARY SURGERY Right    cyst removed   SAVORY DILATION  11/22/2014   Procedure: SAVORY DILATION;  Surgeon: Manya Silvas, MD;  Location: [facility] ENDOSCOPY;  Service: Endoscopy;;   SENTINEL NODE BIOPSY Left 07/27/2017   Procedure: SENTINEL NODE BIOPSY;  Surgeon: Robert Bellow, MD;  Location: [facility] ORS;  Service: General;  Laterality: Left;   SKIN CANCER EXCISION     nose and arm and face   TEE WITHOUT CARDIOVERSION N/A 07/25/2019   Procedure: TRANSESOPHAGEAL ECHOCARDIOGRAM (TEE);  Surgeon: Corey Skains, MD;  Location: [facility] ORS;  Service: Cardiovascular;  Laterality: N/A;   TONSILLECTOMY      Social History:  reports that she has never smoked. She has never used smokeless tobacco. She reports that she does not drink alcohol and does not use drugs.  Family History:  Family History  Problem Relation Age of Onset   Breast cancer Paternal 26    Ovarian cancer Sister    Prostate cancer Father    Stroke Mother    Kidney cancer Neg Hx    Bladder Cancer Neg Hx      Prior to Admission medications   Medication Sig Start Date End Date Taking? Authorizing Provider  acetaminophen (TYLENOL) 500 MG tablet Take 500 mg by mouth every 6 (six) hours as needed.    [provider]  acetaminophen (TYLENOL) 650 MG CR tablet Take 650 mg by mouth every 8 (eight) hours as needed for pain.    [provider]  baclofen (LIORESAL)  10 MG tablet as needed. Patient not taking: Reported on 01/22/2021 07/06/13   [provider]  Biotin 10 MG TABS Take by mouth.    [provider]  Blood Glucose Monitoring Suppl (FIFTY50 GLUCOSE METER 2.0) w/Device KIT See admin instructions. Patient not taking: Reported on 01/22/2021 02/13/20   [provider]  Cholecalciferol 25 MCG (1000 UT) tablet Take by mouth.    [provider]  Dentifrices (BIOTENE DRY MOUTH DT) by Transmucosal route.    [provider]  diclofenac Sodium (VOLTAREN) 1 % GEL  Apply topically 4 (four) times daily.    [provider]  fenofibrate 160 MG tablet TAKE ONE TABLET BY MOUTH EVERY DAY Patient not taking: Reported on 01/22/2021 05/23/19   [provider]  GLIPIZIDE XL 2.5 MG 24 hr tablet Take 2.5 mg by mouth daily.  05/09/13   [provider]  letrozole ([place]) 2.5 MG tablet TAKE ONE TABLET BY MOUTH EVERY DAY 11/19/20   Lloyd Huger, MD  loratadine (CLARITIN) 10 MG tablet Take 10 mg by mouth daily.  Patient not taking: Reported on 01/22/2021    [provider]  losartan (COZAAR) 50 MG tablet Take 50 mg by mouth daily. 10/19/19   [provider]  metFORMIN (GLUCOPHAGE) 500 MG tablet Take 1,000 mg by mouth 2 (two) times daily.  03/15/13   [provider]  metoprolol succinate (TOPROL-XL) 25 MG 24 hr tablet Take 12.5 mg by mouth at bedtime.  04/18/13   [provider]  Multiple Vitamin (MULTIVITAMIN WITH MINERALS) TABS tablet Take 1 tablet by mouth daily. 07/28/19   Ezekiel Slocumb, DO  nitrofurantoin, macrocrystal-monohydrate, (MACROBID) 100 MG capsule Take 100 mg by mouth 2 (two) times daily.    [provider]  nystatin (MYCOSTATIN/NYSTOP) powder Apply 1 application topically. Apply to right groin daily Patient not taking: Reported on 07/23/2021    [provider]  omeprazole (PRILOSEC) 40 MG capsule Take 40 mg by mouth daily.  08/11/18    [provider]  PARoxetine (PAXIL) 40 MG tablet Take 40 mg by mouth daily.  02/28/13   [provider]  Polyethyl Glycol-Propyl Glycol 0.4-0.3 % SOLN Apply to eye 2 (two) times daily as needed.    [provider]  PRADAXA 150 MG CAPS capsule TAKE 1 CAPSULE BY MOUTH TWICE DAILY START TREATMENT 10 HRS LAST DOSE OF LOVENOX 12/03/20   Lloyd Huger, MD  raloxifene (EVISTA) 60 MG tablet Take 60 mg by mouth daily.  05/02/13   [provider]    Physical Exam: Vitals:   12/15/21 1100 12/15/21 1102 12/15/21 1130 12/15/21 1301  BP: (!) 107/49 (!) 107/49 (!) 111/59 115/60  Pulse: (!) 108 (!) 108 (!) 106 (!) 111  Resp: 15 (!) 25 18 (!) 23  Temp:   98.9 F (37.2 C) 98.2 F (36.8 C)  TempSrc:   Oral Oral  SpO2: 93% 92% 92% 93%  Weight:       General: Not in acute distress HEENT:       Eyes: PERRL, EOMI, no scleral icterus.       ENT: No discharge from the ears and nose, has pharynx injection, no tonsillar enlargement.        Neck: No JVD, no bruit, no mass felt. Heme: No neck lymph node enlargement. Cardiac: S1/S2, RRR, No murmurs, No gallops or rubs. Respiratory: has fine crackles bilaterally GI: Soft, nondistended, nontender, no rebound pain, no organomegaly, BS present. GU: No hematuria Ext: No pitting leg edema bilaterally. 1+DP/PT pulse in right leg. S/p of left leg amputation Musculoskeletal: No joint deformities, No joint redness or warmth, no limitation of ROM in spin. Skin: No rashes.  Neuro: Alert, oriented X3, cranial nerves II-XII grossly intact, moves all extremities. Psych: Patient is not psychotic, no suicidal or hemocidal ideation.  Labs on Admission: I have personally reviewed following labs and imaging studies  CBC: Recent Labs  Lab 12/15/21 1011  WBC 16.7*  NEUTROABS 14.4*  HGB 11.7*  HCT 39.7  MCV 82.7  PLT 167  Basic Metabolic Panel: Recent Labs  Lab 12/15/21 1011  NA 132*  K 4.0  CL 102  CO2 20*  GLUCOSE 332*   BUN 25*  CREATININE 1.04*  CALCIUM 9.8   GFR: CrCl cannot be calculated (Unknown ideal weight.). Liver Function Tests: Recent Labs  Lab 12/15/21 1011  AST 33  ALT 17  ALKPHOS 43  BILITOT 0.5  PROT 6.8  ALBUMIN 3.3*   Recent Labs  Lab 12/15/21 1023  LIPASE 22   No results for input(s): "AMMONIA" in the last 168 hours. Coagulation Profile: Recent Labs  Lab 12/15/21 1011  INR 1.3*   Cardiac Enzymes: No results for input(s): "CKTOTAL", "CKMB", "CKMBINDEX", "TROPONINI" in the last 168 hours. BNP (last 3 results) No results for input(s): "PROBNP" in the last 8760 hours. HbA1C: No results for input(s): "HGBA1C" in the last 72 hours. CBG: No results for input(s): "GLUCAP" in the last 168 hours. Lipid Profile: No results for input(s): "CHOL", "HDL", "LDLCALC", "TRIG", "CHOLHDL", "LDLDIRECT" in the last 72 hours. Thyroid Function Tests: No results for input(s): "TSH", "T4TOTAL", "FREET4", "T3FREE", "THYROIDAB" in the last 72 hours. Anemia Panel: No results for input(s): "VITAMINB12", "FOLATE", "FERRITIN", "TIBC", "IRON", "RETICCTPCT" in the last 72 hours. Urine analysis:    Component Value Date/Time   COLORURINE YELLOW (A) 07/23/2019 1317   APPEARANCEUR CLEAR (A) 07/23/2019 1317   APPEARANCEUR Cloudy (A) 11/26/2018 1157   LABSPEC >1.046 (H) 07/23/2019 1317   PHURINE 5.0 07/23/2019 1317   GLUCOSEU >=500 (A) 07/23/2019 1317   HGBUR NEGATIVE 07/23/2019 1317   BILIRUBINUR NEGATIVE 07/23/2019 1317   BILIRUBINUR Negative 11/26/2018 [place] 07/23/2019 1317   PROTEINUR NEGATIVE 07/23/2019 1317   NITRITE NEGATIVE 07/23/2019 1317   LEUKOCYTESUR NEGATIVE 07/23/2019 1317   Sepsis Labs: _0 (procalcitonin:4,lacticidven:4) ) Recent Results (from the past 240 hour(s))  SARS Coronavirus 2 by RT PCR (hospital order, performed in [place] hospital lab) *cepheid single result test* Anterior Nasal Swab     Status: None   Collection Time: 12/15/21 10:11  AM   Specimen: Anterior Nasal Swab  Result Value Ref Range Status   SARS Coronavirus 2 by RT PCR NEGATIVE NEGATIVE Final    Comment: (NOTE) SARS-CoV-2 target nucleic acids are NOT DETECTED.  The SARS-CoV-2 RNA is generally detectable in upper and lower respiratory specimens during the acute phase of infection. The lowest concentration of SARS-CoV-2 viral copies this assay can detect is 250 copies / mL. A negative result does not preclude SARS-CoV-2 infection and should not be used as the sole basis for treatment or other patient management decisions.  A negative result may occur with improper specimen collection / handling, submission of specimen other than nasopharyngeal swab, presence of viral mutation(s) within the areas targeted by this assay, and inadequate number of viral copies (<250 copies / mL). A negative result must be combined with clinical observations, patient history, and epidemiological information.  Fact Sheet for Patients:   https://www.patel.info/  Fact Sheet for Healthcare Providers: https://hall.com/  This test is not yet approved or  cleared by the [place] FDA and has been authorized for detection and/or diagnosis of SARS-CoV-2 by FDA under an Emergency Use Authorization (EUA).  This EUA will remain in effect (meaning this test can be used) for the duration of the COVID-19 declaration under Section 564(b)(1) of the Act, 21 U.S.C. section 360bbb-3(b)(1), unless the authorization is terminated or revoked sooner.  Performed at [facility], [address], [place], [place] [address]  Radiological Exams on Admission: CT Angio Chest PE W and/or Wo Contrast  Result Date: 12/15/2021 CLINICAL DATA:  Pulmonary embolism suspected, high probability. Abdominal pain, acute, nonlocalized. Hypoxia EXAM: CT ANGIOGRAPHY CHEST CT ABDOMEN AND PELVIS WITH CONTRAST TECHNIQUE: Multidetector CT imaging of the chest  was performed using the standard protocol during bolus administration of intravenous contrast. Multiplanar CT image reconstructions and MIPs were obtained to evaluate the vascular anatomy. Multidetector CT imaging of the abdomen and pelvis was performed using the standard protocol during bolus administration of intravenous contrast. RADIATION DOSE REDUCTION: This exam was performed according to the departmental dose-optimization program which includes automated exposure control, adjustment of the mA and/or kV according to patient size and/or use of iterative reconstruction technique. CONTRAST:  43m OMNIPAQUE IOHEXOL 350 MG/ML SOLN COMPARISON:  CT chest with contrast 07/23/2019 scratched at CT of the chest, abdomen and pelvis with contrast 07/23/2019 FINDINGS: CTA CHEST FINDINGS Cardiovascular: Heart is enlarged. Atherosclerotic calcifications are present at the aortic arch and great vessel origins. New significant stenosis or aneurysm is present. Pulmonary artery opacification is excellent. No focal filling defects are present to suggest pulmonary emboli. Pulmonary arteries are enlarged. Mediastinum/Nodes: No enlarged mediastinal, hilar, or axillary lymph nodes. Thyroid gland, trachea, and esophagus demonstrate no significant findings. Lungs/Pleura: The right main pulmonary artery measures up to 25 mm. Left main pulmonary artery measures up to 26 mm. Right middle lobe and right lower lobe pulmonary nodules on images 40 and 41 of series 6 are stable. Mild dependent airspace disease likely reflects atelectasis. Asymmetric dependent airspace disease is present at the left base. Some asymmetric airspace disease is noted posteriorly in the right upper lobe is well. No significant pleural effusion or pneumothorax is present. Musculoskeletal: No chest wall abnormality. No acute or significant osseous findings. Review of the MIP images confirms the above findings. CT ABDOMEN and PELVIS FINDINGS Hepatobiliary: No focal  liver abnormality is seen. Status post cholecystectomy. No biliary dilatation. Pancreas: Unremarkable. No pancreatic ductal dilatation or surrounding inflammatory changes. Spleen: Normal in size without focal abnormality. Adrenals/Urinary Tract: 2 simple cysts are present in the left kidney. The larger measures 10 mm. 5 mm nonobstructing stone is present in the interpolar region of the right kidney. A 7 mm nonobstructing stone is present near the upper pole of the left kidney. Two 5 mm stones are present in the upper and lower poles of the left kidney respectively. At least 2 other punctate nonobstructing stones are present near the upper pole of the left kidney. No solid mass lesion is present. Ureters are within normal limits bilaterally. No obstruction is present. The urinary bladder is within normal limits. Stomach/Bowel: The stomach and duodenum are within normal limits. Small bowel is unremarkable. Terminal ileum is normal. The appendix is visualized and within normal limits. The ascending and transverse colon are within normal limits. Diverticular changes are present the distal descending and sigmoid colon. No inflammatory changes are present to suggest diverticulitis. Vascular/Lymphatic: Atherosclerotic calcifications are present in the aorta and branch vessels. No aneurysm is present. Reproductive: 21 mm left adnexal cyst is stable. Uterus and adnexa are otherwise unremarkable. Other: Choose 1 Musculoskeletal: Left hemipelvectomy stable. Degenerative changes are noted at the right SI joint. No focal osseous lesions are present. Degenerative changes are present in the lower lumbar spine. Review of the MIP images confirms the above findings. IMPRESSION: 1. No pulmonary embolus. 2. Asymmetric airspace disease at the left base and posteriorly in the right upper lobe concerning for pneumonia. 3. Cardiomegaly with  enlargement of the main pulmonary arteries suggesting pulmonary arterial hypertension. 4. Stable  right middle lobe and right lower lobe pulmonary nodules. 5. Bilateral nonobstructing renal stones. 6. Descending and sigmoid diverticulosis without evidence for diverticulitis. 7. Stable 21 mm left adnexal cyst. No follow-up imaging recommended. Note: This recommendation does not apply to premenarchal patients and to those with increased risk (genetic, family history, elevated tumor markers or other high-risk factors) of ovarian cancer. Reference: JACR 2020 Feb; 17(2):248-254 8. Degenerative changes in the lower lumbar spine. 9. Aortic Atherosclerosis (ICD10-I70.0). Electronically Signed   By: San Morelle M.D.   On: 12/15/2021 12:17   CT ABDOMEN PELVIS W CONTRAST  Result Date: 12/15/2021 CLINICAL DATA:  Pulmonary embolism suspected, high probability. Abdominal pain, acute, nonlocalized. Hypoxia EXAM: CT ANGIOGRAPHY CHEST CT ABDOMEN AND PELVIS WITH CONTRAST TECHNIQUE: Multidetector CT imaging of the chest was performed using the standard protocol during bolus administration of intravenous contrast. Multiplanar CT image reconstructions and MIPs were obtained to evaluate the vascular anatomy. Multidetector CT imaging of the abdomen and pelvis was performed using the standard protocol during bolus administration of intravenous contrast. RADIATION DOSE REDUCTION: This exam was performed according to the departmental dose-optimization program which includes automated exposure control, adjustment of the mA and/or kV according to patient size and/or use of iterative reconstruction technique. CONTRAST:  48m OMNIPAQUE IOHEXOL 350 MG/ML SOLN COMPARISON:  CT chest with contrast 07/23/2019 scratched at CT of the chest, abdomen and pelvis with contrast 07/23/2019 FINDINGS: CTA CHEST FINDINGS Cardiovascular: Heart is enlarged. Atherosclerotic calcifications are present at the aortic arch and great vessel origins. New significant stenosis or aneurysm is present. Pulmonary artery opacification is excellent. No focal  filling defects are present to suggest pulmonary emboli. Pulmonary arteries are enlarged. Mediastinum/Nodes: No enlarged mediastinal, hilar, or axillary lymph nodes. Thyroid gland, trachea, and esophagus demonstrate no significant findings. Lungs/Pleura: The right main pulmonary artery measures up to 25 mm. Left main pulmonary artery measures up to 26 mm. Right middle lobe and right lower lobe pulmonary nodules on images 40 and 41 of series 6 are stable. Mild dependent airspace disease likely reflects atelectasis. Asymmetric dependent airspace disease is present at the left base. Some asymmetric airspace disease is noted posteriorly in the right upper lobe is well. No significant pleural effusion or pneumothorax is present. Musculoskeletal: No chest wall abnormality. No acute or significant osseous findings. Review of the MIP images confirms the above findings. CT ABDOMEN and PELVIS FINDINGS Hepatobiliary: No focal liver abnormality is seen. Status post cholecystectomy. No biliary dilatation. Pancreas: Unremarkable. No pancreatic ductal dilatation or surrounding inflammatory changes. Spleen: Normal in size without focal abnormality. Adrenals/Urinary Tract: 2 simple cysts are present in the left kidney. The larger measures 10 mm. 5 mm nonobstructing stone is present in the interpolar region of the right kidney. A 7 mm nonobstructing stone is present near the upper pole of the left kidney. Two 5 mm stones are present in the upper and lower poles of the left kidney respectively. At least 2 other punctate nonobstructing stones are present near the upper pole of the left kidney. No solid mass lesion is present. Ureters are within normal limits bilaterally. No obstruction is present. The urinary bladder is within normal limits. Stomach/Bowel: The stomach and duodenum are within normal limits. Small bowel is unremarkable. Terminal ileum is normal. The appendix is visualized and within normal limits. The ascending and  transverse colon are within normal limits. Diverticular changes are present the distal descending and sigmoid colon. No inflammatory  changes are present to suggest diverticulitis. Vascular/Lymphatic: Atherosclerotic calcifications are present in the aorta and branch vessels. No aneurysm is present. Reproductive: 21 mm left adnexal cyst is stable. Uterus and adnexa are otherwise unremarkable. Other: Choose 1 Musculoskeletal: Left hemipelvectomy stable. Degenerative changes are noted at the right SI joint. No focal osseous lesions are present. Degenerative changes are present in the lower lumbar spine. Review of the MIP images confirms the above findings. IMPRESSION: 1. No pulmonary embolus. 2. Asymmetric airspace disease at the left base and posteriorly in the right upper lobe concerning for pneumonia. 3. Cardiomegaly with enlargement of the main pulmonary arteries suggesting pulmonary arterial hypertension. 4. Stable right middle lobe and right lower lobe pulmonary nodules. 5. Bilateral nonobstructing renal stones. 6. Descending and sigmoid diverticulosis without evidence for diverticulitis. 7. Stable 21 mm left adnexal cyst. No follow-up imaging recommended. Note: This recommendation does not apply to premenarchal patients and to those with increased risk (genetic, family history, elevated tumor markers or other high-risk factors) of ovarian cancer. Reference: JACR 2020 Feb; 17(2):248-254 8. Degenerative changes in the lower lumbar spine. 9. Aortic Atherosclerosis (ICD10-I70.0). Electronically Signed   By: San Morelle M.D.   On: 12/15/2021 12:17   DG Chest Port 1 View  Result Date: 12/15/2021 CLINICAL DATA:  88 year old female with possible sepsis. EXAM: PORTABLE CHEST 1 VIEW COMPARISON:  Chest x-ray 10/02/2017. FINDINGS: Lung volumes are low. Blunting of the left costophrenic sulcus. Opacity at the left base which may reflect atelectasis and/or consolidation. Diffuse interstitial prominence and  peribronchial cuffing, concerning for an acute bronchitis. No pneumothorax. Heart size is upper limits of normal. The patient is rotated to the left on today's exam, resulting in distortion of the mediastinal contours and reduced diagnostic sensitivity and specificity for mediastinal pathology. IMPRESSION: 1. Findings are concerning for severe acute bronchitis, likely with developing left lower lobe bronchopneumonia and small parapneumonic pleural effusion. Followup PA and lateral chest X-ray is recommended in 3-4 weeks following trial of antibiotic therapy to ensure resolution and exclude underlying malignancy. Electronically Signed   By: Vinnie Langton M.D.   On: 12/15/2021 10:29      Assessment/Plan Principal Problem:   HCAP (healthcare-associated pneumonia) Active Problems:   Acute respiratory failure with hypoxia (HCC)   Severe sepsis (HCC)   Type 2 MI (myocardial infarction) ([place])   DVT (deep venous thrombosis) (HCC)   Controlled type 2 diabetes mellitus without complication, without long-term current use of insulin ([place])   Hypertension   Breast cancer of upper-outer quadrant of left female breast ([place])   Obesity with body mass index of 30.0-39.9   Depression   Assessment and Plan: * HCAP (healthcare-associated pneumonia) Acute respiratory failure with hypoxia and severe sepsis due to HCAP: Patient has 6 L new oxygen requirement.  Patient meets criteria for severe sepsis with WBC 16.7, heart rate 111, RR 25, lactic acid 2.4.  Patient also reports sore throat, will get rapid strep PCR.  -Admited to progressive unit as inpatient - IV Vancomycin and cefepime (patient also received 1 dose of azithromycin in ED) - Mucinex for cough  - Bronchodilators - Urine legionella and S. pneumococcal antigen - Follow up blood culture x2, sputum culture  - will get Procalcitonin and trend lactic acid level per sepsis protocol - IVF: 3L of LR bolus in ED, followed by 75 mL per hour of NS  - f/u  Rapid strep PCR - check VBG     Acute respiratory failure with hypoxia (HCC) -see above -check BNP  to r/o fluid overload  Severe sepsis (HCC) -see above  Type 2 MI (myocardial infarction) ([place]) Type 2 MI due to severe sepsis, hypoxia and HCAP: Troponin 475, no chest pain. -Trend troponin -Aspirin -Patient is allergic to Lipitor, will not start statin -Check A1c, FLP  DVT (deep venous thrombosis) (HCC) -on Pradaxa  Controlled type 2 diabetes mellitus without complication, without long-term current use of insulin (HCC) Recent A1c 6.6, well controlled.  Patient is taking metformin and glipizide. -Sliding scale insulin  Hypertension - IV hydralazine as needed -Hold home Cozaar since patient at high risk of developing hypotension due to severe sepsis -Continue metoprolol  Breast cancer of upper-outer quadrant of left female breast (HCC) S/p of radiation therapy -Letrozole  Obesity with body mass index of 30.0-39.9  BMI= 34.57  and BW= 80.3 -Diet and exercise.   -Encourage to lose weight.   Depression - Continue home medications          DVT ppx: on Pradaxa   Code Status:  DNR (pt has DNR form from SNF)  Family Communication:   Yes, patient's daughter by phone  Disposition Plan:  Anticipate discharge back to previous environment, SNF  Consults called:  none  Admission status and Level of care: Progressive:   as inpt    Severity of Illness:  The appropriate patient status for this patient is INPATIENT. Inpatient status is judged to be reasonable and necessary in order to provide the required intensity of service to ensure the patient's safety. The patient's presenting symptoms, physical exam findings, and initial radiographic and laboratory data in the context of their chronic comorbidities is felt to place them at high risk for further clinical deterioration. Furthermore, it is not anticipated that the patient will be medically stable for discharge from  the hospital within 2 midnights of admission.   * I certify that at the point of admission it is my clinical judgment that the patient will require inpatient hospital care spanning beyond 2 midnights from the point of admission due to high intensity of service, high risk for further deterioration and high frequency of surveillance required.*       Date of Service 12/15/2021    Ivor Costa [facility]   If 7PM-7AM, please contact night-coverage [email or web address] 12/15/2021, 2:29 PM

## 2021-12-15 NOTE — Assessment & Plan Note (Addendum)
  BMI= 34.57  and BW= 70.3 Complicates overall prognosis and care Lifestyle modification and exercise discussed with patient

## 2021-12-15 NOTE — Assessment & Plan Note (Addendum)
Acute respiratory failure with hypoxia and severe sepsis due to HCAP:  Patient has a new oxygen requirement and is currently on 6 L to maintain pulse oximetry greater than 92%. She is less tachypneic and has reduced work of breathing compared to admission Continue to wean off oxygen as tolerated

## 2021-12-15 NOTE — Assessment & Plan Note (Addendum)
Stable Continue Pradaxa

## 2021-12-15 NOTE — ED Provider Notes (Signed)
[facility] Provider Note    Event Date/Time   First MD Initiated Contact with Patient 12/15/21 1010     (approximate)   History   Shortness of Breath   HPI  Julie Acosta is a 88 y.o. 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  She has felt nauseous but is not having abdominal pain is not vomiting.  Denies shortness of breath or cough.  Denies urinary symptoms denies chest pain.  Has had decreased appetite and has not eaten dinner for the last 3 nights.  She denies any new extremity pain she is status post amputation of the left many years ago.  She is relatively bedbound.    Past Medical History:  Diagnosis Date   Anxiety    Benign breast lumps    Blood clotting disorder (HCC)    Bone cancer (HCC)    head of femur, left leg ... amputation at age 69   Breast cancer of upper-outer quadrant of left female breast ([place]) 07/27/2017   11 mm invasive mammary carcinoma, ER 90%, PR 51-90%, negative margins.  Oncotype recurrence score: 1.  DCIS, margin less than 0.5 mm.  Negative sentinel node.  Accelerated partial breast radiation.   Cervicalgia    Chronic kidney disease    kidney stones   Colon cancer [facility])    patient unaware of this   Complication of anesthesia    mood alteration / not sure if d/t pain medicine or anesthesia as she passed out    Cough    NASAL DRIP / SNEEZING / SORE THROAT MOSTLY CONSTANT   Depression    Diabetes ([place])    Diverticulitis    Dizzy    GERD (gastroesophageal reflux disease)    H/O blood clots    arm and leg   HBP (high blood pressure)    Hematuria    gross   Hemorrhoids    HLD (hyperlipidemia)    Muscle pain    Osteoarthritis    Osteoporosis    Panic disorder    Personal history of radiation therapy    PND (post-nasal drip)    CHRONIC WITH  SORE THROAT AND SNEEZING   Reflux    Skin cancer    nose   Swelling    Tremor    Tremors of nervous system     Patient Active Problem List   Diagnosis Date Noted   PAD (peripheral artery disease) ([place]) 07/25/2020   Sepsis ([place])    Splenic infarct 07/23/2019   Coagulation disorder ([place]) 05/30/2019   Ureteral stone with hydronephrosis 11/26/2018   Pain due to onychomycosis of toenail of right foot 10/18/2018   S/P bilateral below knee amputation ([place]) 10/18/2018   Primary cancer of upper outer quadrant of left female breast ([place]) 06/12/2017   Diarrhea 01/16/2017   Hematuria 01/12/2017   Chronic diarrhea 01/12/2017   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   Right leg swelling 10/13/2016   Microscopic hematuria 06/02/2015   Benign breast lumps 05/30/2015   Cancer ([place]) 05/30/2015   Depression 05/30/2015   Controlled type 2 diabetes mellitus without complication, without long-term current use of insulin ([place]) 05/30/2015   Hyperlipidemia, unspecified 05/30/2015   Hypertension 05/30/2015   Osteoporosis, post-menopausal 05/30/2015   Panic attacks 05/30/2015     Physical Exam  Triage Vital Signs: ED Triage Vitals  Enc Vitals  Group     BP 12/15/21 1007 (!) 109/54     Pulse Rate 12/15/21 1007 (!) 107     Resp 12/15/21 1007 (!) 24     Temp 12/15/21 1007 99.4 F (37.4 C)     Temp Source 12/15/21 1007 Oral     SpO2 12/15/21 1007 92 %     Weight 12/15/21 1008 177 lb (80.3 kg)     Height --      Head Circumference --      Peak Flow --      Pain Score 12/15/21 1007 0     Pain Loc --      Pain Edu? --      Excl. in [place]? --     Most recent vital signs: Vitals:   12/15/21 1102 12/15/21 1130  BP: (!) 107/49 (!) 111/59  Pulse: (!) 108 (!) 106  Resp: (!) 25 18  Temp:  98.9 F (37.2 C)  SpO2: 92% 92%     General: Awake, no distress.  Patient is unwell appearing CV:  Good peripheral perfusion.  Right lower extremities without edema Resp:  Normal effort.  Mild increased work  of breathing lungs sound clear in anterior lung fields Abd:  No distention.  Abdomen is mildly tender throughout Neuro:             Awake, Alert, oriented to person and place but thinks it is 1932 Other:     ED Results / Procedures / Treatments  Labs (all labs ordered are listed, but only abnormal results are displayed) Labs Reviewed  LACTIC ACID, PLASMA - Abnormal; Notable for the following components:      Result Value   Lactic Acid, Venous 2.4 (*)    All other components within normal limits  COMPREHENSIVE METABOLIC PANEL - Abnormal; Notable for the following components:   Sodium 132 (*)    CO2 20 (*)    Glucose, Bld 332 (*)    BUN 25 (*)    Creatinine, Ser 1.04 (*)    Albumin 3.3 (*)    GFR, Estimated 53 (*)    All other components within normal limits  CBC WITH DIFFERENTIAL/PLATELET - Abnormal; Notable for the following components:   WBC 16.7 (*)    Hemoglobin 11.7 (*)    MCH 24.4 (*)    MCHC 29.5 (*)    RDW 18.0 (*)    Neutro Abs 14.4 (*)    Monocytes Absolute 1.1 (*)    Abs Immature Granulocytes 0.08 (*)    All other components within normal limits  PROTIME-INR - Abnormal; Notable for the following components:   Prothrombin Time 16.0 (*)    INR 1.3 (*)    All other components within normal limits  TROPONIN I (HIGH SENSITIVITY) - Abnormal; Notable for the following components:   Troponin I (High Sensitivity) 475 (*)    All other components within normal limits  SARS CORONAVIRUS 2 BY RT PCR  CULTURE, BLOOD (ROUTINE X 2)  CULTURE, BLOOD (ROUTINE X 2)  URINE CULTURE  APTT  LIPASE, BLOOD  LACTIC ACID, PLASMA  URINALYSIS, COMPLETE (UACMP) WITH MICROSCOPIC  TROPONIN I (HIGH SENSITIVITY)     EKG  EKG reviewed and interpreted by myself shows sinus tachycardia with a right bundle branch block and left anterior fascicular block left axis deviation no acute schema changes but   RADIOLOGY I reviewed the chest x-ray and interpreted which shows likely right lower  lobe pneumonia   PROCEDURES:  Critical Care performed:  Yes, see critical care procedure note(s)  .Critical Care  Performed by: Rada Hay, MD Authorized by: Rada Hay, MD   Critical care provider statement:    Critical care time (minutes):  45   Critical care was time spent personally by me on the following activities:  Development of treatment plan with patient or surrogate, discussions with consultants, evaluation of patient's response to treatment, examination of patient, ordering and review of laboratory studies, ordering and review of radiographic studies, ordering and performing treatments and interventions, pulse oximetry, re-evaluation of patient's condition and review of old charts   The patient is on the cardiac monitor to evaluate for evidence of arrhythmia and/or significant heart rate changes.   MEDICATIONS ORDERED IN ED: Medications  vancomycin (VANCOREADY) IVPB 2000 mg/400 mL (has no administration in time range)  azithromycin (ZITHROMAX) 500 mg in sodium chloride 0.9 % 250 mL IVPB (has no administration in time range)  lactated ringers bolus 1,000 mL (has no administration in time range)  lactated ringers bolus 1,000 mL (0 mLs Intravenous Stopped 12/15/21 1133)  ceFEPIme (MAXIPIME) 2 g in sodium chloride 0.9 % 100 mL IVPB (0 g Intravenous Stopped 12/15/21 1240)  iohexol (OMNIPAQUE) 350 MG/ML injection 75 mL (75 mLs Intravenous Contrast Given 12/15/21 1138)     IMPRESSION / MDM / ASSESSMENT AND PLAN / ED COURSE  I reviewed the triage vital signs and the nursing notes.                              Patient's presentation is most consistent with acute presentation with potential threat to life or bodily function.  Differential diagnosis includes, but is not limited to, sepsis secondary to viral illness, pneumonia, intra-abdominal infection, pulmonary embolism  The patient is an 88 year old female with prior history of sarcoma status post left  hemicolectomy, splenic infarct on Pradaxa who presents with hypoxia and generally feeling unwell the last several days, poor appetite and nausea.  She was found to be hypoxic with sats in the low 80s at her nursing facility.  EMS placed her on 4 L nasal cannula.  On arrival to ED blood pressure soft she is mildly tachycardic and is satting 78% on room air with good [place].  Placed on 4 L nasal cannula with sats in the low 90s.  She denies shortness of breath or cough denies chest pain.  Her main complaint is some nausea and decreased appetite just feeling sick.  No fevers or chills no urinary symptoms.  X-ray looks to be consistent with a left lower lobe pneumonia possible parapneumonic effusion.  Patient does have factor V Leiden deficiency although she is on Pradaxa and concern for pulmonary embolism as well given the degree of hypoxia.  Will obtain CTA to evaluate the lungs and to rule out PE as well as a CT of her abdomen pelvis because on exam she did have some diffuse tenderness.  She is a leukocytosis to 16 lactate of 2.4.  She is getting IV fluids will cover broadly with cefepime and vancomycin.  Anticipate admission.  Patient's chest x-ray is concerning for pneumonia.  CT a does not have any evidence of pulmonary embolism there is in the left base and right upper lung fields concerning for pneumonia.  In addition to cefepime bank advised on azithromycin.  On reassessment patient is satting in the mid 80s on 6 L nasal cannula now.  I have asked respiratory to put her  on high flow nasal cannula.  She is also tachycardia to the 120s.  She is maintaining her mental status but is somewhat more dyspneic.     FINAL CLINICAL IMPRESSION(S) / ED DIAGNOSES   Final diagnoses:  Hypoxia  Community acquired pneumonia, unspecified laterality  Sepsis, due to unspecified organism, unspecified whether acute organ dysfunction present [facility])     Rx / DC Orders   ED Discharge Orders     None        Note:   This document was prepared using Dragon voice recognition software and may include unintentional dictation errors.   Rada Hay, MD 12/15/21 1245

## 2021-12-15 NOTE — Progress Notes (Signed)
Pharmacy Antibiotic Note  KA BENCH is a 88 y.o. 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  Pharmacy has been consulted for vancomycin and cefepime dosing.  Plan:  1) start cefepime 2 grams IV every 12 hours  2) start vancomycin 750 mg IV Q 36 hrs (2000 mg administered in the ED) Goal AUC 400-550. Expected AUC: 463.0 SCr used: 1.09 mg/dL Ke 0.027 h-1, T1/2 25.8 MRSA PCR pending   Weight: 80.3 kg (177 lb)  Temp (24hrs), Avg:98.8 F (37.1 C), Min:98.2 F (36.8 C), Max:99.4 F (37.4 C)  Recent Labs  Lab 12/15/21 1011 12/15/21 1319  WBC 16.7*  --   CREATININE 1.04*  --   LATICACIDVEN 2.4* 2.4*    CrCl cannot be calculated (Unknown ideal weight.).    Allergies  Allergen Reactions   Ciprofloxacin Hives   Codeine Other (See Comments)    HALLUCINATIONS   Tape     Rash and skin irritation/ paper tape and tegaderm OK   Ciprocinonide [Fluocinolone] Other (See Comments)    unknown   Amoxicillin Other (See Comments)    Upset stomach Has patient had a PCN reaction causing immediate rash, facial/tongue/throat swelling, SOB or lightheadedness with hypotension: No Has patient had a PCN reaction causing severe rash involving mucus membranes or skin necrosis: No Has patient had a PCN reaction that required hospitalization: No Has patient had a PCN reaction occurring within the last 10 years: Yes If all of the above answers are "NO", then may proceed with Cephalosporin use.;   Cefuroxime Other (See Comments)    OK INTRACAMERALLY PER DR WLP, upset stomach   Latex Rash    Rast test NEGATIVE   Lipitor [Atorvastatin] Other (See Comments)    unknown    Antimicrobials this admission: 08/13 vancomycin >>  08/13 cefepime >>   Microbiology results: 08/13 BCx: pending 08/13 UCx: pending  08/13  MRSA PCR pending  Thank you for allowing pharmacy to be a part of this patient's care.  Dallie Piles 12/15/2021 2:44 PM

## 2021-12-15 NOTE — ED Notes (Signed)
Pt to ED for low SpO2 per [facility]. Pt does not normally wear O2. Pt is currently on O2 at this time at 4 LMP. Pt denies shortness of breath

## 2021-12-15 NOTE — ED Notes (Signed)
Pt was noted to have decreased SPO2. Upon going into the pt's room, it was noted that the pt was laying completely flat and that the oxygen was not in her nose. Repositioned the pt and oxygen.

## 2021-12-16 ENCOUNTER — Inpatient Hospital Stay
Admit: 2021-12-16 | Discharge: 2021-12-16 | Disposition: A | Discharge status: Home / Self Care | Payer: Medicare Other | Attending: Internal Medicine | Admitting: Internal Medicine

## 2021-12-16 DIAGNOSIS — J189 Pneumonia, unspecified organism: Secondary | ICD-10-CM | POA: Diagnosis not present

## 2021-12-16 LAB — MRSA NEXT GEN BY PCR, NASAL: MRSA by PCR Next Gen: NOT DETECTED

## 2021-12-16 LAB — URINALYSIS, COMPLETE (UACMP) WITH MICROSCOPIC
Bilirubin Urine: NEGATIVE
Glucose, UA: 50 mg/dL — AB
Ketones, ur: NEGATIVE mg/dL
Nitrite: NEGATIVE
Protein, ur: 100 mg/dL — AB
Specific Gravity, Urine: 1.032 — ABNORMAL HIGH (ref 1.005–1.030)
WBC, UA: >50 WBC/hpf — ABNORMAL HIGH (ref 0–5)
pH: 6 (ref 5.0–8.0)

## 2021-12-16 LAB — BASIC METABOLIC PANEL
Anion gap: 6 (ref 5–15)
BUN: 27 mg/dL — ABNORMAL HIGH (ref 8–23)
CO2: 22 mmol/L (ref 22–32)
Calcium: 9.5 mg/dL (ref 8.9–10.3)
Chloride: 107 mmol/L (ref 98–111)
Creatinine, Ser: 0.72 mg/dL (ref 0.44–1.00)
GFR, Estimated: >60 mL/min (ref >60)
Glucose, Bld: 194 mg/dL — ABNORMAL HIGH (ref 70–99)
Potassium: 3.3 mmol/L — ABNORMAL LOW (ref 3.5–5.1)
Sodium: 135 mmol/L (ref 135–145)

## 2021-12-16 LAB — CBC
HCT: 34.8 % — ABNORMAL LOW (ref 36.0–46.0)
Hemoglobin: 10.5 g/dL — ABNORMAL LOW (ref 12.0–15.0)
MCH: 24.6 pg — ABNORMAL LOW (ref 26.0–34.0)
MCHC: 30.2 g/dL (ref 30.0–36.0)
MCV: 81.7 fL (ref 80.0–100.0)
Platelets: 127 10*3/uL — ABNORMAL LOW (ref 150–400)
RBC: 4.26 MIL/uL (ref 3.87–5.11)
RDW: 18 % — ABNORMAL HIGH (ref 11.5–15.5)
WBC: 12.3 10*3/uL — ABNORMAL HIGH (ref 4.0–10.5)
nRBC: 0 % (ref 0.0–0.2)

## 2021-12-16 LAB — STREP PNEUMONIAE URINARY ANTIGEN: Strep Pneumo Urinary Antigen: NEGATIVE

## 2021-12-16 MED ORDER — DEXTROSE 5 % IV SOLN
250.0000 mg | INTRAVENOUS | Status: DC
  Administered 2021-12-16 – 2021-12-17 (×2): 250 mg via INTRAVENOUS
  Filled 2021-12-16 (×3): qty 2.5

## 2021-12-16 MED ORDER — POTASSIUM CHLORIDE CRYS ER 20 MEQ PO TBCR
40.0000 meq | EXTENDED_RELEASE_TABLET | Freq: Once | ORAL | Status: AC
Start: 2021-12-16 — End: 2021-12-16
  Administered 2021-12-16: 40 meq via ORAL
  Filled 2021-12-16: qty 2

## 2021-12-16 NOTE — Progress Notes (Signed)
Progress Note   Patient: Julie Acosta YQM:[id number] DOB: 04-19-1934 DOA: 12/15/2021     1 DOS: the patient was seen and examined on 12/16/2021   Brief hospital course: No notes on file Julie Acosta is a 88 y.o. 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   Patient stated she has generalized weakness in the past several days, which has been progressively worsening.  He has nausea, denies vomiting, diarrhea or abdominal pain. Patient is normally not using oxygen, but she was found to have oxygen desaturation to 78% on room air in ED, which improved to 92-93% on 6 L oxygen however, patient denies cough, shortness breath and chest pain.  No fever or chills.  Denies symptoms of UTI.  No unilateral numbness or tinglings in extremities.  No facial droop or slurred speech.  She has poor appetite and decreased oral intake.  She reports sore throat.   Data reviewed independently and ED Course: pt was found to have WBC 16.7, negative COVID PCR, lactic acid 2.4, INR 1.3, PTT 31, troponin level 475, mild AKI with creatinine 1.04, BUN 25, GFR 53.  Liver function normal, lipase 22.  Temperature normal, blood pressure 115/60, heart rate 111, RR 25.  Chest x-ray showed possible left lower lobe infiltration.  CTA negative for PE, showed infiltration in left base and the right upper lobe.  Patient is admitted to PCU as inpatient Assessment and Plan: * HCAP (healthcare-associated pneumonia) Acute respiratory failure with hypoxia and severe sepsis due to HCAP:  Patient has a new oxygen requirement and is currently on 6 L to maintain pulse oximetry greater than 92%. She is less tachypneic and has reduced work of  breathing compared to admission Continue to wean off oxygen as tolerated        Acute respiratory failure with hypoxia ([place]) Secondary to healthcare associated pneumonia. Patient is currently on 6 L of oxygen We will attempt to wean off oxygen as tolerated  Severe sepsis (HCC) Appears to be multifactorial and secondary to healthcare associated pneumonia as well as a UTI CT angio shows asymmetric airspace disease at the left base and posteriorly in the right upper lobe concerning for pneumonia Patient also has pyuria Blood cultures yielded E. Coli Continue empiric antibiotic therapy with Rocephin and azithromycin Follow-up results of blood and urine culture  Type 2 MI (myocardial infarction) (HCC) Type 2 MI, Most likely secondary to demand ischemia from severe sepsis and hypoxia Troponin 475, no chest pain. Continue aspirin and metoprolol 2D echocardiogram to assess LVEF and rule out regional wall motion abnormality Cardiology consult  DVT (deep venous thrombosis) (HCC) Stable Continue Pradaxa  Controlled type 2 diabetes mellitus without complication, without long-term current use of insulin (HCC) Recent A1c 6.6, well controlled.  Patient is taking metformin and glipizide. -Sliding scale insulin  Hypertension - IV hydralazine as needed -Hold home Cozaar since patient at high risk of developing hypotension due to severe sepsis -Continue metoprolol  Breast cancer of upper-outer quadrant of left female breast (HCC) S/p of radiation therapy Continue letrozole  Obesity with body mass index of 30.0-39.9  BMI= 34.57  and BW= 52.8 Complicates overall prognosis and care Lifestyle modification and exercise discussed with patient   Depression Stable Continue Paxil        Subjective: Patient  is seen and examined at bedside.  Physical Exam: Vitals:   12/15/21 1948 12/15/21 2307 12/16/21 0516 12/16/21 0825  BP: (!) 113/51 (!) 122/53 (!) 123/58 (!) 110/47  Pulse:  (!) 123 (!) 117 (!) 108 (!) 104  Resp: '20 20 18 20  '$ Temp: 98.7 F (37.1 C) 98.1 F (36.7 C) 97.8 F (36.6 C) 99.3 F (37.4 C)  TempSrc: Oral  Oral   SpO2: 94% 94% 95% 96%  Weight:       Physical Exam General: Not in acute distress HEENT:       Eyes: PERRL, EOMI, no scleral icterus.       ENT: No discharge from the ears and nose, has pharynx injection, no tonsillar enlargement.        Neck: No JVD, no bruit, no mass felt. Heme: No neck lymph node enlargement. Cardiac: S1/S2, RRR, No murmurs, No gallops or rubs. Respiratory: has fine crackles bilaterally GI: Soft, nondistended, nontender, no rebound pain, no organomegaly, BS present. GU: No hematuria Ext: No pitting leg edema bilaterally. 1+DP/PT pulse in right leg. S/p of left leg amputation Musculoskeletal: No joint deformities, No joint redness or warmth, no limitation of ROM in spin. Skin: No rashes.  Neuro: Alert, oriented X3, cranial nerves II-XII grossly intact, moves all extremities. Psych: Patient is not psychotic, no suicidal or hemocidal ideation. Data Reviewed: Relevant notes from primary care and specialist visits, past discharge summaries as available in EHR, including Care Everywhere. Prior diagnostic testing as pertinent to current admission diagnoses Updated medications and problem lists for reconciliation ED course, including vitals, labs, imaging, treatment and response to treatment Triage notes, nursing and pharmacy notes and ED provider's notes Notable results as noted in HPI Labs reviewed.  Blood cultures yielded E. Coli Urine and Alysis shows pyuria Noted to have hypokalemia that has been supplemented Leukocytosis persists but shows a downward trend There are no new results to review at this time.  Family Communication: Greater than 50% of time was spent discussing plan of care with patient at the bedside.  Disposition: Status is: Inpatient Remains inpatient appropriate because: She continues to have  high oxygen requirement and is on 6 L to maintain pulse oximetry greater than 92%.  Remains on IV antibiotic therapy for healthcare associated pneumonia and UTI  Planned Discharge Destination: Skilled nursing facility    Time spent: 35 minutes  Author: Collier Bullock, MD 12/16/2021 3:58 PM  For on call review [email or web address].

## 2021-12-16 NOTE — Inpatient Diabetes Management (Signed)
Inpatient Diabetes Program Recommendations  AACE/ADA: New Consensus Statement on Inpatient Glycemic Control (2015)  Target Ranges:  Prepandial:   less than 140 mg/dL      Peak postprandial:   less than 180 mg/dL (1-2 hours)      Critically ill patients:  140 - 180 mg/dL    Latest Reference Range & Units 12/15/21 10:11 12/16/21 05:29  Glucose 70 - 99 mg/dL 332 (H) 194 (H)  (H): Data is abnormally high  Latest Reference Range & Units 12/15/21 15:45  Hemoglobin A1C 4.8 - 5.6 % 7.1 (H)  (H): Data is abnormally high    Admit with:  HCAP (healthcare-associated pneumonia) Acute respiratory failure with hypoxia and severe sepsis due to HCAP  History: DM2  SNF DM Meds: Glipizide 2.5 mg daily     Metformin 500 mg daily  Current Orders: None yet     MD- Note pt with History of DM2.  Takes Glipizide and Metformin at SNF.  No CBGs ordered in hospital yet.  Please order CBG checks and Novolog Sensitive Correction Scale/ SSI (0-9 units) TID AC + HS    --Will follow patient during hospitalization--  Wyn Quaker RN, MSN, [place] Diabetes Coordinator Inpatient Glycemic Control Team Team Pager: [phone] (8a-5p)

## 2021-12-16 NOTE — Progress Notes (Signed)
Pharmacy Antibiotic Note  Julie Acosta is a 88 y.o. 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  Pharmacy has been consulted for vancomycin and ceftriaxone dosing.  Plan: Patient given vancomycin 2000 mg once.  Start vancomycin 1000 mg IV Q36 hours.  Goal AUC 400-550. Expected AUC: 473.7. Scr used: 0.8 (0.72 on 8/14) Ke 0.035 h-1, T1/2 19.8 MRSA PCR pending  Check vancomycin levels after 4th or 5th dose   Discontinue cefepime 2 grams IV every 12 hours. Initiate ceftriaxone 2 grams every 24 hours.   Pharmacy will continue to follow and will adjust abx dosing whenever warranted.    Weight: 80.3 kg (177 lb)  Temp (24hrs), Avg:98.8 F (37.1 C), Min:97.8 F (36.6 C), Max:99.8 F (37.7 C)  Recent Labs  Lab 12/15/21 1011 12/15/21 1319 12/15/21 1545 12/15/21 2019 12/16/21 0529  WBC 16.7*  --  15.3*  --  12.3*  CREATININE 1.04*  --   --   --  0.72  LATICACIDVEN 2.4* 2.4* 2.3* 1.9  --      CrCl cannot be calculated (Unknown ideal weight.).    Allergies  Allergen Reactions   Ciprofloxacin Hives   Codeine Other (See Comments)    HALLUCINATIONS   Tape     Rash and skin irritation/ paper tape and tegaderm OK   Ciprocinonide [Fluocinolone] Other (See Comments)    unknown   Amoxicillin Other (See Comments)    Upset stomach Has patient had a PCN reaction causing immediate rash, facial/tongue/throat swelling, SOB or lightheadedness with hypotension: No Has patient had a PCN reaction causing severe rash involving mucus membranes or skin necrosis: No Has patient had a PCN reaction that required hospitalization: No Has patient had a PCN reaction occurring within the last 10 years: Yes If all of the above answers are "NO", then may proceed with Cephalosporin use.;   Cefuroxime Other (See Comments)     OK INTRACAMERALLY PER DR WLP, upset stomach   Latex Rash    Rast test NEGATIVE   Lipitor [Atorvastatin] Other (See Comments)    unknown    Antimicrobials this admission: 08/13 vancomycin >>  08/13 cefepime >>   Microbiology results: 08/13 BCx: e. Coli (2 of 4 bottles per BCID) 08/13 UCx: pending  08/13 MRSA PCR pending  Thank you for allowing pharmacy to be a part of this patient's care.  Venora Maples, PharmD Candidate 2025 12/16/2021 9:53 AM

## 2021-12-16 NOTE — TOC Initial Note (Signed)
Transition of Care [facility]) - Initial/Assessment Note    Patient Details  Name: Julie Acosta MRN: [id number] Date of Birth: [birth year]  Transition of Care [facility]) CM/SW Contact:    Candie Chroman, LCSW Phone Number: 12/16/2021, 1:52 PM  Clinical Narrative:  Per chart review, patient was admitted from [facility]. Admissions coordinator confirmed she is a long-term resident on their SNF side. Will continue to follow progress and facilitate return once medically stable.                Expected Discharge Plan: Skilled Nursing Facility Barriers to Discharge: Continued Medical Work up   Patient Goals and CMS Choice     Choice offered to / list presented to : NA  Expected Discharge Plan and Services Expected Discharge Plan: [place] Acute Care Choice: Resumption of Svcs/PTA Provider Living arrangements for the past 2 months: [place]                                      Prior Living Arrangements/Services Living arrangements for the past 2 months: [place] Lives with:: Facility Resident Patient language and need for interpreter reviewed:: Yes        Need for Family Participation in Patient Care: Yes (Comment) Care giver support system in place?: Yes (comment)   Criminal Activity/Legal Involvement Pertinent to Current Situation/Hospitalization: No - Comment as needed  Activities of Daily Living Home Assistive Devices/Equipment: Wheelchair ADL Screening (condition at time of admission) Patient's cognitive ability adequate to safely complete daily activities?: Yes Is the patient deaf or have difficulty hearing?: No Does the patient have difficulty seeing, even when wearing glasses/contacts?: No Does the patient have difficulty concentrating, remembering, or making decisions?: Yes Patient able to express need for assistance with ADLs?: Yes Does the patient have difficulty dressing or bathing?: Yes Independently  performs ADLs?: No Dressing (OT): Dependent Is this a change from baseline?: Pre-admission baseline Grooming: Dependent Is this a change from baseline?: Pre-admission baseline Feeding: Independent Bathing: Dependent Is this a change from baseline?: Pre-admission baseline Toileting: Dependent Is this a change from baseline?: Pre-admission baseline In/Out Bed: Dependent Is this a change from baseline?: Pre-admission baseline Walks in Home: Dependent Is this a change from baseline?: Pre-admission baseline Does the patient have difficulty walking or climbing stairs?: Yes Weakness of Legs: Right Weakness of Arms/Hands: Both  Permission Sought/Granted Permission sought to share information with : Investment banker, corporate granted to share info w AGENCY: [facility]        Emotional Assessment       Orientation: : Oriented to Self, Oriented to Place, Oriented to Situation Alcohol / Substance Use: Not Applicable Psych Involvement: No (comment)  Admission diagnosis:  Hypoxia [R09.02] HCAP (healthcare-associated pneumonia) [J18.9] Community acquired pneumonia, unspecified laterality [J18.9] Sepsis, due to unspecified organism, unspecified whether acute organ dysfunction present [facility]) [A41.9] Patient Active Problem List   Diagnosis Date Noted   HCAP (healthcare-associated pneumonia) 12/15/2021   Type 2 MI (myocardial infarction) ([place]) 12/15/2021   Severe sepsis ([place]) 12/15/2021   Acute respiratory failure with hypoxia ([place]) 12/15/2021   PAD (peripheral artery disease) ([place]) 07/25/2020   Sepsis ([place])    Splenic infarct 07/23/2019   Coagulation disorder ([place]) 05/30/2019   Ureteral stone with hydronephrosis 11/26/2018   Pain due to onychomycosis of toenail of right foot  10/18/2018   Breast cancer of upper-outer quadrant of left female breast ([place]) 07/27/2017   Obesity with body mass index of 30.0-39.9 07/27/2017   DVT (deep venous thrombosis) ([place])  07/27/2017   Primary cancer of upper outer quadrant of left female breast ([place]) 06/12/2017   Diarrhea 01/16/2017   Hematuria 01/12/2017   Chronic diarrhea 01/12/2017   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   Right leg swelling 10/13/2016   Microscopic hematuria 06/02/2015   Benign breast lumps 05/30/2015   Cancer ([place]) 05/30/2015   Depression 05/30/2015   Controlled type 2 diabetes mellitus without complication, without long-term current use of insulin ([place]) 05/30/2015   Hyperlipidemia, unspecified 05/30/2015   Hypertension 05/30/2015   Osteoporosis, 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

## 2021-12-16 NOTE — Consult Note (Signed)
[facility] CLINIC CARDIOLOGY CONSULT NOTE       Patient ID: Julie Acosta MRN: [id number] DOB/AGE: 86-Jan-1935 86 y.o.  Admit date: 12/15/2021 Referring Physician Dr. Joylene Igo Primary Physician Dr. 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  She was hypoxic to 78%, on room air, requiring 6 L of supplemental oxygen.  CTA chest negative for PE but concerning for pneumonia.  She met sepsis criteria with leukocytosis to 16, tachycardia to the 110s, and elevated lactate.  Cardiology is consulted due to her elevated troponin.  The patient states she had not been eating well for the past several days and was slightly nauseous and has had some intermittent diarrhea.  She just says she "does not feel good" and was told that her blood pressure and oxygen were was low when the nurse took it at [facility] yesterday and took her to the ED.  She does not feel short of breath, denies chest pain, palpitations, dizziness, lower extremity edema, or presyncope.  Denies vomiting or significant cough.  She was hypoxic to 78% on room air, requiring 6 L of oxygen with none at baseline, she was tachycardic to the 110s, lactate elevated to 2.4, and leukocytosis to 16.  Procalcitonin 1.17.  UA resulted with large leukocytes, many bacteria, and greater than 50 WBCs as well.  CTA chest was negative for PE but was concerning for pneumonia.  Chest x-ray concerning for severe bronchitis and developing left lower lobe pneumonia.  A troponin was checked which was elevated to 475 and downtrending [phone] thereafter. EKG showed sinus tachycardia with a new RBBB,  but no other acute ST or T wave changes.   She has never been seen by a cardiologist before, but notes a family history of heart attack in her father.  Review of systems complete and found to be negative unless listed above     Past Medical History:  Diagnosis Date   Anxiety    Benign breast lumps    Blood clotting disorder (HCC)    Bone cancer (HCC)    head of femur, left leg ... 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  Oncotype recurrence score: 1.  DCIS, margin less than 0.5 mm.  Negative sentinel node.  Accelerated partial breast radiation.   Cervicalgia    Chronic kidney disease    kidney stones   Colon cancer [facility])    patient unaware of this   Complication of anesthesia    mood alteration / not sure if d/t pain medicine or anesthesia as she passed out    Cough    NASAL DRIP / SNEEZING / SORE THROAT MOSTLY CONSTANT   Depression    Diabetes (HCC)    Diverticulitis    Dizzy    GERD (gastroesophageal reflux disease)    H/O blood clots    arm and leg   HBP (high blood pressure)    Hematuria    gross   Hemorrhoids    HLD (hyperlipidemia)    Muscle pain    Osteoarthritis  Osteoporosis    Panic disorder    Personal history of radiation therapy    PND (post-nasal drip)    CHRONIC WITH SORE THROAT AND SNEEZING   Reflux    Skin cancer    nose   Swelling    Tremor    Tremors of nervous system     Past Surgical History:  Procedure Laterality Date   APPENDECTOMY  1962   BREAST BIOPSY Right 2006?   benign   BREAST BIOPSY Left 2019   invasive mammary carcinoma   BREAST CYST EXCISION Left 07/27/2017   Procedure: SKIN CYST EXCISED;  Surgeon: Robert Bellow, MD;  Location: [facility] ORS;  Service: General;  Laterality: Left;   BREAST LUMPECTOMY Left 07/2017   invasive mammary, DCIS  mammosite   BREAST SURGERY Right 2019   CARPAL TUNNEL  RELEASE Right    CATARACT EXTRACTION W/PHACO Left 11/11/2016   Procedure: CATARACT EXTRACTION PHACO AND INTRAOCULAR LENS PLACEMENT ([place]);  Surgeon: Birder Robson, MD;  Location: [facility] ORS;  Service: Ophthalmology;  Laterality: Left;  [place] 01:07.9 AP% 19.2 CDE 13.02 Fluid pack lot # [id number]   CATARACT EXTRACTION W/PHACO Right 12/09/2016   Procedure: CATARACT EXTRACTION PHACO AND INTRAOCULAR LENS PLACEMENT (IOC);  Surgeon: Birder Robson, MD;  Location: [facility] ORS;  Service: Ophthalmology;  Laterality: Right;  [place] 00:49 AP% 21.2 CDE 10.39 Fluid pack lot # [id number]   CHOLECYSTECTOMY     COLONOSCOPY WITH PROPOFOL N/A 11/22/2014   Procedure: COLONOSCOPY WITH PROPOFOL;  Surgeon: Manya Silvas, MD;  Location: [facility] ENDOSCOPY;  Service: Endoscopy;  Laterality: N/A;   CYSTOSCOPY WITH STENT PLACEMENT Bilateral 11/27/2018   Procedure: CYSTOSCOPY WITH STENT PLACEMENT;  Surgeon: Festus Aloe, MD;  Location: [facility] ORS;  Service: Urology;  Laterality: Bilateral;   CYSTOSCOPY/URETEROSCOPY/HOLMIUM LASER/STENT PLACEMENT Bilateral 12/17/2018   Procedure: CYSTOSCOPY/URETEROSCOPY/HOLMIUM LASER/STENT Exchange;  Surgeon: Billey Co, MD;  Location: [facility] ORS;  Service: Urology;  Laterality: Bilateral;   ESOPHAGOGASTRODUODENOSCOPY  11/22/2014   Procedure: ESOPHAGOGASTRODUODENOSCOPY (EGD);  Surgeon: Manya Silvas, MD;  Location: [facility] ENDOSCOPY;  Service: Endoscopy;;   ESOPHAGOGASTRODUODENOSCOPY (EGD) WITH PROPOFOL N/A 03/05/2017   Procedure: ESOPHAGOGASTRODUODENOSCOPY (EGD) WITH PROPOFOL;  Surgeon: Lin Landsman, MD;  Location: [facility] ENDOSCOPY;  Service: Gastroenterology;  Laterality: N/A;   EXTRACORPOREAL SHOCK WAVE LITHOTRIPSY Right 03/04/2018   Procedure: EXTRACORPOREAL SHOCK WAVE LITHOTRIPSY (ESWL);  Surgeon: Billey Co, MD;  Location: [facility] ORS;  Service: Urology;  Laterality: Right;   EYE SURGERY Bilateral 2012   cataract extraction with iol   GALLBLADDER SURGERY  1968   hemi pelvectomy      left side at age 89   HEMIPELVIC Left [place] Left    AMPUTATION d/t cancer at 88 years old   MASTECTOMY, PARTIAL Left 07/27/2017   Procedure: MASTECTOMY PARTIAL;  Surgeon: Robert Bellow, MD;  Location: [facility] ORS;  Service: General;  Laterality: Left;   MOUTH SURGERY  2019   teeth pulled with a bridge insertion.   fell out 12/16/18   OVARY SURGERY Right    cyst removed   SAVORY DILATION  11/22/2014   Procedure: SAVORY DILATION;  Surgeon: Manya Silvas, MD;  Location: [facility] ENDOSCOPY;  Service: Endoscopy;;   SENTINEL NODE BIOPSY Left 07/27/2017   Procedure: SENTINEL NODE BIOPSY;  Surgeon: Robert Bellow, MD;  Location: [facility] ORS;  Service: General;  Laterality: Left;   SKIN CANCER EXCISION     nose and arm and face   TEE WITHOUT CARDIOVERSION N/A 07/25/2019  Procedure: TRANSESOPHAGEAL ECHOCARDIOGRAM (TEE);  Surgeon: Corey Skains, MD;  Location: [facility] ORS;  Service: Cardiovascular;  Laterality: N/A;   TONSILLECTOMY      Medications Prior to Admission  Medication Sig Dispense Refill Last Dose   acetaminophen (TYLENOL) 650 MG CR tablet Take 650 mg by mouth every 8 (eight) hours as needed for pain.   12/15/2021 at 0800   Biotin 10 MG TABS Take 10 mg by mouth at bedtime.   12/14/2021 at 2000   Cholecalciferol 25 MCG (1000 UT) tablet Take 1,000 Units by mouth daily.   12/15/2021 at 0800   diclofenac Sodium (VOLTAREN) 1 % GEL Apply 4 g topically 4 (four) times daily.   12/15/2021 at 0800   gabapentin (NEURONTIN) 100 MG capsule Take 100 mg by mouth 3 (three) times daily.   12/15/2021 at 0800   GLIPIZIDE XL 2.5 MG 24 hr tablet Take 2.5 mg by mouth daily.    12/15/2021 at 0800   letrozole (FEMARA) 2.5 MG tablet TAKE ONE TABLET BY MOUTH EVERY DAY 90 tablet 3 12/15/2021 at 0800   losartan (COZAAR) 50 MG tablet Take 50 mg by mouth daily.   12/15/2021 at 0800   metFORMIN (GLUCOPHAGE) 500 MG tablet Take 500 mg by mouth daily with breakfast.   12/15/2021 at 0800   metoprolol succinate  (TOPROL-XL) 25 MG 24 hr tablet Take 12.5 mg by mouth at bedtime.    12/14/2021 at 2000   Multiple Vitamin (MULTIVITAMIN WITH MINERALS) TABS tablet Take 1 tablet by mouth daily.   12/15/2021 at 0800   omeprazole (PRILOSEC) 40 MG capsule Take 40 mg by mouth daily.    12/15/2021 at 0800   PARoxetine (PAXIL) 30 MG tablet Take 30 mg by mouth daily.   12/15/2021 at 0800   Polyethyl Glycol-Propyl Glycol 0.4-0.3 % SOLN Apply to eye 2 (two) times daily as needed.   12/15/2021 at 0800   PRADAXA 150 MG CAPS capsule TAKE 1 CAPSULE BY MOUTH TWICE DAILY START TREATMENT 10 HRS LAST DOSE OF LOVENOX (Patient taking differently: Take 150 mg by mouth 2 (two) times daily.) 60 capsule 2 12/15/2021 at 0800   acetaminophen (TYLENOL) 500 MG tablet Take 500 mg by mouth every 6 (six) hours as needed.   prn at prn   Blood Glucose Monitoring Suppl (FIFTY50 GLUCOSE METER 2.0) w/Device KIT See admin instructions. (Patient not taking: Reported on 01/22/2021)      Dentifrices (BIOTENE DRY MOUTH DT) by Transmucosal route.   prn at prn   loratadine (CLARITIN) 10 MG tablet Take 10 mg by mouth daily.  (Patient not taking: Reported on 01/22/2021)      nitrofurantoin, macrocrystal-monohydrate, (MACROBID) 100 MG capsule Take 100 mg by mouth 2 (two) times daily. (Patient not taking: Reported on 12/15/2021)   Not Taking   nystatin (MYCOSTATIN/NYSTOP) powder Apply 1 application  topically. Apply to right groin daily   prn at prn   tiZANidine (ZANAFLEX) 2 MG tablet Take 2 mg by mouth every 12 (twelve) hours as needed for muscle spasms.   prn at prn   Social History   Socioeconomic History   Marital status: Divorced    Spouse name: Not on file   Number of children: Not on file   Years of education: Not on file   Highest education level: Not on file  Occupational History   Occupation: teacher    Comment: retired  Tobacco Use   Smoking status: Never   Smokeless tobacco: Never  Vaping Use   Vaping  Use: Never used  Substance and Sexual  Activity   Alcohol use: No   Drug use: No   Sexual activity: Not Currently  Other Topics Concern   Not on file  Social History Narrative   Patient lives [facility]   Social Determinants of Health   Financial Resource Strain: Not on file  Food Insecurity: Not on file  Transportation Needs: Not on file  Physical Activity: Not on file  Stress: Not on file  Social Connections: Not on file  Intimate Partner Violence: Not on file    Family History  Problem Relation Age of Onset   Breast cancer Paternal Aunt    Ovarian cancer Sister    Prostate cancer Father    Stroke Mother    Kidney cancer Neg Hx    Bladder Cancer Neg Hx       PHYSICAL EXAM General: Elderly and ill-appearing Caucasian female, well nourished, in no acute distress.  Laying in incline in hospital bed on high flow nasal cannula. HEENT:  Normocephalic and atraumatic. Neck:  No JVD.  Lungs: Normal respiratory effort on HFNC 6 L. coarse breath sounds to auscultation  Heart: Tachycardic but regular without appreciable murmurs Abdomen: Non-distended appearing.  Msk: Normal strength and tone for age. Extremities: S/p left AKA, right lower extremity with trace edema neuro: Alert and oriented X 3. Psych:  Answers questions appropriately but is a poor historian..   Labs:   Lab Results  Component Value Date   WBC 12.3 (H) 12/16/2021   HGB 10.5 (L) 12/16/2021   HCT 34.8 (L) 12/16/2021   MCV 81.7 12/16/2021   PLT 127 (L) 12/16/2021    Recent Labs  Lab 12/15/21 1011 12/16/21 0529  NA 132* 135  K 4.0 3.3*  CL 102 107  CO2 20* 22  BUN 25* 27*  CREATININE 1.04* 0.72  CALCIUM 9.8 9.5  PROT 6.8  --   BILITOT 0.5  --   ALKPHOS 43  --   ALT 17  --   AST 33  --   GLUCOSE 332* 194*   No results found for: "CKTOTAL", "CKMB", "CKMBINDEX", "TROPONINI"  Lab Results  Component Value Date   CHOL 110 12/15/2021   Lab Results  Component Value Date   HDL 19 (L) 12/15/2021   Lab  Results  Component Value Date   LDLCALC 46 12/15/2021   Lab Results  Component Value Date   TRIG 223 (H) 12/15/2021   Lab Results  Component Value Date   CHOLHDL 5.8 12/15/2021   No results found for: "LDLDIRECT"    Radiology: CT Angio Chest PE W and/or Wo Contrast  Result Date: 12/15/2021 CLINICAL DATA:  Pulmonary embolism suspected, high probability. Abdominal pain, acute, nonlocalized. Hypoxia EXAM: CT ANGIOGRAPHY CHEST CT ABDOMEN AND PELVIS WITH CONTRAST TECHNIQUE: Multidetector CT imaging of the chest was performed using the standard protocol during bolus administration of intravenous contrast. Multiplanar CT image reconstructions and MIPs were obtained to evaluate the vascular anatomy. Multidetector CT imaging of the abdomen and pelvis was performed using the standard protocol during bolus administration of intravenous contrast. RADIATION DOSE REDUCTION: This exam was performed according to the departmental dose-optimization program which includes automated exposure control, adjustment of the mA and/or kV according to patient size and/or use of iterative reconstruction technique. CONTRAST:  35mL OMNIPAQUE IOHEXOL 350 MG/ML SOLN COMPARISON:  CT chest with contrast 07/23/2019 scratched at CT of the chest, abdomen and pelvis with contrast 07/23/2019 FINDINGS: CTA CHEST FINDINGS Cardiovascular:  Heart is enlarged. Atherosclerotic calcifications are present at the aortic arch and great vessel origins. New significant stenosis or aneurysm is present. Pulmonary artery opacification is excellent. No focal filling defects are present to suggest pulmonary emboli. Pulmonary arteries are enlarged. Mediastinum/Nodes: No enlarged mediastinal, hilar, or axillary lymph nodes. Thyroid gland, trachea, and esophagus demonstrate no significant findings. Lungs/Pleura: The right main pulmonary artery measures up to 25 mm. Left main pulmonary artery measures up to 26 mm. Right middle lobe and right lower lobe  pulmonary nodules on images 40 and 41 of series 6 are stable. Mild dependent airspace disease likely reflects atelectasis. Asymmetric dependent airspace disease is present at the left base. Some asymmetric airspace disease is noted posteriorly in the right upper lobe is well. No significant pleural effusion or pneumothorax is present. Musculoskeletal: No chest wall abnormality. No acute or significant osseous findings. Review of the MIP images confirms the above findings. CT ABDOMEN and PELVIS FINDINGS Hepatobiliary: No focal liver abnormality is seen. Status post cholecystectomy. No biliary dilatation. Pancreas: Unremarkable. No pancreatic ductal dilatation or surrounding inflammatory changes. Spleen: Normal in size without focal abnormality. Adrenals/Urinary Tract: 2 simple cysts are present in the left kidney. The larger measures 10 mm. 5 mm nonobstructing stone is present in the interpolar region of the right kidney. A 7 mm nonobstructing stone is present near the upper pole of the left kidney. Two 5 mm stones are present in the upper and lower poles of the left kidney respectively. At least 2 other punctate nonobstructing stones are present near the upper pole of the left kidney. No solid mass lesion is present. Ureters are within normal limits bilaterally. No obstruction is present. The urinary bladder is within normal limits. Stomach/Bowel: The stomach and duodenum are within normal limits. Small bowel is unremarkable. Terminal ileum is normal. The appendix is visualized and within normal limits. The ascending and transverse colon are within normal limits. Diverticular changes are present the distal descending and sigmoid colon. No inflammatory changes are present to suggest diverticulitis. Vascular/Lymphatic: Atherosclerotic calcifications are present in the aorta and branch vessels. No aneurysm is present. Reproductive: 21 mm left adnexal cyst is stable. Uterus and adnexa are otherwise unremarkable. Other:  Choose 1 Musculoskeletal: Left hemipelvectomy stable. Degenerative changes are noted at the right SI joint. No focal osseous lesions are present. Degenerative changes are present in the lower lumbar spine. Review of the MIP images confirms the above findings. IMPRESSION: 1. No pulmonary embolus. 2. Asymmetric airspace disease at the left base and posteriorly in the right upper lobe concerning for pneumonia. 3. Cardiomegaly with enlargement of the main pulmonary arteries suggesting pulmonary arterial hypertension. 4. Stable right middle lobe and right lower lobe pulmonary nodules. 5. Bilateral nonobstructing renal stones. 6. Descending and sigmoid diverticulosis without evidence for diverticulitis. 7. Stable 21 mm left adnexal cyst. No follow-up imaging recommended. Note: This recommendation does not apply to premenarchal patients and to those with increased risk (genetic, family history, elevated tumor markers or other high-risk factors) of ovarian cancer. Reference: JACR 2020 Feb; 17(2):248-254 8. Degenerative changes in the lower lumbar spine. 9. Aortic Atherosclerosis (ICD10-I70.0). Electronically Signed   By: San Morelle M.D.   On: 12/15/2021 12:17   CT ABDOMEN PELVIS W CONTRAST  Result Date: 12/15/2021 CLINICAL DATA:  Pulmonary embolism suspected, high probability. Abdominal pain, acute, nonlocalized. Hypoxia EXAM: CT ANGIOGRAPHY CHEST CT ABDOMEN AND PELVIS WITH CONTRAST TECHNIQUE: Multidetector CT imaging of the chest was performed using the standard protocol during bolus administration of intravenous  contrast. Multiplanar CT image reconstructions and MIPs were obtained to evaluate the vascular anatomy. Multidetector CT imaging of the abdomen and pelvis was performed using the standard protocol during bolus administration of intravenous contrast. RADIATION DOSE REDUCTION: This exam was performed according to the departmental dose-optimization program which includes automated exposure control,  adjustment of the mA and/or kV according to patient size and/or use of iterative reconstruction technique. CONTRAST:  35mL OMNIPAQUE IOHEXOL 350 MG/ML SOLN COMPARISON:  CT chest with contrast 07/23/2019 scratched at CT of the chest, abdomen and pelvis with contrast 07/23/2019 FINDINGS: CTA CHEST FINDINGS Cardiovascular: Heart is enlarged. Atherosclerotic calcifications are present at the aortic arch and great vessel origins. New significant stenosis or aneurysm is present. Pulmonary artery opacification is excellent. No focal filling defects are present to suggest pulmonary emboli. Pulmonary arteries are enlarged. Mediastinum/Nodes: No enlarged mediastinal, hilar, or axillary lymph nodes. Thyroid gland, trachea, and esophagus demonstrate no significant findings. Lungs/Pleura: The right main pulmonary artery measures up to 25 mm. Left main pulmonary artery measures up to 26 mm. Right middle lobe and right lower lobe pulmonary nodules on images 40 and 41 of series 6 are stable. Mild dependent airspace disease likely reflects atelectasis. Asymmetric dependent airspace disease is present at the left base. Some asymmetric airspace disease is noted posteriorly in the right upper lobe is well. No significant pleural effusion or pneumothorax is present. Musculoskeletal: No chest wall abnormality. No acute or significant osseous findings. Review of the MIP images confirms the above findings. CT ABDOMEN and PELVIS FINDINGS Hepatobiliary: No focal liver abnormality is seen. Status post cholecystectomy. No biliary dilatation. Pancreas: Unremarkable. No pancreatic ductal dilatation or surrounding inflammatory changes. Spleen: Normal in size without focal abnormality. Adrenals/Urinary Tract: 2 simple cysts are present in the left kidney. The larger measures 10 mm. 5 mm nonobstructing stone is present in the interpolar region of the right kidney. A 7 mm nonobstructing stone is present near the upper pole of the left kidney. Two 5  mm stones are present in the upper and lower poles of the left kidney respectively. At least 2 other punctate nonobstructing stones are present near the upper pole of the left kidney. No solid mass lesion is present. Ureters are within normal limits bilaterally. No obstruction is present. The urinary bladder is within normal limits. Stomach/Bowel: The stomach and duodenum are within normal limits. Small bowel is unremarkable. Terminal ileum is normal. The appendix is visualized and within normal limits. The ascending and transverse colon are within normal limits. Diverticular changes are present the distal descending and sigmoid colon. No inflammatory changes are present to suggest diverticulitis. Vascular/Lymphatic: Atherosclerotic calcifications are present in the aorta and branch vessels. No aneurysm is present. Reproductive: 21 mm left adnexal cyst is stable. Uterus and adnexa are otherwise unremarkable. Other: Choose 1 Musculoskeletal: Left hemipelvectomy stable. Degenerative changes are noted at the right SI joint. No focal osseous lesions are present. Degenerative changes are present in the lower lumbar spine. Review of the MIP images confirms the above findings. IMPRESSION: 1. No pulmonary embolus. 2. Asymmetric airspace disease at the left base and posteriorly in the right upper lobe concerning for pneumonia. 3. Cardiomegaly with enlargement of the main pulmonary arteries suggesting pulmonary arterial hypertension. 4. Stable right middle lobe and right lower lobe pulmonary nodules. 5. Bilateral nonobstructing renal stones. 6. Descending and sigmoid diverticulosis without evidence for diverticulitis. 7. Stable 21 mm left adnexal cyst. No follow-up imaging recommended. Note: This recommendation does not apply to premenarchal patients and to  those with increased risk (genetic, family history, elevated tumor markers or other high-risk factors) of ovarian cancer. Reference: JACR 2020 Feb; 17(2):248-254 8.  Degenerative changes in the lower lumbar spine. 9. Aortic Atherosclerosis (ICD10-I70.0). Electronically Signed   By: San Morelle M.D.   On: 12/15/2021 12:17   DG Chest Port 1 View  Result Date: 12/15/2021 CLINICAL DATA:  88 year old female with possible sepsis. EXAM: PORTABLE CHEST 1 VIEW COMPARISON:  Chest x-ray 10/02/2017. FINDINGS: Lung volumes are low. Blunting of the left costophrenic sulcus. Opacity at the left base which may reflect atelectasis and/or consolidation. Diffuse interstitial prominence and peribronchial cuffing, concerning for an acute bronchitis. No pneumothorax. Heart size is upper limits of normal. The patient is rotated to the left on today's exam, resulting in distortion of the mediastinal contours and reduced diagnostic sensitivity and specificity for mediastinal pathology. IMPRESSION: 1. Findings are concerning for severe acute bronchitis, likely with developing left lower lobe bronchopneumonia and small parapneumonic pleural effusion. Followup PA and lateral chest X-ray is recommended in 3-4 weeks following trial of antibiotic therapy to ensure resolution and exclude underlying malignancy. Electronically Signed   By: Vinnie Langton M.D.   On: 12/15/2021 10:29    ECHO 07/24/2019  1. Left ventricular ejection fraction, by estimation, is 55 to 60%. The  left ventricle has normal function. The left ventricle has no regional  wall motion abnormalities.   2. Right ventricular systolic function is normal. The right ventricular  size is normal.   3. Left atrial size was mild to moderately dilated. No left atrial/left  atrial appendage thrombus was detected.   4. The mitral valve is normal in structure. Mild to moderate mitral valve  regurgitation.   5. The aortic valve is normal in structure. Aortic valve regurgitation is  trivial.   6. There is mild (Grade II) plaque.   FINDINGS   Left Ventricle: Left ventricular ejection fraction, by estimation, is 55  to 60%.  The left ventricle has normal function. The left ventricle has no  regional wall motion abnormalities. The left ventricular internal cavity  size was normal in size. There is   no left ventricular hypertrophy.   Right Ventricle: The right ventricular size is normal. No increase in  right ventricular wall thickness. Right ventricular systolic function is  normal.   Left Atrium: Left atrial size was mild to moderately dilated. No left  atrial/left atrial appendage thrombus was detected.   Right Atrium: Right atrial size was normal in size.   Pericardium: There is no evidence of pericardial effusion.   Mitral Valve: The mitral valve is normal in structure. Mild to moderate  mitral valve regurgitation. There is no evidence of mitral valve  vegetation.   Tricuspid Valve: The tricuspid valve is normal in structure. Tricuspid  valve regurgitation is mild. There is no evidence of tricuspid valve  vegetation.   Aortic Valve: The aortic valve is normal in structure. Aortic valve  regurgitation is trivial. There is no evidence of aortic valve vegetation.   Pulmonic Valve: The pulmonic valve was grossly normal. Pulmonic valve  regurgitation is not visualized.   Aorta: The aortic root and ascending aorta are structurally normal, with  no evidence of dilitation. There is mild (Grade II) plaque.   IAS/Shunts: There is right bowing of the interatrial septum, suggestive of  elevated left atrial pressure. No atrial level shunt detected by color  flow Doppler.   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  She was hypoxic to 78%, on room air, requiring 6 L of supplemental oxygen.  CTA chest negative for PE but concerning for pneumonia.  She met sepsis criteria with leukocytosis to 16, tachycardia to the 110s, and elevated lactate.  Cardiology is consulted due to her elevated troponin.  #Sepsis 2/2 HCAP #Acute hypoxic respiratory failure #Elevated troponin  #factor V leiden  Patient presents with a several day history of poor appetite and "just not feeling well" and was hypoxic on room air to 78% initially requiring 6L of HFNC. She is being treated empirically for pneumonia with azithromycin and ceftriaxone.  A troponin was checked on admission which was initially elevated at 475 and downtrending thereafter to 342 most recently.  In the absence of chest pain or significant EKG changes, this likely represents demand ischemia and not ACS. -agree with current therapy per primary team  -s/p 325 mg aspirin, continue 81 mg aspirin daily in addition to Pradaxa 150 twice daily -Defer heparin drip -Continue metoprolol XL 12.5 mg once daily as her blood pressure allows -Echocardiogram complete -Recommend conservative input from a cardiac standpoint, per Dr. Saralyn Pilar  This patient's plan of care was discussed and created with Dr. Saralyn Pilar and he is in agreement.  Signed: Tristan Schroeder , PA-C 12/16/2021, 11:23 AM [facility]

## 2021-12-17 DIAGNOSIS — J189 Pneumonia, unspecified organism: Secondary | ICD-10-CM | POA: Diagnosis not present

## 2021-12-17 DIAGNOSIS — I480 Paroxysmal atrial fibrillation: Secondary | ICD-10-CM | POA: Diagnosis not present

## 2021-12-17 DIAGNOSIS — I4891 Unspecified atrial fibrillation: Secondary | ICD-10-CM | POA: Diagnosis not present

## 2021-12-17 LAB — COMPREHENSIVE METABOLIC PANEL
ALT: 26 U/L (ref 0–44)
AST: 25 U/L (ref 15–41)
Albumin: 2.7 g/dL — ABNORMAL LOW (ref 3.5–5.0)
Alkaline Phosphatase: 45 U/L (ref 38–126)
Anion gap: 7 (ref 5–15)
BUN: 28 mg/dL — ABNORMAL HIGH (ref 8–23)
CO2: 20 mmol/L — ABNORMAL LOW (ref 22–32)
Calcium: 9.5 mg/dL (ref 8.9–10.3)
Chloride: 109 mmol/L (ref 98–111)
Creatinine, Ser: 0.61 mg/dL (ref 0.44–1.00)
GFR, Estimated: >60 mL/min (ref >60)
Glucose, Bld: 199 mg/dL — ABNORMAL HIGH (ref 70–99)
Potassium: 3.5 mmol/L (ref 3.5–5.1)
Sodium: 136 mmol/L (ref 135–145)
Total Bilirubin: 0.5 mg/dL (ref 0.3–1.2)
Total Protein: 6 g/dL — ABNORMAL LOW (ref 6.5–8.1)

## 2021-12-17 LAB — CBC
HCT: 36.4 % (ref 36.0–46.0)
Hemoglobin: 10.9 g/dL — ABNORMAL LOW (ref 12.0–15.0)
MCH: 24.3 pg — ABNORMAL LOW (ref 26.0–34.0)
MCHC: 29.9 g/dL — ABNORMAL LOW (ref 30.0–36.0)
MCV: 81.3 fL (ref 80.0–100.0)
Platelets: 125 10*3/uL — ABNORMAL LOW (ref 150–400)
RBC: 4.48 MIL/uL (ref 3.87–5.11)
RDW: 17.9 % — ABNORMAL HIGH (ref 11.5–15.5)
WBC: 8.6 10*3/uL (ref 4.0–10.5)
nRBC: 0 % (ref 0.0–0.2)

## 2021-12-17 LAB — ECHOCARDIOGRAM COMPLETE
AR max vel: 1.99 cm2
AV Area VTI: 2.65 cm2
AV Area mean vel: 2.09 cm2
AV Mean grad: 2 mmHg
AV Peak grad: 4.2 mmHg
Ao pk vel: 1.02 m/s
Area-P 1/2: 4.96 cm2
S' Lateral: 2.53 cm
Weight: 2832 oz

## 2021-12-17 LAB — C DIFFICILE QUICK SCREEN W PCR REFLEX
C Diff antigen: NEGATIVE
C Diff interpretation: NOT DETECTED
C Diff toxin: NEGATIVE

## 2021-12-17 LAB — URINE CULTURE: Culture: <10000 — AB

## 2021-12-17 LAB — LEGIONELLA PNEUMOPHILA SEROGP 1 UR AG: L. pneumophila Serogp 1 Ur Ag: NEGATIVE

## 2021-12-17 LAB — MAGNESIUM: Magnesium: 1.5 mg/dL — ABNORMAL LOW (ref 1.7–2.4)

## 2021-12-17 LAB — T4, FREE: Free T4: 1.25 ng/dL — ABNORMAL HIGH (ref 0.61–1.12)

## 2021-12-17 LAB — TSH: TSH: <0.01 u[IU]/mL — ABNORMAL LOW (ref 0.350–4.500)

## 2021-12-17 MED ORDER — MAGNESIUM SULFATE 2 GM/50ML IV SOLN
2.0000 g | Freq: Once | INTRAVENOUS | Status: AC
  Administered 2021-12-17: 2 g via INTRAVENOUS
  Filled 2021-12-17: qty 50

## 2021-12-17 MED ORDER — POTASSIUM CHLORIDE CRYS ER 20 MEQ PO TBCR
40.0000 meq | EXTENDED_RELEASE_TABLET | Freq: Two times a day (BID) | ORAL | Status: DC
Start: 2021-12-17 — End: 2021-12-17
  Administered 2021-12-17: 40 meq via ORAL
  Filled 2021-12-17: qty 2

## 2021-12-17 MED ORDER — METOPROLOL TARTRATE 5 MG/5ML IV SOLN
5.0000 mg | Freq: Once | INTRAVENOUS | Status: DC | PRN
  Filled 2021-12-17: qty 5

## 2021-12-17 MED ORDER — POTASSIUM CHLORIDE CRYS ER 20 MEQ PO TBCR
40.0000 meq | EXTENDED_RELEASE_TABLET | Freq: Once | ORAL | Status: AC
  Administered 2021-12-17: 40 meq via ORAL
  Filled 2021-12-17: qty 2

## 2021-12-17 MED ORDER — METOPROLOL TARTRATE 25 MG PO TABS
12.5000 mg | ORAL_TABLET | Freq: Three times a day (TID) | ORAL | Status: DC
Start: 2021-12-17 — End: 2021-12-18
  Administered 2021-12-17 – 2021-12-18 (×4): 12.5 mg via ORAL
  Filled 2021-12-17 (×4): qty 1

## 2021-12-17 MED ORDER — FUROSEMIDE 10 MG/ML IJ SOLN
40.0000 mg | Freq: Once | INTRAMUSCULAR | Status: AC
  Administered 2021-12-17: 40 mg via INTRAVENOUS
  Filled 2021-12-17: qty 4

## 2021-12-17 MED ORDER — METOPROLOL SUCCINATE ER 25 MG PO TB24: 12.5000 mg | ORAL_TABLET | Freq: Two times a day (BID) | ORAL | Status: DC

## 2021-12-17 MED ORDER — GLUCERNA SHAKE PO LIQD
237.0000 mL | Freq: Three times a day (TID) | ORAL | Status: DC
  Administered 2021-12-17 – 2021-12-18 (×3): 237 mL via ORAL

## 2021-12-17 NOTE — Progress Notes (Addendum)
Progress Note   Patient: Julie Acosta ZOX:[id number] DOB: Jul 22, 1933 DOA: 12/15/2021     2 DOS: the patient was seen and examined on 12/17/2021   Brief hospital course: EVVIE BEHRMANN is a 88 y.o. 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   Patient stated she has generalized weakness in the past several days, which has been progressively worsening.  He has nausea, denies vomiting, diarrhea or abdominal pain. Patient is normally not using oxygen, but she was found to have oxygen desaturation to 78% on room air in ED, which improved to 92-93% on 6 L oxygen however, patient denies cough, shortness breath and chest pain.  No fever or chills.  Denies symptoms of UTI.  No unilateral numbness or tinglings in extremities.  No facial droop or slurred speech.  She has poor appetite and decreased oral intake.  She reports sore throat.   Data reviewed independently and ED Course: pt was found to have WBC 16.7, negative COVID PCR, lactic acid 2.4, INR 1.3, PTT 31, troponin level 475, mild AKI with creatinine 1.04, BUN 25, GFR 53.  Liver function normal, lipase 22.  Temperature normal, blood pressure 115/60, heart rate 111, RR 25.  Chest x-ray showed possible left lower lobe infiltration.  CTA negative for PE, showed infiltration in left base and the right upper lobe.  Patient is admitted to PCU as inpatient  Assessment and Plan: * HCAP (healthcare-associated pneumonia) Acute respiratory failure with hypoxia and severe sepsis due to HCAP:  Patient had a new oxygen requirement on admission and is currently on 4 L from 6 L to maintain pulse oximetry greater than 92%. She is less tachypneic and has reduced work of  breathing compared to admission Continue to wean off oxygen as tolerated        Acute respiratory failure with hypoxia ([place]) Secondary to healthcare associated pneumonia. Patient is currently down to 4 L of oxygen from 6 L of oxygen We will attempt to wean off oxygen as tolerated  Severe sepsis (HCC) Appears to be multifactorial and secondary to healthcare associated pneumonia as well as a UTI CT angio shows asymmetric airspace disease at the left base and posteriorly in the right upper lobe concerning for pneumonia Patient also has pyuria Patient also noted to have E. coli bacteremia, blood cultures yielded greater than 100,000 CFU of E. coli Continue empiric antibiotic therapy with Rocephin and azithromycin Patient will require IV antibiotic therapy for at least 10 days for bacteremia   Type 2 MI (myocardial infarction) ([place]) Type 2 MI, Most likely secondary to demand ischemia from severe sepsis and hypoxia Troponin 475, no chest pain. Continue aspirin and metoprolol 2D echocardiogram shows an LVEF of 60 to 65% with no evidence of regional wall motion abnormality.  Appreciate cardiology consult  DVT (deep venous thrombosis) (HCC) Stable Continue Pradaxa  Controlled type 2 diabetes mellitus without complication, without long-term current use of insulin (HCC) Recent A1c 6.6, well controlled.  Patient is taking metformin and glipizide. Oral intake has been poor since admission Check blood sugars with meals and start sliding scale if patient has hyperglycemia Start patient on Glucerna  Hypertension - IV hydralazine as needed for systolic blood pressure greater than 160 -Hold home Cozaar since patient has been normotensive -Continue metoprolol  Breast cancer  of upper-outer quadrant of left female breast [facility]) S/p of radiation therapy Continue letrozole  Obesity with body mass index of 30.0-39.9  BMI= 34.57  and BW= 58.5 Complicates overall prognosis and  care Lifestyle modification and exercise discussed with patient   Depression Stable Continue Paxil  Atrial fibrillation with RVR ([place]) New onset Patient has a CHA2DS2-VASc score of 4 and ideally requires long-term anticoagulation as primary prophylaxis for an acute stroke Continue metoprolol for rate control Continue Pradaxa Noted to have a low TSH level Awaiting results of free T3 and T4        Subjective: Patient is seen and examined at bedside.  Has very poor oral intake.  States that she has no appetite.  Physical Exam: Vitals:   12/17/21 0640 12/17/21 0641 12/17/21 0745 12/17/21 1139  BP:  (!) 118/56 110/67 (!) 110/97  Pulse:  76 (!) 111 89  Resp:   20 17  Temp:   97.6 F (36.4 C) 98.3 F (36.8 C)  TempSrc:   Oral   SpO2:  97% 97% 98%  Weight: 79.6 kg      General: Not in acute distress HEENT:       Eyes: PERRL, EOMI, no scleral icterus.       ENT: No discharge from the ears and nose, has pharynx injection, no tonsillar enlargement.        Neck: No JVD, no bruit, no mass felt. Heme: No neck lymph node enlargement. Cardiac: S1/S2, irregularly irregular, No murmurs, No gallops or rubs. Respiratory: has fine crackles bilaterally GI: Soft, nondistended, nontender, no rebound pain, no organomegaly, BS present. GU: No hematuria Ext: No pitting leg edema bilaterally. 1+DP/PT pulse in right leg. S/p of left leg amputation Musculoskeletal: No joint deformities, No joint redness or warmth, no limitation of ROM in spin. Skin: No rashes.  Neuro: Alert, oriented X3, cranial nerves II-XII grossly intact, moves all extremities. Psych: Patient is not psychotic, no suicidal or hemocidal ideation.  Data Reviewed: Relevant notes from primary care and specialist visits, past discharge summaries as available in EHR, including Care Everywhere. Prior diagnostic testing as pertinent to current admission diagnoses Updated medications and problem lists for reconciliation ED course,  including vitals, labs, imaging, treatment and response to treatment Triage notes, nursing and pharmacy notes and ED provider's notes Notable results as noted in HPI Labs reviewed.  Noted to have a TSH less than 0.10 Blood cultures yielded E. Coli.  Urine culture less than 10,000 colonies, insignificant growth There are no new results to review at this time.  Family Communication: Greater than 50% of time was spent discussing plan of care with patient at the bedside.  She verbalizes understanding and agrees to the plan.  Disposition: Status is: Inpatient Remains inpatient appropriate because: Continues to require IV antibiotic therapy for bacteremia  Planned Discharge Destination: Skilled nursing facility    Time spent: 35 minutes  Author: Collier Bullock, MD 12/17/2021 1:17 PM  For on call review [email or web address].

## 2021-12-17 NOTE — TOC Progression Note (Signed)
Transition of Care [facility]) - Progression Note    Patient Details  Name: Julie Acosta MRN: [id number] Date of Birth: August 30, 1933  Transition of Care [facility]) CM/SW [place], LCSW Phone Number: 12/17/2021, 2:15 PM  Clinical Narrative:  Notified SNF admissions coordinator that patient will likely need IV abx at return. Will need to get prior authorization prior to return. MD will consult PT and OT as well to see if she has any therapy needs, per SNF request.  Expected Discharge Plan: Skilled Nursing Facility Barriers to Discharge: Continued Medical Work up  Expected Discharge Plan and Services Expected Discharge Plan: [place] Acute Care Choice: Resumption of Svcs/PTA Provider Living arrangements for the past 2 months: Skilled Nursing Facility                                       Social Determinants of Health (SDOH) Interventions    Readmission Risk Interventions     No data to display

## 2021-12-17 NOTE — NC FL2 (Signed)
[place] LEVEL OF CARE SCREENING TOOL     IDENTIFICATION  Patient Name: Julie Acosta Birthdate: [birth year] Sex: female Admission Date (Current Location): 12/15/2021  [place] [facility] and [place] Number:  Engineering geologist and Address:  [facility], [address], [place], [place] [address]      Provider Number: [id number]  Attending Physician Name and Address:  Collier Bullock, MD  Relative Name and Phone Number:       Current Level of Care: Hospital Recommended Level of Care: [place] Prior Approval Number:    Date Approved/Denied:   PASRR Number: [id number]  Discharge Plan: SNF    Current Diagnoses: Patient Active Problem List   Diagnosis Date Noted   Atrial fibrillation with RVR ([place]) 12/17/2021   HCAP (healthcare-associated pneumonia) 12/15/2021   Type 2 MI (myocardial infarction) ([place]) 12/15/2021   Severe sepsis ([place]) 12/15/2021   Acute respiratory failure with hypoxia ([place]) 12/15/2021   PAD (peripheral artery disease) ([place]) 07/25/2020   Sepsis ([place])    Splenic infarct 07/23/2019   Coagulation disorder ([place]) 05/30/2019   Ureteral stone with hydronephrosis 11/26/2018   Pain due to onychomycosis of toenail of right foot 10/18/2018   Breast cancer of upper-outer quadrant of left female breast ([place]) 07/27/2017   Obesity with body mass index of 30.0-39.9 07/27/2017   DVT (deep venous thrombosis) ([place]) 07/27/2017   Primary cancer of upper outer quadrant of left female breast ([place]) 06/12/2017   Diarrhea 01/16/2017   Hematuria 01/12/2017   Chronic diarrhea 01/12/2017   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   Right leg swelling 10/13/2016   Microscopic hematuria 06/02/2015   Benign breast lumps 05/30/2015   Cancer ([place]) 05/30/2015   Depression 05/30/2015   Controlled type 2 diabetes mellitus without complication, without long-term current use of insulin ([place]) 05/30/2015   Hyperlipidemia, unspecified 05/30/2015    Hypertension 05/30/2015   Osteoporosis, post-menopausal 05/30/2015   Panic attacks 05/30/2015    Orientation RESPIRATION BLADDER Height & Weight     Self, Place  O2 (Nasal Cannula 4 L) Incontinent, External catheter Weight: 175 lb 8 oz (79.6 kg) Height:     BEHAVIORAL SYMPTOMS/MOOD NEUROLOGICAL BOWEL NUTRITION STATUS   (None)  (None) Continent Diet (Heart healthy/carb modified)  AMBULATORY STATUS COMMUNICATION OF NEEDS Skin     Verbally Normal                       Personal Care Assistance Level of Assistance              Functional Limitations Info  Sight, Hearing, Speech Sight Info: Adequate Hearing Info: Adequate Speech Info: Adequate    SPECIAL CARE FACTORS FREQUENCY                       Contractures Contractures Info: Not present    Additional Factors Info  Code Status, Allergies, Psychotropic Code Status Info: DNR Allergies Info: Ciprofloxacin, Codeine, Tape, Ciprocinonide (Fluocinolone), Amoxicillin, Cefuroxime, Latex, Lipitor (Atorvastatin) Psychotropic Info: Depression         Current Medications (12/17/2021):  This is the current hospital active medication list Current Facility-Administered Medications  Medication Dose Route Frequency Provider Last Rate Last Admin   0.9 %  sodium chloride infusion   Intravenous Continuous Ivor Costa, MD 75 mL/hr at 12/17/21 0624 Infusion Verify at 12/17/21 0973   acetaminophen (TYLENOL) tablet 650 mg  650 mg Oral Q6H PRN Ivor Costa, MD       albuterol (  PROVENTIL) (2.5 MG/3ML) 0.083% nebulizer solution 2.5 mg  2.5 mg Nebulization Q4H PRN Ivor Costa, MD       aspirin EC tablet 81 mg  81 mg Oral Daily Ivor Costa, MD   81 mg at 12/17/21 0851   azithromycin (ZITHROMAX) 250 mg in dextrose 5 % 125 mL IVPB  250 mg Intravenous Q24H Agbata, Tochukwu, MD   Stopped at 12/16/21 1720   cefTRIAXone (ROCEPHIN) 2 g in sodium chloride 0.9 % 100 mL IVPB  2 g Intravenous Q24H Foust, Katy L, NP   Stopped at 12/16/21 1316    cholecalciferol (VITAMIN D3) tablet 1,000 Units  1,000 Units Oral Daily Ivor Costa, MD   1,000 Units at 12/17/21 0851   dabigatran (PRADAXA) capsule 150 mg  150 mg Oral BID Ivor Costa, MD   150 mg at 12/17/21 0854   dextromethorphan-guaiFENesin ([place] DM) 30-600 MG per 12 hr tablet 1 tablet  1 tablet Oral BID PRN Ivor Costa, MD       diclofenac Sodium (VOLTAREN) 1 % topical gel 4 g  4 g Topical QID Ivor Costa, MD   4 g at 12/17/21 0071   furosemide (LASIX) injection 40 mg  40 mg Intravenous Once Tang, Alanson Puls, PA-C       gabapentin (NEURONTIN) capsule 100 mg  100 mg Oral TID Ivor Costa, MD   100 mg at 12/17/21 2197   hydrALAZINE (APRESOLINE) injection 5 mg  5 mg Intravenous Q2H PRN Ivor Costa, MD       letrozole [facility]) tablet 2.5 mg  2.5 mg Oral Daily Ivor Costa, MD   2.5 mg at 12/17/21 0854   metoprolol tartrate (LOPRESSOR) injection 5 mg  5 mg Intravenous Once PRN Foust, Katy L, NP       metoprolol tartrate (LOPRESSOR) tablet 12.5 mg  12.5 mg Oral TID Tristan Schroeder, PA-C   12.5 mg at 12/17/21 0957   multivitamin with minerals tablet 1 tablet  1 tablet Oral Daily Ivor Costa, MD   1 tablet at 12/17/21 0852   ondansetron (ZOFRAN) injection 4 mg  4 mg Intravenous Q8H PRN Ivor Costa, MD       pantoprazole (PROTONIX) EC tablet 40 mg  40 mg Oral Daily Ivor Costa, MD   40 mg at 12/16/21 0857   PARoxetine (PAXIL) tablet 30 mg  30 mg Oral Daily Ivor Costa, MD   30 mg at 12/17/21 5883   polyvinyl alcohol (LIQUIFILM TEARS) 1.4 % ophthalmic solution 1 drop  1 drop Both Eyes BID PRN Ivor Costa, MD       potassium chloride SA (KLOR-CON M) CR tablet 40 mEq  40 mEq Oral BID Tang, Alanson Puls, PA-C       tiZANidine (ZANAFLEX) tablet 2 mg  2 mg Oral Q12H PRN Ivor Costa, MD         Discharge Medications: Please see discharge summary for a list of discharge medications.  Relevant Imaging Results:  Relevant Lab Results:   Additional Information SS#: [id number]  Candie Chroman,  LCSW

## 2021-12-17 NOTE — Progress Notes (Addendum)
[place] NOTE       Patient ID: [name] MRN: [id number] DOB/AGE: [id number] 86 y.o.  Admit date: 12/15/2021 Referring Physician Dr. Francine Graven Primary Physician Dr. 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  She was hypoxic to 78%, on room air, requiring 6 L of supplemental oxygen.  CTA chest negative for PE but concerning for pneumonia.  She met sepsis criteria with leukocytosis to 16, tachycardia to the 110s, and elevated lactate.  Cardiology is consulted due to her elevated troponin. She converted to AF with RVR the early morning of 8/15 in the setting of hypomagnesemia, abnormally low TSH, and ongoing treatment for pneumonia.  Interval History: -crosscover notified by nursing early this morning of atrial fibrillation with rates in the 120s-130s on telemetry  -she continues to deny feeling poorly, is a relatively poor historian -no chest pain, palpitations, does not feel short of breath -Oxygen weaned from 6 L to 4 L today -Echo resulted with preserved LVEF of 60-65%, aortic sclerosis without stenosis, mild MR  Review of systems complete and found to be negative unless listed above     Past Medical History:  Diagnosis Date   Anxiety    Benign breast lumps    Blood clotting disorder (HCC)    Bone cancer ([place])    head of femur, left leg ... amputation at age 53   Breast cancer of upper-outer quadrant of left female breast ([place]) 07/27/2017   11 mm invasive mammary carcinoma, ER 90%, PR 51-90%, negative margins.  Oncotype recurrence score: 1.  DCIS, margin less than 0.5 mm.   Negative sentinel node.  Accelerated partial breast radiation.   Cervicalgia    Chronic kidney disease    kidney stones   Colon cancer [facility])    patient unaware of this   Complication of anesthesia    mood alteration / not sure if d/t pain medicine or anesthesia as she passed out    Cough    NASAL DRIP / SNEEZING / SORE THROAT MOSTLY CONSTANT   Depression    Diabetes ([place])    Diverticulitis    Dizzy    GERD (gastroesophageal reflux disease)    H/O blood clots    arm and leg   HBP (high blood pressure)    Hematuria    gross   Hemorrhoids    HLD (hyperlipidemia)    Muscle pain    Osteoarthritis    Osteoporosis    Panic disorder    Personal history of radiation therapy    PND (post-nasal drip)    CHRONIC WITH SORE THROAT AND SNEEZING   Reflux    Skin cancer    nose   Swelling    Tremor    Tremors of nervous system     Past Surgical History:  Procedure Laterality Date   APPENDECTOMY  1962   BREAST BIOPSY Right 2006?   benign   BREAST BIOPSY Left 2019   invasive mammary carcinoma   BREAST CYST EXCISION Left 07/27/2017   Procedure: SKIN CYST EXCISED;  Surgeon: Robert Bellow, MD;  Location: [facility] ORS;  Service: General;  Laterality: Left;   BREAST LUMPECTOMY  Left 07/2017   invasive mammary, DCIS  mammosite   BREAST SURGERY Right 2019   CARPAL TUNNEL RELEASE Right    CATARACT EXTRACTION W/PHACO Left 11/11/2016   Procedure: CATARACT EXTRACTION PHACO AND INTRAOCULAR LENS PLACEMENT ([place]);  Surgeon: Birder Robson, MD;  Location: [facility] ORS;  Service: Ophthalmology;  Laterality: Left;  [place] 01:07.9 AP% 19.2 CDE 13.02 Fluid pack lot # [id number]   CATARACT EXTRACTION W/PHACO Right 12/09/2016   Procedure: CATARACT EXTRACTION PHACO AND INTRAOCULAR LENS PLACEMENT (IOC);  Surgeon: Birder Robson, MD;  Location: [facility] ORS;  Service: Ophthalmology;  Laterality: Right;  [place] 00:49 AP% 21.2 CDE 10.39 Fluid pack lot # [id number]   CHOLECYSTECTOMY     COLONOSCOPY WITH PROPOFOL N/A  11/22/2014   Procedure: COLONOSCOPY WITH PROPOFOL;  Surgeon: Manya Silvas, MD;  Location: [facility] ENDOSCOPY;  Service: Endoscopy;  Laterality: N/A;   CYSTOSCOPY WITH STENT PLACEMENT Bilateral 11/27/2018   Procedure: CYSTOSCOPY WITH STENT PLACEMENT;  Surgeon: Festus Aloe, MD;  Location: [facility] ORS;  Service: Urology;  Laterality: Bilateral;   CYSTOSCOPY/URETEROSCOPY/HOLMIUM LASER/STENT PLACEMENT Bilateral 12/17/2018   Procedure: CYSTOSCOPY/URETEROSCOPY/HOLMIUM LASER/STENT Exchange;  Surgeon: Billey Co, MD;  Location: [facility] ORS;  Service: Urology;  Laterality: Bilateral;   ESOPHAGOGASTRODUODENOSCOPY  11/22/2014   Procedure: ESOPHAGOGASTRODUODENOSCOPY (EGD);  Surgeon: Manya Silvas, MD;  Location: [facility] ENDOSCOPY;  Service: Endoscopy;;   ESOPHAGOGASTRODUODENOSCOPY (EGD) WITH PROPOFOL N/A 03/05/2017   Procedure: ESOPHAGOGASTRODUODENOSCOPY (EGD) WITH PROPOFOL;  Surgeon: Lin Landsman, MD;  Location[facility]COPY;  Service: Gastroenterology;  Laterality: N/A;   EXTRACORPOREAL SHOCK WAVE LITHOTRIPSY Right 03/04/2018   Procedure: EXTRACORPOREAL SHOCK WAVE LITHOTRIPSY (ESWL);  Surgeon: Billey Co, MD;  Location: [facility] ORS;  Service: Urology;  Laterality: Right;   EYE SURGERY Bilateral 2012   cataract extraction with iol   GALLBLADDER SURGERY  1968   hemi pelvectomy     left side at age 72   HEMIPELVIC Left [place] Left    AMPUTATION d/t cancer at 88 years old   MASTECTOMY, PARTIAL Left 07/27/2017   Procedure: MASTECTOMY PARTIAL;  Surgeon: Robert Bellow, MD;  Location: [facility] ORS;  Service: General;  Laterality: Left;   MOUTH SURGERY  2019   teeth pulled with a bridge insertion.   fell out 12/16/18   OVARY SURGERY Right    cyst removed   SAVORY DILATION  11/22/2014   Procedure: SAVORY DILATION;  Surgeon: Manya Silvas, MD;  Location: [facility] ENDOSCOPY;  Service: Endoscopy;;   SENTINEL NODE BIOPSY Left 07/27/2017   Procedure: SENTINEL NODE BIOPSY;  Surgeon: Robert Bellow, MD;  Location: [facility] ORS;  Service: General;  Laterality: Left;   SKIN CANCER EXCISION     nose and arm and face   TEE WITHOUT CARDIOVERSION N/A 07/25/2019   Procedure: TRANSESOPHAGEAL ECHOCARDIOGRAM (TEE);  Surgeon: Corey Skains, MD;  Location: [facility] ORS;  Service: Cardiovascular;  Laterality: N/A;   TONSILLECTOMY      Medications Prior to Admission  Medication Sig Dispense Refill Last Dose   acetaminophen (TYLENOL) 650 MG CR tablet Take 650 mg by mouth every 8 (eight) hours as needed for pain.   12/15/2021 at 0800   Biotin 10 MG TABS Take 10 mg by mouth at bedtime.   12/14/2021 at 2000   Cholecalciferol 25 MCG (1000 UT) tablet Take 1,000 Units by mouth daily.   12/15/2021 at 0800   diclofenac Sodium (VOLTAREN) 1 % GEL Apply 4 g topically 4 (four) times daily.   12/15/2021 at 0800  gabapentin (NEURONTIN) 100 MG capsule Take 100 mg by mouth 3 (three) times daily.   12/15/2021 at 0800   GLIPIZIDE XL 2.5 MG 24 hr tablet Take 2.5 mg by mouth daily.    12/15/2021 at 0800   letrozole (FEMARA) 2.5 MG tablet TAKE ONE TABLET BY MOUTH EVERY DAY 90 tablet 3 12/15/2021 at 0800   losartan (COZAAR) 50 MG tablet Take 50 mg by mouth daily.   12/15/2021 at 0800   metFORMIN (GLUCOPHAGE) 500 MG tablet Take 500 mg by mouth daily with breakfast.   12/15/2021 at 0800   metoprolol succinate (TOPROL-XL) 25 MG 24 hr tablet Take 12.5 mg by mouth at bedtime.    12/14/2021 at 2000   Multiple Vitamin (MULTIVITAMIN WITH MINERALS) TABS tablet Take 1 tablet by mouth daily.   12/15/2021 at 0800   omeprazole (PRILOSEC) 40 MG capsule Take 40 mg by mouth daily.    12/15/2021 at 0800   PARoxetine (PAXIL) 30 MG tablet Take 30 mg by mouth daily.   12/15/2021 at 0800   Polyethyl Glycol-Propyl Glycol 0.4-0.3 % SOLN Apply to eye 2 (two) times daily as needed.   12/15/2021 at 0800   PRADAXA 150 MG CAPS capsule TAKE 1 CAPSULE BY MOUTH TWICE DAILY START TREATMENT 10 HRS LAST DOSE OF LOVENOX (Patient taking differently: Take 150 mg by  mouth 2 (two) times daily.) 60 capsule 2 12/15/2021 at 0800   acetaminophen (TYLENOL) 500 MG tablet Take 500 mg by mouth every 6 (six) hours as needed.   prn at prn   Blood Glucose Monitoring Suppl (FIFTY50 GLUCOSE METER 2.0) w/Device KIT See admin instructions. (Patient not taking: Reported on 01/22/2021)      Dentifrices (BIOTENE DRY MOUTH DT) by Transmucosal route.   prn at prn   loratadine (CLARITIN) 10 MG tablet Take 10 mg by mouth daily.  (Patient not taking: Reported on 01/22/2021)      nitrofurantoin, macrocrystal-monohydrate, (MACROBID) 100 MG capsule Take 100 mg by mouth 2 (two) times daily. (Patient not taking: Reported on 12/15/2021)   Not Taking   nystatin (MYCOSTATIN/NYSTOP) powder Apply 1 application  topically. Apply to right groin daily   prn at prn   tiZANidine (ZANAFLEX) 2 MG tablet Take 2 mg by mouth every 12 (twelve) hours as needed for muscle spasms.   prn at prn   Social History   Socioeconomic History   Marital status: Divorced    Spouse name: Not on file   Number of children: Not on file   Years of education: Not on file   Highest education level: Not on file  Occupational History   Occupation: Pharmacist, hospital    Comment: retired  Tobacco Use   Smoking status: Never   Smokeless tobacco: Never  Vaping Use   Vaping Use: Never used  Substance and Sexual Activity   Alcohol use: No   Drug use: No   Sexual activity: Not Currently  Other Topics Concern   Not on file  Social History Narrative   Patient lives at [facility]   Social Determinants of Health   Financial Resource Strain: Not on file  Food Insecurity: Not on file  Transportation Needs: Not on file  Physical Activity: Not on file  Stress: Not on file  Social Connections: Not on file  Intimate Partner Violence: Not on file    Family History  Problem Relation Age of Onset   Breast cancer Paternal Aunt    Ovarian cancer Sister    Prostate cancer  Father    Stroke Mother    Kidney  cancer Neg Hx    Bladder Cancer Neg Hx       PHYSICAL EXAM General: Elderly and ill-appearing Caucasian female, well nourished, in no acute distress.  Sitting upright in hospital bed HEENT:  Normocephalic and atraumatic. Neck:  No JVD.  Lungs: Normal respiratory effort on 4 L by [place].  Bibasilar rhonchi Heart: Tachycardic irregularly irregular rhythm.   Abdomen: Non-distended appearing.  Msk: Normal strength and tone for age. Extremities: S/p left AKA, right lower extremity with trace edema neuro: Alert and oriented X 3. Psych:  Answers questions appropriately but is a poor historian..   Labs:   Lab Results  Component Value Date   WBC 8.6 12/17/2021   HGB 10.9 (L) 12/17/2021   HCT 36.4 12/17/2021   MCV 81.3 12/17/2021   PLT 125 (L) 12/17/2021    Recent Labs  Lab 12/17/21 0533  NA 136  K 3.5  CL 109  CO2 20*  BUN 28*  CREATININE 0.61  CALCIUM 9.5  PROT 6.0*  BILITOT 0.5  ALKPHOS 45  ALT 26  AST 25  GLUCOSE 199*    No results found for: "CKTOTAL", "CKMB", "CKMBINDEX", "TROPONINI"  Lab Results  Component Value Date   CHOL 110 12/15/2021   Lab Results  Component Value Date   HDL 19 (L) 12/15/2021   Lab Results  Component Value Date   LDLCALC 46 12/15/2021   Lab Results  Component Value Date   TRIG 223 (H) 12/15/2021   Lab Results  Component Value Date   CHOLHDL 5.8 12/15/2021   No results found for: "LDLDIRECT"    Radiology: ECHOCARDIOGRAM COMPLETE  Result Date: 12/17/2021    ECHOCARDIOGRAM REPORT   Patient Name:   Julie Acosta Date of Exam: 12/16/2021 Medical Rec #:  [id number]       Height:       60.0 in Accession #:    [id number]      Weight:       177.0 lb Date of Birth:  April 25, 1934      BSA:          1.772 m Patient Age:    73 years        BP:           110/47 mmHg Patient Gender: F               HR:           95 bpm. Exam Location:  [facility] Procedure: 2D Echo, Cardiac Doppler and Color Doppler Indications:     Elevated Troponin  History:          Patient has prior history of Echocardiogram examinations, most                  recent 07/25/2019. Previous Myocardial Infarction, PAD and                  History of breast cancer; Risk Factors:Diabetes, Dyslipidemia                  and Hypertension.  Sonographer:     Rosalia Hammers Referring Phys:  [id number] AGBATA Diagnosing Phys: Isaias Cowman MD  Sonographer Comments: Suboptimal apical window and suboptimal subcostal window. Image acquisition challenging due to patient body habitus. IMPRESSIONS  1. Left ventricular ejection fraction, by estimation, is 60 to 65%. The left ventricle has normal function. The left ventricle has no regional wall motion abnormalities.  Left ventricular diastolic parameters were normal.  2. Right ventricular systolic function is normal. The right ventricular size is normal.  3. The mitral valve is normal in structure. Mild mitral valve regurgitation. No evidence of mitral stenosis.  4. The aortic valve is normal in structure. Aortic valve regurgitation is mild. Aortic valve sclerosis is present, with no evidence of aortic valve stenosis.  5. The inferior vena cava is normal in size with greater than 50% respiratory variability, suggesting right atrial pressure of 3 mmHg. FINDINGS  Left Ventricle: Left ventricular ejection fraction, by estimation, is 60 to 65%. The left ventricle has normal function. The left ventricle has no regional wall motion abnormalities. The left ventricular internal cavity size was normal in size. There is  no left ventricular hypertrophy. Left ventricular diastolic parameters were normal. Right Ventricle: The right ventricular size is normal. No increase in right ventricular wall thickness. Right ventricular systolic function is normal. Left Atrium: Left atrial size was normal in size. Right Atrium: Right atrial size was normal in size. Pericardium: There is no evidence of pericardial effusion. Mitral Valve: The mitral valve is normal in structure.  Mild mitral valve regurgitation. No evidence of mitral valve stenosis. Tricuspid Valve: The tricuspid valve is normal in structure. Tricuspid valve regurgitation is mild . No evidence of tricuspid stenosis. Aortic Valve: The aortic valve is normal in structure. Aortic valve regurgitation is mild. Aortic valve sclerosis is present, with no evidence of aortic valve stenosis. Aortic valve mean gradient measures 2.0 mmHg. Aortic valve peak gradient measures 4.2  mmHg. Aortic valve area, by VTI measures 2.65 cm. Pulmonic Valve: The pulmonic valve was normal in structure. Pulmonic valve regurgitation is not visualized. No evidence of pulmonic stenosis. Aorta: The aortic root is normal in size and structure. Venous: The inferior vena cava is normal in size with greater than 50% respiratory variability, suggesting right atrial pressure of 3 mmHg. IAS/Shunts: No atrial level shunt detected by color flow Doppler.  LEFT VENTRICLE PLAX 2D LVIDd:         3.73 cm   Diastology LVIDs:         2.53 cm   LV e' medial:    5.55 cm/s LV PW:         1.16 cm   LV E/e' medial:  15.2 LV IVS:        1.21 cm   LV e' lateral:   4.68 cm/s LVOT diam:     1.80 cm   LV E/e' lateral: 18.0 LV SV:         46 LV SV Index:   26 LVOT Area:     2.54 cm  RIGHT VENTRICLE RV Basal diam:  3.22 cm RV S prime:     13.30 cm/s TAPSE (M-mode): 1.9 cm LEFT ATRIUM           Index        RIGHT ATRIUM           Index LA diam:      3.80 cm 2.14 cm/m   RA Area:     14.50 cm LA Vol (A4C): 43.2 ml 24.38 ml/m  RA Volume:   35.30 ml  19.92 ml/m  AORTIC VALVE AV Area (Vmax):    1.99 cm AV Area (Vmean):   2.09 cm AV Area (VTI):     2.65 cm AV Vmax:           102.00 cm/s AV Vmean:  68.200 cm/s AV VTI:            0.172 m AV Peak Grad:      4.2 mmHg AV Mean Grad:      2.0 mmHg LVOT Vmax:         79.60 cm/s LVOT Vmean:        56.000 cm/s LVOT VTI:          0.179 m LVOT/AV VTI ratio: 1.04  AORTA Ao Root diam: 2.70 cm MITRAL VALVE MV Area (PHT): 4.96 cm      SHUNTS MV Decel Time: 153 msec     Systemic VTI:  0.18 m MV E velocity: 84.40 cm/s   Systemic Diam: 1.80 cm MV A velocity: 108.00 cm/s MV E/A ratio:  0.78 Isaias Cowman MD Electronically signed by Isaias Cowman MD Signature Date/Time: 12/17/2021/9:17:52 AM    Final    CT Angio Chest PE W and/or Wo Contrast  Result Date: 12/15/2021 CLINICAL DATA:  Pulmonary embolism suspected, high probability. Abdominal pain, acute, nonlocalized. Hypoxia EXAM: CT ANGIOGRAPHY CHEST CT ABDOMEN AND PELVIS WITH CONTRAST TECHNIQUE: Multidetector CT imaging of the chest was performed using the standard protocol during bolus administration of intravenous contrast. Multiplanar CT image reconstructions and MIPs were obtained to evaluate the vascular anatomy. Multidetector CT imaging of the abdomen and pelvis was performed using the standard protocol during bolus administration of intravenous contrast. RADIATION DOSE REDUCTION: This exam was performed according to the departmental dose-optimization program which includes automated exposure control, adjustment of the mA and/or kV according to patient size and/or use of iterative reconstruction technique. CONTRAST:  12mL OMNIPAQUE IOHEXOL 350 MG/ML SOLN COMPARISON:  CT chest with contrast 07/23/2019 scratched at CT of the chest, abdomen and pelvis with contrast 07/23/2019 FINDINGS: CTA CHEST FINDINGS Cardiovascular: Heart is enlarged. Atherosclerotic calcifications are present at the aortic arch and great vessel origins. New significant stenosis or aneurysm is present. Pulmonary artery opacification is excellent. No focal filling defects are present to suggest pulmonary emboli. Pulmonary arteries are enlarged. Mediastinum/Nodes: No enlarged mediastinal, hilar, or axillary lymph nodes. Thyroid gland, trachea, and esophagus demonstrate no significant findings. Lungs/Pleura: The right main pulmonary artery measures up to 25 mm. Left main pulmonary artery measures up to 26 mm. Right  middle lobe and right lower lobe pulmonary nodules on images 40 and 41 of series 6 are stable. Mild dependent airspace disease likely reflects atelectasis. Asymmetric dependent airspace disease is present at the left base. Some asymmetric airspace disease is noted posteriorly in the right upper lobe is well. No significant pleural effusion or pneumothorax is present. Musculoskeletal: No chest wall abnormality. No acute or significant osseous findings. Review of the MIP images confirms the above findings. CT ABDOMEN and PELVIS FINDINGS Hepatobiliary: No focal liver abnormality is seen. Status post cholecystectomy. No biliary dilatation. Pancreas: Unremarkable. No pancreatic ductal dilatation or surrounding inflammatory changes. Spleen: Normal in size without focal abnormality. Adrenals/Urinary Tract: 2 simple cysts are present in the left kidney. The larger measures 10 mm. 5 mm nonobstructing stone is present in the interpolar region of the right kidney. A 7 mm nonobstructing stone is present near the upper pole of the left kidney. Two 5 mm stones are present in the upper and lower poles of the left kidney respectively. At least 2 other punctate nonobstructing stones are present near the upper pole of the left kidney. No solid mass lesion is present. Ureters are within normal limits bilaterally. No obstruction is present. The urinary bladder is within normal limits.  Stomach/Bowel: The stomach and duodenum are within normal limits. Small bowel is unremarkable. Terminal ileum is normal. The appendix is visualized and within normal limits. The ascending and transverse colon are within normal limits. Diverticular changes are present the distal descending and sigmoid colon. No inflammatory changes are present to suggest diverticulitis. Vascular/Lymphatic: Atherosclerotic calcifications are present in the aorta and branch vessels. No aneurysm is present. Reproductive: 21 mm left adnexal cyst is stable. Uterus and adnexa  are otherwise unremarkable. Other: Choose 1 Musculoskeletal: Left hemipelvectomy stable. Degenerative changes are noted at the right SI joint. No focal osseous lesions are present. Degenerative changes are present in the lower lumbar spine. Review of the MIP images confirms the above findings. IMPRESSION: 1. No pulmonary embolus. 2. Asymmetric airspace disease at the left base and posteriorly in the right upper lobe concerning for pneumonia. 3. Cardiomegaly with enlargement of the main pulmonary arteries suggesting pulmonary arterial hypertension. 4. Stable right middle lobe and right lower lobe pulmonary nodules. 5. Bilateral nonobstructing renal stones. 6. Descending and sigmoid diverticulosis without evidence for diverticulitis. 7. Stable 21 mm left adnexal cyst. No follow-up imaging recommended. Note: This recommendation does not apply to premenarchal patients and to those with increased risk (genetic, family history, elevated tumor markers or other high-risk factors) of ovarian cancer. Reference: JACR 2020 Feb; 17(2):248-254 8. Degenerative changes in the lower lumbar spine. 9. Aortic Atherosclerosis (ICD10-I70.0). Electronically Signed   By: San Morelle M.D.   On: 12/15/2021 12:17   CT ABDOMEN PELVIS W CONTRAST  Result Date: 12/15/2021 CLINICAL DATA:  Pulmonary embolism suspected, high probability. Abdominal pain, acute, nonlocalized. Hypoxia EXAM: CT ANGIOGRAPHY CHEST CT ABDOMEN AND PELVIS WITH CONTRAST TECHNIQUE: Multidetector CT imaging of the chest was performed using the standard protocol during bolus administration of intravenous contrast. Multiplanar CT image reconstructions and MIPs were obtained to evaluate the vascular anatomy. Multidetector CT imaging of the abdomen and pelvis was performed using the standard protocol during bolus administration of intravenous contrast. RADIATION DOSE REDUCTION: This exam was performed according to the departmental dose-optimization program which  includes automated exposure control, adjustment of the mA and/or kV according to patient size and/or use of iterative reconstruction technique. CONTRAST:  35mL OMNIPAQUE IOHEXOL 350 MG/ML SOLN COMPARISON:  CT chest with contrast 07/23/2019 scratched at CT of the chest, abdomen and pelvis with contrast 07/23/2019 FINDINGS: CTA CHEST FINDINGS Cardiovascular: Heart is enlarged. Atherosclerotic calcifications are present at the aortic arch and great vessel origins. New significant stenosis or aneurysm is present. Pulmonary artery opacification is excellent. No focal filling defects are present to suggest pulmonary emboli. Pulmonary arteries are enlarged. Mediastinum/Nodes: No enlarged mediastinal, hilar, or axillary lymph nodes. Thyroid gland, trachea, and esophagus demonstrate no significant findings. Lungs/Pleura: The right main pulmonary artery measures up to 25 mm. Left main pulmonary artery measures up to 26 mm. Right middle lobe and right lower lobe pulmonary nodules on images 40 and 41 of series 6 are stable. Mild dependent airspace disease likely reflects atelectasis. Asymmetric dependent airspace disease is present at the left base. Some asymmetric airspace disease is noted posteriorly in the right upper lobe is well. No significant pleural effusion or pneumothorax is present. Musculoskeletal: No chest wall abnormality. No acute or significant osseous findings. Review of the MIP images confirms the above findings. CT ABDOMEN and PELVIS FINDINGS Hepatobiliary: No focal liver abnormality is seen. Status post cholecystectomy. No biliary dilatation. Pancreas: Unremarkable. No pancreatic ductal dilatation or surrounding inflammatory changes. Spleen: Normal in size without focal abnormality. Adrenals/Urinary Tract:  2 simple cysts are present in the left kidney. The larger measures 10 mm. 5 mm nonobstructing stone is present in the interpolar region of the right kidney. A 7 mm nonobstructing stone is present near the  upper pole of the left kidney. Two 5 mm stones are present in the upper and lower poles of the left kidney respectively. At least 2 other punctate nonobstructing stones are present near the upper pole of the left kidney. No solid mass lesion is present. Ureters are within normal limits bilaterally. No obstruction is present. The urinary bladder is within normal limits. Stomach/Bowel: The stomach and duodenum are within normal limits. Small bowel is unremarkable. Terminal ileum is normal. The appendix is visualized and within normal limits. The ascending and transverse colon are within normal limits. Diverticular changes are present the distal descending and sigmoid colon. No inflammatory changes are present to suggest diverticulitis. Vascular/Lymphatic: Atherosclerotic calcifications are present in the aorta and branch vessels. No aneurysm is present. Reproductive: 21 mm left adnexal cyst is stable. Uterus and adnexa are otherwise unremarkable. Other: Choose 1 Musculoskeletal: Left hemipelvectomy stable. Degenerative changes are noted at the right SI joint. No focal osseous lesions are present. Degenerative changes are present in the lower lumbar spine. Review of the MIP images confirms the above findings. IMPRESSION: 1. No pulmonary embolus. 2. Asymmetric airspace disease at the left base and posteriorly in the right upper lobe concerning for pneumonia. 3. Cardiomegaly with enlargement of the main pulmonary arteries suggesting pulmonary arterial hypertension. 4. Stable right middle lobe and right lower lobe pulmonary nodules. 5. Bilateral nonobstructing renal stones. 6. Descending and sigmoid diverticulosis without evidence for diverticulitis. 7. Stable 21 mm left adnexal cyst. No follow-up imaging recommended. Note: This recommendation does not apply to premenarchal patients and to those with increased risk (genetic, family history, elevated tumor markers or other high-risk factors) of ovarian cancer. Reference:  JACR 2020 Feb; 17(2):248-254 8. Degenerative changes in the lower lumbar spine. 9. Aortic Atherosclerosis (ICD10-I70.0). Electronically Signed   By: San Morelle M.D.   On: 12/15/2021 12:17   DG Chest Port 1 View  Result Date: 12/15/2021 CLINICAL DATA:  88 year old female with possible sepsis. EXAM: PORTABLE CHEST 1 VIEW COMPARISON:  Chest x-ray 10/02/2017. FINDINGS: Lung volumes are low. Blunting of the left costophrenic sulcus. Opacity at the left base which may reflect atelectasis and/or consolidation. Diffuse interstitial prominence and peribronchial cuffing, concerning for an acute bronchitis. No pneumothorax. Heart size is upper limits of normal. The patient is rotated to the left on today's exam, resulting in distortion of the mediastinal contours and reduced diagnostic sensitivity and specificity for mediastinal pathology. IMPRESSION: 1. Findings are concerning for severe acute bronchitis, likely with developing left lower lobe bronchopneumonia and small parapneumonic pleural effusion. Followup PA and lateral chest X-ray is recommended in 3-4 weeks following trial of antibiotic therapy to ensure resolution and exclude underlying malignancy. Electronically Signed   By: Vinnie Langton M.D.   On: 12/15/2021 10:29    ECHO 07/24/2019  1. Left ventricular ejection fraction, by estimation, is 55 to 60%. The  left ventricle has normal function. The left ventricle has no regional  wall motion abnormalities.   2. Right ventricular systolic function is normal. The right ventricular  size is normal.   3. Left atrial size was mild to moderately dilated. No left atrial/left  atrial appendage thrombus was detected.   4. The mitral valve is normal in structure. Mild to moderate mitral valve  regurgitation.   5. The  aortic valve is normal in structure. Aortic valve regurgitation is  trivial.   6. There is mild (Grade II) plaque.   FINDINGS   Left Ventricle: Left ventricular ejection fraction,  by estimation, is 55  to 60%. The left ventricle has normal function. The left ventricle has no  regional wall motion abnormalities. The left ventricular internal cavity  size was normal in size. There is   no left ventricular hypertrophy.   Right Ventricle: The right ventricular size is normal. No increase in  right ventricular wall thickness. Right ventricular systolic function is  normal.   Left Atrium: Left atrial size was mild to moderately dilated. No left  atrial/left atrial appendage thrombus was detected.   Right Atrium: Right atrial size was normal in size.   Pericardium: There is no evidence of pericardial effusion.   Mitral Valve: The mitral valve is normal in structure. Mild to moderate  mitral valve regurgitation. There is no evidence of mitral valve  vegetation.   Tricuspid Valve: The tricuspid valve is normal in structure. Tricuspid  valve regurgitation is mild. There is no evidence of tricuspid valve  vegetation.   Aortic Valve: The aortic valve is normal in structure. Aortic valve  regurgitation is trivial. There is no evidence of aortic valve vegetation.   Pulmonic Valve: The pulmonic valve was grossly normal. Pulmonic valve  regurgitation is not visualized.   Aorta: The aortic root and ascending aorta are structurally normal, with  no evidence of dilitation. There is mild (Grade II) plaque.   IAS/Shunts: There is right bowing of the interatrial septum, suggestive of  elevated left atrial pressure. No atrial level shunt detected by color  flow Doppler.   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  She was hypoxic to 78%, on room air, requiring 6 L of supplemental oxygen.  CTA chest negative for PE but concerning for pneumonia.  She met sepsis criteria with leukocytosis to 16, tachycardia to the 110s, and elevated lactate.  Cardiology is consulted due to her elevated troponin. She converted to AF with RVR the early morning of 8/15 in the setting of hypomagnesemia, abnormally low TSH, and ongoing treatment for pneumonia.  #New onset paroxysmal atrial fibrillation with RVR #Low TSH #Hypomagnesemia Noted on telemetry early morning hours of 8/15, heart rate in the 120s to 130s patient asymptomatic.  Notably, her TSH is extremely low at <0.010 with T3 and T4 pending.  Magnesium also low at 1.5, now being repleted.  She denies a history of a thyroid disorder. -Monitor and replete electrolytes -Continuous monitoring on telemetry -Ordered metoprolol tartrate 12.5 mg 3 times daily for rate control, increase with goal heart rate high 90s to low 100s. -Continue Pradaxa 150 mg twice daily for stroke risk reduction, and with her history of factor V.  CHA2DS2-VASc 4 (age, sex, DM)   #Sepsis 2/2 HCAP #E coli bacteremia  #Acute hypoxic respiratory failure #Elevated troponin  #factor V leiden  Patient  presents with a several day history of poor appetite and "just not feeling well" and was hypoxic on room air to 78% initially requiring 6L of HFNC. She is being treated empirically for pneumonia with azithromycin and blood cultures are positive for ecoli, currently on ceftriaxone.  A troponin was checked on admission which was initially elevated at 475 and downtrending thereafter to 342 most recently.  In the absence of chest pain or significant EKG changes, this likely represents demand ischemia and not  ACS. -agree with current therapy per primary team  -s/p 325 mg aspirin, continue 81 mg aspirin daily in addition to Pradaxa 150 twice daily -Defer heparin drip -Change metoprolol XL to short acting metoprolol tartrate 12.5 mg 3 times daily for new onset atrial fibrillation for rate control as above -Will give a one-time dose of IV Lasix 40 mg as her BNP was slightly elevated at 300, monitor response and possibly repeat this tomorrow -Instructed nursing to provide incentive spirometer -Echocardiogram complete resulted with preserved LVEF as above. -Recommend conservative input from a cardiac standpoint  This patient's plan of care was discussed and created with Dr. Saralyn Pilar and he is in agreement.  Signed: Tristan Schroeder , PA-C 12/17/2021, 10:27 AM [facility]

## 2021-12-17 NOTE — Assessment & Plan Note (Signed)
New onset Patient has a CHA2DS2-VASc score of 4 and ideally requires long-term anticoagulation as primary prophylaxis for an acute stroke Continue metoprolol for rate control Continue Pradaxa Noted to have a low TSH level Awaiting results of free T3 and T4

## 2021-12-17 NOTE — Progress Notes (Signed)
       CROSS COVER NOTE  NAME: Julie Acosta MRN: [id number] DOB : 10/18/1933    Date of Service   12/17/21  HPI/Events of Note   Contacted by nursing to report HR in the 120s and 130s that appears irregular on the monitor.  Interventions   Plan:  EKG- AFIB w RVR HR 124  New Onset AFIB Continue Pradaxa 5 mg IV metoprolol PRN with hold parameters Cardiology is already consulted and following the case CBC, CMP, TSH, Mg      This document was prepared using Dragon voice recognition software and may include unintentional dictation errors.  Neomia Glass DNP, MHA, FNP-BC Nurse Practitioner [facility] Pager [phone]

## 2021-12-18 ENCOUNTER — Inpatient Hospital Stay: Payer: Medicare Other

## 2021-12-18 DIAGNOSIS — J189 Pneumonia, unspecified organism: Secondary | ICD-10-CM | POA: Diagnosis not present

## 2021-12-18 LAB — GLUCOSE, CAPILLARY
Glucose-Capillary: 158 mg/dL — ABNORMAL HIGH (ref 70–99)
Glucose-Capillary: 153 mg/dL — ABNORMAL HIGH (ref 70–99)
Glucose-Capillary: 158 mg/dL — ABNORMAL HIGH (ref 70–99)
Glucose-Capillary: 203 mg/dL — ABNORMAL HIGH (ref 70–99)

## 2021-12-18 LAB — CULTURE, BLOOD (ROUTINE X 2)

## 2021-12-18 LAB — T3, FREE: T3, Free: 1.9 pg/mL — ABNORMAL LOW (ref 2.0–4.4)

## 2021-12-18 LAB — MAGNESIUM: Magnesium: 1.8 mg/dL (ref 1.7–2.4)

## 2021-12-18 MED ORDER — ORAL CARE MOUTH RINSE: 15.0000 mL | OROMUCOSAL | Status: DC | PRN

## 2021-12-18 MED ORDER — FUROSEMIDE 10 MG/ML IJ SOLN
40.0000 mg | Freq: Once | INTRAMUSCULAR | Status: AC
Start: 2021-12-18 — End: 2021-12-18
  Administered 2021-12-18: 40 mg via INTRAVENOUS
  Filled 2021-12-18: qty 4

## 2021-12-18 MED ORDER — POTASSIUM CHLORIDE CRYS ER 20 MEQ PO TBCR
40.0000 meq | EXTENDED_RELEASE_TABLET | Freq: Once | ORAL | Status: AC
  Administered 2021-12-18: 40 meq via ORAL
  Filled 2021-12-18: qty 2

## 2021-12-18 MED ORDER — METOPROLOL TARTRATE 25 MG PO TABS
25.0000 mg | ORAL_TABLET | Freq: Three times a day (TID) | ORAL | Status: DC
  Administered 2021-12-18 – 2021-12-19 (×4): 25 mg via ORAL
  Filled 2021-12-18 (×4): qty 1

## 2021-12-18 MED ORDER — CEFADROXIL 500 MG PO CAPS
1000.0000 mg | ORAL_CAPSULE | Freq: Two times a day (BID) | ORAL | Status: DC
  Administered 2021-12-18 – 2021-12-19 (×2): 1000 mg via ORAL
  Filled 2021-12-18 (×2): qty 2

## 2021-12-18 NOTE — Evaluation (Signed)
Physical Therapy Evaluation Patient Details Name: Julie Acosta MRN: [id number] DOB: 04-Jun-1933 Today's Date: 12/18/2021  History of Present Illness  Patient is a 88 year old  female with medical history significant of breast cancer, anxiety, left leg sarcoma status post left hemipelvectomy and amputation, DVT, hypertension, hyperlipidemia, diabetes mellitus, diverticulitis, obesity, PVD, who presents with weakness. Found to have healthcare-associated pneumonia, acute respiratory failure with hypoxia, severe sepsis.  Clinical Impression  Patient is agreeable to PT evaluation. She reports she lives at [facility] and has assistance to get out of bed to her wheelchair at baseline. The patient needs +2 person assistance for bed mobility this session. She has some confusion but is able to follow single step commands consistently. She is on 3 L02 with Sp02 in the 90's and heart rate up to the 130's with activity. Patient has generalized weakness and currently needs assistance with all mobility. Recommend PT follow up to maximize independence and decrease caregiver burden. SNF recommended at discharge.      Recommendations for follow up therapy are one component of a multi-disciplinary discharge planning process, led by the attending physician.  Recommendations may be updated based on patient status, additional functional criteria and insurance authorization.  Follow Up Recommendations Skilled nursing-short term rehab (<3 hours/day) Can patient physically be transported by private vehicle: No    Assistance Recommended at Discharge Intermittent Supervision/Assistance  Patient can return home with the following  Two people to help with walking and/or transfers;A lot of help with bathing/dressing/bathroom;Help with stairs or ramp for entrance;Assist for transportation;Assistance with cooking/housework    Equipment Recommendations None recommended by PT  Recommendations for Other Services        Functional Status Assessment Patient has had a recent decline in their functional status and demonstrates the ability to make significant improvements in function in a reasonable and predictable amount of time.     Precautions / Restrictions Precautions Precautions: Fall Restrictions Weight Bearing Restrictions: No      Mobility  Bed Mobility Overal bed mobility: Needs Assistance Bed Mobility: Supine to Sit, Sit to Supine     Supine to sit: Max assist, +2 for physical assistance Sit to supine: Max assist, +2 for physical assistance   General bed mobility comments: verbal cues for task initiation and sequencing. assistance for trunk and BLE support    Transfers                   General transfer comment: heart rate up to 130bpm with seated level activity. transfers not attempted this date.    Ambulation/Gait                  Stairs            Wheelchair Mobility    Modified Rankin (Stroke Patients Only)       Balance Overall balance assessment: Needs assistance Sitting-balance support: Single extremity supported Sitting balance-Leahy Scale: Fair                                       Pertinent Vitals/Pain Pain Assessment Pain Assessment: Faces Faces Pain Scale: Hurts little more Pain Location: L hip area with mobility Pain Descriptors / Indicators: Grimacing Pain Intervention(s): Limited activity within patient's tolerance, Monitored during session, Repositioned    Home Living Family/patient expects to be discharged to:: Skilled nursing facility [facility])  Prior Function Prior Level of Function : Needs assist       Physical Assist : Mobility (physical);ADLs (physical) Mobility (physical): Bed mobility;Transfers ADLs (physical): Bathing;Dressing;Toileting;IADLs Mobility Comments: patient reports performing squat pivot transfer from bed to wheelchair and sitting in motorized wheelchair  for the whole day usually ADLs Comments: assistance required     Hand Dominance        Extremity/Trunk Assessment   Upper Extremity Assessment Upper Extremity Assessment: Generalized weakness    Lower Extremity Assessment Lower Extremity Assessment: LLE deficits/detail (generalized weakness RLE) LLE Deficits / Details: history of hemipelvectomy    Cervical / Trunk Assessment Cervical / Trunk Assessment:  (L hip obliquity with scoli noted in the lumbar spine to the L)  Communication   Communication: No difficulties  Cognition Arousal/Alertness: Awake/alert Behavior During Therapy: WFL for tasks assessed/performed Overall Cognitive Status: Impaired/Different from baseline Area of Impairment: Attention, Memory, Following commands, Safety/judgement, Awareness, Problem solving, Orientation                 Orientation Level: Disoriented to, Place, Time, Situation Current Attention Level: Focused Memory: Decreased short-term memory Following Commands: Follows one step commands with increased time Safety/Judgement: Decreased awareness of safety Awareness: Emergent Problem Solving: Slow processing, Requires verbal cues, Requires tactile cues          General Comments General comments (skin integrity, edema, etc.): Sp02 95% on 3 L02 with activity. bed placed in semi-chair position at end of session to promote upright conditioning and activity tolerance    Exercises     Assessment/Plan    PT Assessment Patient needs continued PT services  PT Problem List Decreased strength;Decreased range of motion;Decreased activity tolerance;Decreased balance;Decreased mobility;Decreased cognition;Decreased safety awareness       PT Treatment Interventions DME instruction;Functional mobility training;Therapeutic activities;Therapeutic exercise;Neuromuscular re-education;Balance training;Cognitive remediation;Patient/family education;Wheelchair mobility training    PT Goals (Current  goals can be found in the Care Plan section)  Acute Rehab PT Goals Patient Stated Goal: return to [facility] PT Goal Formulation: With patient Time For Goal Achievement: 01/01/22 Potential to Achieve Goals: Fair    Frequency Min 2X/week     Co-evaluation PT/OT/SLP Co-Evaluation/Treatment: Yes Reason for Co-Treatment: Complexity of the patient's impairments (multi-system involvement);Necessary to address cognition/behavior during functional activity;For patient/therapist safety PT goals addressed during session: Mobility/safety with mobility OT goals addressed during session: ADL's and self-care       AM-PAC PT "6 Clicks" Mobility  Outcome Measure Help needed turning from your back to your side while in a flat bed without using bedrails?: A Lot Help needed moving from lying on your back to sitting on the side of a flat bed without using bedrails?: Total Help needed moving to and from a bed to a chair (including a wheelchair)?: Total Help needed standing up from a chair using your arms (e.g., wheelchair or bedside chair)?: Total Help needed to walk in hospital room?: Total Help needed climbing 3-5 steps with a railing? : Total 6 Click Score: 7    End of Session Equipment Utilized During Treatment: Oxygen Activity Tolerance: Patient tolerated treatment well Patient left: in bed;with call bell/phone within reach;with bed alarm set;with nursing/sitter in room (nurse and a nurse student in the room) Nurse Communication: Mobility status PT Visit Diagnosis: Muscle weakness (generalized) (M62.81);Unsteadiness on feet (R26.81)    Time: 5284-1324 PT Time Calculation (min) (ACUTE ONLY): 23 min   Charges:   PT Evaluation $PT Eval Moderate Complexity: 1 Mod        Olivia Mackie  Quentin Cornwall, PT, MPT   Percell Locus 12/18/2021, 1:03 PM

## 2021-12-18 NOTE — Progress Notes (Signed)
[name] NOTE       Patient ID: Julie Acosta MRN: [id number] DOB/AGE: 1934/03/26 88 y.o.  Admit date: 12/15/2021 Referring Physician Dr. Francine Graven Primary Physician Dr. 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  She was hypoxic to 78%, on room air, requiring 6 L of supplemental oxygen.  CTA chest negative for PE but concerning for pneumonia.  She met sepsis criteria with leukocytosis to 16, tachycardia to the 110s, and elevated lactate.  Cardiology is consulted due to her elevated troponin. She converted to AF with RVR the early morning of 8/15 in the setting of hypomagnesemia, abnormally low TSH, and ongoing treatment for pneumonia.  Interval History: -Remains in atrial fibrillation/flutter with HR mostly in the low 100s to 100s -Oxygen weaned from 4L to 3L today -No complaints.  Denies chest pain, palpitations, shortness of breath.  Questions how to use the incentive spirometer. -Intermittent confusion, requiring safety mitts overnight to keep nasal cannula and IVs in place.   Review of systems complete and found to be negative unless listed above   Past Medical History:  Diagnosis Date   Anxiety    Benign breast lumps    Blood clotting disorder (HCC)    Bone cancer (HCC)    head of femur, left leg ... amputation at age 13   Breast cancer of upper-outer quadrant of left female breast ([place]) 07/27/2017   11 mm invasive mammary carcinoma, ER 90%, PR 51-90%, negative margins.  Oncotype recurrence score: 1.  DCIS, margin less than 0.5 mm.  Negative sentinel node.  Accelerated partial  breast radiation.   Cervicalgia    Chronic kidney disease    kidney stones   Colon cancer [facility])    patient unaware of this   Complication of anesthesia    mood alteration / not sure if d/t pain medicine or anesthesia as she passed out    Cough    NASAL DRIP / SNEEZING / SORE THROAT MOSTLY CONSTANT   Depression    Diabetes ([place])    Diverticulitis    Dizzy    GERD (gastroesophageal reflux disease)    H/O blood clots    arm and leg   HBP (high blood pressure)    Hematuria    gross   Hemorrhoids    HLD (hyperlipidemia)    Muscle pain    Osteoarthritis    Osteoporosis    Panic disorder    Personal history of radiation therapy    PND (post-nasal drip)    CHRONIC WITH SORE THROAT AND SNEEZING   Reflux    Skin cancer    nose   Swelling    Tremor    Tremors of nervous system     Past Surgical History:  Procedure Laterality Date   APPENDECTOMY  1962   BREAST BIOPSY Right 2006?   benign   BREAST BIOPSY Left 2019   invasive mammary carcinoma   BREAST CYST EXCISION Left 07/27/2017   Procedure: SKIN CYST EXCISED;  Surgeon: Robert Bellow, MD;  Location: [facility] ORS;  Service: General;  Laterality: Left;   BREAST LUMPECTOMY Left 07/2017   invasive mammary, DCIS  mammosite  BREAST SURGERY Right 2019   CARPAL TUNNEL RELEASE Right    CATARACT EXTRACTION W/PHACO Left 11/11/2016   Procedure: CATARACT EXTRACTION PHACO AND INTRAOCULAR LENS PLACEMENT (IOC);  Surgeon: Birder Robson, MD;  Location: [facility] ORS;  Service: Ophthalmology;  Laterality: Left;  [place] 01:07.9 AP% 19.2 CDE 13.02 Fluid pack lot # [id number]   CATARACT EXTRACTION W/PHACO Right 12/09/2016   Procedure: CATARACT EXTRACTION PHACO AND INTRAOCULAR LENS PLACEMENT (IOC);  Surgeon: Birder Robson, MD;  Location: [facility] ORS;  Service: Ophthalmology;  Laterality: Right;  [place] 00:49 AP% 21.2 CDE 10.39 Fluid pack lot # [id number]   CHOLECYSTECTOMY     COLONOSCOPY WITH PROPOFOL N/A 11/22/2014   Procedure: COLONOSCOPY WITH  PROPOFOL;  Surgeon: Manya Silvas, MD;  Location: [facility] ENDOSCOPY;  Service: Endoscopy;  Laterality: N/A;   CYSTOSCOPY WITH STENT PLACEMENT Bilateral 11/27/2018   Procedure: CYSTOSCOPY WITH STENT PLACEMENT;  Surgeon: Festus Aloe, MD;  Location: [facility] ORS;  Service: Urology;  Laterality: Bilateral;   CYSTOSCOPY/URETEROSCOPY/HOLMIUM LASER/STENT PLACEMENT Bilateral 12/17/2018   Procedure: CYSTOSCOPY/URETEROSCOPY/HOLMIUM LASER/STENT Exchange;  Surgeon: Billey Co, MD;  Location: [facility] ORS;  Service: Urology;  Laterality: Bilateral;   ESOPHAGOGASTRODUODENOSCOPY  11/22/2014   Procedure: ESOPHAGOGASTRODUODENOSCOPY (EGD);  Surgeon: Manya Silvas, MD;  Location: [facility] ENDOSCOPY;  Service: Endoscopy;;   ESOPHAGOGASTRODUODENOSCOPY (EGD) WITH PROPOFOL N/A 03/05/2017   Procedure: ESOPHAGOGASTRODUODENOSCOPY (EGD) WITH PROPOFOL;  Surgeon: Lin Landsman, MD;  Location: [facility] ENDOSCOPY;  Service: Gastroenterology;  Laterality: N/A;   EXTRACORPOREAL SHOCK WAVE LITHOTRIPSY Right 03/04/2018   Procedure: EXTRACORPOREAL SHOCK WAVE LITHOTRIPSY (ESWL);  Surgeon: Billey Co, MD;  Location: [facility] ORS;  Service: Urology;  Laterality: Right;   EYE SURGERY Bilateral 2012   cataract extraction with iol   GALLBLADDER SURGERY  1968   hemi pelvectomy     left side at age 61   HEMIPELVIC Left [place] Left    AMPUTATION d/t cancer at 88 years old   MASTECTOMY, PARTIAL Left 07/27/2017   Procedure: MASTECTOMY PARTIAL;  Surgeon: Robert Bellow, MD;  Location: [facility] ORS;  Service: General;  Laterality: Left;   MOUTH SURGERY  2019   teeth pulled with a bridge insertion.   fell out 12/16/18   OVARY SURGERY Right    cyst removed   SAVORY DILATION  11/22/2014   Procedure: SAVORY DILATION;  Surgeon: Manya Silvas, MD;  Location: [facility] ENDOSCOPY;  Service: Endoscopy;;   SENTINEL NODE BIOPSY Left 07/27/2017   Procedure: SENTINEL NODE BIOPSY;  Surgeon: Robert Bellow, MD;  Location: [facility] ORS;   Service: General;  Laterality: Left;   SKIN CANCER EXCISION     nose and arm and face   TEE WITHOUT CARDIOVERSION N/A 07/25/2019   Procedure: TRANSESOPHAGEAL ECHOCARDIOGRAM (TEE);  Surgeon: Corey Skains, MD;  Location: [facility] ORS;  Service: Cardiovascular;  Laterality: N/A;   TONSILLECTOMY      Medications Prior to Admission  Medication Sig Dispense Refill Last Dose   acetaminophen (TYLENOL) 650 MG CR tablet Take 650 mg by mouth every 8 (eight) hours as needed for pain.   12/15/2021 at 0800   Biotin 10 MG TABS Take 10 mg by mouth at bedtime.   12/14/2021 at 2000   Cholecalciferol 25 MCG (1000 UT) tablet Take 1,000 Units by mouth daily.   12/15/2021 at 0800   diclofenac Sodium (VOLTAREN) 1 % GEL Apply 4 g topically 4 (four) times daily.   12/15/2021 at 0800   gabapentin (NEURONTIN) 100 MG capsule Take 100 mg by mouth  3 (three) times daily.   12/15/2021 at 0800   GLIPIZIDE XL 2.5 MG 24 hr tablet Take 2.5 mg by mouth daily.    12/15/2021 at 0800   letrozole (FEMARA) 2.5 MG tablet TAKE ONE TABLET BY MOUTH EVERY DAY 90 tablet 3 12/15/2021 at 0800   losartan (COZAAR) 50 MG tablet Take 50 mg by mouth daily.   12/15/2021 at 0800   metFORMIN (GLUCOPHAGE) 500 MG tablet Take 500 mg by mouth daily with breakfast.   12/15/2021 at 0800   metoprolol succinate (TOPROL-XL) 25 MG 24 hr tablet Take 12.5 mg by mouth at bedtime.    12/14/2021 at 2000   Multiple Vitamin (MULTIVITAMIN WITH MINERALS) TABS tablet Take 1 tablet by mouth daily.   12/15/2021 at 0800   omeprazole (PRILOSEC) 40 MG capsule Take 40 mg by mouth daily.    12/15/2021 at 0800   PARoxetine (PAXIL) 30 MG tablet Take 30 mg by mouth daily.   12/15/2021 at 0800   Polyethyl Glycol-Propyl Glycol 0.4-0.3 % SOLN Apply to eye 2 (two) times daily as needed.   12/15/2021 at 0800   PRADAXA 150 MG CAPS capsule TAKE 1 CAPSULE BY MOUTH TWICE DAILY START TREATMENT 10 HRS LAST DOSE OF LOVENOX (Patient taking differently: Take 150 mg by mouth 2 (two) times daily.) 60 capsule  2 12/15/2021 at 0800   acetaminophen (TYLENOL) 500 MG tablet Take 500 mg by mouth every 6 (six) hours as needed.   prn at prn   Blood Glucose Monitoring Suppl (FIFTY50 GLUCOSE METER 2.0) w/Device KIT See admin instructions. (Patient not taking: Reported on 01/22/2021)      Dentifrices (BIOTENE DRY MOUTH DT) by Transmucosal route.   prn at prn   loratadine (CLARITIN) 10 MG tablet Take 10 mg by mouth daily.  (Patient not taking: Reported on 01/22/2021)      nitrofurantoin, macrocrystal-monohydrate, (MACROBID) 100 MG capsule Take 100 mg by mouth 2 (two) times daily. (Patient not taking: Reported on 12/15/2021)   Not Taking   nystatin (MYCOSTATIN/NYSTOP) powder Apply 1 application  topically. Apply to right groin daily   prn at prn   tiZANidine (ZANAFLEX) 2 MG tablet Take 2 mg by mouth every 12 (twelve) hours as needed for muscle spasms.   prn at prn   Social History   Socioeconomic History   Marital status: Divorced    Spouse name: Not on file   Number of children: Not on file   Years of education: Not on file   Highest education level: Not on file  Occupational History   Occupation: Pharmacist, hospital    Comment: retired  Tobacco Use   Smoking status: Never   Smokeless tobacco: Never  Vaping Use   Vaping Use: Never used  Substance and Sexual Activity   Alcohol use: No   Drug use: No   Sexual activity: Not Currently  Other Topics Concern   Not on file  Social History Narrative   Patient lives at [facility]   Social Determinants of Health   Financial Resource Strain: Not on file  Food Insecurity: Not on file  Transportation Needs: Not on file  Physical Activity: Not on file  Stress: Not on file  Social Connections: Not on file  Intimate Partner Violence: Not on file    Family History  Problem Relation Age of Onset   Breast cancer Paternal Aunt    Ovarian cancer Sister    Prostate cancer Father    Stroke Mother    Kidney  cancer Neg Hx    Bladder Cancer Neg Hx        PHYSICAL EXAM General: Elderly and ill-appearing Caucasian female, well nourished, in no acute distress.  Sitting upright in hospital bed HEENT:  Normocephalic and atraumatic. Neck:  No JVD.  Lungs: Normal respiratory effort on 4 L by [place].  Bibasilar crackles, slightly improved from yesterday  heart: Tachycardic irregularly irregular rhythm.  Normal S1 and S2 without appreciable murmurs Abdomen: Obese appearing.  Msk: Normal strength and tone for age. Extremities: S/p left AKA, right lower extremity with trace edema neuro: Alert and oriented X 3. Psych:  Answers questions appropriately but is a poor historian..   Labs:   Lab Results  Component Value Date   WBC 8.6 12/17/2021   HGB 10.9 (L) 12/17/2021   HCT 36.4 12/17/2021   MCV 81.3 12/17/2021   PLT 125 (L) 12/17/2021    Recent Labs  Lab 12/17/21 0533  NA 136  K 3.5  CL 109  CO2 20*  BUN 28*  CREATININE 0.61  CALCIUM 9.5  PROT 6.0*  BILITOT 0.5  ALKPHOS 45  ALT 26  AST 25  GLUCOSE 199*    No results found for: "CKTOTAL", "CKMB", "CKMBINDEX", "TROPONINI"  Lab Results  Component Value Date   CHOL 110 12/15/2021   Lab Results  Component Value Date   HDL 19 (L) 12/15/2021   Lab Results  Component Value Date   LDLCALC 46 12/15/2021   Lab Results  Component Value Date   TRIG 223 (H) 12/15/2021   Lab Results  Component Value Date   CHOLHDL 5.8 12/15/2021   No results found for: "LDLDIRECT"    Radiology: ECHOCARDIOGRAM COMPLETE  Result Date: 12/17/2021    ECHOCARDIOGRAM REPORT   Patient Name:   Julie Acosta Date of Exam: 12/16/2021 Medical Rec #:  [id number]       Height:       60.0 in Accession #:    [id number]      Weight:       177.0 lb Date of Birth:  1933-12-21      BSA:          1.772 m Patient Age:    89 years        BP:           110/47 mmHg Patient Gender: F               HR:           95 bpm. Exam Location:  [facility] Procedure: 2D Echo, Cardiac Doppler and Color Doppler Indications:     Elevated  Troponin  History:         Patient has prior history of Echocardiogram examinations, most                  recent 07/25/2019. Previous Myocardial Infarction, PAD and                  History of breast cancer; Risk Factors:Diabetes, Dyslipidemia                  and Hypertension.  Sonographer:     Rosalia Hammers Referring Phys:  [id number] AGBATA Diagnosing Phys: Isaias Cowman MD  Sonographer Comments: Suboptimal apical window and suboptimal subcostal window. Image acquisition challenging due to patient body habitus. IMPRESSIONS  1. Left ventricular ejection fraction, by estimation, is 60 to 65%. The left ventricle has normal function. The left ventricle has no regional wall motion  abnormalities. Left ventricular diastolic parameters were normal.  2. Right ventricular systolic function is normal. The right ventricular size is normal.  3. The mitral valve is normal in structure. Mild mitral valve regurgitation. No evidence of mitral stenosis.  4. The aortic valve is normal in structure. Aortic valve regurgitation is mild. Aortic valve sclerosis is present, with no evidence of aortic valve stenosis.  5. The inferior vena cava is normal in size with greater than 50% respiratory variability, suggesting right atrial pressure of 3 mmHg. FINDINGS  Left Ventricle: Left ventricular ejection fraction, by estimation, is 60 to 65%. The left ventricle has normal function. The left ventricle has no regional wall motion abnormalities. The left ventricular internal cavity size was normal in size. There is  no left ventricular hypertrophy. Left ventricular diastolic parameters were normal. Right Ventricle: The right ventricular size is normal. No increase in right ventricular wall thickness. Right ventricular systolic function is normal. Left Atrium: Left atrial size was normal in size. Right Atrium: Right atrial size was normal in size. Pericardium: There is no evidence of pericardial effusion. Mitral Valve: The mitral  valve is normal in structure. Mild mitral valve regurgitation. No evidence of mitral valve stenosis. Tricuspid Valve: The tricuspid valve is normal in structure. Tricuspid valve regurgitation is mild . No evidence of tricuspid stenosis. Aortic Valve: The aortic valve is normal in structure. Aortic valve regurgitation is mild. Aortic valve sclerosis is present, with no evidence of aortic valve stenosis. Aortic valve mean gradient measures 2.0 mmHg. Aortic valve peak gradient measures 4.2  mmHg. Aortic valve area, by VTI measures 2.65 cm. Pulmonic Valve: The pulmonic valve was normal in structure. Pulmonic valve regurgitation is not visualized. No evidence of pulmonic stenosis. Aorta: The aortic root is normal in size and structure. Venous: The inferior vena cava is normal in size with greater than 50% respiratory variability, suggesting right atrial pressure of 3 mmHg. IAS/Shunts: No atrial level shunt detected by color flow Doppler.  LEFT VENTRICLE PLAX 2D LVIDd:         3.73 cm   Diastology LVIDs:         2.53 cm   LV e' medial:    5.55 cm/s LV PW:         1.16 cm   LV E/e' medial:  15.2 LV IVS:        1.21 cm   LV e' lateral:   4.68 cm/s LVOT diam:     1.80 cm   LV E/e' lateral: 18.0 LV SV:         46 LV SV Index:   26 LVOT Area:     2.54 cm  RIGHT VENTRICLE RV Basal diam:  3.22 cm RV S prime:     13.30 cm/s TAPSE (M-mode): 1.9 cm LEFT ATRIUM           Index        RIGHT ATRIUM           Index LA diam:      3.80 cm 2.14 cm/m   RA Area:     14.50 cm LA Vol (A4C): 43.2 ml 24.38 ml/m  RA Volume:   35.30 ml  19.92 ml/m  AORTIC VALVE AV Area (Vmax):    1.99 cm AV Area (Vmean):   2.09 cm AV Area (VTI):     2.65 cm AV Vmax:           102.00 cm/s AV Vmean:  68.200 cm/s AV VTI:            0.172 m AV Peak Grad:      4.2 mmHg AV Mean Grad:      2.0 mmHg LVOT Vmax:         79.60 cm/s LVOT Vmean:        56.000 cm/s LVOT VTI:          0.179 m LVOT/AV VTI ratio: 1.04  AORTA Ao Root diam: 2.70 cm MITRAL VALVE MV  Area (PHT): 4.96 cm     SHUNTS MV Decel Time: 153 msec     Systemic VTI:  0.18 m MV E velocity: 84.40 cm/s   Systemic Diam: 1.80 cm MV A velocity: 108.00 cm/s MV E/A ratio:  0.78 Isaias Cowman MD Electronically signed by Isaias Cowman MD Signature Date/Time: 12/17/2021/9:17:52 AM    Final    CT Angio Chest PE W and/or Wo Contrast  Result Date: 12/15/2021 CLINICAL DATA:  Pulmonary embolism suspected, high probability. Abdominal pain, acute, nonlocalized. Hypoxia EXAM: CT ANGIOGRAPHY CHEST CT ABDOMEN AND PELVIS WITH CONTRAST TECHNIQUE: Multidetector CT imaging of the chest was performed using the standard protocol during bolus administration of intravenous contrast. Multiplanar CT image reconstructions and MIPs were obtained to evaluate the vascular anatomy. Multidetector CT imaging of the abdomen and pelvis was performed using the standard protocol during bolus administration of intravenous contrast. RADIATION DOSE REDUCTION: This exam was performed according to the departmental dose-optimization program which includes automated exposure control, adjustment of the mA and/or kV according to patient size and/or use of iterative reconstruction technique. CONTRAST:  43mL OMNIPAQUE IOHEXOL 350 MG/ML SOLN COMPARISON:  CT chest with contrast 07/23/2019 scratched at CT of the chest, abdomen and pelvis with contrast 07/23/2019 FINDINGS: CTA CHEST FINDINGS Cardiovascular: Heart is enlarged. Atherosclerotic calcifications are present at the aortic arch and great vessel origins. New significant stenosis or aneurysm is present. Pulmonary artery opacification is excellent. No focal filling defects are present to suggest pulmonary emboli. Pulmonary arteries are enlarged. Mediastinum/Nodes: No enlarged mediastinal, hilar, or axillary lymph nodes. Thyroid gland, trachea, and esophagus demonstrate no significant findings. Lungs/Pleura: The right main pulmonary artery measures up to 25 mm. Left main pulmonary artery  measures up to 26 mm. Right middle lobe and right lower lobe pulmonary nodules on images 40 and 41 of series 6 are stable. Mild dependent airspace disease likely reflects atelectasis. Asymmetric dependent airspace disease is present at the left base. Some asymmetric airspace disease is noted posteriorly in the right upper lobe is well. No significant pleural effusion or pneumothorax is present. Musculoskeletal: No chest wall abnormality. No acute or significant osseous findings. Review of the MIP images confirms the above findings. CT ABDOMEN and PELVIS FINDINGS Hepatobiliary: No focal liver abnormality is seen. Status post cholecystectomy. No biliary dilatation. Pancreas: Unremarkable. No pancreatic ductal dilatation or surrounding inflammatory changes. Spleen: Normal in size without focal abnormality. Adrenals/Urinary Tract: 2 simple cysts are present in the left kidney. The larger measures 10 mm. 5 mm nonobstructing stone is present in the interpolar region of the right kidney. A 7 mm nonobstructing stone is present near the upper pole of the left kidney. Two 5 mm stones are present in the upper and lower poles of the left kidney respectively. At least 2 other punctate nonobstructing stones are present near the upper pole of the left kidney. No solid mass lesion is present. Ureters are within normal limits bilaterally. No obstruction is present. The urinary bladder is within normal limits.  Stomach/Bowel: The stomach and duodenum are within normal limits. Small bowel is unremarkable. Terminal ileum is normal. The appendix is visualized and within normal limits. The ascending and transverse colon are within normal limits. Diverticular changes are present the distal descending and sigmoid colon. No inflammatory changes are present to suggest diverticulitis. Vascular/Lymphatic: Atherosclerotic calcifications are present in the aorta and branch vessels. No aneurysm is present. Reproductive: 21 mm left adnexal cyst is  stable. Uterus and adnexa are otherwise unremarkable. Other: Choose 1 Musculoskeletal: Left hemipelvectomy stable. Degenerative changes are noted at the right SI joint. No focal osseous lesions are present. Degenerative changes are present in the lower lumbar spine. Review of the MIP images confirms the above findings. IMPRESSION: 1. No pulmonary embolus. 2. Asymmetric airspace disease at the left base and posteriorly in the right upper lobe concerning for pneumonia. 3. Cardiomegaly with enlargement of the main pulmonary arteries suggesting pulmonary arterial hypertension. 4. Stable right middle lobe and right lower lobe pulmonary nodules. 5. Bilateral nonobstructing renal stones. 6. Descending and sigmoid diverticulosis without evidence for diverticulitis. 7. Stable 21 mm left adnexal cyst. No follow-up imaging recommended. Note: This recommendation does not apply to premenarchal patients and to those with increased risk (genetic, family history, elevated tumor markers or other high-risk factors) of ovarian cancer. Reference: JACR 2020 Feb; 17(2):248-254 8. Degenerative changes in the lower lumbar spine. 9. Aortic Atherosclerosis (ICD10-I70.0). Electronically Signed   By: San Morelle M.D.   On: 12/15/2021 12:17   CT ABDOMEN PELVIS W CONTRAST  Result Date: 12/15/2021 CLINICAL DATA:  Pulmonary embolism suspected, high probability. Abdominal pain, acute, nonlocalized. Hypoxia EXAM: CT ANGIOGRAPHY CHEST CT ABDOMEN AND PELVIS WITH CONTRAST TECHNIQUE: Multidetector CT imaging of the chest was performed using the standard protocol during bolus administration of intravenous contrast. Multiplanar CT image reconstructions and MIPs were obtained to evaluate the vascular anatomy. Multidetector CT imaging of the abdomen and pelvis was performed using the standard protocol during bolus administration of intravenous contrast. RADIATION DOSE REDUCTION: This exam was performed according to the departmental  dose-optimization program which includes automated exposure control, adjustment of the mA and/or kV according to patient size and/or use of iterative reconstruction technique. CONTRAST:  56mL OMNIPAQUE IOHEXOL 350 MG/ML SOLN COMPARISON:  CT chest with contrast 07/23/2019 scratched at CT of the chest, abdomen and pelvis with contrast 07/23/2019 FINDINGS: CTA CHEST FINDINGS Cardiovascular: Heart is enlarged. Atherosclerotic calcifications are present at the aortic arch and great vessel origins. New significant stenosis or aneurysm is present. Pulmonary artery opacification is excellent. No focal filling defects are present to suggest pulmonary emboli. Pulmonary arteries are enlarged. Mediastinum/Nodes: No enlarged mediastinal, hilar, or axillary lymph nodes. Thyroid gland, trachea, and esophagus demonstrate no significant findings. Lungs/Pleura: The right main pulmonary artery measures up to 25 mm. Left main pulmonary artery measures up to 26 mm. Right middle lobe and right lower lobe pulmonary nodules on images 40 and 41 of series 6 are stable. Mild dependent airspace disease likely reflects atelectasis. Asymmetric dependent airspace disease is present at the left base. Some asymmetric airspace disease is noted posteriorly in the right upper lobe is well. No significant pleural effusion or pneumothorax is present. Musculoskeletal: No chest wall abnormality. No acute or significant osseous findings. Review of the MIP images confirms the above findings. CT ABDOMEN and PELVIS FINDINGS Hepatobiliary: No focal liver abnormality is seen. Status post cholecystectomy. No biliary dilatation. Pancreas: Unremarkable. No pancreatic ductal dilatation or surrounding inflammatory changes. Spleen: Normal in size without focal abnormality. Adrenals/Urinary Tract:  2 simple cysts are present in the left kidney. The larger measures 10 mm. 5 mm nonobstructing stone is present in the interpolar region of the right kidney. A 7 mm  nonobstructing stone is present near the upper pole of the left kidney. Two 5 mm stones are present in the upper and lower poles of the left kidney respectively. At least 2 other punctate nonobstructing stones are present near the upper pole of the left kidney. No solid mass lesion is present. Ureters are within normal limits bilaterally. No obstruction is present. The urinary bladder is within normal limits. Stomach/Bowel: The stomach and duodenum are within normal limits. Small bowel is unremarkable. Terminal ileum is normal. The appendix is visualized and within normal limits. The ascending and transverse colon are within normal limits. Diverticular changes are present the distal descending and sigmoid colon. No inflammatory changes are present to suggest diverticulitis. Vascular/Lymphatic: Atherosclerotic calcifications are present in the aorta and branch vessels. No aneurysm is present. Reproductive: 21 mm left adnexal cyst is stable. Uterus and adnexa are otherwise unremarkable. Other: Choose 1 Musculoskeletal: Left hemipelvectomy stable. Degenerative changes are noted at the right SI joint. No focal osseous lesions are present. Degenerative changes are present in the lower lumbar spine. Review of the MIP images confirms the above findings. IMPRESSION: 1. No pulmonary embolus. 2. Asymmetric airspace disease at the left base and posteriorly in the right upper lobe concerning for pneumonia. 3. Cardiomegaly with enlargement of the main pulmonary arteries suggesting pulmonary arterial hypertension. 4. Stable right middle lobe and right lower lobe pulmonary nodules. 5. Bilateral nonobstructing renal stones. 6. Descending and sigmoid diverticulosis without evidence for diverticulitis. 7. Stable 21 mm left adnexal cyst. No follow-up imaging recommended. Note: This recommendation does not apply to premenarchal patients and to those with increased risk (genetic, family history, elevated tumor markers or other high-risk  factors) of ovarian cancer. Reference: JACR 2020 Feb; 17(2):248-254 8. Degenerative changes in the lower lumbar spine. 9. Aortic Atherosclerosis (ICD10-I70.0). Electronically Signed   By: San Morelle M.D.   On: 12/15/2021 12:17   DG Chest Port 1 View  Result Date: 12/15/2021 CLINICAL DATA:  88 year old female with possible sepsis. EXAM: PORTABLE CHEST 1 VIEW COMPARISON:  Chest x-ray 10/02/2017. FINDINGS: Lung volumes are low. Blunting of the left costophrenic sulcus. Opacity at the left base which may reflect atelectasis and/or consolidation. Diffuse interstitial prominence and peribronchial cuffing, concerning for an acute bronchitis. No pneumothorax. Heart size is upper limits of normal. The patient is rotated to the left on today's exam, resulting in distortion of the mediastinal contours and reduced diagnostic sensitivity and specificity for mediastinal pathology. IMPRESSION: 1. Findings are concerning for severe acute bronchitis, likely with developing left lower lobe bronchopneumonia and small parapneumonic pleural effusion. Followup PA and lateral chest X-ray is recommended in 3-4 weeks following trial of antibiotic therapy to ensure resolution and exclude underlying malignancy. Electronically Signed   By: Vinnie Langton M.D.   On: 12/15/2021 10:29    ECHO 07/24/2019  1. Left ventricular ejection fraction, by estimation, is 55 to 60%. The  left ventricle has normal function. The left ventricle has no regional  wall motion abnormalities.   2. Right ventricular systolic function is normal. The right ventricular  size is normal.   3. Left atrial size was mild to moderately dilated. No left atrial/left  atrial appendage thrombus was detected.   4. The mitral valve is normal in structure. Mild to moderate mitral valve  regurgitation.   5. The  aortic valve is normal in structure. Aortic valve regurgitation is  trivial.   6. There is mild (Grade II) plaque.   FINDINGS   Left  Ventricle: Left ventricular ejection fraction, by estimation, is 55  to 60%. The left ventricle has normal function. The left ventricle has no  regional wall motion abnormalities. The left ventricular internal cavity  size was normal in size. There is   no left ventricular hypertrophy.   Right Ventricle: The right ventricular size is normal. No increase in  right ventricular wall thickness. Right ventricular systolic function is  normal.   Left Atrium: Left atrial size was mild to moderately dilated. No left  atrial/left atrial appendage thrombus was detected.   Right Atrium: Right atrial size was normal in size.   Pericardium: There is no evidence of pericardial effusion.   Mitral Valve: The mitral valve is normal in structure. Mild to moderate  mitral valve regurgitation. There is no evidence of mitral valve  vegetation.   Tricuspid Valve: The tricuspid valve is normal in structure. Tricuspid  valve regurgitation is mild. There is no evidence of tricuspid valve  vegetation.   Aortic Valve: The aortic valve is normal in structure. Aortic valve  regurgitation is trivial. There is no evidence of aortic valve vegetation.   Pulmonic Valve: The pulmonic valve was grossly normal. Pulmonic valve  regurgitation is not visualized.   Aorta: The aortic root and ascending aorta are structurally normal, with  no evidence of dilitation. There is mild (Grade II) plaque.   IAS/Shunts: There is right bowing of the interatrial septum, suggestive of  elevated left atrial pressure. No atrial level shunt detected by color  flow Doppler.   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  She was hypoxic to 78%, on room air, requiring 6 L of supplemental oxygen.  CTA chest negative for PE but concerning for pneumonia.  She met sepsis criteria with leukocytosis to 16, tachycardia to the 110s, and elevated lactate.  Cardiology is consulted due to her elevated troponin. She converted to AF with RVR the early morning of 8/15 in the setting of hypomagnesemia, abnormally low TSH, and ongoing treatment for pneumonia.  #New onset paroxysmal atrial fibrillation with RVR #Low TSH #Hypomagnesemia Noted on telemetry early morning hours of 8/15, heart rate in the 120s to 130s patient asymptomatic.  Notably, her TSH is extremely low at <0.010 with T3 and T4 pending.  Magnesium also low at 1.5, now being repleted.  She denies a history of a thyroid disorder. -Monitor and replete electrolytes -Continuous monitoring on telemetry -Agree with further thyroid workup  -Increase metoprolol to tartrate from 12.5 mg to 25 mg 3 times daily, goal heart rate high 90s to low 100s. -Continue Pradaxa 150 mg twice daily for stroke risk reduction, and with her history of factor V.  CHA2DS2-VASc 4 (age, sex, DM)   #Sepsis 2/2 HCAP #E coli bacteremia  #Acute hypoxic respiratory failure #Elevated troponin  #  factor V leiden  Patient presents with a several day history of poor appetite and "just not feeling well" and was hypoxic on room air to 78% initially requiring 6L of HFNC. She is being treated empirically for pneumonia with azithromycin and blood cultures are positive for ecoli, currently on ceftriaxone.  A troponin was checked on admission which was initially elevated at 475 and downtrending thereafter to 342 most recently.  In the absence of chest pain or  significant EKG changes, this likely represents demand ischemia and not ACS. -agree with current therapy per primary team  -s/p 325 mg aspirin, continue 81 mg aspirin daily in addition to Pradaxa 150 twice daily -Defer heparin drip -increase metoprolol tartrate from 12.5 mg 3 times daily to 25 TID  for new onset atrial fibrillation for rate control as above -S/p IV Lasix 40 mg x1, agree with stopping IV fluids, hold dose another 40 mg of IV Lasix x1 for hopeful improvement in aeration and weaning of oxygen.   -Continue to use incentive spirometer -Echocardiogram complete resulted with preserved LVEF as above. -Recommend conservative input from a cardiac standpoint.  No further cardiac diagnostics necessary.  She will need follow-up with Dr. Saralyn Pilar in 2 weeks.  This patient's plan of care was discussed and created with Dr. Saralyn Pilar and he is in agreement.  Signed: Tristan Schroeder , PA-C 12/18/2021, 10:59 AM [facility] Card[facility]

## 2021-12-18 NOTE — Evaluation (Addendum)
Occupational Therapy Evaluation Patient Details Name: Julie Acosta MRN: [id number] DOB: 01/08/1934 Today's Date: 12/18/2021   History of Present Illness Pt is an 88 year old female presenting to the ED with generalized weakness; admitted with Acute respiratory failure with hypoxia and severe sepsis due to HCAP; PMH significant for  breast cancer, anxiety, left leg sarcoma status post left hemipelvectomy and amputation, DVT on Pradaxa, hypertension, hyperlipidemia, diabetes mellitus, depression, anxiety, panic attack, diverticulitis, obesity obesity BMI 34.57, ureteral stone with hydronephrosis, PVD   Clinical Impression   Chart reviewed, RN cleared pt for participation in OT evaluation. Pt is alert, oriented to self, not oriented to place, date, grossly oriented to situation. Pt requires increased time and verbal/tactile cues for processing. MAX A +2 required for supine<>sit. Fair-good sitting balance seated on edge of bed. Pt describes squat pivot transfer as a transfer from bed> power wheelchair with heavy assist at baseline. Pt presents with deficits in strength, endurance, activity tolerance and is performing below PLOF. Recommend STR following discharge.  Pt is left as received, NAD, all needs met. OT will follow acutely.     Recommendations for follow up therapy are one component of a multi-disciplinary discharge planning process, led by the attending physician.  Recommendations may be updated based on patient status, additional functional criteria and insurance authorization.   Follow Up Recommendations  Skilled nursing-short term rehab (<3 hours/day)    Assistance Recommended at Discharge Frequent or constant Supervision/Assistance  Patient can return home with the following A lot of help with walking and/or transfers;Direct supervision/assist for medications management;Assist for transportation;Assistance with cooking/housework;A lot of help with bathing/dressing/bathroom     Functional Status Assessment  Patient has had a recent decline in their functional status and demonstrates the ability to make significant improvements in function in a reasonable and predictable amount of time.  Equipment Recommendations  Other (comment) (defer)    Recommendations for Other Services       Precautions / Restrictions Precautions Precautions: Fall Restrictions Weight Bearing Restrictions: No      Mobility Bed Mobility Overal bed mobility: Needs Assistance Bed Mobility: Supine to Sit, Sit to Supine     Supine to sit: Max assist, HOB elevated, +2 for physical assistance Sit to supine: Max assist, +2 for physical assistance, HOB elevated   General bed mobility comments: step by step vcs for technique    Transfers                   General transfer comment: not tested on this date, pt squat pivots to pwc at baseline, HR up to 130 seated at edge of bed;      Balance Overall balance assessment: Needs assistance Sitting-balance support: Single extremity supported Sitting balance-Leahy Scale: Fair                                     ADL either performed or assessed with clinical judgement   ADL Overall ADL's : Needs assistance/impaired     Grooming: Wash/dry hands;Wash/dry face;Sitting;Set up               Lower Body Dressing: Maximal assistance Lower Body Dressing Details (indicate cue type and reason): anticipated     Toileting- Clothing Manipulation and Hygiene: Maximal assistance               Vision Patient Visual Report: No change from baseline  Perception     Praxis      Pertinent Vitals/Pain Pain Assessment Pain Assessment: Faces Faces Pain Scale: Hurts little more Pain Location: L hip area with mobility Pain Descriptors / Indicators: Grimacing Pain Intervention(s): Limited activity within patient's tolerance, Monitored during session, Repositioned     Hand Dominance     Extremity/Trunk  Assessment Upper Extremity Assessment Upper Extremity Assessment: Generalized weakness (pt unable to perform bed mobility supine>sit without assist)   Lower Extremity Assessment Lower Extremity Assessment: LLE deficits/detail;Defer to PT evaluation LLE Deficits / Details: s/p old hemipelvectomy   Cervical / Trunk Assessment Cervical / Trunk Assessment:  (L hip obliquity with scoli noted in the lumbar spine to the L)   Communication Communication Communication: No difficulties   Cognition Arousal/Alertness: Awake/alert Behavior During Therapy: WFL for tasks assessed/performed Overall Cognitive Status: Impaired/Different from baseline Area of Impairment: Attention, Memory, Following commands, Safety/judgement, Awareness, Problem solving, Orientation                 Orientation Level: Disoriented to, Time, Situation, Place Current Attention Level: Focused Memory: Decreased short-term memory Following Commands: Follows one step commands with increased time Safety/Judgement: Decreased awareness of safety Awareness: Emergent Problem Solving: Slow processing, Requires verbal cues, Requires tactile cues       General Comments  HR up to 131 seated at edge of bed; spo2 >90% on 3 L via [place] throughout    Exercises     Shoulder Instructions      Home Living Family/patient expects to be discharged to::  [facility]) SNF                                        Prior Functioning/Environment Prior Level of Function : Needs assist       Physical Assist : Mobility (physical);ADLs (physical) Mobility (physical): Bed mobility;Transfers ADLs (physical): Bathing;Dressing;Toileting;IADLs Mobility Comments: pt describes squat pivot transferring from bed to power wheelchair with assist of staff at [facility] ADLs Comments: Assist for dressing, bathing, toileting; IADLs        OT Problem List: Decreased strength;Decreased activity tolerance      OT  Treatment/Interventions: Self-care/ADL training;Patient/family education;Therapeutic exercise;Balance training;Therapeutic activities;DME and/or AE instruction;Cognitive remediation/compensation    OT Goals(Current goals can be found in the care plan section) Acute Rehab OT Goals Patient Stated Goal: go back to [facility] OT Goal Formulation: With patient Time For Goal Achievement: 01/01/22 Potential to Achieve Goals: Good ADL Goals Pt Will Perform Grooming: with set-up;sitting Pt Will Transfer to Toilet: with mod assist;squat pivot transfer  OT Frequency: Min 2X/week    Co-evaluation PT/OT/SLP Co-Evaluation/Treatment: Yes Reason for Co-Treatment: To address functional/ADL transfers;Necessary to address cognition/behavior during functional activity;For patient/therapist safety   OT goals addressed during session: ADL's and self-care      AM-PAC OT "6 Clicks" Daily Activity     Outcome Measure Help from another person eating meals?: A Little Help from another person taking care of personal grooming?: A Little Help from another person toileting, which includes using toliet, bedpan, or urinal?: A Lot Help from another person bathing (including washing, rinsing, drying)?: A Lot Help from another person to put on and taking off regular upper body clothing?: A Lot Help from another person to put on and taking off regular lower body clothing?: A Lot 6 Click Score: 14   End of Session Equipment Utilized During Treatment: Oxygen Nurse Communication: Mobility  status  Activity Tolerance: Patient tolerated treatment well Patient left: in bed;Other (comment);with call bell/phone within reach;with bed alarm set;with nursing/sitter in room (in chair mode)  OT Visit Diagnosis: Unsteadiness on feet (R26.81);Muscle weakness (generalized) (M62.81)                Time: 2585-2778 OT Time Calculation (min): 22 min Charges:  OT General Charges $OT Visit: 1 Visit OT Evaluation $OT Eval Moderate  Complexity: 1 Mod  Shanon Payor, OTD OTR/L  12/18/21, 12:39 PM

## 2021-12-18 NOTE — TOC Progression Note (Addendum)
Transition of Care [facility]) - Progression Note    Patient Details  Name: Julie Acosta MRN: [id number] Date of Birth: [birth year]  [facility] CM/SW [place], LCSW Phone Number: 12/18/2021, 10:51 AM  Clinical Narrative:  [facility] unable to accept patient back until without restraints for 24 hours. MD is aware. Sent secure chat to RN asking him to remove in preparation for discharge tomorrow. Per MD, will not need IV abx at discharge.   2:18 pm: Left voicemail for daughter to see if she wants therapy when patient returns to [facility].  Expected Discharge Plan: [place] Barriers to Discharge: Continued Medical Work up  Expected Discharge Plan and Services Expected Discharge Plan: [place] Acute Care Choice: Resumption of Svcs/PTA Provider Living arrangements for the past 2 months: Skilled Nursing Facility                                       Social Determinants of Health (SDOH) Interventions    Readmission Risk Interventions     No data to display

## 2021-12-18 NOTE — Progress Notes (Signed)
Intermittent confusion. Pt removed tele monitor, and IVs multiple times. Mittens applied. No signs or symptoms of distress. VSS. Will continue to monitor pt.

## 2021-12-18 NOTE — Care Management Important Message (Signed)
Important Message  Patient Details  Name: Julie Acosta MRN: [id number] Date of Birth: 11-28-1933   Medicare Important Message Given:  Yes     [name] 12/18/2021, 10:32 AM

## 2021-12-18 NOTE — Progress Notes (Signed)
Progress Note   Patient: Julie Acosta WVP:[id number] DOB: 18-Nov-1933 DOA: 12/15/2021     3 DOS: the patient was seen and examined on 12/18/2021   Brief hospital course: KIYANNA BIEGLER is a 88 y.o. 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   Patient stated she has generalized weakness in the past several days, which has been progressively worsening.  He has nausea, denies vomiting, diarrhea or abdominal pain. Patient is normally not using oxygen, but she was found to have oxygen desaturation to 78% on room air in ED, which improved to 92-93% on 6 L oxygen however, patient denies cough, shortness breath and chest pain.  No fever or chills.  Denies symptoms of UTI.  No unilateral numbness or tinglings in extremities.  No facial droop or slurred speech.  She has poor appetite and decreased oral intake.  She reports sore throat.   Data reviewed independently and ED Course: pt was found to have WBC 16.7, negative COVID PCR, lactic acid 2.4, INR 1.3, PTT 31, troponin level 475, mild AKI with creatinine 1.04, BUN 25, GFR 53.  Liver function normal, lipase 22.  Temperature normal, blood pressure 115/60, heart rate 111, RR 25.  Chest x-ray showed possible left lower lobe infiltration.  CTA negative for PE, showed infiltration in left base and the right upper lobe.  Patient is admitted to PCU as inpatient  Assessment and Plan: * HCAP (healthcare-associated pneumonia) Acute respiratory failure with hypoxia and severe sepsis due to HCAP:  Patient had a new oxygen requirement on admission and is currently on 4 L from 6 L to maintain pulse oximetry greater than 92%. She is less tachypneic and has reduced work of  breathing compared to admission Continue to wean off oxygen as tolerated Continue IS  Acute respiratory failure with hypoxia ([place]) Secondary to healthcare associated pneumonia. Patient is currently down to 4 L of oxygen from 6 L of oxygen We will attempt to wean off oxygen as tolerated Continue IS  Severe sepsis (HCC) Appears to be multifactorial and secondary to healthcare associated pneumonia as well as a UTI CT angio shows asymmetric airspace disease at the left base and posteriorly in the right upper lobe concerning for pneumonia Patient also has pyuria Patient also noted to have E. coli bacteremia, blood cultures yielded greater than 100,000 CFU of E. coli Continue empiric antibiotic therapy with Rocephin and azithromycin Patient will require IV antibiotic therapy for at least 7 days, will discuss transition to oral with ID pharm  Type 2 MI (myocardial infarction) ([place]) Type 2 MI, Most likely secondary to demand ischemia from severe sepsis and hypoxia Troponin 475, no chest pain. Continue aspirin and metoprolol 2D echocardiogram shows an LVEF of 60 to 65% with no evidence of regional wall motion abnormality.  Appreciate cardiology consult  DVT (deep venous thrombosis) (HCC) Stable Continue Pradaxa  Controlled type 2 diabetes mellitus without complication, without long-term current use of insulin (HCC) Recent A1c 6.6, well controlled.  Patient is taking metformin and glipizide. Oral intake has been poor since admission Check blood sugars with meals and start sliding scale if patient has hyperglycemia Start patient on Glucerna  Delirium 2/2 above issues - precautions  Hypertension - IV hydralazine as needed for systolic blood pressure greater than 160 -Hold home Cozaar  since patient has been normotensive -Continue metoprolol  Breast cancer of upper-outer quadrant of left female breast ([place]) S/p of radiation therapy Continue letrozole  Obesity with body mass index of  30.0-39.9 BMI= 34.57  and BW= 83.6 Complicates overall prognosis and care Lifestyle modification and exercise discussed with patient  Depression Stable Continue Paxil  Atrial fibrillation with RVR ([place]) New onset Patient has a CHA2DS2-VASc score of 4 and ideally requires long-term anticoagulation as primary prophylaxis for an acute stroke Continue metoprolol for rate control Continue Pradaxa   Hyperthyroid TSH low, t4 elevated - f/u trab, thyroid u/s   Subjective: Patient is seen and examined at bedside.  No complaints, feeling better  Physical Exam: Vitals:   12/18/21 0007 12/18/21 0144 12/18/21 0415 12/18/21 0802  BP: 106/65  116/61 (!) 123/57  Pulse: 90  97 89  Resp: '20  16 15  '$ Temp: 98.8 F (37.1 C)  98.6 F (37 C) 98.7 F (37.1 C)  TempSrc:   Oral Oral  SpO2: 98% 97% 99% 99%  Weight:       General: Not in acute distress HEENT:       Eyes: PERRL, EOMI, no scleral icterus.       ENT: moist  Cardiac: S1/S2, irregularly irregular, No murmurs, No gallops or rubs. Respiratory: has fine crackles bilaterally in bases GI: Soft, nondistended, nontender, no rebound pain, no organomegaly, BS present. GU: No hematuria Ext: No pitting leg edema bilaterally. 1+DP/PT pulse in right leg. S/p of left leg amputation Musculoskeletal: No joint deformities, No joint redness or warmth, no limitation of ROM in spin. Skin: No rashes.  Neuro: Alert, oriented X3, cranial nerves II-XII grossly intact, moves all extremities. Psych: Patient is not psychotic, no suicidal or hemocidal ideation.  Data Reviewed: Relevant notes from primary care and specialist visits, past discharge summaries as available in EHR, including Care Everywhere. Prior diagnostic testing as pertinent to current admission diagnoses Updated medications and problem lists for reconciliation ED course, including vitals, labs, imaging, treatment and response to treatment Triage notes, nursing and pharmacy notes and ED  provider's notes Notable results as noted in HPI  There are no new results to review at this time.  Family Communication: daughter updated telephonically 8/16  Disposition: Status is: Inpatient Remains inpatient appropriate because: snf won't take her today given need for soft restraints  Planned Discharge Destination: Skilled nursing facility   Author: Desma Maxim, MD 12/18/2021 10:51 AM  For on call review [email or web address].

## 2021-12-19 DIAGNOSIS — E041 Nontoxic single thyroid nodule: Secondary | ICD-10-CM

## 2021-12-19 DIAGNOSIS — E0590 Thyrotoxicosis, unspecified without thyrotoxic crisis or storm: Secondary | ICD-10-CM

## 2021-12-19 DIAGNOSIS — J189 Pneumonia, unspecified organism: Secondary | ICD-10-CM | POA: Diagnosis not present

## 2021-12-19 HISTORY — DX: Thyrotoxicosis, unspecified without thyrotoxic crisis or storm: E05.90

## 2021-12-19 LAB — BASIC METABOLIC PANEL
Anion gap: 7 (ref 5–15)
BUN: 21 mg/dL (ref 8–23)
CO2: 20 mmol/L — ABNORMAL LOW (ref 22–32)
Calcium: 9.4 mg/dL (ref 8.9–10.3)
Chloride: 112 mmol/L — ABNORMAL HIGH (ref 98–111)
Creatinine, Ser: 0.53 mg/dL (ref 0.44–1.00)
GFR, Estimated: >60 mL/min (ref >60)
Glucose, Bld: 243 mg/dL — ABNORMAL HIGH (ref 70–99)
Potassium: 3.6 mmol/L (ref 3.5–5.1)
Sodium: 139 mmol/L (ref 135–145)

## 2021-12-19 LAB — CULTURE, BLOOD (ROUTINE X 2): Special Requests: ADEQUATE

## 2021-12-19 LAB — THYROID ANTIBODIES
Thyroglobulin Antibody: <1 IU/mL (ref 0.0–0.9)
Thyroperoxidase Ab SerPl-aCnc: <9 IU/mL (ref 0–34)

## 2021-12-19 LAB — GLUCOSE, CAPILLARY
Glucose-Capillary: 198 mg/dL — ABNORMAL HIGH (ref 70–99)
Glucose-Capillary: 227 mg/dL — ABNORMAL HIGH (ref 70–99)

## 2021-12-19 MED ORDER — ASPIRIN 81 MG PO TBEC: 81.0000 mg | DELAYED_RELEASE_TABLET | Freq: Every day | ORAL | 12 refills | Status: DC

## 2021-12-19 MED ORDER — METOPROLOL TARTRATE 25 MG PO TABS: 25.0000 mg | ORAL_TABLET | Freq: Three times a day (TID) | ORAL | 1 refills | Status: DC

## 2021-12-19 MED ORDER — CEFADROXIL 500 MG PO CAPS: 1000.0000 mg | ORAL_CAPSULE | Freq: Two times a day (BID) | ORAL | 0 refills | Status: AC

## 2021-12-19 NOTE — Discharge Summary (Addendum)
Julie Acosta DDU:[id number] DOB: 01-Jul-1933 DOA: 12/15/2021  PCP: [facility]  Admit date: 12/15/2021 Discharge date: 12/19/2021  Time spent: 45 minutes  Recommendations for Outpatient Follow-up:  Cardiology f/u 2 weeks Endocrinology follow-up (referral placed) vs repeat TFTs with PCP and endo refer if remains hypothyroid.  ENT referral for thyroid nodule Pcp f/u    Discharge Diagnoses:  Principal Problem:   HCAP (healthcare-associated pneumonia) Active Problems:   Depression   Controlled type 2 diabetes mellitus without complication, without long-term current use of insulin (HCC)   Hypertension   PAD (peripheral artery disease) (HCC)   Type 2 MI (myocardial infarction) ([place])   Severe sepsis (HCC)   Breast cancer of upper-outer quadrant of left female breast ([place])   Obesity with body mass index of 30.0-39.9   DVT (deep venous thrombosis) ([place])   Acute respiratory failure with hypoxia (HCC)   Atrial fibrillation with RVR ([place])   Hyperthyroidism   Thyroid nodule   Discharge Condition: stable  Diet recommendation: heart healthy  Filed Weights   12/15/21 1008 12/17/21 0640  Weight: 80.3 kg 79.6 kg    History of present illness:  From admission h and p Julie Acosta is a 88 y.o. 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 Patient stated she has generalized weakness in the past several days, which has been progressively worsening.  He has nausea, denies vomiting, diarrhea or abdominal pain. Patient is normally not using oxygen, but she was found to have oxygen desaturation to 78% on room air in ED, which improved to 92-93% on 6 L oxygen however, patient denies  cough, shortness breath and chest pain.  No fever or chills.  Denies symptoms of UTI.  No unilateral numbness or tinglings in extremities.  No facial droop or slurred speech.  She has poor appetite and decreased oral intake.  She reports sore throat.  Hospital Course:  Patient presented with CAP sepsis. Sepsis by tachypnea, tachycardia. With hypoxic respiratory failure initially requiring 6 L now weaned to 3 L and stable on that, tachypnea resolved. Urine culture with insignificant growth but blood cultures positive for e coli. Patient was treated with ceftriaxone and azithromycin which was narrowed to duricef which she will need for an additional 3 days to complete a 7 day course. Hospitalization also notable for nstemi thought to be secondary to demand from above processes. Cardiology consulted and declined further invasive evaluation. Her metoprolol dose was increased and her home losartan held. TTE with normal EF. Patient also developed atrial fibrillation, rate controlled with metoprolol. She is on pradaxa for history of DVT and this was continued. She also suffered from intermittent delirium that is improved at time of discharge. Finally patient was diagnosed with hyperthyroidism which likely contributes to her a fib. Tsh low and t4 mildly elevated, t3 actually low end of normal. Thyroid u/s with heterogenous thyroid and tirads4 nodule Thyroid antibodies negative. For these equivocal findings did not start medication, rather advise outpt f/u with endocrinology or pcp for repeat TFTs when acute illness is resolved. ENT referral made for tirads4 nodule.  Procedures: none   Consultations: cardiology  Discharge Exam: Vitals:   12/19/21 0809 12/19/21 1137  BP: (!) 140/62 130/73  Pulse: (!) 117 84  Resp:    Temp: 97.8 F (36.6 C)  100 F (37.8 C)  SpO2: 94% 97%    General: Not in acute distress HEENT:       Eyes: PERRL, EOMI, no scleral icterus.       ENT: moist   Cardiac: S1/S2,  irregularly irregular, No murmurs, No gallops or rubs. Respiratory: has fine crackles bilaterally in bases GI: Soft, nondistended, nontender, no rebound pain, no organomegaly, BS present. GU: No hematuria Ext: No pitting leg edema bilaterally. 1+DP/PT pulse in right leg. S/p of left leg amputation Musculoskeletal: No joint deformities, No joint redness or warmth, no limitation of ROM in spin. Skin: No rashes.  Neuro: Alert, oriented X3, cranial nerves II-XII grossly intact, moves all extremities. Psych: Patient is not psychotic, no suicidal or hemocidal ideation.  Discharge Instructions   Discharge Instructions     Ambulatory referral to ENT   Complete by: As directed    Ambulatory referral to Endocrinology   Complete by: As directed    Diet - low sodium heart healthy   Complete by: As directed    Increase activity slowly   Complete by: As directed       Allergies as of 12/19/2021       Reactions   Ciprofloxacin Hives   Codeine Other (See Comments)   HALLUCINATIONS   Tape    Rash and skin irritation/ paper tape and tegaderm OK   Ciprocinonide [fluocinolone] Other (See Comments)   unknown   Amoxicillin Other (See Comments)   Upset stomach Has patient had a PCN reaction causing immediate rash, facial/tongue/throat swelling, SOB or lightheadedness with hypotension: No Has patient had a PCN reaction causing severe rash involving mucus membranes or skin necrosis: No Has patient had a PCN reaction that required hospitalization: No Has patient had a PCN reaction occurring within the last 10 years: Yes If all of the above answers are "NO", then may proceed with Cephalosporin use.;   Cefuroxime Other (See Comments)   OK INTRACAMERALLY PER DR WLP, upset stomach   Latex Rash   Rast test NEGATIVE   Lipitor [atorvastatin] Other (See Comments)   unknown        Medication List     STOP taking these medications    losartan 50 MG tablet Commonly known as: COZAAR    metoprolol succinate 25 MG 24 hr tablet Commonly known as: TOPROL-XL   nitrofurantoin (macrocrystal-monohydrate) 100 MG capsule Commonly known as: MACROBID       TAKE these medications    acetaminophen 500 MG tablet Commonly known as: TYLENOL Take 500 mg by mouth every 6 (six) hours as needed.   acetaminophen 650 MG CR tablet Commonly known as: TYLENOL Take 650 mg by mouth every 8 (eight) hours as needed for pain.   BIOTENE DRY MOUTH DT by Transmucosal route.   Biotin 10 MG Tabs Take 10 mg by mouth at bedtime.   cefadroxil 500 MG capsule Commonly known as: DURICEF Take 2 capsules (1,000 mg total) by mouth 2 (two) times daily for 3 days.   Cholecalciferol 25 MCG (1000 UT) tablet Take 1,000 Units by mouth daily.   diclofenac Sodium 1 % Gel Commonly known as: VOLTAREN Apply 4 g topically 4 (four) times daily.   Fifty50 Glucose Meter 2.0 w/Device Kit See admin instructions.   gabapentin 100 MG capsule Commonly known as: NEURONTIN Take 100 mg by mouth 3 (three) times daily.   glipiZIDE XL 2.5 MG 24 hr tablet Generic drug: glipiZIDE Take 2.5 mg by mouth daily.   letrozole 2.5 MG tablet  Commonly known as: FEMARA TAKE ONE TABLET BY MOUTH EVERY DAY   loratadine 10 MG tablet Commonly known as: CLARITIN Take 10 mg by mouth daily.   metFORMIN 500 MG tablet Commonly known as: GLUCOPHAGE Take 500 mg by mouth daily with breakfast.   metoprolol tartrate 25 MG tablet Commonly known as: LOPRESSOR Take 1 tablet (25 mg total) by mouth 3 (three) times daily.   multivitamin with minerals Tabs tablet Take 1 tablet by mouth daily.   nystatin powder Commonly known as: MYCOSTATIN/NYSTOP Apply 1 application  topically. Apply to right groin daily   omeprazole 40 MG capsule Commonly known as: PRILOSEC Take 40 mg by mouth daily.   PARoxetine 30 MG tablet Commonly known as: PAXIL Take 30 mg by mouth daily.   Polyethyl Glycol-Propyl Glycol 0.4-0.3 % Soln Apply to eye  2 (two) times daily as needed.   Pradaxa 150 MG Caps capsule Generic drug: dabigatran TAKE 1 CAPSULE BY MOUTH TWICE DAILY START TREATMENT 10 HRS LAST DOSE OF LOVENOX What changed: See the new instructions.   tiZANidine 2 MG tablet Commonly known as: ZANAFLEX Take 2 mg by mouth every 12 (twelve) hours as needed for muscle spasms.       Allergies  Allergen Reactions   Ciprofloxacin Hives   Codeine Other (See Comments)    HALLUCINATIONS   Tape     Rash and skin irritation/ paper tape and tegaderm OK   Ciprocinonide [Fluocinolone] Other (See Comments)    unknown   Amoxicillin Other (See Comments)    Upset stomach Has patient had a PCN reaction causing immediate rash, facial/tongue/throat swelling, SOB or lightheadedness with hypotension: No Has patient had a PCN reaction causing severe rash involving mucus membranes or skin necrosis: No Has patient had a PCN reaction that required hospitalization: No Has patient had a PCN reaction occurring within the last 10 years: Yes If all of the above answers are "NO", then may proceed with Cephalosporin use.;   Cefuroxime Other (See Comments)    OK INTRACAMERALLY PER DR WLP, upset stomach   Latex Rash    Rast test NEGATIVE   Lipitor [Atorvastatin] Other (See Comments)    unknown    Contact information for follow-up providers     Paraschos, Alexander, MD. Go in 2 week(s).   Specialty: Cardiology Contact information: [facility] [place] [address] [phone]         [facility] Follow up.   Specialty: Geriatric Medicine Contact information: [place] [address] [phone]              Contact information for after-discharge care     Destination     [facility] .   Service: Skilled Nursing Contact information: [place] [phone]                       The results of significant diagnostics from this hospitalization (including imaging, microbiology, ancillary and laboratory) are listed below for reference.    Significant Diagnostic Studies: US THYROID  Result Date: 12/18/2021 CLINICAL DATA:  Hyperthyroid. EXAM: THYROID ULTRASOUND TECHNIQUE: Ultrasound examination of the thyroid gland and adjacent soft tissues was performed. COMPARISON:  None Available. FINDINGS: Parenchymal Echotexture: Markedly heterogeneous, no significant glandular hyperemia Isthmus: 0.3 cm Right lobe: 4.3 x 2.7 x 2.3 cm Left lobe: 2.6 x 1 2 x 0.8 cm ________________________________________________________ Estimated total number of nodules >/=  1 cm: 1 Number of spongiform nodules >/=  2 cm not described below (TR1): 0 Number of mixed cystic and solid nodules >/= 1.5 cm not described below ([place]): 0 _________________________________________________________ Nodule # 1: Location: Right; Inferior Maximum size: 3.0 cm; Other 2 dimensions: 2.9 x 2.1 cm Composition: solid/almost completely solid (2) Echogenicity: hypoechoic (2) Shape: not taller-than-wide (0) Margins: ill-defined (0) Echogenic foci: none (0) ACR TI-RADS total points: 4. ACR TI-RADS risk category: TR4 (4-6 points). ACR TI-RADS recommendations: **Given size (>/= 1.5 cm) and appearance, fine needle aspiration of this moderately suspicious nodule should be considered based on TI-RADS criteria. _________________________________________________________ No cervical lymphadenopathy. IMPRESSION: 1. Normal sized, heterogeneous thyroid gland without significant glandular hyperemia. 2. Solid nodule in the right inferior thyroid (labeled 1, 3.0 cm) meets criteria (TI-RADS category 4) for tissue sampling. Recommend consideration of ultrasound-guided fine-needle aspiration. The above is in keeping with the ACR TI-RADS recommendations - J Am Coll Radiol 2017;14:587-595. Ruthann Cancer, MD [facility]  [facility] Radiology Electronically Signed   By: Ruthann Cancer M.D.   On: 12/18/2021 12:52   ECHOCARDIOGRAM COMPLETE  Result Date: 12/17/2021    ECHOCARDIOGRAM REPORT   Patient Name:   Julie Acosta Date of Exam: 12/16/2021 Medical Rec #:  [id number]       Height:       60.0 in Accession #:    [id number]      Weight:       177.0 lb Date of Birth:  Sep 22, 1933      BSA:          1.772 m Patient Age:    88 years        BP:           110/47 mmHg Patient Gender: F               HR:           95 bpm. Exam Location:  [facility] Procedure: 2D Echo, Cardiac Doppler and Color Doppler Indications:     Elevated Troponin  History:         Patient has prior history of Echocardiogram examinations, most                  recent 07/25/2019. Previous Myocardial Infarction, PAD and                  History of breast cancer; Risk Factors:Diabetes, Dyslipidemia                  and Hypertension.  Sonographer:     Rosalia Hammers Referring Phys:  [id number] AGBATA Diagnosing Phys: Isaias Cowman MD  Sonographer Comments: Suboptimal apical window and suboptimal subcostal window. Image acquisition challenging due to patient body habitus. IMPRESSIONS  1. Left ventricular ejection fraction, by estimation, is 60 to 65%. The left ventricle has normal function. The left ventricle has no regional wall motion abnormalities. Left ventricular diastolic parameters were normal.  2. Right ventricular systolic function is normal. The right ventricular size is normal.  3. The mitral valve is normal in structure. Mild mitral valve regurgitation. No evidence of mitral stenosis.  4. The aortic valve is normal in structure. Aortic valve regurgitation is mild. Aortic valve sclerosis is present, with no evidence of aortic valve stenosis.  5. The inferior vena cava is normal in size with greater than 50% respiratory variability, suggesting right atrial pressure of 3 mmHg. FINDINGS  Left Ventricle: Left ventricular ejection fraction, by estimation, is 60  to 65%. The left ventricle has normal function. The left ventricle has no regional wall motion abnormalities. The left ventricular internal cavity size was normal in size. There is  no left ventricular hypertrophy. Left ventricular diastolic parameters were normal. Right Ventricle: The right ventricular size is normal. No increase in right ventricular wall thickness. Right ventricular systolic function is normal. Left Atrium: Left atrial size was normal in size. Right Atrium: Right atrial size was normal in size. Pericardium: There is no evidence of pericardial effusion. Mitral Valve: The mitral valve is normal in structure. Mild mitral valve regurgitation. No evidence of mitral valve stenosis. Tricuspid Valve: The tricuspid valve is normal in structure. Tricuspid valve regurgitation is mild . No evidence of tricuspid stenosis. Aortic Valve: The aortic valve is normal in structure. Aortic valve regurgitation is mild. Aortic valve sclerosis is present, with no evidence of aortic valve stenosis. Aortic valve mean gradient measures 2.0 mmHg. Aortic valve peak gradient measures 4.2  mmHg. Aortic valve area, by VTI measures 2.65 cm. Pulmonic Valve: The pulmonic valve was normal in structure. Pulmonic valve regurgitation is not visualized. No evidence of pulmonic stenosis. Aorta: The aortic root is normal in size and structure. Venous: The inferior vena cava is normal in size with greater than 50% respiratory variability, suggesting right atrial pressure of 3 mmHg. IAS/Shunts: No atrial level shunt detected by color flow Doppler.  LEFT VENTRICLE PLAX 2D LVIDd:         3.73 cm   Diastology LVIDs:         2.53 cm   LV e' medial:    5.55 cm/s LV PW:         1.16 cm   LV E/e' medial:  15.2 LV IVS:        1.21 cm   LV e' lateral:   4.68 cm/s LVOT diam:     1.80 cm   LV E/e' lateral: 18.0 LV SV:         46 LV SV Index:   26 LVOT Area:     2.54 cm  RIGHT VENTRICLE RV Basal diam:  3.22 cm RV S prime:     13.30 cm/s TAPSE  (M-mode): 1.9 cm LEFT ATRIUM           Index        RIGHT ATRIUM           Index LA diam:      3.80 cm 2.14 cm/m   RA Area:     14.50 cm LA Vol (A4C): 43.2 ml 24.38 ml/m  RA Volume:   35.30 ml  19.92 ml/m  AORTIC VALVE AV Area (Vmax):    1.99 cm AV Area (Vmean):   2.09 cm AV Area (VTI):     2.65 cm AV Vmax:           102.00 cm/s AV Vmean:          68.200 cm/s AV VTI:            0.172 m AV Peak Grad:      4.2 mmHg AV Mean Grad:      2.0 mmHg LVOT Vmax:         79.60 cm/s LVOT Vmean:        56.000 cm/s LVOT VTI:          0.179 m LVOT/AV VTI ratio: 1.04  AORTA Ao Root diam: 2.70 cm MITRAL VALVE MV Area (PHT): 4.96 cm     SHUNTS MV Decel Time:  153 msec     Systemic VTI:  0.18 m MV E velocity: 84.40 cm/s   Systemic Diam: 1.80 cm MV A velocity: 108.00 cm/s MV E/A ratio:  0.78 Isaias Cowman MD Electronically signed by Isaias Cowman MD Signature Date/Time: 12/17/2021/9:17:52 AM    Final    CT Angio Chest PE W and/or Wo Contrast  Result Date: 12/15/2021 CLINICAL DATA:  Pulmonary embolism suspected, high probability. Abdominal pain, acute, nonlocalized. Hypoxia EXAM: CT ANGIOGRAPHY CHEST CT ABDOMEN AND PELVIS WITH CONTRAST TECHNIQUE: Multidetector CT imaging of the chest was performed using the standard protocol during bolus administration of intravenous contrast. Multiplanar CT image reconstructions and MIPs were obtained to evaluate the vascular anatomy. Multidetector CT imaging of the abdomen and pelvis was performed using the standard protocol during bolus administration of intravenous contrast. RADIATION DOSE REDUCTION: This exam was performed according to the departmental dose-optimization program which includes automated exposure control, adjustment of the mA and/or kV according to patient size and/or use of iterative reconstruction technique. CONTRAST:  44mL OMNIPAQUE IOHEXOL 350 MG/ML SOLN COMPARISON:  CT chest with contrast 07/23/2019 scratched at CT of the chest, abdomen and pelvis with  contrast 07/23/2019 FINDINGS: CTA CHEST FINDINGS Cardiovascular: Heart is enlarged. Atherosclerotic calcifications are present at the aortic arch and great vessel origins. New significant stenosis or aneurysm is present. Pulmonary artery opacification is excellent. No focal filling defects are present to suggest pulmonary emboli. Pulmonary arteries are enlarged. Mediastinum/Nodes: No enlarged mediastinal, hilar, or axillary lymph nodes. Thyroid gland, trachea, and esophagus demonstrate no significant findings. Lungs/Pleura: The right main pulmonary artery measures up to 25 mm. Left main pulmonary artery measures up to 26 mm. Right middle lobe and right lower lobe pulmonary nodules on images 40 and 41 of series 6 are stable. Mild dependent airspace disease likely reflects atelectasis. Asymmetric dependent airspace disease is present at the left base. Some asymmetric airspace disease is noted posteriorly in the right upper lobe is well. No significant pleural effusion or pneumothorax is present. Musculoskeletal: No chest wall abnormality. No acute or significant osseous findings. Review of the MIP images confirms the above findings. CT ABDOMEN and PELVIS FINDINGS Hepatobiliary: No focal liver abnormality is seen. Status post cholecystectomy. No biliary dilatation. Pancreas: Unremarkable. No pancreatic ductal dilatation or surrounding inflammatory changes. Spleen: Normal in size without focal abnormality. Adrenals/Urinary Tract: 2 simple cysts are present in the left kidney. The larger measures 10 mm. 5 mm nonobstructing stone is present in the interpolar region of the right kidney. A 7 mm nonobstructing stone is present near the upper pole of the left kidney. Two 5 mm stones are present in the upper and lower poles of the left kidney respectively. At least 2 other punctate nonobstructing stones are present near the upper pole of the left kidney. No solid mass lesion is present. Ureters are within normal limits  bilaterally. No obstruction is present. The urinary bladder is within normal limits. Stomach/Bowel: The stomach and duodenum are within normal limits. Small bowel is unremarkable. Terminal ileum is normal. The appendix is visualized and within normal limits. The ascending and transverse colon are within normal limits. Diverticular changes are present the distal descending and sigmoid colon. No inflammatory changes are present to suggest diverticulitis. Vascular/Lymphatic: Atherosclerotic calcifications are present in the aorta and branch vessels. No aneurysm is present. Reproductive: 21 mm left adnexal cyst is stable. Uterus and adnexa are otherwise unremarkable. Other: Choose 1 Musculoskeletal: Left hemipelvectomy stable. Degenerative changes are noted at the right SI joint. No focal  osseous lesions are present. Degenerative changes are present in the lower lumbar spine. Review of the MIP images confirms the above findings. IMPRESSION: 1. No pulmonary embolus. 2. Asymmetric airspace disease at the left base and posteriorly in the right upper lobe concerning for pneumonia. 3. Cardiomegaly with enlargement of the main pulmonary arteries suggesting pulmonary arterial hypertension. 4. Stable right middle lobe and right lower lobe pulmonary nodules. 5. Bilateral nonobstructing renal stones. 6. Descending and sigmoid diverticulosis without evidence for diverticulitis. 7. Stable 21 mm left adnexal cyst. No follow-up imaging recommended. Note: This recommendation does not apply to premenarchal patients and to those with increased risk (genetic, family history, elevated tumor markers or other high-risk factors) of ovarian cancer. Reference: JACR 2020 Feb; 17(2):248-254 8. Degenerative changes in the lower lumbar spine. 9. Aortic Atherosclerosis (ICD10-I70.0). Electronically Signed   By: San Morelle M.D.   On: 12/15/2021 12:17   CT ABDOMEN PELVIS W CONTRAST  Result Date: 12/15/2021 CLINICAL DATA:  Pulmonary  embolism suspected, high probability. Abdominal pain, acute, nonlocalized. Hypoxia EXAM: CT ANGIOGRAPHY CHEST CT ABDOMEN AND PELVIS WITH CONTRAST TECHNIQUE: Multidetector CT imaging of the chest was performed using the standard protocol during bolus administration of intravenous contrast. Multiplanar CT image reconstructions and MIPs were obtained to evaluate the vascular anatomy. Multidetector CT imaging of the abdomen and pelvis was performed using the standard protocol during bolus administration of intravenous contrast. RADIATION DOSE REDUCTION: This exam was performed according to the departmental dose-optimization program which includes automated exposure control, adjustment of the mA and/or kV according to patient size and/or use of iterative reconstruction technique. CONTRAST:  40mL OMNIPAQUE IOHEXOL 350 MG/ML SOLN COMPARISON:  CT chest with contrast 07/23/2019 scratched at CT of the chest, abdomen and pelvis with contrast 07/23/2019 FINDINGS: CTA CHEST FINDINGS Cardiovascular: Heart is enlarged. Atherosclerotic calcifications are present at the aortic arch and great vessel origins. New significant stenosis or aneurysm is present. Pulmonary artery opacification is excellent. No focal filling defects are present to suggest pulmonary emboli. Pulmonary arteries are enlarged. Mediastinum/Nodes: No enlarged mediastinal, hilar, or axillary lymph nodes. Thyroid gland, trachea, and esophagus demonstrate no significant findings. Lungs/Pleura: The right main pulmonary artery measures up to 25 mm. Left main pulmonary artery measures up to 26 mm. Right middle lobe and right lower lobe pulmonary nodules on images 40 and 41 of series 6 are stable. Mild dependent airspace disease likely reflects atelectasis. Asymmetric dependent airspace disease is present at the left base. Some asymmetric airspace disease is noted posteriorly in the right upper lobe is well. No significant pleural effusion or pneumothorax is present.  Musculoskeletal: No chest wall abnormality. No acute or significant osseous findings. Review of the MIP images confirms the above findings. CT ABDOMEN and PELVIS FINDINGS Hepatobiliary: No focal liver abnormality is seen. Status post cholecystectomy. No biliary dilatation. Pancreas: Unremarkable. No pancreatic ductal dilatation or surrounding inflammatory changes. Spleen: Normal in size without focal abnormality. Adrenals/Urinary Tract: 2 simple cysts are present in the left kidney. The larger measures 10 mm. 5 mm nonobstructing stone is present in the interpolar region of the right kidney. A 7 mm nonobstructing stone is present near the upper pole of the left kidney. Two 5 mm stones are present in the upper and lower poles of the left kidney respectively. At least 2 other punctate nonobstructing stones are present near the upper pole of the left kidney. No solid mass lesion is present. Ureters are within normal limits bilaterally. No obstruction is present. The urinary bladder is within normal  limits. Stomach/Bowel: The stomach and duodenum are within normal limits. Small bowel is unremarkable. Terminal ileum is normal. The appendix is visualized and within normal limits. The ascending and transverse colon are within normal limits. Diverticular changes are present the distal descending and sigmoid colon. No inflammatory changes are present to suggest diverticulitis. Vascular/Lymphatic: Atherosclerotic calcifications are present in the aorta and branch vessels. No aneurysm is present. Reproductive: 21 mm left adnexal cyst is stable. Uterus and adnexa are otherwise unremarkable. Other: Choose 1 Musculoskeletal: Left hemipelvectomy stable. Degenerative changes are noted at the right SI joint. No focal osseous lesions are present. Degenerative changes are present in the lower lumbar spine. Review of the MIP images confirms the above findings. IMPRESSION: 1. No pulmonary embolus. 2. Asymmetric airspace disease at the  left base and posteriorly in the right upper lobe concerning for pneumonia. 3. Cardiomegaly with enlargement of the main pulmonary arteries suggesting pulmonary arterial hypertension. 4. Stable right middle lobe and right lower lobe pulmonary nodules. 5. Bilateral nonobstructing renal stones. 6. Descending and sigmoid diverticulosis without evidence for diverticulitis. 7. Stable 21 mm left adnexal cyst. No follow-up imaging recommended. Note: This recommendation does not apply to premenarchal patients and to those with increased risk (genetic, family history, elevated tumor markers or other high-risk factors) of ovarian cancer. Reference: JACR 2020 Feb; 17(2):248-254 8. Degenerative changes in the lower lumbar spine. 9. Aortic Atherosclerosis (ICD10-I70.0). Electronically Signed   By: San Morelle M.D.   On: 12/15/2021 12:17   DG Chest Port 1 View  Result Date: 12/15/2021 CLINICAL DATA:  88 year old female with possible sepsis. EXAM: PORTABLE CHEST 1 VIEW COMPARISON:  Chest x-ray 10/02/2017. FINDINGS: Lung volumes are low. Blunting of the left costophrenic sulcus. Opacity at the left base which may reflect atelectasis and/or consolidation. Diffuse interstitial prominence and peribronchial cuffing, concerning for an acute bronchitis. No pneumothorax. Heart size is upper limits of normal. The patient is rotated to the left on today's exam, resulting in distortion of the mediastinal contours and reduced diagnostic sensitivity and specificity for mediastinal pathology. IMPRESSION: 1. Findings are concerning for severe acute bronchitis, likely with developing left lower lobe bronchopneumonia and small parapneumonic pleural effusion. Followup PA and lateral chest X-ray is recommended in 3-4 weeks following trial of antibiotic therapy to ensure resolution and exclude underlying malignancy. Electronically Signed   By: Vinnie Langton M.D.   On: 12/15/2021 10:29    Microbiology: Recent Results (from the past  240 hour(s))  Blood Culture (routine x 2)     Status: Abnormal   Collection Time: 12/15/21 10:11 AM   Specimen: BLOOD  Result Value Ref Range Status   Specimen Description   Final    BLOOD BLOOD LEFT FOREARM Performed at [facility], [address] [place], [place] [address]    Special Requests   Final    BOTTLES DRAWN AEROBIC AND ANAEROBIC Blood Culture adequate volume Performed at [facility], [address], [place], [place] [address]    Culture  Setup Time   Final    GRAM NEGATIVE RODS AEROBIC BOTTLE ONLY CRITICAL VALUE NOTED.  VALUE IS CONSISTENT WITH PREVIOUSLY REPORTED AND CALLED VALUE. Performed at [facility], [address] [place], [place] [address]    Culture (A)  Final    ESCHERICHIA COLI SUSCEPTIBILITIES PERFORMED ON PREVIOUS CULTURE WITHIN THE LAST 5 DAYS. Performed at [facility], [place] [address], [place], [place] [address]    Report Status 12/19/2021 FINAL  Final  Blood Culture (routine  x 2)     Status: Abnormal   Collection Time: 12/15/21 10:11 AM   Specimen: BLOOD  Result Value Ref Range Status   Specimen Description   Final    BLOOD LEFT ANTECUBITAL Performed at [facility], [address] [place], [place] [address]    Special Requests   Final    BOTTLES DRAWN AEROBIC AND ANAEROBIC Blood Culture results may not be optimal due to an inadequate volume of blood received in culture bottles Performed at [facility], [address], [place], [place] [address]    Culture  Setup Time   Final    GRAM NEGATIVE RODS IN BOTH AEROBIC AND ANAEROBIC BOTTLES CRITICAL RESULT CALLED TO, READ BACK BY AND VERIFIED WITH: NATHAN BLUE 12/15/21 2247 DE GRAM STAIN REVIEWED-AGREE WITH RESULT Performed at [facility], [address] [place], [place] [address]    Culture ESCHERICHIA COLI (A)  Final   Report Status 12/18/2021 FINAL  Final   Organism ID, Bacteria ESCHERICHIA COLI  Final      Susceptibility    Escherichia coli - MIC*    AMPICILLIN <=2 SENSITIVE Sensitive     CEFAZOLIN <=4 SENSITIVE Sensitive     CEFEPIME <=0.12 SENSITIVE Sensitive     CEFTAZIDIME <=1 SENSITIVE Sensitive     CEFTRIAXONE <=0.25 SENSITIVE Sensitive     CIPROFLOXACIN <=0.25 SENSITIVE Sensitive     GENTAMICIN <=1 SENSITIVE Sensitive     IMIPENEM <=0.25 SENSITIVE Sensitive     TRIMETH/SULFA <=20 SENSITIVE Sensitive     AMPICILLIN/SULBACTAM <=2 SENSITIVE Sensitive     PIP/TAZO <=4 SENSITIVE Sensitive     * ESCHERICHIA COLI  SARS Coronavirus 2 by RT PCR (hospital order, performed in [facility] hospital lab) *cepheid single result test* Anterior Nasal Swab     Status: None   Collection Time: 12/15/21 10:11 AM   Specimen: Anterior Nasal Swab  Result Value Ref Range Status   SARS Coronavirus 2 by RT PCR NEGATIVE NEGATIVE Final    Comment: (NOTE) SARS-CoV-2 target nucleic acids are NOT DETECTED.  The SARS-CoV-2 RNA is generally detectable in upper and lower respiratory specimens during the acute phase of infection. The lowest concentration of SARS-CoV-2 viral copies this assay can detect is 250 copies / mL. A negative result does not preclude SARS-CoV-2 infection and should not be used as the sole basis for treatment or other patient management decisions.  A negative result may occur with improper specimen collection / handling, submission of specimen other than nasopharyngeal swab, presence of viral mutation(s) within the areas targeted by this assay, and inadequate number of viral copies (<250 copies / mL). A negative result must be combined with clinical observations, patient history, and epidemiological information.  Fact Sheet for Patients:   RoadLapTop.co.za  Fact Sheet for Healthcare Providers: http://kim-miller.com/  This test is not yet approved or  cleared by the [place] FDA and has been authorized for detection and/or diagnosis of SARS-CoV-2  by FDA under an Emergency Use Authorization (EUA).  This EUA will remain in effect (meaning this test can be used) for the duration of the COVID-19 declaration under Section 564(b)(1) of the Act, 21 U.S.C. section 360bbb-3(b)(1), unless the authorization is terminated or revoked sooner.  Performed at [facility], [address] [place], [place] [address]   Group A Strep by PCR     Status: None   Collection Time: 12/15/21 10:11 AM   Specimen: Throat; Sterile Swab  Result Value Ref Range Status  Group A Strep by PCR NOT DETECTED NOT DETECTED Final    Comment: Performed at [facility], [address] [place], [place] [address]  Blood Culture ID Panel (Reflexed)     Status: Abnormal   Collection Time: 12/15/21 10:11 AM  Result Value Ref Range Status   Enterococcus faecalis NOT DETECTED NOT DETECTED Final   Enterococcus Faecium NOT DETECTED NOT DETECTED Final   Listeria monocytogenes NOT DETECTED NOT DETECTED Final   Staphylococcus species NOT DETECTED NOT DETECTED Final   Staphylococcus aureus (BCID) NOT DETECTED NOT DETECTED Final   Staphylococcus epidermidis NOT DETECTED NOT DETECTED Final   Staphylococcus lugdunensis NOT DETECTED NOT DETECTED Final   Streptococcus species NOT DETECTED NOT DETECTED Final   Streptococcus agalactiae NOT DETECTED NOT DETECTED Final   Streptococcus pneumoniae NOT DETECTED NOT DETECTED Final   Streptococcus pyogenes NOT DETECTED NOT DETECTED Final   A.calcoaceticus-baumannii NOT DETECTED NOT DETECTED Final   Bacteroides fragilis NOT DETECTED NOT DETECTED Final   Enterobacterales DETECTED (A) NOT DETECTED Final    Comment: Enterobacterales represent a large order of gram negative bacteria, not a single organism. CRITICAL RESULT CALLED TO, READ BACK BY AND VERIFIED WITH: NATHAN BELUE PHARMD AT 2247 12/15/2021 DE    Enterobacter cloacae complex NOT DETECTED NOT DETECTED Final   Escherichia coli DETECTED (A) NOT DETECTED Final     Comment: CRITICAL RESULT CALLED TO, READ BACK BY AND VERIFIED WITH: NATHAN BELUE PAHRMD AT 2247 12/15/2021 2247    Klebsiella aerogenes NOT DETECTED NOT DETECTED Final   Klebsiella oxytoca NOT DETECTED NOT DETECTED Final   Klebsiella pneumoniae NOT DETECTED NOT DETECTED Final   Proteus species NOT DETECTED NOT DETECTED Final   Salmonella species NOT DETECTED NOT DETECTED Final   Serratia marcescens NOT DETECTED NOT DETECTED Final   Haemophilus influenzae NOT DETECTED NOT DETECTED Final   Neisseria meningitidis NOT DETECTED NOT DETECTED Final   Pseudomonas aeruginosa NOT DETECTED NOT DETECTED Final   Stenotrophomonas maltophilia NOT DETECTED NOT DETECTED Final   Candida albicans NOT DETECTED NOT DETECTED Final   Candida auris NOT DETECTED NOT DETECTED Final   Candida glabrata NOT DETECTED NOT DETECTED Final   Candida krusei NOT DETECTED NOT DETECTED Final   Candida parapsilosis NOT DETECTED NOT DETECTED Final   Candida tropicalis NOT DETECTED NOT DETECTED Final   Cryptococcus neoformans/gattii NOT DETECTED NOT DETECTED Final   CTX-M ESBL NOT DETECTED NOT DETECTED Final   Carbapenem resistance IMP NOT DETECTED NOT DETECTED Final   Carbapenem resistance KPC NOT DETECTED NOT DETECTED Final   Carbapenem resistance NDM NOT DETECTED NOT DETECTED Final   Carbapenem resist OXA 48 LIKE NOT DETECTED NOT DETECTED Final   Carbapenem resistance VIM NOT DETECTED NOT DETECTED Final    Comment: Performed at [facility], [address] [place], [place] [address]  Urine Culture     Status: Abnormal   Collection Time: 12/16/21 10:45 AM   Specimen: In/Out Cath Urine  Result Value Ref Range Status   Specimen Description   Final    IN/OUT CATH URINE Performed at [facility], [address] [place], [place] [address]    Special Requests   Final    NONE Performed at [facility], [address], [place], [place] [address]    Culture (A)  Final    <10,000 COLONIES/mL  INSIGNIFICANT GROWTH Performed at [facility], [place] [address], [place], [place] [address]    Report Status 12/17/2021 FINAL  Final  MRSA Next  Gen by PCR, Nasal     Status: None   Collection Time: 12/16/21 10:45 AM   Specimen: Nasal Mucosa; Nasal Swab  Result Value Ref Range Status   MRSA by PCR Next Gen NOT DETECTED NOT DETECTED Final    Comment: (NOTE) The GeneXpert MRSA Assay (FDA approved for NASAL specimens only), is one component of a comprehensive MRSA colonization surveillance program. It is not intended to diagnose MRSA infection nor to guide or monitor treatment for MRSA infections. Test performance is not FDA approved in patients less than 55 years old. Performed at [facility], [place], [place] [address]   C Difficile Quick Screen w PCR reflex     Status: None   Collection Time: 12/17/21  4:30 PM   Specimen: STOOL  Result Value Ref Range Status   C Diff antigen NEGATIVE NEGATIVE Final   C Diff toxin NEGATIVE NEGATIVE Final   C Diff interpretation No C. difficile detected.  Final    Comment: Performed at [facility], [address] [place], [place] [address]     Labs: Basic Metabolic Panel: Recent Labs  Lab 12/15/21 1011 12/16/21 0529 12/17/21 0533 12/18/21 1207 12/19/21 0621  NA 132* 135 136  --  139  K 4.0 3.3* 3.5  --  3.6  CL 102 107 109  --  112*  CO2 20* 22 20*  --  20*  GLUCOSE 332* 194* 199*  --  243*  BUN 25* 27* 28*  --  21  CREATININE 1.04* 0.72 0.61  --  0.53  CALCIUM 9.8 9.5 9.5  --  9.4  MG  --   --  1.5* 1.8  --    Liver Function Tests: Recent Labs  Lab 12/15/21 1011 12/17/21 0533  AST 33 25  ALT 17 26  ALKPHOS 43 45  BILITOT 0.5 0.5  PROT 6.8 6.0*  ALBUMIN 3.3* 2.7*   Recent Labs  Lab 12/15/21 1023  LIPASE 22   No results for input(s): "AMMONIA" in the last 168 hours. CBC: Recent Labs  Lab 12/15/21 1011 12/15/21 1545 12/16/21 0529 12/17/21 0533  WBC 16.7* 15.3* 12.3* 8.6   NEUTROABS 14.4* 13.0*  --   --   HGB 11.7* 10.9* 10.5* 10.9*  HCT 39.7 36.4 34.8* 36.4  MCV 82.7 82.0 81.7 81.3  PLT 167 156 127* 125*   Cardiac Enzymes: No results for input(s): "CKTOTAL", "CKMB", "CKMBINDEX", "TROPONINI" in the last 168 hours. BNP: BNP (last 3 results) Recent Labs    12/15/21 1016  BNP 373.4*    ProBNP (last 3 results) No results for input(s): "PROBNP" in the last 8760 hours.  CBG: Recent Labs  Lab 12/18/21 1203 12/18/21 1213 12/18/21 1739 12/19/21 0719 12/19/21 1146  GLUCAP 158* 153* 203* 198* 227*       Signed:  Desma Maxim MD.  [facility] 12/19/2021, 1:25 PM

## 2021-12-19 NOTE — TOC Transition Note (Signed)
Transition of Care [facility]) - CM/SW Discharge Note   Patient Details  Name: HAZLEY DEZEEUW MRN: [id number] Date of Birth: [birth year]  Transition of Care [facility]) CM/SW Contact:  Candie Chroman, LCSW Phone Number: 12/19/2021, 3:36 PM   Clinical Narrative: Patient has orders to discharge to [facility] today. RN will call report to [phone] (Room 215). EMS transport has been arranged. No further concerns. CSW signing off.    Final next level of care: Skilled Nursing Facility Barriers to Discharge: Barriers Resolved   Patient Goals and CMS Choice     Choice offered to / list presented to : NA  Discharge Placement   Existing PASRR number confirmed : 12/17/21          Patient chooses bed at: [facility] Patient to be transferred to facility by: EMS Name of family member notified: Liane Comber Patient and family notified of of transfer: 12/19/21  Discharge Plan and Services     Post Acute Care Choice: Resumption of Svcs/PTA Provider                               Social Determinants of Health (SDOH) Interventions     Readmission Risk Interventions     No data to display

## 2021-12-19 NOTE — TOC Progression Note (Signed)
Transition of Care [facility]) - Progression Note    Patient Details  Name: KIMANH TEMPLEMAN MRN: [id number] Date of Birth: Jun 16, 1933  Transition of Care [facility]) CM/SW [place], LCSW Phone Number: 12/19/2021, 1:12 PM  Clinical Narrative:   Received call back from daughter. She is agreeable to rehab at [facility] if insurance will cover. They can accept her back while still pending but patient will have to private pay. Daughter is aware. Started Josem Kaufmann through [place] portal.  Expected Discharge Plan: [place] Barriers to Discharge: Continued Medical Work up  Expected Discharge Plan and Services Expected Discharge Plan: [place] Acute Care Choice: Resumption of Svcs/PTA Provider Living arrangements for the past 2 months: Skilled Nursing Facility                                       Social Determinants of Health (SDOH) Interventions    Readmission Risk Interventions     No data to display

## 2021-12-20 DIAGNOSIS — J069 Acute upper respiratory infection, unspecified: Secondary | ICD-10-CM

## 2021-12-20 DIAGNOSIS — I4891 Unspecified atrial fibrillation: Secondary | ICD-10-CM

## 2021-12-20 DIAGNOSIS — J189 Pneumonia, unspecified organism: Secondary | ICD-10-CM

## 2021-12-20 DIAGNOSIS — N390 Urinary tract infection, site not specified: Secondary | ICD-10-CM

## 2021-12-20 DIAGNOSIS — I214 Non-ST elevation (NSTEMI) myocardial infarction: Secondary | ICD-10-CM

## 2021-12-24 DIAGNOSIS — M25531 Pain in right wrist: Secondary | ICD-10-CM

## 2022-01-08 ENCOUNTER — Non-Acute Institutional Stay (SKILLED_NURSING_FACILITY): Payer: Medicare Other | Admitting: Student

## 2022-01-08 DIAGNOSIS — F341 Dysthymic disorder: Secondary | ICD-10-CM | POA: Diagnosis not present

## 2022-01-08 DIAGNOSIS — I10 Essential (primary) hypertension: Secondary | ICD-10-CM

## 2022-01-08 DIAGNOSIS — D735 Infarction of spleen: Secondary | ICD-10-CM

## 2022-01-08 DIAGNOSIS — Z87898 Personal history of other specified conditions: Secondary | ICD-10-CM

## 2022-01-08 NOTE — Progress Notes (Signed)
Location:   [facility]) Nursing Home Room Number: 215 Place of Service:  Nursing [phone]) Provider:  Loma Sender, Doctors Making  Patient Care Team: [facility]calls, Doctors Making as PCP - General (Geriatric Medicine) Robert Bellow, MD (General Surgery) Lloyd Huger, MD as Consulting Physician (Oncology)  Extended Emergency Contact Information Primary Emergency Contact: Elwin Mocha Address: [address]          [place], [place] [address] Johnnette Litter of [place] Phone: [phone] Mobile Phone: [phone] Relation: Daughter  Code Status:  DNR Goals of care: Advanced Directive information    12/19/2021    1:54 PM  Advanced Directives  Does Patient Have a Medical Advance Directive? Yes  Type of Advance Directive [place]  Does patient want to make changes to medical advance directive? Yes (ED - Information included in AVS)  Copy of [place] in Chart? Yes - validated most recent copy scanned in chart (See row information)  Pre-existing out of facility DNR order (yellow form or pink MOST form) Pink MOST form placed in chart (order not valid for inpatient use)    Chief Complaint  Patient presents with   Depression    HPI:  Pt is a 88 y.o. female seen today for an acute visit for poor sleep. She is in a period of depression because she has been aching and is now on oxygen which have affected her daily life. Her oxygen is after she was admitted for pneumonia ~1 mo ago. She uses a lift to get on the toilet  and into the chair. It takes a lot to take care of herself and that is sad for her. She has a daughter about 1 hour away so she isn't able to visit as oftne. She also gets panic atacks-- since her leg amputation. She has crying spells. Some days she wonders if she "might as well die." Her mood improves with activites, she tries to do most of the activities and enjoys playing cards. She fell asleep in the TV room  last night. She denies dysuria,   She has a daughter who said she doesn't want ot be a part of the family anymore. She has an ex husband who is remarried an dlives near by. She has lost her parents and some family that lived in [facility].   Since her divorce she has slept with hte TV on. She has tried a Set designer before, and that was frustrating. She has been in the nursing home for the last year since she fell 2x at home. 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 Nutrition: she was told she needs to increase protein in her body and has a protein powder she takes.   Spoke with her daughter Ledell Noss for 20 minutes discussing the following: She has been on antidepressants for She was taken off of her medications and within 48 hours had issues. Her daughter also says she used to sleep with the radio on. Nursing and her daughter are worried about how her mood has been. She has become more tearful than before. Her mom has phantom pains. Unclear if she has had incontinence since hospitalization. From her belly button down she doesn't have the same sensation as normal. She had a UTI with frank hematuria and didn't have any symptoms. Recent thyroid labs were abnormal. She was delirious in the hospital. Patient was  clinically cleared, but then remained confused during hospitalization. She still has not returned to baseline.   Past Medical History:  Diagnosis Date   Anxiety    Benign breast lumps    Blood clotting disorder (HCC)    Bone cancer (HCC)    head of femur, left leg ... amputation at age 19   Breast cancer of upper-outer quadrant of left female breast ([place]) 07/27/2017   11 mm invasive mammary carcinoma, ER 90%, PR 51-90%, negative margins.  Oncotype recurrence score: 1.  DCIS, margin less than 0.5 mm.  Negative sentinel node.  Accelerated partial breast radiation.   Cervicalgia    Chronic kidney disease    kidney stones    Colon cancer [facility])    patient unaware of this   Complication of anesthesia    mood alteration / not sure if d/t pain medicine or anesthesia as she passed out    Cough    NASAL DRIP / SNEEZING / SORE THROAT MOSTLY CONSTANT   Depression    Diabetes ([place])    Diverticulitis    Dizzy    GERD (gastroesophageal reflux disease)    H/O blood clots    arm and leg   HBP (high blood pressure)    Hematuria    gross   Hemorrhoids    HLD (hyperlipidemia)    Muscle pain    Osteoarthritis    Osteoporosis    Panic disorder    Personal history of radiation therapy    PND (post-nasal drip)    CHRONIC WITH SORE THROAT AND SNEEZING   Reflux    Skin cancer    nose   Swelling    Tremor    Tremors of nervous system    Past Surgical History:  Procedure Laterality Date   APPENDECTOMY  1962   BREAST BIOPSY Right 2006?   benign   BREAST BIOPSY Left 2019   invasive mammary carcinoma   BREAST CYST EXCISION Left 07/27/2017   Procedure: SKIN CYST EXCISED;  Surgeon: Robert Bellow, MD;  Location: [facility] ORS;  Service: General;  Laterality: Left;   BREAST LUMPECTOMY Left 07/2017   invasive mammary, DCIS  mammosite   BREAST SURGERY Right 2019   CARPAL TUNNEL RELEASE Right    CATARACT EXTRACTION W/PHACO Left 11/11/2016   Procedure: CATARACT EXTRACTION PHACO AND INTRAOCULAR LENS PLACEMENT ([place]);  Surgeon: Birder Robson, MD;  Location: [facility] ORS;  Service: Ophthalmology;  Laterality: Left;  [place] 01:07.9 AP% 19.2 CDE 13.02 Fluid pack lot # [id number]   CATARACT EXTRACTION W/PHACO Right 12/09/2016   Procedure: CATARACT EXTRACTION PHACO AND INTRAOCULAR LENS PLACEMENT (I[facility]);  Surgeon: Birder Robson, MD;  Location: [facility] ORS;  Service: Ophthalmology;  Laterality: Right;  [place] 00:49 AP% 21.2 CDE 10.39 Fluid pack lot # [id number]   CHOLECYSTECTOMY     COLONOSCOPY WITH PROPOFOL N/A 11/22/2014   Procedure: COLONOSCOPY WITH PROPOFOL;  Surgeon: Manya Silvas, MD;  Location: [facility] ENDOSCOPY;  Service:  Endoscopy;  Laterality: N/A;   CYSTOSCOPY WITH STENT PLACEMENT Bilateral 11/27/2018   Procedure: CYSTOSCOPY WITH STENT PLACEMENT;  Surgeon: Festus Aloe, MD;  Location: [facility] ORS;  Service: Urology;  Laterality: Bilateral;   CYSTOSCOPY/URETEROSCOPY/HOLMIUM LASER/STENT PLACEMENT Bilateral 12/17/2018   Procedure: CYSTOSCOPY/URETEROSCOPY/HOLMIUM LASER/STENT Exchange;  Surgeon: Billey Co, MD;  Location: [facility] ORS;  Service: Urology;  Laterality: Bilateral;   ESOPHAGOGASTRODUODENOSCOPY  11/22/2014   Procedure: ESOPHAGOGASTRODUODENOSCOPY (EGD);  Surgeon: Manya Silvas, MD;  Location: [facility] ENDOSCOPY;  Service: Endoscopy;;   ESOPHAGOGASTRODUODENOSCOPY (EGD) WITH PROPOFOL N/A 03/05/2017  Procedure: ESOPHAGOGASTRODUODENOSCOPY (EGD) WITH PROPOFOL;  Surgeon: Lin Landsman, MD;  Location: [facility] ENDOSCOPY;  Service: Gastroenterology;  Laterality: N/A;   EXTRACORPOREAL SHOCK WAVE LITHOTRIPSY Right 03/04/2018   Procedure: EXTRACORPOREAL SHOCK WAVE LITHOTRIPSY (ESWL);  Surgeon: Billey Co, MD;  Location: [facility] ORS;  Service: Urology;  Laterality: Right;   EYE SURGERY Bilateral 2012   cataract extraction with iol   GALLBLADDER SURGERY  1968   hemi pelvectomy     left side at age 64   HEMIPELVIC Left [place] Left    AMPUTATION d/t cancer at 88 years old   MASTECTOMY, PARTIAL Left 07/27/2017   Procedure: MASTECTOMY PARTIAL;  Surgeon: Robert Bellow, MD;  Location: [facility] ORS;  Service: General;  Laterality: Left;   MOUTH SURGERY  2019   teeth pulled with a bridge insertion.   fell out 12/16/18   OVARY SURGERY Right    cyst removed   SAVORY DILATION  11/22/2014   Procedure: SAVORY DILATION;  Surgeon: Manya Silvas, MD;  Location: [facility] ENDOSCOPY;  Service: Endoscopy;;   SENTINEL NODE BIOPSY Left 07/27/2017   Procedure: SENTINEL NODE BIOPSY;  Surgeon: Robert Bellow, MD;  Location: [facility] ORS;  Service: General;  Laterality: Left;   SKIN CANCER EXCISION     nose and arm and  face   TEE WITHOUT CARDIOVERSION N/A 07/25/2019   Procedure: TRANSESOPHAGEAL ECHOCARDIOGRAM (TEE);  Surgeon: Corey Skains, MD;  Location: [facility] ORS;  Service: Cardiovascular;  Laterality: N/A;   TONSILLECTOMY      Allergies  Allergen Reactions   Ciprofloxacin Hives   Codeine Other (See Comments)    HALLUCINATIONS   Tape     Rash and skin irritation/ paper tape and tegaderm OK   Ciprocinonide [Fluocinolone] Other (See Comments)    unknown   Amoxicillin Other (See Comments)    Upset stomach Has patient had a PCN reaction causing immediate rash, facial/tongue/throat swelling, SOB or lightheadedness with hypotension: No Has patient had a PCN reaction causing severe rash involving mucus membranes or skin necrosis: No Has patient had a PCN reaction that required hospitalization: No Has patient had a PCN reaction occurring within the last 10 years: Yes If all of the above answers are "NO", then may proceed with Cephalosporin use.;   Cefuroxime Other (See Comments)    OK INTRACAMERALLY PER DR WLP, upset stomach   Latex Rash    Rast test NEGATIVE   Lipitor [Atorvastatin] Other (See Comments)    unknown    Outpatient Encounter Medications as of 01/08/2022  Medication Sig   acetaminophen (TYLENOL) 500 MG tablet Take 500 mg by mouth every 6 (six) hours as needed.   acetaminophen (TYLENOL) 650 MG CR tablet Take 650 mg by mouth every 8 (eight) hours as needed for pain.   Biotin 10 MG TABS Take 10 mg by mouth at bedtime.   Blood Glucose Monitoring Suppl (FIFTY50 GLUCOSE METER 2.0) w/Device KIT See admin instructions. (Patient not taking: Reported on 01/22/2021)   Cholecalciferol 25 MCG (1000 UT) tablet Take 1,000 Units by mouth daily.   Dentifrices (BIOTENE DRY MOUTH DT) by Transmucosal route.   diclofenac Sodium (VOLTAREN) 1 % GEL Apply 4 g topically 4 (four) times daily.   gabapentin (NEURONTIN) 100 MG capsule Take 100 mg by mouth 3 (three) times daily.   GLIPIZIDE XL 2.5 MG 24 hr tablet  Take 2.5 mg by mouth daily.    letrozole (FEMARA) 2.5 MG tablet TAKE ONE TABLET BY MOUTH EVERY DAY  loratadine (CLARITIN) 10 MG tablet Take 10 mg by mouth daily.  (Patient not taking: Reported on 01/22/2021)   metFORMIN (GLUCOPHAGE) 500 MG tablet Take 500 mg by mouth daily with breakfast.   metoprolol tartrate (LOPRESSOR) 25 MG tablet Take 1 tablet (25 mg total) by mouth 3 (three) times daily.   Multiple Vitamin (MULTIVITAMIN WITH MINERALS) TABS tablet Take 1 tablet by mouth daily.   nystatin (MYCOSTATIN/NYSTOP) powder Apply 1 application  topically. Apply to right groin daily   omeprazole (PRILOSEC) 40 MG capsule Take 40 mg by mouth daily.    PARoxetine (PAXIL) 30 MG tablet Take 30 mg by mouth daily.   Polyethyl Glycol-Propyl Glycol 0.4-0.3 % SOLN Apply to eye 2 (two) times daily as needed.   PRADAXA 150 MG CAPS capsule TAKE 1 CAPSULE BY MOUTH TWICE DAILY START TREATMENT 10 HRS LAST DOSE OF LOVENOX (Patient taking differently: Take 150 mg by mouth 2 (two) times daily.)   tiZANidine (ZANAFLEX) 2 MG tablet Take 2 mg by mouth every 12 (twelve) hours as needed for muscle spasms.   No facility-administered encounter medications on file as of 01/08/2022.    Review of Systems  Constitutional:  Negative for chills, fatigue and fever.  Respiratory:  Negative for shortness of breath and wheezing.        Wears oxygen, not feeling short of breath   Cardiovascular:  Negative for chest pain.  Gastrointestinal:  Negative for abdominal distention and abdominal pain.  Genitourinary:  Negative for difficulty urinating and dysuria.  Psychiatric/Behavioral:  Positive for sleep disturbance.     Immunization History  Administered Date(s) Administered   Pneumococcal Conjugate-13 09/28/2017   Pertinent  Health Maintenance Due  Topic Date Due   OPHTHALMOLOGY EXAM  Never done   URINE MICROALBUMIN  Never done   FOOT EXAM  11/16/2020   INFLUENZA VACCINE  Never done   HEMOGLOBIN A1C  06/17/2022   DEXA SCAN   Completed      12/18/2021    7:05 AM 12/18/2021    9:00 AM 12/18/2021    7:45 PM 12/19/2021    7:00 AM 12/19/2021   12:00 PM  Fall Risk  Patient Fall Risk Level _0    Functional Status Survey:    Vitals:   01/08/22 0836  BP: (!) 156/62  Pulse: 85  Resp: (!) 22  Temp: 97.6 F (36.4 C)  SpO2: 94%  Weight: 170 lb 3.2 oz (77.2 kg)   Body mass index is 33.24 kg/m. Physical Exam Constitutional:      Appearance: She is obese.  Cardiovascular:     Rate and Rhythm: Normal rate.     Pulses: Normal pulses.     Heart sounds: Normal heart sounds.  Pulmonary:     Effort: Pulmonary effort is normal.     Breath sounds: Normal breath sounds.  Abdominal:     General: Bowel sounds are normal.     Palpations: Abdomen is soft.  Musculoskeletal:     Comments: Left leg hemipelvectomy, r leg with brace no pitting edema  Neurological:     General: No focal deficit present.     Mental Status: She is alert and oriented to person, place, and time.     Labs reviewed: Recent Labs    12/16/21 0529 12/17/21 0533 12/18/21 1207 12/19/21 0621  NA 135 136  --  139  K 3.3* 3.5  --  3.6  CL 107 109  --  112*  CO2 22 20*  --  20*  GLUCOSE 194* 199*  --  243*  BUN 27* 28*  --  21  CREATININE 0.72 0.61  --  0.53  CALCIUM 9.5 9.5  --  9.4  MG  --  1.5* 1.8  --    Recent Labs    12/15/21 1011 12/17/21 0533  AST 33 25  ALT 17 26  ALKPHOS 43 45  BILITOT 0.5 0.5  PROT 6.8 6.0*  ALBUMIN 3.3* 2.7*   Recent Labs    07/23/21 1026 12/15/21 1011 12/15/21 1545 12/16/21 0529 12/17/21 0533  WBC 8.9 16.7* 15.3* 12.3* 8.6  NEUTROABS 6.0 14.4* 13.0*  --   --   HGB 11.6* 11.7* 10.9* 10.5* 10.9*  HCT 39.7 39.7 36.4 34.8* 36.4  MCV 82.5 82.7 82.0 81.7 81.3  PLT 267 167 156 127* 125*   Lab Results  Component Value Date   TSH <0.010 (L) 12/17/2021   Lab Results  Component Value Date   HGBA1C 7.1 (H) 12/15/2021   Lab  Results  Component Value Date   CHOL 110 12/15/2021   HDL 19 (L) 12/15/2021   LDLCALC 46 12/15/2021   TRIG 223 (H) 12/15/2021   CHOLHDL 5.8 12/15/2021    Significant Diagnostic Results in last 30 days:  US THYROID  Result Date: 12/18/2021 CLINICAL DATA:  Hyperthyroid. EXAM: THYROID ULTRASOUND TECHNIQUE: Ultrasound examination of the thyroid gland and adjacent soft tissues was performed. COMPARISON:  None Available. FINDINGS: Parenchymal Echotexture: Markedly heterogeneous, no significant glandular hyperemia Isthmus: 0.3 cm Right lobe: 4.3 x 2.7 x 2.3 cm Left lobe: 2.6 x 1 2 x 0.8 cm ________________________________________________________ Estimated total number of nodules >/= 1 cm: 1 Number of spongiform nodules >/=  2 cm not described below (TR1): 0 Number of mixed cystic and solid nodules >/= 1.5 cm not described below (TR2): 0 _________________________________________________________ Nodule # 1: Location: Right; Inferior Maximum size: 3.0 cm; Other 2 dimensions: 2.9 x 2.1 cm Composition: solid/almost completely solid (2) Echogenicity: hypoechoic (2) Shape: not taller-than-wide (0) Margins: ill-defined (0) Echogenic foci: none (0) ACR TI-RADS total points: 4. ACR TI-RADS risk category: TR4 (4-6 points). ACR TI-RADS recommendations: **Given size (>/= 1.5 cm) and appearance, fine needle aspiration of this moderately suspicious nodule should be considered based on TI-RADS criteria. _________________________________________________________ No cervical lymphadenopathy. IMPRESSION: 1. Normal sized, heterogeneous thyroid gland without significant glandular hyperemia. 2. Solid nodule in the right inferior thyroid (labeled 1, 3.0 cm) meets criteria (TI-RADS category 4) for tissue sampling. Recommend consideration of ultrasound-guided fine-needle aspiration. The above is in keeping with the ACR TI-RADS recommendations - J Am Coll Radiol 2017;14:587-595. Ruthann Cancer, MD Vascular and Interventional Radiology  [facility] Radiology Electronically Signed   By: Ruthann Cancer M.D.   On: 12/18/2021 12:52   ECHOCARDIOGRAM COMPLETE  Result Date: 12/17/2021    ECHOCARDIOGRAM REPORT   Patient Name:   Julie Acosta Date of Exam: 12/16/2021 Medical Rec #:  [id number]       Height:       60.0 in Accession #:    [id number]      Weight:       177.0 lb Date of Birth:  1934/01/04      BSA:          1.772 m Patient Age:    73 years        BP:           110/47 mmHg Patient Gender: F  HR:           95 bpm. Exam Location:  [facility] Procedure: 2D Echo, Cardiac Doppler and Color Doppler Indications:     Elevated Troponin  History:         Patient has prior history of Echocardiogram examinations, most                  recent 07/25/2019. Previous Myocardial Infarction, PAD and                  History of breast cancer; Risk Factors:Diabetes, Dyslipidemia                  and Hypertension.  Sonographer:     Rosalia Hammers Referring Phys:  [id number] RJJOACZY AGBATA Diagnosing Phys: Isaias Cowman MD  Sonographer Comments: Suboptimal apical window and suboptimal subcostal window. Image acquisition challenging due to patient body habitus. IMPRESSIONS  1. Left ventricular ejection fraction, by estimation, is 60 to 65%. The left ventricle has normal function. The left ventricle has no regional wall motion abnormalities. Left ventricular diastolic parameters were normal.  2. Right ventricular systolic function is normal. The right ventricular size is normal.  3. The mitral valve is normal in structure. Mild mitral valve regurgitation. No evidence of mitral stenosis.  4. The aortic valve is normal in structure. Aortic valve regurgitation is mild. Aortic valve sclerosis is present, with no evidence of aortic valve stenosis.  5. The inferior vena cava is normal in size with greater than 50% respiratory variability, suggesting right atrial pressure of 3 mmHg. FINDINGS  Left Ventricle: Left ventricular ejection fraction, by  estimation, is 60 to 65%. The left ventricle has normal function. The left ventricle has no regional wall motion abnormalities. The left ventricular internal cavity size was normal in size. There is  no left ventricular hypertrophy. Left ventricular diastolic parameters were normal. Right Ventricle: The right ventricular size is normal. No increase in right ventricular wall thickness. Right ventricular systolic function is normal. Left Atrium: Left atrial size was normal in size. Right Atrium: Right atrial size was normal in size. Pericardium: There is no evidence of pericardial effusion. Mitral Valve: The mitral valve is normal in structure. Mild mitral valve regurgitation. No evidence of mitral valve stenosis. Tricuspid Valve: The tricuspid valve is normal in structure. Tricuspid valve regurgitation is mild . No evidence of tricuspid stenosis. Aortic Valve: The aortic valve is normal in structure. Aortic valve regurgitation is mild. Aortic valve sclerosis is present, with no evidence of aortic valve stenosis. Aortic valve mean gradient measures 2.0 mmHg. Aortic valve peak gradient measures 4.2  mmHg. Aortic valve area, by VTI measures 2.65 cm. Pulmonic Valve: The pulmonic valve was normal in structure. Pulmonic valve regurgitation is not visualized. No evidence of pulmonic stenosis. Aorta: The aortic root is normal in size and structure. Venous: The inferior vena cava is normal in size with greater than 50% respiratory variability, suggesting right atrial pressure of 3 mmHg. IAS/Shunts: No atrial level shunt detected by color flow Doppler.  LEFT VENTRICLE PLAX 2D LVIDd:         3.73 cm   Diastology LVIDs:         2.53 cm   LV e' medial:    5.55 cm/s LV PW:         1.16 cm   LV E/e' medial:  15.2 LV IVS:        1.21 cm   LV e' lateral:   4.68 cm/s LVOT diam:  1.80 cm   LV E/e' lateral: 18.0 LV SV:         46 LV SV Index:   26 LVOT Area:     2.54 cm  RIGHT VENTRICLE RV Basal diam:  3.22 cm RV S prime:     13.30  cm/s TAPSE (M-mode): 1.9 cm LEFT ATRIUM           Index        RIGHT ATRIUM           Index LA diam:      3.80 cm 2.14 cm/m   RA Area:     14.50 cm LA Vol (A4C): 43.2 ml 24.38 ml/m  RA Volume:   35.30 ml  19.92 ml/m  AORTIC VALVE AV Area (Vmax):    1.99 cm AV Area (Vmean):   2.09 cm AV Area (VTI):     2.65 cm AV Vmax:           102.00 cm/s AV Vmean:          68.200 cm/s AV VTI:            0.172 m AV Peak Grad:      4.2 mmHg AV Mean Grad:      2.0 mmHg LVOT Vmax:         79.60 cm/s LVOT Vmean:        56.000 cm/s LVOT VTI:          0.179 m LVOT/AV VTI ratio: 1.04  AORTA Ao Root diam: 2.70 cm MITRAL VALVE MV Area (PHT): 4.96 cm     SHUNTS MV Decel Time: 153 msec     Systemic VTI:  0.18 m MV E velocity: 84.40 cm/s   Systemic Diam: 1.80 cm MV A velocity: 108.00 cm/s MV E/A ratio:  0.78 Isaias Cowman MD Electronically signed by Isaias Cowman MD Signature Date/Time: 12/17/2021/9:17:52 AM    Final    CT Angio Chest PE W and/or Wo Contrast  Result Date: 12/15/2021 CLINICAL DATA:  Pulmonary embolism suspected, high probability. Abdominal pain, acute, nonlocalized. Hypoxia EXAM: CT ANGIOGRAPHY CHEST CT ABDOMEN AND PELVIS WITH CONTRAST TECHNIQUE: Multidetector CT imaging of the chest was performed using the standard protocol during bolus administration of intravenous contrast. Multiplanar CT image reconstructions and MIPs were obtained to evaluate the vascular anatomy. Multidetector CT imaging of the abdomen and pelvis was performed using the standard protocol during bolus administration of intravenous contrast. RADIATION DOSE REDUCTION: This exam was performed according to the departmental dose-optimization program which includes automated exposure control, adjustment of the mA and/or kV according to patient size and/or use of iterative reconstruction technique. CONTRAST:  74m OMNIPAQUE IOHEXOL 350 MG/ML SOLN COMPARISON:  CT chest with contrast 07/23/2019 scratched at CT of the chest, abdomen and pelvis  with contrast 07/23/2019 FINDINGS: CTA CHEST FINDINGS Cardiovascular: Heart is enlarged. Atherosclerotic calcifications are present at the aortic arch and great vessel origins. New significant stenosis or aneurysm is present. Pulmonary artery opacification is excellent. No focal filling defects are present to suggest pulmonary emboli. Pulmonary arteries are enlarged. Mediastinum/Nodes: No enlarged mediastinal, hilar, or axillary lymph nodes. Thyroid gland, trachea, and esophagus demonstrate no significant findings. Lungs/Pleura: The right main pulmonary artery measures up to 25 mm. Left main pulmonary artery measures up to 26 mm. Right middle lobe and right lower lobe pulmonary nodules on images 40 and 41 of series 6 are stable. Mild dependent airspace disease likely reflects atelectasis. Asymmetric dependent airspace disease is present at the left base. Some  asymmetric airspace disease is noted posteriorly in the right upper lobe is well. No significant pleural effusion or pneumothorax is present. Musculoskeletal: No chest wall abnormality. No acute or significant osseous findings. Review of the MIP images confirms the above findings. CT ABDOMEN and PELVIS FINDINGS Hepatobiliary: No focal liver abnormality is seen. Status post cholecystectomy. No biliary dilatation. Pancreas: Unremarkable. No pancreatic ductal dilatation or surrounding inflammatory changes. Spleen: Normal in size without focal abnormality. Adrenals/Urinary Tract: 2 simple cysts are present in the left kidney. The larger measures 10 mm. 5 mm nonobstructing stone is present in the interpolar region of the right kidney. A 7 mm nonobstructing stone is present near the upper pole of the left kidney. Two 5 mm stones are present in the upper and lower poles of the left kidney respectively. At least 2 other punctate nonobstructing stones are present near the upper pole of the left kidney. No solid mass lesion is present. Ureters are within normal limits  bilaterally. No obstruction is present. The urinary bladder is within normal limits. Stomach/Bowel: The stomach and duodenum are within normal limits. Small bowel is unremarkable. Terminal ileum is normal. The appendix is visualized and within normal limits. The ascending and transverse colon are within normal limits. Diverticular changes are present the distal descending and sigmoid colon. No inflammatory changes are present to suggest diverticulitis. Vascular/Lymphatic: Atherosclerotic calcifications are present in the aorta and branch vessels. No aneurysm is present. Reproductive: 21 mm left adnexal cyst is stable. Uterus and adnexa are otherwise unremarkable. Other: Choose 1 Musculoskeletal: Left hemipelvectomy stable. Degenerative changes are noted at the right SI joint. No focal osseous lesions are present. Degenerative changes are present in the lower lumbar spine. Review of the MIP images confirms the above findings. IMPRESSION: 1. No pulmonary embolus. 2. Asymmetric airspace disease at the left base and posteriorly in the right upper lobe concerning for pneumonia. 3. Cardiomegaly with enlargement of the main pulmonary arteries suggesting pulmonary arterial hypertension. 4. Stable right middle lobe and right lower lobe pulmonary nodules. 5. Bilateral nonobstructing renal stones. 6. Descending and sigmoid diverticulosis without evidence for diverticulitis. 7. Stable 21 mm left adnexal cyst. No follow-up imaging recommended. Note: This recommendation does not apply to premenarchal patients and to those with increased risk (genetic, family history, elevated tumor markers or other high-risk factors) of ovarian cancer. Reference: JACR 2020 Feb; 17(2):248-254 8. Degenerative changes in the lower lumbar spine. 9. Aortic Atherosclerosis (ICD10-I70.0). Electronically Signed   By: San Morelle M.D.   On: 12/15/2021 12:17   CT ABDOMEN PELVIS W CONTRAST  Result Date: 12/15/2021 CLINICAL DATA:  Pulmonary  embolism suspected, high probability. Abdominal pain, acute, nonlocalized. Hypoxia EXAM: CT ANGIOGRAPHY CHEST CT ABDOMEN AND PELVIS WITH CONTRAST TECHNIQUE: Multidetector CT imaging of the chest was performed using the standard protocol during bolus administration of intravenous contrast. Multiplanar CT image reconstructions and MIPs were obtained to evaluate the vascular anatomy. Multidetector CT imaging of the abdomen and pelvis was performed using the standard protocol during bolus administration of intravenous contrast. RADIATION DOSE REDUCTION: This exam was performed according to the departmental dose-optimization program which includes automated exposure control, adjustment of the mA and/or kV according to patient size and/or use of iterative reconstruction technique. CONTRAST:  51m OMNIPAQUE IOHEXOL 350 MG/ML SOLN COMPARISON:  CT chest with contrast 07/23/2019 scratched at CT of the chest, abdomen and pelvis with contrast 07/23/2019 FINDINGS: CTA CHEST FINDINGS Cardiovascular: Heart is enlarged. Atherosclerotic calcifications are present at the aortic arch and great vessel origins. New  significant stenosis or aneurysm is present. Pulmonary artery opacification is excellent. No focal filling defects are present to suggest pulmonary emboli. Pulmonary arteries are enlarged. Mediastinum/Nodes: No enlarged mediastinal, hilar, or axillary lymph nodes. Thyroid gland, trachea, and esophagus demonstrate no significant findings. Lungs/Pleura: The right main pulmonary artery measures up to 25 mm. Left main pulmonary artery measures up to 26 mm. Right middle lobe and right lower lobe pulmonary nodules on images 40 and 41 of series 6 are stable. Mild dependent airspace disease likely reflects atelectasis. Asymmetric dependent airspace disease is present at the left base. Some asymmetric airspace disease is noted posteriorly in the right upper lobe is well. No significant pleural effusion or pneumothorax is present.  Musculoskeletal: No chest wall abnormality. No acute or significant osseous findings. Review of the MIP images confirms the above findings. CT ABDOMEN and PELVIS FINDINGS Hepatobiliary: No focal liver abnormality is seen. Status post cholecystectomy. No biliary dilatation. Pancreas: Unremarkable. No pancreatic ductal dilatation or surrounding inflammatory changes. Spleen: Normal in size without focal abnormality. Adrenals/Urinary Tract: 2 simple cysts are present in the left kidney. The larger measures 10 mm. 5 mm nonobstructing stone is present in the interpolar region of the right kidney. A 7 mm nonobstructing stone is present near the upper pole of the left kidney. Two 5 mm stones are present in the upper and lower poles of the left kidney respectively. At least 2 other punctate nonobstructing stones are present near the upper pole of the left kidney. No solid mass lesion is present. Ureters are within normal limits bilaterally. No obstruction is present. The urinary bladder is within normal limits. Stomach/Bowel: The stomach and duodenum are within normal limits. Small bowel is unremarkable. Terminal ileum is normal. The appendix is visualized and within normal limits. The ascending and transverse colon are within normal limits. Diverticular changes are present the distal descending and sigmoid colon. No inflammatory changes are present to suggest diverticulitis. Vascular/Lymphatic: Atherosclerotic calcifications are present in the aorta and branch vessels. No aneurysm is present. Reproductive: 21 mm left adnexal cyst is stable. Uterus and adnexa are otherwise unremarkable. Other: Choose 1 Musculoskeletal: Left hemipelvectomy stable. Degenerative changes are noted at the right SI joint. No focal osseous lesions are present. Degenerative changes are present in the lower lumbar spine. Review of the MIP images confirms the above findings. IMPRESSION: 1. No pulmonary embolus. 2. Asymmetric airspace disease at the  left base and posteriorly in the right upper lobe concerning for pneumonia. 3. Cardiomegaly with enlargement of the main pulmonary arteries suggesting pulmonary arterial hypertension. 4. Stable right middle lobe and right lower lobe pulmonary nodules. 5. Bilateral nonobstructing renal stones. 6. Descending and sigmoid diverticulosis without evidence for diverticulitis. 7. Stable 21 mm left adnexal cyst. No follow-up imaging recommended. Note: This recommendation does not apply to premenarchal patients and to those with increased risk (genetic, family history, elevated tumor markers or other high-risk factors) of ovarian cancer. Reference: JACR 2020 Feb; 17(2):248-254 8. Degenerative changes in the lower lumbar spine. 9. Aortic Atherosclerosis (ICD10-I70.0). Electronically Signed   By: San Morelle M.D.   On: 12/15/2021 12:17   DG Chest Port 1 View  Result Date: 12/15/2021 CLINICAL DATA:  88 year old female with possible sepsis. EXAM: PORTABLE CHEST 1 VIEW COMPARISON:  Chest x-ray 10/02/2017. FINDINGS: Lung volumes are low. Blunting of the left costophrenic sulcus. Opacity at the left base which may reflect atelectasis and/or consolidation. Diffuse interstitial prominence and peribronchial cuffing, concerning for an acute bronchitis. No pneumothorax. Heart size is upper  limits of normal. The patient is rotated to the left on today's exam, resulting in distortion of the mediastinal contours and reduced diagnostic sensitivity and specificity for mediastinal pathology. IMPRESSION: 1. Findings are concerning for severe acute bronchitis, likely with developing left lower lobe bronchopneumonia and small parapneumonic pleural effusion. Followup PA and lateral chest X-ray is recommended in 3-4 weeks following trial of antibiotic therapy to ensure resolution and exclude underlying malignancy. Electronically Signed   By: Vinnie Langton M.D.   On: 12/15/2021 10:29    Assessment/Plan 1. Persistent depressive  disorder Patient has had issues with depression for 40 years. PHQ2 negative today during evaluation. Discussed concerns for overall mood. Patient feels that the majority of her issues come form poor sleep. She has slept with loud noise for the last 40+ years. She hasn't been able to have that environment here due to shared living quarters. Declined changing medications for depression at this itme. Discussed concerns with Paxil. After discussion with daughter will follow up in 1 month and if no improvement in sleep hygiene. Plan to start melatonin 3 mg nightly. Will follow up in 1 month. Discussed trazodone vs change in medication from Paxil to a different medication. Will continue medication management discussion.   2. Primary hypertension BP elevated today, however, on review--- 4/5 blood pressures are at goal <140/80. Will continue to monitor for persistently elevated blood pressure and need for medication adjustments.   3. Splenic infarct Hx of hospitalization with splenic infarct and had trouble with discontinuing Paxil at that time. Will make sure she has 1:1 transition of medications in the future.   4. History of delirium Patient had delirium in at least two hospitalizations. Concern that she is still recovering from most recent episode 8/14. Discussed concern that it can take months for this to improve and she may be at a new mental baseline. Patient was previously on tizanidine. Discontinue this medication given concern for persistent symptoms-- she is not taking regularly.   Greater than 45 minutes total was spent in chart review, face-to-face time, and education of patient and family.    Family/ staff Communication: daughter and nursing   Labs/tests ordered:  TSH ordered for 2 weeks from now.

## 2022-01-09 ENCOUNTER — Encounter: Payer: Self-pay | Admitting: Student

## 2022-01-23 ENCOUNTER — Ambulatory Visit (INDEPENDENT_AMBULATORY_CARE_PROVIDER_SITE_OTHER): Payer: Medicare Other

## 2022-01-23 ENCOUNTER — Ambulatory Visit (INDEPENDENT_AMBULATORY_CARE_PROVIDER_SITE_OTHER): Payer: Medicare Other | Admitting: Nurse Practitioner

## 2022-01-23 VITALS — BP 131/61 | HR 59 | Resp 14 | Ht 63.0 in | Wt 173.0 lb

## 2022-01-23 DIAGNOSIS — I890 Lymphedema, not elsewhere classified: Secondary | ICD-10-CM | POA: Diagnosis not present

## 2022-01-23 DIAGNOSIS — I10 Essential (primary) hypertension: Secondary | ICD-10-CM

## 2022-01-23 DIAGNOSIS — I739 Peripheral vascular disease, unspecified: Secondary | ICD-10-CM

## 2022-01-23 DIAGNOSIS — E119 Type 2 diabetes mellitus without complications: Secondary | ICD-10-CM | POA: Diagnosis not present

## 2022-01-23 NOTE — Progress Notes (Signed)
Subjective:    Patient ID: Julie Acosta, female    DOB: 1933/11/10, 88 y.o.   MRN: [id number] Chief Complaint  Patient presents with   Follow-up    1 year and ABI    The patient returns to the office for followup and review of the noninvasive studies.  The patient also has significant lymphedema.  This is also been under good control.  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  There have been no interval changes in lower extremity symptoms. No interval shortening of the patient's claudication distance or development of rest pain symptoms. No new ulcers or wounds have occurred since the last visit.  There have been no significant changes to the patient's overall health care.  The patient denies amaurosis fugax or recent TIA symptoms. There are no documented recent neurological changes noted. There is no history of DVT, PE or superficial thrombophlebitis. The patient denies recent episodes of angina or shortness of breath.   ABI Rt=1.17 and Lt=n/a  (previous ABI's Rt=1.12 and Lt=n/a) Duplex ultrasound of the the patient has triphasic tibial artery waveforms with good toe waveforms in the right lower extremity    Review of Systems  Cardiovascular:  Negative for leg swelling.  Musculoskeletal:  Positive for arthralgias and gait problem.  All other systems reviewed and are negative.      Objective:   Physical Exam Vitals reviewed.  HENT:     Head: Normocephalic.  Cardiovascular:     Rate and Rhythm: Normal rate.     Pulses: Normal pulses.  Pulmonary:     Effort: Pulmonary effort is normal.  Musculoskeletal:     Right lower leg: Edema present.  Skin:    General: Skin is warm and dry.  Neurological:     Mental Status: She is alert and oriented to person, place, and time.  Psychiatric:        Mood and Affect: Mood normal.        Behavior: Behavior normal.        Thought Content: Thought content normal.        Judgment:  Judgment normal.     BP 131/61 (BP Location: Left Arm)   Pulse (!) 59   Resp 14   Ht _0  (1.6 m)   Wt 173 lb (78.5 kg)   BMI 30.65 kg/m   Past Medical History:  Diagnosis Date   Anxiety    Benign breast lumps    Blood clotting disorder (HCC)    Bone cancer (HCC)    head of femur, left leg ... amputation at age 51   Breast cancer of upper-outer quadrant of left female breast ([place]) 07/27/2017   11 mm invasive mammary carcinoma, ER 90%, PR 51-90%, negative margins.  Oncotype recurrence score: 1.  DCIS, margin less than 0.5 mm.  Negative sentinel node.  Accelerated partial breast radiation.   Cervicalgia    Chronic kidney disease    kidney stones   Colon cancer [facility])    patient unaware of this   Complication of anesthesia    mood alteration / not sure if d/t pain medicine or anesthesia as she passed out    Cough    NASAL DRIP / SNEEZING / SORE THROAT MOSTLY CONSTANT   Depression    Diabetes ([place])    Diverticulitis    Dizzy    GERD (gastroesophageal reflux disease)    H/O blood clots    arm and  leg   HBP (high blood pressure)    Hematuria    gross   Hemorrhoids    HLD (hyperlipidemia)    Muscle pain    Osteoarthritis    Osteoporosis    Panic disorder    Personal history of radiation therapy    PND (post-nasal drip)    CHRONIC WITH SORE THROAT AND SNEEZING   Reflux    Skin cancer    nose   Swelling    Tremor    Tremors of nervous system     Social History   Socioeconomic History   Marital status: Divorced    Spouse name: Not on file   Number of children: Not on file   Years of education: Not on file   Highest education level: Not on file  Occupational History   Occupation: Pharmacist, hospital    Comment: retired  Tobacco Use   Smoking status: Never   Smokeless tobacco: Never  Vaping Use   Vaping Use: Never used  Substance and Sexual Activity   Alcohol use: No   Drug use: No   Sexual activity: Not Currently  Other Topics Concern   Not on file  Social  History Narrative   [facility] lives [facility]   Social Determinants of Health   Financial Resource Strain: Not on file  Food Insecurity: Not on file  Transportation Needs: Not on file  Physical Activity: Not on file  Stress: Not on file  Social Connections: Not on file  Intimate Partner Violence: Not on file    Past Surgical History:  Procedure Laterality Date   APPENDECTOMY  1962   BREAST BIOPSY Right 2006?   benign   BREAST BIOPSY Left 2019   invasive mammary carcinoma   BREAST CYST EXCISION Left 07/27/2017   Procedure: SKIN CYST EXCISED;  Surgeon: Robert Bellow, MD;  Location: [facility] ORS;  Service: General;  Laterality: Left;   BREAST LUMPECTOMY Left 07/2017   invasive mammary, DCIS  mammosite   BREAST SURGERY Right 2019   CARPAL TUNNEL RELEASE Right    CATARACT EXTRACTION W/PHACO Left 11/11/2016   Procedure: CATARACT EXTRACTION PHACO AND INTRAOCULAR LENS PLACEMENT ([place]);  Surgeon: Birder Robson, MD;  Location: [facility] ORS;  Service: Ophthalmology;  Laterality: Left;  [place] 01:07.9 AP% 19.2 CDE 13.02 Fluid pack lot # [id number]   CATARACT EXTRACTION W/PHACO Right 12/09/2016   Procedure: CATARACT EXTRACTION PHACO AND INTRAOCULAR LENS PLACEMENT (I[facility]);  Surgeon: Birder Robson, MD;  Location: [facility] ORS;  Service: Ophthalmology;  Laterality: Right;  [place] 00:49 AP% 21.2 CDE 10.39 Fluid pack lot # [id number]   CHOLECYSTECTOMY     COLONOSCOPY WITH PROPOFOL N/A 11/22/2014   Procedure: COLONOSCOPY WITH PROPOFOL;  Surgeon: Manya Silvas, MD;  Location: [facility] ENDOSCOPY;  Service: Endoscopy;  Laterality: N/A;   CYSTOSCOPY WITH STENT PLACEMENT Bilateral 11/27/2018   Procedure: CYSTOSCOPY WITH STENT PLACEMENT;  Surgeon: Festus Aloe, MD;  Location: [facility] ORS;  Service: Urology;  Laterality: Bilateral;   CYSTOSCOPY/URETEROSCOPY/HOLMIUM LASER/STENT PLACEMENT Bilateral 12/17/2018   Procedure: CYSTOSCOPY/URETEROSCOPY/HOLMIUM LASER/STENT Exchange;  Surgeon: Billey Co, MD;  Location: [facility] ORS;  Service: Urology;  Laterality: Bilateral;   ESOPHAGOGASTRODUODENOSCOPY  11/22/2014   Procedure: ESOPHAGOGASTRODUODENOSCOPY (EGD);  Surgeon: Manya Silvas, MD;  Location: [facility] ENDOSCOPY;  Service: Endoscopy;;   ESOPHAGOGASTRODUODENOSCOPY (EGD) WITH PROPOFOL N/A 03/05/2017   Procedure: ESOPHAGOGASTRODUODENOSCOPY (EGD) WITH PROPOFOL;  Surgeon: Lin Landsman, MD;  Location: [facility] ENDOSCOPY;  Service: Gastroenterology;  Laterality: N/A;   EXTRACORPOREAL SHOCK WAVE LITHOTRIPSY  Right 03/04/2018   Procedure: EXTRACORPOREAL SHOCK WAVE LITHOTRIPSY (ESWL);  Surgeon: Billey Co, MD;  Location: [facility] ORS;  Service: Urology;  Laterality: Right;   EYE SURGERY Bilateral 2012   cataract extraction with iol   GALLBLADDER SURGERY  1968   hemi pelvectomy     left side at age 89   HEMIPELVIC Left [place] Left    AMPUTATION d/t cancer at 88 years old   MASTECTOMY, PARTIAL Left 07/27/2017   Procedure: MASTECTOMY PARTIAL;  Surgeon: Robert Bellow, MD;  Location: [facility] ORS;  Service: General;  Laterality: Left;   MOUTH SURGERY  2019   teeth pulled with a bridge insertion.   fell out 12/16/18   OVARY SURGERY Right    cyst removed   SAVORY DILATION  11/22/2014   Procedure: SAVORY DILATION;  Surgeon: Manya Silvas, MD;  Location[facility];  Service: Endoscopy;;   SENTINEL NODE BIOPSY Left 07/27/2017   Procedure: SENTINEL NODE BIOPSY;  Surgeon: Robert Bellow, MD;  Location: [facility] ORS;  Service: General;  Laterality: Left;   SKIN CANCER EXCISION     nose and arm and face   TEE WITHOUT CARDIOVERSION N/A 07/25/2019   Procedure: TRANSESOPHAGEAL ECHOCARDIOGRAM (TEE);  Surgeon: Corey Skains, MD;  Location: [facility] ORS;  Service: Cardiovascular;  Laterality: N/A;   TONSILLECTOMY      Family History  Problem Relation Age of Onset   Breast cancer Paternal Aunt    Ovarian cancer Sister    Prostate cancer Father    Stroke Mother    Kidney cancer Neg  Hx    Bladder Cancer Neg Hx     Allergies  Allergen Reactions   Ciprofloxacin Hives   Codeine Other (See Comments)    HALLUCINATIONS   Tape     Rash and skin irritation/ paper tape and tegaderm OK   Ciprocinonide [Fluocinolone] Other (See Comments)    unknown   Amoxicillin Other (See Comments)    Upset stomach Has patient had a PCN reaction causing immediate rash, facial/tongue/throat swelling, SOB or lightheadedness with hypotension: No Has patient had a PCN reaction causing severe rash involving mucus membranes or skin necrosis: No Has patient had a PCN reaction that required hospitalization: No Has patient had a PCN reaction occurring within the last 10 years: Yes If all of the above answers are "NO", then may proceed with Cephalosporin use.;   Cefuroxime Other (See Comments)    OK INTRACAMERALLY PER DR WLP, upset stomach   Latex Rash    Rast test NEGATIVE   Lipitor [Atorvastatin] Other (See Comments)    unknown       Latest Ref Rng & Units 12/17/2021    5:33 AM 12/16/2021    5:29 AM 12/15/2021    3:45 PM  CBC  WBC 4.0 - 10.5 K/uL 8.6  12.3  15.3   Hemoglobin 12.0 - 15.0 g/dL 10.9  10.5  10.9   Hematocrit 36.0 - 46.0 % 36.4  34.8  36.4   Platelets 150 - 400 K/uL 125  127  156       CMP     Component Value Date/Time   NA 139 12/19/2021 0621   NA 139 07/01/2013 1721   K 3.6 12/19/2021 0621   K 4.6 07/01/2013 1721   CL 112 (H) 12/19/2021 0621   CL 108 (H) 07/01/2013 1721   CO2 20 (L) 12/19/2021 0621   CO2 23 07/01/2013 1721   GLUCOSE 243 (H) 12/19/2021 6546  GLUCOSE 150 (H) 07/01/2013 1721   BUN 21 12/19/2021 0621   BUN 23 01/20/2017 1134   BUN 21 (H) 07/01/2013 1721   CREATININE 0.53 12/19/2021 0621   CREATININE 0.95 07/01/2013 1721   CALCIUM 9.4 12/19/2021 0621   CALCIUM 11.2 (H) 07/01/2013 1721   PROT 6.0 (L) 12/17/2021 0533   PROT 7.4 07/01/2013 1721   ALBUMIN 2.7 (L) 12/17/2021 0533   ALBUMIN 3.6 07/01/2013 1721   AST 25 12/17/2021 0533   AST 16  07/01/2013 1721   ALT 26 12/17/2021 0533   ALT 19 07/01/2013 1721   ALKPHOS 45 12/17/2021 0533   ALKPHOS 104 07/01/2013 1721   BILITOT 0.5 12/17/2021 0533   BILITOT 0.2 07/01/2013 1721   GFRNONAA >60 12/19/2021 0621   GFRNONAA 58 (L) 07/01/2013 1721   GFRAA >60 01/24/2020 1023   GFRAA >60 07/01/2013 1721     No results found.     Assessment & Plan:   1. PAD (peripheral artery disease) (HCC)  Recommend:  The patient has evidence of atherosclerosis of the lower extremities with claudication.  The patient does not voice lifestyle limiting changes at this point in time.  Noninvasive studies do not suggest clinically significant change.  No invasive studies, angiography or surgery at this time The patient should continue walking and begin a more formal exercise program.  The patient should continue antiplatelet therapy and aggressive treatment of the lipid abnormalities  No changes in the patient's medications at this time  Continued surveillance is indicated as atherosclerosis is likely to progress with time.    The patient will continue follow up with noninvasive studies as ordered.    2. Primary hypertension Continue antihypertensive medications as already ordered, these medications have been reviewed and there are no changes at this time.   3. Controlled type 2 diabetes mellitus without complication, without long-term current use of insulin ([place]) Continue hypoglycemic medications as already ordered, these medications have been reviewed and there are no changes at this time.  Hgb A1C to be monitored as already arranged by primary service    4. Lymphedema The patient has excellent control of her swelling.  Advised to continue with use of medical grade compression wraps as well as elevation daily.  Current Outpatient Medications on File Prior to Visit  Medication Sig Dispense Refill   acetaminophen (TYLENOL) 500 MG tablet Take 500 mg by mouth every 6 (six) hours as  needed.     acetaminophen (TYLENOL) 650 MG CR tablet Take 650 mg by mouth every 8 (eight) hours as needed for pain.     Biotin 10 MG TABS Take 10 mg by mouth at bedtime.     Cholecalciferol 25 MCG (1000 UT) tablet Take 1,000 Units by mouth daily.     Dentifrices (BIOTENE DRY MOUTH DT) by Transmucosal route.     diclofenac Sodium (VOLTAREN) 1 % GEL Apply 4 g topically 4 (four) times daily.     gabapentin (NEURONTIN) 100 MG capsule Take 100 mg by mouth 3 (three) times daily.     GLIPIZIDE XL 2.5 MG 24 hr tablet Take 2.5 mg by mouth daily.      letrozole (FEMARA) 2.5 MG tablet TAKE ONE TABLET BY MOUTH EVERY DAY 90 tablet 3   loratadine (CLARITIN) 10 MG tablet Take 10 mg by mouth daily.     metFORMIN (GLUCOPHAGE) 500 MG tablet Take 500 mg by mouth daily with breakfast.     metoprolol tartrate (LOPRESSOR) 25 MG tablet Take 1 tablet (25  mg total) by mouth 3 (three) times daily. 60 tablet 1   Multiple Vitamin (MULTIVITAMIN WITH MINERALS) TABS tablet Take 1 tablet by mouth daily.     nystatin (MYCOSTATIN/NYSTOP) powder Apply 1 application  topically. Apply to right groin daily     omeprazole (PRILOSEC) 40 MG capsule Take 40 mg by mouth daily.      PARoxetine (PAXIL) 30 MG tablet Take 30 mg by mouth daily.     Polyethyl Glycol-Propyl Glycol 0.4-0.3 % SOLN Apply to eye 2 (two) times daily as needed.     PRADAXA 150 MG CAPS capsule TAKE 1 CAPSULE BY MOUTH TWICE DAILY START TREATMENT 10 HRS LAST DOSE OF LOVENOX (Patient taking differently: Take 150 mg by mouth 2 (two) times daily.) 60 capsule 2   tiZANidine (ZANAFLEX) 2 MG tablet Take 2 mg by mouth every 12 (twelve) hours as needed for muscle spasms.     Blood Glucose Monitoring Suppl (FIFTY50 GLUCOSE METER 2.0) w/Device KIT See admin instructions. (Patient not taking: Reported on 01/22/2021)     No current facility-administered medications on file prior to visit.    There are no Patient Instructions on file for this visit. No follow-ups on  file.   Kris Hartmann, NP

## 2022-01-24 ENCOUNTER — Encounter (INDEPENDENT_AMBULATORY_CARE_PROVIDER_SITE_OTHER): Payer: Self-pay | Admitting: Nurse Practitioner

## 2022-01-24 ENCOUNTER — Inpatient Hospital Stay: Payer: Medicare Other

## 2022-01-24 ENCOUNTER — Inpatient Hospital Stay: Payer: Medicare Other | Attending: Nurse Practitioner

## 2022-01-24 ENCOUNTER — Inpatient Hospital Stay (HOSPITAL_BASED_OUTPATIENT_CLINIC_OR_DEPARTMENT_OTHER): Payer: Medicare Other | Admitting: Nurse Practitioner

## 2022-01-24 ENCOUNTER — Encounter: Payer: Self-pay | Admitting: Nurse Practitioner

## 2022-01-24 VITALS — BP 129/55 | HR 51 | Temp 97.9°F | Resp 20 | Wt 173.0 lb

## 2022-01-24 DIAGNOSIS — Z8041 Family history of malignant neoplasm of ovary: Secondary | ICD-10-CM | POA: Insufficient documentation

## 2022-01-24 DIAGNOSIS — M810 Age-related osteoporosis without current pathological fracture: Secondary | ICD-10-CM | POA: Diagnosis not present

## 2022-01-24 DIAGNOSIS — N189 Chronic kidney disease, unspecified: Secondary | ICD-10-CM | POA: Insufficient documentation

## 2022-01-24 DIAGNOSIS — Z8042 Family history of malignant neoplasm of prostate: Secondary | ICD-10-CM | POA: Insufficient documentation

## 2022-01-24 DIAGNOSIS — Z803 Family history of malignant neoplasm of breast: Secondary | ICD-10-CM | POA: Insufficient documentation

## 2022-01-24 DIAGNOSIS — E1122 Type 2 diabetes mellitus with diabetic chronic kidney disease: Secondary | ICD-10-CM | POA: Diagnosis not present

## 2022-01-24 DIAGNOSIS — D6851 Activated protein C resistance: Secondary | ICD-10-CM | POA: Diagnosis not present

## 2022-01-24 DIAGNOSIS — C50412 Malignant neoplasm of upper-outer quadrant of left female breast: Secondary | ICD-10-CM | POA: Insufficient documentation

## 2022-01-24 DIAGNOSIS — Z170 Estrogen receptor positive status [ER+]: Secondary | ICD-10-CM | POA: Diagnosis not present

## 2022-01-24 DIAGNOSIS — D689 Coagulation defect, unspecified: Secondary | ICD-10-CM

## 2022-01-24 LAB — BASIC METABOLIC PANEL
Anion gap: 5 (ref 5–15)
BUN: 22 mg/dL (ref 8–23)
CO2: 31 mmol/L (ref 22–32)
Calcium: 11.8 mg/dL — ABNORMAL HIGH (ref 8.9–10.3)
Chloride: 107 mmol/L (ref 98–111)
Creatinine, Ser: 0.69 mg/dL (ref 0.44–1.00)
GFR, Estimated: >60 mL/min (ref >60)
Glucose, Bld: 198 mg/dL — ABNORMAL HIGH (ref 70–99)
Potassium: 4.7 mmol/L (ref 3.5–5.1)
Sodium: 143 mmol/L (ref 135–145)

## 2022-01-24 LAB — CBC WITH DIFFERENTIAL/PLATELET
Abs Immature Granulocytes: 0.03 10*3/uL (ref 0.00–0.07)
Basophils Absolute: 0.1 10*3/uL (ref 0.0–0.1)
Basophils Relative: 1 %
Eosinophils Absolute: 0.2 10*3/uL (ref 0.0–0.5)
Eosinophils Relative: 2 %
HCT: 41 % (ref 36.0–46.0)
Hemoglobin: 12.2 g/dL (ref 12.0–15.0)
Immature Granulocytes: 0 %
Lymphocytes Relative: 25 %
Lymphs Abs: 2.3 10*3/uL (ref 0.7–4.0)
MCH: 25.6 pg — ABNORMAL LOW (ref 26.0–34.0)
MCHC: 29.8 g/dL — ABNORMAL LOW (ref 30.0–36.0)
MCV: 86 fL (ref 80.0–100.0)
Monocytes Absolute: 0.7 10*3/uL (ref 0.1–1.0)
Monocytes Relative: 7 %
Neutro Abs: 5.9 10*3/uL (ref 1.7–7.7)
Neutrophils Relative %: 65 %
Platelets: 218 10*3/uL (ref 150–400)
RBC: 4.77 MIL/uL (ref 3.87–5.11)
RDW: 17.7 % — ABNORMAL HIGH (ref 11.5–15.5)
WBC: 9.3 10*3/uL (ref 4.0–10.5)
nRBC: 0 % (ref 0.0–0.2)

## 2022-01-24 MED ORDER — DENOSUMAB 60 MG/ML ~~LOC~~ SOSY
60.0000 mg | PREFILLED_SYRINGE | Freq: Once | SUBCUTANEOUS | Status: AC
  Administered 2022-01-24: 60 mg via SUBCUTANEOUS
  Filled 2022-01-24: qty 1

## 2022-01-24 NOTE — Progress Notes (Signed)
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 Oncotype DX score 1. Homozygous factor V Leiden.  INTERVAL HISTORY: Patient is 88 year old female with above history of breast cancer, osteoporosis, and factor V Leiden who returns to clinic for 52-month follow-up and continuation of Prolia.  In the interim, she was admitted to the hospital on 12/15/2021 through 12/19/2021 for HCAP.  She continues to reside at [facility] with assisted living.  Denies any lethargy, confusion, muscle weakness, palpitations. No interval falls or fractures.   REVIEW OF SYSTEMS:   Review of Systems  Constitutional:  Negative for chills, fever, malaise/fatigue and weight loss.  HENT:  Negative for hearing loss, nosebleeds, sore throat and tinnitus.   Eyes:  Negative for blurred vision and double vision.  Respiratory:  Negative for cough, hemoptysis, shortness of breath and wheezing.   Cardiovascular:  Negative for chest pain, palpitations and leg swelling.  Gastrointestinal:  Negative for abdominal pain, blood in stool, constipation, diarrhea, melena, nausea and vomiting.  Genitourinary:  Negative for dysuria and urgency.  Musculoskeletal:  Negative for back pain, falls, joint pain and myalgias.  Skin:  Negative for itching and rash.  Neurological:  Negative for dizziness, tingling, sensory change, loss of consciousness, weakness and headaches.  Endo/Heme/Allergies:  Negative for environmental allergies. Does not bruise/bleed easily.  Psychiatric/Behavioral:  Negative for depression. The patient is not  nervous/anxious and does not have insomnia.   As per HPI. Otherwise, a complete review of systems is negative.   PAST MEDICAL HISTORY:  Past Medical History:  Diagnosis Date   Anxiety    Benign breast lumps    Blood clotting disorder (HCC)    Bone cancer (HCC)    head of femur, left leg ... amputation at age 40   Breast cancer of upper-outer quadrant of left female breast (HCC) 07/27/2017   11 mm invasive mammary carcinoma, ER 90%, PR 51-90%, negative margins.  Oncotype recurrence score: 1.  DCIS, margin less than 0.5 mm.  Negative sentinel node.  Accelerated partial breast radiation.   Cervicalgia    Chronic kidney disease    kidney stones   Colon cancer [facility])    patient unaware of this   Complication of anesthesia    mood alteration / not sure if d/t pain medicine or anesthesia as she passed out    Cough    NASAL DRIP / SNEEZING / SORE THROAT MOSTLY CONSTANT   Depression    Diabetes (HCC)    Diverticulitis    Dizzy    GERD (gastroesophageal reflux disease)    H/O blood clots    arm and leg   HBP (high blood pressure)    Hematuria    gross   Hemorrhoids    HLD (hyperlipidemia)    Muscle pain    Osteoarthritis    Osteoporosis    Panic disorder    Personal history of radiation therapy    PND (post-nasal drip)    CHRONIC WITH SORE THROAT AND SNEEZING   Reflux    Skin cancer    nose   Swelling    Tremor    Tremors of nervous system  PAST SURGICAL HISTORY: Past Surgical History:  Procedure Laterality Date   APPENDECTOMY  1962   BREAST BIOPSY Right 2006?   benign   BREAST BIOPSY Left 2019   invasive mammary carcinoma   BREAST CYST EXCISION Left 07/27/2017   Procedure: SKIN CYST EXCISED;  Surgeon: Robert Bellow, MD;  Location: [facility] ORS;  Service: General;  Laterality: Left;   BREAST LUMPECTOMY Left 07/2017   invasive mammary, DCIS  mammosite   BREAST SURGERY Right 2019   CARPAL TUNNEL RELEASE Right    CATARACT EXTRACTION W/PHACO Left 11/11/2016    Procedure: CATARACT EXTRACTION PHACO AND INTRAOCULAR LENS PLACEMENT ([place]);  Surgeon: Birder Robson, MD;  Location: [facility] ORS;  Service: Ophthalmology;  Laterality: Left;  [place] 01:07.9 AP% 19.2 CDE 13.02 Fluid pack lot # [id number]   CATARACT EXTRACTION W/PHACO Right 12/09/2016   Procedure: CATARACT EXTRACTION PHACO AND INTRAOCULAR LENS PLACEMENT (IOC);  Surgeon: Birder Robson, MD;  Location: [facility] ORS;  Service: Ophthalmology;  Laterality: Right;  [place] 00:49 AP% 21.2 CDE 10.39 Fluid pack lot # [id number]   CHOLECYSTECTOMY     COLONOSCOPY WITH PROPOFOL N/A 11/22/2014   Procedure: COLONOSCOPY WITH PROPOFOL;  Surgeon: Manya Silvas, MD;  Location: [facility] ENDOSCOPY;  Service: Endoscopy;  Laterality: N/A;   CYSTOSCOPY WITH STENT PLACEMENT Bilateral 11/27/2018   Procedure: CYSTOSCOPY WITH STENT PLACEMENT;  Surgeon: Festus Aloe, MD;  Location: [facility] ORS;  Service: Urology;  Laterality: Bilateral;   CYSTOSCOPY/URETEROSCOPY/HOLMIUM LASER/STENT PLACEMENT Bilateral 12/17/2018   Procedure: CYSTOSCOPY/URETEROSCOPY/HOLMIUM LASER/STENT Exchange;  Surgeon: Billey Co, MD;  Location: [facility] ORS;  Service: Urology;  Laterality: Bilateral;   ESOPHAGOGASTRODUODENOSCOPY  11/22/2014   Procedure: ESOPHAGOGASTRODUODENOSCOPY (EGD);  Surgeon: Manya Silvas, MD;  Location: [facility] ENDOSCOPY;  Service: Endoscopy;;   ESOPHAGOGASTRODUODENOSCOPY (EGD) WITH PROPOFOL N/A 03/05/2017   Procedure: ESOPHAGOGASTRODUODENOSCOPY (EGD) WITH PROPOFOL;  Surgeon: Lin Landsman, MD;  Location: [facility] ENDOSCOPY;  Service: Gastroenterology;  Laterality: N/A;   EXTRACORPOREAL SHOCK WAVE LITHOTRIPSY Right 03/04/2018   Procedure: EXTRACORPOREAL SHOCK WAVE LITHOTRIPSY (ESWL);  Surgeon: Billey Co, MD;  Location: [facility] ORS;  Service: Urology;  Laterality: Right;   EYE SURGERY Bilateral 2012   cataract extraction with iol   GALLBLADDER SURGERY  1968   hemi pelvectomy     left side at age 89   HEMIPELVIC Left [place] Left     AMPUTATION d/t cancer at 88 years old   MASTECTOMY, PARTIAL Left 07/27/2017   Procedure: MASTECTOMY PARTIAL;  Surgeon: Robert Bellow, MD;  Location: [facility] ORS;  Service: General;  Laterality: Left;   MOUTH SURGERY  2019   teeth pulled with a bridge insertion.   fell out 12/16/18   OVARY SURGERY Right    cyst removed   SAVORY DILATION  11/22/2014   Procedure: SAVORY DILATION;  Surgeon: Manya Silvas, MD;  Location: [facility] ENDOSCOPY;  Service: Endoscopy;;   SENTINEL NODE BIOPSY Left 07/27/2017   Procedure: SENTINEL NODE BIOPSY;  Surgeon: Robert Bellow, MD;  Location: [facility] ORS;  Service: General;  Laterality: Left;   SKIN CANCER EXCISION     nose and arm and face   TEE WITHOUT CARDIOVERSION N/A 07/25/2019   Procedure: TRANSESOPHAGEAL ECHOCARDIOGRAM (TEE);  Surgeon: Corey Skains, MD;  Location: [facility] ORS;  Service: Cardiovascular;  Laterality: N/A;   TONSILLECTOMY      FAMILY HISTORY: Family History  Problem Relation Age of Onset   Breast cancer Paternal 20    Ovarian cancer Sister    Prostate cancer Father  Stroke Mother    Kidney cancer Neg Hx    Bladder Cancer Neg Hx     ADVANCED DIRECTIVES (Y/N):  N  HEALTH MAINTENANCE: Social History   Tobacco Use   Smoking status: Never   Smokeless tobacco: Never  Vaping Use   Vaping Use: Never used  Substance Use Topics   Alcohol use: No   Drug use: No     Colonoscopy:  PAP:  Bone density:  Lipid panel:  Allergies  Allergen Reactions   Ciprofloxacin Hives   Codeine Other (See Comments)    HALLUCINATIONS   Tape     Rash and skin irritation/ paper tape and tegaderm OK   Ciprocinonide [Fluocinolone] Other (See Comments)    unknown   Amoxicillin Other (See Comments)    Upset stomach Has patient had a PCN reaction causing immediate rash, facial/tongue/throat swelling, SOB or lightheadedness with hypotension: No Has patient had a PCN reaction causing severe rash involving mucus membranes or skin necrosis:  No Has patient had a PCN reaction that required hospitalization: No Has patient had a PCN reaction occurring within the last 10 years: Yes If all of the above answers are "NO", then may proceed with Cephalosporin use.;   Cefuroxime Other (See Comments)    OK INTRACAMERALLY PER DR WLP, upset stomach   Latex Rash    Rast test NEGATIVE   Lipitor [Atorvastatin] Other (See Comments)    unknown    Current Outpatient Medications  Medication Sig Dispense Refill   Biotin 10 MG TABS Take 10 mg by mouth at bedtime.     diclofenac Sodium (VOLTAREN) 1 % GEL Apply 4 g topically 4 (four) times daily.     gabapentin (NEURONTIN) 100 MG capsule Take 100 mg by mouth 3 (three) times daily.     GLIPIZIDE XL 2.5 MG 24 hr tablet Take 2.5 mg by mouth daily.      letrozole (FEMARA) 2.5 MG tablet TAKE ONE TABLET BY MOUTH EVERY DAY 90 tablet 3   loratadine (CLARITIN) 10 MG tablet Take 10 mg by mouth daily.     metFORMIN (GLUCOPHAGE) 500 MG tablet Take 500 mg by mouth daily with breakfast.     metoprolol tartrate (LOPRESSOR) 25 MG tablet Take 1 tablet (25 mg total) by mouth 3 (three) times daily. 60 tablet 1   Multiple Vitamin (MULTIVITAMIN WITH MINERALS) TABS tablet Take 1 tablet by mouth daily.     nystatin (MYCOSTATIN/NYSTOP) powder Apply 1 application  topically. Apply to right groin daily     omeprazole (PRILOSEC) 40 MG capsule Take 40 mg by mouth daily.      PARoxetine (PAXIL) 30 MG tablet Take 30 mg by mouth daily.     Polyethyl Glycol-Propyl Glycol 0.4-0.3 % SOLN Apply to eye 2 (two) times daily as needed.     PRADAXA 150 MG CAPS capsule TAKE 1 CAPSULE BY MOUTH TWICE DAILY START TREATMENT 10 HRS LAST DOSE OF LOVENOX (Patient taking differently: Take 150 mg by mouth 2 (two) times daily.) 60 capsule 2   acetaminophen (TYLENOL) 500 MG tablet Take 500 mg by mouth every 6 (six) hours as needed.     acetaminophen (TYLENOL) 650 MG CR tablet Take 650 mg by mouth every 8 (eight) hours as needed for pain.     Blood  Glucose Monitoring Suppl (FIFTY50 GLUCOSE METER 2.0) w/Device KIT See admin instructions. (Patient not taking: Reported on 01/22/2021)     Cholecalciferol 25 MCG (1000 UT) tablet Take 1,000 Units by mouth daily.  Dentifrices (BIOTENE DRY MOUTH DT) by Transmucosal route.     tiZANidine (ZANAFLEX) 2 MG tablet Take 2 mg by mouth every 12 (twelve) hours as needed for muscle spasms. (Patient not taking: Reported on 01/24/2022)     No current facility-administered medications for this visit.    OBJECTIVE: Vitals:   01/24/22 1112  BP: (!) 129/55  Pulse: (!) 51  Resp: 20  Temp: 97.9 F (36.6 C)  SpO2: 97%      Body mass index is 30.65 kg/m.    ECOG FS:1 - Symptomatic but completely ambulatory  General: Well-developed, well-nourished, no acute distress.  Sitting in a wheelchair. Accompanied by CMA Eyes: Pink conjunctiva, anicteric sclera. HEENT: Normocephalic, moist mucous membranes. Breast: Exam deferred today. Lungs: No audible wheezing or coughing. Heart: Regular rate and rhythm. Abdomen: Soft, nontender, no obvious distention. Musculoskeletal: No edema, cyanosis, or clubbing.  Left hemipelvectomy Neuro: Alert, answering all questions appropriately. Cranial nerves grossly intact. Skin: No rashes or petechiae noted. Psych: Normal affect.   LAB RESULTS:  Lab Results  Component Value Date   NA 143 01/24/2022   K 4.7 01/24/2022   CL 107 01/24/2022   CO2 31 01/24/2022   GLUCOSE 198 (H) 01/24/2022   BUN 22 01/24/2022   CREATININE 0.69 01/24/2022   CALCIUM 11.8 (H) 01/24/2022   PROT 6.0 (L) 12/17/2021   ALBUMIN 2.7 (L) 12/17/2021   AST 25 12/17/2021   ALT 26 12/17/2021   ALKPHOS 45 12/17/2021   BILITOT 0.5 12/17/2021   GFRNONAA >60 01/24/2022   GFRAA >60 01/24/2020    Lab Results  Component Value Date   WBC 9.3 01/24/2022   NEUTROABS 5.9 01/24/2022   HGB 12.2 01/24/2022   HCT 41.0 01/24/2022   MCV 86.0 01/24/2022   PLT 218 01/24/2022     STUDIES: No results  found.  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  Homozygous factor V Leiden.  PLAN:    1.  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  Patient had a lumpectomy on July 27, 2017.  She completed radiation with MammoSite treatment.  Given ER/PR positivity  she was felt to benefit from adjuvant endocrine therapy. Plan is to complete 5 years of treatment in May 2024. Continue letrozole. Tolerating well. Her most recent mammogram was June 19th, 2023 and was reported as Bi-RADS 1. Repeat in June 2024.   2.  Osteoporosis: Patient's October 18, 2020 revealed a T score of -3.5 which was slightly worse than 1 year prior.  Her most recent bone density test was June 2023 which was  T score of -2.2, improved. She will receive Prolia today and again in 6 months. Given hypercalcemia, I've asked her to avoid calcium and vitamin D supplements.     3.  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  4.  Homozygous factor V Leiden: Patient had multiple splenic infarcts suspicious for thrombosis while taking Xarelto.  Subsequent hypercoagulable work-up revealed the patient is homozygous for factor V Leiden.  Continue Pradaxa 150 mg every 12 hours.  She will require lifelong anticoagulation.    5. Hypercalcemia- new problem. Calcium 11.8 in clinic today. She's asymptomatic. No calcium containing  medications but does take vitaminD which I've asked her to stop. Plan to check PTH and ionized calcium. Discussed with Dr. Grayland Ormond who recommends proceeding with prolia as scheduled. Will also get CT C/A/P to evaluate for recurrent malignancy given her  hx of sarcoma and breast cancer. Patient wishes to undergo scans but says she's undecided on if she would undergo invasive procedures or treatments.   Disposition:  CT C/A/P in next week 1 week- lab only (bmp) 2 week lab (bmp), see me for results and follow up- la  I spent a total of 30 minutes reviewing chart data, face-to-face evaluation with the patient, counseling and coordination of care as detailed above.  Patient expressed understanding and was in agreement with this plan. She also understands that She can call clinic at any time with any questions, concerns, or complaints.    Cancer Staging  Primary cancer of upper outer quadrant of left female breast [facility]) Staging form: Breast, AJCC 8th Edition - Clinical stage from 06/12/2017: Stage IA (cT1b, cN0, cM0, G2, ER+, PR+, HER2-) - Signed by Lloyd Huger, MD on 06/12/2017 Histologic grading system: 3 grade system Laterality: Left   Verlon Au, NP   01/24/2022

## 2022-01-26 LAB — PTH, INTACT AND CALCIUM
Calcium, Total (PTH): 11.9 mg/dL — ABNORMAL HIGH (ref 8.7–10.3)
PTH: 39 pg/mL (ref 15–65)

## 2022-01-26 LAB — CALCIUM, IONIZED: Calcium, Ionized, Serum: 6.7 mg/dL — ABNORMAL HIGH (ref 4.5–5.6)

## 2022-01-31 ENCOUNTER — Inpatient Hospital Stay: Payer: Medicare Other

## 2022-01-31 DIAGNOSIS — M810 Age-related osteoporosis without current pathological fracture: Secondary | ICD-10-CM | POA: Diagnosis not present

## 2022-01-31 LAB — BASIC METABOLIC PANEL
Anion gap: 4 — ABNORMAL LOW (ref 5–15)
BUN: 22 mg/dL (ref 8–23)
CO2: 23 mmol/L (ref 22–32)
Calcium: 9.7 mg/dL (ref 8.9–10.3)
Chloride: 110 mmol/L (ref 98–111)
Creatinine, Ser: 0.74 mg/dL (ref 0.44–1.00)
GFR, Estimated: >60 mL/min (ref >60)
Glucose, Bld: 236 mg/dL — ABNORMAL HIGH (ref 70–99)
Potassium: 4.8 mmol/L (ref 3.5–5.1)
Sodium: 137 mmol/L (ref 135–145)

## 2022-02-06 ENCOUNTER — Ambulatory Visit
Admission: RE | Admit: 2022-02-06 | Discharge: 2022-02-06 | Disposition: A | Discharge status: Home / Self Care | Payer: Medicare Other | Source: Ambulatory Visit | Attending: Nurse Practitioner | Admitting: Nurse Practitioner

## 2022-02-06 DIAGNOSIS — C50412 Malignant neoplasm of upper-outer quadrant of left female breast: Secondary | ICD-10-CM

## 2022-02-06 MED ORDER — IOHEXOL 300 MG/ML  SOLN
100.0000 mL | Freq: Once | INTRAMUSCULAR | Status: DC | PRN
Start: 2022-02-06 — End: 2022-02-07

## 2022-02-20 ENCOUNTER — Telehealth: Payer: Self-pay

## 2022-02-20 NOTE — Telephone Encounter (Signed)
Called spoke to patient's daughter requested by Ander Purpura, NP regarding patient's recent refused CT scan on 10/5. Per daughter patient most likely refused because she is claustrophobic and will need a sedative if scan is still needed. Daughter will speak to patient to encourage scan and she/patient will call [facility] back to reschedule with possible sedative if they decide to proceed with the scan.

## 2022-03-05 LAB — CBC: RBC: 5.02 (ref 3.87–5.11)

## 2022-03-05 LAB — HEPATIC FUNCTION PANEL
ALT: 10 U/L (ref 7–35)
AST: 18 (ref 13–35)
Alkaline Phosphatase: 65 (ref 25–125)
Bilirubin, Total: 0.3

## 2022-03-05 LAB — CBC AND DIFFERENTIAL
HCT: 42 (ref 36–46)
Hemoglobin: 13.1 (ref 12.0–16.0)
Neutrophils Absolute: 5616
Platelets: 205 10*3/uL (ref 150–400)
WBC: 8.6

## 2022-03-05 LAB — BASIC METABOLIC PANEL
BUN: 18 (ref 4–21)
CO2: 29 — AB (ref 13–22)
Chloride: 103 (ref 99–108)
Creatinine: 0.5 (ref 0.5–1.1)
Glucose: 167
Glucose: 167
Potassium: 4.3 mEq/L (ref 3.5–5.1)
Sodium: 141 (ref 137–147)

## 2022-03-05 LAB — COMPREHENSIVE METABOLIC PANEL
Albumin: 4 (ref 3.5–5.0)
Calcium: 10.7 (ref 8.7–10.7)
Globulin: 2.6
eGFR: 91

## 2022-03-05 LAB — TSH: TSH: 0.1 — AB (ref 0.41–5.90)

## 2022-03-05 LAB — HEMOGLOBIN A1C: Hemoglobin A1C: 7.4

## 2022-03-06 ENCOUNTER — Encounter: Payer: Self-pay | Admitting: Oncology

## 2022-03-06 NOTE — Telephone Encounter (Signed)
Signing encounter, see note 02/20/22 

## 2022-03-19 ENCOUNTER — Encounter: Payer: Self-pay | Admitting: Student

## 2022-03-19 ENCOUNTER — Non-Acute Institutional Stay (SKILLED_NURSING_FACILITY): Payer: Medicare Other | Admitting: Student

## 2022-03-19 DIAGNOSIS — E119 Type 2 diabetes mellitus without complications: Secondary | ICD-10-CM | POA: Diagnosis not present

## 2022-03-19 DIAGNOSIS — I21A1 Myocardial infarction type 2: Secondary | ICD-10-CM

## 2022-03-19 DIAGNOSIS — I10 Essential (primary) hypertension: Secondary | ICD-10-CM

## 2022-03-19 DIAGNOSIS — F341 Dysthymic disorder: Secondary | ICD-10-CM

## 2022-03-19 DIAGNOSIS — K529 Noninfective gastroenteritis and colitis, unspecified: Secondary | ICD-10-CM

## 2022-03-19 DIAGNOSIS — I480 Paroxysmal atrial fibrillation: Secondary | ICD-10-CM

## 2022-03-19 DIAGNOSIS — C50412 Malignant neoplasm of upper-outer quadrant of left female breast: Secondary | ICD-10-CM

## 2022-03-19 DIAGNOSIS — M1711 Unilateral primary osteoarthritis, right knee: Secondary | ICD-10-CM

## 2022-03-19 DIAGNOSIS — E0590 Thyrotoxicosis, unspecified without thyrotoxic crisis or storm: Secondary | ICD-10-CM

## 2022-03-19 DIAGNOSIS — D689 Coagulation defect, unspecified: Secondary | ICD-10-CM

## 2022-03-19 DIAGNOSIS — I739 Peripheral vascular disease, unspecified: Secondary | ICD-10-CM

## 2022-03-19 NOTE — Progress Notes (Signed)
Location:  Other [facility].  Nursing Home Room Number: [place] of Service:  SNF [phone]) Provider:  Dr. Amada Kingfisher, MD  Patient Care Team: Dewayne Shorter, MD as PCP - General (Family Medicine) Bary Castilla [place] Gleason, MD (General Surgery) Lloyd Huger, MD as Consulting Physician (Oncology)  Extended Emergency Contact Information Primary Emergency Contact: Elwin Mocha Address: [address]          [place], [place] [address] Johnnette Litter of [place] Phone: [phone] Mobile Phone: [phone] Relation: Daughter  Code Status:  DNR Goals of care: Advanced Directive information    03/19/2022   10:12 AM  Advanced Directives  Does Patient Have a Medical Advance Directive? Yes  Type of Advance Directive Out of facility DNR (pink MOST or yellow form)  Does patient want to make changes to medical advance directive? No - Patient declined     Chief Complaint  Patient presents with   Medical Management of Chronic Issues    Medical Management of Chronic Issues.     HPI:  Pt is a 88 y.o. female seen today for medical management of chronic diseases.   She still has knee pain that prevents her form putting weight on it and can't stand well. Generally she can't put in weight on her knee. She is back in physical therapy. She got an injection and it hsan't helped much. She is doing all of the exercises and she has noticed when she gets on the lift to go to the bathroom there are times her knee doesn't hurt as much (4x per day). Her shoulders hurt a bit too and it's from when she stands up on the machine. She has arthritis all over. She was on crutches for 40 years.   She denies CP, SOB. She had BM on Saturday and yesterday, used to be with every breakfast, now it's less. She drinks plenty of fluids. 2-3x urination overnight.   She is conscious of eating vegetables (sometimes eats a lot of potatoes). She wants to lose some weight.   It's  Wednesday March 18 2022. [id number] is coming up. She will eat here. Her daughter who lives in [place] has 4 children and a MIL who lives next door. Steps and she can't go.   Brain is   Past Medical History:  Diagnosis Date   Anxiety    Benign breast lumps    Blood clotting disorder (HCC)    Bone cancer (HCC)    head of femur, left leg ... amputation at age 50   Breast cancer of upper-outer quadrant of left female breast ([facility]) 07/27/2017   11 mm invasive mammary carcinoma, ER 90%, PR 51-90%, negative margins.  Oncotype recurrence score: 1.  DCIS, margin less than 0.5 mm.  Negative sentinel node.  Accelerated partial breast radiation.   Cervicalgia    Chronic kidney disease    kidney stones   Colon cancer [facility])    patient unaware of this   Complication of anesthesia    mood alteration / not sure if d/t pain medicine or anesthesia as she passed out    Cough    NASAL DRIP / SNEEZING / SORE THROAT MOSTLY CONSTANT   Depression    Diabetes ([place])    Diverticulitis    Dizzy    GERD (gastroesophageal reflux disease)    H/O blood clots    arm and leg   HBP (high blood pressure)    Hematuria    gross  Hemorrhoids    HLD (hyperlipidemia)    Muscle pain    Osteoarthritis    Osteoporosis    Panic disorder    Personal history of radiation therapy    PND (post-nasal drip)    CHRONIC WITH SORE THROAT AND SNEEZING   Reflux    Skin cancer    nose   Swelling    Tremor    Tremors of nervous system    Past Surgical History:  Procedure Laterality Date   APPENDECTOMY  1962   BREAST BIOPSY Right 2006?   benign   BREAST BIOPSY Left 2019   invasive mammary carcinoma   BREAST CYST EXCISION Left 07/27/2017   Procedure: SKIN CYST EXCISED;  Surgeon: Robert Bellow, MD;  Location: [facility] ORS;  Service: General;  Laterality: Left;   BREAST LUMPECTOMY Left 07/2017   invasive mammary, DCIS  mammosite   BREAST SURGERY Right 2019   CARPAL TUNNEL RELEASE Right    CATARACT EXTRACTION  W/PHACO Left 11/11/2016   Procedure: CATARACT EXTRACTION PHACO AND INTRAOCULAR LENS PLACEMENT ([place]);  Surgeon: Birder Robson, MD;  Location: [facility] ORS;  Service: Ophthalmology;  Laterality: Left;  [place] 01:07.9 AP% 19.2 CDE 13.02 Fluid pack lot # [id number]   CATARACT EXTRACTION W/PHACO Right 12/09/2016   Procedure: CATARACT EXTRACTION PHACO AND INTRAOCULAR LENS PLACEMENT (I[facility]);  Surgeon: Birder Robson, MD;  Location: [facility] ORS;  Service: Ophthalmology;  Laterality: Right;  [place] 00:49 AP% 21.2 CDE 10.39 Fluid pack lot # [id number]   CHOLECYSTECTOMY     COLONOSCOPY WITH PROPOFOL N/A 11/22/2014   Procedure: COLONOSCOPY WITH PROPOFOL;  Surgeon: Manya Silvas, MD;  Location: [facility] ENDOSCOPY;  Service: Endoscopy;  Laterality: N/A;   CYSTOSCOPY WITH STENT PLACEMENT Bilateral 11/27/2018   Procedure: CYSTOSCOPY WITH STENT PLACEMENT;  Surgeon: Festus Aloe, MD;  Location: [facility] ORS;  Service: Urology;  Laterality: Bilateral;   CYSTOSCOPY/URETEROSCOPY/HOLMIUM LASER/STENT PLACEMENT Bilateral 12/17/2018   Procedure: CYSTOSCOPY/URETEROSCOPY/HOLMIUM LASER/STENT Exchange;  Surgeon: Billey Co, MD;  Location: [facility] ORS;  Service: Urology;  Laterality: Bilateral;   ESOPHAGOGASTRODUODENOSCOPY  11/22/2014   Procedure: ESOPHAGOGASTRODUODENOSCOPY (EGD);  Surgeon: Manya Silvas, MD;  Location: [facility] ENDOSCOPY;  Service: Endoscopy;;   ESOPHAGOGASTRODUODENOSCOPY (EGD) WITH PROPOFOL N/A 03/05/2017   Procedure: ESOPHAGOGASTRODUODENOSCOPY (EGD) WITH PROPOFOL;  Surgeon: Lin Landsman, MD;  Location: [facility] ENDOSCOPY;  Service: Gastroenterology;  Laterality: N/A;   EXTRACORPOREAL SHOCK WAVE LITHOTRIPSY Right 03/04/2018   Procedure: EXTRACORPOREAL SHOCK WAVE LITHOTRIPSY (ESWL);  Surgeon: Billey Co, MD;  Location: [facility] ORS;  Service: Urology;  Laterality: Right;   EYE SURGERY Bilateral 2012   cataract extraction with iol   GALLBLADDER SURGERY  1968   hemi pelvectomy     left side at age 66   HEMIPELVIC  Left [place] Left    AMPUTATION d/t cancer at 88 years old   MASTECTOMY, PARTIAL Left 07/27/2017   Procedure: MASTECTOMY PARTIAL;  Surgeon: Robert Bellow, MD;  Location: [facility] ORS;  Service: General;  Laterality: Left;   MOUTH SURGERY  2019   teeth pulled with a bridge insertion.   fell out 12/16/18   OVARY SURGERY Right    cyst removed   SAVORY DILATION  11/22/2014   Procedure: SAVORY DILATION;  Surgeon: Manya Silvas, MD;  Location: [facility] ENDOSCOPY;  Service: Endoscopy;;   SENTINEL NODE BIOPSY Left 07/27/2017   Procedure: SENTINEL NODE BIOPSY;  Surgeon: Robert Bellow, MD;  Location: [facility] ORS;  Service: General;  Laterality: Left;   SKIN CANCER EXCISION  nose and arm and face   TEE WITHOUT CARDIOVERSION N/A 07/25/2019   Procedure: TRANSESOPHAGEAL ECHOCARDIOGRAM (TEE);  Surgeon: Corey Skains, MD;  Location: [facility] ORS;  Service: Cardiovascular;  Laterality: N/A;   TONSILLECTOMY      Allergies  Allergen Reactions   Ciprofloxacin Hives   Codeine Other (See Comments)    HALLUCINATIONS   Tape     Rash and skin irritation/ paper tape and tegaderm OK   Ciprocinonide [Fluocinolone] Other (See Comments)    unknown   Amoxicillin Other (See Comments)    Upset stomach Has patient had a PCN reaction causing immediate rash, facial/tongue/throat swelling, SOB or lightheadedness with hypotension: No Has patient had a PCN reaction causing severe rash involving mucus membranes or skin necrosis: No Has patient had a PCN reaction that required hospitalization: No Has patient had a PCN reaction occurring within the last 10 years: Yes If all of the above answers are "NO", then may proceed with Cephalosporin use.;   Cefuroxime Other (See Comments)    OK INTRACAMERALLY PER DR WLP, upset stomach   Latex Rash    Rast test NEGATIVE   Lipitor [Atorvastatin] Other (See Comments)    unknown    Outpatient Encounter Medications as of 03/19/2022  Medication Sig   acetaminophen  (TYLENOL) 500 MG tablet Take 500 mg by mouth every 6 (six) hours as needed.   acetaminophen (TYLENOL) 650 MG CR tablet Take 650 mg by mouth every 8 (eight) hours as needed for pain.   Biotin 10 MG TABS Take 10 mg by mouth at bedtime.   diclofenac Sodium (VOLTAREN) 1 % GEL Apply 4 g topically. Apply to stump topically every 6 hours as needed. Apply to right knee topically three times daily for pain.   gabapentin (NEURONTIN) 100 MG capsule Take 100 mg by mouth 3 (three) times daily.   GLIPIZIDE XL 2.5 MG 24 hr tablet Take 2.5 mg by mouth daily.    hydrocortisone 2.5 % cream Apply to Hemorrhoids/buttucks topically every 8 hours as needed for hemorrhoids.   letrozole (FEMARA) 2.5 MG tablet TAKE ONE TABLET BY MOUTH EVERY DAY   loratadine (CLARITIN) 10 MG tablet Take 10 mg by mouth daily.   melatonin 3 MG TABS tablet Take 3 mg by mouth at bedtime.   metFORMIN (GLUCOPHAGE) 500 MG tablet Take 500 mg by mouth daily with breakfast.   metoprolol tartrate (LOPRESSOR) 25 MG tablet Take 1 tablet (25 mg total) by mouth 3 (three) times daily.   Multiple Vitamin (MULTIVITAMIN WITH MINERALS) TABS tablet Take 1 tablet by mouth daily.   omeprazole (PRILOSEC) 40 MG capsule Take 40 mg by mouth daily.    ondansetron (ZOFRAN) 4 MG tablet Take 4 mg by mouth every 4 (four) hours as needed for nausea or vomiting.   OXYGEN 2lpm as needed for sats less than 88% every 1 hour as needed   PARoxetine (PAXIL) 30 MG tablet Take 30 mg by mouth daily.   Polyethyl Glycol-Propyl Glycol 0.4-0.3 % SOLN Apply to eye 2 (two) times daily as needed.   polyethylene glycol (MIRALAX / GLYCOLAX) 17 g packet Take 17 g by mouth daily as needed.   PRADAXA 150 MG CAPS capsule TAKE 1 CAPSULE BY MOUTH TWICE DAILY START TREATMENT 10 HRS LAST DOSE OF LOVENOX   tiZANidine (ZANAFLEX) 2 MG tablet Take 2 mg by mouth every 12 (twelve) hours as needed for muscle spasms.   [DISCONTINUED] diclofenac Sodium (VOLTAREN) 1 % GEL Apply 4 g topically every 6 (six)  hours as needed.   [DISCONTINUED] Blood Glucose Monitoring Suppl (FIFTY50 GLUCOSE METER 2.0) w/Device KIT See admin instructions. (Patient not taking: Reported on 01/22/2021)   [DISCONTINUED] Cholecalciferol 25 MCG (1000 UT) tablet Take 1,000 Units by mouth daily.   [DISCONTINUED] Dentifrices (BIOTENE DRY MOUTH DT) by Transmucosal route.   [DISCONTINUED] nystatin (MYCOSTATIN/NYSTOP) powder Apply 1 application  topically. Apply to right groin daily   No facility-administered encounter medications on file as of 03/19/2022.    Review of Systems  All other systems reviewed and are negative.   Immunization History  Administered Date(s) Administered   Influenza-Unspecified 02/18/2022   Pneumococcal Conjugate-13 09/28/2017   Pertinent  Health Maintenance Due  Topic Date Due   OPHTHALMOLOGY EXAM  Never done   FOOT EXAM  11/16/2020   HEMOGLOBIN A1C  09/03/2022   INFLUENZA VACCINE  Completed   DEXA SCAN  Completed      12/18/2021    9:00 AM 12/18/2021    7:45 PM 12/19/2021    7:00 AM 12/19/2021   12:00 PM 01/24/2022   11:20 AM  Fall Risk  Patient Fall Risk Level _0    Functional Status Survey:    Vitals:   03/19/22 1020  BP: 131/68  Pulse: (!) 59  Resp: 18  Temp: 97.7 F (36.5 C)  SpO2: 90%  Weight: 161 lb 9.6 oz (73.3 kg)  Height: _1  (1.6 m)   Body mass index is 28.63 kg/m. Physical Exam Constitutional:      Appearance: She is obese.  Cardiovascular:     Rate and Rhythm: Normal rate.     Pulses: Normal pulses.     Heart sounds: Normal heart sounds.  Pulmonary:     Effort: Pulmonary effort is normal.     Breath sounds: Normal breath sounds.  Abdominal:     General: Bowel sounds are normal.     Palpations: Abdomen is soft.  Musculoskeletal:     Comments: Left hemipelvectomy  Skin:    General: Skin is warm and dry.  Neurological:     Mental Status: She is alert and oriented to person, place,  and time. Mental status is at baseline.  Psychiatric:        Mood and Affect: Mood normal.        Behavior: Behavior normal.        Thought Content: Thought content normal.        Judgment: Judgment normal.     Labs reviewed: Recent Labs    12/17/21 0533 12/18/21 1207 12/19/21 0621 01/24/22 1051 01/24/22 1157 01/31/22 1053 03/05/22 0000  NA 136  --  139 143  --  137 141  K 3.5  --  3.6 4.7  --  4.8 4.3  CL 109  --  112* 107  --  110 103  CO2 20*  --  20* 31  --  23 29*  GLUCOSE 199*  --  243* 198*  --  236*  --   BUN 28*  --  21 22  --  22 18  CREATININE 0.61  --  0.53 0.69  --  0.74 0.5  CALCIUM 9.5  --  9.4 11.8* 11.9* 9.7 10.7  MG 1.5* 1.8  --   --   --   --   --    Recent Labs    12/15/21 1011 12/17/21 0533 03/05/22 0000  AST 33 25 18  ALT 17 26 10  ALKPHOS 43 45 65  BILITOT 0.5 0.5  --   PROT 6.8 6.0*  --   ALBUMIN 3.3* 2.7* 4.0   Recent Labs    12/15/21 1545 12/16/21 0529 12/17/21 0533 01/24/22 1051 03/05/22 0000  WBC 15.3* 12.3* 8.6 9.3 8.6  NEUTROABS 13.0*  --   --  5.9 5,616.00  HGB 10.9* 10.5* 10.9* 12.2 13.1  HCT 36.4 34.8* 36.4 41.0 42  MCV 82.0 81.7 81.3 86.0  --   PLT 156 127* 125* 218 205   Lab Results  Component Value Date   TSH 0.10 (A) 03/05/2022   Lab Results  Component Value Date   HGBA1C 7.4 03/05/2022   Lab Results  Component Value Date   CHOL 110 12/15/2021   HDL 19 (L) 12/15/2021   LDLCALC 46 12/15/2021   TRIG 223 (H) 12/15/2021   CHOLHDL 5.8 12/15/2021    Significant Diagnostic Results in last 30 days:  No results found.  Assessment/Plan 1. Primary hypertension BP well-controlled on current regimen  2. Controlled type 2 diabetes mellitus without complication, without long-term current use of insulin ([place]) DM well-controlled on current regimen. Continue Glipizide 2.5 mg q24 hours. Continue metformin 500 mg daily.   3. Paroxysmal atrial fibrillation (HCC) HR well-controlled with metoprolol 25 mg TID  4. Type 2  MI (myocardial infarction) ([place]) No symptoms at this time. Continue Pradaxa 150 mg.   5. PAD (peripheral artery disease) ([place]) Continue exercise to prevent pain associated with disease.   6. Subclinical Hyperthyroidism Normal T4. Patient's labs have recently changed to be slightly abnormal. Patient is asymptomatic. She was referred to endocrinology, however, never had an appointment. Will place and additional referral. In the meantime plan to continue to monitor with q44moTsh, T3 and T4.  TSH  Date/Time Value Ref Range Status  03/05/2022 12:00 AM 0.10 (A) 0.41 - 5.90 Final  12/17/2021 05:33 AM <0.010 (L) 0.350 - 4.500 uIU/mL Final    Comment:    Performed by a 3rd Generation assay with a functional sensitivity of <=0.01 uIU/mL. [facility] [address], [place] [id number]    7. Coagulation disorder (HCC)nu Patient is on Pradaxa 150 mg. Continue. No signs of clots at this time.   8. Persistent depressive disorder Discussed at length importance of medication changes given her persistent symptoms. She is not interested in a change in medication at this time. She understands Paxil is not the best medication for aging patients. She would like to continue Paxil 30 mg daily.   9. Chronic diarrhea Patient denies current symptoms. Continue Prn peptobismol.   10. Primary cancer of upper outer quadrant of left female breast ([facility] S/p Mastectomy. Continue Letrozole.   11. Arthritis Patient has persistent pain despite steroid injections, topical diclofenac, tylenol. She is also in phyical therapy Will continue multimodal pain control at this time in an effort to maintain good quality of life.    Family/ staff Communication: nurse  Labs/tests ordered:  q675mohyroid labs, endocrinology referraL.  ViTomasa RandMD, MSBloomdaleenior Care [phone]

## 2022-04-07 LAB — TSH: TSH: 0.11 — AB (ref 0.41–5.90)

## 2022-04-15 ENCOUNTER — Non-Acute Institutional Stay (SKILLED_NURSING_FACILITY): Payer: Medicare Other | Admitting: Nurse Practitioner

## 2022-04-15 ENCOUNTER — Encounter: Payer: Self-pay | Admitting: Nurse Practitioner

## 2022-04-15 DIAGNOSIS — L539 Erythematous condition, unspecified: Secondary | ICD-10-CM

## 2022-04-15 NOTE — Progress Notes (Signed)
Location:  [facility] Room Number: 215-A Place of Service:  SNF [phone]) Provider:  Micah Flesher, MD  Patient Care Team: Dewayne Shorter, MD as PCP - General (Family Medicine) Bary Castilla Forest Gleason, MD (General Surgery) Lloyd Huger, MD as Consulting Physician (Oncology)  Extended Emergency Contact Information Primary Emergency Contact: Elwin Mocha Address: [address]          [place], [place] [address] [place] of [place] Phone: [phone] Mobile Phone: [phone] Relation: Daughter  Code Status:  DNR Goals of care: Advanced Directive information    04/15/2022    2:24 PM  Advanced Directives  Does Patient Have a Medical Advance Directive? Yes  Type of Advance Directive Out of facility DNR (pink MOST or yellow form)  Does patient want to make changes to medical advance directive? No - Patient declined  Pre-existing out of facility DNR order (yellow form or pink MOST form) Yellow form placed in chart (order not valid for inpatient use)     Chief Complaint  Patient presents with   Acute Visit    Red fingers    HPI:  Pt is a 88 y.o. female seen today for an acute visit for ongoing red finger.  Epsom salt soaks were started last week for redness, swelling to ring finger. Nursing reports there was some purulent drainage but finger remains red.    Past Medical History:  Diagnosis Date   Anxiety    Benign breast lumps    Blood clotting disorder (HCC)    Bone cancer (HCC)    head of femur, left leg ... amputation at age 21   Breast cancer of upper-outer quadrant of left female breast ([place]) 07/27/2017   11 mm invasive mammary carcinoma, ER 90%, PR 51-90%, negative margins.  Oncotype recurrence score: 1.  DCIS, margin less than 0.5 mm.  Negative sentinel node.  Accelerated partial breast radiation.   Cervicalgia    Chronic kidney disease    kidney stones   Colon cancer [facility])    patient unaware of this    Complication of anesthesia    mood alteration / not sure if d/t pain medicine or anesthesia as she passed out    Cough    NASAL DRIP / SNEEZING / SORE THROAT MOSTLY CONSTANT   Depression    Diabetes ([place])    Diverticulitis    Dizzy    GERD (gastroesophageal reflux disease)    H/O blood clots    arm and leg   HBP (high blood pressure)    Hematuria    gross   Hemorrhoids    HLD (hyperlipidemia)    Microscopic hematuria 06/02/2015   Muscle pain    Osteoarthritis    Osteoporosis    Panic disorder    Personal history of radiation therapy    PND (post-nasal drip)    CHRONIC WITH SORE THROAT AND SNEEZING   Reflux    Sepsis ([place])    Sepsis, unspecified organism ([place]) 12/15/2021   Formatting of this note might be different from the original.  Last Assessment & Plan: Formatting of this note might be different from the original. Appears to be multifactorial and secondary to healthcare associated pneumonia as well as a UTI CT angio shows asymmetric airspace disease at the left base and posteriorly in the right upper lobe concerning for pneumonia Patient also has pyuria Patient   Severe sepsis ([place]) 12/15/2021   Skin cancer    nose   Swelling  Tremor    Tremors of nervous system    Ureteral stone with hydronephrosis 11/26/2018   Past Surgical History:  Procedure Laterality Date   APPENDECTOMY  1962   BREAST BIOPSY Right 2006?   benign   BREAST BIOPSY Left 2019   invasive mammary carcinoma   BREAST CYST EXCISION Left 07/27/2017   Procedure: SKIN CYST EXCISED;  Surgeon: Robert Bellow, MD;  Location: [facility] ORS;  Service: General;  Laterality: Left;   BREAST LUMPECTOMY Left 07/2017   invasive mammary, DCIS  mammosite   BREAST SURGERY Right 2019   CARPAL TUNNEL RELEASE Right    CATARACT EXTRACTION W/PHACO Left 11/11/2016   Procedure: CATARACT EXTRACTION PHACO AND INTRAOCULAR LENS PLACEMENT ([place]);  Surgeon: Birder Robson, MD;  Location: [facility] ORS;  Service: Ophthalmology;   Laterality: Left;  [place] 01:07.9 AP% 19.2 CDE 13.02 Fluid pack lot # [id number]   CATARACT EXTRACTION W/PHACO Right 12/09/2016   Procedure: CATARACT EXTRACTION PHACO AND INTRAOCULAR LENS PLACEMENT (IOC);  Surgeon: Birder Robson, MD;  Location: [facility];  Service: Ophthalmology;  Laterality: Right;  [place] 00:49 AP% 21.2 CDE 10.39 Fluid pack lot # [id number]   CHOLECYSTECTOMY     COLONOSCOPY WITH PROPOFOL N/A 11/22/2014   Procedure: COLONOSCOPY WITH PROPOFOL;  Surgeon: Manya Silvas, MD;  Location: [facility]COPY;  Service: Endoscopy;  Laterality: N/A;   CYSTOSCOPY WITH STENT PLACEMENT Bilateral 11/27/2018   Procedure: CYSTOSCOPY WITH STENT PLACEMENT;  Surgeon: Festus Aloe, MD;  Location: [facility] ORS;  Service: Urology;  Laterality: Bilateral;   CYSTOSCOPY/URETEROSCOPY/HOLMIUM LASER/STENT PLACEMENT Bilateral 12/17/2018   Procedure: CYSTOSCOPY/URETEROSCOPY/HOLMIUM LASER/STENT Exchange;  Surgeon: Billey Co, MD;  Location: [facility] ORS;  Service: Urology;  Laterality: Bilateral;   ESOPHAGOGASTRODUODENOSCOPY  11/22/2014   Procedure: ESOPHAGOGASTRODUODENOSCOPY (EGD);  Surgeon: Manya Silvas, MD;  Location: [facility] ENDOSCOPY;  Service: Endoscopy;;   ESOPHAGOGASTRODUODENOSCOPY (EGD) WITH PROPOFOL N/A 03/05/2017   Procedure: ESOPHAGOGASTRODUODENOSCOPY (EGD) WITH PROPOFOL;  Surgeon: Lin Landsman, MD;  Location: [facility] ENDOSCOPY;  Service: Gastroenterology;  Laterality: N/A;   EXTRACORPOREAL SHOCK WAVE LITHOTRIPSY Right 03/04/2018   Procedure: EXTRACORPOREAL SHOCK WAVE LITHOTRIPSY (ESWL);  Surgeon: Billey Co, MD;  Location: [facility] ORS;  Service: Urology;  Laterality: Right;   EYE SURGERY Bilateral 2012   cataract extraction with iol   GALLBLADDER SURGERY  1968   hemi pelvectomy     left side at age 6   HEMIPELVIC Left [place] Left    AMPUTATION d/t cancer at 88 years old   MASTECTOMY, PARTIAL Left 07/27/2017   Procedure: MASTECTOMY PARTIAL;  Surgeon: Robert Bellow, MD;   Location: [facility] ORS;  Service: General;  Laterality: Left;   MOUTH SURGERY  2019   teeth pulled with a bridge insertion.   fell out 12/16/18   OVARY SURGERY Right    cyst removed   SAVORY DILATION  11/22/2014   Procedure: SAVORY DILATION;  Surgeon: Manya Silvas, MD;  Location: [facility] ENDOSCOPY;  Service: Endoscopy;;   SENTINEL NODE BIOPSY Left 07/27/2017   Procedure: SENTINEL NODE BIOPSY;  Surgeon: Robert Bellow, MD;  Location: [facility] ORS;  Service: General;  Laterality: Left;   SKIN CANCER EXCISION     nose and arm and face   TEE WITHOUT CARDIOVERSION N/A 07/25/2019   Procedure: TRANSESOPHAGEAL ECHOCARDIOGRAM (TEE);  Surgeon: Corey Skains, MD;  Location: [facility] ORS;  Service: Cardiovascular;  Laterality: N/A;   TONSILLECTOMY      Allergies  Allergen Reactions   Ciprofloxacin Hives   Codeine Other (See  Comments)    HALLUCINATIONS   Tape     Rash and skin irritation/ paper tape and tegaderm OK   Ciprocinonide [Fluocinolone] Other (See Comments)    unknown   Amoxicillin Other (See Comments)    Upset stomach Has patient had a PCN reaction causing immediate rash, facial/tongue/throat swelling, SOB or lightheadedness with hypotension: No Has patient had a PCN reaction causing severe rash involving mucus membranes or skin necrosis: No Has patient had a PCN reaction that required hospitalization: No Has patient had a PCN reaction occurring within the last 10 years: Yes If all of the above answers are "NO", then may proceed with Cephalosporin use.;   Cefuroxime Other (See Comments)    OK INTRACAMERALLY PER DR WLP, upset stomach   Latex Rash    Rast test NEGATIVE   Lipitor [Atorvastatin] Other (See Comments)    unknown    Outpatient Encounter Medications as of 04/15/2022  Medication Sig   acetaminophen (TYLENOL) 500 MG tablet Take 500 mg by mouth every 6 (six) hours as needed.   acetaminophen (TYLENOL) 650 MG CR tablet Take 650 mg by mouth every 8 (eight) hours as needed for  pain.   Biotin 10000 MCG TABS Take 10,000 mcg by mouth at bedtime.   diclofenac Sodium (VOLTAREN) 1 % GEL Apply 4 g topically. Apply to stump topically every 6 hours as needed. Apply to right knee topically three times daily for pain.   gabapentin (NEURONTIN) 100 MG capsule Take 100 mg by mouth 3 (three) times daily.   GLIPIZIDE XL 2.5 MG 24 hr tablet Take 2.5 mg by mouth daily.    hydrocortisone 2.5 % cream Apply to Hemorrhoids/buttucks topically every 8 hours as needed for hemorrhoids.   Infant Care Products (DERMACLOUD) OINT Apply to Sacrum topically every shift for prevention   letrozole (FEMARA) 2.5 MG tablet TAKE ONE TABLET BY MOUTH EVERY DAY   loratadine (CLARITIN) 10 MG tablet Take 10 mg by mouth daily.   melatonin 3 MG TABS tablet Take 3 mg by mouth at bedtime.   metFORMIN (GLUCOPHAGE) 500 MG tablet Take 500 mg by mouth daily with breakfast.   metoprolol tartrate (LOPRESSOR) 25 MG tablet Take 1 tablet (25 mg total) by mouth 3 (three) times daily.   Multiple Vitamin (MULTIVITAMIN WITH MINERALS) TABS tablet Take 1 tablet by mouth daily.   omeprazole (PRILOSEC) 40 MG capsule Take 40 mg by mouth daily.    ondansetron (ZOFRAN) 4 MG tablet Take 4 mg by mouth every 4 (four) hours as needed for nausea or vomiting.   OXYGEN 2lpm as needed for sats less than 88% every 1 hour as needed   PARoxetine (PAXIL) 30 MG tablet Take 30 mg by mouth daily.   Polyethyl Glycol-Propyl Glycol 0.4-0.3 % SOLN Apply to eye 2 (two) times daily as needed.   polyethylene glycol (MIRALAX / GLYCOLAX) 17 g packet Take 17 g by mouth daily as needed.   PRADAXA 150 MG CAPS capsule TAKE 1 CAPSULE BY MOUTH TWICE DAILY START TREATMENT 10 HRS LAST DOSE OF LOVENOX   tiZANidine (ZANAFLEX) 2 MG tablet Take 2 mg by mouth every 12 (twelve) hours as needed for muscle spasms.   [DISCONTINUED] Biotin 10 MG TABS Take 10 mg by mouth at bedtime.   No facility-administered encounter medications on file as of 04/15/2022.    Review of  Systems  Constitutional:  Negative for activity change and appetite change.  Skin:  Positive for color change. Negative for pallor, rash and wound.  Immunization History  Administered Date(s) Administered   Influenza-Unspecified 02/18/2022   Pneumococcal Conjugate-13 09/28/2017   Pertinent  Health Maintenance Due  Topic Date Due   OPHTHALMOLOGY EXAM  Never done   FOOT EXAM  11/16/2020   HEMOGLOBIN A1C  09/03/2022   INFLUENZA VACCINE  Completed   DEXA SCAN  Completed      12/18/2021    9:00 AM 12/18/2021    7:45 PM 12/19/2021    7:00 AM 12/19/2021   12:00 PM 01/24/2022   11:20 AM  Fall Risk  Patient Fall Risk Level High fall risk High fall risk High fall risk High fall risk High fall risk   Functional Status Survey:    Vitals:   04/15/22 1413  BP: (!) 141/66  Pulse: 61  Temp: (!) 97.1 F (36.2 C)  SpO2: 91%  Weight: 162 lb 12.8 oz (73.8 kg)  Height: '5\' 3"'$  (1.6 m)   Body mass index is 28.84 kg/m. Physical Exam Musculoskeletal:     Comments: Slight redness noted beside nail to right ring finger, no warmth, swelling or tenderness.      Labs reviewed: Recent Labs    12/17/21 0533 12/18/21 1207 12/19/21 0621 01/24/22 1051 01/24/22 1157 01/31/22 1053 03/05/22 0000  NA 136  --  139 143  --  137 141  K 3.5  --  3.6 4.7  --  4.8 4.3  CL 109  --  112* 107  --  110 103  CO2 20*  --  20* 31  --  23 29*  GLUCOSE 199*  --  243* 198*  --  236*  --   BUN 28*  --  21 22  --  22 18  CREATININE 0.61  --  0.53 0.69  --  0.74 0.5  CALCIUM 9.5  --  9.4 11.8* 11.9* 9.7 10.7  MG 1.5* 1.8  --   --   --   --   --    Recent Labs    12/15/21 1011 12/17/21 0533 03/05/22 0000  AST 33 25 18  ALT '17 26 10  '$ ALKPHOS 43 45 65  BILITOT 0.5 0.5  --   PROT 6.8 6.0*  --   ALBUMIN 3.3* 2.7* 4.0   Recent Labs    12/15/21 1545 12/16/21 0529 12/17/21 0533 01/24/22 1051 03/05/22 0000  WBC 15.3* 12.3* 8.6 9.3 8.6  NEUTROABS 13.0*  --   --  5.9 5,616.00  HGB 10.9* 10.5* 10.9*  12.2 13.1  HCT 36.4 34.8* 36.4 41.0 42  MCV 82.0 81.7 81.3 86.0  --   PLT 156 127* 125* 218 205   Lab Results  Component Value Date   TSH 0.11 (A) 04/07/2022   Lab Results  Component Value Date   HGBA1C 7.4 03/05/2022   Lab Results  Component Value Date   CHOL 110 12/15/2021   HDL 19 (L) 12/15/2021   LDLCALC 46 12/15/2021   TRIG 223 (H) 12/15/2021   CHOLHDL 5.8 12/15/2021    Significant Diagnostic Results in last 30 days:  No results found.  Assessment/Plan 1. Redness of skin -no signs of infection at this time, redness has significantly improved. Will continue salt soaks for another 24 hours and monitor.   Carlos American. [place], [facility] [phone]

## 2022-05-26 ENCOUNTER — Non-Acute Institutional Stay (SKILLED_NURSING_FACILITY): Payer: Medicare Other | Admitting: Student

## 2022-05-26 ENCOUNTER — Encounter: Payer: Self-pay | Admitting: Student

## 2022-05-26 DIAGNOSIS — D689 Coagulation defect, unspecified: Secondary | ICD-10-CM | POA: Diagnosis not present

## 2022-05-26 DIAGNOSIS — C50412 Malignant neoplasm of upper-outer quadrant of left female breast: Secondary | ICD-10-CM | POA: Diagnosis not present

## 2022-05-26 DIAGNOSIS — E1169 Type 2 diabetes mellitus with other specified complication: Secondary | ICD-10-CM

## 2022-05-26 DIAGNOSIS — I480 Paroxysmal atrial fibrillation: Secondary | ICD-10-CM | POA: Diagnosis not present

## 2022-05-26 DIAGNOSIS — I739 Peripheral vascular disease, unspecified: Secondary | ICD-10-CM

## 2022-05-26 NOTE — Progress Notes (Unsigned)
Location:  Other [facility].  Nursing Home Room Number: [facility]:  SNF [phone]) Provider:  Dewayne Shorter, MD  Patient Care Team: Dewayne Shorter, MD as PCP - General (Family Medicine) Bary Castilla Forest Gleason, MD (General Surgery) Lloyd Huger, MD as Consulting Physician (Oncology)  Extended Emergency Contact Information Primary Emergency Contact: Elwin Mocha Address: [address]          [place], [place] [address] Johnnette Litter of [place] Phone: [phone] Mobile Phone: [phone] Relation: Daughter  Code Status:  DNR Goals of care: Advanced Directive information    05/26/2022   11:08 AM  Advanced Directives  Does Patient Have a Medical Advance Directive? Yes  Type of Advance Directive Out of facility DNR (pink MOST or yellow form)  Does patient want to make changes to medical advance directive? No - Patient declined     Chief Complaint  Patient presents with   Medical Management of Chronic Issues    Medical Management of Chronic Issues.     HPI:  Pt is a 89 y.o. female seen today for medical management of chronic diseases.  Patient has concern because her POC glucose checks have been slighlty increased for the last few weeks. She denies any other concerns at this time or other sypmtoms.    Past Medical History:  Diagnosis Date   Anxiety    Benign breast lumps    Blood clotting disorder (HCC)    Bone cancer (HCC)    head of femur, left leg ... amputation at age 7   Breast cancer of upper-outer quadrant of left female breast ([place]) 07/27/2017   11 mm invasive mammary carcinoma, ER 90%, PR 51-90%, negative margins.  Oncotype recurrence score: 1.  DCIS, margin less than 0.5 mm.  Negative sentinel node.  Accelerated partial breast radiation.   Cervicalgia    Chronic kidney disease    kidney stones   Colon cancer [facility])    patient unaware of this   Complication of anesthesia    mood alteration / not sure if d/t pain medicine or  anesthesia as she passed out    Cough    NASAL DRIP / SNEEZING / SORE THROAT MOSTLY CONSTANT   Depression    Diabetes ([place])    Diverticulitis    Dizzy    GERD (gastroesophageal reflux disease)    H/O blood clots    arm and leg   HBP (high blood pressure)    Hematuria    gross   Hemorrhoids    HLD (hyperlipidemia)    Microscopic hematuria 06/02/2015   Muscle pain    Osteoarthritis    Osteoporosis    Panic disorder    Personal history of radiation therapy    PND (post-nasal drip)    CHRONIC WITH SORE THROAT AND SNEEZING   Reflux    Sepsis ([place])    Sepsis, unspecified organism ([place]) 12/15/2021   Formatting of this note might be different from the original.  Last Assessment & Plan: Formatting of this note might be different from the original. Appears to be multifactorial and secondary to healthcare associated pneumonia as well as a UTI CT angio shows asymmetric airspace disease at the left base and posteriorly in the right upper lobe concerning for pneumonia Patient also has pyuria Patient   Severe sepsis ([place]) 12/15/2021   Skin cancer    nose   Swelling    Tremor    Tremors of nervous system    Ureteral stone with  hydronephrosis 11/26/2018   Past Surgical History:  Procedure Laterality Date   APPENDECTOMY  1962   BREAST BIOPSY Right 2006?   benign   BREAST BIOPSY Left 2019   invasive mammary carcinoma   BREAST CYST EXCISION Left 07/27/2017   Procedure: SKIN CYST EXCISED;  Surgeon: Robert Bellow, MD;  Location: [facility] ORS;  Service: General;  Laterality: Left;   BREAST LUMPECTOMY Left 07/2017   invasive mammary, DCIS  mammosite   BREAST SURGERY Right 2019   CARPAL TUNNEL RELEASE Right    CATARACT EXTRACTION W/PHACO Left 11/11/2016   Procedure: CATARACT EXTRACTION PHACO AND INTRAOCULAR LENS PLACEMENT ([place]);  Surgeon: Birder Robson, MD;  Location: [facility] ORS;  Service: Ophthalmology;  Laterality: Left;  [place] 01:07.9 AP% 19.2 CDE 13.02 Fluid pack lot # [id number]    CATARACT EXTRACTION W/PHACO Right 12/09/2016   Procedure: CATARACT EXTRACTION PHACO AND INTRAOCULAR LENS PLACEMENT (IOC);  Surgeon: Birder Robson, MD;  Location: [facility] ORS;  Service: Ophthalmology;  Laterality: Right;  [place] 00:49 AP% 21.2 CDE 10.39 Fluid pack lot # [id number]   CHOLECYSTECTOMY     COLONOSCOPY WITH PROPOFOL N/A 11/22/2014   Procedure: COLONOSCOPY WITH PROPOFOL;  Surgeon: Manya Silvas, MD;  Location: [facility] ENDOSCOPY;  Service: Endoscopy;  Laterality: N/A;   CYSTOSCOPY WITH STENT PLACEMENT Bilateral 11/27/2018   Procedure: CYSTOSCOPY WITH STENT PLACEMENT;  Surgeon: Festus Aloe, MD;  Location: [facility] ORS;  Service: Urology;  Laterality: Bilateral;   CYSTOSCOPY/URETEROSCOPY/HOLMIUM LASER/STENT PLACEMENT Bilateral 12/17/2018   Procedure: CYSTOSCOPY/URETEROSCOPY/HOLMIUM LASER/STENT Exchange;  Surgeon: Billey Co, MD;  Location: [facility] ORS;  Service: Urology;  Laterality: Bilateral;   ESOPHAGOGASTRODUODENOSCOPY  11/22/2014   Procedure: ESOPHAGOGASTRODUODENOSCOPY (EGD);  Surgeon: Manya Silvas, MD;  Location: [facility] ENDOSCOPY;  Service: Endoscopy;;   ESOPHAGOGASTRODUODENOSCOPY (EGD) WITH PROPOFOL N/A 03/05/2017   Procedure: ESOPHAGOGASTRODUODENOSCOPY (EGD) WITH PROPOFOL;  Surgeon: Lin Landsman, MD;  Location: [facility] ENDOSCOPY;  Service: Gastroenterology;  Laterality: N/A;   EXTRACORPOREAL SHOCK WAVE LITHOTRIPSY Right 03/04/2018   Procedure: EXTRACORPOREAL SHOCK WAVE LITHOTRIPSY (ESWL);  Surgeon: Billey Co, MD;  Location: [facility] ORS;  Service: Urology;  Laterality: Right;   EYE SURGERY Bilateral 2012   cataract extraction with iol   GALLBLADDER SURGERY  1968   hemi pelvectomy     left side at age 72   HEMIPELVIC Left Lithium Left    AMPUTATION d/t cancer at 89 years old   MASTECTOMY, PARTIAL Left 07/27/2017   Procedure: MASTECTOMY PARTIAL;  Surgeon: Robert Bellow, MD;  Location: [facility] ORS;  Service: General;  Laterality: Left;   MOUTH SURGERY  2019    teeth pulled with a bridge insertion.   fell out 12/16/18   OVARY SURGERY Right    cyst removed   SAVORY DILATION  11/22/2014   Procedure: SAVORY DILATION;  Surgeon: Manya Silvas, MD;  Location[facility] ENDOSCOPY;  Service: Endoscopy;;   SENTINEL NODE BIOPSY Left 07/27/2017   Procedure: SENTINEL NODE BIOPSY;  Surgeon: Robert Bellow, MD;  Location: [facility] ORS;  Service: General;  Laterality: Left;   SKIN CANCER EXCISION     nose and arm and face   TEE WITHOUT CARDIOVERSION N/A 07/25/2019   Procedure: TRANSESOPHAGEAL ECHOCARDIOGRAM (TEE);  Surgeon: Corey Skains, MD;  Location: [facility] ORS;  Service: Cardiovascular;  Laterality: N/A;   TONSILLECTOMY      Allergies  Allergen Reactions   Ciprofloxacin Hives   Codeine Other (See Comments)    HALLUCINATIONS   Tape     Rash and  skin irritation/ paper tape and tegaderm OK   Ciprocinonide [Fluocinolone] Other (See Comments)    unknown   Amoxicillin Other (See Comments)    Upset stomach Has patient had a PCN reaction causing immediate rash, facial/tongue/throat swelling, SOB or lightheadedness with hypotension: No Has patient had a PCN reaction causing severe rash involving mucus membranes or skin necrosis: No Has patient had a PCN reaction that required hospitalization: No Has patient had a PCN reaction occurring within the last 10 years: Yes If all of the above answers are "NO", then may proceed with Cephalosporin use.;   Cefuroxime Other (See Comments)    OK INTRACAMERALLY PER DR WLP, upset stomach   Latex Rash    Rast test NEGATIVE   Lipitor [Atorvastatin] Other (See Comments)    unknown    Outpatient Encounter Medications as of 05/26/2022  Medication Sig   acetaminophen (TYLENOL) 650 MG CR tablet Take 650 mg by mouth every 8 (eight) hours as needed for pain.   Biotin 10000 MCG TABS Take 10,000 mcg by mouth at bedtime.   diclofenac Sodium (VOLTAREN) 1 % GEL Apply 4 g topically. Apply to stump topically every 6 hours as needed.  Apply to right knee topically three times daily for pain.   gabapentin (NEURONTIN) 100 MG capsule Take 100 mg by mouth 3 (three) times daily.   GLIPIZIDE XL 2.5 MG 24 hr tablet Take 2.5 mg by mouth daily.    hydrocortisone 2.5 % cream Apply to Hemorrhoids/buttucks topically every 8 hours as needed for hemorrhoids.   Infant Care Products (DERMACLOUD) OINT Apply to Sacrum topically every shift for prevention   letrozole (FEMARA) 2.5 MG tablet TAKE ONE TABLET BY MOUTH EVERY DAY   loratadine (CLARITIN) 10 MG tablet Take 10 mg by mouth daily.   melatonin 3 MG TABS tablet Take 3 mg by mouth at bedtime.   metFORMIN (GLUCOPHAGE) 500 MG tablet Take 500 mg by mouth daily with breakfast.   metoprolol tartrate (LOPRESSOR) 25 MG tablet Take 1 tablet (25 mg total) by mouth 3 (three) times daily.   Multiple Vitamin (MULTIVITAMIN WITH MINERALS) TABS tablet Take 1 tablet by mouth daily.   omeprazole (PRILOSEC) 40 MG capsule Take 40 mg by mouth daily.    ondansetron (ZOFRAN) 4 MG tablet Take 4 mg by mouth every 4 (four) hours as needed for nausea or vomiting.   OXYGEN 2lpm as needed for sats less than 88% every 1 hour as needed   PARoxetine (PAXIL) 30 MG tablet Take 30 mg by mouth daily.   Polyethyl Glycol-Propyl Glycol 0.4-0.3 % SOLN Apply to eye 2 (two) times daily as needed.   polyethylene glycol (MIRALAX / GLYCOLAX) 17 g packet Take 17 g by mouth daily as needed.   PRADAXA 150 MG CAPS capsule TAKE 1 CAPSULE BY MOUTH TWICE DAILY START TREATMENT 10 HRS LAST DOSE OF LOVENOX   tiZANidine (ZANAFLEX) 2 MG tablet Take 2 mg by mouth every 12 (twelve) hours as needed for muscle spasms.   [DISCONTINUED] acetaminophen (TYLENOL) 500 MG tablet Take 500 mg by mouth every 6 (six) hours as needed.   No facility-administered encounter medications on file as of 05/26/2022.    Review of Systems  All other systems reviewed and are negative.   Immunization History  Administered Date(s) Administered   Influenza-Unspecified  02/03/2020, 02/18/2021, 02/18/2022   Pneumococcal Conjugate-13 09/28/2017   Unspecified SARS-COV-2 Vaccination 05/20/2019, 06/17/2019, 03/16/2020, 09/20/2020, 01/25/2021   Pertinent  Health Maintenance Due  Topic Date Due   OPHTHALMOLOGY  EXAM  Never done   FOOT EXAM  11/16/2020   HEMOGLOBIN A1C  09/03/2022   INFLUENZA VACCINE  Completed   DEXA SCAN  Completed      12/18/2021    9:00 AM 12/18/2021    7:45 PM 12/19/2021    7:00 AM 12/19/2021   12:00 PM 01/24/2022   11:20 AM  Fall Risk  (RETIRED) Patient Fall Risk Level High fall risk High fall risk High fall risk High fall risk High fall risk   Functional Status Survey:    Vitals:   05/26/22 1054  BP: 136/65  Pulse: 61  Resp: 20  Temp: (!) 97.1 F (36.2 C)  SpO2: 90%  Weight: 162 lb (73.5 kg)  Height: '5\' 3"'$  (1.6 m)   Body mass index is 28.7 kg/m. Physical Exam Vitals reviewed.  Cardiovascular:     Rate and Rhythm: Normal rate.  Pulmonary:     Effort: Pulmonary effort is normal.  Abdominal:     General: Bowel sounds are normal.     Palpations: Abdomen is soft.  Musculoskeletal:     Comments: L hemipelvectomy. No swelling of RLE   Neurological:     Mental Status: She is alert and oriented to person, place, and time.     Labs reviewed: Recent Labs    12/17/21 0533 12/18/21 1207 12/19/21 0621 01/24/22 1051 01/24/22 1157 01/31/22 1053 03/05/22 0000  NA 136  --  139 143  --  137 141  K 3.5  --  3.6 4.7  --  4.8 4.3  CL 109  --  112* 107  --  110 103  CO2 20*  --  20* 31  --  23 29*  GLUCOSE 199*  --  243* 198*  --  236*  --   BUN 28*  --  21 22  --  22 18  CREATININE 0.61  --  0.53 0.69  --  0.74 0.5  CALCIUM 9.5  --  9.4 11.8* 11.9* 9.7 10.7  MG 1.5* 1.8  --   --   --   --   --    Recent Labs    12/15/21 1011 12/17/21 0533 03/05/22 0000  AST 33 25 18  ALT '17 26 10  '$ ALKPHOS 43 45 65  BILITOT 0.5 0.5  --   PROT 6.8 6.0*  --   ALBUMIN 3.3* 2.7* 4.0   Recent Labs    12/15/21 1545 12/16/21 0529  12/17/21 0533 01/24/22 1051 03/05/22 0000  WBC 15.3* 12.3* 8.6 9.3 8.6  NEUTROABS 13.0*  --   --  5.9 5,616.00  HGB 10.9* 10.5* 10.9* 12.2 13.1  HCT 36.4 34.8* 36.4 41.0 42  MCV 82.0 81.7 81.3 86.0  --   PLT 156 127* 125* 218 205   Lab Results  Component Value Date   TSH 0.11 (A) 04/07/2022   Lab Results  Component Value Date   HGBA1C 7.4 03/05/2022   Lab Results  Component Value Date   CHOL 110 12/15/2021   HDL 19 (L) 12/15/2021   LDLCALC 46 12/15/2021   TRIG 223 (H) 12/15/2021   CHOLHDL 5.8 12/15/2021    Significant Diagnostic Results in last 30 days:  No results found.  Assessment/Plan 1. PAD (peripheral artery disease) (HCC) Palpable pulses distally. \  2. Paroxysmal atrial fibrillation (HCC) Rate well-controlled currently. Continue metoprolol   3. Coagulation disorder ([place]) No recent clods or signs of clots. Continue pradaxa.   4. Primary cancer of upper outer quadrant of left  female breast ([place]) Continue Letrazole.   5. Type 2 diabetes mellitus with other specified complication, without long-term current use of insulin (HCC) Glucose uptrending in the last few months. Plan to increase metformin to '750mg'$  BID. And Glipizide 2.5 BID. Will determine need for additional medication changes based on tolerance of increased dose of metformin.    Family/ staff Communication: Nursing  Labs/tests ordered:  CBC, BMP, A1c

## 2022-05-29 LAB — HEMOGLOBIN A1C: Hemoglobin A1C: 7.4

## 2022-06-05 ENCOUNTER — Encounter: Payer: Self-pay | Admitting: Nurse Practitioner

## 2022-06-05 ENCOUNTER — Non-Acute Institutional Stay (SKILLED_NURSING_FACILITY): Payer: Medicare Other | Admitting: Nurse Practitioner

## 2022-06-05 DIAGNOSIS — I739 Peripheral vascular disease, unspecified: Secondary | ICD-10-CM | POA: Diagnosis not present

## 2022-06-05 DIAGNOSIS — S90821A Blister (nonthermal), right foot, initial encounter: Secondary | ICD-10-CM | POA: Diagnosis not present

## 2022-06-05 DIAGNOSIS — S9031XD Contusion of right foot, subsequent encounter: Secondary | ICD-10-CM | POA: Diagnosis not present

## 2022-06-05 NOTE — Progress Notes (Signed)
Location:   [facility] Room Number: [phone] Place of Service:  SNF [phone]) Provider:  Sherrie Mustache, NP  Dewayne Shorter, MD  Patient Care Team: Dewayne Shorter, MD as PCP - General (Family Medicine) Bary Castilla Forest Gleason, MD (General Surgery) Lloyd Huger, MD as Consulting Physician (Oncology)  Extended Emergency Contact Information Primary Emergency Contact: Elwin Mocha Address: [address]          [place], [place] [address] [place] of [place] Phone: [phone] Mobile Phone: [phone] Relation: Daughter  Code Status:  DNR Goals of care: Advanced Directive information    06/05/2022   12:15 PM  Advanced Directives  Does Patient Have a Medical Advance Directive? Yes  Type of Advance Directive Out of facility DNR (pink MOST or yellow form)  Does patient want to make changes to medical advance directive? No - Patient declined  Pre-existing out of facility DNR order (yellow form or pink MOST form) Yellow form placed in chart (order not valid for inpatient use)     Chief Complaint  Patient presents with   Acute Visit    Foot injury now has blisters    HPI:  Pt is a 89 y.o. female seen today at the request of nursing. Pt hit her foot on the door frame and [facility], fracture was ruled out. Recommended ice for swelling. She has been icing foot throughout the day. Reports pain to foot is well controlled with tylenol.  Nursing noted blistered on the top of the foot today. No increase in pain, redness or heat.  Pt without fever or chills.    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  Oncotype recurrence score: 1.  DCIS,  margin less than 0.5 mm.  Negative sentinel node.  Accelerated partial breast radiation.   Cervicalgia    Chronic kidney disease    kidney stones   Colon cancer [facility])    patient unaware of this   Complication of anesthesia    mood alteration / not sure if d/t pain medicine or anesthesia as she passed out    Cough    NASAL DRIP / SNEEZING / SORE THROAT MOSTLY CONSTANT   Depression    Diabetes ([place])    Diverticulitis    Dizzy    GERD (gastroesophageal reflux disease)    H/O blood clots    arm and leg   HBP (high blood pressure)    Hematuria    gross   Hemorrhoids    HLD (hyperlipidemia)    Microscopic hematuria 06/02/2015   Muscle pain    Osteoarthritis    Osteoporosis    Panic disorder    Personal history of radiation therapy    PND (post-nasal drip)    CHRONIC WITH SORE THROAT AND SNEEZING   Reflux    Sepsis ([place])    Sepsis, unspecified organism ([place]) 12/15/2021   Formatting of this note might be different from the original.  Last Assessment & Plan: Formatting of this note might be different from the original. Appears to be multifactorial and secondary to healthcare associated pneumonia as well as a UTI CT angio shows  asymmetric airspace disease at the left base and posteriorly in the right upper lobe concerning for pneumonia Patient also has pyuria Patient   Severe sepsis ([place]) 12/15/2021   Skin cancer    nose   Swelling    Tremor    Tremors of nervous system    Ureteral stone with hydronephrosis 11/26/2018   Past Surgical History:  Procedure Laterality Date   APPENDECTOMY  1962   BREAST BIOPSY Right 2006?   benign   BREAST BIOPSY Left 2019   invasive mammary carcinoma   BREAST CYST EXCISION Left 07/27/2017   Procedure: SKIN CYST EXCISED;  Surgeon: Robert Bellow, MD;  Location: [facility] ORS;  Service: General;  Laterality: Left;   BREAST LUMPECTOMY Left 07/2017   invasive mammary, DCIS  mammosite   BREAST SURGERY Right 2019   CARPAL TUNNEL RELEASE Right     CATARACT EXTRACTION W/PHACO Left 11/11/2016   Procedure: CATARACT EXTRACTION PHACO AND INTRAOCULAR LENS PLACEMENT ([place]);  Surgeon: Birder Robson, MD;  Location: [facility] ORS;  Service: Ophthalmology;  Laterality: Left;  [place] 01:07.9 AP% 19.2 CDE 13.02 Fluid pack lot # [id number]   CATARACT EXTRACTION W/PHACO Right 12/09/2016   Procedure: CATARACT EXTRACTION PHACO AND INTRAOCULAR LENS PLACEMENT (I[facility]);  Surgeon: Birder Robson, MD;  Location: [facility] ORS;  Service: Ophthalmology;  Laterality: Right;  [place] 00:49 AP% 21.2 CDE 10.39 Fluid pack lot # [id number]   CHOLECYSTECTOMY     COLONOSCOPY WITH PROPOFOL N/A 11/22/2014   Procedure: COLONOSCOPY WITH PROPOFOL;  Surgeon: Manya Silvas, MD;  Location: [facility] ENDOSCOPY;  Service: Endoscopy;  Laterality: N/A;   CYSTOSCOPY WITH STENT PLACEMENT Bilateral 11/27/2018   Procedure: CYSTOSCOPY WITH STENT PLACEMENT;  Surgeon: Festus Aloe, MD;  Location: [facility] ORS;  Service: Urology;  Laterality: Bilateral;   CYSTOSCOPY/URETEROSCOPY/HOLMIUM LASER/STENT PLACEMENT Bilateral 12/17/2018   Procedure: CYSTOSCOPY/URETEROSCOPY/HOLMIUM LASER/STENT Exchange;  Surgeon: Billey Co, MD;  Location: [facility] ORS;  Service: Urology;  Laterality: Bilateral;   ESOPHAGOGASTRODUODENOSCOPY  11/22/2014   Procedure: ESOPHAGOGASTRODUODENOSCOPY (EGD);  Surgeon: Manya Silvas, MD;  Location: [facility] ENDOSCOPY;  Service: Endoscopy;;   ESOPHAGOGASTRODUODENOSCOPY (EGD) WITH PROPOFOL N/A 03/05/2017   Procedure: ESOPHAGOGASTRODUODENOSCOPY (EGD) WITH PROPOFOL;  Surgeon: Lin Landsman, MD;  Location: [facility] ENDOSCOPY;  Service: Gastroenterology;  Laterality: N/A;   EXTRACORPOREAL SHOCK WAVE LITHOTRIPSY Right 03/04/2018   Procedure: EXTRACORPOREAL SHOCK WAVE LITHOTRIPSY (ESWL);  Surgeon: Billey Co, MD;  Location: [facility] ORS;  Service: Urology;  Laterality: Right;   EYE SURGERY Bilateral 2012   cataract extraction with iol   GALLBLADDER SURGERY  1968   hemi pelvectomy     left side at age  16   HEMIPELVIC Left [place] Left    AMPUTATION d/t cancer at 89 years old   MASTECTOMY, PARTIAL Left 07/27/2017   Procedure: MASTECTOMY PARTIAL;  Surgeon: Robert Bellow, MD;  Location: [facility] ORS;  Service: General;  Laterality: Left;   MOUTH SURGERY  2019   teeth pulled with a bridge insertion.   fell out 12/16/18   OVARY SURGERY Right    cyst removed   SAVORY DILATION  11/22/2014   Procedure: SAVORY DILATION;  Surgeon: Manya Silvas, MD;  Location: [facility] ENDOSCOPY;  Service: Endoscopy;;   SENTINEL NODE BIOPSY Left 07/27/2017   Procedure: SENTINEL NODE BIOPSY;  Surgeon: Robert Bellow, MD;  Location: [facility] ORS;  Service: General;  Laterality: Left;   SKIN CANCER EXCISION     nose and arm and face   TEE WITHOUT CARDIOVERSION N/A 07/25/2019  Procedure: TRANSESOPHAGEAL ECHOCARDIOGRAM (TEE);  Surgeon: Corey Skains, MD;  Location: [facility] ORS;  Service: Cardiovascular;  Laterality: N/A;   TONSILLECTOMY      Allergies  Allergen Reactions   Ciprofloxacin Hives   Codeine Other (See Comments)    HALLUCINATIONS   Tape     Rash and skin irritation/ paper tape and tegaderm OK   Ciprocinonide [Fluocinolone] Other (See Comments)    unknown   Amoxicillin Other (See Comments)    Upset stomach Has patient had a PCN reaction causing immediate rash, facial/tongue/throat swelling, SOB or lightheadedness with hypotension: No Has patient had a PCN reaction causing severe rash involving mucus membranes or skin necrosis: No Has patient had a PCN reaction that required hospitalization: No Has patient had a PCN reaction occurring within the last 10 years: Yes If all of the above answers are "NO", then may proceed with Cephalosporin use.;   Cefuroxime Other (See Comments)    OK INTRACAMERALLY PER DR WLP, upset stomach   Latex Rash    Rast test NEGATIVE   Lipitor [Atorvastatin] Other (See Comments)    unknown    Allergies as of 06/05/2022       Reactions   Ciprofloxacin Hives    Codeine Other (See Comments)   HALLUCINATIONS   Tape    Rash and skin irritation/ paper tape and tegaderm OK   Ciprocinonide [fluocinolone] Other (See Comments)   unknown   Amoxicillin Other (See Comments)   Upset stomach Has patient had a PCN reaction causing immediate rash, facial/tongue/throat swelling, SOB or lightheadedness with hypotension: No Has patient had a PCN reaction causing severe rash involving mucus membranes or skin necrosis: No Has patient had a PCN reaction that required hospitalization: No Has patient had a PCN reaction occurring within the last 10 years: Yes If all of the above answers are "NO", then may proceed with Cephalosporin use.;   Cefuroxime Other (See Comments)   OK INTRACAMERALLY PER DR WLP, upset stomach   Latex Rash   Rast test NEGATIVE   Lipitor [atorvastatin] Other (See Comments)   unknown        Medication List        Accurate as of June 05, 2022 12:23 PM. If you have any questions, ask your nurse or doctor.          acetaminophen 650 MG CR tablet Commonly known as: TYLENOL Take 650 mg by mouth every 8 (eight) hours as needed for pain.   acetaminophen 500 MG tablet Commonly known as: TYLENOL Take 500 mg by mouth every 6 (six) hours as needed.   Biotin 10000 MCG Tabs Take 10,000 mcg by mouth at bedtime.   Dermacloud Oint Apply to Sacrum topically every shift for prevention   diclofenac Sodium 1 % Gel Commonly known as: VOLTAREN Apply 4 g topically. Apply to stump topically every 6 hours as needed. Apply to right knee topically three times daily for pain.   gabapentin 100 MG capsule Commonly known as: NEURONTIN Take 100 mg by mouth 3 (three) times daily.   glipiZIDE XL 2.5 MG 24 hr tablet Generic drug: glipiZIDE Take 2.5 mg by mouth daily.   hydrocortisone 2.5 % cream Apply to Hemorrhoids/buttucks topically every 8 hours as needed for hemorrhoids.   letrozole 2.5 MG tablet Commonly known as: FEMARA TAKE ONE TABLET BY  MOUTH EVERY DAY   loratadine 10 MG tablet Commonly known as: CLARITIN Take 10 mg by mouth daily.   melatonin 3 MG Tabs tablet Take 3  mg by mouth at bedtime.   metFORMIN 500 MG tablet Commonly known as: GLUCOPHAGE Take 500 mg by mouth daily with breakfast.   metoprolol tartrate 25 MG tablet Commonly known as: LOPRESSOR Take 1 tablet (25 mg total) by mouth 3 (three) times daily.   multivitamin with minerals Tabs tablet Take 1 tablet by mouth daily.   omeprazole 40 MG capsule Commonly known as: PRILOSEC Take 40 mg by mouth daily.   ondansetron 4 MG tablet Commonly known as: ZOFRAN Take 4 mg by mouth every 4 (four) hours as needed for nausea or vomiting.   OXYGEN 2lpm as needed for sats less than 88% every 1 hour as needed   PARoxetine 30 MG tablet Commonly known as: PAXIL Take 30 mg by mouth daily.   Polyethyl Glycol-Propyl Glycol 0.4-0.3 % Soln Apply to eye 2 (two) times daily as needed.   polyethylene glycol 17 g packet Commonly known as: MIRALAX / GLYCOLAX Take 17 g by mouth daily as needed.   Pradaxa 150 MG Caps capsule Generic drug: dabigatran TAKE 1 CAPSULE BY MOUTH TWICE DAILY START TREATMENT 10 HRS LAST DOSE OF LOVENOX   tiZANidine 2 MG tablet Commonly known as: ZANAFLEX Take 2 mg by mouth every 12 (twelve) hours as needed for muscle spasms.        Review of Systems  Constitutional:  Negative for activity change.  Musculoskeletal:  Positive for arthralgias, gait problem and joint swelling.  Skin:  Positive for color change. Negative for rash.    Immunization History  Administered Date(s) Administered   Influenza-Unspecified 02/03/2020, 02/18/2021, 02/18/2022   Pneumococcal Conjugate-13 09/28/2017   Unspecified SARS-COV-2 Vaccination 05/20/2019, 06/17/2019, 03/16/2020, 09/20/2020, 01/25/2021   Pertinent  Health Maintenance Due  Topic Date Due   OPHTHALMOLOGY EXAM  Never done   FOOT EXAM  11/16/2020   HEMOGLOBIN A1C  11/27/2022   INFLUENZA  VACCINE  Completed   DEXA SCAN  Completed      12/18/2021    9:00 AM 12/18/2021    7:45 PM 12/19/2021    7:00 AM 12/19/2021   12:00 PM 01/24/2022   11:20 AM  Fall Risk  (RETIRED) Patient Fall Risk Level High fall risk High fall risk High fall risk High fall risk High fall risk   Functional Status Survey:    Vitals:   06/05/22 1213  BP: (!) 135/58  Pulse: 70  Resp: 20  Temp: (!) 97.1 F (36.2 C)  SpO2: 93%  Weight: 160 lb 9.6 oz (72.8 kg)  Height: '5\' 3"'$  (1.6 m)   Body mass index is 28.45 kg/m. Physical Exam Constitutional:      Appearance: Normal appearance.  Pulmonary:     Effort: Pulmonary effort is normal.  Feet:     Right foot:     Skin integrity: Blister (2 blisters noted to base of great toe and 3/4 toe, clear fluid without erythema noted. eccymosis noted to toes and foot) present.  Skin:    General: Skin is warm.  Neurological:     Mental Status: She is alert. Mental status is at baseline.  Psychiatric:        Mood and Affect: Mood normal.     Labs reviewed: Recent Labs    12/17/21 0533 12/18/21 1207 12/19/21 0621 01/24/22 1051 01/24/22 1157 01/31/22 1053 03/05/22 0000  NA 136  --  139 143  --  137 141  K 3.5  --  3.6 4.7  --  4.8 4.3  CL 109  --  112* 107  --  110 103  CO2 20*  --  20* 31  --  23 29*  GLUCOSE 199*  --  243* 198*  --  236*  --   BUN 28*  --  21 22  --  22 18  CREATININE 0.61  --  0.53 0.69  --  0.74 0.5  CALCIUM 9.5  --  9.4 11.8* 11.9* 9.7 10.7  MG 1.5* 1.8  --   --   --   --   --    Recent Labs    12/15/21 1011 12/17/21 0533 03/05/22 0000  AST 33 25 18  ALT '17 26 10  '$ ALKPHOS 43 45 65  BILITOT 0.5 0.5  --   PROT 6.8 6.0*  --   ALBUMIN 3.3* 2.7* 4.0   Recent Labs    12/15/21 1545 12/16/21 0529 12/17/21 0533 01/24/22 1051 03/05/22 0000  WBC 15.3* 12.3* 8.6 9.3 8.6  NEUTROABS 13.0*  --   --  5.9 5,616.00  HGB 10.9* 10.5* 10.9* 12.2 13.1  HCT 36.4 34.8* 36.4 41.0 42  MCV 82.0 81.7 81.3 86.0  --   PLT 156 127*  125* 218 205   Lab Results  Component Value Date   TSH 0.11 (A) 04/07/2022   Lab Results  Component Value Date   HGBA1C 7.4 05/29/2022   Lab Results  Component Value Date   CHOL 110 12/15/2021   HDL 19 (L) 12/15/2021   LDLCALC 46 12/15/2021   TRIG 223 (H) 12/15/2021   CHOLHDL 5.8 12/15/2021    Significant Diagnostic Results in last 30 days:  No results found.  Assessment/Plan 1. Blister of right foot, initial encounter Noted to foot at base of toes- due to swelling from contusion, no signs of infection at this time. Not to pop or drain. Nursing to closely monitor for skin breakdown and signs of infection or increase in pain.   2. PAD (peripheral artery disease) ([place]) - continues on pradaxa  3. Contusion of right foot, subsequent encounter -continues to have swelling and bruising, pain is well controlled with tylenol.  Continue to keep foot/leg elevated and monitor.    Carlos American. [place], [facility] [phone]

## 2022-06-25 ENCOUNTER — Non-Acute Institutional Stay (SKILLED_NURSING_FACILITY): Payer: Medicare Other | Admitting: Student

## 2022-06-25 ENCOUNTER — Encounter: Payer: Self-pay | Admitting: Student

## 2022-06-25 DIAGNOSIS — M810 Age-related osteoporosis without current pathological fracture: Secondary | ICD-10-CM

## 2022-06-25 DIAGNOSIS — F341 Dysthymic disorder: Secondary | ICD-10-CM

## 2022-06-25 DIAGNOSIS — S88912S Complete traumatic amputation of left lower leg, level unspecified, sequela: Secondary | ICD-10-CM | POA: Diagnosis not present

## 2022-06-25 DIAGNOSIS — I10 Essential (primary) hypertension: Secondary | ICD-10-CM

## 2022-06-25 DIAGNOSIS — I739 Peripheral vascular disease, unspecified: Secondary | ICD-10-CM | POA: Diagnosis not present

## 2022-06-25 DIAGNOSIS — C50412 Malignant neoplasm of upper-outer quadrant of left female breast: Secondary | ICD-10-CM

## 2022-06-25 DIAGNOSIS — L539 Erythematous condition, unspecified: Secondary | ICD-10-CM

## 2022-06-25 DIAGNOSIS — E1169 Type 2 diabetes mellitus with other specified complication: Secondary | ICD-10-CM

## 2022-06-25 DIAGNOSIS — E0590 Thyrotoxicosis, unspecified without thyrotoxic crisis or storm: Secondary | ICD-10-CM

## 2022-06-25 NOTE — Progress Notes (Signed)
Location:  Other [place].  Nursing Home Room Number: [place] of Service:  SNF [phone]) Provider:  Dewayne Shorter, MD  Patient Care Team: Dewayne Shorter, MD as PCP - General (Family Medicine) Bary Castilla Forest Gleason, MD (General Surgery) Lloyd Huger, MD as Consulting Physician (Oncology)  Extended Emergency Contact Information Primary Emergency Contact: Elwin Mocha Address: [address]          [place], [place] [address] Johnnette Litter of [place] Phone: [phone] Mobile Phone: [phone] Relation: Daughter  Code Status:  DNR Goals of care: Advanced Directive information    06/25/2022   11:34 AM  Advanced Directives  Does Patient Have a Medical Advance Directive? Yes  Type of Advance Directive Out of facility DNR (pink MOST or yellow form)  Does patient want to make changes to medical advance directive? No - Patient declined     Chief Complaint  Patient presents with   Acute Visit    Wound    HPI:  Pt is a 89 y.o. female seen today for an acute and routine visit for wound on left hemi pelvic side. Nursing state they were able to probe to 2 cm.   Discussed with patient concern for fistula. Given anatomy unable to accurately visualize with an ultrasound.    Past Medical History:  Diagnosis Date   Anxiety    Benign breast lumps    Blood clotting disorder (HCC)    Bone cancer (HCC)    head of femur, left leg ... amputation at age 84   Breast cancer of upper-outer quadrant of left female breast ([place]) 07/27/2017   11 mm invasive mammary carcinoma, ER 90%, PR 51-90%, negative margins.  Oncotype recurrence score: 1.  DCIS, margin less than 0.5 mm.  Negative sentinel node.  Accelerated partial breast radiation.   Cervicalgia    Chronic kidney disease    kidney stones   Colon cancer [facility])    patient unaware of this   Complication of anesthesia    mood alteration / not sure if d/t pain medicine or anesthesia as she passed out    Cough     NASAL DRIP / SNEEZING / SORE THROAT MOSTLY CONSTANT   Depression    Diabetes ([place])    Diverticulitis    Dizzy    GERD (gastroesophageal reflux disease)    H/O blood clots    arm and leg   HBP (high blood pressure)    Hematuria    gross   Hemorrhoids    HLD (hyperlipidemia)    Microscopic hematuria 06/02/2015   Muscle pain    Osteoarthritis    Osteoporosis    Panic disorder    Personal history of radiation therapy    PND (post-nasal drip)    CHRONIC WITH SORE THROAT AND SNEEZING   Reflux    Sepsis ([place])    Sepsis, unspecified organism ([place]) 12/15/2021   Formatting of this note might be different from the original.  Last Assessment & Plan: Formatting of this note might be different from the original. Appears to be multifactorial and secondary to healthcare associated pneumonia as well as a UTI CT angio shows asymmetric airspace disease at the left base and posteriorly in the right upper lobe concerning for pneumonia Patient also has pyuria Patient   Severe sepsis ([place]) 12/15/2021   Skin cancer    nose   Swelling    Tremor    Tremors of nervous system    Ureteral stone with hydronephrosis 11/26/2018  Past Surgical History:  Procedure Laterality Date   APPENDECTOMY  1962   BREAST BIOPSY Right 2006?   benign   BREAST BIOPSY Left 2019   invasive mammary carcinoma   BREAST CYST EXCISION Left 07/27/2017   Procedure: SKIN CYST EXCISED;  Surgeon: Robert Bellow, MD;  Location: [facility] ORS;  Service: General;  Laterality: Left;   BREAST LUMPECTOMY Left 07/2017   invasive mammary, DCIS  mammosite   BREAST SURGERY Right 2019   CARPAL TUNNEL RELEASE Right    CATARACT EXTRACTION W/PHACO Left 11/11/2016   Procedure: CATARACT EXTRACTION PHACO AND INTRAOCULAR LENS PLACEMENT ([place]);  Surgeon: Birder Robson, MD;  Location: [facility] ORS;  Service: Ophthalmology;  Laterality: Left;  [place] 01:07.9 AP% 19.2 CDE 13.02 Fluid pack lot # [id number]   CATARACT EXTRACTION W/PHACO Right 12/09/2016    Procedure: CATARACT EXTRACTION PHACO AND INTRAOCULAR LENS PLACEMENT (IOC);  Surgeon: Birder Robson, MD;  Location: [facility] ORS;  Service: Ophthalmology;  Laterality: Right;  [place] 00:49 AP% 21.2 CDE 10.39 Fluid pack lot # [id number]   CHOLECYSTECTOMY     COLONOSCOPY WITH PROPOFOL N/A 11/22/2014   Procedure: COLONOSCOPY WITH PROPOFOL;  Surgeon: Manya Silvas, MD;  Location: [facility] ENDOSCOPY;  Service: Endoscopy;  Laterality: N/A;   CYSTOSCOPY WITH STENT PLACEMENT Bilateral 11/27/2018   Procedure: CYSTOSCOPY WITH STENT PLACEMENT;  Surgeon: Festus Aloe, MD;  Location: [facility] ORS;  Service: Urology;  Laterality: Bilateral;   CYSTOSCOPY/URETEROSCOPY/HOLMIUM LASER/STENT PLACEMENT Bilateral 12/17/2018   Procedure: CYSTOSCOPY/URETEROSCOPY/HOLMIUM LASER/STENT Exchange;  Surgeon: Billey Co, MD;  Location: [facility] ORS;  Service: Urology;  Laterality: Bilateral;   ESOPHAGOGASTRODUODENOSCOPY  11/22/2014   Procedure: ESOPHAGOGASTRODUODENOSCOPY (EGD);  Surgeon: Manya Silvas, MD;  Location: [facility] ENDOSCOPY;  Service: Endoscopy;;   ESOPHAGOGASTRODUODENOSCOPY (EGD) WITH PROPOFOL N/A 03/05/2017   Procedure: ESOPHAGOGASTRODUODENOSCOPY (EGD) WITH PROPOFOL;  Surgeon: Lin Landsman, MD;  Location: [facility] ENDOSCOPY;  Service: Gastroenterology;  Laterality: N/A;   EXTRACORPOREAL SHOCK WAVE LITHOTRIPSY Right 03/04/2018   Procedure: EXTRACORPOREAL SHOCK WAVE LITHOTRIPSY (ESWL);  Surgeon: Billey Co, MD;  Location: [facility] ORS;  Service: Urology;  Laterality: Right;   EYE SURGERY Bilateral 2012   cataract extraction with iol   GALLBLADDER SURGERY  1968   hemi pelvectomy     left side at age 47   HEMIPELVIC Left [place] Left    AMPUTATION d/t cancer at 89 years old   MASTECTOMY, PARTIAL Left 07/27/2017   Procedure: MASTECTOMY PARTIAL;  Surgeon: Robert Bellow, MD;  Location: [facility] ORS;  Service: General;  Laterality: Left;   MOUTH SURGERY  2019   teeth pulled with a bridge insertion.   fell out  12/16/18   OVARY SURGERY Right    cyst removed   SAVORY DILATION  11/22/2014   Procedure: SAVORY DILATION;  Surgeon: Manya Silvas, MD;  Location: [facility] ENDOSCOPY;  Service: Endoscopy;;   SENTINEL NODE BIOPSY Left 07/27/2017   Procedure: SENTINEL NODE BIOPSY;  Surgeon: Robert Bellow, MD;  Location: [facility] ORS;  Service: General;  Laterality: Left;   SKIN CANCER EXCISION     nose and arm and face   TEE WITHOUT CARDIOVERSION N/A 07/25/2019   Procedure: TRANSESOPHAGEAL ECHOCARDIOGRAM (TEE);  Surgeon: Corey Skains, MD;  Location: [facility] ORS;  Service: Cardiovascular;  Laterality: N/A;   TONSILLECTOMY      Allergies  Allergen Reactions   Ciprofloxacin Hives   Codeine Other (See Comments)    HALLUCINATIONS   Tape     Rash and skin irritation/ paper tape  and tegaderm OK   Ciprocinonide [Fluocinolone] Other (See Comments)    unknown   Amoxicillin Other (See Comments)    Upset stomach Has patient had a PCN reaction causing immediate rash, facial/tongue/throat swelling, SOB or lightheadedness with hypotension: No Has patient had a PCN reaction causing severe rash involving mucus membranes or skin necrosis: No Has patient had a PCN reaction that required hospitalization: No Has patient had a PCN reaction occurring within the last 10 years: Yes If all of the above answers are "NO", then may proceed with Cephalosporin use.;   Cefuroxime Other (See Comments)    OK INTRACAMERALLY PER DR WLP, upset stomach   Latex Rash    Rast test NEGATIVE   Lipitor [Atorvastatin] Other (See Comments)    unknown    Outpatient Encounter Medications as of 06/25/2022  Medication Sig   acetaminophen (TYLENOL) 500 MG tablet Take 500 mg by mouth every 6 (six) hours as needed.   acetaminophen (TYLENOL) 650 MG CR tablet Take 650 mg by mouth every 8 (eight) hours as needed for pain.   Biotin 10000 MCG TABS Take 10,000 mcg by mouth at bedtime.   dextromethorphan-guaiFENesin (ROBITUSSIN-DM) 10-100 MG/5ML liquid  Take 10 mLs by mouth every 4 (four) hours as needed for cough.   diclofenac Sodium (VOLTAREN) 1 % GEL Apply 4 g topically. Apply to stump topically every 6 hours as needed. Apply to right knee topically three times daily for pain.   gabapentin (NEURONTIN) 100 MG capsule Take 100 mg by mouth 3 (three) times daily.   GLIPIZIDE XL 2.5 MG 24 hr tablet Take 2.5 mg by mouth daily.    hydrocortisone 2.5 % cream Apply to Hemorrhoids/buttucks topically every 8 hours as needed for hemorrhoids.   Infant Care Products (DERMACLOUD) OINT Apply to Sacrum topically every shift for prevention   letrozole (FEMARA) 2.5 MG tablet TAKE ONE TABLET BY MOUTH EVERY DAY   loratadine (CLARITIN) 10 MG tablet Take 10 mg by mouth daily.   melatonin 3 MG TABS tablet Take 3 mg by mouth at bedtime.   metFORMIN (GLUCOPHAGE) 500 MG tablet Take 750 mg by mouth daily with breakfast.   metoprolol tartrate (LOPRESSOR) 25 MG tablet Take 1 tablet (25 mg total) by mouth 3 (three) times daily.   Multiple Vitamin (MULTIVITAMIN WITH MINERALS) TABS tablet Take 1 tablet by mouth daily.   omeprazole (PRILOSEC) 40 MG capsule Take 40 mg by mouth daily.    ondansetron (ZOFRAN) 4 MG tablet Take 4 mg by mouth every 4 (four) hours as needed for nausea or vomiting.   OXYGEN 2lpm as needed for sats less than 88% every 1 hour as needed   PARoxetine (PAXIL) 30 MG tablet Take 30 mg by mouth daily.   Polyethyl Glycol-Propyl Glycol 0.4-0.3 % SOLN Apply to eye 2 (two) times daily as needed.   polyethylene glycol (MIRALAX / GLYCOLAX) 17 g packet Take 17 g by mouth daily as needed.   PRADAXA 150 MG CAPS capsule TAKE 1 CAPSULE BY MOUTH TWICE DAILY START TREATMENT 10 HRS LAST DOSE OF LOVENOX   tiZANidine (ZANAFLEX) 2 MG tablet Take 2 mg by mouth every 12 (twelve) hours as needed for muscle spasms.   [DISCONTINUED] metFORMIN (GLUCOPHAGE) 500 MG tablet Take 500 mg by mouth daily with breakfast.   No facility-administered encounter medications on file as of  06/25/2022.    Review of Systems  Immunization History  Administered Date(s) Administered   Influenza-Unspecified 02/03/2020, 02/18/2021, 02/18/2022   Pneumococcal Conjugate-13 09/28/2017  Unspecified SARS-COV-2 Vaccination 05/20/2019, 06/17/2019, 03/16/2020, 09/20/2020, 01/25/2021   Pertinent  Health Maintenance Due  Topic Date Due   OPHTHALMOLOGY EXAM  Never done   FOOT EXAM  11/16/2020   HEMOGLOBIN A1C  11/27/2022   INFLUENZA VACCINE  Completed   DEXA SCAN  Completed      12/18/2021    9:00 AM 12/18/2021    7:45 PM 12/19/2021    7:00 AM 12/19/2021   12:00 PM 01/24/2022   11:20 AM  Fall Risk  (RETIRED) Patient Fall Risk Level High fall risk High fall risk High fall risk High fall risk High fall risk   Functional Status Survey:    Vitals:   06/25/22 1126  BP: (!) 126/58  Pulse: 63  Resp: 20  Temp: (!) 97.1 F (36.2 C)  SpO2: 91%  Weight: 160 lb 9.6 oz (72.8 kg)  Height: 5' 3"$  (1.6 m)   Body mass index is 28.45 kg/m. Physical Exam Vitals reviewed.  Constitutional:      Appearance: Normal appearance.  Neurological:     Mental Status: She is alert.     Labs reviewed: Recent Labs    12/17/21 0533 12/18/21 1207 12/19/21 0621 01/24/22 1051 01/24/22 1157 01/31/22 1053 03/05/22 0000  NA 136  --  139 143  --  137 141  K 3.5  --  3.6 4.7  --  4.8 4.3  CL 109  --  112* 107  --  110 103  CO2 20*  --  20* 31  --  23 29*  GLUCOSE 199*  --  243* 198*  --  236*  --   BUN 28*  --  21 22  --  22 18  CREATININE 0.61  --  0.53 0.69  --  0.74 0.5  CALCIUM 9.5  --  9.4 11.8* 11.9* 9.7 10.7  MG 1.5* 1.8  --   --   --   --   --    Recent Labs    12/15/21 1011 12/17/21 0533 03/05/22 0000  AST 33 25 18  ALT 17 26 10  $ ALKPHOS 43 45 65  BILITOT 0.5 0.5  --   PROT 6.8 6.0*  --   ALBUMIN 3.3* 2.7* 4.0   Recent Labs    12/15/21 1545 12/16/21 0529 12/17/21 0533 01/24/22 1051 03/05/22 0000  WBC 15.3* 12.3* 8.6 9.3 8.6  NEUTROABS 13.0*  --   --  5.9 5,616.00   HGB 10.9* 10.5* 10.9* 12.2 13.1  HCT 36.4 34.8* 36.4 41.0 42  MCV 82.0 81.7 81.3 86.0  --   PLT 156 127* 125* 218 205   Lab Results  Component Value Date   TSH 0.11 (A) 04/07/2022   Lab Results  Component Value Date   HGBA1C 7.4 05/29/2022   Lab Results  Component Value Date   CHOL 110 12/15/2021   HDL 19 (L) 12/15/2021   LDLCALC 46 12/15/2021   TRIG 223 (H) 12/15/2021   CHOLHDL 5.8 12/15/2021    Significant Diagnostic Results in last 30 days:  No results found.  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 Bloody drainage from the area. Concern for fistula, however, further characterization with imaging is appropriate. Continue Pradaxa give cardiac hx. Continue paroxetine for depression-- continue discussions regarding change in medication. Continue neurontin 100 mg nightly. Continue prilosec for GERd. Continue metformin 500 mg. Continue metoprolol 25 mg daily. Continue zanaflax 2 mg daily prn. Continue femara give hx of breast cancer. Continue glipizide for diabetes.    Family/ staff Communication: nursing, will call daughter at later date.   Labs/tests ordered:  CBC, CMP, ESR, CRP

## 2022-06-26 ENCOUNTER — Other Ambulatory Visit: Payer: Self-pay | Admitting: Nurse Practitioner

## 2022-06-26 LAB — COMPREHENSIVE METABOLIC PANEL
Albumin: 3.8 (ref 3.5–5.0)
Calcium: 11.1 — AB (ref 8.7–10.7)
Globulin: 2.7
eGFR: 91

## 2022-06-26 LAB — BASIC METABOLIC PANEL
BUN: 23 — AB (ref 4–21)
CO2: 28 — AB (ref 13–22)
Chloride: 102 (ref 99–108)
Creatinine: 0.5 (ref 0.5–1.1)
Glucose: 138
Potassium: 4.9 mEq/L (ref 3.5–5.1)
Sodium: 137 (ref 137–147)

## 2022-06-26 LAB — HEPATIC FUNCTION PANEL
ALT: 13 U/L (ref 7–35)
AST: 19 (ref 13–35)
Alkaline Phosphatase: 58 (ref 25–125)
Bilirubin, Total: 0.3

## 2022-06-26 LAB — CBC: RBC: 5.09 (ref 3.87–5.11)

## 2022-06-26 LAB — POCT ERYTHROCYTE SEDIMENTATION RATE, NON-AUTOMATED: Sed Rate: 17

## 2022-06-26 LAB — CBC AND DIFFERENTIAL
HCT: 43 (ref 36–46)
Hemoglobin: 13.8 (ref 12.0–16.0)
Neutrophils Absolute: 5347
Platelets: 173 10*3/uL (ref 150–400)
WBC: 8.5

## 2022-06-26 LAB — TSH: TSH: 0.12 — AB (ref 0.41–5.90)

## 2022-06-26 MED ORDER — ALPRAZOLAM 0.25 MG PO TABS: 0.2500 mg | ORAL_TABLET | Freq: Once | ORAL | 0 refills | Status: AC

## 2022-06-27 ENCOUNTER — Ambulatory Visit
Admission: RE | Admit: 2022-06-27 | Discharge: 2022-06-27 | Disposition: A | Discharge status: Home / Self Care | Payer: Medicare Other | Source: Ambulatory Visit | Attending: Nurse Practitioner | Admitting: Nurse Practitioner

## 2022-06-27 DIAGNOSIS — C50412 Malignant neoplasm of upper-outer quadrant of left female breast: Secondary | ICD-10-CM | POA: Insufficient documentation

## 2022-06-27 MED ORDER — IOHEXOL 300 MG/ML  SOLN
100.0000 mL | Freq: Once | INTRAMUSCULAR | Status: AC | PRN
  Administered 2022-06-27: 100 mL via INTRAVENOUS

## 2022-07-08 ENCOUNTER — Telehealth: Payer: Self-pay | Admitting: Student

## 2022-07-08 NOTE — Telephone Encounter (Signed)
I reached out to [facility] because I have been unable to reach pt by phone.  I have updated the phone # on file for her.   Ledell Noss states that she or pt hasn't gotten the results from the CT scan that she had done on 06/27/22 and was questioning if pt had already had the MRI done.    She states that pt is at [facility].   Please advise on results and if pt has already had the MRI completed or if it an addition to the CT scan.

## 2022-07-09 ENCOUNTER — Non-Acute Institutional Stay (SKILLED_NURSING_FACILITY): Payer: Medicare Other | Admitting: Student

## 2022-07-09 DIAGNOSIS — K644 Residual hemorrhoidal skin tags: Secondary | ICD-10-CM | POA: Diagnosis not present

## 2022-07-09 DIAGNOSIS — N9089 Other specified noninflammatory disorders of vulva and perineum: Secondary | ICD-10-CM

## 2022-07-09 DIAGNOSIS — F40240 Claustrophobia: Secondary | ICD-10-CM

## 2022-07-09 DIAGNOSIS — L988 Other specified disorders of the skin and subcutaneous tissue: Secondary | ICD-10-CM

## 2022-07-09 NOTE — Progress Notes (Signed)
Location:  [facility] Room Number: 215 A Place of Service:  SNF (31) Provider:  Theadore Nan, MD  Patient Care Team: Dewayne Shorter, MD as PCP - General (Family Medicine) Bary Castilla, Forest Gleason, MD (General Surgery) Lloyd Huger, MD as Consulting Physician (Oncology)  Extended Emergency Contact Information Primary Emergency Contact: Elwin Mocha Address: [address]          [place], [place] [address] Johnnette Litter of [place] Phone: [phone] Mobile Phone: [phone] Relation: Daughter  Code Status:  DNR Goals of care: Advanced Directive information    07/11/2022   11:18 AM  Advanced Directives  Does Patient Have a Medical Advance Directive? Yes  Type of Advance Directive Out of facility DNR (pink MOST or yellow form)  Does patient want to make changes to medical advance directive? No - Patient declined     Chief Complaint  Patient presents with   Rectal Bleeding    HPI:  Pt is a 89 y.o. female seen today for an acute visit for Rectal bleeding Nursing states she had "as much as a period" in her depends this morning.   Patient had CT scan that was unrevealing for the finding on exam.   Confirmed with nursing that we would like an MRI. Patient states the xanax  before previous imaging helped with her anxiety of closed spaces.    Past Medical History:  Diagnosis Date   Anxiety    Benign breast lumps    Blood clotting disorder (HCC)    Bone cancer (HCC)    head of femur, left leg ... amputation at age 44   Breast cancer of upper-outer quadrant of left female breast ([place]) 07/27/2017   11 mm invasive mammary carcinoma, ER 90%, PR 51-90%, negative margins.  Oncotype recurrence score: 1.  DCIS, margin less than 0.5 mm.  Negative sentinel node.  Accelerated partial breast radiation.   Cervicalgia    Chronic kidney disease    kidney stones   Colon cancer [facility])    patient unaware of this   Complication of anesthesia    mood alteration  / not sure if d/t pain medicine or anesthesia as she passed out    Cough    NASAL DRIP / SNEEZING / SORE THROAT MOSTLY CONSTANT   Depression    Diabetes ([place])    Diverticulitis    Dizzy    GERD (gastroesophageal reflux disease)    H/O blood clots    arm and leg   HBP (high blood pressure)    Hematuria    gross   Hemorrhoids    HLD (hyperlipidemia)    Microscopic hematuria 06/02/2015   Muscle pain    Osteoarthritis    Osteoporosis    Panic disorder    Personal history of radiation therapy    PND (post-nasal drip)    CHRONIC WITH SORE THROAT AND SNEEZING   Reflux    Sepsis ([place])    Sepsis, unspecified organism ([place]) 12/15/2021   Formatting of this note might be different from the original.  Last Assessment & Plan: Formatting of this note might be different from the original. Appears to be multifactorial and secondary to healthcare associated pneumonia as well as a UTI CT angio shows asymmetric airspace disease at the left base and posteriorly in the right upper lobe concerning for pneumonia Patient also has pyuria Patient   Severe sepsis ([place]) 12/15/2021   Skin cancer    nose   Swelling    Tremor  Tremors of nervous system    Ureteral stone with hydronephrosis 11/26/2018   Past Surgical History:  Procedure Laterality Date   APPENDECTOMY  1962   BREAST BIOPSY Right 2006?   benign   BREAST BIOPSY Left 2019   invasive mammary carcinoma   BREAST CYST EXCISION Left 07/27/2017   Procedure: SKIN CYST EXCISED;  Surgeon: Robert Bellow, MD;  Location: [facility] ORS;  Service: General;  Laterality: Left;   BREAST LUMPECTOMY Left 07/2017   invasive mammary, DCIS  mammosite   BREAST SURGERY Right 2019   CARPAL TUNNEL RELEASE Right    CATARACT EXTRACTION W/PHACO Left 11/11/2016   Procedure: CATARACT EXTRACTION PHACO AND INTRAOCULAR LENS PLACEMENT ([place]);  Surgeon: Birder Robson, MD;  Location: [facility] ORS;  Service: Ophthalmology;  Laterality: Left;  [place] 01:07.9 AP% 19.2 CDE  13.02 Fluid pack lot # [id number]   CATARACT EXTRACTION W/PHACO Right 12/09/2016   Procedure: CATARACT EXTRACTION PHACO AND INTRAOCULAR LENS PLACEMENT (IOC);  Surgeon: Birder Robson, MD;  Location: [facility] ORS;  Service: Ophthalmology;  Laterality: Right;  [place] 00:49 AP% 21.2 CDE 10.39 Fluid pack lot # [id number]   CHOLECYSTECTOMY     COLONOSCOPY WITH PROPOFOL N/A 11/22/2014   Procedure: COLONOSCOPY WITH PROPOFOL;  Surgeon: Manya Silvas, MD;  Location: [facility] ENDOSCOPY;  Service: Endoscopy;  Laterality: N/A;   CYSTOSCOPY WITH STENT PLACEMENT Bilateral 11/27/2018   Procedure: CYSTOSCOPY WITH STENT PLACEMENT;  Surgeon: Festus Aloe, MD;  Location: [facility] ORS;  Service: Urology;  Laterality: Bilateral;   CYSTOSCOPY/URETEROSCOPY/HOLMIUM LASER/STENT PLACEMENT Bilateral 12/17/2018   Procedure: CYSTOSCOPY/URETEROSCOPY/HOLMIUM LASER/STENT Exchange;  Surgeon: Billey Co, MD;  Location: [facility] ORS;  Service: Urology;  Laterality: Bilateral;   ESOPHAGOGASTRODUODENOSCOPY  11/22/2014   Procedure: ESOPHAGOGASTRODUODENOSCOPY (EGD);  Surgeon: Manya Silvas, MD;  Location: [facility] ENDOSCOPY;  Service: Endoscopy;;   ESOPHAGOGASTRODUODENOSCOPY (EGD) WITH PROPOFOL N/A 03/05/2017   Procedure: ESOPHAGOGASTRODUODENOSCOPY (EGD) WITH PROPOFOL;  Surgeon: Lin Landsman, MD;  Location: [facility] ENDOSCOPY;  Service: Gastroenterology;  Laterality: N/A;   EXTRACORPOREAL SHOCK WAVE LITHOTRIPSY Right 03/04/2018   Procedure: EXTRACORPOREAL SHOCK WAVE LITHOTRIPSY (ESWL);  Surgeon: Billey Co, MD;  Location: [facility] ORS;  Service: Urology;  Laterality: Right;   EYE SURGERY Bilateral 2012   cataract extraction with iol   GALLBLADDER SURGERY  1968   hemi pelvectomy     left side at age 54   HEMIPELVIC Left [place] Left    AMPUTATION d/t cancer at 89 years old   MASTECTOMY, PARTIAL Left 07/27/2017   Procedure: MASTECTOMY PARTIAL;  Surgeon: Robert Bellow, MD;  Location: [facility] ORS;  Service: General;   Laterality: Left;   MOUTH SURGERY  2019   teeth pulled with a bridge insertion.   fell out 12/16/18   OVARY SURGERY Right    cyst removed   SAVORY DILATION  11/22/2014   Procedure: SAVORY DILATION;  Surgeon: Manya Silvas, MD;  Location: [facility] ENDOSCOPY;  Service: Endoscopy;;   SENTINEL NODE BIOPSY Left 07/27/2017   Procedure: SENTINEL NODE BIOPSY;  Surgeon: Robert Bellow, MD;  Location: [facility] ORS;  Service: General;  Laterality: Left;   SKIN CANCER EXCISION     nose and arm and face   TEE WITHOUT CARDIOVERSION N/A 07/25/2019   Procedure: TRANSESOPHAGEAL ECHOCARDIOGRAM (TEE);  Surgeon: Corey Skains, MD;  Location: [facility] ORS;  Service: Cardiovascular;  Laterality: N/A;   TONSILLECTOMY      Allergies  Allergen Reactions   Ciprofloxacin Hives   Codeine Other (See Comments)  HALLUCINATIONS   Tape     Rash and skin irritation/ paper tape and tegaderm OK   Ciprocinonide [Fluocinolone] Other (See Comments)    unknown   Amoxicillin Other (See Comments)    Upset stomach Has patient had a PCN reaction causing immediate rash, facial/tongue/throat swelling, SOB or lightheadedness with hypotension: No Has patient had a PCN reaction causing severe rash involving mucus membranes or skin necrosis: No Has patient had a PCN reaction that required hospitalization: No Has patient had a PCN reaction occurring within the last 10 years: Yes If all of the above answers are "NO", then may proceed with Cephalosporin use.;   Cefuroxime Other (See Comments)    OK INTRACAMERALLY PER DR WLP, upset stomach   Latex Rash    Rast test NEGATIVE   Lipitor [Atorvastatin] Other (See Comments)    unknown    Outpatient Encounter Medications as of 07/09/2022  Medication Sig   ALPRAZolam (XANAX) 0.25 MG tablet Take 1 tablet (0.25 mg total) by mouth once as needed for up to 1 dose for anxiety (Before MRI).   acetaminophen (TYLENOL) 500 MG tablet Take 500 mg by mouth every 6 (six) hours as needed.    acetaminophen (TYLENOL) 650 MG CR tablet Take 650 mg by mouth every 8 (eight) hours as needed for pain.   Biotin 10000 MCG TABS Take 10,000 mcg by mouth at bedtime.   dextromethorphan-guaiFENesin (ROBITUSSIN-DM) 10-100 MG/5ML liquid Take 10 mLs by mouth every 4 (four) hours as needed for cough.   diclofenac Sodium (VOLTAREN) 1 % GEL Apply 4 g topically. Apply to stump topically every 6 hours as needed. Apply to right knee topically three times daily for pain.   gabapentin (NEURONTIN) 100 MG capsule Take 100 mg by mouth 3 (three) times daily.   GLIPIZIDE XL 2.5 MG 24 hr tablet Take 2.5 mg by mouth daily.    hydrocortisone 2.5 % cream Apply to Hemorrhoids/buttucks topically every 8 hours as needed for hemorrhoids.   Infant Care Products (DERMACLOUD) OINT Apply to Sacrum topically every shift for prevention   letrozole (FEMARA) 2.5 MG tablet TAKE ONE TABLET BY MOUTH EVERY DAY   loratadine (CLARITIN) 10 MG tablet Take 10 mg by mouth daily.   melatonin 3 MG TABS tablet Take 3 mg by mouth at bedtime.   metFORMIN (GLUCOPHAGE) 500 MG tablet Take 750 mg by mouth 2 (two) times daily with a meal.   metoprolol tartrate (LOPRESSOR) 25 MG tablet Take 1 tablet (25 mg total) by mouth 3 (three) times daily.   Multiple Vitamin (MULTIVITAMIN WITH MINERALS) TABS tablet Take 1 tablet by mouth daily.   omeprazole (PRILOSEC) 40 MG capsule Take 40 mg by mouth daily.    ondansetron (ZOFRAN) 4 MG tablet Take 4 mg by mouth every 4 (four) hours as needed for nausea or vomiting.   OXYGEN 2lpm as needed for sats less than 88% every 1 hour as needed   PARoxetine (PAXIL) 30 MG tablet Take 30 mg by mouth daily.   Polyethyl Glycol-Propyl Glycol 0.4-0.3 % SOLN Apply to eye 2 (two) times daily as needed.   polyethylene glycol (MIRALAX / GLYCOLAX) 17 g packet Take 17 g by mouth daily as needed.   PRADAXA 150 MG CAPS capsule TAKE 1 CAPSULE BY MOUTH TWICE DAILY START TREATMENT 10 HRS LAST DOSE OF LOVENOX   tiZANidine (ZANAFLEX) 2 MG  tablet Take 2 mg by mouth every 12 (twelve) hours as needed for muscle spasms.   No facility-administered encounter medications on file  as of 07/09/2022.    Review of Systems  Immunization History  Administered Date(s) Administered   Influenza-Unspecified 02/03/2020, 02/18/2021, 02/18/2022   Pneumococcal Conjugate-13 09/28/2017   Unspecified SARS-COV-2 Vaccination 05/20/2019, 06/17/2019, 03/16/2020, 09/20/2020, 01/25/2021   Pertinent  Health Maintenance Due  Topic Date Due   OPHTHALMOLOGY EXAM  Never done   FOOT EXAM  11/16/2020   HEMOGLOBIN A1C  11/27/2022   INFLUENZA VACCINE  Completed   DEXA SCAN  Completed      12/18/2021    9:00 AM 12/18/2021    7:45 PM 12/19/2021    7:00 AM 12/19/2021   12:00 PM 01/24/2022   11:20 AM  Fall Risk  (RETIRED) Patient Fall Risk Level High fall risk High fall risk High fall risk High fall risk High fall risk   Functional Status Survey:    Vitals:   07/09/22 1624  BP: (!) 126/50  Pulse: (!) 53  Resp: 18  SpO2: 93%  Weight: 159 lb 12.8 oz (72.5 kg)   Body mass index is 28.31 kg/m. Physical Exam Constitutional:      Appearance: Normal appearance.  Skin:    Comments: Vulva with some erythema, build up of cream caked between minora and majora.   Near the scar of left hemipelvectomy is a 48m fistula that tracks ~2cm  Neurological:     Mental Status: She is alert and oriented to person, place, and time.     Labs reviewed: Recent Labs    12/17/21 0533 12/18/21 1207 12/19/21 0621 01/24/22 1051 01/24/22 1157 01/31/22 1053 03/05/22 0000 06/26/22 0000  NA 136  --  139 143  --  137 141 137  K 3.5  --  3.6 4.7  --  4.8 4.3 4.9  CL 109  --  112* 107  --  110 103 102  CO2 20*  --  20* 31  --  23 29* 28*  GLUCOSE 199*  --  243* 198*  --  236*  --   --   BUN 28*  --  21 22  --  22 18 23*  CREATININE 0.61  --  0.53 0.69  --  0.74 0.5 0.5  CALCIUM 9.5  --  9.4 11.8*   < > 9.7 10.7 11.1*  MG 1.5* 1.8  --   --   --   --   --   --    < >  = values in this interval not displayed.   Recent Labs    12/15/21 1011 12/17/21 0533 03/05/22 0000 06/26/22 0000  AST 33 '25 18 19  '$ ALT '17 26 10 13  '$ ALKPHOS 43 45 65 58  BILITOT 0.5 0.5  --   --   PROT 6.8 6.0*  --   --   ALBUMIN 3.3* 2.7* 4.0 3.8   Recent Labs    12/16/21 0529 12/17/21 0533 01/24/22 1051 03/05/22 0000 06/26/22 0000 07/10/22 0000  WBC 12.3* 8.6 9.3 8.6 8.5 9.2  NEUTROABS  --   --  5.9 5,616.00 5,347.00  --   HGB 10.5* 10.9* 12.2 13.1 13.8 14.1  HCT 34.8* 36.4 41.0 42 43 44  MCV 81.7 81.3 86.0  --   --   --   PLT 127* 125* 218 205 173 165   Lab Results  Component Value Date   TSH 0.12 (A) 06/26/2022   Lab Results  Component Value Date   HGBA1C 7.4 05/29/2022   Lab Results  Component Value Date   CHOL 110 12/15/2021  HDL 19 (L) 12/15/2021   LDLCALC 46 12/15/2021   TRIG 223 (H) 12/15/2021   CHOLHDL 5.8 12/15/2021    Significant Diagnostic Results in last 30 days:  CT CHEST ABDOMEN PELVIS W CONTRAST  Result Date: 06/29/2022 CLINICAL DATA:  History of breast cancer, evaluate for metastatic disease, new hypercalcemia * Tracking Code: BO * EXAM: CT CHEST, ABDOMEN, AND PELVIS WITH CONTRAST TECHNIQUE: Multidetector CT imaging of the chest, abdomen and pelvis was performed following the standard protocol during bolus administration of intravenous contrast. RADIATION DOSE REDUCTION: This exam was performed according to the departmental dose-optimization program which includes automated exposure control, adjustment of the mA and/or kV according to patient size and/or use of iterative reconstruction technique. CONTRAST:  131m OMNIPAQUE IOHEXOL 300 MG/ML  SOLN COMPARISON:  12/15/2021 FINDINGS: CT CHEST FINDINGS Cardiovascular: Aortic atherosclerosis. Normal heart size. Left and right coronary artery calcifications. No pericardial effusion. Mediastinum/Nodes: No enlarged mediastinal, hilar, or axillary lymph nodes. Thyroid gland, trachea, and esophagus  demonstrate no significant findings. Lungs/Pleura: Similar-appearing atelectasis or consolidation of the left lung base (series 4, image 81). Multiple small pulmonary nodules unchanged, for example in the peripheral anterior right lower lobe measuring 0.8 cm (series 4, image 65), in the anteromedial segment right middle lobe measuring 0.6 cm (series 4, image 65), in the peripheral lingula measuring 0.4 cm (series 4, image 49) and in the posterior right upper lobe measuring 0.6 cm (series 4, image 32). No pleural effusion or pneumothorax. Musculoskeletal: Postoperative findings of prior left lumpectomy (series 2, image 46). No acute osseous findings. CT ABDOMEN PELVIS FINDINGS Hepatobiliary: No focal liver abnormality is seen. Status post cholecystectomy. No biliary dilatation. Pancreas: Unremarkable. No pancreatic ductal dilatation or surrounding inflammatory changes. Spleen: Normal in size without significant abnormality. Adrenals/Urinary Tract: Adrenal glands are unremarkable. Multiple small bilateral nonobstructive renal calculi. No ureteral calculi or hydronephrosis. Simple benign bilateral renal cortical cysts, for which no further follow-up or characterization is required. Bladder is unremarkable. Stomach/Bowel: Stomach is within normal limits. Appendix is not clearly visualized and may be surgically absent. No evidence of bowel wall thickening, distention, or inflammatory changes. Descending and sigmoid diverticulosis. Vascular/Lymphatic: Aortic atherosclerosis. No enlarged abdominal or pelvic lymph nodes. Reproductive: Calcified fibroids. Unchanged left ovarian cyst measuring 2.0 x 1.6 cm, stable and benign, for which no further follow-up or characterization is required. No follow-up imaging recommended. Note: This recommendation does not apply to premenarchal patients and to those with increased risk (genetic, family history, elevated tumor markers or other high-risk factors) of ovarian cancer. Reference:  JACR 2020 Feb; 17(2):248-254 Other: No abdominal wall hernia or abnormality. No ascites. Musculoskeletal: No acute osseous findings. Status post left hemipelvectomy and lower extremity disarticulation. IMPRESSION: 1. Multiple unchanged small bilateral pulmonary nodules. Given well-established stability these are almost certainly benign and incidental however metastatic disease is not strictly excluded in the setting of known breast malignancy. No new nodules. 2. No evidence of lymphadenopathy or metastatic disease in the chest, abdomen, or pelvis. 3. Status post left hemipelvectomy and lower extremity disarticulation. 4. Bilateral nonobstructive nephrolithiasis. 5. Coronary artery disease. Aortic Atherosclerosis (ICD10-I70.0). Electronically Signed   By: ADelanna AhmadiM.D.   On: 06/29/2022 16:15    Assessment/Plan Claustrophobia - Plan: ALPRAZolam (XANAX) 0.25 MG tablet  Vulvar irritation  Fistula  External hemorrhoid, bleeding Patient with upcoming MRI needs anxiety medication before. Vulvar irritation plan to apply barrier cream with nystatin powder. Clean fistula with NS and pack with iodoform gauze MWF. Apply Hemrroid cream. CBC to evaluate for anemia  given extent of bleeding.   Family/ staff Communication: nursing  Labs/tests ordered:  none

## 2022-07-10 LAB — CBC: RBC: 5.07 (ref 3.87–5.11)

## 2022-07-10 LAB — CBC AND DIFFERENTIAL
HCT: 44 (ref 36–46)
Hemoglobin: 14.1 (ref 12.0–16.0)
Platelets: 165 10*3/uL (ref 150–400)
WBC: 9.2

## 2022-07-11 ENCOUNTER — Encounter: Payer: Self-pay | Admitting: Adult Health

## 2022-07-11 ENCOUNTER — Non-Acute Institutional Stay (SKILLED_NURSING_FACILITY): Payer: Medicare Other | Admitting: Adult Health

## 2022-07-11 ENCOUNTER — Encounter: Payer: Self-pay | Admitting: Student

## 2022-07-11 DIAGNOSIS — J029 Acute pharyngitis, unspecified: Secondary | ICD-10-CM

## 2022-07-11 MED ORDER — ALPRAZOLAM 0.25 MG PO TABS: 0.2500 mg | ORAL_TABLET | Freq: Once | ORAL | 0 refills | Status: DC | PRN

## 2022-07-11 NOTE — Progress Notes (Signed)
Location:  Other [facility].  Nursing Home Room Number: [place] of Service:  SNF [phone]) Provider:  Durenda Age, NP  PCP: Dewayne Shorter, MD  Patient Care Team: Dewayne Shorter, MD as PCP - General (Family Medicine) Bary Castilla Forest Gleason, MD (General Surgery) Lloyd Huger, MD as Consulting Physician (Oncology)  Extended Emergency Contact Information Primary Emergency Contact: Elwin Mocha Address: [address]          [place], [place] [address] Johnnette Litter of [place] Phone: [phone] Mobile Phone: [phone] Relation: Daughter  Code Status:  DNR Goals of care: Advanced Directive information    07/11/2022   11:18 AM  Advanced Directives  Does Patient Have a Medical Advance Directive? Yes  Type of Advance Directive Out of facility DNR (pink MOST or yellow form)  Does patient want to make changes to medical advance directive? No - Patient declined     Chief Complaint  Patient presents with   Acute Visit    Cough and Sore Throat.     HPI:  Pt is a 89 y.o. female seen today for an acute visit for sore throat and dry cough for 3-4 days. She has chills but no fever. Tussin DM PRN is currently being administered for cough. She was tested today for COVID-19 and was negative.    Past Medical History:  Diagnosis Date   Anxiety    Benign breast lumps    Blood clotting disorder (HCC)    Bone cancer (HCC)    head of femur, left leg ... amputation at age 55   Breast cancer of upper-outer quadrant of left female breast ([place]) 07/27/2017   11 mm invasive mammary carcinoma, ER 90%, PR 51-90%, negative margins.  Oncotype recurrence score: 1.  DCIS, margin less than 0.5 mm.  Negative sentinel node.  Accelerated partial breast radiation.   Cervicalgia    Chronic kidney disease    kidney stones   Colon cancer [facility])    patient unaware of this   Complication of anesthesia    mood alteration / not sure if d/t pain medicine or anesthesia as she  passed out    Cough    NASAL DRIP / SNEEZING / SORE THROAT MOSTLY CONSTANT   Depression    Diabetes ([place])    Diverticulitis    Dizzy    GERD (gastroesophageal reflux disease)    H/O blood clots    arm and leg   HBP (high blood pressure)    Hematuria    gross   Hemorrhoids    HLD (hyperlipidemia)    Microscopic hematuria 06/02/2015   Muscle pain    Osteoarthritis    Osteoporosis    Panic disorder    Personal history of radiation therapy    PND (post-nasal drip)    CHRONIC WITH SORE THROAT AND SNEEZING   Reflux    Sepsis ([place])    Sepsis, unspecified organism ([place]) 12/15/2021   Formatting of this note might be different from the original.  Last Assessment & Plan: Formatting of this note might be different from the original. Appears to be multifactorial and secondary to healthcare associated pneumonia as well as a UTI CT angio shows asymmetric airspace disease at the left base and posteriorly in the right upper lobe concerning for pneumonia Patient also has pyuria Patient   Severe sepsis ([place]) 12/15/2021   Skin cancer    nose   Swelling    Tremor    Tremors of nervous system  Ureteral stone with hydronephrosis 11/26/2018   Past Surgical History:  Procedure Laterality Date   APPENDECTOMY  1962   BREAST BIOPSY Right 2006?   benign   BREAST BIOPSY Left 2019   invasive mammary carcinoma   BREAST CYST EXCISION Left 07/27/2017   Procedure: SKIN CYST EXCISED;  Surgeon: Robert Bellow, MD;  Location: [facility] ORS;  Service: General;  Laterality: Left;   BREAST LUMPECTOMY Left 07/2017   invasive mammary, DCIS  mammosite   BREAST SURGERY Right 2019   CARPAL TUNNEL RELEASE Right    CATARACT EXTRACTION W/PHACO Left 11/11/2016   Procedure: CATARACT EXTRACTION PHACO AND INTRAOCULAR LENS PLACEMENT ([place]);  Surgeon: Birder Robson, MD;  Location: [facility] ORS;  Service: Ophthalmology;  Laterality: Left;  [place] 01:07.9 AP% 19.2 CDE 13.02 Fluid pack lot # [id number]   CATARACT EXTRACTION  W/PHACO Right 12/09/2016   Procedure: CATARACT EXTRACTION PHACO AND INTRAOCULAR LENS PLACEMENT (I[facility]);  Surgeon: Birder Robson, MD;  Location: [facility] ORS;  Service: Ophthalmology;  Laterality: Right;  [place] 00:49 AP% 21.2 CDE 10.39 Fluid pack lot # [id number]   CHOLECYSTECTOMY     COLONOSCOPY WITH PROPOFOL N/A 11/22/2014   Procedure: COLONOSCOPY WITH PROPOFOL;  Surgeon: Manya Silvas, MD;  Location: [facility] ENDOSCOPY;  Service: Endoscopy;  Laterality: N/A;   CYSTOSCOPY WITH STENT PLACEMENT Bilateral 11/27/2018   Procedure: CYSTOSCOPY WITH STENT PLACEMENT;  Surgeon: Festus Aloe, MD;  Location: [facility] ORS;  Service: Urology;  Laterality: Bilateral;   CYSTOSCOPY/URETEROSCOPY/HOLMIUM LASER/STENT PLACEMENT Bilateral 12/17/2018   Procedure: CYSTOSCOPY/URETEROSCOPY/HOLMIUM LASER/STENT Exchange;  Surgeon: Billey Co, MD;  Location: [facility] ORS;  Service: Urology;  Laterality: Bilateral;   ESOPHAGOGASTRODUODENOSCOPY  11/22/2014   Procedure: ESOPHAGOGASTRODUODENOSCOPY (EGD);  Surgeon: Manya Silvas, MD;  Location: [facility] ENDOSCOPY;  Service: Endoscopy;;   ESOPHAGOGASTRODUODENOSCOPY (EGD) WITH PROPOFOL N/A 03/05/2017   Procedure: ESOPHAGOGASTRODUODENOSCOPY (EGD) WITH PROPOFOL;  Surgeon: Lin Landsman, MD;  Location: [facility] ENDOSCOPY;  Service: Gastroenterology;  Laterality: N/A;   EXTRACORPOREAL SHOCK WAVE LITHOTRIPSY Right 03/04/2018   Procedure: EXTRACORPOREAL SHOCK WAVE LITHOTRIPSY (ESWL);  Surgeon: Billey Co, MD;  Location: [facility] ORS;  Service: Urology;  Laterality: Right;   EYE SURGERY Bilateral 2012   cataract extraction with iol   GALLBLADDER SURGERY  1968   hemi pelvectomy     left side at age 15   HEMIPELVIC Left [place] Left    AMPUTATION d/t cancer at 89 years old   MASTECTOMY, PARTIAL Left 07/27/2017   Procedure: MASTECTOMY PARTIAL;  Surgeon: Robert Bellow, MD;  Location: [facility] ORS;  Service: General;  Laterality: Left;   MOUTH SURGERY  2019   teeth pulled with a  bridge insertion.   fell out 12/16/18   OVARY SURGERY Right    cyst removed   SAVORY DILATION  11/22/2014   Procedure: SAVORY DILATION;  Surgeon: Manya Silvas, MD;  Location: [facility] ENDOSCOPY;  Service: Endoscopy;;   SENTINEL NODE BIOPSY Left 07/27/2017   Procedure: SENTINEL NODE BIOPSY;  Surgeon: Robert Bellow, MD;  Location: [facility] ORS;  Service: General;  Laterality: Left;   SKIN CANCER EXCISION     nose and arm and face   TEE WITHOUT CARDIOVERSION N/A 07/25/2019   Procedure: TRANSESOPHAGEAL ECHOCARDIOGRAM (TEE);  Surgeon: Corey Skains, MD;  Location: [facility] ORS;  Service: Cardiovascular;  Laterality: N/A;   TONSILLECTOMY      Allergies  Allergen Reactions   Ciprofloxacin Hives   Codeine Other (See Comments)    HALLUCINATIONS   Tape  Rash and skin irritation/ paper tape and tegaderm OK   Ciprocinonide [Fluocinolone] Other (See Comments)    unknown   Amoxicillin Other (See Comments)    Upset stomach Has patient had a PCN reaction causing immediate rash, facial/tongue/throat swelling, SOB or lightheadedness with hypotension: No Has patient had a PCN reaction causing severe rash involving mucus membranes or skin necrosis: No Has patient had a PCN reaction that required hospitalization: No Has patient had a PCN reaction occurring within the last 10 years: Yes If all of the above answers are "NO", then may proceed with Cephalosporin use.;   Cefuroxime Other (See Comments)    OK INTRACAMERALLY PER DR WLP, upset stomach   Latex Rash    Rast test NEGATIVE   Lipitor [Atorvastatin] Other (See Comments)    unknown    Outpatient Encounter Medications as of 07/11/2022  Medication Sig   acetaminophen (TYLENOL) 500 MG tablet Take 500 mg by mouth every 6 (six) hours as needed.   acetaminophen (TYLENOL) 650 MG CR tablet Take 650 mg by mouth every 8 (eight) hours as needed for pain.   Biotin 10000 MCG TABS Take 10,000 mcg by mouth at bedtime.   dextromethorphan-guaiFENesin  (ROBITUSSIN-DM) 10-100 MG/5ML liquid Take 10 mLs by mouth every 4 (four) hours as needed for cough.   diclofenac Sodium (VOLTAREN) 1 % GEL Apply 4 g topically. Apply to stump topically every 6 hours as needed. Apply to right knee topically three times daily for pain.   gabapentin (NEURONTIN) 100 MG capsule Take 100 mg by mouth 3 (three) times daily.   GLIPIZIDE XL 2.5 MG 24 hr tablet Take 2.5 mg by mouth daily.    hydrocortisone 2.5 % cream Apply to Hemorrhoids/buttucks topically every 8 hours as needed for hemorrhoids.   hydrocortisone cream 1 % Apply to rectum topically every 6 hours as needed   Infant Care Products (DERMACLOUD) OINT Apply to Sacrum topically every shift for prevention   letrozole (FEMARA) 2.5 MG tablet TAKE ONE TABLET BY MOUTH EVERY DAY   loratadine (CLARITIN) 10 MG tablet Take 10 mg by mouth daily.   melatonin 3 MG TABS tablet Take 3 mg by mouth at bedtime.   metFORMIN (GLUCOPHAGE) 500 MG tablet Take 750 mg by mouth 2 (two) times daily with a meal.   metoprolol tartrate (LOPRESSOR) 25 MG tablet Take 1 tablet (25 mg total) by mouth 3 (three) times daily.   Multiple Vitamin (MULTIVITAMIN WITH MINERALS) TABS tablet Take 1 tablet by mouth daily.   omeprazole (PRILOSEC) 40 MG capsule Take 40 mg by mouth daily.    ondansetron (ZOFRAN) 4 MG tablet Take 4 mg by mouth every 4 (four) hours as needed for nausea or vomiting.   OXYGEN 2lpm as needed for sats less than 88% every 1 hour as needed   PARoxetine (PAXIL) 30 MG tablet Take 30 mg by mouth daily.   Polyethyl Glycol-Propyl Glycol 0.4-0.3 % SOLN Apply to eye 2 (two) times daily as needed.   polyethylene glycol (MIRALAX / GLYCOLAX) 17 g packet Take 17 g by mouth daily as needed.   PRADAXA 150 MG CAPS capsule TAKE 1 CAPSULE BY MOUTH TWICE DAILY START TREATMENT 10 HRS LAST DOSE OF LOVENOX   tiZANidine (ZANAFLEX) 2 MG tablet Take 2 mg by mouth every 12 (twelve) hours as needed for muscle spasms.   No facility-administered encounter  medications on file as of 07/11/2022.    Review of Systems  Constitutional:  Negative for appetite change, chills, fatigue and fever.  HENT:  Positive for sore throat. Negative for congestion, hearing loss and rhinorrhea.   Eyes: Negative.   Respiratory:  Positive for cough. Negative for shortness of breath and wheezing.   Cardiovascular:  Negative for chest pain, palpitations and leg swelling.  Gastrointestinal:  Negative for abdominal pain, constipation, diarrhea, nausea and vomiting.  Genitourinary:  Negative for dysuria.  Musculoskeletal:  Negative for arthralgias, back pain and myalgias.  Skin:  Negative for color change, rash and wound.  Neurological:  Negative for dizziness, weakness and headaches.  Psychiatric/Behavioral:  Negative for behavioral problems. The patient is not nervous/anxious.     Immunization History  Administered Date(s) Administered   Influenza-Unspecified 02/03/2020, 02/18/2021, 02/18/2022   Pneumococcal Conjugate-13 09/28/2017   Unspecified SARS-COV-2 Vaccination 05/20/2019, 06/17/2019, 03/16/2020, 09/20/2020, 01/25/2021   Pertinent  Health Maintenance Due  Topic Date Due   OPHTHALMOLOGY EXAM  Never done   FOOT EXAM  11/16/2020   HEMOGLOBIN A1C  11/27/2022   INFLUENZA VACCINE  Completed   DEXA SCAN  Completed      12/18/2021    9:00 AM 12/18/2021    7:45 PM 12/19/2021    7:00 AM 12/19/2021   12:00 PM 01/24/2022   11:20 AM  Fall Risk  (RETIRED) Patient Fall Risk Level High fall risk High fall risk High fall risk High fall risk High fall risk   Functional Status Survey:    Vitals:   07/11/22 1104  BP: (!) 126/50  Pulse: (!) 53  Resp: 20  Temp: (!) 97.1 F (36.2 C)  SpO2: 90%  Weight: 159 lb 12.8 oz (72.5 kg)  Height: 5\' 3"  (1.6 m)   Body mass index is 28.31 kg/m. Physical Exam Constitutional:      General: She is not in acute distress. HENT:     Head: Normocephalic and atraumatic.     Nose: Nose normal.     Mouth/Throat:     Mouth:  Mucous membranes are moist.  Eyes:     Conjunctiva/sclera: Conjunctivae normal.  Cardiovascular:     Rate and Rhythm: Normal rate and regular rhythm.  Pulmonary:     Effort: Pulmonary effort is normal.     Breath sounds: Normal breath sounds.  Abdominal:     General: Bowel sounds are normal.     Palpations: Abdomen is soft.  Musculoskeletal:     Cervical back: Normal range of motion.  Skin:    General: Skin is warm and dry.  Neurological:     General: No focal deficit present.     Mental Status: She is alert and oriented to person, place, and time.  Psychiatric:        Mood and Affect: Mood normal.        Behavior: Behavior normal.        Thought Content: Thought content normal.        Judgment: Judgment normal.     Labs reviewed: Recent Labs    12/17/21 0533 12/18/21 1207 12/19/21 0621 01/24/22 1051 01/24/22 1157 01/31/22 1053 03/05/22 0000 06/26/22 0000  NA 136  --  139 143  --  137 141 137  K 3.5  --  3.6 4.7  --  4.8 4.3 4.9  CL 109  --  112* 107  --  110 103 102  CO2 20*  --  20* 31  --  23 29* 28*  GLUCOSE 199*  --  243* 198*  --  236*  --   --   BUN 28*  --  21  22  --  22 18 23*  CREATININE 0.61  --  0.53 0.69  --  0.74 0.5 0.5  CALCIUM 9.5  --  9.4 11.8*   < > 9.7 10.7 11.1*  MG 1.5* 1.8  --   --   --   --   --   --    < > = values in this interval not displayed.   Recent Labs    12/15/21 1011 12/17/21 0533 03/05/22 0000 06/26/22 0000  AST 33 25 18 19   ALT 17 26 10 13   ALKPHOS 43 45 65 58  BILITOT 0.5 0.5  --   --   PROT 6.8 6.0*  --   --   ALBUMIN 3.3* 2.7* 4.0 3.8   Recent Labs    12/16/21 0529 12/17/21 0533 01/24/22 1051 03/05/22 0000 06/26/22 0000 07/10/22 0000  WBC 12.3* 8.6 9.3 8.6 8.5 9.2  NEUTROABS  --   --  5.9 5,616.00 5,347.00  --   HGB 10.5* 10.9* 12.2 13.1 13.8 14.1  HCT 34.8* 36.4 41.0 42 43 44  MCV 81.7 81.3 86.0  --   --   --   PLT 127* 125* 218 205 173 165   Lab Results  Component Value Date   TSH 0.12 (A)  06/26/2022   Lab Results  Component Value Date   HGBA1C 7.4 05/29/2022   Lab Results  Component Value Date   CHOL 110 12/15/2021   HDL 19 (L) 12/15/2021   LDLCALC 46 12/15/2021   TRIG 223 (H) 12/15/2021   CHOLHDL 5.8 12/15/2021    Significant Diagnostic Results in last 30 days:  CT CHEST ABDOMEN PELVIS W CONTRAST  Result Date: 06/29/2022 CLINICAL DATA:  History of breast cancer, evaluate for metastatic disease, new hypercalcemia * Tracking Code: BO * EXAM: CT CHEST, ABDOMEN, AND PELVIS WITH CONTRAST TECHNIQUE: Multidetector CT imaging of the chest, abdomen and pelvis was performed following the standard protocol during bolus administration of intravenous contrast. RADIATION DOSE REDUCTION: This exam was performed according to the departmental dose-optimization program which includes automated exposure control, adjustment of the mA and/or kV according to patient size and/or use of iterative reconstruction technique. CONTRAST:  163mL OMNIPAQUE IOHEXOL 300 MG/ML  SOLN COMPARISON:  12/15/2021 FINDINGS: CT CHEST FINDINGS Cardiovascular: Aortic atherosclerosis. Normal heart size. Left and right coronary artery calcifications. No pericardial effusion. Mediastinum/Nodes: No enlarged mediastinal, hilar, or axillary lymph nodes. Thyroid gland, trachea, and esophagus demonstrate no significant findings. Lungs/Pleura: Similar-appearing atelectasis or consolidation of the left lung base (series 4, image 81). Multiple small pulmonary nodules unchanged, for example in the peripheral anterior right lower lobe measuring 0.8 cm (series 4, image 65), in the anteromedial segment right middle lobe measuring 0.6 cm (series 4, image 65), in the peripheral lingula measuring 0.4 cm (series 4, image 49) and in the posterior right upper lobe measuring 0.6 cm (series 4, image 32). No pleural effusion or pneumothorax. Musculoskeletal: Postoperative findings of prior left lumpectomy (series 2, image 46). No acute osseous  findings. CT ABDOMEN PELVIS FINDINGS Hepatobiliary: No focal liver abnormality is seen. Status post cholecystectomy. No biliary dilatation. Pancreas: Unremarkable. No pancreatic ductal dilatation or surrounding inflammatory changes. Spleen: Normal in size without significant abnormality. Adrenals/Urinary Tract: Adrenal glands are unremarkable. Multiple small bilateral nonobstructive renal calculi. No ureteral calculi or hydronephrosis. Simple benign bilateral renal cortical cysts, for which no further follow-up or characterization is required. Bladder is unremarkable. Stomach/Bowel: Stomach is within normal limits. Appendix is not clearly visualized and may be  surgically absent. No evidence of bowel wall thickening, distention, or inflammatory changes. Descending and sigmoid diverticulosis. Vascular/Lymphatic: Aortic atherosclerosis. No enlarged abdominal or pelvic lymph nodes. Reproductive: Calcified fibroids. Unchanged left ovarian cyst measuring 2.0 x 1.6 cm, stable and benign, for which no further follow-up or characterization is required. No follow-up imaging recommended. Note: This recommendation does not apply to premenarchal patients and to those with increased risk (genetic, family history, elevated tumor markers or other high-risk factors) of ovarian cancer. Reference: JACR 2020 Feb; 17(2):248-254 Other: No abdominal wall hernia or abnormality. No ascites. Musculoskeletal: No acute osseous findings. Status post left hemipelvectomy and lower extremity disarticulation. IMPRESSION: 1. Multiple unchanged small bilateral pulmonary nodules. Given well-established stability these are almost certainly benign and incidental however metastatic disease is not strictly excluded in the setting of known breast malignancy. No new nodules. 2. No evidence of lymphadenopathy or metastatic disease in the chest, abdomen, or pelvis. 3. Status post left hemipelvectomy and lower extremity disarticulation. 4. Bilateral  nonobstructive nephrolithiasis. 5. Coronary artery disease. Aortic Atherosclerosis (ICD10-I70.0). Electronically Signed   By: Delanna Ahmadi M.D.   On: 06/29/2022 16:15    Assessment/Plan  1. Acute pharyngitis, unspecified etiology -  will start patient on Doxycycline 100 mg BID X 7 days -  drink plenty of fluids -  continue Tussin DM PRN    Family/ staff Communication:  Discussed plan of care with resident and charge nurse.  Labs/tests ordered:  None

## 2022-07-20 ENCOUNTER — Telehealth: Payer: Self-pay | Admitting: Family

## 2022-07-20 NOTE — Telephone Encounter (Signed)
[facility] Nurse called states patient has been very confused.currently on isolation since she tested positive for COVID-19 last week.Daughter states usually confused when she has UTI.request urine specimen to rule out UTI.Nurse advised to collect urine for U/A and C/S.Please follow up.

## 2022-07-21 ENCOUNTER — Non-Acute Institutional Stay (SKILLED_NURSING_FACILITY): Payer: Medicare Other | Admitting: Student

## 2022-07-21 ENCOUNTER — Encounter: Payer: Self-pay | Admitting: Student

## 2022-07-21 DIAGNOSIS — R319 Hematuria, unspecified: Secondary | ICD-10-CM

## 2022-07-21 DIAGNOSIS — R410 Disorientation, unspecified: Secondary | ICD-10-CM

## 2022-07-21 NOTE — Progress Notes (Signed)
Location:  Other [place].  Nursing Home Room Number: [place] of Service:  SNF [phone]) Provider:  Dewayne Shorter, MD  Patient Care Team: Dewayne Shorter, MD as PCP - General (Family Medicine) Bary Castilla Forest Gleason, MD (General Surgery) Lloyd Huger, MD as Consulting Physician (Oncology)  Extended Emergency Contact Information Primary Emergency Contact: Elwin Mocha Address: [address]          [place], [place] [address] Johnnette Litter of [place] Phone: [phone] Mobile Phone: [phone] Relation: Daughter  Code Status:  DNR Goals of care: Advanced Directive information    07/21/2022   11:23 AM  Advanced Directives  Does Patient Have a Medical Advance Directive? Yes  Type of Advance Directive Out of facility DNR (pink MOST or yellow form)  Does patient want to make changes to medical advance directive? No - Patient declined     Chief Complaint  Patient presents with   Acute Visit    Confusion.     HPI:  Pt is a 89 y.o. female seen today for an acute visit for some confusion and hematuria noted by nursing staff. Patient has had di   Past Medical History:  Diagnosis Date   Anxiety    Benign breast lumps    Blood clotting disorder (HCC)    Bone cancer (HCC)    head of femur, left leg ... amputation at age 25   Breast cancer of upper-outer quadrant of left female breast ([place]) 07/27/2017   11 mm invasive mammary carcinoma, ER 90%, PR 51-90%, negative margins.  Oncotype recurrence score: 1.  DCIS, margin less than 0.5 mm.  Negative sentinel node.  Accelerated partial breast radiation.   Cervicalgia    Chronic kidney disease    kidney stones   Colon cancer [facility])    patient unaware of this   Complication of anesthesia    mood alteration / not sure if d/t pain medicine or anesthesia as she passed out    Cough    NASAL DRIP / SNEEZING / SORE THROAT MOSTLY CONSTANT   Depression    Diabetes ([place])    Diverticulitis    Dizzy    GERD  (gastroesophageal reflux disease)    H/O blood clots    arm and leg   HBP (high blood pressure)    Hematuria    gross   Hemorrhoids    HLD (hyperlipidemia)    Microscopic hematuria 06/02/2015   Muscle pain    Osteoarthritis    Osteoporosis    Panic disorder    Personal history of radiation therapy    PND (post-nasal drip)    CHRONIC WITH SORE THROAT AND SNEEZING   Reflux    Sepsis ([place])    Sepsis, unspecified organism ([place]) 12/15/2021   Formatting of this note might be different from the original.  Last Assessment & Plan: Formatting of this note might be different from the original. Appears to be multifactorial and secondary to healthcare associated pneumonia as well as a UTI CT angio shows asymmetric airspace disease at the left base and posteriorly in the right upper lobe concerning for pneumonia Patient also has pyuria Patient   Severe sepsis ([place]) 12/15/2021   Skin cancer    nose   Swelling    Tremor    Tremors of nervous system    Ureteral stone with hydronephrosis 11/26/2018   Past Surgical History:  Procedure Laterality Date   APPENDECTOMY  1962   BREAST BIOPSY Right 2006?   benign  BREAST BIOPSY Left 2019   invasive mammary carcinoma   BREAST CYST EXCISION Left 07/27/2017   Procedure: SKIN CYST EXCISED;  Surgeon: Robert Bellow, MD;  Location: [facility] ORS;  Service: General;  Laterality: Left;   BREAST LUMPECTOMY Left 07/2017   invasive mammary, DCIS  mammosite   BREAST SURGERY Right 2019   CARPAL TUNNEL RELEASE Right    CATARACT EXTRACTION W/PHACO Left 11/11/2016   Procedure: CATARACT EXTRACTION PHACO AND INTRAOCULAR LENS PLACEMENT ([place]);  Surgeon: Birder Robson, MD;  Location: [facility] ORS;  Service: Ophthalmology;  Laterality: Left;  [place] 01:07.9 AP% 19.2 CDE 13.02 Fluid pack lot # [id number]   CATARACT EXTRACTION W/PHACO Right 12/09/2016   Procedure: CATARACT EXTRACTION PHACO AND INTRAOCULAR LENS PLACEMENT (IOC);  Surgeon: Birder Robson, MD;  Location: [facility]  ORS;  Service: Ophthalmology;  Laterality: Right;  [place] 00:49 AP% 21.2 CDE 10.39 Fluid pack lot # [id number]   CHOLECYSTECTOMY     COLONOSCOPY WITH PROPOFOL N/A 11/22/2014   Procedure: COLONOSCOPY WITH PROPOFOL;  Surgeon: Manya Silvas, MD;  Location: [facility] ENDOSCOPY;  Service: Endoscopy;  Laterality: N/A;   CYSTOSCOPY WITH STENT PLACEMENT Bilateral 11/27/2018   Procedure: CYSTOSCOPY WITH STENT PLACEMENT;  Surgeon: Festus Aloe, MD;  Location: [facility] ORS;  Service: Urology;  Laterality: Bilateral;   CYSTOSCOPY/URETEROSCOPY/HOLMIUM LASER/STENT PLACEMENT Bilateral 12/17/2018   Procedure: CYSTOSCOPY/URETEROSCOPY/HOLMIUM LASER/STENT Exchange;  Surgeon: Billey Co, MD;  Location: [facility] ORS;  Service: Urology;  Laterality: Bilateral;   ESOPHAGOGASTRODUODENOSCOPY  11/22/2014   Procedure: ESOPHAGOGASTRODUODENOSCOPY (EGD);  Surgeon: Manya Silvas, MD;  Location: [facility] ENDOSCOPY;  Service: Endoscopy;;   ESOPHAGOGASTRODUODENOSCOPY (EGD) WITH PROPOFOL N/A 03/05/2017   Procedure: ESOPHAGOGASTRODUODENOSCOPY (EGD) WITH PROPOFOL;  Surgeon: Lin Landsman, MD;  Location: [facility] ENDOSCOPY;  Service: Gastroenterology;  Laterality: N/A;   EXTRACORPOREAL SHOCK WAVE LITHOTRIPSY Right 03/04/2018   Procedure: EXTRACORPOREAL SHOCK WAVE LITHOTRIPSY (ESWL);  Surgeon: Billey Co, MD;  Location: [facility] ORS;  Service: Urology;  Laterality: Right;   EYE SURGERY Bilateral 2012   cataract extraction with iol   GALLBLADDER SURGERY  1968   hemi pelvectomy     left side at age 62   HEMIPELVIC Left [place] Left    AMPUTATION d/t cancer at 89 years old   MASTECTOMY, PARTIAL Left 07/27/2017   Procedure: MASTECTOMY PARTIAL;  Surgeon: Robert Bellow, MD;  Location: [facility] ORS;  Service: General;  Laterality: Left;   MOUTH SURGERY  2019   teeth pulled with a bridge insertion.   fell out 12/16/18   OVARY SURGERY Right    cyst removed   SAVORY DILATION  11/22/2014   Procedure: SAVORY DILATION;  Surgeon:  Manya Silvas, MD;  Location: [facility] ENDOSCOPY;  Service: Endoscopy;;   SENTINEL NODE BIOPSY Left 07/27/2017   Procedure: SENTINEL NODE BIOPSY;  Surgeon: Robert Bellow, MD;  Location: [facility] ORS;  Service: General;  Laterality: Left;   SKIN CANCER EXCISION     nose and arm and face   TEE WITHOUT CARDIOVERSION N/A 07/25/2019   Procedure: TRANSESOPHAGEAL ECHOCARDIOGRAM (TEE);  Surgeon: Corey Skains, MD;  Location: [facility] ORS;  Service: Cardiovascular;  Laterality: N/A;   TONSILLECTOMY      Allergies  Allergen Reactions   Ciprofloxacin Hives   Codeine Other (See Comments)    HALLUCINATIONS   Tape     Rash and skin irritation/ paper tape and tegaderm OK   Ciprocinonide [Fluocinolone] Other (See Comments)    unknown   Amoxicillin Other (See Comments)  Upset stomach Has patient had a PCN reaction causing immediate rash, facial/tongue/throat swelling, SOB or lightheadedness with hypotension: No Has patient had a PCN reaction causing severe rash involving mucus membranes or skin necrosis: No Has patient had a PCN reaction that required hospitalization: No Has patient had a PCN reaction occurring within the last 10 years: Yes If all of the above answers are "NO", then may proceed with Cephalosporin use.;   Cefuroxime Other (See Comments)    OK INTRACAMERALLY PER DR WLP, upset stomach   Latex Rash    Rast test NEGATIVE   Lipitor [Atorvastatin] Other (See Comments)    unknown    Outpatient Encounter Medications as of 07/21/2022  Medication Sig   acetaminophen (TYLENOL) 500 MG tablet Take 500 mg by mouth every 6 (six) hours as needed.   acetaminophen (TYLENOL) 650 MG CR tablet Take 650 mg by mouth every 8 (eight) hours as needed for pain.   Biotin 10000 MCG TABS Take 10,000 mcg by mouth at bedtime.   dextromethorphan-guaiFENesin (ROBITUSSIN-DM) 10-100 MG/5ML liquid Take 10 mLs by mouth every 4 (four) hours as needed for cough.   diclofenac Sodium (VOLTAREN) 1 % GEL Apply 4 g  topically. Apply to stump topically every 6 hours as needed. Apply to right knee topically three times daily for pain.   gabapentin (NEURONTIN) 100 MG capsule Take 100 mg by mouth 3 (three) times daily.   GLIPIZIDE XL 2.5 MG 24 hr tablet Take 2.5 mg by mouth daily.    hydrocortisone 2.5 % cream Apply to Hemorrhoids/buttucks topically every 8 hours as needed for hemorrhoids.   hydrocortisone cream 1 % Apply to rectum topically every 6 hours as needed   hydrOXYzine (VISTARIL) 25 MG capsule Take 25 mg by mouth every 12 (twelve) hours as needed.   Infant Care Products (DERMACLOUD) OINT Apply to Sacrum topically every shift for prevention   letrozole (FEMARA) 2.5 MG tablet TAKE ONE TABLET BY MOUTH EVERY DAY   loratadine (CLARITIN) 10 MG tablet Take 10 mg by mouth daily.   melatonin 3 MG TABS tablet Take 3 mg by mouth at bedtime.   metFORMIN (GLUCOPHAGE) 500 MG tablet Take 750 mg by mouth 2 (two) times daily with a meal.   metoprolol tartrate (LOPRESSOR) 25 MG tablet Take 1 tablet (25 mg total) by mouth 3 (three) times daily.   Multiple Vitamin (MULTIVITAMIN WITH MINERALS) TABS tablet Take 1 tablet by mouth daily.   omeprazole (PRILOSEC) 40 MG capsule Take 40 mg by mouth daily.    ondansetron (ZOFRAN) 4 MG tablet Take 4 mg by mouth every 4 (four) hours as needed for nausea or vomiting.   OXYGEN 2lpm as needed for sats less than 88% every 1 hour as needed   PARoxetine (PAXIL) 30 MG tablet Take 30 mg by mouth daily.   Polyethyl Glycol-Propyl Glycol 0.4-0.3 % SOLN Apply to eye 2 (two) times daily as needed.   polyethylene glycol (MIRALAX / GLYCOLAX) 17 g packet Take 17 g by mouth daily as needed.   PRADAXA 150 MG CAPS capsule TAKE 1 CAPSULE BY MOUTH TWICE DAILY START TREATMENT 10 HRS LAST DOSE OF LOVENOX   tiZANidine (ZANAFLEX) 2 MG tablet Take 2 mg by mouth every 12 (twelve) hours as needed for muscle spasms.   [DISCONTINUED] ALPRAZolam (XANAX) 0.25 MG tablet Take 1 tablet (0.25 mg total) by mouth once  as needed for up to 1 dose for anxiety (Before MRI).   No facility-administered encounter medications on file as of 07/21/2022.  Review of Systems  Immunization History  Administered Date(s) Administered   Influenza-Unspecified 02/03/2020, 02/18/2021, 02/18/2022   Pneumococcal Conjugate-13 09/28/2017   Unspecified SARS-COV-2 Vaccination 05/20/2019, 06/17/2019, 03/16/2020, 09/20/2020, 01/25/2021   Pertinent  Health Maintenance Due  Topic Date Due   FOOT EXAM  11/16/2020   OPHTHALMOLOGY EXAM  07/31/2023 (Originally 04/29/1944)   HEMOGLOBIN A1C  11/27/2022   INFLUENZA VACCINE  Completed   DEXA SCAN  Completed      12/18/2021    9:00 AM 12/18/2021    7:45 PM 12/19/2021    7:00 AM 12/19/2021   12:00 PM 01/24/2022   11:20 AM  Fall Risk  (RETIRED) Patient Fall Risk Level High fall risk High fall risk High fall risk High fall risk High fall risk   Functional Status Survey:    Vitals:   07/21/22 1114  BP: 118/64  Pulse: 68  Resp: 20  Temp: 97.6 F (36.4 C)  SpO2: 92%  Weight: 159 lb 12.8 oz (72.5 kg)  Height: 5\' 3"  (1.6 m)   Body mass index is 28.31 kg/m. Physical Exam  Labs reviewed: Recent Labs    12/17/21 0533 12/18/21 1207 12/19/21 0621 01/24/22 1051 01/24/22 1157 01/31/22 1053 03/05/22 0000 06/26/22 0000  NA 136  --  139 143  --  137 141 137  K 3.5  --  3.6 4.7  --  4.8 4.3 4.9  CL 109  --  112* 107  --  110 103 102  CO2 20*  --  20* 31  --  23 29* 28*  GLUCOSE 199*  --  243* 198*  --  236*  --   --   BUN 28*  --  21 22  --  22 18 23*  CREATININE 0.61  --  0.53 0.69  --  0.74 0.5 0.5  CALCIUM 9.5  --  9.4 11.8*   < > 9.7 10.7 11.1*  MG 1.5* 1.8  --   --   --   --   --   --    < > = values in this interval not displayed.   Recent Labs    12/15/21 1011 12/17/21 0533 03/05/22 0000 06/26/22 0000  AST 33 25 18 19   ALT 17 26 10 13   ALKPHOS 43 45 65 58  BILITOT 0.5 0.5  --   --   PROT 6.8 6.0*  --   --   ALBUMIN 3.3* 2.7* 4.0 3.8   Recent Labs     12/16/21 0529 12/17/21 0533 01/24/22 1051 03/05/22 0000 06/26/22 0000 07/10/22 0000  WBC 12.3* 8.6 9.3 8.6 8.5 9.2  NEUTROABS  --   --  5.9 5,616.00 5,347.00  --   HGB 10.5* 10.9* 12.2 13.1 13.8 14.1  HCT 34.8* 36.4 41.0 42 43 44  MCV 81.7 81.3 86.0  --   --   --   PLT 127* 125* 218 205 173 165   Lab Results  Component Value Date   TSH 0.12 (A) 06/26/2022   Lab Results  Component Value Date   HGBA1C 7.4 05/29/2022   Lab Results  Component Value Date   CHOL 110 12/15/2021   HDL 19 (L) 12/15/2021   LDLCALC 46 12/15/2021   TRIG 223 (H) 12/15/2021   CHOLHDL 5.8 12/15/2021    Significant Diagnostic Results in last 30 days:  CT CHEST ABDOMEN PELVIS W CONTRAST  Result Date: 06/29/2022 CLINICAL DATA:  History of breast cancer, evaluate for metastatic disease, new hypercalcemia * Tracking Code: BO *  EXAM: CT CHEST, ABDOMEN, AND PELVIS WITH CONTRAST TECHNIQUE: Multidetector CT imaging of the chest, abdomen and pelvis was performed following the standard protocol during bolus administration of intravenous contrast. RADIATION DOSE REDUCTION: This exam was performed according to the departmental dose-optimization program which includes automated exposure control, adjustment of the mA and/or kV according to patient size and/or use of iterative reconstruction technique. CONTRAST:  163mL OMNIPAQUE IOHEXOL 300 MG/ML  SOLN COMPARISON:  12/15/2021 FINDINGS: CT CHEST FINDINGS Cardiovascular: Aortic atherosclerosis. Normal heart size. Left and right coronary artery calcifications. No pericardial effusion. Mediastinum/Nodes: No enlarged mediastinal, hilar, or axillary lymph nodes. Thyroid gland, trachea, and esophagus demonstrate no significant findings. Lungs/Pleura: Similar-appearing atelectasis or consolidation of the left lung base (series 4, image 81). Multiple small pulmonary nodules unchanged, for example in the peripheral anterior right lower lobe measuring 0.8 cm (series 4, image 65), in the  anteromedial segment right middle lobe measuring 0.6 cm (series 4, image 65), in the peripheral lingula measuring 0.4 cm (series 4, image 49) and in the posterior right upper lobe measuring 0.6 cm (series 4, image 32). No pleural effusion or pneumothorax. Musculoskeletal: Postoperative findings of prior left lumpectomy (series 2, image 46). No acute osseous findings. CT ABDOMEN PELVIS FINDINGS Hepatobiliary: No focal liver abnormality is seen. Status post cholecystectomy. No biliary dilatation. Pancreas: Unremarkable. No pancreatic ductal dilatation or surrounding inflammatory changes. Spleen: Normal in size without significant abnormality. Adrenals/Urinary Tract: Adrenal glands are unremarkable. Multiple small bilateral nonobstructive renal calculi. No ureteral calculi or hydronephrosis. Simple benign bilateral renal cortical cysts, for which no further follow-up or characterization is required. Bladder is unremarkable. Stomach/Bowel: Stomach is within normal limits. Appendix is not clearly visualized and may be surgically absent. No evidence of bowel wall thickening, distention, or inflammatory changes. Descending and sigmoid diverticulosis. Vascular/Lymphatic: Aortic atherosclerosis. No enlarged abdominal or pelvic lymph nodes. Reproductive: Calcified fibroids. Unchanged left ovarian cyst measuring 2.0 x 1.6 cm, stable and benign, for which no further follow-up or characterization is required. No follow-up imaging recommended. Note: This recommendation does not apply to premenarchal patients and to those with increased risk (genetic, family history, elevated tumor markers or other high-risk factors) of ovarian cancer. Reference: JACR 2020 Feb; 17(2):248-254 Other: No abdominal wall hernia or abnormality. No ascites. Musculoskeletal: No acute osseous findings. Status post left hemipelvectomy and lower extremity disarticulation. IMPRESSION: 1. Multiple unchanged small bilateral pulmonary nodules. Given  well-established stability these are almost certainly benign and incidental however metastatic disease is not strictly excluded in the setting of known breast malignancy. No new nodules. 2. No evidence of lymphadenopathy or metastatic disease in the chest, abdomen, or pelvis. 3. Status post left hemipelvectomy and lower extremity disarticulation. 4. Bilateral nonobstructive nephrolithiasis. 5. Coronary artery disease. Aortic Atherosclerosis (ICD10-I70.0). Electronically Signed   By: Delanna Ahmadi M.D.   On: 06/29/2022 16:15    Assessment/Plan Confusion  Hematuria, unspecified type Patient with some increased confusion in the setting of positive covid test on isolation. 2nd time with hematuria. Plan to hold Pradaxa for 48 hours. Culture negative for isolated bacteria. Plan to start trazodone 12.5 mg q6 hours prn for agitation/anxiety for 14 days. Continue GOC regarding continue pradaxa.   Family/ staff Communication: nursing  Labs/tests ordered:  CBC

## 2022-07-24 ENCOUNTER — Encounter: Payer: Self-pay | Admitting: Nurse Practitioner

## 2022-07-24 ENCOUNTER — Non-Acute Institutional Stay (SKILLED_NURSING_FACILITY): Payer: Medicare Other | Admitting: Nurse Practitioner

## 2022-07-24 DIAGNOSIS — U071 COVID-19: Secondary | ICD-10-CM | POA: Diagnosis not present

## 2022-07-24 DIAGNOSIS — I480 Paroxysmal atrial fibrillation: Secondary | ICD-10-CM

## 2022-07-24 DIAGNOSIS — R310 Gross hematuria: Secondary | ICD-10-CM | POA: Diagnosis not present

## 2022-07-24 NOTE — Progress Notes (Signed)
Location:  Other [place].  Nursing Home Room Number: [place] of Service:  SNF [phone]) Provider:  Sherrie Mustache, NP  PCP: Dewayne Shorter, MD  Patient Care Team: Dewayne Shorter, MD as PCP - General (Family Medicine) Bary Castilla Forest Gleason, MD (General Surgery) Lloyd Huger, MD as Consulting Physician (Oncology)  Extended Emergency Contact Information Primary Emergency Contact: Elwin Mocha Address: [address]          [place], [place] [address] [name] Phone: [phone] Mobile Phone: [phone] Relation: Daughter  Code Status:  DNR Goals of care: Advanced Directive information    07/24/2022    9:13 AM  Advanced Directives  Does Patient Have a Medical Advance Directive? Yes  Type of Advance Directive Out of facility DNR (pink MOST or yellow form)  Does patient want to make changes to medical advance directive? No - Patient declined     Chief Complaint  Patient presents with   Acute Visit    Hematuria    HPI:  Pt is a 89 y.o. female seen today for an acute visit for ongoing hematuria Pt was noted to have hematuria and pradaxa was held while UA C&S was sent off.  She is not having any burning or pain with urination.  Hematuria improved however once pradaxa restarted blood in the urine returned.  She is not having any dizziness, lightheadedness.   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   She was diagnosised with COVID on 07/12/22. She is having ongoing cough but no worsening shortness of breath, fever or chills.   Reports she is having more anxiety since she has not been able to leave her room.     Past Medical History:  Diagnosis Date   Anxiety    Benign breast lumps    Blood clotting disorder (HCC)    Bone cancer (HCC)    head of femur, left leg ... amputation at age 25   Breast cancer of upper-outer quadrant of left female breast ([place]) 07/27/2017   11 mm invasive mammary carcinoma, ER  90%, PR 51-90%, negative margins.  Oncotype recurrence score: 1.  DCIS, margin less than 0.5 mm.  Negative sentinel node.  Accelerated partial breast radiation.   Cervicalgia    Chronic kidney disease    kidney stones   Colon cancer [facility])    patient unaware of this   Complication of anesthesia    mood alteration / not sure if d/t pain medicine or anesthesia as she passed out    Cough    NASAL DRIP / SNEEZING / SORE THROAT MOSTLY CONSTANT   Depression    Diabetes ([place])    Diverticulitis    Dizzy    GERD (gastroesophageal reflux disease)    H/O blood clots    arm and leg   HBP (high blood pressure)    Hematuria    gross   Hemorrhoids    HLD (hyperlipidemia)    Microscopic hematuria 06/02/2015   Muscle pain    Osteoarthritis    Osteoporosis    Panic disorder    Personal history of radiation therapy    PND (post-nasal drip)    CHRONIC WITH SORE THROAT AND SNEEZING   Reflux    Sepsis ([place])    Sepsis, unspecified organism ([place]) 12/15/2021   Formatting of this note might be different from the original.  Last Assessment & Plan: Formatting of this note might be different from the original. Appears  to be multifactorial and secondary to healthcare associated pneumonia as well as a UTI CT angio shows asymmetric airspace disease at the left base and posteriorly in the right upper lobe concerning for pneumonia Patient also has pyuria Patient   Severe sepsis ([place]) 12/15/2021   Skin cancer    nose   Swelling    Tremor    Tremors of nervous system    Ureteral stone with hydronephrosis 11/26/2018   Past Surgical History:  Procedure Laterality Date   APPENDECTOMY  1962   BREAST BIOPSY Right 2006?   benign   BREAST BIOPSY Left 2019   invasive mammary carcinoma   BREAST CYST EXCISION Left 07/27/2017   Procedure: SKIN CYST EXCISED;  Surgeon: Robert Bellow, MD;  Location: [facility] ORS;  Service: General;  Laterality: Left;   BREAST LUMPECTOMY Left 07/2017   invasive mammary, DCIS   mammosite   BREAST SURGERY Right 2019   CARPAL TUNNEL RELEASE Right    CATARACT EXTRACTION W/PHACO Left 11/11/2016   Procedure: CATARACT EXTRACTION PHACO AND INTRAOCULAR LENS PLACEMENT ([place]);  Surgeon: Birder Robson, MD;  Location: [facility] ORS;  Service: Ophthalmology;  Laterality: Left;  [place] 01:07.9 AP% 19.2 CDE 13.02 Fluid pack lot # [id number]   CATARACT EXTRACTION W/PHACO Right 12/09/2016   Procedure: CATARACT EXTRACTION PHACO AND INTRAOCULAR LENS PLACEMENT (IOC);  Surgeon: Birder Robson, MD;  Location: [facility] ORS;  Service: Ophthalmology;  Laterality: Right;  [place] 00:49 AP% 21.2 CDE 10.39 Fluid pack lot # [id number]   CHOLECYSTECTOMY     COLONOSCOPY WITH PROPOFOL N/A 11/22/2014   Procedure: COLONOSCOPY WITH PROPOFOL;  Surgeon: Manya Silvas, MD;  Location: [facility] ENDOSCOPY;  Service: Endoscopy;  Laterality: N/A;   CYSTOSCOPY WITH STENT PLACEMENT Bilateral 11/27/2018   Procedure: CYSTOSCOPY WITH STENT PLACEMENT;  Surgeon: Festus Aloe, MD;  Location: [facility] ORS;  Service: Urology;  Laterality: Bilateral;   CYSTOSCOPY/URETEROSCOPY/HOLMIUM LASER/STENT PLACEMENT Bilateral 12/17/2018   Procedure: CYSTOSCOPY/URETEROSCOPY/HOLMIUM LASER/STENT Exchange;  Surgeon: Billey Co, MD;  Location: [facility] ORS;  Service: Urology;  Laterality: Bilateral;   ESOPHAGOGASTRODUODENOSCOPY  11/22/2014   Procedure: ESOPHAGOGASTRODUODENOSCOPY (EGD);  Surgeon: Manya Silvas, MD;  Location[facility] ENDOSCOPY;  Service: Endoscopy;;   ESOPHAGOGASTRODUODENOSCOPY (EGD) WITH PROPOFOL N/A 03/05/2017   Procedure: ESOPHAGOGASTRODUODENOSCOPY (EGD) WITH PROPOFOL;  Surgeon: Lin Landsman, MD;  Location: [facility] ENDOSCOPY;  Service: Gastroenterology;  Laterality: N/A;   EXTRACORPOREAL SHOCK WAVE LITHOTRIPSY Right 03/04/2018   Procedure: EXTRACORPOREAL SHOCK WAVE LITHOTRIPSY (ESWL);  Surgeon: Billey Co, MD;  Location: [facility] ORS;  Service: Urology;  Laterality: Right;   EYE SURGERY Bilateral 2012   cataract extraction with  iol   GALLBLADDER SURGERY  1968   hemi pelvectomy     left side at age 58   HEMIPELVIC Left [place] Left    AMPUTATION d/t cancer at 89 years old   MASTECTOMY, PARTIAL Left 07/27/2017   Procedure: MASTECTOMY PARTIAL;  Surgeon: Robert Bellow, MD;  Location: [facility] ORS;  Service: General;  Laterality: Left;   MOUTH SURGERY  2019   teeth pulled with a bridge insertion.   fell out 12/16/18   OVARY SURGERY Right    cyst removed   SAVORY DILATION  11/22/2014   Procedure: SAVORY DILATION;  Surgeon: Manya Silvas, MD;  Location: [facility] ENDOSCOPY;  Service: Endoscopy;;   SENTINEL NODE BIOPSY Left 07/27/2017   Procedure: SENTINEL NODE BIOPSY;  Surgeon: Robert Bellow, MD;  Location: [facility] ORS;  Service: General;  Laterality: Left;   SKIN CANCER EXCISION  nose and arm and face   TEE WITHOUT CARDIOVERSION N/A 07/25/2019   Procedure: TRANSESOPHAGEAL ECHOCARDIOGRAM (TEE);  Surgeon: Corey Skains, MD;  Location: [facility] ORS;  Service: Cardiovascular;  Laterality: N/A;   TONSILLECTOMY      Allergies  Allergen Reactions   Ciprofloxacin Hives   Codeine Other (See Comments)    HALLUCINATIONS   Tape     Rash and skin irritation/ paper tape and tegaderm OK   Ciprocinonide [Fluocinolone] Other (See Comments)    unknown   Amoxicillin Other (See Comments)    Upset stomach Has patient had a PCN reaction causing immediate rash, facial/tongue/throat swelling, SOB or lightheadedness with hypotension: No Has patient had a PCN reaction causing severe rash involving mucus membranes or skin necrosis: No Has patient had a PCN reaction that required hospitalization: No Has patient had a PCN reaction occurring within the last 10 years: Yes If all of the above answers are "NO", then may proceed with Cephalosporin use.;   Cefuroxime Other (See Comments)    OK INTRACAMERALLY PER DR WLP, upset stomach   Latex Rash    Rast test NEGATIVE   Lipitor [Atorvastatin] Other (See Comments)    unknown     Outpatient Encounter Medications as of 07/24/2022  Medication Sig   acetaminophen (TYLENOL) 500 MG tablet Take 500 mg by mouth every 6 (six) hours as needed.   acetaminophen (TYLENOL) 650 MG CR tablet Take 650 mg by mouth every 8 (eight) hours as needed for pain.   Biotin 10000 MCG TABS Take 10,000 mcg by mouth at bedtime.   dextromethorphan-guaiFENesin (ROBITUSSIN-DM) 10-100 MG/5ML liquid Take 10 mLs by mouth every 4 (four) hours as needed for cough.   diclofenac Sodium (VOLTAREN) 1 % GEL Apply 4 g topically. Apply to stump topically every 6 hours as needed. Apply to right knee topically three times daily for pain.   gabapentin (NEURONTIN) 100 MG capsule Take 100 mg by mouth 3 (three) times daily.   GLIPIZIDE XL 2.5 MG 24 hr tablet Take 2.5 mg by mouth daily.    hydrocortisone 2.5 % cream Apply to Hemorrhoids/buttucks topically every 8 hours as needed for hemorrhoids.   hydrocortisone cream 1 % Apply to rectum topically every 6 hours as needed   Infant Care Products (DERMACLOUD) OINT Apply to Sacrum topically every shift for prevention   letrozole (FEMARA) 2.5 MG tablet TAKE ONE TABLET BY MOUTH EVERY DAY   loratadine (CLARITIN) 10 MG tablet Take 10 mg by mouth daily.   melatonin 3 MG TABS tablet Take 3 mg by mouth at bedtime.   metFORMIN (GLUCOPHAGE) 500 MG tablet Take 750 mg by mouth 2 (two) times daily with a meal.   metoprolol tartrate (LOPRESSOR) 25 MG tablet Take 1 tablet (25 mg total) by mouth 3 (three) times daily.   Multiple Vitamin (MULTIVITAMIN WITH MINERALS) TABS tablet Take 1 tablet by mouth daily.   omeprazole (PRILOSEC) 40 MG capsule Take 40 mg by mouth daily.    ondansetron (ZOFRAN) 4 MG tablet Take 4 mg by mouth every 4 (four) hours as needed for nausea or vomiting.   OXYGEN 2lpm as needed for sats less than 88% every 1 hour as needed   PARoxetine (PAXIL) 30 MG tablet Take 30 mg by mouth daily.   Polyethyl Glycol-Propyl Glycol 0.4-0.3 % SOLN Apply to eye 2 (two) times  daily as needed.   polyethylene glycol (MIRALAX / GLYCOLAX) 17 g packet Take 17 g by mouth daily as needed.   PRADAXA 150  MG CAPS capsule TAKE 1 CAPSULE BY MOUTH TWICE DAILY START TREATMENT 10 HRS LAST DOSE OF LOVENOX   tiZANidine (ZANAFLEX) 2 MG tablet Take 2 mg by mouth every 12 (twelve) hours as needed for muscle spasms.   traZODone (DESYREL) 50 MG tablet Take 12.5 mg by mouth every 8 (eight) hours as needed (Anxiety/Agitation).   [DISCONTINUED] hydrOXYzine (VISTARIL) 25 MG capsule Take 25 mg by mouth every 12 (twelve) hours as needed.   No facility-administered encounter medications on file as of 07/24/2022.    Review of Systems  Immunization History  Administered Date(s) Administered   Influenza-Unspecified 02/03/2020, 02/18/2021, 02/18/2022   Pneumococcal Conjugate-13 09/28/2017   Unspecified SARS-COV-2 Vaccination 05/20/2019, 06/17/2019, 03/16/2020, 09/20/2020, 01/25/2021   Pertinent  Health Maintenance Due  Topic Date Due   OPHTHALMOLOGY EXAM  07/31/2023 (Originally 04/29/1944)   FOOT EXAM  09/11/2022   HEMOGLOBIN A1C  11/27/2022   INFLUENZA VACCINE  Completed   DEXA SCAN  Completed      12/18/2021    9:00 AM 12/18/2021    7:45 PM 12/19/2021    7:00 AM 12/19/2021   12:00 PM 01/24/2022   11:20 AM  Fall Risk  (RETIRED) Patient Fall Risk Level High fall risk High fall risk High fall risk High fall risk High fall risk   Functional Status Survey:    Vitals:   07/24/22 0850  BP: (!) 106/48  Pulse: 87  Resp: 20  Temp: 97.6 F (36.4 C)  SpO2: 90%  Weight: 159 lb 12.8 oz (72.5 kg)  Height: 5\' 3"  (1.6 m)   Body mass index is 28.31 kg/m. Physical Exam  Labs reviewed: Recent Labs    12/17/21 0533 12/18/21 1207 12/19/21 0621 01/24/22 1051 01/24/22 1157 01/31/22 1053 03/05/22 0000 06/26/22 0000  NA 136  --  139 143  --  137 141 137  K 3.5  --  3.6 4.7  --  4.8 4.3 4.9  CL 109  --  112* 107  --  110 103 102  CO2 20*  --  20* 31  --  23 29* 28*  GLUCOSE 199*  --   243* 198*  --  236*  --   --   BUN 28*  --  21 22  --  22 18 23*  CREATININE 0.61  --  0.53 0.69  --  0.74 0.5 0.5  CALCIUM 9.5  --  9.4 11.8*   < > 9.7 10.7 11.1*  MG 1.5* 1.8  --   --   --   --   --   --    < > = values in this interval not displayed.   Recent Labs    12/15/21 1011 12/17/21 0533 03/05/22 0000 06/26/22 0000  AST 33 25 18 19   ALT 17 26 10 13   ALKPHOS 43 45 65 58  BILITOT 0.5 0.5  --   --   PROT 6.8 6.0*  --   --   ALBUMIN 3.3* 2.7* 4.0 3.8   Recent Labs    12/16/21 0529 12/17/21 0533 01/24/22 1051 03/05/22 0000 06/26/22 0000 07/10/22 0000  WBC 12.3* 8.6 9.3 8.6 8.5 9.2  NEUTROABS  --   --  5.9 5,616.00 5,347.00  --   HGB 10.5* 10.9* 12.2 13.1 13.8 14.1  HCT 34.8* 36.4 41.0 42 43 44  MCV 81.7 81.3 86.0  --   --   --   PLT 127* 125* 218 205 173 165   Lab Results  Component Value Date  TSH 0.12 (A) 06/26/2022   Lab Results  Component Value Date   HGBA1C 7.4 05/29/2022   Lab Results  Component Value Date   CHOL 110 12/15/2021   HDL 19 (L) 12/15/2021   LDLCALC 46 12/15/2021   TRIG 223 (H) 12/15/2021   CHOLHDL 5.8 12/15/2021    Significant Diagnostic Results in last 30 days:  CT CHEST ABDOMEN PELVIS W CONTRAST  Result Date: 06/29/2022 CLINICAL DATA:  History of breast cancer, evaluate for metastatic disease, new hypercalcemia * Tracking Code: BO * EXAM: CT CHEST, ABDOMEN, AND PELVIS WITH CONTRAST TECHNIQUE: Multidetector CT imaging of the chest, abdomen and pelvis was performed following the standard protocol during bolus administration of intravenous contrast. RADIATION DOSE REDUCTION: This exam was performed according to the departmental dose-optimization program which includes automated exposure control, adjustment of the mA and/or kV according to patient size and/or use of iterative reconstruction technique. CONTRAST:  136mL OMNIPAQUE IOHEXOL 300 MG/ML  SOLN COMPARISON:  12/15/2021 FINDINGS: CT CHEST FINDINGS Cardiovascular: Aortic  atherosclerosis. Normal heart size. Left and right coronary artery calcifications. No pericardial effusion. Mediastinum/Nodes: No enlarged mediastinal, hilar, or axillary lymph nodes. Thyroid gland, trachea, and esophagus demonstrate no significant findings. Lungs/Pleura: Similar-appearing atelectasis or consolidation of the left lung base (series 4, image 81). Multiple small pulmonary nodules unchanged, for example in the peripheral anterior right lower lobe measuring 0.8 cm (series 4, image 65), in the anteromedial segment right middle lobe measuring 0.6 cm (series 4, image 65), in the peripheral lingula measuring 0.4 cm (series 4, image 49) and in the posterior right upper lobe measuring 0.6 cm (series 4, image 32). No pleural effusion or pneumothorax. Musculoskeletal: Postoperative findings of prior left lumpectomy (series 2, image 46). No acute osseous findings. CT ABDOMEN PELVIS FINDINGS Hepatobiliary: No focal liver abnormality is seen. Status post cholecystectomy. No biliary dilatation. Pancreas: Unremarkable. No pancreatic ductal dilatation or surrounding inflammatory changes. Spleen: Normal in size without significant abnormality. Adrenals/Urinary Tract: Adrenal glands are unremarkable. Multiple small bilateral nonobstructive renal calculi. No ureteral calculi or hydronephrosis. Simple benign bilateral renal cortical cysts, for which no further follow-up or characterization is required. Bladder is unremarkable. Stomach/Bowel: Stomach is within normal limits. Appendix is not clearly visualized and may be surgically absent. No evidence of bowel wall thickening, distention, or inflammatory changes. Descending and sigmoid diverticulosis. Vascular/Lymphatic: Aortic atherosclerosis. No enlarged abdominal or pelvic lymph nodes. Reproductive: Calcified fibroids. Unchanged left ovarian cyst measuring 2.0 x 1.6 cm, stable and benign, for which no further follow-up or characterization is required. No follow-up  imaging recommended. Note: This recommendation does not apply to premenarchal patients and to those with increased risk (genetic, family history, elevated tumor markers or other high-risk factors) of ovarian cancer. Reference: JACR 2020 Feb; 17(2):248-254 Other: No abdominal wall hernia or abnormality. No ascites. Musculoskeletal: No acute osseous findings. Status post left hemipelvectomy and lower extremity disarticulation. IMPRESSION: 1. Multiple unchanged small bilateral pulmonary nodules. Given well-established stability these are almost certainly benign and incidental however metastatic disease is not strictly excluded in the setting of known breast malignancy. No new nodules. 2. No evidence of lymphadenopathy or metastatic disease in the chest, abdomen, or pelvis. 3. Status post left hemipelvectomy and lower extremity disarticulation. 4. Bilateral nonobstructive nephrolithiasis. 5. Coronary artery disease. Aortic Atherosclerosis (ICD10-I70.0). Electronically Signed   By: Delanna Ahmadi M.D.   On: 06/29/2022 16:15    Assessment/Plan 1. Gross hematuria -noted without infection on C&S.  -will get cbc to follow hgb. -discussed risk vs benefit of holding  pradaxa with her hx of DVTs- daughter and pt agree to continue pradaxa for now and will monitor hgb.   2. COVID-19 Symptoms have improved. Continues to have symptoms of cough and congestion -will add mucinex DM by mouth BID for 1 week -to make sure to maintain hydration  3. Paroxysmal atrial fibrillation (HCC) -rate controlled, continues on pradaxa due to hx of clotting disorder.    Family/ staff Communication: spoke with daughter and unit nurse.  Labs/tests ordered:  cbc   Fremon Zacharia K. Prim[place], [facility] [phone]

## 2022-07-25 ENCOUNTER — Inpatient Hospital Stay: Payer: Medicare Other | Admitting: Nurse Practitioner

## 2022-07-25 ENCOUNTER — Inpatient Hospital Stay: Payer: Medicare Other

## 2022-07-31 ENCOUNTER — Telehealth: Payer: Self-pay

## 2022-07-31 DIAGNOSIS — L988 Other specified disorders of the skin and subcutaneous tissue: Secondary | ICD-10-CM

## 2022-07-31 NOTE — Telephone Encounter (Signed)
Called nursing home to inform them that patient indeed needs MRI to evaluate for fistula. They plan to call and have the MRI rescheduled.

## 2022-07-31 NOTE — Telephone Encounter (Signed)
[facility] called stating that patient is scheduled for MRI tomorrow 08/01/2022 and order was not signed. Ronda Kinley(office administrative) called Dr. Jordan Hawks Beamer(ordering provider) and informed her. She stated that order was supposed to be cancelled by the SNF at [facility].  I called and spoke with Maudie Mercury at [facility] and informed her that appointment needs to cancelled.  Message routed to Dr. Dewayne Shorter just as Juluis Rainier

## 2022-08-01 ENCOUNTER — Ambulatory Visit: Admission: RE | Admit: 2022-08-01 | Payer: Medicare Other | Source: Ambulatory Visit

## 2022-08-04 ENCOUNTER — Inpatient Hospital Stay: Payer: Medicare Other | Attending: Oncology

## 2022-08-04 ENCOUNTER — Encounter: Payer: Self-pay | Admitting: Nurse Practitioner

## 2022-08-04 ENCOUNTER — Inpatient Hospital Stay: Payer: Medicare Other

## 2022-08-04 ENCOUNTER — Inpatient Hospital Stay (HOSPITAL_BASED_OUTPATIENT_CLINIC_OR_DEPARTMENT_OTHER): Payer: Medicare Other | Admitting: Nurse Practitioner

## 2022-08-04 VITALS — BP 129/50 | HR 56 | Temp 96.6°F

## 2022-08-04 DIAGNOSIS — Z08 Encounter for follow-up examination after completed treatment for malignant neoplasm: Secondary | ICD-10-CM

## 2022-08-04 DIAGNOSIS — F419 Anxiety disorder, unspecified: Secondary | ICD-10-CM | POA: Insufficient documentation

## 2022-08-04 DIAGNOSIS — Z170 Estrogen receptor positive status [ER+]: Secondary | ICD-10-CM | POA: Diagnosis not present

## 2022-08-04 DIAGNOSIS — N189 Chronic kidney disease, unspecified: Secondary | ICD-10-CM | POA: Insufficient documentation

## 2022-08-04 DIAGNOSIS — Z87442 Personal history of urinary calculi: Secondary | ICD-10-CM | POA: Diagnosis not present

## 2022-08-04 DIAGNOSIS — E1122 Type 2 diabetes mellitus with diabetic chronic kidney disease: Secondary | ICD-10-CM | POA: Diagnosis not present

## 2022-08-04 DIAGNOSIS — Z8042 Family history of malignant neoplasm of prostate: Secondary | ICD-10-CM | POA: Diagnosis not present

## 2022-08-04 DIAGNOSIS — Z923 Personal history of irradiation: Secondary | ICD-10-CM | POA: Insufficient documentation

## 2022-08-04 DIAGNOSIS — Z7984 Long term (current) use of oral hypoglycemic drugs: Secondary | ICD-10-CM | POA: Insufficient documentation

## 2022-08-04 DIAGNOSIS — M810 Age-related osteoporosis without current pathological fracture: Secondary | ICD-10-CM | POA: Insufficient documentation

## 2022-08-04 DIAGNOSIS — Z803 Family history of malignant neoplasm of breast: Secondary | ICD-10-CM | POA: Insufficient documentation

## 2022-08-04 DIAGNOSIS — M1990 Unspecified osteoarthritis, unspecified site: Secondary | ICD-10-CM | POA: Diagnosis not present

## 2022-08-04 DIAGNOSIS — Z8041 Family history of malignant neoplasm of ovary: Secondary | ICD-10-CM | POA: Diagnosis not present

## 2022-08-04 DIAGNOSIS — Z79899 Other long term (current) drug therapy: Secondary | ICD-10-CM | POA: Insufficient documentation

## 2022-08-04 DIAGNOSIS — Z853 Personal history of malignant neoplasm of breast: Secondary | ICD-10-CM

## 2022-08-04 DIAGNOSIS — E785 Hyperlipidemia, unspecified: Secondary | ICD-10-CM | POA: Diagnosis not present

## 2022-08-04 DIAGNOSIS — D6851 Activated protein C resistance: Secondary | ICD-10-CM | POA: Diagnosis not present

## 2022-08-04 DIAGNOSIS — K219 Gastro-esophageal reflux disease without esophagitis: Secondary | ICD-10-CM | POA: Insufficient documentation

## 2022-08-04 DIAGNOSIS — C50412 Malignant neoplasm of upper-outer quadrant of left female breast: Secondary | ICD-10-CM | POA: Insufficient documentation

## 2022-08-04 LAB — CBC WITH DIFFERENTIAL/PLATELET
Abs Immature Granulocytes: 0.04 10*3/uL (ref 0.00–0.07)
Basophils Absolute: 0.1 10*3/uL (ref 0.0–0.1)
Basophils Relative: 1 %
Eosinophils Absolute: 0.2 10*3/uL (ref 0.0–0.5)
Eosinophils Relative: 2 %
HCT: 43 % (ref 36.0–46.0)
Hemoglobin: 13.4 g/dL (ref 12.0–15.0)
Immature Granulocytes: 0 %
Lymphocytes Relative: 21 %
Lymphs Abs: 1.8 10*3/uL (ref 0.7–4.0)
MCH: 27.6 pg (ref 26.0–34.0)
MCHC: 31.2 g/dL (ref 30.0–36.0)
MCV: 88.5 fL (ref 80.0–100.0)
Monocytes Absolute: 0.6 10*3/uL (ref 0.1–1.0)
Monocytes Relative: 7 %
Neutro Abs: 6.3 10*3/uL (ref 1.7–7.7)
Neutrophils Relative %: 69 %
Platelets: 229 10*3/uL (ref 150–400)
RBC: 4.86 MIL/uL (ref 3.87–5.11)
RDW: 16.1 % — ABNORMAL HIGH (ref 11.5–15.5)
WBC: 8.9 10*3/uL (ref 4.0–10.5)
nRBC: 0 % (ref 0.0–0.2)

## 2022-08-04 LAB — BASIC METABOLIC PANEL
Anion gap: 7 (ref 5–15)
BUN: 17 mg/dL (ref 8–23)
CO2: 27 mmol/L (ref 22–32)
Calcium: 10.8 mg/dL — ABNORMAL HIGH (ref 8.9–10.3)
Chloride: 105 mmol/L (ref 98–111)
Creatinine, Ser: 0.72 mg/dL (ref 0.44–1.00)
GFR, Estimated: >60 mL/min (ref >60)
Glucose, Bld: 181 mg/dL — ABNORMAL HIGH (ref 70–99)
Potassium: 4 mmol/L (ref 3.5–5.1)
Sodium: 139 mmol/L (ref 135–145)

## 2022-08-04 MED ORDER — DENOSUMAB 60 MG/ML ~~LOC~~ SOSY
60.0000 mg | PREFILLED_SYRINGE | Freq: Once | SUBCUTANEOUS | Status: AC
  Administered 2022-08-04: 60 mg via SUBCUTANEOUS
  Filled 2022-08-04: qty 1

## 2022-08-04 NOTE — Progress Notes (Signed)
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 Oncotype DX score 1. Homozygous factor V Leiden.  INTERVAL HISTORY: Patient is 89 year old female with above history of breast cancer, osteoporosis, and factor V Leiden who returns to clinic for follow up. Continues to avoid calcium rich foods, supplements, and medications. She continues to reside at [facility] with assisted living.  Denies any lethargy, confusion, muscle weakness, palpitations. No interval falls or fractures.   REVIEW OF SYSTEMS:   Review of Systems  Constitutional:  Negative for chills, fever, malaise/fatigue and weight loss.  HENT:  Negative for hearing loss, nosebleeds, sore throat and tinnitus.   Eyes:  Negative for blurred vision and double vision.  Respiratory:  Negative for cough, hemoptysis, shortness of breath and wheezing.   Cardiovascular:  Negative for chest pain, palpitations and leg swelling.  Gastrointestinal:  Negative for abdominal pain, blood in stool, constipation, diarrhea, melena, nausea and vomiting.  Genitourinary:  Negative for dysuria and urgency.  Musculoskeletal:  Negative for back pain, falls, joint pain and myalgias.  Skin:  Negative for itching and rash.  Neurological:  Negative for dizziness, tingling, sensory change, loss of consciousness, weakness and headaches.  Endo/Heme/Allergies:  Negative for environmental allergies. Does not bruise/bleed easily.  Psychiatric/Behavioral:  Negative for depression. The patient is not nervous/anxious and does not have insomnia.   As per HPI. Otherwise,  a complete review of systems is negative.   PAST MEDICAL HISTORY:  Past Medical History:  Diagnosis Date   Anxiety    Benign breast lumps    Blood clotting disorder    Bone cancer    head of femur, left leg ... amputation at age 31   Breast cancer of upper-outer quadrant of left female breast 07/27/2017   11 mm invasive mammary carcinoma, ER 90%, PR 51-90%, negative margins.  Oncotype recurrence score: 1.  DCIS, margin less than 0.5 mm.  Negative sentinel node.  Accelerated partial breast radiation.   Cervicalgia    Chronic kidney disease    kidney stones   Colon cancer    patient unaware of this   Complication of anesthesia    mood alteration / not sure if d/t pain medicine or anesthesia as she passed out    Cough    NASAL DRIP / SNEEZING / SORE THROAT MOSTLY CONSTANT   Depression    Diabetes    Diverticulitis    Dizzy    GERD (gastroesophageal reflux disease)    H/O blood clots    arm and leg   HBP (high blood pressure)    Hematuria    gross   Hemorrhoids    HLD (hyperlipidemia)    Microscopic hematuria 06/02/2015   Muscle pain    Osteoarthritis    Osteoporosis    Panic disorder    Personal history of radiation therapy    PND (post-nasal drip)    CHRONIC WITH SORE THROAT AND SNEEZING   Reflux    Sepsis    Sepsis, unspecified organism 12/15/2021   Formatting of this note might be different from the original.  Last Assessment & Plan: Formatting of this note might be different from the original. Appears  to be multifactorial and secondary to healthcare associated pneumonia as well as a UTI CT angio shows asymmetric airspace disease at the left base and posteriorly in the right upper lobe concerning for pneumonia Patient also has pyuria Patient   Severe sepsis 12/15/2021   Skin cancer    nose   Swelling    Tremor    Tremors of nervous system    Ureteral stone with hydronephrosis 11/26/2018     PAST SURGICAL HISTORY: Past Surgical History:  Procedure Laterality  Date   APPENDECTOMY  1962   BREAST BIOPSY Right 2006?   benign   BREAST BIOPSY Left 2019   invasive mammary carcinoma   BREAST CYST EXCISION Left 07/27/2017   Procedure: SKIN CYST EXCISED;  Surgeon: Robert Bellow, MD;  Location: [facility] ORS;  Service: General;  Laterality: Left;   BREAST LUMPECTOMY Left 07/2017   invasive mammary, DCIS  mammosite   BREAST SURGERY Right 2019   CARPAL TUNNEL RELEASE Right    CATARACT EXTRACTION W/PHACO Left 11/11/2016   Procedure: CATARACT EXTRACTION PHACO AND INTRAOCULAR LENS PLACEMENT ([place]);  Surgeon: Birder Robson, MD;  Location: [facility] ORS;  Service: Ophthalmology;  Laterality: Left;  [place] 01:07.9 AP% 19.2 CDE 13.02 Fluid pack lot # [id number]   CATARACT EXTRACTION W/PHACO Right 12/09/2016   Procedure: CATARACT EXTRACTION PHACO AND INTRAOCULAR LENS PLACEMENT (IOC);  Surgeon: Birder Robson, MD;  Location: [facility];  Service: Ophthalmology;  Laterality: Right;  [place] 00:49 AP% 21.2 CDE 10.39 Fluid pack lot # [id number]   CHOLECYSTECTOMY     COLONOSCOPY WITH PROPOFOL N/A 11/22/2014   Procedure: COLONOSCOPY WITH PROPOFOL;  Surgeon: Manya Silvas, MD;  Location: [facility] ENDOSCOPY;  Service: Endoscopy;  Laterality: N/A;   CYSTOSCOPY WITH STENT PLACEMENT Bilateral 11/27/2018   Procedure: CYSTOSCOPY WITH STENT PLACEMENT;  Surgeon: Festus Aloe, MD;  Location: [facility];  Service: Urology;  Laterality: Bilateral;   CYSTOSCOPY/URETEROSCOPY/HOLMIUM LASER/STENT PLACEMENT Bilateral 12/17/2018   Procedure: CYSTOSCOPY/URETEROSCOPY/HOLMIUM LASER/STENT Exchange;  Surgeon: Billey Co, MD;  Location: [facility];  Service: Urology;  Laterality: Bilateral;   ESOPHAGOGASTRODUODENOSCOPY  11/22/2014   Procedure: ESOPHAGOGASTRODUODENOSCOPY (EGD);  Surgeon: Manya Silvas, MD;  Location: [facility] ENDOSCOPY;  Service: Endoscopy;;   ESOPHAGOGASTRODUODENOSCOPY (EGD) WITH PROPOFOL N/A 03/05/2017   Procedure: ESOPHAGOGASTRODUODENOSCOPY (EGD) WITH PROPOFOL;  Surgeon: Lin Landsman, MD;  Location: [facility] ENDOSCOPY;  Service: Gastroenterology;  Laterality: N/A;   EXTRACORPOREAL SHOCK WAVE LITHOTRIPSY Right 03/04/2018   Procedure: EXTRACORPOREAL SHOCK WAVE LITHOTRIPSY (ESWL);  Surgeon: Billey Co, MD;  Location: [facility] ORS;  Service: Urology;  Laterality: Right;   EYE SURGERY Bilateral 2012   cataract extraction with iol   GALLBLADDER SURGERY  1968   hemi pelvectomy     left side at age 48   HEMIPELVIC Left [place] Left    AMPUTATION d/t cancer at 89 years old   MASTECTOMY, PARTIAL Left 07/27/2017   Procedure: MASTECTOMY PARTIAL;  Surgeon: Robert Bellow, MD;  Location: [facility] OR[facility];  Service: General;  Laterality: Left;   MOUTH SURGERY  2019   teeth pulled with a bridge insertion.   fell out 12/16/18   OVARY SURGERY Right    cyst removed   SAVORY DILATION  11/22/2014   Procedure: SAVORY DILATION;  Surgeon: Manya Silvas, MD;  Location: [facility] ENDOSCOPY;  Service: Endoscopy;;   SENTINEL NODE BIOPSY Left 07/27/2017   Procedure: SENTINEL NODE BIOPSY;  Surgeon: Robert Bellow, MD;  Location: [facility] ORS;  Service: General;  Laterality: Left;  SKIN CANCER EXCISION     nose and arm and face   TEE WITHOUT CARDIOVERSION N/A 07/25/2019   Procedure: TRANSESOPHAGEAL ECHOCARDIOGRAM (TEE);  Surgeon: Corey Skains, MD;  Location: [facility] ORS;  Service: Cardiovascular;  Laterality: N/A;   TONSILLECTOMY      FAMILY HISTORY: Family History  Problem Relation Age of Onset   Breast cancer Paternal Aunt    Ovarian cancer Sister    Prostate cancer Father    Stroke Mother    Kidney cancer Neg Hx    Bladder Cancer Neg Hx     ADVANCED DIRECTIVES (Y/N):  N  HEALTH MAINTENANCE: Social History   Tobacco Use   Smoking status: Never   Smokeless tobacco: Never  Vaping Use   Vaping Use: Never used  Substance Use Topics   Alcohol use: No   Drug use: No     Colonoscopy:  PAP:  Bone density:  Lipid panel:  Allergies  Allergen Reactions   Ciprofloxacin  Hives   Codeine Other (See Comments)    HALLUCINATIONS   Tape     Rash and skin irritation/ paper tape and tegaderm OK   Ciprocinonide [Fluocinolone] Other (See Comments)    unknown   Amoxicillin Other (See Comments)    Upset stomach Has patient had a PCN reaction causing immediate rash, facial/tongue/throat swelling, SOB or lightheadedness with hypotension: No Has patient had a PCN reaction causing severe rash involving mucus membranes or skin necrosis: No Has patient had a PCN reaction that required hospitalization: No Has patient had a PCN reaction occurring within the last 10 years: Yes If all of the above answers are "NO", then may proceed with Cephalosporin use.;   Cefuroxime Other (See Comments)    OK INTRACAMERALLY PER DR WLP, upset stomach   Latex Rash    Rast test NEGATIVE   Lipitor [Atorvastatin] Other (See Comments)    unknown    Current Outpatient Medications  Medication Sig Dispense Refill   acetaminophen (TYLENOL) 500 MG tablet Take 500 mg by mouth every 6 (six) hours as needed.     acetaminophen (TYLENOL) 650 MG CR tablet Take 650 mg by mouth every 8 (eight) hours as needed for pain.     Biotin 10000 MCG TABS Take 10,000 mcg by mouth at bedtime.     dextromethorphan-guaiFENesin (ROBITUSSIN-DM) 10-100 MG/5ML liquid Take 10 mLs by mouth every 4 (four) hours as needed for cough.     diclofenac Sodium (VOLTAREN) 1 % GEL Apply 4 g topically. Apply to stump topically every 6 hours as needed. Apply to right knee topically three times daily for pain.     gabapentin (NEURONTIN) 100 MG capsule Take 100 mg by mouth 3 (three) times daily.     GLIPIZIDE XL 2.5 MG 24 hr tablet Take 2.5 mg by mouth daily.      hydrocortisone 2.5 % cream Apply to Hemorrhoids/buttucks topically every 8 hours as needed for hemorrhoids.     hydrocortisone cream 1 % Apply to rectum topically every 6 hours as needed     Infant Care Products (DERMACLOUD) OINT Apply to Sacrum topically every shift for  prevention     letrozole (FEMARA) 2.5 MG tablet TAKE ONE TABLET BY MOUTH EVERY DAY 90 tablet 3   loratadine (CLARITIN) 10 MG tablet Take 10 mg by mouth daily.     melatonin 3 MG TABS tablet Take 3 mg by mouth at bedtime.     metFORMIN (GLUCOPHAGE) 500 MG tablet Take 750 mg by mouth 2 (  two) times daily with a meal.     metoprolol tartrate (LOPRESSOR) 25 MG tablet Take 1 tablet (25 mg total) by mouth 3 (three) times daily. 60 tablet 1   Multiple Vitamin (MULTIVITAMIN WITH MINERALS) TABS tablet Take 1 tablet by mouth daily.     omeprazole (PRILOSEC) 40 MG capsule Take 40 mg by mouth daily.      ondansetron (ZOFRAN) 4 MG tablet Take 4 mg by mouth every 4 (four) hours as needed for nausea or vomiting.     OXYGEN 2lpm as needed for sats less than 88% every 1 hour as needed     PARoxetine (PAXIL) 30 MG tablet Take 30 mg by mouth daily.     Polyethyl Glycol-Propyl Glycol 0.4-0.3 % SOLN Apply to eye 2 (two) times daily as needed.     polyethylene glycol (MIRALAX / GLYCOLAX) 17 g packet Take 17 g by mouth daily as needed.     PRADAXA 150 MG CAPS capsule TAKE 1 CAPSULE BY MOUTH TWICE DAILY START TREATMENT 10 HRS LAST DOSE OF LOVENOX 60 capsule 2   tiZANidine (ZANAFLEX) 2 MG tablet Take 2 mg by mouth every 12 (twelve) hours as needed for muscle spasms.     traZODone (DESYREL) 50 MG tablet Take 12.5 mg by mouth every 8 (eight) hours as needed (Anxiety/Agitation).     No current facility-administered medications for this visit.    OBJECTIVE: Vitals:   08/04/22 1056  BP: (!) 129/50  Pulse: (!) 56  Temp: (!) 96.6 F (35.9 C)  SpO2: 95%      There is no height or weight on file to calculate BMI.    ECOG FS:1 - Symptomatic but completely ambulatory  General: Well-developed, well-nourished, no acute distress.  Sitting in a wheelchair. Accompanied by CMA Eyes: Pink conjunctiva, anicteric sclera. HEENT: Normocephalic, moist mucous membranes. Breast: Exam deferred today. Lungs: No audible wheezing or  coughing. Heart: Regular rate and rhythm. Abdomen: Soft, nontender, no obvious distention. Musculoskeletal: No edema, cyanosis, or clubbing.  Left hemipelvectomy Neuro: Alert, answering all questions appropriately. Cranial nerves grossly intact. Skin: No rashes or petechiae noted. Psych: Normal affect.   LAB RESULTS:  Lab Results  Component Value Date   NA 139 08/04/2022   K 4.0 08/04/2022   CL 105 08/04/2022   CO2 27 08/04/2022   GLUCOSE 181 (H) 08/04/2022   BUN 17 08/04/2022   CREATININE 0.72 08/04/2022   CALCIUM 10.8 (H) 08/04/2022   PROT 6.0 (L) 12/17/2021   ALBUMIN 3.8 06/26/2022   AST 19 06/26/2022   ALT 13 06/26/2022   ALKPHOS 58 06/26/2022   BILITOT 0.5 12/17/2021   GFRNONAA >60 08/04/2022   GFRAA >60 01/24/2020    Lab Results  Component Value Date   WBC 8.9 08/04/2022   NEUTROABS 6.3 08/04/2022   HGB 13.4 08/04/2022   HCT 43.0 08/04/2022   MCV 88.5 08/04/2022   PLT 229 08/04/2022     STUDIES: No results found.  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  Homozygous factor V Leiden.  PLAN:    1.  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  Patient had a lumpectomy on July 27, 2017.  She completed radiation with MammoSite treatment.  Given ER/PR positivity  she was felt to benefit from  adjuvant endocrine therapy. Plan is to complete 5 years of treatment in May 2024. Continue letrozole until Sep 03, 2022 then discontinue. Tolerating well. Her most recent mammogram was June 19th, 2023 and was reported as Bi-RADS 1. Repeat in June 2024.   2.  Osteoporosis: Patient's October 18, 2020 revealed a T score of -3.5 which was slightly worse than 1 year prior.  Her most recent bone density test was June  2023 which was T score of -2.2, improved. She will receive Prolia today and again in 6 months. Given hypercalcemia, I've asked her to avoid calcium and vitamin D supplements. Stop multivitamin.   3.  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  4.  Homozygous factor V Leiden: Patient had multiple splenic infarcts suspicious for thrombosis while taking Xarelto.  Subsequent hypercoagulable work-up revealed the patient is homozygous for factor V Leiden.  Continue Pradaxa 150 mg every 12 hours.  She will require lifelong anticoagulation.    5. Hypercalcemia- new problem. Calcium 11.8 in clinic in September. CT C/A/P were negative for recurrent malignancy. Ca remains elevated but improved. Given her goal to avoid invasive procedures or treatments we will defer additional work up.    Disposition:  June 2024- mammogram Prolia today 6 mo- lab (cbc, cmp), Dr Grayland Ormond, +/- prolia- la  I spent a total of 30 minutes reviewing chart data, face-to-face evaluation with the patient, counseling and coordination of care as detailed above.  Patient expressed understanding and was in agreement with this plan. She also understands that She can call clinic at any time with any questions, concerns, or complaints.    Cancer Staging  Primary cancer of upper outer quadrant of left female breast Staging form: Breast, AJCC 8th Edition - Clinical stage from 06/12/2017: Stage IA (cT1b, cN0, cM0, G2, ER+, PR+, HER2-) - Signed by Lloyd Huger, MD on 06/12/2017 Histologic grading system: 3 grade system Laterality: Left   Verlon Au, NP   08/04/2022

## 2022-08-11 LAB — BASIC METABOLIC PANEL
BUN: 17 (ref 4–21)
CO2: 27 — AB (ref 13–22)
Chloride: 108 (ref 99–108)
Creatinine: 0.5 (ref 0.5–1.1)
Glucose: 159
Potassium: 4.7 mEq/L (ref 3.5–5.1)
Sodium: 142 (ref 137–147)

## 2022-08-11 LAB — COMPREHENSIVE METABOLIC PANEL: Calcium: 9.2 (ref 8.7–10.7)

## 2022-08-11 LAB — TSH: TSH: 0.02 — AB (ref 0.41–5.90)

## 2022-08-12 ENCOUNTER — Ambulatory Visit
Admission: RE | Admit: 2022-08-12 | Discharge: 2022-08-12 | Disposition: A | Discharge status: Home / Self Care | Payer: Medicare Other | Source: Ambulatory Visit | Attending: Student | Admitting: Student

## 2022-08-12 DIAGNOSIS — L988 Other specified disorders of the skin and subcutaneous tissue: Secondary | ICD-10-CM

## 2022-08-14 LAB — TSH: TSH: 0.03 — AB (ref 0.41–5.90)

## 2022-08-21 ENCOUNTER — Non-Acute Institutional Stay (SKILLED_NURSING_FACILITY): Payer: Medicare Other | Admitting: Nurse Practitioner

## 2022-08-21 ENCOUNTER — Encounter: Payer: Self-pay | Admitting: Nurse Practitioner

## 2022-08-21 DIAGNOSIS — F3341 Major depressive disorder, recurrent, in partial remission: Secondary | ICD-10-CM

## 2022-08-21 DIAGNOSIS — G548 Other nerve root and plexus disorders: Secondary | ICD-10-CM

## 2022-08-21 DIAGNOSIS — I480 Paroxysmal atrial fibrillation: Secondary | ICD-10-CM | POA: Diagnosis not present

## 2022-08-21 DIAGNOSIS — Z9109 Other allergy status, other than to drugs and biological substances: Secondary | ICD-10-CM

## 2022-08-21 DIAGNOSIS — M810 Age-related osteoporosis without current pathological fracture: Secondary | ICD-10-CM

## 2022-08-21 DIAGNOSIS — R52 Pain, unspecified: Secondary | ICD-10-CM

## 2022-08-21 DIAGNOSIS — M1711 Unilateral primary osteoarthritis, right knee: Secondary | ICD-10-CM

## 2022-08-21 DIAGNOSIS — E1169 Type 2 diabetes mellitus with other specified complication: Secondary | ICD-10-CM

## 2022-08-21 DIAGNOSIS — D6869 Other thrombophilia: Secondary | ICD-10-CM

## 2022-08-21 NOTE — Progress Notes (Signed)
Location:  Other [facility].  Nursing Home Room Number: [facility] [address] Place of Service:  SNF [phone]) Abbey Chatters, NP  PCP: Earnestine Mealing, MD  Patient Care Team: Earnestine Mealing, MD as PCP - General (Family Medicine) Lemar Livings, Merrily Pew, MD (General Surgery) Jeralyn Ruths, MD as Consulting Physician (Oncology)  Extended Emergency Contact Information Primary Emergency Contact: [name] Address: [address]          [place], [place] [address] Darden [place] of [place] Home Phone: [phone] Mobile Phone: [phone] Relation: Daughter  Goals of care: Advanced Directive information    08/21/2022    9:56 AM  Advanced Directives  Does Patient Have a Medical Advance Directive? Yes  Type of Advance Directive Out of facility DNR (pink MOST or yellow form)  Does patient want to make changes to medical advance directive? No - Patient declined     Chief Complaint  Patient presents with   Medical Management of Chronic Issues    Medical Management of Chronic Issues.     HPI:  Pt is a 89 y.o. female seen today for medical management of chronic disease. Pt long term resident at [facility].  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 She went to a wheelchair/scooter. She used to swim a lot for exercise but now unable to do so.  She does not get regular exercise at this time.  Has phantom pains to left leg- on gabapentin which controls symptoms.   Tremor- hands shake constantly and will have trouble eating due to this. Has special utensils which helped.   Allergies- has a little sore throat and cough, some runny nose. This is unchanged since she has been here since 2008  DM- no hypoglycemia- continues on metformin and glipizide.   Mood disorder/panic- on paxil which helps symptoms.   GERD- on omprazole- occasional breakthrough symptoms.   Hx of breast cancer- followed by oncology, taking femara   Osteoporosis- gets prolia at her oncology  office.   Reports her memory is getting worse.   She fell on knee 1.5 years ago, she can not bear weight on right  knee, pain is controlled but unable to stand. Uses lift for tranfers.   Past Medical History:  Diagnosis Date   Anxiety    Benign breast lumps    Blood clotting disorder    Bone cancer    head of femur, left leg ... amputation at age 18   Breast cancer of upper-outer quadrant of left female breast 07/27/2017   11 mm invasive mammary carcinoma, ER 90%, PR 51-90%, negative margins.  Oncotype recurrence score: 1.  DCIS, margin less than 0.5 mm.  Negative sentinel node.  Accelerated partial breast radiation.   Cervicalgia    Chronic kidney disease    kidney stones   Colon cancer    patient unaware of this   Complication of anesthesia    mood alteration / not sure if d/t pain medicine or anesthesia as she passed out    Cough    NASAL DRIP / SNEEZING / SORE THROAT MOSTLY CONSTANT   Depression    Diabetes    Diverticulitis    Dizzy    GERD (gastroesophageal reflux disease)    H/O blood clots    arm and leg   HBP (high blood pressure)    Hematuria    gross   Hemorrhoids    HLD (hyperlipidemia)    Microscopic hematuria 06/02/2015   Muscle pain  Osteoarthritis    Osteoporosis    Panic disorder    Personal history of radiation therapy    PND (post-nasal drip)    CHRONIC WITH SORE THROAT AND SNEEZING   Reflux    Sepsis    Sepsis, unspecified organism 12/15/2021   Formatting of this note might be different from the original.  Last Assessment & Plan: Formatting of this note might be different from the original. Appears to be multifactorial and secondary to healthcare associated pneumonia as well as a UTI CT angio shows asymmetric airspace disease at the left base and posteriorly in the right upper lobe concerning for pneumonia Patient also has pyuria Patient   Severe sepsis 12/15/2021   Skin cancer    nose   Swelling    Tremor    Tremors of nervous system     Ureteral stone with hydronephrosis 11/26/2018   Past Surgical History:  Procedure Laterality Date   APPENDECTOMY  1962   BREAST BIOPSY Right 2006?   benign   BREAST BIOPSY Left 2019   invasive mammary carcinoma   BREAST CYST EXCISION Left 07/27/2017   Procedure: SKIN CYST EXCISED;  Surgeon: Earline Mayotte, MD;  Location: [facility] ORS;  Service: General;  Laterality: Left;   BREAST LUMPECTOMY Left 07/2017   invasive mammary, DCIS  mammosite   BREAST SURGERY Right 2019   CARPAL TUNNEL RELEASE Right    CATARACT EXTRACTION W/PHACO Left 11/11/2016   Procedure: CATARACT EXTRACTION PHACO AND INTRAOCULAR LENS PLACEMENT (IOC);  Surgeon: Galen Manila, MD;  Location: [facility] OR[facility];  Service: Ophthalmology;  Laterality: Left;  [place] 01:07.9 AP% 19.2 CDE 13.02 Fluid pack lot # [id number]   CATARACT EXTRACTION W/PHACO Right 12/09/2016   Procedure: CATARACT EXTRACTION PHACO AND INTRAOCULAR LENS PLACEMENT (IOC);  Surgeon: Galen Manila, MD;  Location: [facility];  Service: Ophthalmology;  Laterality: Right;  [place] 00:49 AP% 21.2 CDE 10.39 Fluid pack lot # [id number]   CHOLECYSTECTOMY     COLONOSCOPY WITH PROPOFOL N/A 11/22/2014   Procedure: COLONOSCOPY WITH PROPOFOL;  Surgeon: Scot Jun, MD;  Location: [facility]COPY;  Service: Endoscopy;  Laterality: N/A;   CYSTOSCOPY WITH STENT PLACEMENT Bilateral 11/27/2018   Procedure: CYSTOSCOPY WITH STENT PLACEMENT;  Surgeon: Jerilee Field, MD;  Location: [facility];  Service: Urology;  Laterality: Bilateral;   CYSTOSCOPY/URETEROSCOPY/HOLMIUM LASER/STENT PLACEMENT Bilateral 12/17/2018   Procedure: CYSTOSCOPY/URETEROSCOPY/HOLMIUM LASER/STENT Exchange;  Surgeon: Sondra Come, MD;  Location: [facility];  Service: Urology;  Laterality: Bilateral;   ESOPHAGOGASTRODUODENOSCOPY  11/22/2014   Procedure: ESOPHAGOGASTRODUODENOSCOPY (EGD);  Surgeon: Scot Jun, MD;  Location: [facility] ENDOSCOPY;  Service: Endoscopy;;   ESOPHAGOGASTRODUODENOSCOPY (EGD) WITH PROPOFOL N/A  03/05/2017   Procedure: ESOPHAGOGASTRODUODENOSCOPY (EGD) WITH PROPOFOL;  Surgeon: Toney Reil, MD;  Location: [facility] ENDOSCOPY;  Service: Gastroenterology;  Laterality: N/A;   EXTRACORPOREAL SHOCK WAVE LITHOTRIPSY Right 03/04/2018   Procedure: EXTRACORPOREAL SHOCK WAVE LITHOTRIPSY (ESWL);  Surgeon: Sondra Come, MD;  Location: [facility] OR[facility];  Service: Urology;  Laterality: Right;   EYE SURGERY Bilateral 2012   cataract extraction with iol   GALLBLADDER SURGERY  1968   hemi pelvectomy     left side at age 62   HEMIPELVIC Left 1962   LEG SURGERY Left    AMPUTATION d/t cancer at 89 years old   MASTECTOMY, PARTIAL Left 07/27/2017   Procedure: MASTECTOMY PARTIAL;  Surgeon: Earline Mayotte, MD;  Location: [facility];  Service: General;  Laterality: Left;   MOUTH SURGERY  2019   teeth pulled with a bridge  insertion.   fell out 12/16/18   OVARY SURGERY Right    cyst removed   SAVORY DILATION  11/22/2014   Procedure: SAVORY DILATION;  Surgeon: Scot Jun, MD;  Location: [facility] ENDOSCOPY;  Service: Endoscopy;;   SENTINEL NODE BIOPSY Left 07/27/2017   Procedure: SENTINEL NODE BIOPSY;  Surgeon: Earline Mayotte, MD;  Location: [facility] OR[facility];  Service: General;  Laterality: Left;   SKIN CANCER EXCISION     nose and arm and face   TEE WITHOUT CARDIOVERSION N/A 07/25/2019   Procedure: TRANSESOPHAGEAL ECHOCARDIOGRAM (TEE);  Surgeon: Lamar Blinks, MD;  Location: [facility];  Service: Cardiovascular;  Laterality: N/A;   TONSILLECTOMY      Allergies  Allergen Reactions   Ciprofloxacin Hives   Codeine Other (See Comments)    HALLUCINATIONS   Tape     Rash and skin irritation/ paper tape and tegaderm OK   Ciprocinonide [Fluocinolone] Other (See Comments)    unknown   Amoxicillin Other (See Comments)    Upset stomach Has patient had a PCN reaction causing immediate rash, facial/tongue/throat swelling, SOB or lightheadedness with hypotension: No Has patient had a PCN reaction causing severe  rash involving mucus membranes or skin necrosis: No Has patient had a PCN reaction that required hospitalization: No Has patient had a PCN reaction occurring within the last 10 years: Yes If all of the above answers are "NO", then may proceed with Cephalosporin use.;   Cefuroxime Other (See Comments)    OK INTRACAMERALLY PER DR WLP, upset stomach   Latex Rash    Rast test NEGATIVE   Lipitor [Atorvastatin] Other (See Comments)    unknown    Outpatient Encounter Medications as of 08/21/2022  Medication Sig   acetaminophen (TYLENOL) 500 MG tablet Take 500 mg by mouth every 6 (six) hours as needed.   acetaminophen (TYLENOL) 650 MG CR tablet Take 650 mg by mouth every 8 (eight) hours as needed for pain.   dextromethorphan-guaiFENesin (ROBITUSSIN-DM) 10-100 MG/5ML liquid Take 10 mLs by mouth every 4 (four) hours as needed for cough.   diclofenac Sodium (VOLTAREN) 1 % GEL Apply 4 g topically. Apply to stump topically every 6 hours as needed. Apply to right knee topically three times daily for pain.   gabapentin (NEURONTIN) 100 MG capsule Take 100 mg by mouth 3 (three) times daily.   GLIPIZIDE XL 2.5 MG 24 hr tablet Take 2.5 mg by mouth daily.    hydrocortisone 2.5 % cream Apply to Hemorrhoids/buttucks topically every 8 hours as needed for hemorrhoids.   hydrocortisone cream 1 % Apply to rectum topically every 6 hours as needed   Infant Care Products (DERMACLOUD) OINT Apply to Sacrum topically every shift for prevention   letrozole (FEMARA) 2.5 MG tablet TAKE ONE TABLET BY MOUTH EVERY DAY   loratadine (CLARITIN) 10 MG tablet Take 10 mg by mouth daily.   melatonin 3 MG TABS tablet Take 3 mg by mouth at bedtime.   metFORMIN (GLUCOPHAGE) 500 MG tablet Take 750 mg by mouth 2 (two) times daily with a meal.   metoprolol tartrate (LOPRESSOR) 25 MG tablet Take 1 tablet (25 mg total) by mouth 3 (three) times daily.   Multiple Vitamin (MULTIVITAMIN WITH MINERALS) TABS tablet Take 1 tablet by mouth daily.    omeprazole (PRILOSEC) 40 MG capsule Take 40 mg by mouth daily.    ondansetron (ZOFRAN) 4 MG tablet Take 4 mg by mouth every 4 (four) hours as needed for nausea or vomiting.   OXYGEN  2lpm as needed for sats less than 88% every 1 hour as needed   PARoxetine (PAXIL) 30 MG tablet Take 30 mg by mouth daily.   Polyethyl Glycol-Propyl Glycol 0.4-0.3 % SOLN Apply to eye 2 (two) times daily as needed.   polyethylene glycol (MIRALAX / GLYCOLAX) 17 g packet Take 17 g by mouth daily as needed.   PRADAXA 150 MG CAPS capsule TAKE 1 CAPSULE BY MOUTH TWICE DAILY START TREATMENT 10 HRS LAST DOSE OF LOVENOX   tiZANidine (ZANAFLEX) 2 MG tablet Take 2 mg by mouth every 12 (twelve) hours as needed for muscle spasms.   Zinc Oxide (TRIPLE PASTE) 12.8 % ointment Apply to sacrum/buttocks topically every shift.   [DISCONTINUED] Biotin 19147 MCG TABS Take 10,000 mcg by mouth at bedtime.   [DISCONTINUED] traZODone (DESYREL) 50 MG tablet Take 12.5 mg by mouth every 8 (eight) hours as needed (Anxiety/Agitation).   No facility-administered encounter medications on file as of 08/21/2022.    Review of Systems  Constitutional:  Negative for activity change, appetite change, fatigue and unexpected weight change.  HENT:  Negative for congestion and hearing loss.   Eyes: Negative.   Respiratory:  Negative for cough and shortness of breath.   Cardiovascular:  Negative for chest pain, palpitations and leg swelling.  Gastrointestinal:  Negative for abdominal pain, constipation and diarrhea.  Genitourinary:  Negative for difficulty urinating and dysuria.  Musculoskeletal:  Positive for arthralgias and gait problem. Negative for myalgias.  Skin:  Negative for color change and wound.  Neurological:  Positive for weakness. Negative for dizziness.  Psychiatric/Behavioral:  Negative for agitation, behavioral problems and confusion.      Immunization History  Administered Date(s) Administered   Influenza-Unspecified 02/03/2020,  02/18/2021, 02/18/2022   Pneumococcal Conjugate-13 09/28/2017   Unspecified SARS-COV-2 Vaccination 05/20/2019, 06/17/2019, 03/16/2020, 09/20/2020, 01/25/2021   Pertinent  Health Maintenance Due  Topic Date Due   OPHTHALMOLOGY EXAM  07/31/2023 (Originally 04/29/1944)   FOOT EXAM  09/11/2022   HEMOGLOBIN A1C  11/27/2022   INFLUENZA VACCINE  12/04/2022   DEXA SCAN  Completed      12/18/2021    9:00 AM 12/18/2021    7:45 PM 12/19/2021    7:00 AM 12/19/2021   12:00 PM 01/24/2022   11:20 AM  Fall Risk  (RETIRED) Patient Fall Risk Level High fall risk High fall risk High fall risk High fall risk High fall risk   Functional Status Survey:    Vitals:   08/21/22 0916  BP: 134/70  Pulse: 76  Resp: 20  Temp: 97.8 F (36.6 C)  SpO2: 90%  Weight: 157 lb 6.4 oz (71.4 kg)  Height:  (1.6 m)   Body mass index is 27.88 kg/m. Physical Exam Constitutional:      General: She is not in acute distress.    Appearance: She is well-developed. She is not diaphoretic.  HENT:     Head: Normocephalic and atraumatic.     Mouth/Throat:     Pharynx: No oropharyngeal exudate.  Eyes:     Conjunctiva/sclera: Conjunctivae normal.     Pupils: Pupils are equal, round, and reactive to light.  Cardiovascular:     Rate and Rhythm: Normal rate and regular rhythm.     Heart sounds: Normal heart sounds.  Pulmonary:     Effort: Pulmonary effort is normal.     Breath sounds: Normal breath sounds.  Abdominal:     General: Bowel sounds are normal.     Palpations: Abdomen is soft.  Musculoskeletal:  Cervical back: Normal range of motion and neck supple.     Right lower leg: No edema.     Left lower leg: No edema.  Skin:    General: Skin is warm and dry.  Neurological:     Mental Status: She is alert and oriented to person, place, and time.     Motor: Weakness present.     Gait: Gait abnormal.  Psychiatric:        Mood and Affect: Mood normal.     Labs reviewed: Recent Labs     12/17/21 0533 12/18/21 1207 12/19/21 0621 01/24/22 1051 01/24/22 1157 01/31/22 1053 03/05/22 0000 06/26/22 0000 08/04/22 1033 08/11/22 0000  NA 136  --    < > 143  --  137   < > 137 139 142  K 3.5  --    < > 4.7  --  4.8   < > 4.9 4.0 4.7  CL 109  --    < > 107  --  110   < > 102 105 108  CO2 20*  --    < > 31  --  23   < > 28* 27 27*  GLUCOSE 199*  --    < > 198*  --  236*  --   --  181*  --   BUN 28*  --    < > 22  --  22   < > 23* 17 17  CREATININE 0.61  --    < > 0.69  --  0.74   < > 0.5 0.72 0.5  CALCIUM 9.5  --    < > 11.8*   < > 9.7   < > 11.1* 10.8* 9.2  MG 1.5* 1.8  --   --   --   --   --   --   --   --    < > = values in this interval not displayed.   Recent Labs    12/15/21 1011 12/17/21 0533 03/05/22 0000 06/26/22 0000  AST 33 25 18 19   ALT 17 26 10 13   ALKPHOS 43 45 65 58  BILITOT 0.5 0.5  --   --   PROT 6.8 6.0*  --   --   ALBUMIN 3.3* 2.7* 4.0 3.8   Recent Labs    12/17/21 0533 01/24/22 1051 01/24/22 1051 03/05/22 0000 06/26/22 0000 07/10/22 0000 08/04/22 1033  WBC 8.6 9.3   < > 8.6 8.5 9.2 8.9  NEUTROABS  --  5.9  --  5,616.00 5,347.00  --  6.3  HGB 10.9* 12.2  --  13.1 13.8 14.1 13.4  HCT 36.4 41.0  --  42 43 44 43.0  MCV 81.3 86.0  --   --   --   --  88.5  PLT 125* 218  --  205 173 165 229   < > = values in this interval not displayed.   Lab Results  Component Value Date   TSH 0.03 (A) 08/14/2022   Lab Results  Component Value Date   HGBA1C 7.4 05/29/2022   Lab Results  Component Value Date   CHOL 110 12/15/2021   HDL 19 (L) 12/15/2021   LDLCALC 46 12/15/2021   TRIG 223 (H) 12/15/2021   CHOLHDL 5.8 12/15/2021    Significant Diagnostic Results in last 30 days:  No results found.  Assessment/Plan 1. Paroxysmal atrial fibrillation - rate controlled, continues on metoprolol for rate controlled   2. Type 2 diabetes  mellitus with other specified complication, without long-term current use of insulin -no hypoglycemic episodes.  Goal A1c <7.5 -continues on metformin and glipizide   3. Osteoporosis, post-menopausal -continues on prolia injection given by oncology  4. Recurrent major depressive disorder, in partial remission -stable, continues on Paxil  5. Phantom pain -stable on gabapentin   6. Environmental allergies Stable, will change Claritin to PRN  7. Osteoarthritis of right knee, unspecified osteoarthritis type -stable, continues on tylenol PRN, also has Voltaren gel PRN   Shaka Cardin K. Biagio [facility] [phone]

## 2022-09-03 LAB — HEMOGLOBIN A1C: Hemoglobin A1C: 7.1

## 2022-09-11 IMAGING — MG MM DIGITAL SCREENING BILAT W/ TOMO AND CAD
6 of 10 series · 6 of 30 positions shown · non-contrast
Comparison: Previous exam(s).

CLINICAL DATA: Screening.

EXAM:
DIGITAL SCREENING BILATERAL MAMMOGRAM WITH TOMOSYNTHESIS AND CAD
TECHNIQUE: Bilateral screening digital craniocaudal and mediolateral oblique
mammograms were obtained. Bilateral screening digital breast
tomosynthesis was performed. The images were evaluated with
computer-aided detection.

[L CC synth-2D]
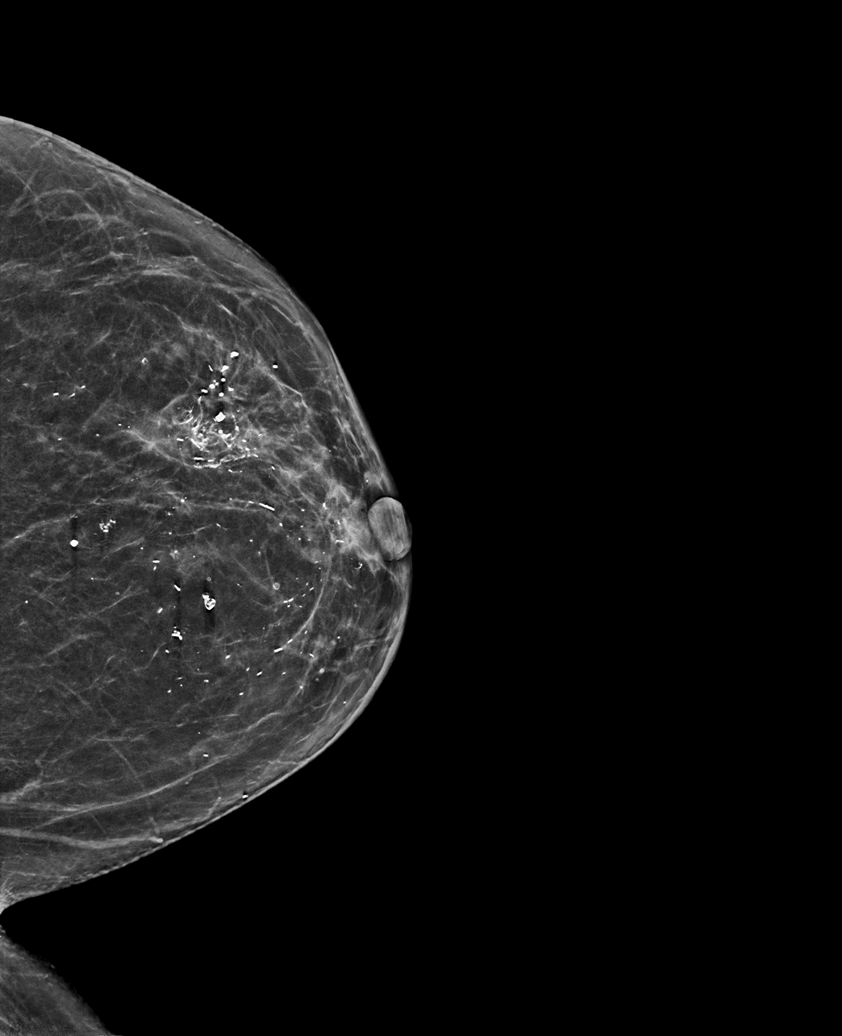

[L MLO synth-2D]
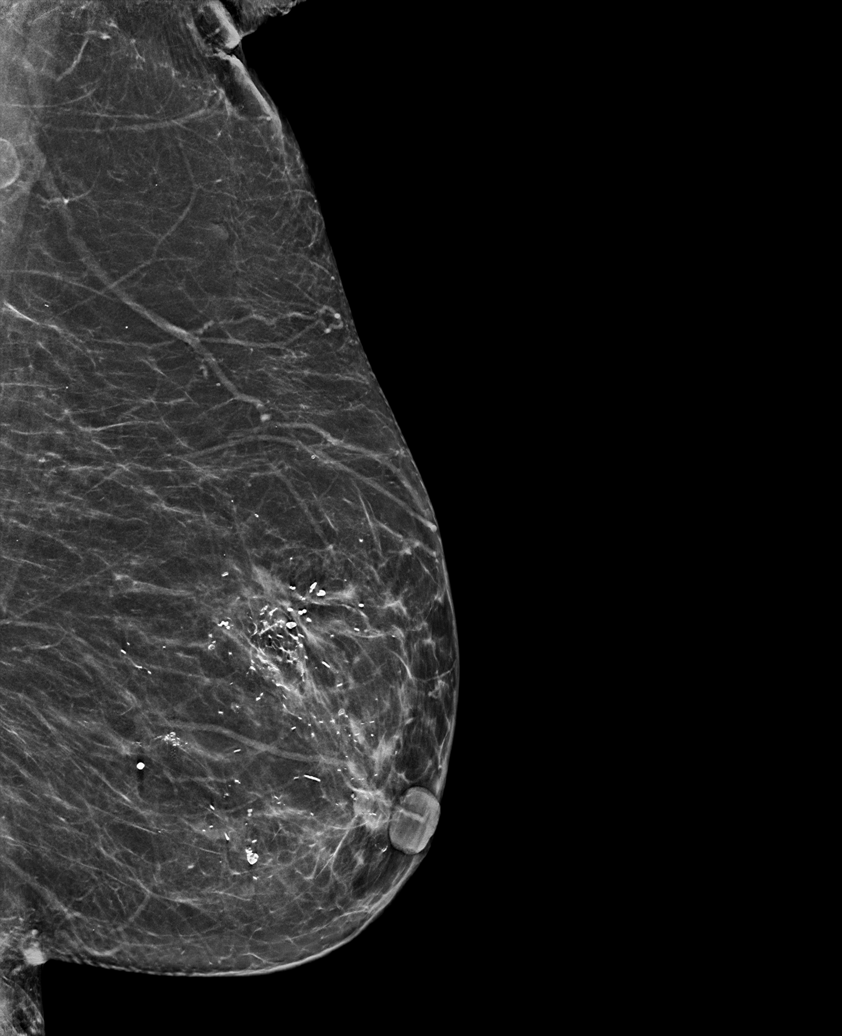

[R MLO synth-2D (1 of 2)]
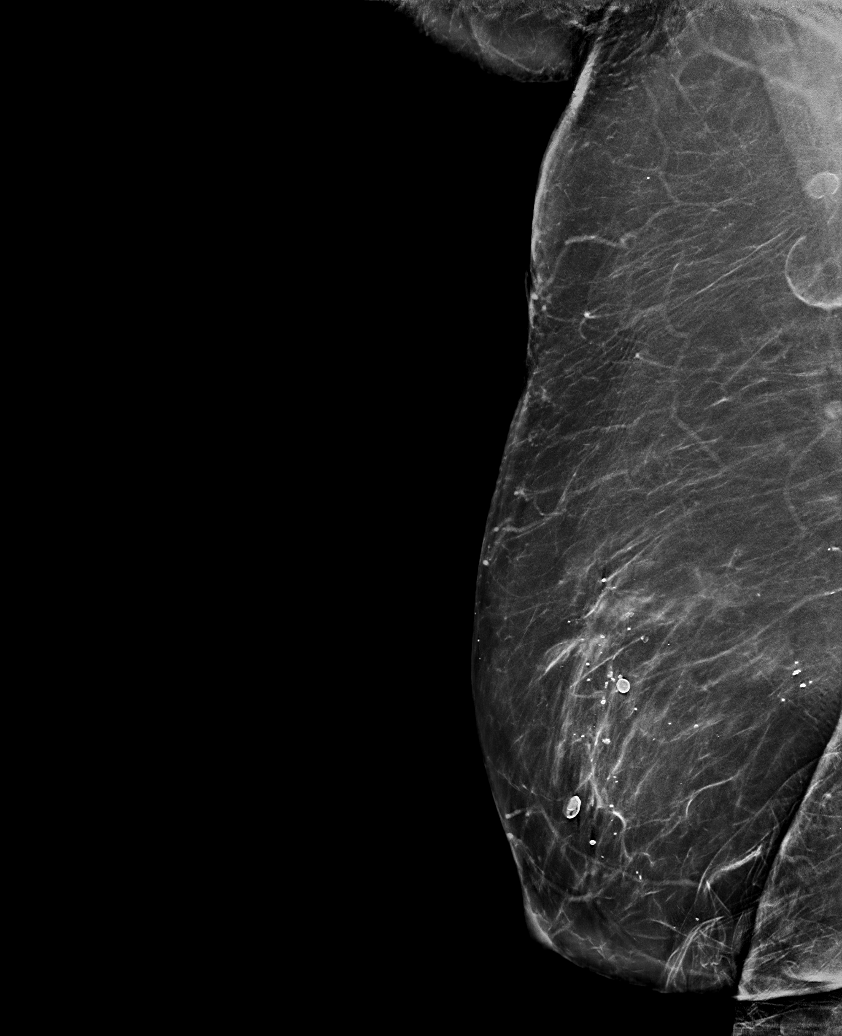

[R MLO synth-2D (2 of 2)]
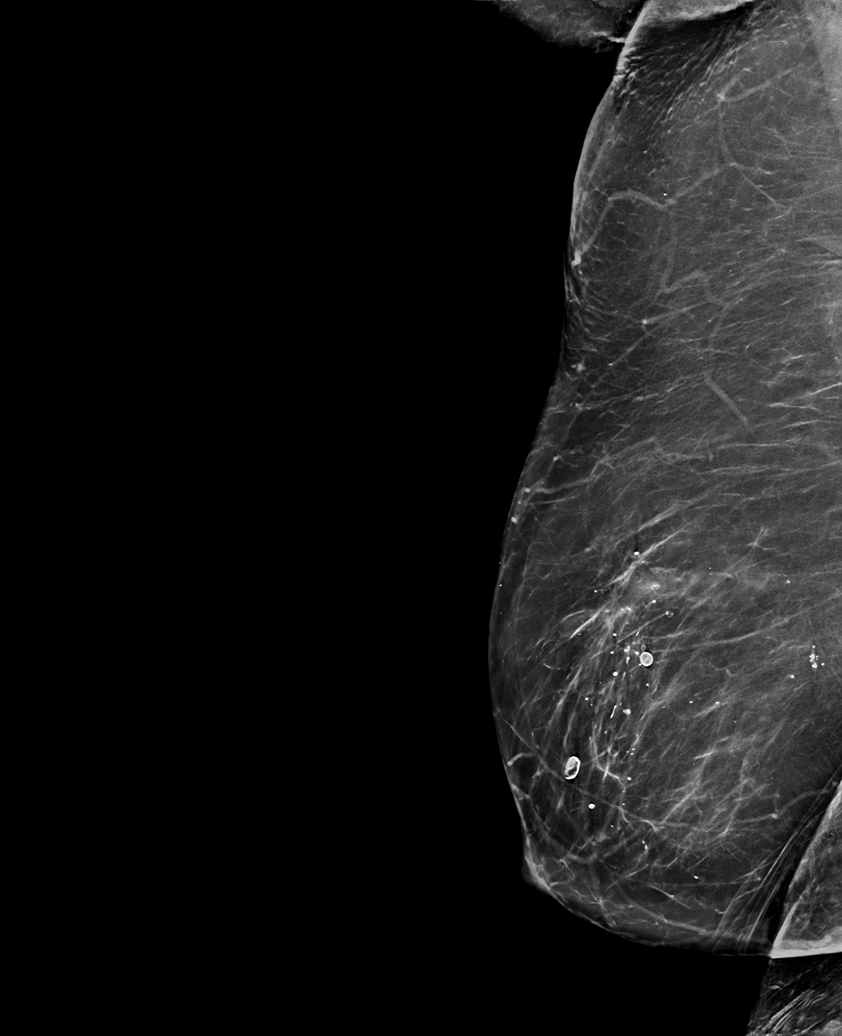

[R CC synth-2D]
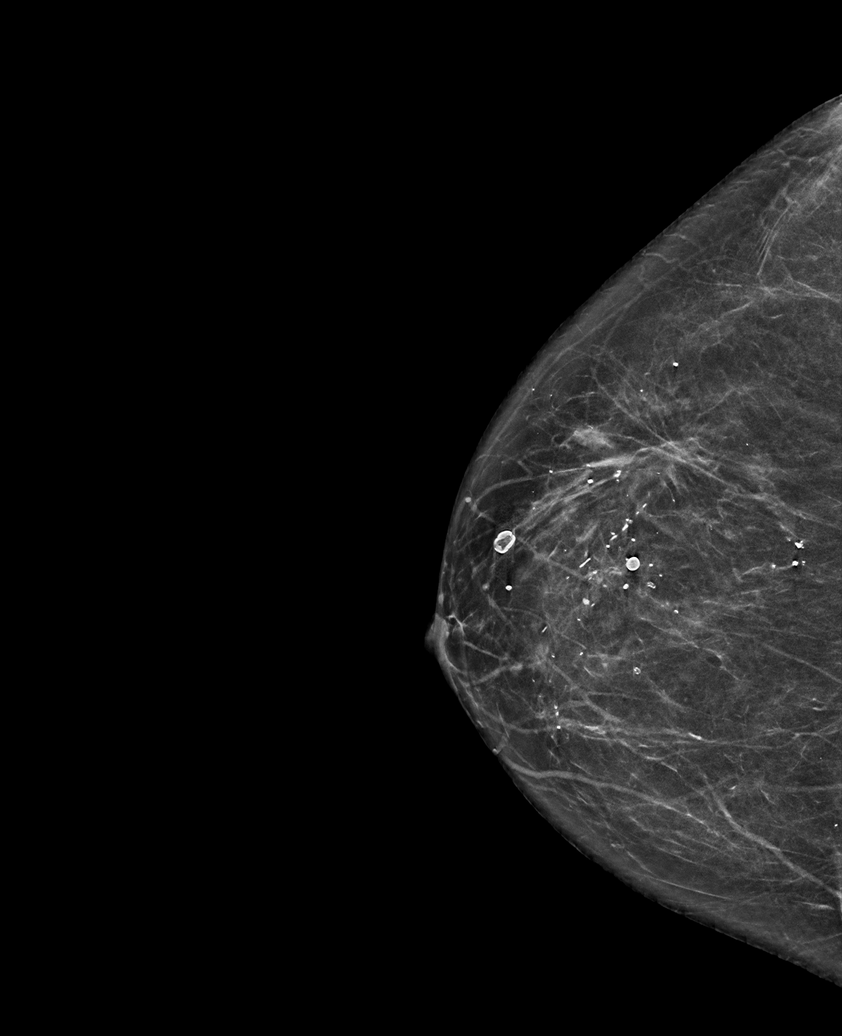

[R CC tomo · tomo slice 31/62.0]
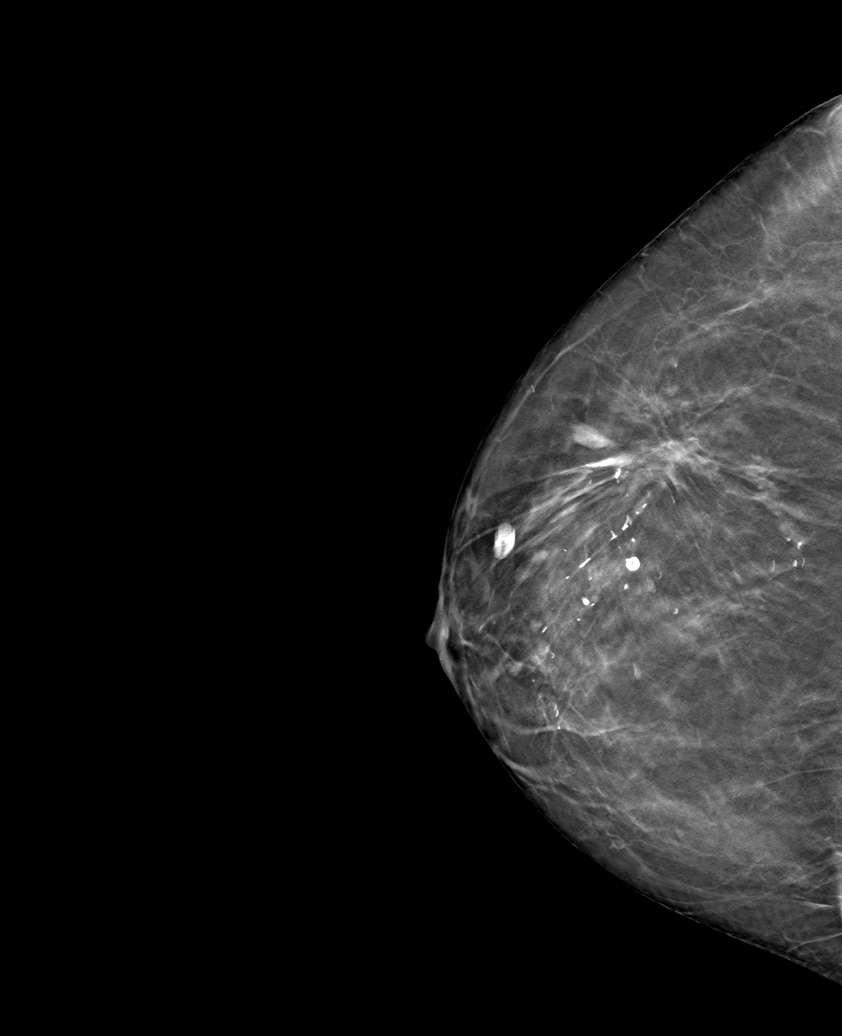

[6 of 30 positions shown; findings below may reference images not displayed]

ACR Breast Density Category b: There are scattered areas of
fibroglandular density.
FINDINGS: There are no findings suspicious for malignancy.
IMPRESSION: No mammographic evidence of malignancy. A result letter of this
screening mammogram will be mailed directly to the patient.

RECOMMENDATION:
Screening mammogram in one year. (Code:51-O-LD2)

BI-RADS CATEGORY  1: Negative.

## 2022-10-15 ENCOUNTER — Encounter: Payer: Self-pay | Admitting: Student

## 2022-10-15 ENCOUNTER — Non-Acute Institutional Stay (SKILLED_NURSING_FACILITY): Payer: Medicare Other | Admitting: Student

## 2022-10-15 DIAGNOSIS — E1169 Type 2 diabetes mellitus with other specified complication: Secondary | ICD-10-CM | POA: Diagnosis not present

## 2022-10-15 DIAGNOSIS — I480 Paroxysmal atrial fibrillation: Secondary | ICD-10-CM

## 2022-10-15 DIAGNOSIS — I739 Peripheral vascular disease, unspecified: Secondary | ICD-10-CM

## 2022-10-15 DIAGNOSIS — E213 Hyperparathyroidism, unspecified: Secondary | ICD-10-CM

## 2022-10-15 DIAGNOSIS — S88912S Complete traumatic amputation of left lower leg, level unspecified, sequela: Secondary | ICD-10-CM

## 2022-10-15 DIAGNOSIS — I10 Essential (primary) hypertension: Secondary | ICD-10-CM

## 2022-10-15 DIAGNOSIS — F3341 Major depressive disorder, recurrent, in partial remission: Secondary | ICD-10-CM

## 2022-10-15 NOTE — Progress Notes (Signed)
Location:  Other Nursing Home Room Number: [facility] [address] Place of Service:  SNF [phone]) Provider:  Ander Gaster, Benetta Spar, MD  Patient Care Team: Earnestine Mealing, MD as PCP - General (Family Medicine) Lemar Livings Merrily Pew, MD (General Surgery) Jeralyn Ruths, MD as Consulting Physician (Oncology)  Extended Emergency Contact Information Primary Emergency Contact: [name] Address: [address]          [place], [place] [address] [place] of [place] Home Phone: [phone] Mobile Phone: [phone] Relation: Daughter  Code Status:  DNR Goals of care: Advanced Directive information    08/21/2022    9:56 AM  Advanced Directives  Does Patient Have a Medical Advance Directive? Yes  Type of Advance Directive Out of facility DNR (pink MOST or yellow form)  Does patient want to make changes to medical advance directive? No - Patient declined     Chief Complaint  Patient presents with   Medical Management of Chronic Issues    Medical Management of Chronic Issues.     HPI:  Pt is a 89 y.o. female seen today for medical management of chronic diseases.    She continues to have knee pain. She feels balanced in the wheelchair now. They have gotten   She has trigger fingers and she does exercises for her fingers and that's helped  Nursing states she has a recurrent rash under her left breast  She has lost 4 crowns from her mouth. They aren't hurting dentist said if they start hurting they will pull them. She does not want to have false teeth. She has cavities in her teeth. They are going to leave them alone unless she has pain.  She is going to eye doctor this month. She sees double at times and just needs an adjustment in her Rx.   Reviewing last labs - PTH elevated, Calcium normal. She WOULD NOT want surgery.   Past Medical History:  Diagnosis Date   Anxiety    Benign breast lumps    Blood clotting disorder (HCC)    Bone cancer (HCC)    head of femur, left  leg ... 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  Oncotype recurrence score: 1.  DCIS, margin less than 0.5 mm.  Negative sentinel node.  Accelerated partial breast radiation.   Cervicalgia    Chronic kidney disease    kidney stones   Colon cancer [facility])    patient unaware of this   Complication of anesthesia    mood alteration / not sure if d/t pain medicine or anesthesia as she passed out    Cough    NASAL DRIP / SNEEZING / SORE THROAT MOSTLY CONSTANT   Depression    Diabetes (HCC)    Diverticulitis    Dizzy    GERD (gastroesophageal reflux disease)    H/O blood clots    arm and leg   HBP (high blood pressure)    Hematuria    gross   Hemorrhoids    HLD (hyperlipidemia)    Microscopic hematuria 06/02/2015   Muscle pain    Osteoarthritis    Osteoporosis    Panic disorder    Personal history of radiation therapy    PND (post-nasal drip)    CHRONIC WITH SORE THROAT AND SNEEZING   Reflux    Sepsis (HCC)    Sepsis, unspecified organism (HCC) 12/15/2021  Formatting of this note might be different from the original.  Last Assessment & Plan: Formatting of this note might be different from the original. Appears to be multifactorial and secondary to healthcare associated pneumonia as well as a UTI CT angio shows asymmetric airspace disease at the left base and posteriorly in the right upper lobe concerning for pneumonia Patient also has pyuria Patient   Severe sepsis (HCC) 12/15/2021   Skin cancer    nose   Swelling    Tremor    Tremors of nervous system    Ureteral stone with hydronephrosis 11/26/2018   Past Surgical History:  Procedure Laterality Date   APPENDECTOMY  1962   BREAST BIOPSY Right 2006?   benign   BREAST BIOPSY Left 2019   invasive mammary carcinoma   BREAST CYST EXCISION Left 07/27/2017   Procedure: SKIN CYST EXCISED;  Surgeon:  Earline Mayotte, MD;  Location: [facility] ORS;  Service: General;  Laterality: Left;   BREAST LUMPECTOMY Left 07/2017   invasive mammary, DCIS  mammosite   BREAST SURGERY Right 2019   CARPAL TUNNEL RELEASE Right    CATARACT EXTRACTION W/PHACO Left 11/11/2016   Procedure: CATARACT EXTRACTION PHACO AND INTRAOCULAR LENS PLACEMENT (IOC);  Surgeon: Galen Manila, MD;  Location: [facility] ORS;  Service: Ophthalmology;  Laterality: Left;  [place] 01:07.9 AP% 19.2 CDE 13.02 Fluid pack lot # [id number]   CATARACT EXTRACTION W/PHACO Right 12/09/2016   Procedure: CATARACT EXTRACTION PHACO AND INTRAOCULAR LENS PLACEMENT (IOC);  Surgeon: Galen Manila, MD;  Location: [facility] ORS;  Service: Ophthalmology;  Laterality: Right;  [place] 00:49 AP% 21.2 CDE 10.39 Fluid pack lot # [id number]   CHOLECYSTECTOMY     COLONOSCOPY WITH PROPOFOL N/A 11/22/2014   Procedure: COLONOSCOPY WITH PROPOFOL;  Surgeon: Scot Jun, MD;  Location: [facility]COPY;  Service: Endoscopy;  Laterality: N/A;   CYSTOSCOPY WITH STENT PLACEMENT Bilateral 11/27/2018   Procedure: CYSTOSCOPY WITH STENT PLACEMENT;  Surgeon: Jerilee Field, MD;  Location: [facility] ORS;  Service: Urology;  Laterality: Bilateral;   CYSTOSCOPY/URETEROSCOPY/HOLMIUM LASER/STENT PLACEMENT Bilateral 12/17/2018   Procedure: CYSTOSCOPY/URETEROSCOPY/HOLMIUM LASER/STENT Exchange;  Surgeon: Sondra Come, MD;  Location: [facility] ORS;  Service: Urology;  Laterality: Bilateral;   ESOPHAGOGASTRODUODENOSCOPY  11/22/2014   Procedure: ESOPHAGOGASTRODUODENOSCOPY (EGD);  Surgeon: Scot Jun, MD;  Location: [facility] ENDOSCOPY;  Service: Endoscopy;;   ESOPHAGOGASTRODUODENOSCOPY (EGD) WITH PROPOFOL N/A 03/05/2017   Procedure: ESOPHAGOGASTRODUODENOSCOPY (EGD) WITH PROPOFOL;  Surgeon: Toney Reil, MD;  Location: [facility] ENDOSCOPY;  Service: Gastroenterology;  Laterality: N/A;   EXTRACORPOREAL SHOCK WAVE LITHOTRIPSY Right 03/04/2018   Procedure: EXTRACORPOREAL SHOCK WAVE LITHOTRIPSY (ESWL);   Surgeon: Sondra Come, MD;  Location: [facility] ORS;  Service: Urology;  Laterality: Right;   EYE SURGERY Bilateral 2012   cataract extraction with iol   GALLBLADDER SURGERY  1968   hemi pelvectomy     left side at age 88   HEMIPELVIC Left 1962   LEG SURGERY Left    AMPUTATION d/t cancer at 89 years old   MASTECTOMY, PARTIAL Left 07/27/2017   Procedure: MASTECTOMY PARTIAL;  Surgeon: Earline Mayotte, MD;  Location: [facility] ORS;  Service: General;  Laterality: Left;   MOUTH SURGERY  2019   teeth pulled with a bridge insertion.   fell out 12/16/18   OVARY SURGERY Right    cyst removed   SAVORY DILATION  11/22/2014   Procedure: SAVORY DILATION;  Surgeon: Scot Jun, MD;  Location[facility] ENDOSCOPY;  Service: Endoscopy;;   SENTINEL NODE BIOPSY Left 07/27/2017  Procedure: SENTINEL NODE BIOPSY;  Surgeon: Earline Mayotte, MD;  Location: [facility] ORS;  Service: General;  Laterality: Left;   SKIN CANCER EXCISION     nose and arm and face   TEE WITHOUT CARDIOVERSION N/A 07/25/2019   Procedure: TRANSESOPHAGEAL ECHOCARDIOGRAM (TEE);  Surgeon: Lamar Blinks, MD;  Location: [facility] ORS;  Service: Cardiovascular;  Laterality: N/A;   TONSILLECTOMY      Allergies  Allergen Reactions   Ciprofloxacin Hives   Codeine Other (See Comments)    HALLUCINATIONS   Tape     Rash and skin irritation/ paper tape and tegaderm OK   Ciprocinonide [Fluocinolone] Other (See Comments)    unknown   Amoxicillin Other (See Comments)    Upset stomach Has patient had a PCN reaction causing immediate rash, facial/tongue/throat swelling, SOB or lightheadedness with hypotension: No Has patient had a PCN reaction causing severe rash involving mucus membranes or skin necrosis: No Has patient had a PCN reaction that required hospitalization: No Has patient had a PCN reaction occurring within the last 10 years: Yes If all of the above answers are "NO", then may proceed with Cephalosporin use.;   Cefuroxime Other (See  Comments)    OK INTRACAMERALLY PER DR WLP, upset stomach   Latex Rash    Rast test NEGATIVE   Lipitor [Atorvastatin] Other (See Comments)    unknown    Outpatient Encounter Medications as of 10/15/2022  Medication Sig   acetaminophen (TYLENOL) 650 MG CR tablet Take 650 mg by mouth every 8 (eight) hours as needed for pain. Give one tablet by mouth three times daily.   dextromethorphan-guaiFENesin (ROBITUSSIN-DM) 10-100 MG/5ML liquid Take 10 mLs by mouth every 4 (four) hours as needed for cough.   diclofenac Sodium (VOLTAREN) 1 % GEL Apply 4 g topically. Apply to stump topically every 6 hours as needed. Apply to right knee topically three times daily for pain.   gabapentin (NEURONTIN) 100 MG capsule Take 100 mg by mouth 3 (three) times daily.   GLIPIZIDE XL 2.5 MG 24 hr tablet Take 2.5 mg by mouth daily.    hydrocortisone 2.5 % cream Apply to Hemorrhoids/buttucks topically every 8 hours as needed for hemorrhoids.   hydrocortisone cream 1 % Apply to rectum topically every 6 hours as needed   Infant Care Products (DERMACLOUD) OINT Apply to Sacrum topically every shift for prevention   loratadine (CLARITIN) 10 MG tablet Take 10 mg by mouth daily.   melatonin 3 MG TABS tablet Take 3 mg by mouth at bedtime.   metFORMIN (GLUCOPHAGE) 500 MG tablet Take 750 mg by mouth 2 (two) times daily with a meal.   metoprolol tartrate (LOPRESSOR) 25 MG tablet Take 1 tablet (25 mg total) by mouth 3 (three) times daily.   Multiple Vitamin (MULTIVITAMIN WITH MINERALS) TABS tablet Take 1 tablet by mouth daily.   omeprazole (PRILOSEC) 40 MG capsule Take 40 mg by mouth daily.    ondansetron (ZOFRAN) 4 MG tablet Take 4 mg by mouth every 4 (four) hours as needed for nausea or vomiting.   OXYGEN 2lpm as needed for sats less than 88% every 1 hour as needed   PARoxetine (PAXIL) 30 MG tablet Take 30 mg by mouth daily.   Polyethyl Glycol-Propyl Glycol 0.4-0.3 % SOLN Apply to eye 2 (two) times daily as needed.   polyethylene  glycol (MIRALAX / GLYCOLAX) 17 g packet Take 17 g by mouth daily as needed.   PRADAXA 150 MG CAPS capsule TAKE 1 CAPSULE BY MOUTH  TWICE DAILY START TREATMENT 10 HRS LAST DOSE OF LOVENOX   tiZANidine (ZANAFLEX) 2 MG tablet Take 2 mg by mouth every 12 (twelve) hours as needed for muscle spasms.   Zinc Oxide (TRIPLE PASTE) 12.8 % ointment Apply to sacrum/buttocks topically every shift.   [DISCONTINUED] letrozole (FEMARA) 2.5 MG tablet TAKE ONE TABLET BY MOUTH EVERY DAY   No facility-administered encounter medications on file as of 10/15/2022.    Review of Systems  Immunization History  Administered Date(s) Administered   Covid-19, Mrna,Vaccine(Spikevax)52yrs and older 08/12/2022   Influenza-Unspecified 02/03/2020, 02/18/2021, 02/18/2022   Pneumococcal Conjugate-13 09/28/2017   Unspecified SARS-COV-2 Vaccination 05/20/2019, 06/17/2019, 03/16/2020, 09/20/2020, 01/25/2021   Pertinent  Health Maintenance Due  Topic Date Due   FOOT EXAM  09/11/2022   OPHTHALMOLOGY EXAM  07/31/2023 (Originally 04/29/1944)   HEMOGLOBIN A1C  11/27/2022   INFLUENZA VACCINE  12/04/2022   DEXA SCAN  Completed      12/18/2021    9:00 AM 12/18/2021    7:45 PM 12/19/2021    7:00 AM 12/19/2021   12:00 PM 01/24/2022   11:20 AM  Fall Risk  (RETIRED) Patient Fall Risk Level High fall risk High fall risk High fall risk High fall risk High fall risk   Functional Status Survey:    Vitals:   10/15/22 0915  BP: (!) 143/66  Pulse: 72  Resp: 18  Temp: 98.1 F (36.7 C)  SpO2: (!) 89%  Weight: 165 lb 9.6 oz (75.1 kg)  Height: 5\' 3"  (1.6 m)   Body mass index is 29.33 kg/m. Physical Exam Vitals reviewed.  Constitutional:      Appearance: Normal appearance.  Cardiovascular:     Rate and Rhythm: Normal rate and regular rhythm.     Pulses: Normal pulses.     Heart sounds: Normal heart sounds.  Pulmonary:     Effort: Pulmonary effort is normal.  Neurological:     Mental Status: She is alert and oriented to  person, place, and time.     Labs reviewed: Recent Labs    12/17/21 0533 12/18/21 1207 12/19/21 0621 01/24/22 1051 01/24/22 1157 01/31/22 1053 03/05/22 0000 06/26/22 0000 08/04/22 1033 08/11/22 0000  NA 136  --    < > 143  --  137   < > 137 139 142  K 3.5  --    < > 4.7  --  4.8   < > 4.9 4.0 4.7  CL 109  --    < > 107  --  110   < > 102 105 108  CO2 20*  --    < > 31  --  23   < > 28* 27 27*  GLUCOSE 199*  --    < > 198*  --  236*  --   --  181*  --   BUN 28*  --    < > 22  --  22   < > 23* 17 17  CREATININE 0.61  --    < > 0.69  --  0.74   < > 0.5 0.72 0.5  CALCIUM 9.5  --    < > 11.8*   < > 9.7   < > 11.1* 10.8* 9.2  MG 1.5* 1.8  --   --   --   --   --   --   --   --    < > = values in this interval not displayed.   Recent Labs    12/15/21  1011 12/17/21 0533 03/05/22 0000 06/26/22 0000  AST 33 25 18 19   ALT 17 26 10 13   ALKPHOS 43 45 65 58  BILITOT 0.5 0.5  --   --   PROT 6.8 6.0*  --   --   ALBUMIN 3.3* 2.7* 4.0 3.8   Recent Labs    12/17/21 0533 01/24/22 1051 01/24/22 1051 03/05/22 0000 06/26/22 0000 07/10/22 0000 08/04/22 1033  WBC 8.6 9.3   < > 8.6 8.5 9.2 8.9  NEUTROABS  --  5.9  --  5,616.00 5,347.00  --  6.3  HGB 10.9* 12.2  --  13.1 13.8 14.1 13.4  HCT 36.4 41.0  --  42 43 44 43.0  MCV 81.3 86.0  --   --   --   --  88.5  PLT 125* 218  --  205 173 165 229   < > = values in this interval not displayed.   Lab Results  Component Value Date   TSH 0.03 (A) 08/14/2022   Lab Results  Component Value Date   HGBA1C 7.4 05/29/2022   Lab Results  Component Value Date   CHOL 110 12/15/2021   HDL 19 (L) 12/15/2021   LDLCALC 46 12/15/2021   TRIG 223 (H) 12/15/2021   CHOLHDL 5.8 12/15/2021    Significant Diagnostic Results in last 30 days:  No results found.  Assessment/Plan 1. Hyperparathyroidism (HCC) Noted on most recent labs. Will plan for repeat PTH, Calcium, Ionized calcium, vitamin D. Supplement as necessary. Will start cinicalcet 30  mg daily if levels are too high-- currently asymptomatic. High risk for complications given low mobility and age. Not a surgical candidate.   2. Type 2 diabetes mellitus with other specified complication, without long-term current use of insulin (HCC) Well-controlled at this time. F/u repeat A1c. Continue Glucophage and glipizide. Neurontin for neuropathic pain.   3. Paroxysmal atrial fibrillation (HCC) Well-controlled at this time. Continue Pradaxa and metoprolol.  4. PAD (peripheral artery disease) (HCC) No symptoms at this time continue pradaxa.   5. 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 Recently had OT eval for positioning which is much improved. Continue supportive care.   6. Recurrent major depressive disorder, in partial remission (HCC) Mood Stable. Continue Paxil.   7. Primary hypertension BP well-controlled. Continue current regimen.  8. Hx of DVT Continue Pradaxa. No signs of bleeding currently.    Family/ staff Communication: Nursing  Labs/tests ordered:  Vitamin D, PTH, Calcium, CMP  I spent greater than 40 minutes for the care of this patient in face to face time, chart review, clinical documentation, patient education.

## 2022-10-15 NOTE — Progress Notes (Deleted)
Location:  Other [place].  Nursing Home Room Number: [facility] [address] Place of Service:  SNF [phone]) Provider:  Earnestine Mealing, MD  Patient Care Team: Earnestine Mealing, MD as PCP - General (Family Medicine) Lemar Livings Merrily Pew, MD (General Surgery) Jeralyn Ruths, MD as Consulting Physician (Oncology)  Extended Emergency Contact Information Primary Emergency Contact: Julie Acosta Address: [address]          [place], [place] [address] D[place] of [place] Home Phone: [phone] Mobile Phone: [phone] Relation: Daughter  Code Status:  DNR Goals of care: Advanced Directive information    10/15/2022    9:29 AM  Advanced Directives  Does Patient Have a Medical Advance Directive? Yes  Type of Advance Directive Out of facility DNR (pink MOST or yellow form)  Does patient want to make changes to medical advance directive? No - Patient declined     Chief Complaint  Patient presents with   Medical Management of Chronic Issues    Medical Management of Chronic Issues.     HPI:  Pt is a 89 y.o. female seen today for medical management of chronic diseases.     Past Medical History:  Diagnosis Date   Anxiety    Benign breast lumps    Blood clotting disorder (HCC)    Bone cancer (HCC)    head of femur, left leg ... 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  Oncotype recurrence score: 1.  DCIS, margin less than 0.5 mm.  Negative sentinel node.  Accelerated partial breast radiation.   Cervicalgia    Chronic kidney disease    kidney stones   Colon cancer [facility])    patient unaware of this   Complication of anesthesia    mood alteration / not sure if d/t pain medicine or anesthesia as she passed out    Cough    NASAL DRIP / SNEEZING / SORE THROAT MOSTLY CONSTANT   Depression    Diabetes (HCC)    Diverticulitis    Dizzy    GERD  (gastroesophageal reflux disease)    H/O blood clots    arm and leg   HBP (high blood pressure)    Hematuria    gross   Hemorrhoids    HLD (hyperlipidemia)    Microscopic hematuria 06/02/2015   Muscle pain    Osteoarthritis    Osteoporosis    Panic disorder    Personal history of radiation therapy    PND (post-nasal drip)    CHRONIC WITH SORE THROAT AND SNEEZING   Reflux    Sepsis (HCC)    Sepsis, unspecified organism (HCC) 12/15/2021   Formatting of this note might be different from the original.  Last Assessment & Plan: Formatting of this note might be different from the original. Appears to be multifactorial and secondary to healthcare associated pneumonia as well as a UTI CT angio shows asymmetric airspace disease at the left base and posteriorly in the right upper lobe concerning for pneumonia Patient also has pyuria Patient   Severe sepsis (HCC) 12/15/2021   Skin cancer    nose   Swelling    Tremor    Tremors of nervous system    Ureteral stone with hydronephrosis 11/26/2018   Past Surgical History:  Procedure Laterality Date   APPENDECTOMY  1962   BREAST BIOPSY Right 2006?   benign  BREAST BIOPSY Left 2019   invasive mammary carcinoma   BREAST CYST EXCISION Left 07/27/2017   Procedure: SKIN CYST EXCISED;  Surgeon: Robert Bellow, MD;  Location: [facility] ORS;  Service: General;  Laterality: Left;   BREAST LUMPECTOMY Left 07/2017   invasive mammary, DCIS  mammosite   BREAST SURGERY Right 2019   CARPAL TUNNEL RELEASE Right    CATARACT EXTRACTION W/PHACO Left 11/11/2016   Procedure: CATARACT EXTRACTION PHACO AND INTRAOCULAR LENS PLACEMENT ([place]);  Surgeon: Birder Robson, MD;  Location: [facility] ORS;  Service: Ophthalmology;  Laterality: Left;  [place] 01:07.9 AP% 19.2 CDE 13.02 Fluid pack lot # [id number]   CATARACT EXTRACTION W/PHACO Right 12/09/2016   Procedure: CATARACT EXTRACTION PHACO AND INTRAOCULAR LENS PLACEMENT (IOC);  Surgeon: Birder Robson, MD;  Location: [facility]  ORS;  Service: Ophthalmology;  Laterality: Right;  [place] 00:49 AP% 21.2 CDE 10.39 Fluid pack lot # [id number]   CHOLECYSTECTOMY     COLONOSCOPY WITH PROPOFOL N/A 11/22/2014   Procedure: COLONOSCOPY WITH PROPOFOL;  Surgeon: Manya Silvas, MD;  Location: [facility] ENDOSCOPY;  Service: Endoscopy;  Laterality: N/A;   CYSTOSCOPY WITH STENT PLACEMENT Bilateral 11/27/2018   Procedure: CYSTOSCOPY WITH STENT PLACEMENT;  Surgeon: Festus Aloe, MD;  Location: [facility] ORS;  Service: Urology;  Laterality: Bilateral;   CYSTOSCOPY/URETEROSCOPY/HOLMIUM LASER/STENT PLACEMENT Bilateral 12/17/2018   Procedure: CYSTOSCOPY/URETEROSCOPY/HOLMIUM LASER/STENT Exchange;  Surgeon: Billey Co, MD;  Location: [facility] ORS;  Service: Urology;  Laterality: Bilateral;   ESOPHAGOGASTRODUODENOSCOPY  11/22/2014   Procedure: ESOPHAGOGASTRODUODENOSCOPY (EGD);  Surgeon: Manya Silvas, MD;  Location: [facility] ENDOSCOPY;  Service: Endoscopy;;   ESOPHAGOGASTRODUODENOSCOPY (EGD) WITH PROPOFOL N/A 03/05/2017   Procedure: ESOPHAGOGASTRODUODENOSCOPY (EGD) WITH PROPOFOL;  Surgeon: Lin Landsman, MD;  Location: [facility] ENDOSCOPY;  Service: Gastroenterology;  Laterality: N/A;   EXTRACORPOREAL SHOCK WAVE LITHOTRIPSY Right 03/04/2018   Procedure: EXTRACORPOREAL SHOCK WAVE LITHOTRIPSY (ESWL);  Surgeon: Billey Co, MD;  Location: [facility] ORS;  Service: Urology;  Laterality: Right;   EYE SURGERY Bilateral 2012   cataract extraction with iol   GALLBLADDER SURGERY  1968   hemi pelvectomy     left side at age 62   HEMIPELVIC Left [place] Left    AMPUTATION d/t cancer at 89 years old   MASTECTOMY, PARTIAL Left 07/27/2017   Procedure: MASTECTOMY PARTIAL;  Surgeon: Robert Bellow, MD;  Location: [facility] ORS;  Service: General;  Laterality: Left;   MOUTH SURGERY  2019   teeth pulled with a bridge insertion.   fell out 12/16/18   OVARY SURGERY Right    cyst removed   SAVORY DILATION  11/22/2014   Procedure: SAVORY DILATION;  Surgeon:  Manya Silvas, MD;  Location: [facility] ENDOSCOPY;  Service: Endoscopy;;   SENTINEL NODE BIOPSY Left 07/27/2017   Procedure: SENTINEL NODE BIOPSY;  Surgeon: Robert Bellow, MD;  Location: [facility] ORS;  Service: General;  Laterality: Left;   SKIN CANCER EXCISION     nose and arm and face   TEE WITHOUT CARDIOVERSION N/A 07/25/2019   Procedure: TRANSESOPHAGEAL ECHOCARDIOGRAM (TEE);  Surgeon: Corey Skains, MD;  Location: [facility] ORS;  Service: Cardiovascular;  Laterality: N/A;   TONSILLECTOMY      Allergies  Allergen Reactions   Ciprofloxacin Hives   Codeine Other (See Comments)    HALLUCINATIONS   Tape     Rash and skin irritation/ paper tape and tegaderm OK   Ciprocinonide [Fluocinolone] Other (See Comments)    unknown   Amoxicillin Other (See Comments)  Upset stomach Has patient had a PCN reaction causing immediate rash, facial/tongue/throat swelling, SOB or lightheadedness with hypotension: No Has patient had a PCN reaction causing severe rash involving mucus membranes or skin necrosis: No Has patient had a PCN reaction that required hospitalization: No Has patient had a PCN reaction occurring within the last 10 years: Yes If all of the above answers are "NO", then may proceed with Cephalosporin use.;   Cefuroxime Other (See Comments)    OK INTRACAMERALLY PER DR WLP, upset stomach   Latex Rash    Rast test NEGATIVE   Lipitor [Atorvastatin] Other (See Comments)    unknown    Outpatient Encounter Medications as of 10/15/2022  Medication Sig   acetaminophen (TYLENOL) 650 MG CR tablet Take 650 mg by mouth every 8 (eight) hours as needed for pain. Give one tablet by mouth three times daily.   dextromethorphan-guaiFENesin (ROBITUSSIN-DM) 10-100 MG/5ML liquid Take 10 mLs by mouth every 4 (four) hours as needed for cough.   diclofenac Sodium (VOLTAREN) 1 % GEL Apply 4 g topically. Apply to stump topically every 6 hours as needed. Apply to right knee topically three times daily for pain.    gabapentin (NEURONTIN) 100 MG capsule Take 100 mg by mouth 3 (three) times daily.   GLIPIZIDE XL 2.5 MG 24 hr tablet Take 2.5 mg by mouth daily.    hydrocortisone 2.5 % cream Apply to Hemorrhoids/buttucks topically every 8 hours as needed for hemorrhoids.   hydrocortisone cream 1 % Apply to rectum topically every 6 hours as needed   Infant Care Products (DERMACLOUD) OINT Apply to Sacrum topically every shift for prevention   loratadine (CLARITIN) 10 MG tablet Take 10 mg by mouth daily.   melatonin 3 MG TABS tablet Take 3 mg by mouth at bedtime.   metFORMIN (GLUCOPHAGE) 500 MG tablet Take 750 mg by mouth 2 (two) times daily with a meal.   metoprolol tartrate (LOPRESSOR) 25 MG tablet Take 1 tablet (25 mg total) by mouth 3 (three) times daily.   Multiple Vitamin (MULTIVITAMIN WITH MINERALS) TABS tablet Take 1 tablet by mouth daily.   omeprazole (PRILOSEC) 40 MG capsule Take 40 mg by mouth daily.    ondansetron (ZOFRAN) 4 MG tablet Take 4 mg by mouth every 4 (four) hours as needed for nausea or vomiting.   OXYGEN 2lpm as needed for sats less than 88% every 1 hour as needed   PARoxetine (PAXIL) 30 MG tablet Take 30 mg by mouth daily.   Polyethyl Glycol-Propyl Glycol 0.4-0.3 % SOLN Apply to eye 2 (two) times daily as needed.   polyethylene glycol (MIRALAX / GLYCOLAX) 17 g packet Take 17 g by mouth daily as needed.   PRADAXA 150 MG CAPS capsule TAKE 1 CAPSULE BY MOUTH TWICE DAILY START TREATMENT 10 HRS LAST DOSE OF LOVENOX   tiZANidine (ZANAFLEX) 2 MG tablet Take 2 mg by mouth every 12 (twelve) hours as needed for muscle spasms.   Zinc Oxide (TRIPLE PASTE) 12.8 % ointment Apply to sacrum/buttocks topically every shift.   [DISCONTINUED] letrozole (FEMARA) 2.5 MG tablet TAKE ONE TABLET BY MOUTH EVERY DAY   No facility-administered encounter medications on file as of 10/15/2022.    Review of Systems  Immunization History  Administered Date(s) Administered   Covid-19, Mrna,Vaccine(Spikevax)13yrs  and older 08/12/2022   Influenza-Unspecified 02/03/2020, 02/18/2021, 02/18/2022   Pneumococcal Conjugate-13 09/28/2017   Unspecified SARS-COV-2 Vaccination 05/20/2019, 06/17/2019, 03/16/2020, 09/20/2020, 01/25/2021   Pertinent  Health Maintenance Due  Topic Date Due  FOOT EXAM  09/11/2022   OPHTHALMOLOGY EXAM  07/31/2023 (Originally 04/29/1944)   INFLUENZA VACCINE  12/04/2022   HEMOGLOBIN A1C  03/06/2023   DEXA SCAN  Completed      12/18/2021    9:00 AM 12/18/2021    7:45 PM 12/19/2021    7:00 AM 12/19/2021   12:00 PM 01/24/2022   11:20 AM  Fall Risk  (RETIRED) Patient Fall Risk Level High fall risk High fall risk High fall risk High fall risk High fall risk   Functional Status Survey:    Vitals:   10/15/22 0915 10/15/22 0930  BP: (!) 143/66 139/66  Pulse: 72   Resp: 18   Temp: 98.1 F (36.7 C)   SpO2: (!) 89%   Weight: 165 lb 9.6 oz (75.1 kg)   Height: 5\' 3"  (1.6 m)    Body mass index is 29.33 kg/m. Physical Exam  Labs reviewed: Recent Labs    12/17/21 0533 12/18/21 1207 12/19/21 0621 01/24/22 1051 01/24/22 1157 01/31/22 1053 03/05/22 0000 06/26/22 0000 08/04/22 1033 08/11/22 0000  NA 136  --    < > 143  --  137   < > 137 139 142  K 3.5  --    < > 4.7  --  4.8   < > 4.9 4.0 4.7  CL 109  --    < > 107  --  110   < > 102 105 108  CO2 20*  --    < > 31  --  23   < > 28* 27 27*  GLUCOSE 199*  --    < > 198*  --  236*  --   --  181*  --   BUN 28*  --    < > 22  --  22   < > 23* 17 17  CREATININE 0.61  --    < > 0.69  --  0.74   < > 0.5 0.72 0.5  CALCIUM 9.5  --    < > 11.8*   < > 9.7   < > 11.1* 10.8* 9.2  MG 1.5* 1.8  --   --   --   --   --   --   --   --    < > = values in this interval not displayed.   Recent Labs    12/15/21 1011 12/17/21 0533 03/05/22 0000 06/26/22 0000  AST 33 25 18 19   ALT 17 26 10 13   ALKPHOS 43 45 65 58  BILITOT 0.5 0.5  --   --   PROT 6.8 6.0*  --   --   ALBUMIN 3.3* 2.7* 4.0 3.8   Recent Labs    12/17/21 0533  01/24/22 1051 01/24/22 1051 03/05/22 0000 06/26/22 0000 07/10/22 0000 08/04/22 1033  WBC 8.6 9.3   < > 8.6 8.5 9.2 8.9  NEUTROABS  --  5.9  --  5,616.00 5,347.00  --  6.3  HGB 10.9* 12.2  --  13.1 13.8 14.1 13.4  HCT 36.4 41.0  --  42 43 44 43.0  MCV 81.3 86.0  --   --   --   --  88.5  PLT 125* 218  --  205 173 165 229   < > = values in this interval not displayed.   Lab Results  Component Value Date   TSH 0.03 (A) 08/14/2022   Lab Results  Component Value Date   HGBA1C 7.1 09/03/2022   Lab Results  Component Value Date   CHOL 110 12/15/2021   HDL 19 (L) 12/15/2021   LDLCALC 46 12/15/2021   TRIG 223 (H) 12/15/2021   CHOLHDL 5.8 12/15/2021    Significant Diagnostic Results in last 30 days:  No results found.  Assessment/Plan There are no diagnoses linked to this encounter.   Family/ staff Communication: ***  Labs/tests ordered:  ***

## 2022-10-16 LAB — COMPREHENSIVE METABOLIC PANEL
Albumin: 4.1 (ref 3.5–5.0)
Calcium: 11.1 — AB (ref 8.7–10.7)
Globulin: 2.9
eGFR: 92

## 2022-10-16 LAB — BASIC METABOLIC PANEL
BUN: 16 (ref 4–21)
CO2: 26 — AB (ref 13–22)
Chloride: 104 (ref 99–108)
Creatinine: 0.5 (ref 0.5–1.1)
Glucose: 156
Potassium: 4.3 mEq/L (ref 3.5–5.1)
Sodium: 139 (ref 137–147)

## 2022-10-16 LAB — HEPATIC FUNCTION PANEL
ALT: 13 U/L (ref 7–35)
AST: 18 (ref 13–35)
Alkaline Phosphatase: 55 (ref 25–125)
Bilirubin, Total: 0.4

## 2022-10-16 LAB — VITAMIN D 25 HYDROXY (VIT D DEFICIENCY, FRACTURES): Vit D, 25-Hydroxy: 38

## 2022-10-21 ENCOUNTER — Encounter: Payer: Self-pay | Admitting: Student

## 2022-10-22 ENCOUNTER — Encounter: Payer: Self-pay | Admitting: Student

## 2022-10-22 ENCOUNTER — Telehealth: Payer: Self-pay | Admitting: Internal Medicine

## 2022-10-22 ENCOUNTER — Non-Acute Institutional Stay (SKILLED_NURSING_FACILITY): Payer: Medicare Other | Admitting: Student

## 2022-10-22 DIAGNOSIS — E213 Hyperparathyroidism, unspecified: Secondary | ICD-10-CM

## 2022-10-22 DIAGNOSIS — R404 Transient alteration of awareness: Secondary | ICD-10-CM | POA: Diagnosis not present

## 2022-10-22 NOTE — Progress Notes (Signed)
Location:  Other [facility].  Nursing Home Room Number: [facility] [address] Place of Service:  SNF [phone]) Provider:  Earnestine Mealing, MD  Patient Care Team: Earnestine Mealing, MD as PCP - General (Family Medicine) Julie Acosta Julie Pew, MD (General Surgery) Julie Ruths, MD as Consulting Physician (Oncology)  Extended Emergency Contact Information Primary Emergency Contact: Julie Acosta Address: [address]          [place], [place] [address] [place] of [place] Home Phone: [phone] Mobile Phone: [phone] Relation: Daughter  Code Status:  DNR Goals of care: Advanced Directive information    10/22/2022   11:14 AM  Advanced Directives  Does Patient Have a Medical Advance Directive? Yes  Type of Advance Directive Out of facility DNR (pink MOST or yellow form)  Does patient want to make changes to medical advance directive? No - Patient declined     Chief Complaint  Patient presents with   Acute Visit    Changes in Mentation.     HPI:  Julie Acosta is a 89 y.o. female seen today for an acute visit for Changes in Mentation.   Patient woke up normal with normal speech, however, during OT words became word salad.   Patient is speaking clearly and denies headache, chest pain, shortness of breath. She states she will not get a CT Scan  Called patient's daughter to discuss GOC: does not want to go to the hospital. Continue to monitor. Labs as indicated.    Past Medical History:  Diagnosis Date   Anxiety    Benign breast lumps    Blood clotting disorder (HCC)    Bone cancer (HCC)    head of femur, left leg ... 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  Oncotype recurrence score: 1.  DCIS, margin less than 0.5 mm.  Negative sentinel node.  Accelerated partial breast radiation.   Cervicalgia    Chronic kidney disease    kidney stones   Colon  cancer [facility])    patient unaware of this   Complication of anesthesia    mood alteration / not sure if d/t pain medicine or anesthesia as she passed out    Cough    NASAL DRIP / SNEEZING / SORE THROAT MOSTLY CONSTANT   Depression    Diabetes (HCC)    Diverticulitis    Dizzy    GERD (gastroesophageal reflux disease)    H/O blood clots    arm and leg   HBP (high blood pressure)    Hematuria    gross   Hemorrhoids    HLD (hyperlipidemia)    Microscopic hematuria 06/02/2015   Muscle pain    Osteoarthritis    Osteoporosis    Panic disorder    Personal history of radiation therapy    PND (post-nasal drip)    CHRONIC WITH SORE THROAT AND SNEEZING   Reflux    Sepsis (HCC)    Sepsis, unspecified organism (HCC) 12/15/2021   Formatting of this note might be different from the original.  Last Assessment & Plan: Formatting of this note might be different from the original. Appears to be multifactorial and secondary to healthcare associated pneumonia as well as a UTI CT angio shows asymmetric airspace disease at the left base and posteriorly in the right upper lobe concerning for pneumonia Patient also has pyuria Patient   Severe sepsis (HCC) 12/15/2021  Skin cancer    nose   Swelling    Tremor    Tremors of nervous system    Ureteral stone with hydronephrosis 11/26/2018   Past Surgical History:  Procedure Laterality Date   APPENDECTOMY  1962   BREAST BIOPSY Right 2006?   benign   BREAST BIOPSY Left 2019   invasive mammary carcinoma   BREAST CYST EXCISION Left 07/27/2017   Procedure: SKIN CYST EXCISED;  Surgeon: Earline Mayotte, MD;  Location: [facility] ORS;  Service: General;  Laterality: Left;   BREAST LUMPECTOMY Left 07/2017   invasive mammary, DCIS  mammosite   BREAST SURGERY Right 2019   CARPAL TUNNEL RELEASE Right    CATARACT EXTRACTION W/PHACO Left 11/11/2016   Procedure: CATARACT EXTRACTION PHACO AND INTRAOCULAR LENS PLACEMENT (IOC);  Surgeon: Galen Manila, MD;   Location: [facility] ORS;  Service: Ophthalmology;  Laterality: Left;  [place] 01:07.9 AP% 19.2 CDE 13.02 Fluid pack lot # [id number]   CATARACT EXTRACTION W/PHACO Right 12/09/2016   Procedure: CATARACT EXTRACTION PHACO AND INTRAOCULAR LENS PLACEMENT (IOC);  Surgeon: Galen Manila, MD;  Location: [facility] ORS;  Service: Ophthalmology;  Laterality: Right;  [place] 00:49 AP% 21.2 CDE 10.39 Fluid pack lot # [id number]   CHOLECYSTECTOMY     COLONOSCOPY WITH PROPOFOL N/A 11/22/2014   Procedure: COLONOSCOPY WITH PROPOFOL;  Surgeon: Scot Jun, MD;  Location: [facility] ENDOSCOPY;  Service: Endoscopy;  Laterality: N/A;   CYSTOSCOPY WITH STENT PLACEMENT Bilateral 11/27/2018   Procedure: CYSTOSCOPY WITH STENT PLACEMENT;  Surgeon: Jerilee Field, MD;  Location: [facility] ORS;  Service: Urology;  Laterality: Bilateral;   CYSTOSCOPY/URETEROSCOPY/HOLMIUM LASER/STENT PLACEMENT Bilateral 12/17/2018   Procedure: CYSTOSCOPY/URETEROSCOPY/HOLMIUM LASER/STENT Exchange;  Surgeon: Sondra Come, MD;  Location: [facility] ORS;  Service: Urology;  Laterality: Bilateral;   ESOPHAGOGASTRODUODENOSCOPY  11/22/2014   Procedure: ESOPHAGOGASTRODUODENOSCOPY (EGD);  Surgeon: Scot Jun, MD;  Location: [facility] ENDOSCOPY;  Service: Endoscopy;;   ESOPHAGOGASTRODUODENOSCOPY (EGD) WITH PROPOFOL N/A 03/05/2017   Procedure: ESOPHAGOGASTRODUODENOSCOPY (EGD) WITH PROPOFOL;  Surgeon: Toney Reil, MD;  Location: [facility] ENDOSCOPY;  Service: Gastroenterology;  Laterality: N/A;   EXTRACORPOREAL SHOCK WAVE LITHOTRIPSY Right 03/04/2018   Procedure: EXTRACORPOREAL SHOCK WAVE LITHOTRIPSY (ESWL);  Surgeon: Sondra Come, MD;  Location: [facility] ORS;  Service: Urology;  Laterality: Right;   EYE SURGERY Bilateral 2012   cataract extraction with iol   GALLBLADDER SURGERY  1968   hemi pelvectomy     left side at age 55   HEMIPELVIC Left 1962   LEG SURGERY Left    AMPUTATION d/t cancer at 89 years old   MASTECTOMY, PARTIAL Left 07/27/2017   Procedure: MASTECTOMY  PARTIAL;  Surgeon: Earline Mayotte, MD;  Location: [facility] ORS;  Service: General;  Laterality: Left;   MOUTH SURGERY  2019   teeth pulled with a bridge insertion.   fell out 12/16/18   OVARY SURGERY Right    cyst removed   SAVORY DILATION  11/22/2014   Procedure: SAVORY DILATION;  Surgeon: Scot Jun, MD;  Location: [facility] ENDOSCOPY;  Service: Endoscopy;;   SENTINEL NODE BIOPSY Left 07/27/2017   Procedure: SENTINEL NODE BIOPSY;  Surgeon: Earline Mayotte, MD;  Location: [facility] ORS;  Service: General;  Laterality: Left;   SKIN CANCER EXCISION     nose and arm and face   TEE WITHOUT CARDIOVERSION N/A 07/25/2019   Procedure: TRANSESOPHAGEAL ECHOCARDIOGRAM (TEE);  Surgeon: Lamar Blinks, MD;  Location: [facility] ORS;  Service: Cardiovascular;  Laterality: N/A;   TONSILLECTOMY      Allergies  Allergen Reactions   Ciprofloxacin Hives   Codeine Other (See Comments)    HALLUCINATIONS   Tape     Rash and skin irritation/ paper tape and tegaderm OK   Ciprocinonide [Fluocinolone] Other (See Comments)    unknown   Amoxicillin Other (See Comments)    Upset stomach Has patient had a PCN reaction causing immediate rash, facial/tongue/throat swelling, SOB or lightheadedness with hypotension: No Has patient had a PCN reaction causing severe rash involving mucus membranes or skin necrosis: No Has patient had a PCN reaction that required hospitalization: No Has patient had a PCN reaction occurring within the last 10 years: Yes If all of the above answers are "NO", then may proceed with Cephalosporin use.;   Cefuroxime Other (See Comments)    OK INTRACAMERALLY PER DR WLP, upset stomach   Latex Rash    Rast test NEGATIVE   Lipitor [Atorvastatin] Other (See Comments)    unknown    Outpatient Encounter Medications as of 10/22/2022  Medication Sig   acetaminophen (TYLENOL) 650 MG CR tablet Take 650 mg by mouth every 8 (eight) hours as needed for pain. Give one tablet by mouth three times daily.    Cholecalciferol (VITAMIN D3) 20 MCG (800 UNIT) TABS Take 1 tablet by mouth daily.   cinacalcet (SENSIPAR) 30 MG tablet Take 30 mg by mouth at bedtime.   dextromethorphan-guaiFENesin (ROBITUSSIN-DM) 10-100 MG/5ML liquid Take 10 mLs by mouth every 4 (four) hours as needed for cough.   diclofenac Sodium (VOLTAREN) 1 % GEL Apply 4 g topically. Apply to stump topically every 6 hours as needed. Apply to right knee topically three times daily for pain.   gabapentin (NEURONTIN) 100 MG capsule Take 100 mg by mouth 3 (three) times daily.   GLIPIZIDE XL 2.5 MG 24 hr tablet Take 2.5 mg by mouth daily.    hydrocortisone 2.5 % cream Apply to Hemorrhoids/buttucks topically every 8 hours as needed for hemorrhoids.   hydrocortisone cream 1 % Apply to rectum topically every 6 hours as needed   Infant Care Products (DERMACLOUD) OINT Apply to Sacrum topically every shift for prevention   loratadine (CLARITIN) 10 MG tablet Take 10 mg by mouth daily.   melatonin 3 MG TABS tablet Take 3 mg by mouth at bedtime.   metFORMIN (GLUCOPHAGE) 500 MG tablet Take 750 mg by mouth 2 (two) times daily with a meal.   metoprolol tartrate (LOPRESSOR) 25 MG tablet Take 1 tablet (25 mg total) by mouth 3 (three) times daily.   Multiple Vitamin (MULTIVITAMIN WITH MINERALS) TABS tablet Take 1 tablet by mouth daily.   omeprazole (PRILOSEC) 40 MG capsule Take 40 mg by mouth daily.    ondansetron (ZOFRAN) 4 MG tablet Take 4 mg by mouth every 4 (four) hours as needed for nausea or vomiting.   OXYGEN 2lpm as needed for sats less than 88% every 1 hour as needed   PARoxetine (PAXIL) 30 MG tablet Take 30 mg by mouth daily.   Polyethyl Glycol-Propyl Glycol 0.4-0.3 % SOLN Apply to eye 2 (two) times daily as needed.   polyethylene glycol (MIRALAX / GLYCOLAX) 17 g packet Take 17 g by mouth daily as needed.   PRADAXA 150 MG CAPS capsule TAKE 1 CAPSULE BY MOUTH TWICE DAILY START TREATMENT 10 HRS LAST DOSE OF LOVENOX   tiZANidine (ZANAFLEX) 2 MG tablet  Take 2 mg by mouth every 12 (twelve) hours as needed for muscle spasms.   Zinc Oxide (TRIPLE PASTE) 12.8 % ointment Apply to sacrum/buttocks  topically every shift.   No facility-administered encounter medications on file as of 10/22/2022.    Review of Systems  Immunization History  Administered Date(s) Administered   Covid-19, Mrna,Vaccine(Spikevax)76yrs and older 08/12/2022   Influenza-Unspecified 02/03/2020, 02/18/2021, 02/18/2022   Pneumococcal Conjugate-13 09/28/2017   Unspecified SARS-COV-2 Vaccination 05/20/2019, 06/17/2019, 03/16/2020, 09/20/2020, 01/25/2021   Pertinent  Health Maintenance Due  Topic Date Due   FOOT EXAM  09/11/2022   OPHTHALMOLOGY EXAM  07/31/2023 (Originally 04/29/1944)   INFLUENZA VACCINE  12/04/2022   HEMOGLOBIN A1C  03/06/2023   DEXA SCAN  Completed      12/18/2021    9:00 AM 12/18/2021    7:45 PM 12/19/2021    7:00 AM 12/19/2021   12:00 PM 01/24/2022   11:20 AM  Fall Risk  (RETIRED) Patient Fall Risk Level High fall risk High fall risk High fall risk High fall risk High fall risk   Functional Status Survey:    Vitals:   10/22/22 1104  BP: 135/60  Pulse: (!) 51  Resp: 18  Temp: 98.1 F (36.7 C)  SpO2: (!) 89%  Weight: 165 lb 9.6 oz (75.1 kg)  Height: 5\' 3"  (1.6 m)   Body mass index is 29.33 kg/m. Physical Exam Constitutional:      Appearance: She is obese.  Cardiovascular:     Rate and Rhythm: Normal rate.     Pulses: Normal pulses.  Pulmonary:     Effort: Pulmonary effort is normal.  Abdominal:     General: Abdomen is flat.     Palpations: Abdomen is soft.  Neurological:     Mental Status: She is alert and oriented to person, place, and time. Mental status is at baseline.     Labs reviewed: Recent Labs    12/17/21 0533 12/18/21 1207 12/19/21 0621 01/24/22 1051 01/24/22 1157 01/31/22 1053 03/05/22 0000 08/04/22 1033 08/11/22 0000 10/16/22 0000  NA 136  --    < > 143  --  137   < > 139 142 139  K 3.5  --    < > 4.7   --  4.8   < > 4.0 4.7 4.3  CL 109  --    < > 107  --  110   < > 105 108 104  CO2 20*  --    < > 31  --  23   < > 27 27* 26*  GLUCOSE 199*  --    < > 198*  --  236*  --  181*  --   --   BUN 28*  --    < > 22  --  22   < > 17 17 16   CREATININE 0.61  --    < > 0.69  --  0.74   < > 0.72 0.5 0.5  CALCIUM 9.5  --    < > 11.8*   < > 9.7   < > 10.8* 9.2 11.1*  MG 1.5* 1.8  --   --   --   --   --   --   --   --    < > = values in this interval not displayed.   Recent Labs    12/15/21 1011 12/17/21 0533 03/05/22 0000 06/26/22 0000 10/16/22 0000  AST 33 25 18 19 18   ALT 17 26 10 13 13   ALKPHOS 43 45 65 58 55  BILITOT 0.5 0.5  --   --   --   PROT 6.8 6.0*  --   --   --  ALBUMIN 3.3* 2.7* 4.0 3.8 4.1   Recent Labs    12/17/21 0533 01/24/22 1051 01/24/22 1051 03/05/22 0000 06/26/22 0000 07/10/22 0000 08/04/22 1033  WBC 8.6 9.3   < > 8.6 8.5 9.2 8.9  NEUTROABS  --  5.9  --  5,616.00 5,347.00  --  6.3  HGB 10.9* 12.2  --  13.1 13.8 14.1 13.4  HCT 36.4 41.0  --  42 43 44 43.0  MCV 81.3 86.0  --   --   --   --  88.5  PLT 125* 218  --  205 173 165 229   < > = values in this interval not displayed.   Lab Results  Component Value Date   TSH 0.03 (A) 08/14/2022   Lab Results  Component Value Date   HGBA1C 7.1 09/03/2022   Lab Results  Component Value Date   CHOL 110 12/15/2021   HDL 19 (L) 12/15/2021   LDLCALC 46 12/15/2021   TRIG 223 (H) 12/15/2021   CHOLHDL 5.8 12/15/2021    Significant Diagnostic Results in last 30 days:  No results found.  Assessment/Plan Transient alteration of awareness  Hyperparathyroidism [facility]) Patient had a rapid recovery to baseline after moments of word salad caught on video. Family and patient would prefer not to have further imaging and continue supportive care in the facility. CTM. Some concern this could be a medication side effect given she has had 2 doses of cinicalcet with some GI discomfort. Discontinue cinicalcet at this time. Continue  monitoring Ca++ levels. Discussed patient's hyperparathyroidism and risks with daughter. Supportive care at this time.   Family/ staff Communication: nursing, daughter.   Labs/tests ordered:  CMP monthly

## 2022-10-22 NOTE — Telephone Encounter (Signed)
Nurse from [facility] called Patient had work up due to some confusion and Low POX Chest Xray shows Left Lower Lobe atelectasis/ Pneumonia She has no cough or fever POX is 90% on 2 l Of oxygen CBC pending Will have care team follow tomorrow.

## 2022-10-30 LAB — HM DIABETES EYE EXAM

## 2022-12-01 LAB — CBC AND DIFFERENTIAL
HCT: 41 (ref 36–46)
Hemoglobin: 13.6 (ref 12.0–16.0)
Neutrophils Absolute: 4360
Platelets: 197 10*3/uL (ref 150–400)
WBC: 7.8

## 2022-12-01 LAB — CBC: RBC: 4.58 (ref 3.87–5.11)

## 2022-12-09 ENCOUNTER — Non-Acute Institutional Stay (SKILLED_NURSING_FACILITY): Payer: Medicare Other | Admitting: Nurse Practitioner

## 2022-12-09 ENCOUNTER — Encounter: Payer: Self-pay | Admitting: Nurse Practitioner

## 2022-12-09 DIAGNOSIS — I480 Paroxysmal atrial fibrillation: Secondary | ICD-10-CM

## 2022-12-09 DIAGNOSIS — E1169 Type 2 diabetes mellitus with other specified complication: Secondary | ICD-10-CM

## 2022-12-09 DIAGNOSIS — G548 Other nerve root and plexus disorders: Secondary | ICD-10-CM | POA: Diagnosis not present

## 2022-12-09 DIAGNOSIS — I10 Essential (primary) hypertension: Secondary | ICD-10-CM | POA: Diagnosis not present

## 2022-12-09 DIAGNOSIS — M1711 Unilateral primary osteoarthritis, right knee: Secondary | ICD-10-CM

## 2022-12-09 DIAGNOSIS — R7989 Other specified abnormal findings of blood chemistry: Secondary | ICD-10-CM

## 2022-12-09 DIAGNOSIS — R52 Pain, unspecified: Secondary | ICD-10-CM

## 2022-12-09 DIAGNOSIS — K219 Gastro-esophageal reflux disease without esophagitis: Secondary | ICD-10-CM

## 2022-12-09 DIAGNOSIS — E041 Nontoxic single thyroid nodule: Secondary | ICD-10-CM

## 2022-12-09 NOTE — Progress Notes (Unsigned)
Location:  Other [place].  Nursing Home Room Number: [facility] [address] Place of Service:  SNF [phone]) Abbey Chatters, NP  PCP: Earnestine Mealing, MD  Patient Care Team: Earnestine Mealing, MD as PCP - General (Family Medicine) Lemar Livings, Merrily Pew, MD (General Surgery) Jeralyn Ruths, MD as Consulting Physician (Oncology)  Extended Emergency Contact Information Primary Emergency Contact: [name] Address: [address]          [place], [place] [address] [place] of [place] Home Phone: [phone] Mobile Phone: [phone] Relation: Daughter  Goals of care: Advanced Directive information    12/09/2022   12:59 PM  Advanced Directives  Does Patient Have a Medical Advance Directive? Yes  Type of Advance Directive Out of facility DNR (pink MOST or yellow form)  Does patient want to make changes to medical advance directive? No - Patient declined     Chief Complaint  Patient presents with   Medical Management of Chronic Issues    Medical Management of Chronic Issues.     HPI:  Pt is a 89 y.o. female seen today for medical management of chronic disease.  Pt with hx of a fib, OA to right knee, ckd, hypertension, DM, PAD.  She continues to report ongoing pain to right knee, using voltaren gel with helps.  Mood has been stable.  No low blood sugars noted. Denies worsening of neuropathy.  Memory seems to be getting worse per staff. Otherwise nursing without acute concerns.     Past Medical History:  Diagnosis Date   Anxiety    Benign breast lumps    Blood clotting disorder (HCC)    Bone cancer (HCC)    head of femur, left leg ... 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  Oncotype recurrence score: 1.  DCIS, margin less than 0.5 mm.  Negative sentinel node.  Accelerated partial breast radiation.   Cervicalgia    Chronic kidney  disease    kidney stones   Colon cancer [facility])    patient unaware of this   Complication of anesthesia    mood alteration / not sure if d/t pain medicine or anesthesia as she passed out    Cough    NASAL DRIP / SNEEZING / SORE THROAT MOSTLY CONSTANT   Depression    Diabetes (HCC)    Diverticulitis    Dizzy    GERD (gastroesophageal reflux disease)    H/O blood clots    arm and leg   HBP (high blood pressure)    Hematuria    gross   Hemorrhoids    HLD (hyperlipidemia)    Microscopic hematuria 06/02/2015   Muscle pain    Osteoarthritis    Osteoporosis    Panic disorder    Personal history of radiation therapy    PND (post-nasal drip)    CHRONIC WITH SORE THROAT AND SNEEZING   Reflux    Sepsis (HCC)    Sepsis, unspecified organism (HCC) 12/15/2021   Formatting of this note might be different from the original.  Last Assessment & Plan: Formatting of this note might be different from the original. Appears to be multifactorial and secondary to healthcare associated pneumonia as well as a UTI CT angio shows asymmetric airspace disease at the left base and posteriorly in the right upper lobe concerning for pneumonia Patient also has pyuria Patient   Severe sepsis (  HCC) 12/15/2021   Skin cancer    nose   Swelling    Tremor    Tremors of nervous system    Ureteral stone with hydronephrosis 11/26/2018   Past Surgical History:  Procedure Laterality Date   APPENDECTOMY  1962   BREAST BIOPSY Right 2006?   benign   BREAST BIOPSY Left 2019   invasive mammary carcinoma   BREAST CYST EXCISION Left 07/27/2017   Procedure: SKIN CYST EXCISED;  Surgeon: Earline Mayotte, MD;  Location: [facility] ORS;  Service: General;  Laterality: Left;   BREAST LUMPECTOMY Left 07/2017   invasive mammary, DCIS  mammosite   BREAST SURGERY Right 2019   CARPAL TUNNEL RELEASE Right    CATARACT EXTRACTION W/PHACO Left 11/11/2016   Procedure: CATARACT EXTRACTION PHACO AND INTRAOCULAR LENS PLACEMENT (IOC);   Surgeon: Galen Manila, MD;  Location: [facility] ORS;  Service: Ophthalmology;  Laterality: Left;  [place] 01:07.9 AP% 19.2 CDE 13.02 Fluid pack lot # [id number]   CATARACT EXTRACTION W/PHACO Right 12/09/2016   Procedure: CATARACT EXTRACTION PHACO AND INTRAOCULAR LENS PLACEMENT (IOC);  Surgeon: Galen Manila, MD;  Location: [facility] ORS;  Service: Ophthalmology;  Laterality: Right;  [place] 00:49 AP% 21.2 CDE 10.39 Fluid pack lot # [id number]   CHOLECYSTECTOMY     COLONOSCOPY WITH PROPOFOL N/A 11/22/2014   Procedure: COLONOSCOPY WITH PROPOFOL;  Surgeon: Scot Jun, MD;  Location: [facility] ENDOSCOPY;  Service: Endoscopy;  Laterality: N/A;   CYSTOSCOPY WITH STENT PLACEMENT Bilateral 11/27/2018   Procedure: CYSTOSCOPY WITH STENT PLACEMENT;  Surgeon: Jerilee Field, MD;  Location: [facility] ORS;  Service: Urology;  Laterality: Bilateral;   CYSTOSCOPY/URETEROSCOPY/HOLMIUM LASER/STENT PLACEMENT Bilateral 12/17/2018   Procedure: CYSTOSCOPY/URETEROSCOPY/HOLMIUM LASER/STENT Exchange;  Surgeon: Sondra Come, MD;  Location: [facility] ORS;  Service: Urology;  Laterality: Bilateral;   ESOPHAGOGASTRODUODENOSCOPY  11/22/2014   Procedure: ESOPHAGOGASTRODUODENOSCOPY (EGD);  Surgeon: Scot Jun, MD;  Location: [facility] ENDOSCOPY;  Service: Endoscopy;;   ESOPHAGOGASTRODUODENOSCOPY (EGD) WITH PROPOFOL N/A 03/05/2017   Procedure: ESOPHAGOGASTRODUODENOSCOPY (EGD) WITH PROPOFOL;  Surgeon: Toney Reil, MD;  Location: [facility] ENDOSCOPY;  Service: Gastroenterology;  Laterality: N/A;   EXTRACORPOREAL SHOCK WAVE LITHOTRIPSY Right 03/04/2018   Procedure: EXTRACORPOREAL SHOCK WAVE LITHOTRIPSY (ESWL);  Surgeon: Sondra Come, MD;  Location: [facility] ORS;  Service: Urology;  Laterality: Right;   EYE SURGERY Bilateral 2012   cataract extraction with iol   GALLBLADDER SURGERY  1968   hemi pelvectomy     left side at age 80   HEMIPELVIC Left 1962   LEG SURGERY Left    AMPUTATION d/t cancer at 89 years old   MASTECTOMY, PARTIAL Left  07/27/2017   Procedure: MASTECTOMY PARTIAL;  Surgeon: Earline Mayotte, MD;  Location: [facility] ORS;  Service: General;  Laterality: Left;   MOUTH SURGERY  2019   teeth pulled with a bridge insertion.   fell out 12/16/18   OVARY SURGERY Right    cyst removed   SAVORY DILATION  11/22/2014   Procedure: SAVORY DILATION;  Surgeon: Scot Jun, MD;  Location: [facility] ENDOSCOPY;  Service: Endoscopy;;   SENTINEL NODE BIOPSY Left 07/27/2017   Procedure: SENTINEL NODE BIOPSY;  Surgeon: Earline Mayotte, MD;  Location: [facility] ORS;  Service: General;  Laterality: Left;   SKIN CANCER EXCISION     nose and arm and face   TEE WITHOUT CARDIOVERSION N/A 07/25/2019   Procedure: TRANSESOPHAGEAL ECHOCARDIOGRAM (TEE);  Surgeon: Lamar Blinks, MD;  Location: [facility] ORS;  Service: Cardiovascular;  Laterality: N/A;   TONSILLECTOMY  Allergies  Allergen Reactions   Ciprofloxacin Hives   Codeine Other (See Comments)    HALLUCINATIONS   Tape     Rash and skin irritation/ paper tape and tegaderm OK   Ciprocinonide [Fluocinolone] Other (See Comments)    unknown   Amoxicillin Other (See Comments)    Upset stomach Has patient had a PCN reaction causing immediate rash, facial/tongue/throat swelling, SOB or lightheadedness with hypotension: No Has patient had a PCN reaction causing severe rash involving mucus membranes or skin necrosis: No Has patient had a PCN reaction that required hospitalization: No Has patient had a PCN reaction occurring within the last 10 years: Yes If all of the above answers are "NO", then may proceed with Cephalosporin use.;   Cefuroxime Other (See Comments)    OK INTRACAMERALLY PER DR WLP, upset stomach   Latex Rash    Rast test NEGATIVE   Lipitor [Atorvastatin] Other (See Comments)    unknown    Outpatient Encounter Medications as of 12/09/2022  Medication Sig   acetaminophen (TYLENOL) 500 MG tablet Take 500 mg by mouth every 6 (six) hours as needed.   acetaminophen (TYLENOL)  650 MG CR tablet Take 650 mg by mouth every 8 (eight) hours as needed for pain. Give one tablet by mouth three times daily.   dextromethorphan-guaiFENesin (ROBITUSSIN-DM) 10-100 MG/5ML liquid Take 10 mLs by mouth every 4 (four) hours as needed for cough.   diclofenac Sodium (VOLTAREN) 1 % GEL Apply 4 g topically. Apply to stump topically every 6 hours as needed. Apply to right knee topically three times daily for pain.   gabapentin (NEURONTIN) 100 MG capsule Take 100 mg by mouth 3 (three) times daily.   GLIPIZIDE XL 2.5 MG 24 hr tablet Take 2.5 mg by mouth daily.    hydrocortisone 2.5 % cream Apply to Hemorrhoids/buttucks topically every 8 hours as needed for hemorrhoids.   hydrocortisone cream 1 % Apply to rectum topically every 6 hours as needed   Infant Care Products (DERMACLOUD) OINT Apply to Sacrum topically every shift for prevention   loratadine (CLARITIN) 10 MG tablet Take 10 mg by mouth daily.   melatonin 3 MG TABS tablet Take 3 mg by mouth at bedtime.   metFORMIN (GLUCOPHAGE) 500 MG tablet Take 750 mg by mouth 2 (two) times daily with a meal.   metoprolol tartrate (LOPRESSOR) 25 MG tablet Take 1 tablet (25 mg total) by mouth 3 (three) times daily.   Multiple Vitamin (MULTIVITAMIN WITH MINERALS) TABS tablet Take 1 tablet by mouth daily.   omeprazole (PRILOSEC) 40 MG capsule Take 40 mg by mouth daily.    ondansetron (ZOFRAN) 4 MG tablet Take 4 mg by mouth every 4 (four) hours as needed for nausea or vomiting.   OXYGEN 2lpm as needed for sats less than 88% every 1 hour as needed   PARoxetine (PAXIL) 30 MG tablet Take 30 mg by mouth daily.   Polyethyl Glycol-Propyl Glycol 0.4-0.3 % SOLN Apply to eye 2 (two) times daily as needed.   polyethylene glycol (MIRALAX / GLYCOLAX) 17 g packet Take 17 g by mouth daily as needed.   PRADAXA 150 MG CAPS capsule TAKE 1 CAPSULE BY MOUTH TWICE DAILY START TREATMENT 10 HRS LAST DOSE OF LOVENOX   tiZANidine (ZANAFLEX) 2 MG tablet Take 2 mg by mouth every 12  (twelve) hours as needed for muscle spasms.   Zinc Oxide (TRIPLE PASTE) 12.8 % ointment Apply to sacrum/buttocks topically every shift.   [DISCONTINUED] Cholecalciferol (VITAMIN D3) 20  MCG (800 UNIT) TABS Take 1 tablet by mouth daily.   [DISCONTINUED] cinacalcet (SENSIPAR) 30 MG tablet Take 30 mg by mouth at bedtime.   No facility-administered encounter medications on file as of 12/09/2022.    Review of Systems  Constitutional:  Negative for activity change, appetite change, fatigue and unexpected weight change.  HENT:  Negative for congestion and hearing loss.   Eyes: Negative.   Respiratory:  Positive for cough. Negative for shortness of breath.   Cardiovascular:  Negative for chest pain, palpitations and leg swelling.  Gastrointestinal:  Negative for abdominal pain, constipation and diarrhea.  Genitourinary:  Negative for difficulty urinating and dysuria.  Musculoskeletal:  Positive for arthralgias and gait problem. Negative for myalgias.  Skin:  Negative for color change and wound.  Neurological:  Positive for weakness. Negative for dizziness.  Psychiatric/Behavioral:  Negative for agitation, behavioral problems and confusion.      Immunization History  Administered Date(s) Administered   Covid-19, Mrna,Vaccine(Spikevax)43yrs and older 08/12/2022   Influenza-Unspecified 02/03/2020, 02/18/2021, 02/18/2022   Pneumococcal Conjugate-13 09/28/2017   Unspecified SARS-COV-2 Vaccination 05/20/2019, 06/17/2019, 03/16/2020, 09/20/2020, 01/25/2021   Pertinent  Health Maintenance Due  Topic Date Due   FOOT EXAM  09/11/2022   INFLUENZA VACCINE  12/04/2022   OPHTHALMOLOGY EXAM  07/31/2023 (Originally 04/29/1944)   HEMOGLOBIN A1C  03/06/2023   DEXA SCAN  Completed      12/18/2021    9:00 AM 12/18/2021    7:45 PM 12/19/2021    7:00 AM 12/19/2021   12:00 PM 01/24/2022   11:20 AM  Fall Risk  (RETIRED) Patient Fall Risk Level High fall risk High fall risk High fall risk High fall risk High  fall risk   Functional Status Survey:    Vitals:   12/09/22 1252 12/09/22 1302  BP: (!) 150/76 (!) 152/70  Pulse: (!) 102   Resp: 18   Temp: 98 F (36.7 C)   SpO2: (!) 89%   Weight: 158 lb (71.7 kg)   Height: 5\' 3"  (1.6 m)    Body mass index is 27.99 kg/m. Physical Exam Constitutional:      General: She is not in acute distress.    Appearance: She is well-developed. She is not diaphoretic.  HENT:     Head: Normocephalic and atraumatic.     Mouth/Throat:     Pharynx: No oropharyngeal exudate.  Eyes:     Conjunctiva/sclera: Conjunctivae normal.     Pupils: Pupils are equal, round, and reactive to light.  Cardiovascular:     Rate and Rhythm: Normal rate and regular rhythm.     Heart sounds: Normal heart sounds.  Pulmonary:     Effort: Pulmonary effort is normal.     Breath sounds: Normal breath sounds.  Abdominal:     General: Bowel sounds are normal.     Palpations: Abdomen is soft.  Musculoskeletal:     Cervical back: Normal range of motion and neck supple.     Right lower leg: No edema.  Skin:    General: Skin is warm and dry.  Neurological:     Mental Status: She is alert.     Motor: Weakness present.     Gait: Gait abnormal.  Psychiatric:        Mood and Affect: Mood normal.     Labs reviewed: Recent Labs    12/17/21 0533 12/18/21 1207 12/19/21 0621 01/24/22 1051 01/24/22 1157 01/31/22 1053 03/05/22 0000 08/04/22 1033 08/11/22 0000 10/16/22 0000  NA 136  --    < >  143  --  137   < > 139 142 139  K 3.5  --    < > 4.7  --  4.8   < > 4.0 4.7 4.3  CL 109  --    < > 107  --  110   < > 105 108 104  CO2 20*  --    < > 31  --  23   < > 27 27* 26*  GLUCOSE 199*  --    < > 198*  --  236*  --  181*  --   --   BUN 28*  --    < > 22  --  22   < > 17 17 16   CREATININE 0.61  --    < > 0.69  --  0.74   < > 0.72 0.5 0.5  CALCIUM 9.5  --    < > 11.8*   < > 9.7   < > 10.8* 9.2 11.1*  MG 1.5* 1.8  --   --   --   --   --   --   --   --    < > = values in this  interval not displayed.   Recent Labs    12/15/21 1011 12/17/21 0533 03/05/22 0000 06/26/22 0000 10/16/22 0000  AST 33 25 18 19 18   ALT 17 26 10 13 13   ALKPHOS 43 45 65 58 55  BILITOT 0.5 0.5  --   --   --   PROT 6.8 6.0*  --   --   --   ALBUMIN 3.3* 2.7* 4.0 3.8 4.1   Recent Labs    12/17/21 0533 01/24/22 1051 03/05/22 0000 06/26/22 0000 07/10/22 0000 08/04/22 1033 12/01/22 0000  WBC 8.6 9.3   < > 8.5 9.2 8.9 7.8  NEUTROABS  --  5.9   < > 5,347.00  --  6.3 4,360.00  HGB 10.9* 12.2   < > 13.8 14.1 13.4 13.6  HCT 36.4 41.0   < > 43 44 43.0 41  MCV 81.3 86.0  --   --   --  88.5  --   PLT 125* 218   < > 173 165 229 197   < > = values in this interval not displayed.   Lab Results  Component Value Date   TSH 0.03 (A) 08/14/2022   Lab Results  Component Value Date   HGBA1C 7.1 09/03/2022   Lab Results  Component Value Date   CHOL 110 12/15/2021   HDL 19 (L) 12/15/2021   LDLCALC 46 12/15/2021   TRIG 223 (H) 12/15/2021   CHOLHDL 5.8 12/15/2021    Significant Diagnostic Results in last 30 days:  No results found.  Assessment/Plan 1. Type 2 diabetes mellitus with other specified complication, without long-term current use of insulin (HCC) A1c at goal for age.will continue current medications, no hypoglycemia,  Encouraged dietary compliance, routine foot care/monitoring and to keep up with diabetic eye exams through ophthalmology   2. Paroxysmal atrial fibrillation (HCC) HR slightly more elevated over time, will increase metoprolol to 50 mg BID and montior  3. Primary hypertension -not at goal. Will increase metoprolol tartrate to 50 mg BID  4. Phantom pain -stable on gabapentin  5. Gastroesophageal reflux disease without esophagitis Continues on omeprazole 40 mg daily, however with more sore throat and cough, will change to protonix to see if this improves symptoms.   6. Osteoarthritis of right knee, unspecified osteoarthritis type Ongoing  pain to right  knee, does not want to follow back up with orthopedics at this time  7. Low TSH level Follow up free t3, free t4 and TSH. Endocrinology referral placed   8. Thyroid nodule Will follow up US of thyroid     K. Biagio Borg [facility] [phone]

## 2022-12-11 ENCOUNTER — Non-Acute Institutional Stay (INDEPENDENT_AMBULATORY_CARE_PROVIDER_SITE_OTHER): Payer: Medicare Other | Admitting: Nurse Practitioner

## 2022-12-11 ENCOUNTER — Encounter: Payer: Self-pay | Admitting: Nurse Practitioner

## 2022-12-11 DIAGNOSIS — Z0000 Encounter for general adult medical examination without abnormal findings: Secondary | ICD-10-CM

## 2022-12-11 LAB — TSH: TSH: 0.02 — AB (ref 0.41–5.90)

## 2022-12-11 NOTE — Progress Notes (Signed)
Subjective:   Julie Acosta is a 89 y.o. female who presents for Medicare Annual (Subsequent) preventive examination.  Visit Complete: In person [facility]   Review of Systems     Cardiac Risk Factors include: advanced age (>87men, >73 women);sedentary lifestyle;hypertension;diabetes mellitus     Objective:    Today's Vitals   12/11/22 1236  BP: (!) 140/78  Pulse: 87  Resp: 18  Temp: 98 F (36.7 C)  SpO2: 95%  Weight: 158 lb (71.7 kg)  Height: 5\' 3"  (1.6 m)   Body mass index is 27.99 kg/m.     12/11/2022   12:50 PM 12/09/2022   12:59 PM 10/22/2022   11:14 AM 10/15/2022    9:29 AM 08/21/2022    9:56 AM 08/04/2022   10:48 AM 07/24/2022    9:13 AM  Advanced Directives  Does Patient Have a Medical Advance Directive? Yes Yes Yes Yes Yes Yes Yes  Type of Advance Directive Out of facility DNR (pink MOST or yellow form) Out of facility DNR (pink MOST or yellow form) Out of facility DNR (pink MOST or yellow form) Out of facility DNR (pink MOST or yellow form) Out of facility DNR (pink MOST or yellow form)  Out of facility DNR (pink MOST or yellow form)  Does patient want to make changes to medical advance directive? No - Patient declined No - Patient declined No - Patient declined No - Patient declined No - Patient declined  No - Patient declined    Current Medications (verified) Outpatient Encounter Medications as of 12/11/2022  Medication Sig   acetaminophen (TYLENOL) 500 MG tablet Take 500 mg by mouth every 6 (six) hours as needed.   acetaminophen (TYLENOL) 650 MG CR tablet Take 650 mg by mouth every 8 (eight) hours as needed for pain. Give one tablet by mouth three times daily.   dextromethorphan-guaiFENesin (ROBITUSSIN-DM) 10-100 MG/5ML liquid Take 10 mLs by mouth every 4 (four) hours as needed for cough.   diclofenac Sodium (VOLTAREN) 1 % GEL Apply 4 g topically. Apply to stump topically every 6 hours as needed. Apply to right knee topically three times daily for pain.    gabapentin (NEURONTIN) 100 MG capsule Take 100 mg by mouth 3 (three) times daily.   GLIPIZIDE XL 2.5 MG 24 hr tablet Take 2.5 mg by mouth daily.    hydrocortisone 2.5 % cream Apply to Hemorrhoids/buttucks topically every 8 hours as needed for hemorrhoids.   hydrocortisone cream 1 % Apply to rectum topically every 6 hours as needed   Infant Care Products (DERMACLOUD) OINT Apply to Sacrum topically every shift for prevention   loratadine (CLARITIN) 10 MG tablet Take 10 mg by mouth daily.   melatonin 3 MG TABS tablet Take 3 mg by mouth at bedtime.   metFORMIN (GLUCOPHAGE) 500 MG tablet Take 750 mg by mouth 2 (two) times daily with a meal.   metoprolol tartrate (LOPRESSOR) 50 MG tablet Take 50 mg by mouth 2 (two) times daily.   Multiple Vitamin (MULTIVITAMIN WITH MINERALS) TABS tablet Take 1 tablet by mouth daily.   omeprazole (PRILOSEC) 40 MG capsule Take 40 mg by mouth daily.    ondansetron (ZOFRAN) 4 MG tablet Take 4 mg by mouth every 4 (four) hours as needed for nausea or vomiting.   OXYGEN 2lpm as needed for sats less than 88% every 1 hour as needed   PARoxetine (PAXIL) 30 MG tablet Take 30 mg by mouth daily.   Polyethyl Glycol-Propyl Glycol 0.4-0.3 % SOLN  Apply to eye 2 (two) times daily as needed.   polyethylene glycol (MIRALAX / GLYCOLAX) 17 g packet Take 17 g by mouth daily as needed.   PRADAXA 150 MG CAPS capsule TAKE 1 CAPSULE BY MOUTH TWICE DAILY START TREATMENT 10 HRS LAST DOSE OF LOVENOX   tiZANidine (ZANAFLEX) 2 MG tablet Take 2 mg by mouth every 12 (twelve) hours as needed for muscle spasms.   VITAMIN D, ERGOCALCIFEROL, PO Take one tablet ( ) by mouth once daily.   Zinc Oxide (TRIPLE PASTE) 12.8 % ointment Apply to sacrum/buttocks topically every shift.   [DISCONTINUED] metoprolol tartrate (LOPRESSOR) 25 MG tablet Take 1 tablet (25 mg total) by mouth 3 (three) times daily. (Patient taking differently: Take 25 mg by mouth 2 (two) times daily.)   No facility-administered encounter  medications on file as of 12/11/2022.    Allergies (verified) Ciprofloxacin, Codeine, Tape, Ciprocinonide [fluocinolone], Amoxicillin, Cefuroxime, Latex, and Lipitor [atorvastatin]   History: Past Medical History:  Diagnosis Date   Anxiety    Benign breast lumps    Blood clotting disorder (HCC)    Bone cancer (HCC)    head of femur, left leg ... 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  Oncotype recurrence score: 1.  DCIS, margin less than 0.5 mm.  Negative sentinel node.  Accelerated partial breast radiation.   Cervicalgia    Chronic kidney disease    kidney stones   Colon cancer [facility])    patient unaware of this   Complication of anesthesia    mood alteration / not sure if d/t pain medicine or anesthesia as she passed out    Cough    NASAL DRIP / SNEEZING / SORE THROAT MOSTLY CONSTANT   Depression    Diabetes (HCC)    Diverticulitis    Dizzy    GERD (gastroesophageal reflux disease)    H/O blood clots    arm and leg   HBP (high blood pressure)    Hematuria    gross   Hemorrhoids    HLD (hyperlipidemia)    Microscopic hematuria 06/02/2015   Muscle pain    Osteoarthritis    Osteoporosis    Panic disorder    Personal history of radiation therapy    PND (post-nasal drip)    CHRONIC WITH SORE THROAT AND SNEEZING   Reflux    Sepsis (HCC)    Sepsis, unspecified organism (HCC) 12/15/2021   Formatting of this note might be different from the original.  Last Assessment & Plan: Formatting of this note might be different from the original. Appears to be multifactorial and secondary to healthcare associated pneumonia as well as a UTI CT angio shows asymmetric airspace disease at the left base and posteriorly in the right upper lobe concerning for pneumonia Patient also has pyuria Patient   Severe sepsis (HCC) 12/15/2021   Skin cancer    nose    Swelling    Tremor    Tremors of nervous system    Ureteral stone with hydronephrosis 11/26/2018   Past Surgical History:  Procedure Laterality Date   APPENDECTOMY  1962   BREAST BIOPSY Right 2006?   benign   BREAST BIOPSY Left 2019   invasive mammary carcinoma   BREAST CYST EXCISION Left 07/27/2017   Procedure: SKIN CYST EXCISED;  Surgeon: Earline Mayotte, MD;  Location: [facility] ORS;  Service: General;  Laterality: Left;   BREAST LUMPECTOMY Left 07/2017   invasive mammary, DCIS  mammosite   BREAST SURGERY Right 2019   CARPAL TUNNEL RELEASE Right    CATARACT EXTRACTION W/PHACO Left 11/11/2016   Procedure: CATARACT EXTRACTION PHACO AND INTRAOCULAR LENS PLACEMENT (IOC);  Surgeon: Galen Manila, MD;  Location: [facility] ORS;  Service: Ophthalmology;  Laterality: Left;  [place] 01:07.9 AP% 19.2 CDE 13.02 Fluid pack lot # [id number]   CATARACT EXTRACTION W/PHACO Right 12/09/2016   Procedure: CATARACT EXTRACTION PHACO AND INTRAOCULAR LENS PLACEMENT (IOC);  Surgeon: Galen Manila, MD;  Location: [facility] ORS;  Service: Ophthalmology;  Laterality: Right;  [place] 00:49 AP% 21.2 CDE 10.39 Fluid pack lot # [id number]   CHOLECYSTECTOMY     COLONOSCOPY WITH PROPOFOL N/A 11/22/2014   Procedure: COLONOSCOPY WITH PROPOFOL;  Surgeon: Scot Jun, MD;  Location: [facility] ENDOSCOPY;  Service: Endoscopy;  Laterality: N/A;   CYSTOSCOPY WITH STENT PLACEMENT Bilateral 11/27/2018   Procedure: CYSTOSCOPY WITH STENT PLACEMENT;  Surgeon: Jerilee Field, MD;  Location: [facility] ORS;  Service: Urology;  Laterality: Bilateral;   CYSTOSCOPY/URETEROSCOPY/HOLMIUM LASER/STENT PLACEMENT Bilateral 12/17/2018   Procedure: CYSTOSCOPY/URETEROSCOPY/HOLMIUM LASER/STENT Exchange;  Surgeon: Sondra Come, MD;  Location: [facility] ORS;  Service: Urology;  Laterality: Bilateral;   ESOPHAGOGASTRODUODENOSCOPY  11/22/2014   Procedure: ESOPHAGOGASTRODUODENOSCOPY (EGD);  Surgeon: Scot Jun, MD;  Location: [facility] ENDOSCOPY;  Service: Endoscopy;;    ESOPHAGOGASTRODUODENOSCOPY (EGD) WITH PROPOFOL N/A 03/05/2017   Procedure: ESOPHAGOGASTRODUODENOSCOPY (EGD) WITH PROPOFOL;  Surgeon: Toney Reil, MD;  Location: [facility] END[facility]COPY;  Service: Gastroenterology;  Laterality: N/A;   EXTRACORPOREAL SHOCK WAVE LITHOTRIPSY Right 03/04/2018   Procedure: EXTRACORPOREAL SHOCK WAVE LITHOTRIPSY (ESWL);  Surgeon: Sondra Come, MD;  Location: [facility] ORS;  Service: Urology;  Laterality: Right;   EYE SURGERY Bilateral 2012   cataract extraction with iol   GALLBLADDER SURGERY  1968   hemi pelvectomy     left side at age 77   HEMIPELVIC Left 1962   LEG SURGERY Left    AMPUTATION d/t cancer at 89 years old   MASTECTOMY, PARTIAL Left 07/27/2017   Procedure: MASTECTOMY PARTIAL;  Surgeon: Earline Mayotte, MD;  Location: [facility] ORS;  Service: General;  Laterality: Left;   MOUTH SURGERY  2019   teeth pulled with a bridge insertion.   fell out 12/16/18   OVARY SURGERY Right    cyst removed   SAVORY DILATION  11/22/2014   Procedure: SAVORY DILATION;  Surgeon: Scot Jun, MD;  Location: [facility] ENDOSCOPY;  Service: Endoscopy;;   SENTINEL NODE BIOPSY Left 07/27/2017   Procedure: SENTINEL NODE BIOPSY;  Surgeon: Earline Mayotte, MD;  Location: [facility] ORS;  Service: General;  Laterality: Left;   SKIN CANCER EXCISION     nose and arm and face   TEE WITHOUT CARDIOVERSION N/A 07/25/2019   Procedure: TRANSESOPHAGEAL ECHOCARDIOGRAM (TEE);  Surgeon: Lamar Blinks, MD;  Location: [facility] ORS;  Service: Cardiovascular;  Laterality: N/A;   TONSILLECTOMY     Family History  Problem Relation Age of Onset   Breast cancer Paternal Aunt    Ovarian cancer Sister    Prostate cancer Father    Stroke Mother    Kidney cancer Neg Hx    Bladder Cancer Neg Hx    Social History   Socioeconomic History   Marital status: Divorced    Spouse name: Not on file   Number of children: Not on file   Years of education: Not on file   Highest education level: Not on file  Occupational History   Occupation: Runner, broadcasting/film/video    Comment: retired  Tobacco Use   Smoking status: Never   Smokeless tobacco: Never  Vaping Use   Vaping status: Never Used  Substance and Sexual Activity   Alcohol use: No   Drug use: No   Sexual activity: Not Currently  Other Topics Concern   Not on file  Social History Narrative   Patient lives at [facility]   Social Determinants of Health   Financial Resource Strain: Not on file  Food Insecurity: Not on file  Transportation Needs: Not on file  Physical Activity: Not on file  Stress: Not on file  Social Connections: Not on file    Tobacco Counseling Counseling given: Not Answered   Clinical Intake:  Pre-visit preparation completed: Yes  Pain : No/denies pain     BMI - recorded: 28 Nutritional Risks: None Diabetes: Yes  How often do you need to have someone help you when you read instructions, pamphlets, or other written materials from your doctor or pharmacy?: 4 - Often         Activities of Daily Living    12/11/2022    1:30 PM 12/15/2021    4:17 PM  In your present state of health, do you have any difficulty performing the following activities:  Hearing? 0 0  Vision? 0 0  Difficulty concentrating or making decisions? 1 1  Walking or climbing stairs? 1 1  Dressing or bathing? 1 1  Doing errands, shopping? 1 1  Preparing Food and eating ? Y   Using the Toilet? Y   In the past six months, have you accidently leaked urine? Y   Do you have problems with loss of bowel control? N   Managing your Medications? Y   Managing your Finances? Y   Housekeeping or managing your Housekeeping? Y     Patient Care Team: Earnestine Mealing, MD as PCP - General (Family Medicine) Lemar Livings Merrily Pew, MD (General Surgery) Jeralyn Ruths, MD as Consulting Physician (Oncology)  Indicate any recent Medical Services you may have received from other than [facility] providers in the past year (date may  be approximate).     Assessment:   This is a routine wellness examination for Nonda.  Hearing/Vision screen No results found.  Dietary issues and exercise activities discussed:     Goals Addressed   None    Depression Screen    10/15/2017    1:35 PM  PHQ 2/9 Scores  PHQ - 2 Score 0    Fall Risk    06/30/2019   10:31 AM 06/03/2018   10:27 AM 06/03/2018   10:26 AM 10/15/2017    1:35 PM  Fall Risk   Falls in the past year? 0 0 0 No  Number falls in past yr: 0     Injury with Fall? 0     Risk for fall due to :  Impaired balance/gait    Follow up  Falls evaluation completed;Falls prevention discussed      MEDICARE RISK AT HOME:   TIMED UP AND GO:  Was the test performed?  No    Cognitive Function:        Immunizations Immunization History  Administered Date(s) Administered   Covid-19, Mrna,Vaccine(Spikevax)2yrs and older 08/12/2022   Influenza-Unspecified 02/03/2020, 02/18/2021, 02/18/2022   Pneumococcal Conjugate-13 09/28/2017   Unspecified SARS-COV-2 Vaccination 05/20/2019, 06/17/2019, 03/16/2020, 09/20/2020, 01/25/2021    TDAP status: Due, Education has been provided regarding the importance of  this vaccine. Advised may receive this vaccine at local pharmacy or Health Dept. Aware to provide a copy of the vaccination record if obtained from local pharmacy or Health Dept. Verbalized acceptance and understanding.  Flu Vaccine status: Up to date  Pneumococcal vaccine status: Due, Education has been provided regarding the importance of this vaccine. Advised may receive this vaccine at local pharmacy or Health Dept. Aware to provide a copy of the vaccination record if obtained from local pharmacy or Health Dept. Verbalized acceptance and understanding.  Covid-19 vaccine status: Information provided on how to obtain vaccines.   Qualifies for Shingles Vaccine? Yes   Zostavax completed No   Shingrix Completed?: No.    Education has been provided regarding the  importance of this vaccine. Patient has been advised to call insurance company to determine out of pocket expense if they have not yet received this vaccine. Advised may also receive vaccine at local pharmacy or Health Dept. Verbalized acceptance and understanding.  Screening Tests Health Maintenance  Topic Date Due   DTaP/Tdap/Td (1 - Tdap) Never done   INFLUENZA VACCINE  12/04/2022   OPHTHALMOLOGY EXAM  07/31/2023 (Originally 04/29/1944)   Pneumonia Vaccine 52+ Years old (2 of 2 - PPSV23 or PCV20) 08/03/2026 (Originally 09/29/2018)   Zoster Vaccines- Shingrix (1 of 2) 11/02/2026 (Originally 04/30/1953)   HEMOGLOBIN A1C  03/06/2023   FOOT EXAM  12/11/2023   Medicare Annual Wellness (AWV)  12/11/2023   DEXA SCAN  Completed   HPV VACCINES  Aged Out   COVID-19 Vaccine  Discontinued    Health Maintenance  Health Maintenance Due  Topic Date Due   DTaP/Tdap/Td (1 - Tdap) Never done   INFLUENZA VACCINE  12/04/2022    Colorectal cancer screening: No longer required.   Mammogram status: No longer required due to age.  Unable to complete bone density due to immobility  Lung Cancer Screening: (Low Dose CT Chest recommended if Age 52-80 years, 20 pack-year currently smoking OR have quit w/in 15years.) does not qualify.   Lung Cancer Screening Referral: na  Additional Screening:  Hepatitis C Screening: does not qualify  Vision Screening: Recommended annual ophthalmology exams for early detection of glaucoma and other disorders of the eye. Is the patient up to date with their annual eye exam?  Yes  Who is the provider or what is the name of the office in which the patient attends annual eye exams? [facility]site [facility] If pt is not established with a provider, would they like to be referred to a provider to establish care? No .   Dental Screening: Recommended annual dental exams for proper oral hygiene  Diabetic Foot Exam: Diabetic Foot Exam: Completed today  Community Resource Referral /  Chronic Care Management: CRR required this visit?  No   CCM required this visit?  No     Plan:     I have personally reviewed and noted the following in the patient's chart:   Medical and social history Use of alcohol, tobacco or illicit drugs  Current medications and supplements including opioid prescriptions. Patient is not currently taking opioid prescriptions. Functional ability and status Nutritional status Physical activity Advanced directives List of other physicians Hospitalizations, surgeries, and ER visits in previous 12 months Vitals Screenings to include cognitive, depression, and falls Referrals and appointments  In addition, I have reviewed and discussed with patient certain preventive protocols, quality metrics, and best practice recommendations. A written personalized care plan for preventive services as well as general preventive health recommendations were  provided to patient.     Sharon Seller, NP   12/11/2022

## 2022-12-11 NOTE — Patient Instructions (Signed)
  Ms. Cluster , Thank you for taking time to come for your Medicare Wellness Visit. I appreciate your ongoing commitment to your health goals. Please review the following plan we discussed and let me know if I can assist you in the future.      This is a list of the screening recommended for you and due dates:  Health Maintenance  Topic Date Due   DTaP/Tdap/Td vaccine (1 - Tdap) Never done   Complete foot exam   09/11/2022   Flu Shot  12/04/2022   Eye exam for diabetics  07/31/2023*   Medicare Annual Wellness Visit  09/04/2023*   Pneumonia Vaccine (2 of 2 - PPSV23 or PCV20) 08/03/2026*   Zoster (Shingles) Vaccine (1 of 2) 11/02/2026*   Hemoglobin A1C  03/06/2023   DEXA scan (bone density measurement)  Completed   HPV Vaccine  Aged Out   COVID-19 Vaccine  Discontinued  *Topic was postponed. The date shown is not the original due date.

## 2022-12-12 ENCOUNTER — Encounter: Payer: Self-pay | Admitting: Adult Health

## 2022-12-12 ENCOUNTER — Non-Acute Institutional Stay (SKILLED_NURSING_FACILITY): Payer: Medicare Other | Admitting: Adult Health

## 2022-12-12 DIAGNOSIS — D6851 Activated protein C resistance: Secondary | ICD-10-CM

## 2022-12-12 DIAGNOSIS — I480 Paroxysmal atrial fibrillation: Secondary | ICD-10-CM | POA: Diagnosis not present

## 2022-12-12 DIAGNOSIS — R7989 Other specified abnormal findings of blood chemistry: Secondary | ICD-10-CM | POA: Diagnosis not present

## 2022-12-12 NOTE — Progress Notes (Signed)
Location:  Other Nursing Home Room Number: 215 A Place of Service:  SNF (31) Provider:  Kenard Gower, DNP, FNP-BC  Patient Care Team: Earnestine Mealing, MD as PCP - General (Family Medicine) Lemar Livings, Merrily Pew, MD (General Surgery) Jeralyn Ruths, MD as Consulting Physician (Oncology)  Extended Emergency Contact Information Primary Emergency Contact: [name] Address: [address]          [place], [place] [address] [place] of [place] Home Phone: [phone] Mobile Phone: [phone] Relation: Daughter  Code Status:  DNR  Goals of care: Advanced Directive information    12/12/2022   11:46 AM  Advanced Directives  Does Patient Have a Medical Advance Directive? Yes  Type of Advance Directive Out of facility DNR (pink MOST or yellow form)  Does patient want to make changes to medical advance directive? No - Patient declined  Pre-existing out of facility DNR order (yellow form or pink MOST form) Yellow form placed in chart (order not valid for inpatient use)     Chief Complaint  Patient presents with   Acute Visit    Low TSH    HPI:  Pt is a 89 y.o. female seen today for medical management of chronic diseases.  ***   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  Oncotype recurrence score: 1.  DCIS, margin less than 0.5 mm.  Negative sentinel node.  Accelerated partial breast radiation.   Cervicalgia    Chronic kidney disease    kidney stones   Colon cancer [facility])    patient unaware of this   Complication of anesthesia    mood alteration / not sure if d/t pain medicine or anesthesia as she passed out    Cough    NASAL DRIP / SNEEZING / SORE THROAT MOSTLY  CONSTANT   Depression    Diabetes (HCC)    Diverticulitis    Dizzy    GERD (gastroesophageal reflux disease)    H/O blood clots    arm and leg   HBP (high blood pressure)    Hematuria    gross   Hemorrhoids    HLD (hyperlipidemia)    Microscopic hematuria 06/02/2015   Muscle pain    Osteoarthritis    Osteoporosis    Panic disorder    Personal history of radiation therapy    PND (post-nasal drip)    CHRONIC WITH SORE THROAT AND SNEEZING   Reflux    Sepsis (HCC)    Sepsis, unspecified organism (HCC) 12/15/2021   Formatting of this note might be different from the original.  Last Assessment & Plan: Formatting of this note might be different from the original. Appears to be multifactorial and secondary to healthcare associated pneumonia as well as a UTI CT angio shows asymmetric airspace disease at the left base and posteriorly in the right upper lobe concerning for pneumonia Patient also has pyuria Patient   Severe sepsis (HCC) 12/15/2021   Skin cancer    nose   Swelling    Tremor    Tremors of nervous system    Ureteral stone with hydronephrosis 11/26/2018   Past Surgical History:  Procedure Laterality Date  APPENDECTOMY  1962   BREAST BIOPSY Right 2006?   benign   BREAST BIOPSY Left 2019   invasive mammary carcinoma   BREAST CYST EXCISION Left 07/27/2017   Procedure: SKIN CYST EXCISED;  Surgeon: Earline Mayotte, MD;  Location: [facility] ORS;  Service: General;  Laterality: Left;   BREAST LUMPECTOMY Left 07/2017   invasive mammary, DCIS  mammosite   BREAST SURGERY Right 2019   CARPAL TUNNEL RELEASE Right    CATARACT EXTRACTION W/PHACO Left 11/11/2016   Procedure: CATARACT EXTRACTION PHACO AND INTRAOCULAR LENS PLACEMENT (IOC);  Surgeon: Galen Manila, MD;  Location: [facility] ORS;  Service: Ophthalmology;  Laterality: Left;  [place] 01:07.9 AP% 19.2 CDE 13.02 Fluid pack lot # [id number]   CATARACT EXTRACTION W/PHACO Right 12/09/2016   Procedure: CATARACT EXTRACTION PHACO AND  INTRAOCULAR LENS PLACEMENT (IOC);  Surgeon: Galen Manila, MD;  Location: [facility] ORS;  Service: Ophthalmology;  Laterality: Right;  [place] 00:49 AP% 21.2 CDE 10.39 Fluid pack lot # [id number]   CHOLECYSTECTOMY     COLONOSCOPY WITH PROPOFOL N/A 11/22/2014   Procedure: COLONOSCOPY WITH PROPOFOL;  Surgeon: Scot Jun, MD;  Location: [facility] ENDOSCOPY;  Service: Endoscopy;  Laterality: N/A;   CYSTOSCOPY WITH STENT PLACEMENT Bilateral 11/27/2018   Procedure: CYSTOSCOPY WITH STENT PLACEMENT;  Surgeon: Jerilee Field, MD;  Location: [facility] ORS;  Service: Urology;  Laterality: Bilateral;   CYSTOSCOPY/URETEROSCOPY/HOLMIUM LASER/STENT PLACEMENT Bilateral 12/17/2018   Procedure: CYSTOSCOPY/URETEROSCOPY/HOLMIUM LASER/STENT Exchange;  Surgeon: Sondra Come, MD;  Location: [facility] ORS;  Service: Urology;  Laterality: Bilateral;   ESOPHAGOGASTRODUODENOSCOPY  11/22/2014   Procedure: ESOPHAGOGASTRODUODENOSCOPY (EGD);  Surgeon: Scot Jun, MD;  Location: [facility] ENDOSCOPY;  Service: Endoscopy;;   ESOPHAGOGASTRODUODENOSCOPY (EGD) WITH PROPOFOL N/A 03/05/2017   Procedure: ESOPHAGOGASTRODUODENOSCOPY (EGD) WITH PROPOFOL;  Surgeon: Toney Reil, MD;  Location: [facility];  Service: Gastroenterology;  Laterality: N/A;   EXTRACORPOREAL SHOCK WAVE LITHOTRIPSY Right 03/04/2018   Procedure: EXTRACORPOREAL SHOCK WAVE LITHOTRIPSY (ESWL);  Surgeon: Sondra Come, MD;  Location: [facility] ORS;  Service: Urology;  Laterality: Right;   EYE SURGERY Bilateral 2012   cataract extraction with iol   GALLBLADDER SURGERY  1968   hemi pelvectomy     left side at age 80   HEMIPELVIC Left 1962   LEG SURGERY Left    AMPUTATION d/t cancer at 89 years old   MASTECTOMY, PARTIAL Left 07/27/2017   Procedure: MASTECTOMY PARTIAL;  Surgeon: Earline Mayotte, MD;  Location: [facility] ORS;  Service: General;  Laterality: Left;   MOUTH SURGERY  2019   teeth pulled with a bridge insertion.   fell out 12/16/18   OVARY SURGERY Right    cyst  removed   SAVORY DILATION  11/22/2014   Procedure: SAVORY DILATION;  Surgeon: Scot Jun, MD;  Location: [facility] ENDOSCOPY;  Service: Endoscopy;;   SENTINEL NODE BIOPSY Left 07/27/2017   Procedure: SENTINEL NODE BIOPSY;  Surgeon: Earline Mayotte, MD;  Location: [facility] ORS;  Service: General;  Laterality: Left;   SKIN CANCER EXCISION     nose and arm and face   TEE WITHOUT CARDIOVERSION N/A 07/25/2019   Procedure: TRANSESOPHAGEAL ECHOCARDIOGRAM (TEE);  Surgeon: Lamar Blinks, MD;  Location: [facility] ORS;  Service: Cardiovascular;  Laterality: N/A;   TONSILLECTOMY      Allergies  Allergen Reactions   Ciprofloxacin Hives   Codeine Other (See Comments)    HALLUCINATIONS   Tape     Rash and skin irritation/ paper tape and tegaderm OK   Ciprocinonide [Fluocinolone] Other (See  Comments)    unknown   Amoxicillin Other (See Comments)    Upset stomach Has patient had a PCN reaction causing immediate rash, facial/tongue/throat swelling, SOB or lightheadedness with hypotension: No Has patient had a PCN reaction causing severe rash involving mucus membranes or skin necrosis: No Has patient had a PCN reaction that required hospitalization: No Has patient had a PCN reaction occurring within the last 10 years: Yes If all of the above answers are "NO", then may proceed with Cephalosporin use.;   Cefuroxime Other (See Comments)    OK INTRACAMERALLY PER DR WLP, upset stomach   Latex Rash    Rast test NEGATIVE   Lipitor [Atorvastatin] Other (See Comments)    unknown    Outpatient Encounter Medications as of 12/12/2022  Medication Sig   acetaminophen (TYLENOL) 500 MG tablet Take 500 mg by mouth every 6 (six) hours as needed.   acetaminophen (TYLENOL) 650 MG CR tablet Take 650 mg by mouth every 8 (eight) hours as needed for pain. Give one tablet by mouth three times daily.   dextromethorphan-guaiFENesin (ROBITUSSIN-DM) 10-100 MG/5ML liquid Take 10 mLs by mouth every 4 (four) hours as needed for  cough.   diclofenac Sodium (VOLTAREN) 1 % GEL Apply 4 g topically. Apply to stump topically every 6 hours as needed. Apply to right knee topically three times daily for pain.   gabapentin (NEURONTIN) 100 MG capsule Take 100 mg by mouth 3 (three) times daily.   GLIPIZIDE XL 2.5 MG 24 hr tablet Take 2.5 mg by mouth daily.    hydrocortisone 2.5 % cream Apply to Hemorrhoids/buttucks topically every 8 hours as needed for hemorrhoids.   hydrocortisone cream 1 % Apply to rectum topically every 6 hours as needed   loratadine (CLARITIN) 10 MG tablet Take 10 mg by mouth daily.   melatonin 3 MG TABS tablet Take 3 mg by mouth at bedtime.   metFORMIN (GLUCOPHAGE) 500 MG tablet Take 750 mg by mouth 2 (two) times daily with a meal.   metoprolol tartrate (LOPRESSOR) 50 MG tablet Take 50 mg by mouth 2 (two) times daily.   ondansetron (ZOFRAN) 4 MG tablet Take 4 mg by mouth every 4 (four) hours as needed for nausea or vomiting.   OXYGEN 2lpm as needed for sats less than 88% every 1 hour as needed   pantoprazole (PROTONIX) 40 MG tablet Take 40 mg by mouth daily.   PARoxetine (PAXIL) 30 MG tablet Take 30 mg by mouth daily.   Polyethyl Glycol-Propyl Glycol 0.4-0.3 % SOLN Apply to eye 2 (two) times daily as needed.   polyethylene glycol (MIRALAX / GLYCOLAX) 17 g packet Take 17 g by mouth daily as needed.   PRADAXA 150 MG CAPS capsule TAKE 1 CAPSULE BY MOUTH TWICE DAILY START TREATMENT 10 HRS LAST DOSE OF LOVENOX   SUNSCREEN SPF50 EX Apply topically. Apply to exposed skin topically every 2 hours as needed for Sunburn Prevention   tiZANidine (ZANAFLEX) 2 MG tablet Take 2 mg by mouth every 12 (twelve) hours as needed for muscle spasms.   VITAMIN D, ERGOCALCIFEROL, PO Take one tablet ( ) by mouth once daily.   Zinc Oxide (TRIPLE PASTE) 12.8 % ointment Apply to sacrum/buttocks topically every shift.   [DISCONTINUED] Infant Care Products (DERMACLOUD) OINT Apply to Sacrum topically every shift for prevention    [DISCONTINUED] Multiple Vitamin (MULTIVITAMIN WITH MINERALS) TABS tablet Take 1 tablet by mouth daily.   [DISCONTINUED] omeprazole (PRILOSEC) 40 MG capsule Take 40 mg by mouth daily.  No facility-administered encounter medications on file as of 12/12/2022.    Review of Systems ***    Immunization History  Administered Date(s) Administered   Covid-19, Mrna,Vaccine(Spikevax)25yrs and older 08/12/2022   Influenza-Unspecified 02/03/2020, 02/18/2021, 02/18/2022   Pneumococcal Conjugate-13 09/28/2017   Unspecified SARS-COV-2 Vaccination 05/20/2019, 06/17/2019, 03/16/2020, 09/20/2020, 01/25/2021   Pertinent  Health Maintenance Due  Topic Date Due   INFLUENZA VACCINE  12/04/2022   OPHTHALMOLOGY EXAM  07/31/2023 (Originally 04/29/1944)   HEMOGLOBIN A1C  03/06/2023   FOOT EXAM  12/11/2023   DEXA SCAN  Completed      12/18/2021    7:45 PM 12/19/2021    7:00 AM 12/19/2021   12:00 PM 01/24/2022   11:20 AM 12/11/2022    1:31 PM  Fall Risk  Falls in the past year?     0  (RETIRED) Patient Fall Risk Level High fall risk High fall risk High fall risk High fall risk      Vitals:   12/12/22 1145  BP: (!) 140/78  Pulse: 87  Resp: 18  Temp: 98 F (36.7 C)  SpO2: 90%  Weight: 158 lb (71.7 kg)  Height: 5\' 3"  (1.6 m)   Body mass index is 27.99 kg/m.  Physical Exam     Labs reviewed: Recent Labs    12/17/21 0533 12/18/21 1207 12/19/21 0621 01/24/22 1051 01/24/22 1157 01/31/22 1053 03/05/22 0000 08/04/22 1033 08/11/22 0000 10/16/22 0000  NA 136  --    < > 143  --  137   < > 139 142 139  K 3.5  --    < > 4.7  --  4.8   < > 4.0 4.7 4.3  CL 109  --    < > 107  --  110   < > 105 108 104  CO2 20*  --    < > 31  --  23   < > 27 27* 26*  GLUCOSE 199*  --    < > 198*  --  236*  --  181*  --   --   BUN 28*  --    < > 22  --  22   < > 17 17 16   CREATININE 0.61  --    < > 0.69  --  0.74   < > 0.72 0.5 0.5  CALCIUM 9.5  --    < > 11.8*   < > 9.7   < > 10.8* 9.2 11.1*  MG 1.5* 1.8  --    --   --   --   --   --   --   --    < > = values in this interval not displayed.   Recent Labs    12/15/21 1011 12/17/21 0533 03/05/22 0000 06/26/22 0000 10/16/22 0000  AST 33 25 18 19 18   ALT 17 26 10 13 13   ALKPHOS 43 45 65 58 55  BILITOT 0.5 0.5  --   --   --   PROT 6.8 6.0*  --   --   --   ALBUMIN 3.3* 2.7* 4.0 3.8 4.1   Recent Labs    12/17/21 0533 01/24/22 1051 03/05/22 0000 06/26/22 0000 07/10/22 0000 08/04/22 1033 12/01/22 0000  WBC 8.6 9.3   < > 8.5 9.2 8.9 7.8  NEUTROABS  --  5.9   < > 5,347.00  --  6.3 4,360.00  HGB 10.9* 12.2   < > 13.8 14.1 13.4 13.6  HCT 36.4 41.0   < >  43 44 43.0 41  MCV 81.3 86.0  --   --   --  88.5  --   PLT 125* 218   < > 173 165 229 197   < > = values in this interval not displayed.   Lab Results  Component Value Date   TSH 0.03 (A) 08/14/2022   Lab Results  Component Value Date   HGBA1C 7.1 09/03/2022   Lab Results  Component Value Date   CHOL 110 12/15/2021   HDL 19 (L) 12/15/2021   LDLCALC 46 12/15/2021   TRIG 223 (H) 12/15/2021   CHOLHDL 5.8 12/15/2021    Significant Diagnostic Results in last 30 days:  No results found.  Assessment/Plan ***   Family/ staff Communication: Discussed plan of care with resident and charge nurse  Labs/tests ordered:     Kenard Gower, DNP, MSN, FNP-BC [facility] [phone] (Monday-Friday 8:00 a.m. - 5:00 p.m.) [phone] (after hours)

## 2022-12-19 ENCOUNTER — Telehealth: Payer: Medicare Other | Admitting: Student

## 2022-12-19 NOTE — Telephone Encounter (Signed)
Called radiology for update on ultrasound of tyroid. Multiple nodules present.   The right lobe of the thyroid gland measures 2.2x3.9x2.9 cm. There is an inhomogeneous echotexture. A cyst is seen in the right thyroid lobe with no dimension mentioned. LEFT LOBE: The left lobe of the thyroid gland measures 1.2x2.5x1.5cm. There is an inhomogeneous echotexture. There are no demonstrated solid, cystic, or complex lesions.    Smaller than previous nodules on 12/19/2021. Solid nodule in the right inferior thyroid (labeled 1, 3.0 cm) meets criteria (TI-RADS category 4) for tissue sampling.   Will discuss with family and patient if they would like further work up with FNA.   Coralyn Helling, MD, [facility] [phone]

## 2023-01-09 ENCOUNTER — Other Ambulatory Visit: Payer: Self-pay | Admitting: Student

## 2023-01-09 DIAGNOSIS — S88912S Complete traumatic amputation of left lower leg, level unspecified, sequela: Secondary | ICD-10-CM

## 2023-01-09 NOTE — Progress Notes (Signed)
Patient s/p amputation in her 68s. Needs evaluation at [facility] in [facility]

## 2023-01-15 ENCOUNTER — Other Ambulatory Visit (INDEPENDENT_AMBULATORY_CARE_PROVIDER_SITE_OTHER): Payer: Self-pay | Admitting: Nurse Practitioner

## 2023-01-15 DIAGNOSIS — Z89612 Acquired absence of left leg above knee: Secondary | ICD-10-CM

## 2023-01-15 DIAGNOSIS — I739 Peripheral vascular disease, unspecified: Secondary | ICD-10-CM

## 2023-01-22 ENCOUNTER — Ambulatory Visit (INDEPENDENT_AMBULATORY_CARE_PROVIDER_SITE_OTHER): Payer: Medicare Other | Admitting: Vascular Surgery

## 2023-01-22 ENCOUNTER — Ambulatory Visit (INDEPENDENT_AMBULATORY_CARE_PROVIDER_SITE_OTHER): Payer: Medicare Other

## 2023-01-22 DIAGNOSIS — I82509 Chronic embolism and thrombosis of unspecified deep veins of unspecified lower extremity: Secondary | ICD-10-CM | POA: Diagnosis not present

## 2023-01-22 DIAGNOSIS — I10 Essential (primary) hypertension: Secondary | ICD-10-CM

## 2023-01-22 DIAGNOSIS — Z89612 Acquired absence of left leg above knee: Secondary | ICD-10-CM | POA: Diagnosis not present

## 2023-01-22 DIAGNOSIS — I739 Peripheral vascular disease, unspecified: Secondary | ICD-10-CM

## 2023-01-22 DIAGNOSIS — I21A1 Myocardial infarction type 2: Secondary | ICD-10-CM | POA: Diagnosis not present

## 2023-01-22 DIAGNOSIS — E1169 Type 2 diabetes mellitus with other specified complication: Secondary | ICD-10-CM

## 2023-01-22 NOTE — Progress Notes (Signed)
MRN : [id number]  Julie Acosta is a 89 y.o. (26-Dec-1933) female who presents with chief complaint of check circulation.  History of Present Illness:   The patient presents to the office for follow up evaluation of DVT.  DVT was identified at [facility] remotely.  The initial symptoms were pain and swelling in the lower extremity.   The patient notes her right leg has been less painful with dependency and swells some but is well controled with a Sigvaris compression wrap.  Symptoms are much better with elevation.  The patient notes minimal edema in the morning which steadily worsens throughout the day if not wearing compression.     The patient has been using compression therapy at this point.   No SOB or pleuritic chest pains.  No cough or hemoptysis.   In the past she developed left flank pain and was found to have multiple splenic infarcts while taking Xarelto so she has been changed to Pradaxa.   ABI's Rt=1.16 and Lt=AKA  (previous ABI's Rt=1.12 and the Lt=AKA)  No outpatient medications have been marked as taking for the 01/22/23 encounter (Appointment) with Gilda Crease, Latina Craver, MD.    Past Medical History:  Diagnosis Date   Anxiety    Benign breast lumps    Blood clotting disorder (HCC)    Bone cancer (HCC)    head of femur, left leg ... amputation at age 25   Breast cancer of upper-outer quadrant of left female breast (HCC) 07/27/2017   11 mm invasive mammary carcinoma, ER 90%, PR 51-90%, negative margins.  Oncotype recurrence score: 1.  DCIS, margin less than 0.5 mm.  Negative sentinel node.  Accelerated partial breast radiation.   Cervicalgia    Chronic kidney disease    kidney stones   Colon cancer [facility])    patient unaware of this   Complication of anesthesia    mood alteration / not sure if d/t pain medicine or anesthesia as she passed out    Cough    NASAL DRIP / SNEEZING / SORE THROAT MOSTLY  CONSTANT   Depression    Diabetes (HCC)    Diverticulitis    Dizzy    GERD (gastroesophageal reflux disease)    H/O blood clots    arm and leg   HBP (high blood pressure)    Hematuria    gross   Hemorrhoids    HLD (hyperlipidemia)    Microscopic hematuria 06/02/2015   Muscle pain    Osteoarthritis    Osteoporosis    Panic disorder    Personal history of radiation therapy    PND (post-nasal drip)    CHRONIC WITH SORE THROAT AND SNEEZING   Reflux    Sepsis (HCC)    Sepsis, unspecified organism (HCC) 12/15/2021   Formatting of this note might be different from the original.  Last Assessment & Plan: Formatting of this note might be different from the original. Appears to be multifactorial and secondary to healthcare associated pneumonia as well as a UTI CT angio shows asymmetric airspace disease at the left base and posteriorly in the right  upper lobe concerning for pneumonia Patient also has pyuria Patient   Severe sepsis (HCC) 12/15/2021   Skin cancer    nose   Swelling    Tremor    Tremors of nervous system    Ureteral stone with hydronephrosis 11/26/2018    Past Surgical History:  Procedure Laterality Date   APPENDECTOMY  1962   BREAST BIOPSY Right 2006?   benign   BREAST BIOPSY Left 2019   invasive mammary carcinoma   BREAST CYST EXCISION Left 07/27/2017   Procedure: SKIN CYST EXCISED;  Surgeon: Earline Mayotte, MD;  Location: [facility] ORS;  Service: General;  Laterality: Left;   BREAST LUMPECTOMY Left 07/2017   invasive mammary, DCIS  mammosite   BREAST SURGERY Right 2019   CARPAL TUNNEL RELEASE Right    CATARACT EXTRACTION W/PHACO Left 11/11/2016   Procedure: CATARACT EXTRACTION PHACO AND INTRAOCULAR LENS PLACEMENT (IOC);  Surgeon: Galen Manila, MD;  Location: [facility] ORS;  Service: Ophthalmology;  Laterality: Left;  [place] 01:07.9 AP% 19.2 CDE 13.02 Fluid pack lot # [id number]   CATARACT EXTRACTION W/PHACO Right 12/09/2016   Procedure: CATARACT EXTRACTION PHACO AND  INTRAOCULAR LENS PLACEMENT (IOC);  Surgeon: Galen Manila, MD;  Location: [facility] ORS;  Service: Ophthalmology;  Laterality: Right;  [place] 00:49 AP% 21.2 CDE 10.39 Fluid pack lot # [id number]   CHOLECYSTECTOMY     COLONOSCOPY WITH PROPOFOL N/A 11/22/2014   Procedure: COLONOSCOPY WITH PROPOFOL;  Surgeon: Scot Jun, MD;  Location: [facility] ENDOSCOPY;  Service: Endoscopy;  Laterality: N/A;   CYSTOSCOPY WITH STENT PLACEMENT Bilateral 11/27/2018   Procedure: CYSTOSCOPY WITH STENT PLACEMENT;  Surgeon: Jerilee Field, MD;  Location: [facility] ORS;  Service: Urology;  Laterality: Bilateral;   CYSTOSCOPY/URETEROSCOPY/HOLMIUM LASER/STENT PLACEMENT Bilateral 12/17/2018   Procedure: CYSTOSCOPY/URETEROSCOPY/HOLMIUM LASER/STENT Exchange;  Surgeon: Sondra Come, MD;  Location: [facility] ORS;  Service: Urology;  Laterality: Bilateral;   ESOPHAGOGASTRODUODENOSCOPY  11/22/2014   Procedure: ESOPHAGOGASTRODUODENOSCOPY (EGD);  Surgeon: Scot Jun, MD;  Location: [facility] ENDOSCOPY;  Service: Endoscopy;;   ESOPHAGOGASTRODUODENOSCOPY (EGD) WITH PROPOFOL N/A 03/05/2017   Procedure: ESOPHAGOGASTRODUODENOSCOPY (EGD) WITH PROPOFOL;  Surgeon: Toney Reil, MD;  Location: [facility] ENDOSCOPY;  Service: Gastroenterology;  Laterality: N/A;   EXTRACORPOREAL SHOCK WAVE LITHOTRIPSY Right 03/04/2018   Procedure: EXTRACORPOREAL SHOCK WAVE LITHOTRIPSY (ESWL);  Surgeon: Sondra Come, MD;  Location: [facility] ORS;  Service: Urology;  Laterality: Right;   EYE SURGERY Bilateral 2012   cataract extraction with iol   GALLBLADDER SURGERY  1968   hemi pelvectomy     left side at age 9   HEMIPELVIC Left 1962   LEG SURGERY Left    AMPUTATION d/t cancer at 89 years old   MASTECTOMY, PARTIAL Left 07/27/2017   Procedure: MASTECTOMY PARTIAL;  Surgeon: Earline Mayotte, MD;  Location: [facility] ORS;  Service: General;  Laterality: Left;   MOUTH SURGERY  2019   teeth pulled with a bridge insertion.   fell out 12/16/18   OVARY SURGERY Right    cyst  removed   SAVORY DILATION  11/22/2014   Procedure: SAVORY DILATION;  Surgeon: Scot Jun, MD;  Location: [facility] ENDOSCOPY;  Service: Endoscopy;;   SENTINEL NODE BIOPSY Left 07/27/2017   Procedure: SENTINEL NODE BIOPSY;  Surgeon: Earline Mayotte, MD;  Location: [facility] ORS;  Service: General;  Laterality: Left;   SKIN CANCER EXCISION     nose and arm and face   TEE WITHOUT CARDIOVERSION N/A 07/25/2019   Procedure: TRANSESOPHAGEAL ECHOCARDIOGRAM (TEE);  Surgeon: Lamar Blinks, MD;  Location: [facility] ORS;  Service: Cardiovascular;  Laterality: N/A;   TONSILLECTOMY      Social History Social History   Tobacco Use   Smoking status: Never   Smokeless tobacco: Never  Vaping Use   Vaping status: Never Used  Substance Use Topics   Alcohol use: No   Drug use: No    Family History Family History  Problem Relation Age of Onset   Breast cancer Paternal Aunt    Ovarian cancer Sister    Prostate cancer Father    Stroke Mother    Kidney cancer Neg Hx    Bladder Cancer Neg Hx     Allergies  Allergen Reactions   Ciprofloxacin Hives   Codeine Other (See Comments)    HALLUCINATIONS   Tape     Rash and skin irritation/ paper tape and tegaderm OK   Ciprocinonide [Fluocinolone] Other (See Comments)    unknown   Amoxicillin Other (See Comments)    Upset stomach Has patient had a PCN reaction causing immediate rash, facial/tongue/throat swelling, SOB or lightheadedness with hypotension: No Has patient had a PCN reaction causing severe rash involving mucus membranes or skin necrosis: No Has patient had a PCN reaction that required hospitalization: No Has patient had a PCN reaction occurring within the last 10 years: Yes If all of the above answers are "NO", then may proceed with Cephalosporin use.;   Cefuroxime Other (See Comments)    OK INTRACAMERALLY PER DR WLP, upset stomach   Latex Rash    Rast test NEGATIVE   Lipitor [Atorvastatin] Other (See Comments)    unknown     REVIEW  OF SYSTEMS (Negative unless checked)  Constitutional: [] Weight loss  [] Fever  [] Chills Cardiac: [] Chest pain   [] Chest pressure   [] Palpitations   [] Shortness of breath when laying flat   [] Shortness of breath with exertion. Vascular:  [x] Pain in legs with walking   [] Pain in legs at rest  [] History of DVT   [] Phlebitis   [] Swelling in legs   [] Varicose veins   [] Non-healing ulcers Pulmonary:   [] Uses home oxygen   [] Productive cough   [] Hemoptysis   [] Wheeze  [] COPD   [] Asthma Neurologic:  [] Dizziness   [] Seizures   [] History of stroke   [] History of TIA  [] Aphasia   [] Vissual changes   [] Weakness or numbness in arm   [] Weakness or numbness in leg Musculoskeletal:   [] Joint swelling   [] Joint pain   [] Low back pain Hematologic:  [] Easy bruising  [] Easy bleeding   [] Hypercoagulable state   [] Anemic Gastrointestinal:  [] Diarrhea   [] Vomiting  [] Gastroesophageal reflux/heartburn   [] Difficulty swallowing. Genitourinary:  [] Chronic kidney disease   [] Difficult urination  [] Frequent urination   [] Blood in urine Skin:  [] Rashes   [] Ulcers  Psychological:  [] History of anxiety   []  History of major depression.  Physical Examination  There were no vitals filed for this visit. There is no height or weight on file to calculate BMI. Gen: WD/WN, NAD Head: [place]/AT, No temporalis wasting.  Ear/Nose/Throat: Hearing grossly intact, nares w/o erythema or drainage Eyes: PER, EOMI, sclera nonicteric.  Neck: Supple, no masses.  No bruit or JVD.  Pulmonary:  Good air movement, no audible wheezing, no use of accessory muscles.  Cardiac: RRR, normal S1, S2, no Murmurs. Vascular:  mild trophic changes, no open wounds Vessel Right Left  Radial Palpable Palpable  PT Palpable AKA  DP  Palpable AKA  Gastrointestinal: soft, non-distended. No guarding/no peritoneal signs.  Musculoskeletal: M/S  5/5 throughout.  No visible deformity.  Neurologic: CN 2-12 intact. Pain and light touch intact in extremities.   Symmetrical.  Speech is fluent. Motor exam as listed above. Psychiatric: Judgment intact, Mood & affect appropriate for pt's clinical situation. Dermatologic: No rashes or ulcers noted.  No changes consistent with cellulitis.   CBC Lab Results  Component Value Date   WBC 7.8 12/01/2022   HGB 13.6 12/01/2022   HCT 41 12/01/2022   MCV 88.5 08/04/2022   PLT 197 12/01/2022    BMET    Component Value Date/Time   NA 139 10/16/2022 0000   NA 139 07/01/2013 1721   K 4.3 10/16/2022 0000   K 4.6 07/01/2013 1721   CL 104 10/16/2022 0000   CL 108 (H) 07/01/2013 1721   CO2 26 (A) 10/16/2022 0000   CO2 23 07/01/2013 1721   GLUCOSE 181 (H) 08/04/2022 1033   GLUCOSE 150 (H) 07/01/2013 1721   BUN 16 10/16/2022 0000   BUN 21 (H) 07/01/2013 1721   CREATININE 0.5 10/16/2022 0000   CREATININE 0.72 08/04/2022 1033   CREATININE 0.95 07/01/2013 1721   CALCIUM 11.1 (A) 10/16/2022 0000   CALCIUM 11.9 (H) 01/24/2022 1157   GFRNONAA >60 08/04/2022 1033   GFRNONAA 58 (L) 07/01/2013 1721   GFRAA >60 01/24/2020 1023   GFRAA >60 07/01/2013 1721   CrCl cannot be calculated (Patient's most recent lab result is older than the maximum 21 days allowed.).  COAG Lab Results  Component Value Date   INR 1.3 (H) 12/15/2021   INR 1.3 (H) 07/24/2019    Radiology No results found.   Assessment/Plan 1. Chronic deep vein thrombosis (DVT) of lower extremity, unspecified laterality, unspecified vein (HCC) Recommend:    No surgery or intervention at this point in time.  IVC filter is not indicated at present.   Patient's duplex ultrasound of the venous system shows DVT from the popliteal to the femoral veins.   The patient is initiated on anticoagulation    Elevation was stressed, use of a recliner was discussed.   The patient will wear graduated compression wraps. The patient will  beginning wearing the stockings first thing in the morning and removing them in the evening. The patient is instructed  specifically not to sleep in the stockings.  In addition, behavioral modification including elevation during the day and avoidance of prolonged dependency will be initiated.     The patient will continue anticoagulation for now as there have not been any problems or complications at this point. - VAS [place] ABI WITH/WO TBI; Future  2. Type 2 MI (myocardial infarction) (HCC) Continue cardiac and antihypertensive medications as already ordered and reviewed, no changes at this time.  Continue statin as ordered and reviewed, no changes at this time  Nitrates PRN for chest pain  3. Primary hypertension Continue antihypertensive medications as already ordered, these medications have been reviewed and there are no changes at this time.  4. Type 2 diabetes mellitus with other specified complication, without long-term current use of insulin (HCC) Continue hypoglycemic medications as already ordered, these medications have been reviewed and there are no changes at this time.  Hgb A1C to be monitored as already arranged by primary service.  5. Coagulation disorder [facility]) Patient is now on Pradaxa   She will be on lifelong anticoagulation    Levora Dredge, MD  01/22/2023 12:23 PM

## 2023-01-30 ENCOUNTER — Non-Acute Institutional Stay (SKILLED_NURSING_FACILITY): Payer: Medicare Other | Admitting: Adult Health

## 2023-01-30 DIAGNOSIS — L739 Follicular disorder, unspecified: Secondary | ICD-10-CM

## 2023-01-30 MED ORDER — DOXYCYCLINE HYCLATE 100 MG PO TABS
100.0000 mg | ORAL_TABLET | Freq: Two times a day (BID) | ORAL | Status: AC
Start: 2023-01-30 — End: 2023-02-09

## 2023-01-30 NOTE — Progress Notes (Unsigned)
[facility] clinic  Provider:   Code Status: *** Goals of Care:     12/12/2022   11:46 AM  Advanced Directives  Does Patient Have a Medical Advance Directive? Yes  Type of Advance Directive Out of facility DNR (pink MOST or yellow form)  Does patient want to make changes to medical advance directive? No - Patient declined  Pre-existing out of facility DNR order (yellow form or pink MOST form) Yellow form placed in chart (order not valid for inpatient use)     Chief Complaint  Patient presents with   Acute Visit     folliculitis on right labia    HPI: Patient is a 89 y.o. female seen today for an acute visit for  Past Medical History:  Diagnosis Date   Anxiety    Benign breast lumps    Blood clotting disorder (HCC)    Bone cancer (HCC)    head of femur, left leg ... 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  Oncotype recurrence score: 1.  DCIS, margin less than 0.5 mm.  Negative sentinel node.  Accelerated partial breast radiation.   Cervicalgia    Chronic kidney disease    kidney stones   Colon cancer [facility])    patient unaware of this   Complication of anesthesia    mood alteration / not sure if d/t pain medicine or anesthesia as she passed out    Cough    NASAL DRIP / SNEEZING / SORE THROAT MOSTLY CONSTANT   Depression    Diabetes (HCC)    Diverticulitis    Dizzy    GERD (gastroesophageal reflux disease)    H/O blood clots    arm and leg   HBP (high blood pressure)    Hematuria    gross   Hemorrhoids    HLD (hyperlipidemia)    Microscopic hematuria 06/02/2015   Muscle pain    Osteoarthritis    Osteoporosis    Panic disorder    Personal history of radiation therapy    PND (post-nasal drip)    CHRONIC WITH SORE THROAT AND SNEEZING   Reflux    Sepsis (HCC)    Sepsis, unspecified organism (HCC) 12/15/2021   Formatting of this note  might be different from the original.  Last Assessment & Plan: Formatting of this note might be different from the original. Appears to be multifactorial and secondary to healthcare associated pneumonia as well as a UTI CT angio shows asymmetric airspace disease at the left base and posteriorly in the right upper lobe concerning for pneumonia Patient also has pyuria Patient   Severe sepsis (HCC) 12/15/2021   Skin cancer    nose   Swelling    Tremor    Tremors of nervous system    Ureteral stone with hydronephrosis 11/26/2018    Past Surgical History:  Procedure Laterality Date   APPENDECTOMY  1962   BREAST BIOPSY Right 2006?   benign   BREAST BIOPSY Left 2019   invasive mammary carcinoma   BREAST CYST EXCISION Left 07/27/2017   Procedure: SKIN CYST EXCISED;  Surgeon: Earline Mayotte, MD;  Location: [facility] ORS;  Service: General;  Laterality: Left;   BREAST LUMPECTOMY Left 07/2017   invasive mammary, DCIS  mammosite   BREAST SURGERY Right 2019   CARPAL TUNNEL RELEASE Right    CATARACT EXTRACTION [facility] Left 11/11/2016  Procedure: CATARACT EXTRACTION PHACO AND INTRAOCULAR LENS PLACEMENT (IOC);  Surgeon: Galen Manila, MD;  Location: [facility] ORS;  Service: Ophthalmology;  Laterality: Left;  [place] 01:07.9 AP% 19.2 CDE 13.02 Fluid pack lot # [id number]   CATARACT EXTRACTION W/PHACO Right 12/09/2016   Procedure: CATARACT EXTRACTION PHACO AND INTRAOCULAR LENS PLACEMENT (IOC);  Surgeon: Galen Manila, MD;  Location: [facility] ORS;  Service: Ophthalmology;  Laterality: Right;  [place] 00:49 AP% 21.2 CDE 10.39 Fluid pack lot # [id number]   CHOLECYSTECTOMY     COLONOSCOPY WITH PROPOFOL N/A 11/22/2014   Procedure: COLONOSCOPY WITH PROPOFOL;  Surgeon: Scot Jun, MD;  Location: [facility] ENDOSCOPY;  Service: Endoscopy;  Laterality: N/A;   CYSTOSCOPY WITH STENT PLACEMENT Bilateral 11/27/2018   Procedure: CYSTOSCOPY WITH STENT PLACEMENT;  Surgeon: Jerilee Field, MD;  Location: [facility] ORS;  Service:  Urology;  Laterality: Bilateral;   CYSTOSCOPY/URETEROSCOPY/HOLMIUM LASER/STENT PLACEMENT Bilateral 12/17/2018   Procedure: CYSTOSCOPY/URETEROSCOPY/HOLMIUM LASER/STENT Exchange;  Surgeon: Sondra Come, MD;  Location: [facility] ORS;  Service: Urology;  Laterality: Bilateral;   ESOPHAGOGASTRODUODENOSCOPY  11/22/2014   Procedure: ESOPHAGOGASTRODUODENOSCOPY (EGD);  Surgeon: Scot Jun, MD;  Location: [facility] ENDOSCOPY;  Service: Endoscopy;;   ESOPHAGOGASTRODUODENOSCOPY (EGD) WITH PROPOFOL N/A 03/05/2017   Procedure: ESOPHAGOGASTRODUODENOSCOPY (EGD) WITH PROPOFOL;  Surgeon: Toney Reil, MD;  Location: [facility] ENDOSCOPY;  Service: Gastroenterology;  Laterality: N/A;   EXTRACORPOREAL SHOCK WAVE LITHOTRIPSY Right 03/04/2018   Procedure: EXTRACORPOREAL SHOCK WAVE LITHOTRIPSY (ESWL);  Surgeon: Sondra Come, MD;  Location: [facility] ORS;  Service: Urology;  Laterality: Right;   EYE SURGERY Bilateral 2012   cataract extraction with iol   GALLBLADDER SURGERY  1968   hemi pelvectomy     left side at age 23   HEMIPELVIC Left 1962   LEG SURGERY Left    AMPUTATION d/t cancer at 89 years old   MASTECTOMY, PARTIAL Left 07/27/2017   Procedure: MASTECTOMY PARTIAL;  Surgeon: Earline Mayotte, MD;  Location: [facility] ORS;  Service: General;  Laterality: Left;   MOUTH SURGERY  2019   teeth pulled with a bridge insertion.   fell out 12/16/18   OVARY SURGERY Right    cyst removed   SAVORY DILATION  11/22/2014   Procedure: SAVORY DILATION;  Surgeon: Scot Jun, MD;  Location: [facility] ENDOSCOPY;  Service: Endoscopy;;   SENTINEL NODE BIOPSY Left 07/27/2017   Procedure: SENTINEL NODE BIOPSY;  Surgeon: Earline Mayotte, MD;  Location: [facility] ORS;  Service: General;  Laterality: Left;   SKIN CANCER EXCISION     nose and arm and face   TEE WITHOUT CARDIOVERSION N/A 07/25/2019   Procedure: TRANSESOPHAGEAL ECHOCARDIOGRAM (TEE);  Surgeon: Lamar Blinks, MD;  Location: [facility] ORS;  Service: Cardiovascular;  Laterality:  N/A;   TONSILLECTOMY      Allergies  Allergen Reactions   Ciprofloxacin Hives   Codeine Other (See Comments)    HALLUCINATIONS   Tape     Rash and skin irritation/ paper tape and tegaderm OK   Ciprocinonide [Fluocinolone] Other (See Comments)    unknown   Amoxicillin Other (See Comments)    Upset stomach Has patient had a PCN reaction causing immediate rash, facial/tongue/throat swelling, SOB or lightheadedness with hypotension: No Has patient had a PCN reaction causing severe rash involving mucus membranes or skin necrosis: No Has patient had a PCN reaction that required hospitalization: No Has patient had a PCN reaction occurring within the last 10 years: Yes If all of the above answers are "NO", then may proceed with Cephalosporin use.;  Cefuroxime Other (See Comments)    OK INTRACAMERALLY PER DR WLP, upset stomach   Latex Rash    Rast test NEGATIVE   Lipitor [Atorvastatin] Other (See Comments)    unknown    Outpatient Encounter Medications as of 01/30/2023  Medication Sig   acetaminophen (TYLENOL) 500 MG tablet Take 500 mg by mouth every 6 (six) hours as needed.   acetaminophen (TYLENOL) 650 MG CR tablet Take 650 mg by mouth every 8 (eight) hours as needed for pain. Give one tablet by mouth three times daily.   dextromethorphan-guaiFENesin (ROBITUSSIN-DM) 10-100 MG/5ML liquid Take 10 mLs by mouth every 4 (four) hours as needed for cough.   diclofenac Sodium (VOLTAREN) 1 % GEL Apply 4 g topically. Apply to stump topically every 6 hours as needed. Apply to right knee topically three times daily for pain.   gabapentin (NEURONTIN) 100 MG capsule Take 100 mg by mouth 3 (three) times daily.   GLIPIZIDE XL 2.5 MG 24 hr tablet Take 2.5 mg by mouth daily.    hydrocortisone 2.5 % cream Apply to Hemorrhoids/buttucks topically every 8 hours as needed for hemorrhoids.   hydrocortisone cream 1 % Apply to rectum topically every 6 hours as needed   loratadine (CLARITIN) 10 MG tablet Take 10  mg by mouth daily.   melatonin 3 MG TABS tablet Take 3 mg by mouth at bedtime.   metFORMIN (GLUCOPHAGE) 500 MG tablet Take 750 mg by mouth 2 (two) times daily with a meal.   metoprolol tartrate (LOPRESSOR) 50 MG tablet Take 50 mg by mouth 2 (two) times daily.   ondansetron (ZOFRAN) 4 MG tablet Take 4 mg by mouth every 4 (four) hours as needed for nausea or vomiting.   OXYGEN 2lpm as needed for sats less than 88% every 1 hour as needed   pantoprazole (PROTONIX) 40 MG tablet Take 40 mg by mouth daily.   PARoxetine (PAXIL) 30 MG tablet Take 30 mg by mouth daily.   Polyethyl Glycol-Propyl Glycol 0.4-0.3 % SOLN Apply to eye 2 (two) times daily as needed.   polyethylene glycol (MIRALAX / GLYCOLAX) 17 g packet Take 17 g by mouth daily as needed.   PRADAXA 150 MG CAPS capsule TAKE 1 CAPSULE BY MOUTH TWICE DAILY START TREATMENT 10 HRS LAST DOSE OF LOVENOX   SUNSCREEN SPF50 EX Apply topically. Apply to exposed skin topically every 2 hours as needed for Sunburn Prevention   tiZANidine (ZANAFLEX) 2 MG tablet Take 2 mg by mouth every 12 (twelve) hours as needed for muscle spasms.   VITAMIN D, ERGOCALCIFEROL, PO Take one tablet ( ) by mouth once daily.   Zinc Oxide (TRIPLE PASTE) 12.8 % ointment Apply to sacrum/buttocks topically every shift.   No facility-administered encounter medications on file as of 01/30/2023.    Review of Systems:  Review of Systems  Health Maintenance  Topic Date Due   DTaP/Tdap/Td (1 - Tdap) Never done   INFLUENZA VACCINE  12/04/2022   Pneumonia Vaccine 21+ Years old (2 of 2 - PPSV23 or PCV20) 08/03/2026 (Originally 09/29/2018)   Zoster Vaccines- Shingrix (1 of 2) 11/02/2026 (Originally 04/30/1953)   HEMOGLOBIN A1C  03/06/2023   OPHTHALMOLOGY EXAM  10/30/2023   FOOT EXAM  12/11/2023   Medicare Annual Wellness (AWV)  12/11/2023   DEXA SCAN  Completed   HPV VACCINES  Aged Out   COVID-19 Vaccine  Discontinued    Physical Exam: There were no vitals filed for this  visit. There is no height or weight on  file to calculate BMI. Physical Exam  Labs reviewed: Basic Metabolic Panel: Recent Labs    01/31/22 1053 03/05/22 0000 06/26/22 0000 08/04/22 1033 08/11/22 0000 08/14/22 0000 10/16/22 0000  NA 137   < > 137 139 142  --  139  K 4.8   < > 4.9 4.0 4.7  --  4.3  CL 110   < > 102 105 108  --  104  CO2 23   < > 28* 27 27*  --  26*  GLUCOSE 236*  --   --  181*  --   --   --   BUN 22   < > 23* 17 17  --  16  CREATININE 0.74   < > 0.5 0.72 0.5  --  0.5  CALCIUM 9.7   < > 11.1* 10.8* 9.2  --  11.1*  TSH  --    < > 0.12*  --  0.02* 0.03*  --    < > = values in this interval not displayed.   Liver Function Tests: Recent Labs    03/05/22 0000 06/26/22 0000 10/16/22 0000  AST 18 19 18   ALT 10 13 13   ALKPHOS 65 58 55  ALBUMIN 4.0 3.8 4.1   No results for input(s): "LIPASE", "AMYLASE" in the last 8760 hours. No results for input(s): "AMMONIA" in the last 8760 hours. CBC: Recent Labs    06/26/22 0000 07/10/22 0000 08/04/22 1033 12/01/22 0000  WBC 8.5 9.2 8.9 7.8  NEUTROABS 5,347.00  --  6.3 4,360.00  HGB 13.8 14.1 13.4 13.6  HCT 43 44 43.0 41  MCV  --   --  88.5  --   PLT 173 165 229 197   Lipid Panel: No results for input(s): "CHOL", "HDL", "LDLCALC", "TRIG", "CHOLHDL", "LDLDIRECT" in the last 8760 hours. Lab Results  Component Value Date   HGBA1C 7.1 09/03/2022    Procedures since last visit: No results found.  Assessment/Plan     Labs/tests ordered:    Next appt:  Visit date not found

## 2023-02-02 ENCOUNTER — Encounter: Payer: Self-pay | Admitting: Adult Health

## 2023-02-03 ENCOUNTER — Encounter: Payer: Self-pay | Admitting: Oncology

## 2023-02-03 ENCOUNTER — Inpatient Hospital Stay (HOSPITAL_BASED_OUTPATIENT_CLINIC_OR_DEPARTMENT_OTHER): Payer: Medicare Other | Admitting: Oncology

## 2023-02-03 ENCOUNTER — Inpatient Hospital Stay: Payer: Medicare Other | Attending: Oncology

## 2023-02-03 ENCOUNTER — Non-Acute Institutional Stay (SKILLED_NURSING_FACILITY): Payer: Medicare Other | Admitting: Nurse Practitioner

## 2023-02-03 ENCOUNTER — Inpatient Hospital Stay: Payer: Medicare Other

## 2023-02-03 ENCOUNTER — Encounter: Payer: Self-pay | Admitting: Nurse Practitioner

## 2023-02-03 VITALS — BP 126/47 | HR 55 | Temp 96.5°F | Resp 16 | Ht 63.0 in

## 2023-02-03 DIAGNOSIS — Z7902 Long term (current) use of antithrombotics/antiplatelets: Secondary | ICD-10-CM | POA: Insufficient documentation

## 2023-02-03 DIAGNOSIS — Z1721 Progesterone receptor positive status: Secondary | ICD-10-CM | POA: Insufficient documentation

## 2023-02-03 DIAGNOSIS — L539 Erythematous condition, unspecified: Secondary | ICD-10-CM | POA: Diagnosis not present

## 2023-02-03 DIAGNOSIS — Z170 Estrogen receptor positive status [ER+]: Secondary | ICD-10-CM | POA: Insufficient documentation

## 2023-02-03 DIAGNOSIS — M810 Age-related osteoporosis without current pathological fracture: Secondary | ICD-10-CM

## 2023-02-03 DIAGNOSIS — Z923 Personal history of irradiation: Secondary | ICD-10-CM | POA: Diagnosis not present

## 2023-02-03 DIAGNOSIS — C50412 Malignant neoplasm of upper-outer quadrant of left female breast: Secondary | ICD-10-CM | POA: Diagnosis not present

## 2023-02-03 DIAGNOSIS — Z79811 Long term (current) use of aromatase inhibitors: Secondary | ICD-10-CM | POA: Insufficient documentation

## 2023-02-03 DIAGNOSIS — D6851 Activated protein C resistance: Secondary | ICD-10-CM | POA: Diagnosis not present

## 2023-02-03 DIAGNOSIS — Z853 Personal history of malignant neoplasm of breast: Secondary | ICD-10-CM

## 2023-02-03 DIAGNOSIS — Z08 Encounter for follow-up examination after completed treatment for malignant neoplasm: Secondary | ICD-10-CM

## 2023-02-03 LAB — BASIC METABOLIC PANEL
Anion gap: 10 (ref 5–15)
BUN: 19 mg/dL (ref 8–23)
CO2: 25 mmol/L (ref 22–32)
Calcium: 11.2 mg/dL — ABNORMAL HIGH (ref 8.9–10.3)
Chloride: 104 mmol/L (ref 98–111)
Creatinine, Ser: 0.52 mg/dL (ref 0.44–1.00)
GFR, Estimated: >60 mL/min (ref >60)
Glucose, Bld: 174 mg/dL — ABNORMAL HIGH (ref 70–99)
Potassium: 4 mmol/L (ref 3.5–5.1)
Sodium: 139 mmol/L (ref 135–145)

## 2023-02-03 LAB — CBC WITH DIFFERENTIAL/PLATELET
Abs Immature Granulocytes: 0.05 10*3/uL (ref 0.00–0.07)
Basophils Absolute: 0.1 10*3/uL (ref 0.0–0.1)
Basophils Relative: 1 %
Eosinophils Absolute: 0.2 10*3/uL (ref 0.0–0.5)
Eosinophils Relative: 2 %
HCT: 42.3 % (ref 36.0–46.0)
Hemoglobin: 13.4 g/dL (ref 12.0–15.0)
Immature Granulocytes: 1 %
Lymphocytes Relative: 25 %
Lymphs Abs: 2.1 10*3/uL (ref 0.7–4.0)
MCH: 29.1 pg (ref 26.0–34.0)
MCHC: 31.7 g/dL (ref 30.0–36.0)
MCV: 92 fL (ref 80.0–100.0)
Monocytes Absolute: 0.6 10*3/uL (ref 0.1–1.0)
Monocytes Relative: 8 %
Neutro Abs: 5.5 10*3/uL (ref 1.7–7.7)
Neutrophils Relative %: 63 %
Platelets: 174 10*3/uL (ref 150–400)
RBC: 4.6 MIL/uL (ref 3.87–5.11)
RDW: 13.6 % (ref 11.5–15.5)
WBC: 8.6 10*3/uL (ref 4.0–10.5)
nRBC: 0 % (ref 0.0–0.2)

## 2023-02-03 MED ORDER — DENOSUMAB 60 MG/ML ~~LOC~~ SOSY
60.0000 mg | PREFILLED_SYRINGE | Freq: Once | SUBCUTANEOUS | Status: AC
  Administered 2023-02-03: 60 mg via SUBCUTANEOUS
  Filled 2023-02-03: qty 1

## 2023-02-03 NOTE — Progress Notes (Signed)
Location: [facility] Room Number: [facility] Place of Service:  SNF ([phone]) Abbey Chatters, NP  PCP: Earnestine Mealing, MD  Patient Care Team: Earnestine Mealing, MD as PCP - General (Family Medicine) Lemar Livings, Merrily Pew, MD (General Surgery) Jeralyn Ruths, MD as Consulting Physician (Oncology)  Extended Emergency Contact Information Primary Emergency Contact: [name] Address: [address]          [place], [place] [address] [place] of [place] Home Phone: [phone] Mobile Phone: [phone] Relation: Daughter  Goals of care: Advanced Directive information    02/03/2023    9:30 AM  Advanced Directives  Does Patient Have a Medical Advance Directive? Yes  Type of Advance Directive Out of facility DNR (pink MOST or yellow form)  Does patient want to make changes to medical advance directive? No - Patient declined     Chief Complaint  Patient presents with   Acute Visit    Red Foot.     HPI:  Pt is a 89 y.o. female seen today for an acute visit for redness to the top of her right foot.  Staff noted right foot was red, warm and slightly tender to touch. Foot has a tendency to swell and the swelling is baseline.  Pt reports foot has been harder to get into shoe and feels like her shoe is a little tight.  No fever or chills.  She is on doxycycline currently for folliculitis.    Past Medical History:  Diagnosis Date   Anxiety    Benign breast lumps    Blood clotting disorder (HCC)    Bone cancer (HCC)    head of femur, left leg ... 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  Oncotype recurrence score: 1.  DCIS, margin less than 0.5 mm.  Negative sentinel node.  Accelerated partial breast radiation.   Cervicalgia    Chronic kidney disease    kidney stones   Colon cancer [facility])    patient unaware of this    Complication of anesthesia    mood alteration / not sure if d/t pain medicine or anesthesia as she passed out    Cough    NASAL DRIP / SNEEZING / SORE THROAT MOSTLY CONSTANT   Depression    Diabetes (HCC)    Diverticulitis    Dizzy    GERD (gastroesophageal reflux disease)    H/O blood clots    arm and leg   HBP (high blood pressure)    Hematuria    gross   Hemorrhoids    HLD (hyperlipidemia)    Microscopic hematuria 06/02/2015   Muscle pain    Osteoarthritis    Osteoporosis    Panic disorder    Personal history of radiation therapy    PND (post-nasal drip)    CHRONIC WITH SORE THROAT AND SNEEZING   Reflux    Sepsis (HCC)    Sepsis, unspecified organism (HCC) 12/15/2021   Formatting of this note might be different from the original.  Last Assessment & Plan: Formatting of this note might be different from the original. Appears to be multifactorial and secondary to healthcare associated pneumonia as well as a UTI CT angio shows asymmetric airspace disease at the left base and posteriorly in the right upper lobe concerning for pneumonia Patient also has pyuria Patient   Severe sepsis (HCC) 12/15/2021  Skin cancer    nose   Swelling    Tremor    Tremors of nervous system    Ureteral stone with hydronephrosis 11/26/2018   Past Surgical History:  Procedure Laterality Date   APPENDECTOMY  1962   BREAST BIOPSY Right 2006?   benign   BREAST BIOPSY Left 2019   invasive mammary carcinoma   BREAST CYST EXCISION Left 07/27/2017   Procedure: SKIN CYST EXCISED;  Surgeon: Earline Mayotte, MD;  Location: [facility] ORS;  Service: General;  Laterality: Left;   BREAST LUMPECTOMY Left 07/2017   invasive mammary, DCIS  mammosite   BREAST SURGERY Right 2019   CARPAL TUNNEL RELEASE Right    CATARACT EXTRACTION W/PHACO Left 11/11/2016   Procedure: CATARACT EXTRACTION PHACO AND INTRAOCULAR LENS PLACEMENT (IOC);  Surgeon: Galen Manila, MD;  Location: [facility] ORS;  Service: Ophthalmology;   Laterality: Left;  [place] 01:07.9 AP% 19.2 CDE 13.02 Fluid pack lot # [id number]   CATARACT EXTRACTION W/PHACO Right 12/09/2016   Procedure: CATARACT EXTRACTION PHACO AND INTRAOCULAR LENS PLACEMENT (IOC);  Surgeon: Galen Manila, MD;  Location: [facility] ORS;  Service: Ophthalmology;  Laterality: Right;  [place] 00:49 AP% 21.2 CDE 10.39 Fluid pack lot # [id number]   CHOLECYSTECTOMY     COLONOSCOPY WITH PROPOFOL N/A 11/22/2014   Procedure: COLONOSCOPY WITH PROPOFOL;  Surgeon: Scot Jun, MD;  Location: [facility];  Service: Endoscopy;  Laterality: N/A;   CYSTOSCOPY WITH STENT PLACEMENT Bilateral 11/27/2018   Procedure: CYSTOSCOPY WITH STENT PLACEMENT;  Surgeon: Jerilee Field, MD;  Location: [facility] ORS;  Service: Urology;  Laterality: Bilateral;   CYSTOSCOPY/URETEROSCOPY/HOLMIUM LASER/STENT PLACEMENT Bilateral 12/17/2018   Procedure: CYSTOSCOPY/URETEROSCOPY/HOLMIUM LASER/STENT Exchange;  Surgeon: Sondra Come, MD;  Location: [facility] ORS;  Service: Urology;  Laterality: Bilateral;   ESOPHAGOGASTRODUODENOSCOPY  11/22/2014   Procedure: ESOPHAGOGASTRODUODENOSCOPY (EGD);  Surgeon: Scot Jun, MD;  Location[facility] ENDOSCOPY;  Service: Endoscopy;;   ESOPHAGOGASTRODUODENOSCOPY (EGD) WITH PROPOFOL N/A 03/05/2017   Procedure: ESOPHAGOGASTRODUODENOSCOPY (EGD) WITH PROPOFOL;  Surgeon: Toney Reil, MD;  Location: [facility] ENDOSCOPY;  Service: Gastroenterology;  Laterality: N/A;   EXTRACORPOREAL SHOCK WAVE LITHOTRIPSY Right 03/04/2018   Procedure: EXTRACORPOREAL SHOCK WAVE LITHOTRIPSY (ESWL);  Surgeon: Sondra Come, MD;  Location: [facility] ORS;  Service: Urology;  Laterality: Right;   EYE SURGERY Bilateral 2012   cataract extraction with iol   GALLBLADDER SURGERY  1968   hemi pelvectomy     left side at age 69   HEMIPELVIC Left 1962   LEG SURGERY Left    AMPUTATION d/t cancer at 89 years old   MASTECTOMY, PARTIAL Left 07/27/2017   Procedure: MASTECTOMY PARTIAL;  Surgeon: Earline Mayotte, MD;   Location: [facility] ORS;  Service: General;  Laterality: Left;   MOUTH SURGERY  2019   teeth pulled with a bridge insertion.   fell out 12/16/18   OVARY SURGERY Right    cyst removed   SAVORY DILATION  11/22/2014   Procedure: SAVORY DILATION;  Surgeon: Scot Jun, MD;  Location: [facility] ENDOSCOPY;  Service: Endoscopy;;   SENTINEL NODE BIOPSY Left 07/27/2017   Procedure: SENTINEL NODE BIOPSY;  Surgeon: Earline Mayotte, MD;  Location: [facility] ORS;  Service: General;  Laterality: Left;   SKIN CANCER EXCISION     nose and arm and face   TEE WITHOUT CARDIOVERSION N/A 07/25/2019   Procedure: TRANSESOPHAGEAL ECHOCARDIOGRAM (TEE);  Surgeon: Lamar Blinks, MD;  Location: [facility] ORS;  Service: Cardiovascular;  Laterality: N/A;   TONSILLECTOMY      Allergies  Allergen Reactions   Ciprofloxacin Hives   Codeine Other (See Comments)    HALLUCINATIONS   Tape     Rash and skin irritation/ paper tape and tegaderm OK   Ciprocinonide [Fluocinolone] Other (See Comments)    unknown   Amoxicillin Other (See Comments)    Upset stomach Has patient had a PCN reaction causing immediate rash, facial/tongue/throat swelling, SOB or lightheadedness with hypotension: No Has patient had a PCN reaction causing severe rash involving mucus membranes or skin necrosis: No Has patient had a PCN reaction that required hospitalization: No Has patient had a PCN reaction occurring within the last 10 years: Yes If all of the above answers are "NO", then may proceed with Cephalosporin use.;   Cefuroxime Other (See Comments)    OK INTRACAMERALLY PER DR WLP, upset stomach   Latex Rash    Rast test NEGATIVE   Lipitor [Atorvastatin] Other (See Comments)    unknown    Outpatient Encounter Medications as of 02/03/2023  Medication Sig   acetaminophen (TYLENOL) 500 MG tablet Take 500 mg by mouth every 6 (six) hours as needed.   acetaminophen (TYLENOL) 650 MG CR tablet Take 650 mg by mouth every 8 (eight) hours as needed for pain.  Give one tablet by mouth three times daily.   dextromethorphan-guaiFENesin (ROBITUSSIN-DM) 10-100 MG/5ML liquid Take 10 mLs by mouth every 4 (four) hours as needed for cough.   diclofenac Sodium (VOLTAREN) 1 % GEL Apply 4 g topically. Apply to stump topically every 6 hours as needed. Apply to right knee topically three times daily for pain.   doxycycline (VIBRA-TABS) 100 MG tablet Take 1 tablet (100 mg total) by mouth 2 (two) times daily for 10 days.   fluconazole (DIFLUCAN) 150 MG tablet Take 150 mg by mouth once.   gabapentin (NEURONTIN) 100 MG capsule Take 100 mg by mouth 3 (three) times daily.   GLIPIZIDE XL 2.5 MG 24 hr tablet Take 2.5 mg by mouth daily.    hydrocortisone 2.5 % cream Apply to Hemorrhoids/buttucks topically every 8 hours as needed for hemorrhoids.   hydrocortisone cream 1 % Apply to rectum topically every 6 hours as needed   loratadine (CLARITIN) 10 MG tablet Take 10 mg by mouth daily.   melatonin 3 MG TABS tablet Take 3 mg by mouth at bedtime.   metFORMIN (GLUCOPHAGE) 500 MG tablet Take 750 mg by mouth 2 (two) times daily with a meal.   metoprolol tartrate (LOPRESSOR) 50 MG tablet Take 50 mg by mouth 2 (two) times daily.   ondansetron (ZOFRAN) 4 MG tablet Take 4 mg by mouth every 4 (four) hours as needed for nausea or vomiting.   OXYGEN 2lpm as needed for sats less than 88% every 1 hour as needed   pantoprazole (PROTONIX) 40 MG tablet Take 40 mg by mouth daily.   PARoxetine (PAXIL) 30 MG tablet Take 30 mg by mouth daily.   Polyethyl Glycol-Propyl Glycol 0.4-0.3 % SOLN Apply to eye 2 (two) times daily as needed.   polyethylene glycol (MIRALAX / GLYCOLAX) 17 g packet Take 17 g by mouth daily as needed.   PRADAXA 150 MG CAPS capsule TAKE 1 CAPSULE BY MOUTH TWICE DAILY START TREATMENT 10 HRS LAST DOSE OF LOVENOX   SUNSCREEN SPF50 EX Apply topically. Apply to exposed skin topically every 2 hours as needed for Sunburn Prevention   tiZANidine (ZANAFLEX) 2 MG tablet Take 2 mg by  mouth every 12 (twelve) hours as needed for muscle spasms.   VITAMIN  D, ERGOCALCIFEROL, PO Take one tablet ( ) by mouth once daily.   Zinc Oxide (TRIPLE PASTE) 12.8 % ointment Apply to sacrum/buttocks topically every shift.   No facility-administered encounter medications on file as of 02/03/2023.    Review of Systems  Constitutional:  Negative for fever.  Cardiovascular:  Positive for leg swelling.  Musculoskeletal:  Positive for myalgias.  Skin:  Positive for color change.    Immunization History  Administered Date(s) Administered   Influenza-Unspecified 02/03/2020, 02/18/2021, 02/18/2022   Moderna Covid-19 Fall Seasonal Vaccine 58yrs & older 08/12/2022   PNEUMOCOCCAL CONJUGATE-20 01/17/2023   Pneumococcal Conjugate-13 09/28/2017   Tdap 12/31/2022   Unspecified SARS-COV-2 Vaccination 05/20/2019, 05/20/2019, 06/17/2019, 03/16/2020, 09/20/2020, 01/25/2021, 01/30/2023   Pertinent  Health Maintenance Due  Topic Date Due   INFLUENZA VACCINE  12/04/2022   HEMOGLOBIN A1C  03/06/2023   OPHTHALMOLOGY EXAM  10/30/2023   FOOT EXAM  12/11/2023   DEXA SCAN  Completed      12/18/2021    7:45 PM 12/19/2021    7:00 AM 12/19/2021   12:00 PM 01/24/2022   11:20 AM 12/11/2022    1:31 PM  Fall Risk  Falls in the past year?     0  (RETIRED) Patient Fall Risk Level High fall risk High fall risk High fall risk High fall risk    Functional Status Survey:    Vitals:   02/03/23 0916  BP: (!) 142/74  Pulse: 79  Resp: 20  Temp: (!) 97.1 F (36.2 C)  SpO2: 90%  Weight: 159 lb 3.2 oz (72.2 kg)  Height: 5\' 3"  (1.6 m)   Body mass index is 28.2 kg/m. Physical Exam Constitutional:      Appearance: Normal appearance.  Pulmonary:     Effort: Pulmonary effort is normal.  Skin:    General: Skin is warm and dry.     Findings: Erythema (noted to dorsal aspect of foot. nontender on exam) present.  Neurological:     Mental Status: She is alert. Mental status is at baseline.  Psychiatric:         Mood and Affect: Mood normal.     Labs reviewed: Recent Labs    08/04/22 1033 08/11/22 0000 10/16/22 0000  NA 139 142 139  K 4.0 4.7 4.3  CL 105 108 104  CO2 27 27* 26*  GLUCOSE 181*  --   --   BUN 17 17 16   CREATININE 0.72 0.5 0.5  CALCIUM 10.8* 9.2 11.1*   Recent Labs    03/05/22 0000 06/26/22 0000 10/16/22 0000  AST 18 19 18   ALT 10 13 13   ALKPHOS 65 58 55  ALBUMIN 4.0 3.8 4.1   Recent Labs    06/26/22 0000 07/10/22 0000 08/04/22 1033 12/01/22 0000  WBC 8.5 9.2 8.9 7.8  NEUTROABS 5,347.00  --  6.3 4,360.00  HGB 13.8 14.1 13.4 13.6  HCT 43 44 43.0 41  MCV  --   --  88.5  --   PLT 173 165 229 197   Lab Results  Component Value Date   TSH 0.02 (A) 12/11/2022   Lab Results  Component Value Date   HGBA1C 7.1 09/03/2022   Lab Results  Component Value Date   CHOL 110 12/15/2021   HDL 19 (L) 12/15/2021   LDLCALC 46 12/15/2021   TRIG 223 (H) 12/15/2021   CHOLHDL 5.8 12/15/2021    Significant Diagnostic Results in last 30 days:  No results found.  Assessment/Plan 1. Redness of skin Noted to  top of foot, mildly warm.  She is already on doxycycline for folliculitis.  Likely due to shoe being too tight due to swelling Encouraged to keep shoe off and monitor. To notify if redness worsen or spread No wounds or open skin noted.  Will monitor.    Janene Harvey. Biagio Borg [facility] [phone]

## 2023-02-03 NOTE — Progress Notes (Signed)
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  Oncotype DX score 1.  Homozygous factor V Leiden.  INTERVAL HISTORY: Patient returns to clinic today for routine 68-month evaluation and continuation of Prolia.  She currently feels well.  She continues to tolerate Pradaxa and letrozole without significant side effects. She has no neurologic complaints.  She denies any recent fevers or illnesses.  She has a good appetite and denies weight loss.  She denies any chest pain, shortness of breath, cough, or hemoptysis.  She denies any nausea, vomiting, constipation, or diarrhea.  She has no urinary complaints.  Patient offers no further specific complaints today.  REVIEW OF SYSTEMS:   Review of Systems  Constitutional: Negative.  Negative for fever, malaise/fatigue and weight loss.  Respiratory: Negative.  Negative for cough and shortness of breath.   Cardiovascular: Negative.  Negative for chest pain and leg swelling.  Gastrointestinal: Negative.  Negative for abdominal pain.  Genitourinary:  Negative for dysuria and flank pain.  Musculoskeletal:  Positive for falls. Negative for joint pain.  Skin: Negative.  Negative for rash.  Neurological: Negative.  Negative for dizziness, sensory change, weakness and headaches.  Psychiatric/Behavioral: Negative.  The patient is not nervous/anxious.     As per HPI. Otherwise, a complete review of systems is negative.   PAST MEDICAL HISTORY: Reviewed and unchanged.  PAST SURGICAL HISTORY: Past Surgical History:  Procedure Laterality Date    APPENDECTOMY  1962   BREAST BIOPSY Right 2006?   benign   BREAST BIOPSY Left 2019   invasive mammary carcinoma   BREAST CYST EXCISION Left 07/27/2017   Procedure: SKIN CYST EXCISED;  Surgeon: Earline Mayotte, MD;  Location: [facility] ORS;  Service: General;  Laterality: Left;   BREAST LUMPECTOMY Left 07/2017   invasive mammary, DCIS  mammosite   BREAST SURGERY Right 2019   CARPAL TUNNEL RELEASE Right    CATARACT EXTRACTION W/PHACO Left 11/11/2016   Procedure: CATARACT EXTRACTION PHACO AND INTRAOCULAR LENS PLACEMENT (IOC);  Surgeon: Galen Manila, MD;  Location: [facility];  Service: Ophthalmology;  Laterality: Left;  [place] 01:07.9 AP% 19.2 CDE 13.02 Fluid pack lot # [id number]   CATARACT EXTRACTION W/PHACO Right 12/09/2016   Procedure: CATARACT EXTRACTION PHACO AND INTRAOCULAR LENS PLACEMENT (IOC);  Surgeon: Galen Manila, MD;  Location: [facility];  Service: Ophthalmology;  Laterality: Right;  [place] 00:49 AP% 21.2 CDE 10.39 Fluid pack lot # [id number]   CHOLECYSTECTOMY     COLONOSCOPY WITH PROPOFOL N/A 11/22/2014   Procedure: COLONOSCOPY WITH PROPOFOL;  Surgeon: Scot Jun, MD;  Location[facility];  Service: Endoscopy;  Laterality: N/A;   CYSTOSCOPY WITH STENT PLACEMENT Bilateral 11/27/2018   Procedure: CYSTOSCOPY WITH STENT PLACEMENT;  Surgeon: Jerilee Field, MD;  Location: [facility];  Service: Urology;  Laterality: Bilateral;   CYSTOSCOPY/URETEROSCOPY/HOLMIUM LASER/STENT PLACEMENT Bilateral 12/17/2018   Procedure: CYSTOSCOPY/URETEROSCOPY/HOLMIUM LASER/STENT Exchange;  Surgeon: Sondra Come, MD;  Location: [facility];  Service: Urology;  Laterality: Bilateral;   ESOPHAGOGASTRODUODENOSCOPY  11/22/2014   Procedure: ESOPHAGOGASTRODUODENOSCOPY (EGD);  Surgeon: Scot Jun, MD;  Location: [facility] ENDOSCOPY;  Service: Endoscopy;;   ESOPHAGOGASTRODUODENOSCOPY (EGD) WITH PROPOFOL N/A 03/05/2017   Procedure: ESOPHAGOGASTRODUODENOSCOPY (  EGD) WITH PROPOFOL;  Surgeon: Toney Reil, MD;   Location: [facility] ENDOSCOPY;  Service: Gastroenterology;  Laterality: N/A;   EXTRACORPOREAL SHOCK WAVE LITHOTRIPSY Right 03/04/2018   Procedure: EXTRACORPOREAL SHOCK WAVE LITHOTRIPSY (ESWL);  Surgeon: Sondra Come, MD;  Location: [facility] OR[facility];  Service: Urology;  Laterality: Right;   EYE SURGERY Bilateral 2012   cataract extraction with iol   GALLBLADDER SURGERY  1968   hemi pelvectomy     left side at age 63   HEMIPELVIC Left 1962   LEG SURGERY Left    AMPUTATION d/t cancer at 89 years old   MASTECTOMY, PARTIAL Left 07/27/2017   Procedure: MASTECTOMY PARTIAL;  Surgeon: Earline Mayotte, MD;  Location: [facility];  Service: General;  Laterality: Left;   MOUTH SURGERY  2019   teeth pulled with a bridge insertion.   fell out 12/16/18   OVARY SURGERY Right    cyst removed   SAVORY DILATION  11/22/2014   Procedure: SAVORY DILATION;  Surgeon: Scot Jun, MD;  Location: [facility] ENDOSCOPY;  Service: Endoscopy;;   SENTINEL NODE BIOPSY Left 07/27/2017   Procedure: SENTINEL NODE BIOPSY;  Surgeon: Earline Mayotte, MD;  Location: [facility] OR[facility];  Service: General;  Laterality: Left;   SKIN CANCER EXCISION     nose and arm and face   TEE WITHOUT CARDIOVERSION N/A 07/25/2019   Procedure: TRANSESOPHAGEAL ECHOCARDIOGRAM (TEE);  Surgeon: Lamar Blinks, MD;  Location: [facility] OR[facility];  Service: Cardiovascular;  Laterality: N/A;   TONSILLECTOMY      FAMILY HISTORY: Family History  Problem Relation Age of Onset   Breast cancer Paternal Aunt    Ovarian cancer Sister    Prostate cancer Father    Stroke Mother    Kidney cancer Neg Hx    Bladder Cancer Neg Hx     ADVANCED DIRECTIVES (Y/N):  N  HEALTH MAINTENANCE: Social History   Tobacco Use   Smoking status: Never   Smokeless tobacco: Never  Vaping Use   Vaping status: Never Used  Substance Use Topics   Alcohol use: No   Drug use: No     Colonoscopy:  PAP:  Bone density:  Lipid panel:  Allergies  Allergen Reactions   Ciprofloxacin Hives    Codeine Other (See Comments)    HALLUCINATIONS   Tape     Rash and skin irritation/ paper tape and tegaderm OK   Ciprocinonide [Fluocinolone] Other (See Comments)    unknown   Amoxicillin Other (See Comments)    Upset stomach Has patient had a PCN reaction causing immediate rash, facial/tongue/throat swelling, SOB or lightheadedness with hypotension: No Has patient had a PCN reaction causing severe rash involving mucus membranes or skin necrosis: No Has patient had a PCN reaction that required hospitalization: No Has patient had a PCN reaction occurring within the last 10 years: Yes If all of the above answers are "NO", then may proceed with Cephalosporin use.;   Cefuroxime Other (See Comments)    OK INTRACAMERALLY PER DR WLP, upset stomach   Latex Rash    Rast test NEGATIVE   Lipitor [Atorvastatin] Other (See Comments)    unknown    Current Outpatient Medications  Medication Sig Dispense Refill   acetaminophen (TYLENOL) 500 MG tablet Take 500 mg by mouth every 6 (six) hours as needed.     acetaminophen (TYLENOL) 650 MG CR tablet Take 650 mg by mouth every 8 (eight) hours as needed for pain. Give one tablet by mouth three times daily.  diclofenac Sodium (VOLTAREN) 1 % GEL Apply 4 g topically. Apply to stump topically every 6 hours as needed. Apply to right knee topically three times daily for pain.     doxycycline (VIBRA-TABS) 100 MG tablet Take 1 tablet (100 mg total) by mouth 2 (two) times daily for 10 days.     fluconazole (DIFLUCAN) 150 MG tablet Take 150 mg by mouth once.     gabapentin (NEURONTIN) 100 MG capsule Take 100 mg by mouth 3 (three) times daily.     GLIPIZIDE XL 2.5 MG 24 hr tablet Take 2.5 mg by mouth daily.      hydrocortisone 2.5 % cream Apply to Hemorrhoids/buttucks topically every 8 hours as needed for hemorrhoids.     hydrocortisone cream 1 % Apply to rectum topically every 6 hours as needed     loratadine (CLARITIN) 10 MG tablet Take 10 mg by mouth daily.      melatonin 3 MG TABS tablet Take 3 mg by mouth at bedtime.     metFORMIN (GLUCOPHAGE) 500 MG tablet Take 750 mg by mouth 2 (two) times daily with a meal.     metoprolol tartrate (LOPRESSOR) 50 MG tablet Take 50 mg by mouth 2 (two) times daily.     ondansetron (ZOFRAN) 4 MG tablet Take 4 mg by mouth every 4 (four) hours as needed for nausea or vomiting.     OXYGEN 2lpm as needed for sats less than 88% every 1 hour as needed     pantoprazole (PROTONIX) 40 MG tablet Take 40 mg by mouth daily.     PARoxetine (PAXIL) 30 MG tablet Take 30 mg by mouth daily.     Polyethyl Glycol-Propyl Glycol 0.4-0.3 % SOLN Apply to eye 2 (two) times daily as needed.     polyethylene glycol (MIRALAX / GLYCOLAX) 17 g packet Take 17 g by mouth daily as needed.     PRADAXA 150 MG CAPS capsule TAKE 1 CAPSULE BY MOUTH TWICE DAILY START TREATMENT 10 HRS LAST DOSE OF LOVENOX 60 capsule 2   SUNSCREEN SPF50 EX Apply topically. Apply to exposed skin topically every 2 hours as needed for Sunburn Prevention     tiZANidine (ZANAFLEX) 2 MG tablet Take 2 mg by mouth every 12 (twelve) hours as needed for muscle spasms.     VITAMIN D, ERGOCALCIFEROL, PO Take one tablet ( ) by mouth once daily.     Zinc Oxide (TRIPLE PASTE) 12.8 % ointment Apply to sacrum/buttocks topically every shift.     dextromethorphan-guaiFENesin (ROBITUSSIN-DM) 10-100 MG/5ML liquid Take 10 mLs by mouth every 4 (four) hours as needed for cough. (Patient not taking: Reported on 02/03/2023)     No current facility-administered medications for this visit.    OBJECTIVE: Vitals:   02/03/23 1016  BP: (!) 126/47  Pulse: (!) 55  Resp: 16  Temp: (!) 96.5 F (35.8 C)  SpO2: 96%       Body mass index is 28.2 kg/m.    ECOG FS:1 - Symptomatic but completely ambulatory  General: Well-developed, well-nourished, no acute distress.  Sitting in wheelchair. Eyes: Pink conjunctiva, anicteric sclera. HEENT: Normocephalic, moist mucous membranes. Lungs: No audible  wheezing or coughing. Heart: Regular rate and rhythm. Abdomen: Soft, nontender, no obvious distention. Musculoskeletal: No edema, cyanosis, or clubbing.  Left hemipelvectomy. Neuro: Alert, answering all questions appropriately. Cranial nerves grossly intact. Skin: No rashes or petechiae noted. Psych: Normal affect.    LAB RESULTS:  Lab Results  Component Value Date   NA 139  02/03/2023   K 4.0 02/03/2023   CL 104 02/03/2023   CO2 25 02/03/2023   GLUCOSE 174 (H) 02/03/2023   BUN 19 02/03/2023   CREATININE 0.52 02/03/2023   CALCIUM 11.2 (H) 02/03/2023   PROT 6.0 (L) 12/17/2021   ALBUMIN 4.1 10/16/2022   AST 18 10/16/2022   ALT 13 10/16/2022   ALKPHOS 55 10/16/2022   BILITOT 0.5 12/17/2021   GFRNONAA >60 02/03/2023   GFRAA >60 01/24/2020    Lab Results  Component Value Date   WBC 8.6 02/03/2023   NEUTROABS 5.5 02/03/2023   HGB 13.4 02/03/2023   HCT 42.3 02/03/2023   MCV 92.0 02/03/2023   PLT 174 02/03/2023     STUDIES: No results found.  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  Homozygous factor V Leiden.  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  Patient had a lumpectomy on July 27, 2017.  She completed radiation with MammoSite treatment.  Patient has now completed 5 years of letrozole and has been instructed to discontinue treatment.  Her most recent mammogram on October 21, 2021 was reported as BI-RADS 1.  Repeat in the next several weeks.   Osteoporosis: Patient's most recent bone mineral density on October 21, 2021 reported T-score of -2.2 which basically effectively improved from 1 year prior where her T-score is Fortamet -3.5.  Proceed with Prolia today.  Repeat bone mineral density  along with mammogram as above.  If her T-score continues to improve, can consider discontinuing Prolia.  Return to clinic in 6 months for further evaluation and continuation of treatment if needed.   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 Homozygous factor V Leiden: Patient had multiple splenic infarcts suspicious for thrombosis while taking Xarelto.  Subsequent hypercoagulable work-up revealed the patient is homozygous for factor V Leiden.  Continue Pradaxa 150 mg every 12 hours.  She will require lifelong anticoagulation.    I spent a total of 30 minutes reviewing chart data, face-to-face evaluation with the patient, counseling and coordination of care as detailed above.   Patient expressed understanding and was in agreement with this plan. She also understands that She can call clinic at any time with any questions, concerns, or complaints.    Cancer Staging  Primary cancer of upper outer quadrant of left female breast [facility]) Staging form: Breast, AJCC 8th Edition - Clinical stage from 06/12/2017: Stage IA (cT1b, cN0, cM0, G2, ER+, PR+, HER2-) - Signed by Jeralyn Ruths, MD on 06/12/2017 Histologic grading system: 3 grade system Laterality: Left   Jeralyn Ruths, MD   02/03/2023 1:13 PM

## 2023-02-06 ENCOUNTER — Encounter (INDEPENDENT_AMBULATORY_CARE_PROVIDER_SITE_OTHER): Payer: Self-pay | Admitting: Vascular Surgery

## 2023-02-09 ENCOUNTER — Encounter: Payer: Self-pay | Admitting: Student

## 2023-02-09 ENCOUNTER — Non-Acute Institutional Stay (SKILLED_NURSING_FACILITY): Payer: Medicare Other | Admitting: Student

## 2023-02-09 DIAGNOSIS — E1169 Type 2 diabetes mellitus with other specified complication: Secondary | ICD-10-CM | POA: Diagnosis not present

## 2023-02-09 DIAGNOSIS — E213 Hyperparathyroidism, unspecified: Secondary | ICD-10-CM

## 2023-02-09 DIAGNOSIS — S88912S Complete traumatic amputation of left lower leg, level unspecified, sequela: Secondary | ICD-10-CM

## 2023-02-09 DIAGNOSIS — E0590 Thyrotoxicosis, unspecified without thyrotoxic crisis or storm: Secondary | ICD-10-CM

## 2023-02-09 DIAGNOSIS — F3341 Major depressive disorder, recurrent, in partial remission: Secondary | ICD-10-CM

## 2023-02-09 DIAGNOSIS — J3489 Other specified disorders of nose and nasal sinuses: Secondary | ICD-10-CM

## 2023-02-09 DIAGNOSIS — I480 Paroxysmal atrial fibrillation: Secondary | ICD-10-CM

## 2023-02-09 DIAGNOSIS — D6851 Activated protein C resistance: Secondary | ICD-10-CM | POA: Diagnosis not present

## 2023-02-09 DIAGNOSIS — I739 Peripheral vascular disease, unspecified: Secondary | ICD-10-CM

## 2023-02-09 DIAGNOSIS — I10 Essential (primary) hypertension: Secondary | ICD-10-CM

## 2023-02-09 NOTE — Progress Notes (Signed)
Location:  Other [place].  Nursing Home Room Number: [facility] [address] Place of Service:  SNF [phone]) Provider:  Earnestine Mealing, MD  Patient Care Team: Earnestine Mealing, MD as PCP - General (Family Medicine) Lemar Livings Merrily Pew, MD (General Surgery) Jeralyn Ruths, MD as Consulting Physician (Oncology)  Extended Emergency Contact Information Primary Emergency Contact: [name] Address: [address]          [place], [place] [address] [place] of [place] Home Phone: [phone] Mobile Phone: [phone] Relation: Daughter  Code Status:  DNR Goals of care: Advanced Directive information    02/03/2023   10:20 AM  Advanced Directives  Does Patient Have a Medical Advance Directive? Yes  Type of Estate agent of [place];Living will     Chief Complaint  Patient presents with   Medical Management of Chronic Issues    Medical Management of Chronic Issues.     HPI:  Pt is a 89 y.o. female seen today for medical management of chronic diseases.    Patient states she is doing well in general.  She has had a runny nose and sore throat for a year.  She states it has been on and off she has never been febrile she has never been ill.  She has never taken anything for it.   She requires assistance for using the restroom which has been a challenge for her to wait for assistance.  Denies issues with urination or constipation at this time.  She has chronic right knee pain for which she uses diclofenac gel with some relief.  No recent episodes of confusion to her knowledge.  Nursing without concerns at this time.  She takes her pills including holding. Past Medical History:  Diagnosis Date   Anxiety    Benign breast lumps    Blood clotting disorder (HCC)    Bone cancer (HCC)    head of femur, left leg ... 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  Oncotype recurrence score: 1.  DCIS, margin less than 0.5 mm.  Negative sentinel node.  Accelerated partial breast radiation.   Cervicalgia    Chronic kidney disease    kidney stones   Colon cancer [facility])    patient unaware of this   Complication of anesthesia    mood alteration / not sure if d/t pain medicine or anesthesia as she passed out    Cough    NASAL DRIP / SNEEZING / SORE THROAT MOSTLY CONSTANT   Depression    Diabetes (HCC)    Diverticulitis    Dizzy    GERD (gastroesophageal reflux disease)    H/O blood clots    arm and leg   HBP (high blood pressure)    Hematuria    gross   Hemorrhoids    HLD (hyperlipidemia)    Microscopic hematuria 06/02/2015   Muscle pain    Osteoarthritis    Osteoporosis    Panic disorder    Personal history of radiation therapy    PND (post-nasal drip)    CHRONIC WITH SORE THROAT AND SNEEZING   Reflux    Sepsis (HCC)    Sepsis, unspecified organism (HCC) 12/15/2021   Formatting of this note might be different from the original.  Last Assessment & Plan: Formatting of this note might be different from the original. Appears to be multifactorial and  secondary to healthcare associated pneumonia as well as a UTI CT angio shows asymmetric airspace disease at the left base and posteriorly in the right upper lobe concerning for pneumonia Patient also has pyuria Patient   Severe sepsis (HCC) 12/15/2021   Skin cancer    nose   Swelling    Tremor    Tremors of nervous system    Ureteral stone with hydronephrosis 11/26/2018   Past Surgical History:  Procedure Laterality Date   APPENDECTOMY  1962   BREAST BIOPSY Right 2006?   benign   BREAST BIOPSY Left 2019   invasive mammary carcinoma   BREAST CYST EXCISION Left 07/27/2017   Procedure: SKIN CYST EXCISED;  Surgeon: Earline Mayotte, MD;  Location: [facility] ORS;  Service: General;  Laterality: Left;   BREAST LUMPECTOMY Left 07/2017   invasive mammary, DCIS  mammosite    BREAST SURGERY Right 2019   CARPAL TUNNEL RELEASE Right    CATARACT EXTRACTION W/PHACO Left 11/11/2016   Procedure: CATARACT EXTRACTION PHACO AND INTRAOCULAR LENS PLACEMENT (IOC);  Surgeon: Galen Manila, MD;  Location: [facility] ORS;  Service: Ophthalmology;  Laterality: Left;  [place] 01:07.9 AP% 19.2 CDE 13.02 Fluid pack lot # [id number]   CATARACT EXTRACTION W/PHACO Right 12/09/2016   Procedure: CATARACT EXTRACTION PHACO AND INTRAOCULAR LENS PLACEMENT (IOC);  Surgeon: Galen Manila, MD;  Location: [facility] ORS;  Service: Ophthalmology;  Laterality: Right;  [place] 00:49 AP% 21.2 CDE 10.39 Fluid pack lot # [id number]   CHOLECYSTECTOMY     COLONOSCOPY WITH PROPOFOL N/A 11/22/2014   Procedure: COLONOSCOPY WITH PROPOFOL;  Surgeon: Scot Jun, MD;  Location[facility] ENDOSCOPY;  Service: Endoscopy;  Laterality: N/A;   CYSTOSCOPY WITH STENT PLACEMENT Bilateral 11/27/2018   Procedure: CYSTOSCOPY WITH STENT PLACEMENT;  Surgeon: Jerilee Field, MD;  Location: [facility] ORS;  Service: Urology;  Laterality: Bilateral;   CYSTOSCOPY/URETEROSCOPY/HOLMIUM LASER/STENT PLACEMENT Bilateral 12/17/2018   Procedure: CYSTOSCOPY/URETEROSCOPY/HOLMIUM LASER/STENT Exchange;  Surgeon: Sondra Come, MD;  Location: [facility] ORS;  Service: Urology;  Laterality: Bilateral;   ESOPHAGOGASTRODUODENOSCOPY  11/22/2014   Procedure: ESOPHAGOGASTRODUODENOSCOPY (EGD);  Surgeon: Scot Jun, MD;  Location: [facility] ENDOSCOPY;  Service: Endoscopy;;   ESOPHAGOGASTRODUODENOSCOPY (EGD) WITH PROPOFOL N/A 03/05/2017   Procedure: ESOPHAGOGASTRODUODENOSCOPY (EGD) WITH PROPOFOL;  Surgeon: Toney Reil, MD;  Location: [facility] ENDOSCOPY;  Service: Gastroenterology;  Laterality: N/A;   EXTRACORPOREAL SHOCK WAVE LITHOTRIPSY Right 03/04/2018   Procedure: EXTRACORPOREAL SHOCK WAVE LITHOTRIPSY (ESWL);  Surgeon: Sondra Come, MD;  Location: [facility] ORS;  Service: Urology;  Laterality: Right;   EYE SURGERY Bilateral 2012   cataract extraction with iol    GALLBLADDER SURGERY  1968   hemi pelvectomy     left side at age 70   HEMIPELVIC Left 1962   LEG SURGERY Left    AMPUTATION d/t cancer at 89 years old   MASTECTOMY, PARTIAL Left 07/27/2017   Procedure: MASTECTOMY PARTIAL;  Surgeon: Earline Mayotte, MD;  Location: [facility] ORS;  Service: General;  Laterality: Left;   MOUTH SURGERY  2019   teeth pulled with a bridge insertion.   fell out 12/16/18   OVARY SURGERY Right    cyst removed   SAVORY DILATION  11/22/2014   Procedure: SAVORY DILATION;  Surgeon: Scot Jun, MD;  Location: [facility] ENDOSCOPY;  Service: Endoscopy;;   SENTINEL NODE BIOPSY Left 07/27/2017   Procedure: SENTINEL NODE BIOPSY;  Surgeon: Earline Mayotte, MD;  Location: [facility] ORS;  Service: General;  Laterality: Left;   SKIN CANCER EXCISION  nose and arm and face   TEE WITHOUT CARDIOVERSION N/A 07/25/2019   Procedure: TRANSESOPHAGEAL ECHOCARDIOGRAM (TEE);  Surgeon: Lamar Blinks, MD;  Location: [facility] ORS;  Service: Cardiovascular;  Laterality: N/A;   TONSILLECTOMY      Allergies  Allergen Reactions   Ciprofloxacin Hives   Codeine Other (See Comments)    HALLUCINATIONS   Tape     Rash and skin irritation/ paper tape and tegaderm OK   Ciprocinonide [Fluocinolone] Other (See Comments)    unknown   Amoxicillin Other (See Comments)    Upset stomach Has patient had a PCN reaction causing immediate rash, facial/tongue/throat swelling, SOB or lightheadedness with hypotension: No Has patient had a PCN reaction causing severe rash involving mucus membranes or skin necrosis: No Has patient had a PCN reaction that required hospitalization: No Has patient had a PCN reaction occurring within the last 10 years: Yes If all of the above answers are "NO", then may proceed with Cephalosporin use.;   Cefuroxime Other (See Comments)    OK INTRACAMERALLY PER DR WLP, upset stomach   Latex Rash    Rast test NEGATIVE   Lipitor [Atorvastatin] Other (See Comments)    unknown     Outpatient Encounter Medications as of 02/09/2023  Medication Sig   acetaminophen (TYLENOL) 650 MG CR tablet Take 650 mg by mouth every 8 (eight) hours as needed for pain. Give one tablet by mouth three times daily.   dextromethorphan-guaiFENesin (ROBITUSSIN-DM) 10-100 MG/5ML liquid Take 10 mLs by mouth every 4 (four) hours as needed for cough.   diclofenac Sodium (VOLTAREN) 1 % GEL Apply 4 g topically. Apply to stump topically every 6 hours as needed. Apply to right knee topically three times daily for pain.   doxycycline (VIBRA-TABS) 100 MG tablet Take 1 tablet (100 mg total) by mouth 2 (two) times daily for 10 days.   fluconazole (DIFLUCAN) 150 MG tablet Take 150 mg by mouth once.   fluticasone (FLONASE) 50 MCG/ACT nasal spray Place 2 sprays into both nostrils 2 (two) times daily.   gabapentin (NEURONTIN) 100 MG capsule Take 100 mg by mouth 3 (three) times daily.   GLIPIZIDE XL 2.5 MG 24 hr tablet Take 2.5 mg by mouth daily.    hydrocortisone 2.5 % cream Apply to Hemorrhoids/buttucks topically every 8 hours as needed for hemorrhoids.   hydrocortisone cream 1 % Apply to rectum topically every 6 hours as needed   loratadine (CLARITIN) 10 MG tablet Take 10 mg by mouth daily.   melatonin 3 MG TABS tablet Take 3 mg by mouth at bedtime.   metFORMIN (GLUCOPHAGE) 500 MG tablet Take 750 mg by mouth 2 (two) times daily with a meal.   metoprolol tartrate (LOPRESSOR) 50 MG tablet Take 50 mg by mouth 2 (two) times daily.   ondansetron (ZOFRAN) 4 MG tablet Take 4 mg by mouth every 4 (four) hours as needed for nausea or vomiting.   OXYGEN 2lpm as needed for sats less than 88% every 1 hour as needed   pantoprazole (PROTONIX) 40 MG tablet Take 40 mg by mouth daily.   PARoxetine (PAXIL) 30 MG tablet Take 30 mg by mouth daily.   Polyethyl Glycol-Propyl Glycol 0.4-0.3 % SOLN Apply to eye 2 (two) times daily as needed.   polyethylene glycol (MIRALAX / GLYCOLAX) 17 g packet Take 17 g by mouth daily as needed.    PRADAXA 150 MG CAPS capsule TAKE 1 CAPSULE BY MOUTH TWICE DAILY START TREATMENT 10 HRS LAST DOSE OF LOVENOX  SUNSCREEN SPF50 EX Apply topically. Apply to exposed skin topically every 2 hours as needed for Sunburn Prevention   tiZANidine (ZANAFLEX) 2 MG tablet Take 2 mg by mouth every 12 (twelve) hours as needed for muscle spasms.   VITAMIN D, ERGOCALCIFEROL, PO Take one tablet ( ) by mouth once daily.   Zinc Oxide (TRIPLE PASTE) 12.8 % ointment Apply to sacrum/buttocks topically every shift.   [DISCONTINUED] acetaminophen (TYLENOL) 500 MG tablet Take 500 mg by mouth every 6 (six) hours as needed.   No facility-administered encounter medications on file as of 02/09/2023.    Review of Systems  Immunization History  Administered Date(s) Administered   Influenza-Unspecified 02/03/2020, 02/18/2021, 02/18/2022   Moderna Covid-19 Fall Seasonal Vaccine 85yrs & older 08/12/2022   PNEUMOCOCCAL CONJUGATE-20 01/17/2023   Pneumococcal Conjugate-13 09/28/2017   Tdap 12/31/2022   Unspecified SARS-COV-2 Vaccination 05/20/2019, 05/20/2019, 06/17/2019, 03/16/2020, 09/20/2020, 01/25/2021, 01/30/2023   Pertinent  Health Maintenance Due  Topic Date Due   INFLUENZA VACCINE  12/04/2022   HEMOGLOBIN A1C  03/06/2023   OPHTHALMOLOGY EXAM  10/30/2023   FOOT EXAM  12/11/2023   DEXA SCAN  Completed      12/18/2021    7:45 PM 12/19/2021    7:00 AM 12/19/2021   12:00 PM 01/24/2022   11:20 AM 12/11/2022    1:31 PM  Fall Risk  Falls in the past year?     0  (RETIRED) Patient Fall Risk Level High fall risk High fall risk High fall risk High fall risk    Functional Status Survey:    Vitals:   02/09/23 1540 02/09/23 1548  BP: (!) 170/76 (!) 142/74  Pulse: 83   Resp: 20   Temp: (!) 97.5 F (36.4 C)   SpO2: 90%   Weight: 156 lb 6.4 oz (70.9 kg)   Height: 5\' 3"  (1.6 m)    Body mass index is 27.71 kg/m. Physical Exam Constitutional:      Appearance: She is obese.  Cardiovascular:     Rate and  Rhythm: Normal rate.     Pulses: Normal pulses.     Heart sounds: Normal heart sounds.  Pulmonary:     Effort: Pulmonary effort is normal.     Breath sounds: Normal breath sounds.  Abdominal:     General: Abdomen is flat. Bowel sounds are normal.  Musculoskeletal:     Comments: Left leg amputation. Right leg with compression in pace  Skin:    General: Skin is warm.  Neurological:     Mental Status: She is alert and oriented to person, place, and time.     Labs reviewed: Recent Labs    08/04/22 1033 08/11/22 0000 10/16/22 0000 02/03/23 1004  NA 139 142 139 139  K 4.0 4.7 4.3 4.0  CL 105 108 104 104  CO2 27 27* 26* 25  GLUCOSE 181*  --   --  174*  BUN 17 17 16 19   CREATININE 0.72 0.5 0.5 0.52  CALCIUM 10.8* 9.2 11.1* 11.2*   Recent Labs    03/05/22 0000 06/26/22 0000 10/16/22 0000  AST 18 19 18   ALT 10 13 13   ALKPHOS 65 58 55  ALBUMIN 4.0 3.8 4.1   Recent Labs    08/04/22 1033 12/01/22 0000 02/03/23 1004  WBC 8.9 7.8 8.6  NEUTROABS 6.3 4,360.00 5.5  HGB 13.4 13.6 13.4  HCT 43.0 41 42.3  MCV 88.5  --  92.0  PLT 229 197 174   Lab Results  Component Value Date  TSH 0.02 (A) 12/11/2022   Lab Results  Component Value Date   HGBA1C 7.1 09/03/2022   Lab Results  Component Value Date   CHOL 110 12/15/2021   HDL 19 (L) 12/15/2021   LDLCALC 46 12/15/2021   TRIG 223 (H) 12/15/2021   CHOLHDL 5.8 12/15/2021    Significant Diagnostic Results in last 30 days:  No results found.  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   Patient with history of left lower  extremity amputation.  Continues to require wheelchair and assistance for activities of daily life.  History of diabetes previously well-controlled continue glipizide 2.5 mg daily.  Continue metformin 750 mg twice daily.  Of note patient does not tolerate increased to 1000 mg twice daily for metformin.  Currently on Pradaxa without signs of bleeding.  Arthritic pain managed with topical diclofenac gel and gabapentin.  Mood well-controlled on Paxil 30 mg.  Will order labs to evaluate hyperparathyroidism, hypothyroidism as well as diabetes.  Rhinorrhea has been a chronic concern without signs of acute illness or viral infection.  Will plan to start Flonase twice daily x 7 days then daily follow-up to determine efficacy in 2 months.  If no improvement will discontinue and trial a different route for treatment.  Family/ staff Communication: nursing  Labs/tests ordered:  TSH, BMP, CBC, A1c

## 2023-02-16 DIAGNOSIS — E0500 Thyrotoxicosis with diffuse goiter without thyrotoxic crisis or storm: Secondary | ICD-10-CM | POA: Insufficient documentation

## 2023-02-16 DIAGNOSIS — E213 Hyperparathyroidism, unspecified: Secondary | ICD-10-CM

## 2023-02-16 HISTORY — DX: Hypercalcemia: E83.52

## 2023-02-16 HISTORY — DX: Thyrotoxicosis with diffuse goiter without thyrotoxic crisis or storm: E05.00

## 2023-02-16 HISTORY — DX: Hyperparathyroidism, unspecified: E21.3

## 2023-02-19 LAB — VAS US ABI WITH/WO TBI: Right ABI: 1.16

## 2023-02-22 ENCOUNTER — Telehealth: Payer: Self-pay | Admitting: Internal Medicine

## 2023-02-22 NOTE — Telephone Encounter (Signed)
Patient as pain in her Right leg below the knee Some swelling  Dopplers Ordered Cannot be done today Will Be done in AM Does not want to go to ED Low Suspicious for DVT Wills end to ED if worsening pain or swelling

## 2023-02-23 ENCOUNTER — Non-Acute Institutional Stay (SKILLED_NURSING_FACILITY): Payer: Medicare Other | Admitting: Student

## 2023-02-23 ENCOUNTER — Encounter: Payer: Self-pay | Admitting: Student

## 2023-02-23 DIAGNOSIS — M1711 Unilateral primary osteoarthritis, right knee: Secondary | ICD-10-CM | POA: Diagnosis not present

## 2023-02-23 NOTE — Progress Notes (Signed)
Location:  Other Nursing Home Room Number: [facility] 215A Place of Service:  SNF [phone]) Provider:  Ander Gaster, Benetta Spar, MD  Patient Care Team: Earnestine Mealing, MD as PCP - General (Family Medicine) Lemar Livings Merrily Pew, MD (General Surgery) Jeralyn Ruths, MD as Consulting Physician (Oncology)  Extended Emergency Contact Information Primary Emergency Contact: [name] Address: [address]          [place], [place] [address] [place] of [place] Home Phone: [phone] Mobile Phone: [phone] Relation: Daughter  Code Status:  DNR Goals of care: Advanced Directive information    02/03/2023   10:20 AM  Advanced Directives  Does Patient Have a Medical Advance Directive? Yes  Type of Estate agent of [place];Living will     Chief Complaint  Patient presents with   right leg pain    HPI:  Pt is a 89 y.o. female seen today for an acute visit for follow-up of right leg pain.  Weekend provider received phone call regarding patient with increased right lower extremity pain due to immobility a Doppler was ordered.  Patient states she has no pain at this time.  She declines any further medication to treat pain.  She understands that this is likely her underlying arthritis.  Past Medical History:  Diagnosis Date   Anxiety    Benign breast lumps    Blood clotting disorder (HCC)    Bone cancer (HCC)    head of femur, left leg ... 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  Oncotype recurrence score: 1.  DCIS, margin less than 0.5 mm.  Negative sentinel node.  Accelerated partial breast radiation.   Cervicalgia    Chronic kidney disease    kidney stones   Colon cancer [facility])    patient unaware of this   Complication of anesthesia    mood alteration / not sure if d/t pain medicine or anesthesia as she passed  out    Cough    NASAL DRIP / SNEEZING / SORE THROAT MOSTLY CONSTANT   Depression    Diabetes (HCC)    Diverticulitis    Dizzy    GERD (gastroesophageal reflux disease)    H/O blood clots    arm and leg   HBP (high blood pressure)    Hematuria    gross   Hemorrhoids    HLD (hyperlipidemia)    Microscopic hematuria 06/02/2015   Muscle pain    Osteoarthritis    Osteoporosis    Panic disorder    Personal history of radiation therapy    PND (post-nasal drip)    CHRONIC WITH SORE THROAT AND SNEEZING   Reflux    Sepsis (HCC)    Sepsis, unspecified organism (HCC) 12/15/2021   Formatting of this note might be different from the original.  Last Assessment & Plan: Formatting of this note might be different from the original. Appears to be multifactorial and secondary to healthcare associated pneumonia as well as a UTI CT angio shows asymmetric airspace disease at the left base and posteriorly in the right upper lobe concerning for pneumonia Patient also has pyuria Patient   Severe sepsis (HCC) 12/15/2021   Skin cancer    nose   Swelling    Tremor    Tremors of nervous system    Ureteral stone with hydronephrosis 11/26/2018   Past Surgical History:  Procedure Laterality Date   APPENDECTOMY  1962   BREAST BIOPSY Right 2006?   benign   BREAST BIOPSY Left 2019   invasive mammary carcinoma   BREAST CYST EXCISION Left 07/27/2017   Procedure: SKIN CYST EXCISED;  Surgeon: Earline Mayotte, MD;  Location: [facility] ORS;  Service: General;  Laterality: Left;   BREAST LUMPECTOMY Left 07/2017   invasive mammary, DCIS  mammosite   BREAST SURGERY Right 2019   CARPAL TUNNEL RELEASE Right    CATARACT EXTRACTION W/PHACO Left 11/11/2016   Procedure: CATARACT EXTRACTION PHACO AND INTRAOCULAR LENS PLACEMENT (IOC);  Surgeon: Galen Manila, MD;  Location: [facility] ORS;  Service: Ophthalmology;  Laterality: Left;  [place] 01:07.9 AP% 19.2 CDE 13.02 Fluid pack lot # [id number]   CATARACT EXTRACTION W/PHACO  Right 12/09/2016   Procedure: CATARACT EXTRACTION PHACO AND INTRAOCULAR LENS PLACEMENT (IOC);  Surgeon: Galen Manila, MD;  Location: [facility] ORS;  Service: Ophthalmology;  Laterality: Right;  [place] 00:49 AP% 21.2 CDE 10.39 Fluid pack lot # [id number]   CHOLECYSTECTOMY     COLONOSCOPY WITH PROPOFOL N/A 11/22/2014   Procedure: COLONOSCOPY WITH PROPOFOL;  Surgeon: Scot Jun, MD;  Location[facility] ENDOSCOPY;  Service: Endoscopy;  Laterality: N/A;   CYSTOSCOPY WITH STENT PLACEMENT Bilateral 11/27/2018   Procedure: CYSTOSCOPY WITH STENT PLACEMENT;  Surgeon: Jerilee Field, MD;  Location: [facility] ORS;  Service: Urology;  Laterality: Bilateral;   CYSTOSCOPY/URETEROSCOPY/HOLMIUM LASER/STENT PLACEMENT Bilateral 12/17/2018   Procedure: CYSTOSCOPY/URETEROSCOPY/HOLMIUM LASER/STENT Exchange;  Surgeon: Sondra Come, MD;  Location: [facility] ORS;  Service: Urology;  Laterality: Bilateral;   ESOPHAGOGASTRODUODENOSCOPY  11/22/2014   Procedure: ESOPHAGOGASTRODUODENOSCOPY (EGD);  Surgeon: Scot Jun, MD;  Location: [facility] ENDOSCOPY;  Service: Endoscopy;;   ESOPHAGOGASTRODUODENOSCOPY (EGD) WITH PROPOFOL N/A 03/05/2017   Procedure: ESOPHAGOGASTRODUODENOSCOPY (EGD) WITH PROPOFOL;  Surgeon: Toney Reil, MD;  Location: [facility] ENDOSCOPY;  Service: Gastroenterology;  Laterality: N/A;   EXTRACORPOREAL SHOCK WAVE LITHOTRIPSY Right 03/04/2018   Procedure: EXTRACORPOREAL SHOCK WAVE LITHOTRIPSY (ESWL);  Surgeon: Sondra Come, MD;  Location: [facility] ORS;  Service: Urology;  Laterality: Right;   EYE SURGERY Bilateral 2012   cataract extraction with iol   GALLBLADDER SURGERY  1968   hemi pelvectomy     left side at age 65   HEMIPELVIC Left 1962   LEG SURGERY Left    AMPUTATION d/t cancer at 89 years old   MASTECTOMY, PARTIAL Left 07/27/2017   Procedure: MASTECTOMY PARTIAL;  Surgeon: Earline Mayotte, MD;  Location: [facility] ORS;  Service: General;  Laterality: Left;   MOUTH SURGERY  2019   teeth pulled with a bridge  insertion.   fell out 12/16/18   OVARY SURGERY Right    cyst removed   SAVORY DILATION  11/22/2014   Procedure: SAVORY DILATION;  Surgeon: Scot Jun, MD;  Location: [facility] ENDOSCOPY;  Service: Endoscopy;;   SENTINEL NODE BIOPSY Left 07/27/2017   Procedure: SENTINEL NODE BIOPSY;  Surgeon: Earline Mayotte, MD;  Location: [facility] ORS;  Service: General;  Laterality: Left;   SKIN CANCER EXCISION     nose and arm and face   TEE WITHOUT CARDIOVERSION N/A 07/25/2019   Procedure: TRANSESOPHAGEAL ECHOCARDIOGRAM (TEE);  Surgeon: Lamar Blinks, MD;  Location: [facility] ORS;  Service: Cardiovascular;  Laterality: N/A;   TONSILLECTOMY      Allergies  Allergen Reactions   Ciprofloxacin Hives   Codeine Other (See Comments)    HALLUCINATIONS   Tape     Rash and skin irritation/ paper tape and tegaderm OK  Ciprocinonide [Fluocinolone] Other (See Comments)    unknown   Amoxicillin Other (See Comments)    Upset stomach Has patient had a PCN reaction causing immediate rash, facial/tongue/throat swelling, SOB or lightheadedness with hypotension: No Has patient had a PCN reaction causing severe rash involving mucus membranes or skin necrosis: No Has patient had a PCN reaction that required hospitalization: No Has patient had a PCN reaction occurring within the last 10 years: Yes If all of the above answers are "NO", then may proceed with Cephalosporin use.;   Cefuroxime Other (See Comments)    OK INTRACAMERALLY PER DR WLP, upset stomach   Latex Rash    Rast test NEGATIVE   Lipitor [Atorvastatin] Other (See Comments)    unknown    Outpatient Encounter Medications as of 02/23/2023  Medication Sig   methimazole (TAPAZOLE) 10 MG tablet Take 1 tablet by mouth daily.   nystatin cream (MYCOSTATIN) Apply topically.   acetaminophen (TYLENOL) 650 MG CR tablet Take 650 mg by mouth every 8 (eight) hours as needed for pain. Give one tablet by mouth three times daily.   Cholecalciferol (VITAMIN D-1000 MAX  ST) 25 MCG (1000 UT) tablet Take by mouth.   dextromethorphan-guaiFENesin (ROBITUSSIN-DM) 10-100 MG/5ML liquid Take 10 mLs by mouth every 4 (four) hours as needed for cough.   diclofenac Sodium (VOLTAREN) 1 % GEL Apply 4 g topically. Apply to stump topically every 6 hours as needed. Apply to right knee topically three times daily for pain.   fluconazole (DIFLUCAN) 150 MG tablet Take 150 mg by mouth once.   fluticasone (FLONASE) 50 MCG/ACT nasal spray Place 2 sprays into both nostrils 2 (two) times daily.   gabapentin (NEURONTIN) 100 MG capsule Take 100 mg by mouth 3 (three) times daily.   GLIPIZIDE XL 2.5 MG 24 hr tablet Take 2.5 mg by mouth daily.    hydrocortisone 2.5 % cream Apply to Hemorrhoids/buttucks topically every 8 hours as needed for hemorrhoids.   hydrocortisone cream 1 % Apply to rectum topically every 6 hours as needed   loratadine (CLARITIN) 10 MG tablet Take 10 mg by mouth daily.   melatonin 3 MG TABS tablet Take 3 mg by mouth at bedtime.   metFORMIN (GLUCOPHAGE) 500 MG tablet Take 750 mg by mouth 2 (two) times daily with a meal.   metoprolol tartrate (LOPRESSOR) 50 MG tablet Take 50 mg by mouth 2 (two) times daily.   ondansetron (ZOFRAN) 4 MG tablet Take 4 mg by mouth every 4 (four) hours as needed for nausea or vomiting.   OXYGEN 2lpm as needed for sats less than 88% every 1 hour as needed   pantoprazole (PROTONIX) 40 MG tablet Take 40 mg by mouth daily.   PARoxetine (PAXIL) 30 MG tablet Take 30 mg by mouth daily.   Polyethyl Glycol-Propyl Glycol 0.4-0.3 % SOLN Apply to eye 2 (two) times daily as needed.   polyethylene glycol (MIRALAX / GLYCOLAX) 17 g packet Take 17 g by mouth daily as needed.   PRADAXA 150 MG CAPS capsule TAKE 1 CAPSULE BY MOUTH TWICE DAILY START TREATMENT 10 HRS LAST DOSE OF LOVENOX   SUNSCREEN SPF50 EX Apply topically. Apply to exposed skin topically every 2 hours as needed for Sunburn Prevention   tiZANidine (ZANAFLEX) 2 MG tablet Take 2 mg by mouth every  12 (twelve) hours as needed for muscle spasms.   VITAMIN D, ERGOCALCIFEROL, PO Take one tablet ( ) by mouth once daily.   Zinc Oxide (TRIPLE PASTE) 12.8 % ointment Apply  to sacrum/buttocks topically every shift.   No facility-administered encounter medications on file as of 02/23/2023.    Review of Systems  Immunization History  Administered Date(s) Administered   Influenza-Unspecified 02/03/2020, 02/18/2021, 02/18/2022   Moderna Covid-19 Fall Seasonal Vaccine 23yrs & older 08/12/2022   PNEUMOCOCCAL CONJUGATE-20 01/17/2023   Pneumococcal Conjugate-13 09/28/2017   Tdap 12/31/2022   Unspecified SARS-COV-2 Vaccination 05/20/2019, 05/20/2019, 06/17/2019, 03/16/2020, 09/20/2020, 01/25/2021, 01/30/2023   Pertinent  Health Maintenance Due  Topic Date Due   INFLUENZA VACCINE  12/04/2022   HEMOGLOBIN A1C  03/06/2023   OPHTHALMOLOGY EXAM  10/30/2023   FOOT EXAM  12/11/2023   DEXA SCAN  Completed      12/18/2021    7:45 PM 12/19/2021    7:00 AM 12/19/2021   12:00 PM 01/24/2022   11:20 AM 12/11/2022    1:31 PM  Fall Risk  Falls in the past year?     0  (RETIRED) Patient Fall Risk Level High fall risk High fall risk High fall risk High fall risk    Functional Status Survey:    Vitals:   02/23/23 1417  BP: 138/78  Pulse: 80  Temp: (!) 97.4 F (36.3 C)  SpO2: 93%  Weight: 156 lb 6.4 oz (70.9 kg)   Body mass index is 27.71 kg/m. Physical Exam Constitutional:      Appearance: Normal appearance.  Musculoskeletal:     Comments: Right leg with out bruising, deformities, erythema, swelling or rash.   Neurological:     Mental Status: She is alert.     Labs reviewed: Recent Labs    08/04/22 1033 08/11/22 0000 10/16/22 0000 02/03/23 1004  NA 139 142 139 139  K 4.0 4.7 4.3 4.0  CL 105 108 104 104  CO2 27 27* 26* 25  GLUCOSE 181*  --   --  174*  BUN 17 17 16 19   CREATININE 0.72 0.5 0.5 0.52  CALCIUM 10.8* 9.2 11.1* 11.2*   Recent Labs    03/05/22 0000 06/26/22 0000  10/16/22 0000  AST 18 19 18   ALT 10 13 13   ALKPHOS 65 58 55  ALBUMIN 4.0 3.8 4.1   Recent Labs    08/04/22 1033 12/01/22 0000 02/03/23 1004  WBC 8.9 7.8 8.6  NEUTROABS 6.3 4,360.00 5.5  HGB 13.4 13.6 13.4  HCT 43.0 41 42.3  MCV 88.5  --  92.0  PLT 229 197 174   Lab Results  Component Value Date   TSH 0.02 (A) 12/11/2022   Lab Results  Component Value Date   HGBA1C 7.1 09/03/2022   Lab Results  Component Value Date   CHOL 110 12/15/2021   HDL 19 (L) 12/15/2021   LDLCALC 46 12/15/2021   TRIG 223 (H) 12/15/2021   CHOLHDL 5.8 12/15/2021    Significant Diagnostic Results in last 30 days:  No results found.  Assessment/Plan Osteoarthritis of right knee, unspecified osteoarthritis type Patient with longstanding history of osteoarthritis.  Receives diclofenac gel multiple times a day.  Offered additional pain management with stronger medication, patient declines at this time.  Doppler ultrasound from this weekend negative for acute DVT.  Continue to monitor and provide pain management with topical gel and Tylenol  Family/ staff Communication: nursing  Labs/tests ordered:  none

## 2023-02-24 ENCOUNTER — Encounter: Payer: Self-pay | Admitting: Nurse Practitioner

## 2023-02-24 ENCOUNTER — Non-Acute Institutional Stay (SKILLED_NURSING_FACILITY): Payer: Medicare Other | Admitting: Nurse Practitioner

## 2023-02-24 DIAGNOSIS — R053 Chronic cough: Secondary | ICD-10-CM | POA: Diagnosis not present

## 2023-02-24 DIAGNOSIS — M1711 Unilateral primary osteoarthritis, right knee: Secondary | ICD-10-CM | POA: Diagnosis not present

## 2023-02-24 DIAGNOSIS — R410 Disorientation, unspecified: Secondary | ICD-10-CM

## 2023-02-24 NOTE — Progress Notes (Signed)
Location:  Other [place].  Nursing Home Room Number: [facility] [address] Place of Service:  SNF [phone]) Abbey Chatters, NP  PCP: Earnestine Mealing, MD  Patient Care Team: Earnestine Mealing, MD as PCP - General (Family Medicine) Lemar Livings, Merrily Pew, MD (General Surgery) Jeralyn Ruths, MD as Consulting Physician (Oncology)  Extended Emergency Contact Information Primary Emergency Contact: [name] Address: [address]          [place], [place] [address] [place] Home Phone: [phone] Mobile Phone: [phone] Relation: Daughter  Goals of care: Advanced Directive information    02/24/2023   11:45 AM  Advanced Directives  Does Patient Have a Medical Advance Directive? Yes  Type of Advance Directive Out of facility DNR (pink MOST or yellow form)  Does patient want to make changes to medical advance directive? No - Patient declined     Chief Complaint  Patient presents with   Acute Visit    Increased Confusion.     HPI:  Pt is a 89 y.o. female seen today for an acute visit for confusion.  Reports she was miserable yesterday due to pain.  Today is better.  Reports bp was high yesterday and she stayed in bed most of the day.  Knee hurts today but better   Staff reports increase confusion in the last 4 days, off behaviors like pulling items out of drawers, spinning in circles in her wheelchair and making off comments.   Reports she has a tickle in her throat that has been ongoing.  Reports chronic cough.  Saw ENT in the past and has not offered any resolution to symptoms.   A & O x4 today.   Reports she is never 100% right, occasional headache or vision issues- seeing eye doctor for this.   Reports she is in her usual state of health today      Past Medical History:  Diagnosis Date   Anxiety    Benign breast lumps    Blood clotting disorder (HCC)    Bone cancer (HCC)    head of femur, left leg ... 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  Oncotype recurrence score: 1.  DCIS, margin less than 0.5 mm.  Negative sentinel node.  Accelerated partial breast radiation.   Cervicalgia    Chronic kidney disease    kidney stones   Colon cancer [facility])    patient unaware of this   Complication of anesthesia    mood alteration / not sure if d/t pain medicine or anesthesia as she passed out    Cough    NASAL DRIP / SNEEZING / SORE THROAT MOSTLY CONSTANT   Depression    Diabetes (HCC)    Diverticulitis    Dizzy    GERD (gastroesophageal reflux disease)    H/O blood clots    arm and leg   HBP (high blood pressure)    Hematuria    gross   Hemorrhoids    HLD (hyperlipidemia)    Microscopic hematuria 06/02/2015   Muscle pain    Osteoarthritis    Osteoporosis    Panic disorder    Personal history of radiation therapy    PND (post-nasal drip)    CHRONIC WITH SORE THROAT AND SNEEZING   Reflux    Sepsis (HCC)    Sepsis, unspecified organism (HCC) 12/15/2021   Formatting  of this note might be different from the original.  Last Assessment & Plan: Formatting of this note might be different from the original. Appears to be multifactorial and secondary to healthcare associated pneumonia as well as a UTI CT angio shows asymmetric airspace disease at the left base and posteriorly in the right upper lobe concerning for pneumonia Patient also has pyuria Patient   Severe sepsis (HCC) 12/15/2021   Skin cancer    nose   Swelling    Tremor    Tremors of nervous system    Ureteral stone with hydronephrosis 11/26/2018   Past Surgical History:  Procedure Laterality Date   APPENDECTOMY  1962   BREAST BIOPSY Right 2006?   benign   BREAST BIOPSY Left 2019   invasive mammary carcinoma   BREAST CYST EXCISION Left 07/27/2017   Procedure: SKIN CYST EXCISED;  Surgeon: Earline Mayotte, MD;  Location: [facility] ORS;   Service: General;  Laterality: Left;   BREAST LUMPECTOMY Left 07/2017   invasive mammary, DCIS  mammosite   BREAST SURGERY Right 2019   CARPAL TUNNEL RELEASE Right    CATARACT EXTRACTION W/PHACO Left 11/11/2016   Procedure: CATARACT EXTRACTION PHACO AND INTRAOCULAR LENS PLACEMENT (IOC);  Surgeon: Galen Manila, MD;  Location: [facility] ORS;  Service: Ophthalmology;  Laterality: Left;  [place] 01:07.9 AP% 19.2 CDE 13.02 Fluid pack lot # [id number]   CATARACT EXTRACTION W/PHACO Right 12/09/2016   Procedure: CATARACT EXTRACTION PHACO AND INTRAOCULAR LENS PLACEMENT (IOC);  Surgeon: Galen Manila, MD;  Location: [facility] ORS;  Service: Ophthalmology;  Laterality: Right;  [place] 00:49 AP% 21.2 CDE 10.39 Fluid pack lot # [id number]   CHOLECYSTECTOMY     COLONOSCOPY WITH PROPOFOL N/A 11/22/2014   Procedure: COLONOSCOPY WITH PROPOFOL;  Surgeon: Scot Jun, MD;  Location: [facility] ENDOSCOPY;  Service: Endoscopy;  Laterality: N/A;   CYSTOSCOPY WITH STENT PLACEMENT Bilateral 11/27/2018   Procedure: CYSTOSCOPY WITH STENT PLACEMENT;  Surgeon: Jerilee Field, MD;  Location: [facility] ORS;  Service: Urology;  Laterality: Bilateral;   CYSTOSCOPY/URETEROSCOPY/HOLMIUM LASER/STENT PLACEMENT Bilateral 12/17/2018   Procedure: CYSTOSCOPY/URETEROSCOPY/HOLMIUM LASER/STENT Exchange;  Surgeon: Sondra Come, MD;  Location: [facility] ORS;  Service: Urology;  Laterality: Bilateral;   ESOPHAGOGASTRODUODENOSCOPY  11/22/2014   Procedure: ESOPHAGOGASTRODUODENOSCOPY (EGD);  Surgeon: Scot Jun, MD;  Location: [facility] ENDOSCOPY;  Service: Endoscopy;;   ESOPHAGOGASTRODUODENOSCOPY (EGD) WITH PROPOFOL N/A 03/05/2017   Procedure: ESOPHAGOGASTRODUODENOSCOPY (EGD) WITH PROPOFOL;  Surgeon: Toney Reil, MD;  Location: [facility] ENDOSCOPY;  Service: Gastroenterology;  Laterality: N/A;   EXTRACORPOREAL SHOCK WAVE LITHOTRIPSY Right 03/04/2018   Procedure: EXTRACORPOREAL SHOCK WAVE LITHOTRIPSY (ESWL);  Surgeon: Sondra Come, MD;  Location: [facility] ORS;   Service: Urology;  Laterality: Right;   EYE SURGERY Bilateral 2012   cataract extraction with iol   GALLBLADDER SURGERY  1968   hemi pelvectomy     left side at age 20   HEMIPELVIC Left 1962   LEG SURGERY Left    AMPUTATION d/t cancer at 89 years old   MASTECTOMY, PARTIAL Left 07/27/2017   Procedure: MASTECTOMY PARTIAL;  Surgeon: Earline Mayotte, MD;  Location: [facility] ORS;  Service: General;  Laterality: Left;   MOUTH SURGERY  2019   teeth pulled with a bridge insertion.   fell out 12/16/18   OVARY SURGERY Right    cyst removed   SAVORY DILATION  11/22/2014   Procedure: SAVORY DILATION;  Surgeon: Scot Jun, MD;  Location: [facility] ENDOSCOPY;  Service: Endoscopy;;   SENTINEL NODE BIOPSY Left 07/27/2017  Procedure: SENTINEL NODE BIOPSY;  Surgeon: Earline Mayotte, MD;  Location: [facility] ORS;  Service: General;  Laterality: Left;   SKIN CANCER EXCISION     nose and arm and face   TEE WITHOUT CARDIOVERSION N/A 07/25/2019   Procedure: TRANSESOPHAGEAL ECHOCARDIOGRAM (TEE);  Surgeon: Lamar Blinks, MD;  Location: [facility] ORS;  Service: Cardiovascular;  Laterality: N/A;   TONSILLECTOMY      Allergies  Allergen Reactions   Ciprofloxacin Hives   Codeine Other (See Comments)    HALLUCINATIONS   Tape     Rash and skin irritation/ paper tape and tegaderm OK   Ciprocinonide [Fluocinolone] Other (See Comments)    unknown   Amoxicillin Other (See Comments)    Upset stomach Has patient had a PCN reaction causing immediate rash, facial/tongue/throat swelling, SOB or lightheadedness with hypotension: No Has patient had a PCN reaction causing severe rash involving mucus membranes or skin necrosis: No Has patient had a PCN reaction that required hospitalization: No Has patient had a PCN reaction occurring within the last 10 years: Yes If all of the above answers are "NO", then may proceed with Cephalosporin use.;   Cefuroxime Other (See Comments)    OK INTRACAMERALLY PER DR WLP, upset stomach    Latex Rash    Rast test NEGATIVE   Lipitor [Atorvastatin] Other (See Comments)    unknown    Outpatient Encounter Medications as of 02/24/2023  Medication Sig   acetaminophen (TYLENOL) 650 MG CR tablet Take 650 mg by mouth every 8 (eight) hours as needed for pain. Give one tablet by mouth three times daily.   Cholecalciferol (VITAMIN D-1000 MAX ST) 25 MCG (1000 UT) tablet Take by mouth.   dextromethorphan-guaiFENesin (ROBITUSSIN-DM) 10-100 MG/5ML liquid Take 10 mLs by mouth every 4 (four) hours as needed for cough.   diclofenac Sodium (VOLTAREN) 1 % GEL Apply 4 g topically. Apply to stump topically every 6 hours as needed. Apply to right knee topically three times daily for pain.   fluticasone (FLONASE) 50 MCG/ACT nasal spray Place 2 sprays into both nostrils 2 (two) times daily.   gabapentin (NEURONTIN) 100 MG capsule Take 100 mg by mouth 3 (three) times daily.   GLIPIZIDE XL 2.5 MG 24 hr tablet Take 2.5 mg by mouth daily.    hydrocortisone 2.5 % cream Apply to Hemorrhoids/buttucks topically every 8 hours as needed for hemorrhoids.   hydrocortisone cream 1 % Apply to rectum topically every 6 hours as needed   loratadine (CLARITIN) 10 MG tablet Take 10 mg by mouth daily.   melatonin 3 MG TABS tablet Take 3 mg by mouth at bedtime.   metFORMIN (GLUCOPHAGE) 500 MG tablet Take 750 mg by mouth 2 (two) times daily with a meal.   methimazole (TAPAZOLE) 10 MG tablet Take 1 tablet by mouth daily.   metoprolol tartrate (LOPRESSOR) 50 MG tablet Take 50 mg by mouth 2 (two) times daily.   nystatin cream (MYCOSTATIN) Apply topically.   ondansetron (ZOFRAN) 4 MG tablet Take 4 mg by mouth every 4 (four) hours as needed for nausea or vomiting.   OXYGEN 2lpm as needed for sats less than 88% every 1 hour as needed   pantoprazole (PROTONIX) 40 MG tablet Take 40 mg by mouth daily.   PARoxetine (PAXIL) 30 MG tablet Take 30 mg by mouth daily.   Polyethyl Glycol-Propyl Glycol 0.4-0.3 % SOLN Apply to eye 2  (two) times daily as needed.   polyethylene glycol (MIRALAX / GLYCOLAX) 17 g packet Take  17 g by mouth daily as needed.   PRADAXA 150 MG CAPS capsule TAKE 1 CAPSULE BY MOUTH TWICE DAILY START TREATMENT 10 HRS LAST DOSE OF LOVENOX   SUNSCREEN SPF50 EX Apply topically. Apply to exposed skin topically every 2 hours as needed for Sunburn Prevention   tiZANidine (ZANAFLEX) 2 MG tablet Take 2 mg by mouth every 12 (twelve) hours as needed for muscle spasms.   VITAMIN D, ERGOCALCIFEROL, PO Take one tablet ( ) by mouth once daily.   Zinc Oxide (TRIPLE PASTE) 12.8 % ointment Apply to sacrum/buttocks topically every shift.   fluconazole (DIFLUCAN) 150 MG tablet Take 150 mg by mouth once.   No facility-administered encounter medications on file as of 02/24/2023.    Review of Systems  Constitutional:  Negative for activity change, appetite change, fatigue and unexpected weight change.  HENT:  Negative for congestion and hearing loss.   Eyes: Negative.   Respiratory:  Positive for cough. Negative for shortness of breath.   Cardiovascular:  Negative for chest pain, palpitations and leg swelling.  Gastrointestinal:  Negative for abdominal pain, constipation and diarrhea.  Genitourinary:  Negative for difficulty urinating and dysuria.  Musculoskeletal:  Positive for arthralgias, gait problem and myalgias.  Skin:  Negative for color change and wound.  Neurological:  Negative for dizziness and weakness.  Psychiatric/Behavioral:  Negative for agitation, behavioral problems and confusion.     Immunization History  Administered Date(s) Administered   Influenza-Unspecified 02/03/2020, 02/18/2021, 02/18/2022   Moderna Covid-19 Fall Seasonal Vaccine 64yrs & older 08/12/2022   PNEUMOCOCCAL CONJUGATE-20 01/17/2023   Pneumococcal Conjugate-13 09/28/2017   Tdap 12/31/2022   Unspecified SARS-COV-2 Vaccination 05/20/2019, 05/20/2019, 06/17/2019, 03/16/2020, 09/20/2020, 01/25/2021, 01/30/2023   Pertinent   Health Maintenance Due  Topic Date Due   INFLUENZA VACCINE  12/04/2022   HEMOGLOBIN A1C  03/06/2023   OPHTHALMOLOGY EXAM  10/30/2023   FOOT EXAM  12/11/2023   DEXA SCAN  Completed      12/18/2021    7:45 PM 12/19/2021    7:00 AM 12/19/2021   12:00 PM 01/24/2022   11:20 AM 12/11/2022    1:31 PM  Fall Risk  Falls in the past year?     0  (RETIRED) Patient Fall Risk Level High fall risk High fall risk High fall risk High fall risk    Functional Status Survey:    Vitals:   02/24/23 1137  BP: 138/78  Pulse: 80  Resp: 20  Temp: (!) 97.4 F (36.3 C)  SpO2: (!) 88%  Weight: 156 lb 6.4 oz (70.9 kg)  Height: 5\' 3"  (1.6 m)   Body mass index is 27.71 kg/m. Physical Exam Constitutional:      General: She is not in acute distress.    Appearance: She is well-developed. She is not diaphoretic.  HENT:     Head: Normocephalic and atraumatic.     Mouth/Throat:     Pharynx: No oropharyngeal exudate.  Eyes:     Conjunctiva/sclera: Conjunctivae normal.     Pupils: Pupils are equal, round, and reactive to light.  Cardiovascular:     Rate and Rhythm: Normal rate and regular rhythm.     Heart sounds: Normal heart sounds.  Pulmonary:     Effort: Pulmonary effort is normal.     Breath sounds: Rhonchi (slight abnormality noted to left lower lobe) present.  Abdominal:     General: Bowel sounds are normal.     Palpations: Abdomen is soft.  Musculoskeletal:     Cervical back: Normal  range of motion and neck supple.     Right lower leg: No edema.  Skin:    General: Skin is warm and dry.  Neurological:     Mental Status: She is alert and oriented to person, place, and time.     Motor: No weakness.     Gait: Gait abnormal (due to left leg amputation).  Psychiatric:        Mood and Affect: Mood normal.     Labs reviewed: Recent Labs    08/04/22 1033 08/11/22 0000 10/16/22 0000 02/03/23 1004  NA 139 142 139 139  K 4.0 4.7 4.3 4.0  CL 105 108 104 104  CO2 27 27* 26* 25  GLUCOSE  181*  --   --  174*  BUN 17 17 16 19   CREATININE 0.72 0.5 0.5 0.52  CALCIUM 10.8* 9.2 11.1* 11.2*   Recent Labs    03/05/22 0000 06/26/22 0000 10/16/22 0000  AST 18 19 18   ALT 10 13 13   ALKPHOS 65 58 55  ALBUMIN 4.0 3.8 4.1   Recent Labs    08/04/22 1033 12/01/22 0000 02/03/23 1004  WBC 8.9 7.8 8.6  NEUTROABS 6.3 4,360.00 5.5  HGB 13.4 13.6 13.4  HCT 43.0 41 42.3  MCV 88.5  --  92.0  PLT 229 197 174   Lab Results  Component Value Date   TSH 0.02 (A) 12/11/2022   Lab Results  Component Value Date   HGBA1C 7.1 09/03/2022   Lab Results  Component Value Date   CHOL 110 12/15/2021   HDL 19 (L) 12/15/2021   LDLCALC 46 12/15/2021   TRIG 223 (H) 12/15/2021   CHOLHDL 5.8 12/15/2021    Significant Diagnostic Results in last 30 days:  No results found.  Assessment/Plan 1. Confusion Noted by staff, conversation today was appropriate without acute confusion or complaints. Will get cbc, cmp to rule out electrolyte disturbance or changes in blood counts.   2. Chronic cough Staff reports slightly worsening cough and clearing throat. Due to increase in confusion noted will get chest xray to rule out pneumonia.   3. Osteoarthritis of right knee, unspecified osteoarthritis type Improved today.  Continue tylenol with Voltaren gel.  Janene Harvey. Biagio Borg [facility] [phone]

## 2023-03-08 ENCOUNTER — Telehealth: Payer: Self-pay | Admitting: Family

## 2023-03-08 NOTE — Telephone Encounter (Signed)
[facility] facility nurse call reports patient urine culture results indicates mixed flora. Patient recently completed antibiotics for pneumonia but remains confused.Vital signs stable. Orders given for In and out cath to obtain urine specimen for culture and sensitivity. Advised to notify provider if patient running any fever or symptoms worsen.  Please follow-up.

## 2023-03-09 LAB — CBC: RBC: 4.19 (ref 3.87–5.11)

## 2023-03-09 LAB — BASIC METABOLIC PANEL
BUN: 10 (ref 4–21)
CO2: 29 — AB (ref 13–22)
Chloride: 107 (ref 99–108)
Creatinine: 0.4 — AB (ref 0.5–1.1)
Glucose: 130
Potassium: 4.1 meq/L (ref 3.5–5.1)
Sodium: 143 (ref 137–147)

## 2023-03-09 LAB — CBC AND DIFFERENTIAL
HCT: 37 (ref 36–46)
Hemoglobin: 11.8 — AB (ref 12.0–16.0)
Neutrophils Absolute: 4984
Platelets: 180 10*3/uL (ref 150–400)
WBC: 7.8

## 2023-03-09 LAB — COMPREHENSIVE METABOLIC PANEL
Albumin: 3.3 — AB (ref 3.5–5.0)
Calcium: 9.8 (ref 8.7–10.7)
Globulin: 2.1

## 2023-03-09 LAB — TSH: TSH: 0.07 — AB (ref 0.41–5.90)

## 2023-03-09 LAB — HEPATIC FUNCTION PANEL
ALT: 5 U/L — AB (ref 7–35)
AST: 12 — AB (ref 13–35)
Alkaline Phosphatase: 47 (ref 25–125)
Bilirubin, Total: 0.3

## 2023-03-09 LAB — HEMOGLOBIN A1C: Hemoglobin A1C: 6.8

## 2023-03-19 ENCOUNTER — Other Ambulatory Visit: Payer: Self-pay

## 2023-03-19 ENCOUNTER — Emergency Department: Payer: Medicare Other

## 2023-03-19 ENCOUNTER — Inpatient Hospital Stay
Admission: EM | Admit: 2023-03-19 | Discharge: 2023-03-30 | DRG: 871 | Disposition: A | Discharge status: Skilled Nursing Facility | Payer: Medicare Other | Source: Skilled Nursing Facility | Attending: Student | Admitting: Student

## 2023-03-19 DIAGNOSIS — I4510 Unspecified right bundle-branch block: Secondary | ICD-10-CM | POA: Diagnosis present

## 2023-03-19 DIAGNOSIS — Z885 Allergy status to narcotic agent status: Secondary | ICD-10-CM

## 2023-03-19 DIAGNOSIS — R441 Visual hallucinations: Secondary | ICD-10-CM | POA: Diagnosis present

## 2023-03-19 DIAGNOSIS — Z923 Personal history of irradiation: Secondary | ICD-10-CM

## 2023-03-19 DIAGNOSIS — F32A Depression, unspecified: Secondary | ICD-10-CM | POA: Diagnosis present

## 2023-03-19 DIAGNOSIS — J189 Pneumonia, unspecified organism: Secondary | ICD-10-CM | POA: Diagnosis present

## 2023-03-19 DIAGNOSIS — Z8041 Family history of malignant neoplasm of ovary: Secondary | ICD-10-CM

## 2023-03-19 DIAGNOSIS — J9601 Acute respiratory failure with hypoxia: Secondary | ICD-10-CM

## 2023-03-19 DIAGNOSIS — R0902 Hypoxemia: Secondary | ICD-10-CM | POA: Diagnosis present

## 2023-03-19 DIAGNOSIS — Z91040 Latex allergy status: Secondary | ICD-10-CM

## 2023-03-19 DIAGNOSIS — F410 Panic disorder [episodic paroxysmal anxiety] without agoraphobia: Secondary | ICD-10-CM | POA: Diagnosis present

## 2023-03-19 DIAGNOSIS — Z8042 Family history of malignant neoplasm of prostate: Secondary | ICD-10-CM

## 2023-03-19 DIAGNOSIS — Z9012 Acquired absence of left breast and nipple: Secondary | ICD-10-CM

## 2023-03-19 DIAGNOSIS — I129 Hypertensive chronic kidney disease with stage 1 through stage 4 chronic kidney disease, or unspecified chronic kidney disease: Secondary | ICD-10-CM | POA: Diagnosis present

## 2023-03-19 DIAGNOSIS — R0603 Acute respiratory distress: Secondary | ICD-10-CM | POA: Diagnosis present

## 2023-03-19 DIAGNOSIS — E872 Acidosis, unspecified: Secondary | ICD-10-CM | POA: Diagnosis present

## 2023-03-19 DIAGNOSIS — I10 Essential (primary) hypertension: Secondary | ICD-10-CM | POA: Insufficient documentation

## 2023-03-19 DIAGNOSIS — Z85828 Personal history of other malignant neoplasm of skin: Secondary | ICD-10-CM

## 2023-03-19 DIAGNOSIS — Z1152 Encounter for screening for COVID-19: Secondary | ICD-10-CM

## 2023-03-19 DIAGNOSIS — E878 Other disorders of electrolyte and fluid balance, not elsewhere classified: Secondary | ICD-10-CM | POA: Diagnosis present

## 2023-03-19 DIAGNOSIS — R4182 Altered mental status, unspecified: Principal | ICD-10-CM

## 2023-03-19 DIAGNOSIS — Z9841 Cataract extraction status, right eye: Secondary | ICD-10-CM

## 2023-03-19 DIAGNOSIS — R4781 Slurred speech: Secondary | ICD-10-CM | POA: Diagnosis present

## 2023-03-19 DIAGNOSIS — A419 Sepsis, unspecified organism: Principal | ICD-10-CM | POA: Diagnosis present

## 2023-03-19 DIAGNOSIS — E785 Hyperlipidemia, unspecified: Secondary | ICD-10-CM | POA: Diagnosis present

## 2023-03-19 DIAGNOSIS — N390 Urinary tract infection, site not specified: Secondary | ICD-10-CM

## 2023-03-19 DIAGNOSIS — D6851 Activated protein C resistance: Secondary | ICD-10-CM | POA: Diagnosis present

## 2023-03-19 DIAGNOSIS — E876 Hypokalemia: Secondary | ICD-10-CM | POA: Diagnosis present

## 2023-03-19 DIAGNOSIS — Z9049 Acquired absence of other specified parts of digestive tract: Secondary | ICD-10-CM

## 2023-03-19 DIAGNOSIS — Z961 Presence of intraocular lens: Secondary | ICD-10-CM | POA: Diagnosis present

## 2023-03-19 DIAGNOSIS — E0590 Thyrotoxicosis, unspecified without thyrotoxic crisis or storm: Secondary | ICD-10-CM | POA: Diagnosis present

## 2023-03-19 DIAGNOSIS — R6520 Severe sepsis without septic shock: Secondary | ICD-10-CM | POA: Diagnosis present

## 2023-03-19 DIAGNOSIS — I440 Atrioventricular block, first degree: Secondary | ICD-10-CM | POA: Diagnosis present

## 2023-03-19 DIAGNOSIS — Z7902 Long term (current) use of antithrombotics/antiplatelets: Secondary | ICD-10-CM

## 2023-03-19 DIAGNOSIS — Z803 Family history of malignant neoplasm of breast: Secondary | ICD-10-CM

## 2023-03-19 DIAGNOSIS — Z9842 Cataract extraction status, left eye: Secondary | ICD-10-CM

## 2023-03-19 DIAGNOSIS — Z79899 Other long term (current) drug therapy: Secondary | ICD-10-CM

## 2023-03-19 DIAGNOSIS — E8770 Fluid overload, unspecified: Secondary | ICD-10-CM | POA: Diagnosis present

## 2023-03-19 DIAGNOSIS — R6889 Other general symptoms and signs: Secondary | ICD-10-CM | POA: Diagnosis present

## 2023-03-19 DIAGNOSIS — G4700 Insomnia, unspecified: Secondary | ICD-10-CM | POA: Diagnosis present

## 2023-03-19 DIAGNOSIS — G9341 Metabolic encephalopathy: Secondary | ICD-10-CM

## 2023-03-19 DIAGNOSIS — Z823 Family history of stroke: Secondary | ICD-10-CM

## 2023-03-19 DIAGNOSIS — Z87442 Personal history of urinary calculi: Secondary | ICD-10-CM

## 2023-03-19 DIAGNOSIS — Z880 Allergy status to penicillin: Secondary | ICD-10-CM

## 2023-03-19 DIAGNOSIS — Z89612 Acquired absence of left leg above knee: Secondary | ICD-10-CM

## 2023-03-19 DIAGNOSIS — Z853 Personal history of malignant neoplasm of breast: Secondary | ICD-10-CM

## 2023-03-19 DIAGNOSIS — Z9981 Dependence on supplemental oxygen: Secondary | ICD-10-CM

## 2023-03-19 DIAGNOSIS — Z85830 Personal history of malignant neoplasm of bone: Secondary | ICD-10-CM

## 2023-03-19 DIAGNOSIS — R7989 Other specified abnormal findings of blood chemistry: Secondary | ICD-10-CM | POA: Diagnosis present

## 2023-03-19 DIAGNOSIS — Z888 Allergy status to other drugs, medicaments and biological substances status: Secondary | ICD-10-CM

## 2023-03-19 DIAGNOSIS — E1122 Type 2 diabetes mellitus with diabetic chronic kidney disease: Secondary | ICD-10-CM | POA: Diagnosis present

## 2023-03-19 DIAGNOSIS — K219 Gastro-esophageal reflux disease without esophagitis: Secondary | ICD-10-CM | POA: Diagnosis present

## 2023-03-19 DIAGNOSIS — N200 Calculus of kidney: Secondary | ICD-10-CM | POA: Diagnosis present

## 2023-03-19 DIAGNOSIS — Z7984 Long term (current) use of oral hypoglycemic drugs: Secondary | ICD-10-CM

## 2023-03-19 DIAGNOSIS — Z66 Do not resuscitate: Secondary | ICD-10-CM | POA: Diagnosis present

## 2023-03-19 DIAGNOSIS — E1142 Type 2 diabetes mellitus with diabetic polyneuropathy: Secondary | ICD-10-CM | POA: Insufficient documentation

## 2023-03-19 DIAGNOSIS — Z993 Dependence on wheelchair: Secondary | ICD-10-CM

## 2023-03-19 DIAGNOSIS — Z881 Allergy status to other antibiotic agents status: Secondary | ICD-10-CM

## 2023-03-19 LAB — CBC WITH DIFFERENTIAL/PLATELET
Abs Immature Granulocytes: 0.09 10*3/uL — ABNORMAL HIGH (ref 0.00–0.07)
Basophils Absolute: 0.1 10*3/uL (ref 0.0–0.1)
Basophils Relative: 1 %
Eosinophils Absolute: 0.1 10*3/uL (ref 0.0–0.5)
Eosinophils Relative: 1 %
HCT: 40.5 % (ref 36.0–46.0)
Hemoglobin: 12.6 g/dL (ref 12.0–15.0)
Immature Granulocytes: 1 %
Lymphocytes Relative: 15 %
Lymphs Abs: 1.9 10*3/uL (ref 0.7–4.0)
MCH: 28.4 pg (ref 26.0–34.0)
MCHC: 31.1 g/dL (ref 30.0–36.0)
MCV: 91.4 fL (ref 80.0–100.0)
Monocytes Absolute: 1.1 10*3/uL — ABNORMAL HIGH (ref 0.1–1.0)
Monocytes Relative: 8 %
Neutro Abs: 9.8 10*3/uL — ABNORMAL HIGH (ref 1.7–7.7)
Neutrophils Relative %: 74 %
Platelets: 244 10*3/uL (ref 150–400)
RBC: 4.43 MIL/uL (ref 3.87–5.11)
RDW: 14.4 % (ref 11.5–15.5)
WBC: 13.1 10*3/uL — ABNORMAL HIGH (ref 4.0–10.5)
nRBC: 0 % (ref 0.0–0.2)

## 2023-03-19 LAB — URINALYSIS, W/ REFLEX TO CULTURE (INFECTION SUSPECTED)
Bacteria, UA: NONE SEEN
Bilirubin Urine: NEGATIVE
Glucose, UA: NEGATIVE mg/dL
Ketones, ur: 5 mg/dL — AB
Leukocytes,Ua: NEGATIVE
Nitrite: NEGATIVE
Protein, ur: 30 mg/dL — AB
RBC / HPF: >50 RBC/hpf (ref 0–5)
Specific Gravity, Urine: 1.009 (ref 1.005–1.030)
Squamous Epithelial / HPF: 0 /[HPF] (ref 0–5)
pH: 6 (ref 5.0–8.0)

## 2023-03-19 LAB — BLOOD GAS, VENOUS
Acid-Base Excess: 3.3 mmol/L — ABNORMAL HIGH (ref 0.0–2.0)
Bicarbonate: 27.8 mmol/L (ref 20.0–28.0)
O2 Saturation: 89.3 %
Patient temperature: 37
pCO2, Ven: 41 mm[Hg] — ABNORMAL LOW (ref 44–60)
pH, Ven: 7.44 — ABNORMAL HIGH (ref 7.25–7.43)
pO2, Ven: 59 mm[Hg] — ABNORMAL HIGH (ref 32–45)

## 2023-03-19 LAB — BRAIN NATRIURETIC PEPTIDE: B Natriuretic Peptide: 230.1 pg/mL — ABNORMAL HIGH (ref 0.0–100.0)

## 2023-03-19 LAB — COMPREHENSIVE METABOLIC PANEL
ALT: 11 U/L (ref 0–44)
AST: 24 U/L (ref 15–41)
Albumin: 3.4 g/dL — ABNORMAL LOW (ref 3.5–5.0)
Alkaline Phosphatase: 59 U/L (ref 38–126)
Anion gap: 9 (ref 5–15)
BUN: 15 mg/dL (ref 8–23)
CO2: 26 mmol/L (ref 22–32)
Calcium: 9.7 mg/dL (ref 8.9–10.3)
Chloride: 101 mmol/L (ref 98–111)
Creatinine, Ser: 0.59 mg/dL (ref 0.44–1.00)
GFR, Estimated: >60 mL/min (ref >60)
Glucose, Bld: 135 mg/dL — ABNORMAL HIGH (ref 70–99)
Potassium: 4.4 mmol/L (ref 3.5–5.1)
Sodium: 136 mmol/L (ref 135–145)
Total Bilirubin: 1.2 mg/dL — ABNORMAL HIGH (ref <1.2)
Total Protein: 6.9 g/dL (ref 6.5–8.1)

## 2023-03-19 LAB — LACTIC ACID, PLASMA
Lactic Acid, Venous: 2.1 mmol/L (ref 0.5–1.9)
Lactic Acid, Venous: 2.1 mmol/L (ref 0.5–1.9)

## 2023-03-19 LAB — PROTIME-INR
INR: 1.1 (ref 0.8–1.2)
Prothrombin Time: 14.2 s (ref 11.4–15.2)

## 2023-03-19 MED ORDER — LORAZEPAM 2 MG/ML IJ SOLN
1.0000 mg | Freq: Once | INTRAMUSCULAR | Status: AC
  Administered 2023-03-19: 1 mg via INTRAVENOUS
  Filled 2023-03-19: qty 1

## 2023-03-19 MED ORDER — HALOPERIDOL LACTATE 5 MG/ML IJ SOLN
2.0000 mg | Freq: Once | INTRAMUSCULAR | Status: AC
  Administered 2023-03-19: 2 mg via INTRAVENOUS
  Filled 2023-03-19: qty 1

## 2023-03-19 MED ORDER — IOHEXOL 350 MG/ML SOLN
100.0000 mL | Freq: Once | INTRAVENOUS | Status: AC | PRN
  Administered 2023-03-19: 100 mL via INTRAVENOUS

## 2023-03-19 MED ORDER — HALOPERIDOL LACTATE 5 MG/ML IJ SOLN
2.0000 mg | Freq: Once | INTRAMUSCULAR | Status: AC
  Administered 2023-03-19: 2 mg via INTRAMUSCULAR
  Filled 2023-03-19: qty 1

## 2023-03-19 MED ORDER — FUROSEMIDE 10 MG/ML IJ SOLN
40.0000 mg | Freq: Once | INTRAMUSCULAR | Status: AC
  Administered 2023-03-19: 40 mg via INTRAVENOUS
  Filled 2023-03-19: qty 4

## 2023-03-19 NOTE — ED Triage Notes (Signed)
First Nurse Note; Pt via [facility] from [facility], report HTN and AMS. Pt usually able to have conversation but pt is unable to do that today. 180/98 BP, 95% on 2L chronically, 35 ETCO2, 86 HR. Pt is alert but confused.

## 2023-03-19 NOTE — ED Provider Notes (Signed)
[facility] Provider Note   Event Date/Time   First MD Initiated Contact with Patient 03/19/23 1920     (approximate) History  Altered Mental Status  HPI Julie Acosta is a 89 y.o. female with past medical history of hypertension, diabetes, diverticulosis, chronic kidney disease, and recurrent bouts of confusion who presents for altered mental status and hypoxia from her long-term care facility today.  Patient arrives agitated and unable to perform history or review of systems. ROS: Unable to assess as   Physical Exam  Triage Vital Signs: ED Triage Vitals [03/19/23 1837]  Encounter Vitals Group     BP (!) 181/84     Systolic BP Percentile      Diastolic BP Percentile      Pulse Rate 86     Resp (!) 24     Temp 99.7 F (37.6 C)     Temp Source Axillary     SpO2 95 %     Weight      Height      Head Circumference      Peak Flow      Pain Score      Pain Loc      Pain Education      Exclude from Growth Chart    Most recent vital signs: Vitals:   03/19/23 2000 03/19/23 2120  BP: 135/73 117/87  Pulse: 88 (!) 108  Resp: (!) 25 15  Temp:    SpO2: 98% 92%   General: Awake, uncooperative CV:  Good peripheral perfusion.  Resp:  Normal effort.  Abd:  No distention.  Other:  Agitated elderly obese Caucasian female around the bed intermittently yelling and agitated.  Left AKA ED Results / Procedures / Treatments  Labs (all labs ordered are listed, but only abnormal results are displayed) Labs Reviewed  COMPREHENSIVE METABOLIC PANEL - Abnormal; Notable for the following components:      Result Value   Glucose, Bld 135 (*)    Albumin 3.4 (*)    Total Bilirubin 1.2 (*)    All other components within normal limits  LACTIC ACID, PLASMA - Abnormal; Notable for the following components:   Lactic Acid, Venous 2.1 (*)    All other components within normal limits  LACTIC ACID, PLASMA - Abnormal; Notable for the following components:   Lactic Acid,  Venous 2.1 (*)    All other components within normal limits  CBC WITH DIFFERENTIAL/PLATELET - Abnormal; Notable for the following components:   WBC 13.1 (*)    Neutro Abs 9.8 (*)    Monocytes Absolute 1.1 (*)    Abs Immature Granulocytes 0.09 (*)    All other components within normal limits  URINALYSIS, W/ REFLEX TO CULTURE (INFECTION SUSPECTED) - Abnormal; Notable for the following components:   Color, Urine STRAW (*)    APPearance CLEAR (*)    Hgb urine dipstick LARGE (*)    Ketones, ur 5 (*)    Protein, ur 30 (*)    All other components within normal limits  BRAIN NATRIURETIC PEPTIDE - Abnormal; Notable for the following components:   B Natriuretic Peptide 230.1 (*)    All other components within normal limits  BLOOD GAS, VENOUS - Abnormal; Notable for the following components:   pH, Ven 7.44 (*)    pCO2, Ven 41 (*)    pO2, Ven 59 (*)    Acid-Base Excess 3.3 (*)    All other components within normal limits  CULTURE, BLOOD (ROUTINE  X 2)  CULTURE, BLOOD (ROUTINE X 2)  PROTIME-INR   EKG ED ECG REPORT I, Merwyn Katos, the attending physician, personally viewed and interpreted this ECG. Date: 03/19/2023 EKG Time: 1843 Rate: 87 Rhythm: normal sinus rhythm QRS Axis: normal Intervals: normal ST/T Wave abnormalities: normal Narrative Interpretation: no evidence of acute ischemia RADIOLOGY ED MD interpretation: Single view portable chest x-ray interpreted independently and shows cardiomegaly with low lung volumes, vascular crowding and signs of pulmonary edema -Agree with radiology assessment Official radiology report(s): DG Chest 1 View  Result Date: 03/19/2023 CLINICAL DATA:  Altered mental status. EXAM: CHEST  1 VIEW COMPARISON:  December 15, 2021 FINDINGS: There is stable mild to moderate severity cardiac silhouette enlargement. Marked severity calcification of the aortic arch is seen. Low lung volumes are noted with mild, diffuse, chronic appearing increased interstitial  lung markings. Mild areas of scarring and/or atelectasis are seen within the bilateral lung bases. There is a small left pleural effusion. No pneumothorax is identified. No acute osseous abnormalities are identified. IMPRESSION: 1. Cardiomegaly and low lung volumes with mild bibasilar scarring and/or atelectasis. 2. Small left pleural effusion. Electronically Signed   By: Aram Candela M.D.   On: 03/19/2023 21:41   PROCEDURES: Critical Care performed: Yes, see critical care procedure note(s) .1-3 Lead EKG Interpretation  Performed by: Merwyn Katos, MD Authorized by: Merwyn Katos, MD     Interpretation: abnormal     ECG rate:  111   ECG rate assessment: tachycardic     Rhythm: sinus tachycardia     Ectopy: none     Conduction: normal   CRITICAL CARE Performed by: Merwyn Katos  Total critical care time: 41 minutes  Critical care time was exclusive of separately billable procedures and treating other patients.  Critical care was necessary to treat or prevent imminent or life-threatening deterioration.  Critical care was time spent personally by me on the following activities: development of treatment plan with patient and/or surrogate as well as nursing, discussions with consultants, evaluation of patient's response to treatment, examination of patient, obtaining history from patient or surrogate, ordering and performing treatments and interventions, ordering and review of laboratory studies, ordering and review of radiographic studies, pulse oximetry and re-evaluation of patient's condition.  MEDICATIONS ORDERED IN ED: Medications  haloperidol lactate (HALDOL) injection 2 mg (2 mg Intramuscular Given 03/19/23 1929)  furosemide (LASIX) injection 40 mg (40 mg Intravenous Given 03/19/23 1957)  haloperidol lactate (HALDOL) injection 2 mg (2 mg Intravenous Given 03/19/23 2010)  LORazepam (ATIVAN) injection 1 mg (1 mg Intravenous Given 03/19/23 2133)   IMPRESSION / MDM / ASSESSMENT  AND PLAN / ED COURSE  I reviewed the triage vital signs and the nursing notes.                             The patient is on the cardiac monitor to evaluate for evidence of arrhythmia and/or significant heart rate changes. Patient's presentation is most consistent with acute presentation with potential threat to life or bodily function. Patient presents for altered mental status of unknown origin  Will obtain medical workup and discuss with social work to try to obtain collateral information.  Given History, Physical, and Workup there is no overt concern for a dangerous emergent cause such as, but not limited to, CNS infection, severe Toxidrome, severe metabolic derangement, or stroke.  Disposition: Care of this patient will be signed out to the oncoming physician  at the end of my shift.  All pertinent patient information conveyed and all questions answered.  All further care and disposition decisions will be made by the oncoming physician.   FINAL CLINICAL IMPRESSION(S) / ED DIAGNOSES   Final diagnoses:  Altered mental status, unspecified altered mental status type  Respiratory distress   Rx / DC Orders   ED Discharge Orders     None      Note:  This document was prepared using Dragon voice recognition software and may include unintentional dictation errors.   Merwyn Katos, MD 03/19/23 [phone]

## 2023-03-20 ENCOUNTER — Encounter: Payer: Self-pay | Admitting: Family Medicine

## 2023-03-20 DIAGNOSIS — Z79899 Other long term (current) drug therapy: Secondary | ICD-10-CM | POA: Diagnosis not present

## 2023-03-20 DIAGNOSIS — E1122 Type 2 diabetes mellitus with diabetic chronic kidney disease: Secondary | ICD-10-CM | POA: Diagnosis present

## 2023-03-20 DIAGNOSIS — Z89612 Acquired absence of left leg above knee: Secondary | ICD-10-CM | POA: Diagnosis not present

## 2023-03-20 DIAGNOSIS — I129 Hypertensive chronic kidney disease with stage 1 through stage 4 chronic kidney disease, or unspecified chronic kidney disease: Secondary | ICD-10-CM | POA: Diagnosis present

## 2023-03-20 DIAGNOSIS — Z7984 Long term (current) use of oral hypoglycemic drugs: Secondary | ICD-10-CM | POA: Diagnosis not present

## 2023-03-20 DIAGNOSIS — G4700 Insomnia, unspecified: Secondary | ICD-10-CM | POA: Diagnosis present

## 2023-03-20 DIAGNOSIS — D6851 Activated protein C resistance: Secondary | ICD-10-CM | POA: Diagnosis present

## 2023-03-20 DIAGNOSIS — R0603 Acute respiratory distress: Secondary | ICD-10-CM | POA: Diagnosis present

## 2023-03-20 DIAGNOSIS — F32A Depression, unspecified: Secondary | ICD-10-CM | POA: Diagnosis present

## 2023-03-20 DIAGNOSIS — I4891 Unspecified atrial fibrillation: Secondary | ICD-10-CM | POA: Diagnosis not present

## 2023-03-20 DIAGNOSIS — A419 Sepsis, unspecified organism: Secondary | ICD-10-CM | POA: Diagnosis present

## 2023-03-20 DIAGNOSIS — G9341 Metabolic encephalopathy: Secondary | ICD-10-CM

## 2023-03-20 DIAGNOSIS — E1142 Type 2 diabetes mellitus with diabetic polyneuropathy: Secondary | ICD-10-CM | POA: Insufficient documentation

## 2023-03-20 DIAGNOSIS — I10 Essential (primary) hypertension: Secondary | ICD-10-CM | POA: Insufficient documentation

## 2023-03-20 DIAGNOSIS — Z66 Do not resuscitate: Secondary | ICD-10-CM | POA: Diagnosis present

## 2023-03-20 DIAGNOSIS — N390 Urinary tract infection, site not specified: Secondary | ICD-10-CM

## 2023-03-20 DIAGNOSIS — J189 Pneumonia, unspecified organism: Secondary | ICD-10-CM | POA: Diagnosis present

## 2023-03-20 DIAGNOSIS — K219 Gastro-esophageal reflux disease without esophagitis: Secondary | ICD-10-CM | POA: Diagnosis present

## 2023-03-20 DIAGNOSIS — E785 Hyperlipidemia, unspecified: Secondary | ICD-10-CM | POA: Diagnosis present

## 2023-03-20 DIAGNOSIS — R6520 Severe sepsis without septic shock: Secondary | ICD-10-CM | POA: Diagnosis present

## 2023-03-20 DIAGNOSIS — Z1152 Encounter for screening for COVID-19: Secondary | ICD-10-CM | POA: Diagnosis not present

## 2023-03-20 DIAGNOSIS — E872 Acidosis, unspecified: Secondary | ICD-10-CM | POA: Diagnosis present

## 2023-03-20 DIAGNOSIS — E876 Hypokalemia: Secondary | ICD-10-CM | POA: Diagnosis present

## 2023-03-20 DIAGNOSIS — E0590 Thyrotoxicosis, unspecified without thyrotoxic crisis or storm: Secondary | ICD-10-CM | POA: Diagnosis present

## 2023-03-20 DIAGNOSIS — Z85828 Personal history of other malignant neoplasm of skin: Secondary | ICD-10-CM | POA: Diagnosis not present

## 2023-03-20 HISTORY — DX: Sepsis, unspecified organism: A41.9

## 2023-03-20 LAB — GASTROINTESTINAL PANEL BY PCR, STOOL (REPLACES STOOL CULTURE)

## 2023-03-20 LAB — BLOOD GAS, VENOUS
Acid-Base Excess: 4.5 mmol/L — ABNORMAL HIGH (ref 0.0–2.0)
Bicarbonate: 30.4 mmol/L — ABNORMAL HIGH (ref 20.0–28.0)
O2 Saturation: 93.3 %
Patient temperature: 37
pCO2, Ven: 49 mm[Hg] (ref 44–60)
pH, Ven: 7.4 (ref 7.25–7.43)
pO2, Ven: 62 mm[Hg] — ABNORMAL HIGH (ref 32–45)

## 2023-03-20 LAB — GLUCOSE, CAPILLARY
Glucose-Capillary: 117 mg/dL — ABNORMAL HIGH (ref 70–99)
Glucose-Capillary: 204 mg/dL — ABNORMAL HIGH (ref 70–99)

## 2023-03-20 LAB — PROTIME-INR
INR: 1.2 (ref 0.8–1.2)
INR: 1.2 (ref 0.8–1.2)
Prothrombin Time: 15.1 s (ref 11.4–15.2)
Prothrombin Time: 15.1 s (ref 11.4–15.2)

## 2023-03-20 LAB — MRSA NEXT GEN BY PCR, NASAL: MRSA by PCR Next Gen: NOT DETECTED

## 2023-03-20 LAB — RESPIRATORY PANEL BY PCR

## 2023-03-20 LAB — LACTIC ACID, PLASMA
Lactic Acid, Venous: 2.1 mmol/L (ref 0.5–1.9)
Lactic Acid, Venous: 1.8 mmol/L (ref 0.5–1.9)
Lactic Acid, Venous: 1.5 mmol/L (ref 0.5–1.9)
Lactic Acid, Venous: 1 mmol/L (ref 0.5–1.9)

## 2023-03-20 LAB — PROCALCITONIN: Procalcitonin: 0.17 ng/mL

## 2023-03-20 LAB — C DIFFICILE QUICK SCREEN W PCR REFLEX
C Diff antigen: NEGATIVE
C Diff interpretation: NOT DETECTED
C Diff toxin: NEGATIVE

## 2023-03-20 LAB — TSH: TSH: 0.307 u[IU]/mL — ABNORMAL LOW (ref 0.350–4.500)

## 2023-03-20 LAB — T4, FREE: Free T4: 0.84 ng/dL (ref 0.61–1.12)

## 2023-03-20 LAB — APTT
aPTT: 20 s — ABNORMAL LOW (ref 24–36)
aPTT: 29 s (ref 24–36)

## 2023-03-20 LAB — SARS CORONAVIRUS 2 BY RT PCR: SARS Coronavirus 2 by RT PCR: NEGATIVE

## 2023-03-20 LAB — VITAMIN B12: Vitamin B-12: 326 pg/mL (ref 180–914)

## 2023-03-20 LAB — HEMOGLOBIN A1C
Hgb A1c MFr Bld: 6.5 % — ABNORMAL HIGH (ref 4.8–5.6)
Mean Plasma Glucose: 139.85 mg/dL

## 2023-03-20 LAB — CBG MONITORING, ED
Glucose-Capillary: 148 mg/dL — ABNORMAL HIGH (ref 70–99)
Glucose-Capillary: 143 mg/dL — ABNORMAL HIGH (ref 70–99)

## 2023-03-20 LAB — AMMONIA: Ammonia: 31 umol/L (ref 9–35)

## 2023-03-20 LAB — CK: Total CK: 327 U/L — ABNORMAL HIGH (ref 38–234)

## 2023-03-20 MED ORDER — METOPROLOL TARTRATE 5 MG/5ML IV SOLN
5.0000 mg | INTRAVENOUS | Status: DC | PRN
Start: 2023-03-20 — End: 2023-03-30
  Administered 2023-03-21 (×2): 5 mg via INTRAVENOUS
  Filled 2023-03-20 (×2): qty 5

## 2023-03-20 MED ORDER — DICLOFENAC SODIUM 1 % EX GEL: 4.0000 g | Freq: Four times a day (QID) | CUTANEOUS | Status: DC | PRN

## 2023-03-20 MED ORDER — GUAIFENESIN 100 MG/5ML PO LIQD
5.0000 mL | ORAL | Status: DC | PRN
  Administered 2023-03-25 – 2023-03-26 (×2): 5 mL via ORAL
  Filled 2023-03-20 (×2): qty 10

## 2023-03-20 MED ORDER — HYDRALAZINE HCL 20 MG/ML IJ SOLN
10.0000 mg | INTRAMUSCULAR | Status: DC | PRN
Start: 2023-03-20 — End: 2023-03-30

## 2023-03-20 MED ORDER — ACETAMINOPHEN 325 MG RE SUPP
650.0000 mg | Freq: Once | RECTAL | Status: AC
  Administered 2023-03-20: 650 mg via RECTAL
  Filled 2023-03-20: qty 2

## 2023-03-20 MED ORDER — SODIUM CHLORIDE 0.9 % IV SOLN
2.0000 g | Freq: Once | INTRAVENOUS | Status: AC
  Administered 2023-03-20: 2 g via INTRAVENOUS
  Filled 2023-03-20: qty 12.5

## 2023-03-20 MED ORDER — MELATONIN 5 MG PO TABS
2.5000 mg | ORAL_TABLET | Freq: Every day | ORAL | Status: DC
  Administered 2023-03-20 – 2023-03-29 (×10): 2.5 mg via ORAL
  Filled 2023-03-20 (×10): qty 1

## 2023-03-20 MED ORDER — VITAMIN D 25 MCG (1000 UNIT) PO TABS
1000.0000 [IU] | ORAL_TABLET | Freq: Every day | ORAL | Status: DC
  Administered 2023-03-21 – 2023-03-30 (×10): 1000 [IU] via ORAL
  Filled 2023-03-20 (×10): qty 1

## 2023-03-20 MED ORDER — LACTATED RINGERS IV BOLUS (SEPSIS)
1000.0000 mL | Freq: Once | INTRAVENOUS | Status: AC
Start: 2023-03-20 — End: 2023-03-20
  Administered 2023-03-20: 1000 mL via INTRAVENOUS

## 2023-03-20 MED ORDER — INSULIN ASPART 100 UNIT/ML IJ SOLN
0.0000 [IU] | Freq: Three times a day (TID) | INTRAMUSCULAR | Status: DC
  Administered 2023-03-21: 3 [IU] via SUBCUTANEOUS
  Administered 2023-03-21: 2 [IU] via SUBCUTANEOUS
  Administered 2023-03-21 – 2023-03-22 (×4): 3 [IU] via SUBCUTANEOUS
  Administered 2023-03-23: 2 [IU] via SUBCUTANEOUS
  Administered 2023-03-23: 3 [IU] via SUBCUTANEOUS
  Administered 2023-03-23: 5 [IU] via SUBCUTANEOUS
  Administered 2023-03-24: 2 [IU] via SUBCUTANEOUS
  Administered 2023-03-24: 3 [IU] via SUBCUTANEOUS
  Administered 2023-03-25 (×2): 5 [IU] via SUBCUTANEOUS
  Administered 2023-03-25: 3 [IU] via SUBCUTANEOUS
  Administered 2023-03-26 (×3): 5 [IU] via SUBCUTANEOUS
  Administered 2023-03-27: 3 [IU] via SUBCUTANEOUS
  Administered 2023-03-27: 8 [IU] via SUBCUTANEOUS
  Administered 2023-03-27: 3 [IU] via SUBCUTANEOUS
  Administered 2023-03-28: 2 [IU] via SUBCUTANEOUS
  Administered 2023-03-28: 3 [IU] via SUBCUTANEOUS
  Administered 2023-03-28: 5 [IU] via SUBCUTANEOUS
  Administered 2023-03-29: 2 [IU] via SUBCUTANEOUS
  Administered 2023-03-29: 3 [IU] via SUBCUTANEOUS
  Administered 2023-03-29: 1 [IU] via SUBCUTANEOUS
  Administered 2023-03-30 (×2): 5 [IU] via SUBCUTANEOUS
  Filled 2023-03-20 (×29): qty 1

## 2023-03-20 MED ORDER — VANCOMYCIN HCL 1500 MG/300ML IV SOLN
1500.0000 mg | Freq: Once | INTRAVENOUS | Status: AC
  Administered 2023-03-20: 1500 mg via INTRAVENOUS
  Filled 2023-03-20: qty 300

## 2023-03-20 MED ORDER — DEXTROSE 5 % IV SOLN
500.0000 mg | INTRAVENOUS | Status: DC
  Administered 2023-03-20 – 2023-03-22 (×3): 500 mg via INTRAVENOUS
  Filled 2023-03-20 (×3): qty 5

## 2023-03-20 MED ORDER — LACTATED RINGERS IV SOLN
150.0000 mL/h | INTRAVENOUS | Status: DC
  Administered 2023-03-20 (×2): 150 mL/h via INTRAVENOUS

## 2023-03-20 MED ORDER — IPRATROPIUM-ALBUTEROL 0.5-2.5 (3) MG/3ML IN SOLN
3.0000 mL | RESPIRATORY_TRACT | Status: DC | PRN
  Administered 2023-03-20: 3 mL via RESPIRATORY_TRACT
  Filled 2023-03-20: qty 3

## 2023-03-20 MED ORDER — DABIGATRAN ETEXILATE MESYLATE 150 MG PO CAPS
150.0000 mg | ORAL_CAPSULE | Freq: Two times a day (BID) | ORAL | Status: DC
  Administered 2023-03-20 – 2023-03-25 (×8): 150 mg via ORAL
  Filled 2023-03-20 (×13): qty 1

## 2023-03-20 MED ORDER — INSULIN ASPART 100 UNIT/ML IJ SOLN
0.0000 [IU] | Freq: Every day | INTRAMUSCULAR | Status: DC
  Administered 2023-03-20 – 2023-03-26 (×2): 2 [IU] via SUBCUTANEOUS
  Administered 2023-03-27: 4 [IU] via SUBCUTANEOUS
  Filled 2023-03-20 (×3): qty 1

## 2023-03-20 MED ORDER — GLIPIZIDE ER 2.5 MG PO TB24
2.5000 mg | ORAL_TABLET | Freq: Every day | ORAL | Status: DC
  Filled 2023-03-20: qty 1

## 2023-03-20 MED ORDER — LACTATED RINGERS IV BOLUS (SEPSIS)
250.0000 mL | Freq: Once | INTRAVENOUS | Status: AC
  Administered 2023-03-20: 250 mL via INTRAVENOUS

## 2023-03-20 MED ORDER — DEXTROSE-SODIUM CHLORIDE 5-0.45 % IV SOLN: INTRAVENOUS | Status: AC

## 2023-03-20 MED ORDER — METOPROLOL TARTRATE 50 MG PO TABS
50.0000 mg | ORAL_TABLET | Freq: Two times a day (BID) | ORAL | Status: DC
  Administered 2023-03-20 – 2023-03-30 (×19): 50 mg via ORAL
  Filled 2023-03-20 (×21): qty 1

## 2023-03-20 MED ORDER — SODIUM CHLORIDE 0.9 % IV BOLUS
250.0000 mL | Freq: Once | INTRAVENOUS | Status: AC
  Administered 2023-03-20: 250 mL via INTRAVENOUS

## 2023-03-20 MED ORDER — SODIUM CHLORIDE 0.9 % IV SOLN
2.0000 g | INTRAVENOUS | Status: DC
  Administered 2023-03-20 – 2023-03-22 (×3): 2 g via INTRAVENOUS
  Filled 2023-03-20 (×3): qty 20

## 2023-03-20 MED ORDER — SODIUM CHLORIDE 0.9 % IV SOLN
2.0000 g | Freq: Two times a day (BID) | INTRAVENOUS | Status: DC
  Administered 2023-03-20: 2 g via INTRAVENOUS
  Filled 2023-03-20: qty 12.5

## 2023-03-20 MED ORDER — POLYETHYLENE GLYCOL 3350 17 G PO PACK: 17.0000 g | PACK | Freq: Every day | ORAL | Status: DC | PRN

## 2023-03-20 MED ORDER — IPRATROPIUM-ALBUTEROL 0.5-2.5 (3) MG/3ML IN SOLN
3.0000 mL | Freq: Four times a day (QID) | RESPIRATORY_TRACT | Status: DC
  Administered 2023-03-20 – 2023-03-21 (×3): 3 mL via RESPIRATORY_TRACT
  Filled 2023-03-20 (×4): qty 3

## 2023-03-20 MED ORDER — ZIPRASIDONE MESYLATE 20 MG IM SOLR
10.0000 mg | Freq: Four times a day (QID) | INTRAMUSCULAR | Status: DC | PRN
  Administered 2023-03-20 (×2): 10 mg via INTRAMUSCULAR
  Filled 2023-03-20 (×3): qty 20

## 2023-03-20 MED ORDER — ONDANSETRON HCL 4 MG/2ML IJ SOLN: 4.0000 mg | Freq: Four times a day (QID) | INTRAMUSCULAR | Status: DC | PRN

## 2023-03-20 MED ORDER — ACETAMINOPHEN 650 MG RE SUPP
650.0000 mg | Freq: Four times a day (QID) | RECTAL | Status: AC
  Administered 2023-03-20 – 2023-03-22 (×8): 650 mg via RECTAL
  Filled 2023-03-20: qty 2
  Filled 2023-03-20 (×3): qty 1
  Filled 2023-03-20: qty 2
  Filled 2023-03-20 (×3): qty 1

## 2023-03-20 MED ORDER — SODIUM CHLORIDE 0.9 % IV SOLN: 2.0000 g | Freq: Two times a day (BID) | INTRAVENOUS | Status: DC

## 2023-03-20 MED ORDER — LORATADINE 10 MG PO TABS
10.0000 mg | ORAL_TABLET | Freq: Every day | ORAL | Status: DC
  Administered 2023-03-21 – 2023-03-30 (×10): 10 mg via ORAL
  Filled 2023-03-20 (×10): qty 1

## 2023-03-20 MED ORDER — AZITHROMYCIN 500 MG IV SOLR
500.0000 mg | Freq: Once | INTRAVENOUS | Status: AC
  Administered 2023-03-20: 500 mg via INTRAVENOUS
  Filled 2023-03-20: qty 5

## 2023-03-20 MED ORDER — GABAPENTIN 100 MG PO CAPS
100.0000 mg | ORAL_CAPSULE | Freq: Three times a day (TID) | ORAL | Status: DC
  Administered 2023-03-20 – 2023-03-24 (×11): 100 mg via ORAL
  Filled 2023-03-20 (×11): qty 1

## 2023-03-20 MED ORDER — PAROXETINE HCL 30 MG PO TABS
30.0000 mg | ORAL_TABLET | Freq: Every day | ORAL | Status: DC
  Administered 2023-03-21 – 2023-03-30 (×10): 30 mg via ORAL
  Filled 2023-03-20 (×11): qty 1

## 2023-03-20 MED ORDER — VANCOMYCIN HCL IN DEXTROSE 1-5 GM/200ML-% IV SOLN
1000.0000 mg | Freq: Once | INTRAVENOUS | Status: DC
  Filled 2023-03-20: qty 200

## 2023-03-20 MED ORDER — ACETAMINOPHEN 325 MG RE SUPP
RECTAL | Status: AC
  Administered 2023-03-20: 650 mg
  Filled 2023-03-20: qty 3

## 2023-03-20 MED ORDER — ACETAMINOPHEN 325 MG PO TABS: 650.0000 mg | ORAL_TABLET | Freq: Four times a day (QID) | ORAL | Status: DC | PRN

## 2023-03-20 MED ORDER — ONDANSETRON HCL 4 MG PO TABS: 4.0000 mg | ORAL_TABLET | ORAL | Status: DC | PRN

## 2023-03-20 MED ORDER — METHIMAZOLE 10 MG PO TABS
10.0000 mg | ORAL_TABLET | Freq: Every day | ORAL | Status: DC
  Administered 2023-03-21 – 2023-03-30 (×10): 10 mg via ORAL
  Filled 2023-03-20 (×11): qty 1

## 2023-03-20 MED ORDER — FLUTICASONE PROPIONATE 50 MCG/ACT NA SUSP
2.0000 | Freq: Two times a day (BID) | NASAL | Status: DC
  Administered 2023-03-21 – 2023-03-30 (×18): 2 via NASAL
  Filled 2023-03-20 (×3): qty 16

## 2023-03-20 MED ORDER — ACETAMINOPHEN 325 MG RE SUPP: 925.0000 mg | Freq: Once | RECTAL | Status: DC

## 2023-03-20 MED ORDER — TIZANIDINE HCL 2 MG PO TABS: 2.0000 mg | ORAL_TABLET | Freq: Two times a day (BID) | ORAL | Status: DC | PRN

## 2023-03-20 MED ORDER — PANTOPRAZOLE SODIUM 40 MG PO TBEC
40.0000 mg | DELAYED_RELEASE_TABLET | Freq: Every day | ORAL | Status: DC
  Administered 2023-03-21 – 2023-03-30 (×10): 40 mg via ORAL
  Filled 2023-03-20 (×10): qty 1

## 2023-03-20 NOTE — H&P (Addendum)
[place]   PATIENT NAME: Julie Acosta    MR#:  [id number]  DATE OF BIRTH:  1933/10/05  DATE OF ADMISSION:  03/19/2023  PRIMARY CARE PHYSICIAN: Earnestine Mealing, MD   Patient is coming from: SNF  REQUESTING/REFERRING PHYSICIAN: Chiquita Loth, MD  CHIEF COMPLAINT:   Chief Complaint  Patient presents with   Altered Mental Status    HISTORY OF PRESENT ILLNESS:  Julie Acosta is a 89 y.o. Caucasian female with medical history significant for anxiety, depression, type 2 diabetes mellitus, GERD, hypertension and dyslipidemia, presented to the emergency room with acute onset of altered mental status with confusion, restlessness and occasional agitation as well as hypoxia at her SNF.  No further history could be obtained from the patient in the ER.  ED Course: When she came to the ER, BP was 148/126 with heart rate of 105 and pulse oximetry was 91 to 92% on 2 L of O2 by nasal cannula and later on 94 to 96%.  Labs revealed blood gas with pH 7.44 and HCO3 27.8.  Lactic acid was 2.1 and later the same day and 1.8.  CBC showed leukocytosis of 13.1 with neutrophilia and CMP was remarkable for albumin 3.4 with total bili of 1.2.  BNP was 230.  UA showed 6-10 WBCs with more than 50 RBCs and 30 protein.  Blood cultures were drawn. EKG as reviewed by me : EKG showed sinus rhythm with rate of 87 with first-degree AV block and left axis deviation with right bundle branch block. Imaging: Portable chest x-ray showed cardiomegaly with low lung volumes with mild bibasal scarring and/or atelectasis and small left pleural effusion.  The patient was given IV cefepime and Zithromax as well as 4 mg of IV Haldol, 1 mg of IV Ativan and 10 mg of IV IM Geodon for her agitation.  She will be admitted to a medical telemetry bed for further evaluation and management. 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  Oncotype recurrence score: 1.  DCIS, margin less than 0.5 mm.  Negative sentinel node.  Accelerated partial breast radiation.   Cervicalgia    Chronic kidney disease    kidney stones   Colon cancer [facility])    patient unaware of this   Complication of anesthesia    mood alteration / not sure if d/t pain medicine or anesthesia as she passed out    Cough    NASAL DRIP / SNEEZING / SORE THROAT MOSTLY CONSTANT   Depression    Diabetes (HCC)    Diverticulitis    Dizzy    GERD (gastroesophageal reflux disease)    H/O blood clots    arm and leg   HBP (high blood pressure)    Hematuria    gross   Hemorrhoids    HLD (hyperlipidemia)    Microscopic hematuria 06/02/2015   Muscle pain    Osteoarthritis    Osteoporosis    Panic disorder    Personal history of radiation therapy    PND (post-nasal drip)    CHRONIC WITH SORE THROAT AND SNEEZING   Reflux  Sepsis (HCC)    Sepsis, unspecified organism (HCC) 12/15/2021   Formatting of this note might be different from the original.  Last Assessment & Plan: Formatting of this note might be different from the original. Appears to be multifactorial and secondary to healthcare associated pneumonia as well as a UTI CT angio shows asymmetric airspace disease at the left base and posteriorly in the right upper lobe concerning for pneumonia Patient also has pyuria Patient   Severe sepsis (HCC) 12/15/2021   Skin cancer    nose   Swelling    Tremor    Tremors of nervous system    Ureteral stone with hydronephrosis 11/26/2018    PAST SURGICAL HISTORY:   Past Surgical History:  Procedure Laterality Date   APPENDECTOMY  1962   BREAST BIOPSY Right 2006?   benign   BREAST BIOPSY Left 2019   invasive mammary carcinoma   BREAST CYST  EXCISION Left 07/27/2017   Procedure: SKIN CYST EXCISED;  Surgeon: Earline Mayotte, MD;  Location: [facility] ORS;  Service: General;  Laterality: Left;   BREAST LUMPECTOMY Left 07/2017   invasive mammary, DCIS  mammosite   BREAST SURGERY Right 2019   CARPAL TUNNEL RELEASE Right    CATARACT EXTRACTION W/PHACO Left 11/11/2016   Procedure: CATARACT EXTRACTION PHACO AND INTRAOCULAR LENS PLACEMENT (IOC);  Surgeon: Galen Manila, MD;  Location: [facility] ORS;  Service: Ophthalmology;  Laterality: Left;  [place] 01:07.9 AP% 19.2 CDE 13.02 Fluid pack lot # [id number]   CATARACT EXTRACTION W/PHACO Right 12/09/2016   Procedure: CATARACT EXTRACTION PHACO AND INTRAOCULAR LENS PLACEMENT (IOC);  Surgeon: Galen Manila, MD;  Location: [facility] ORS;  Service: Ophthalmology;  Laterality: Right;  [place] 00:49 AP% 21.2 CDE 10.39 Fluid pack lot # [id number]   CHOLECYSTECTOMY     COLONOSCOPY WITH PROPOFOL N/A 11/22/2014   Procedure: COLONOSCOPY WITH PROPOFOL;  Surgeon: Scot Jun, MD;  Location: [facility] ENDOSCOPY;  Service: Endoscopy;  Laterality: N/A;   CYSTOSCOPY WITH STENT PLACEMENT Bilateral 11/27/2018   Procedure: CYSTOSCOPY WITH STENT PLACEMENT;  Surgeon: Jerilee Field, MD;  Location: [facility] ORS;  Service: Urology;  Laterality: Bilateral;   CYSTOSCOPY/URETEROSCOPY/HOLMIUM LASER/STENT PLACEMENT Bilateral 12/17/2018   Procedure: CYSTOSCOPY/URETEROSCOPY/HOLMIUM LASER/STENT Exchange;  Surgeon: Sondra Come, MD;  Location: [facility] ORS;  Service: Urology;  Laterality: Bilateral;   ESOPHAGOGASTRODUODENOSCOPY  11/22/2014   Procedure: ESOPHAGOGASTRODUODENOSCOPY (EGD);  Surgeon: Scot Jun, MD;  Location: [facility] ENDOSCOPY;  Service: Endoscopy;;   ESOPHAGOGASTRODUODENOSCOPY (EGD) WITH PROPOFOL N/A 03/05/2017   Procedure: ESOPHAGOGASTRODUODENOSCOPY (EGD) WITH PROPOFOL;  Surgeon: Toney Reil, MD;  Location: [facility] ENDOSCOPY;  Service: Gastroenterology;  Laterality: N/A;   EXTRACORPOREAL SHOCK WAVE LITHOTRIPSY Right 03/04/2018    Procedure: EXTRACORPOREAL SHOCK WAVE LITHOTRIPSY (ESWL);  Surgeon: Sondra Come, MD;  Location: [facility] ORS;  Service: Urology;  Laterality: Right;   EYE SURGERY Bilateral 2012   cataract extraction with iol   GALLBLADDER SURGERY  1968   hemi pelvectomy     left side at age 82   HEMIPELVIC Left 1962   LEG SURGERY Left    AMPUTATION d/t cancer at 89 years old   MASTECTOMY, PARTIAL Left 07/27/2017   Procedure: MASTECTOMY PARTIAL;  Surgeon: Earline Mayotte, MD;  Location: [facility] ORS;  Service: General;  Laterality: Left;   MOUTH SURGERY  2019   teeth pulled with a bridge insertion.   fell out 12/16/18   OVARY SURGERY Right    cyst removed   SAVORY DILATION  11/22/2014   Procedure: SAVORY DILATION;  Surgeon: Molly Maduro  Daron Offer, MD;  Location: [facility] ENDOSCOPY;  Service: Endoscopy;;   SENTINEL NODE BIOPSY Left 07/27/2017   Procedure: SENTINEL NODE BIOPSY;  Surgeon: Earline Mayotte, MD;  Location: [facility] ORS;  Service: General;  Laterality: Left;   SKIN CANCER EXCISION     nose and arm and face   TEE WITHOUT CARDIOVERSION N/A 07/25/2019   Procedure: TRANSESOPHAGEAL ECHOCARDIOGRAM (TEE);  Surgeon: Lamar Blinks, MD;  Location: [facility] ORS;  Service: Cardiovascular;  Laterality: N/A;   TONSILLECTOMY      SOCIAL HISTORY:   Social History   Tobacco Use   Smoking status: Never   Smokeless tobacco: Never  Substance Use Topics   Alcohol use: No    FAMILY HISTORY:   Family History  Problem Relation Age of Onset   Breast cancer Paternal Aunt    Ovarian cancer Sister    Prostate cancer Father    Stroke Mother    Kidney cancer Neg Hx    Bladder Cancer Neg Hx     DRUG ALLERGIES:   Allergies  Allergen Reactions   Ciprofloxacin Hives   Codeine Other (See Comments)    HALLUCINATIONS   Tape     Rash and skin irritation/ paper tape and tegaderm OK   Ciprocinonide [Fluocinolone] Other (See Comments)    unknown   Amoxicillin Other (See Comments)    Upset stomach Has patient had a  PCN reaction causing immediate rash, facial/tongue/throat swelling, SOB or lightheadedness with hypotension: No Has patient had a PCN reaction causing severe rash involving mucus membranes or skin necrosis: No Has patient had a PCN reaction that required hospitalization: No Has patient had a PCN reaction occurring within the last 10 years: Yes If all of the above answers are "NO", then may proceed with Cephalosporin use.;   Cefuroxime Other (See Comments)    OK INTRACAMERALLY PER DR WLP, upset stomach   Latex Rash    Rast test NEGATIVE   Lipitor [Atorvastatin] Other (See Comments)    unknown    REVIEW OF SYSTEMS:   ROS As per history of present illness. All pertinent systems were reviewed above. Constitutional, HEENT, cardiovascular, respiratory, GI, GU, musculoskeletal, neuro, psychiatric, endocrine, integumentary and hematologic systems were reviewed and are otherwise negative/unremarkable except for positive findings mentioned above in the HPI.   MEDICATIONS AT HOME:   Prior to Admission medications   Medication Sig Start Date End Date Taking? Authorizing Provider  acetaminophen (TYLENOL) 650 MG CR tablet Take 650 mg by mouth every 8 (eight) hours as needed for pain. Give one tablet by mouth three times daily.    [provider]  Cholecalciferol (VITAMIN D-1000 MAX ST) 25 MCG (1000 UT) tablet Take by mouth.    [provider]  dextromethorphan-guaiFENesin (ROBITUSSIN-DM) 10-100 MG/5ML liquid Take 10 mLs by mouth every 4 (four) hours as needed for cough.    [provider]  diclofenac Sodium (VOLTAREN) 1 % GEL Apply 4 g topically. Apply to stump topically every 6 hours as needed. Apply to right knee topically three times daily for pain.    [provider]  fluconazole (DIFLUCAN) 150 MG tablet Take 150 mg by mouth once.    [provider]  fluticasone (FLONASE) 50 MCG/ACT nasal spray Place 2 sprays into both nostrils 2 (two) times daily.     [provider]  gabapentin (NEURONTIN) 100 MG capsule Take 100 mg by mouth 3 (three) times daily. 09/19/21   [provider]  GLIPIZIDE XL 2.5 MG 24 hr  tablet Take 2.5 mg by mouth daily.  05/09/13   [provider]  hydrocortisone 2.5 % cream Apply to Hemorrhoids/buttucks topically every 8 hours as needed for hemorrhoids.    [provider]  hydrocortisone cream 1 % Apply to rectum topically every 6 hours as needed    [provider]  loratadine (CLARITIN) 10 MG tablet Take 10 mg by mouth daily.    [provider]  melatonin 3 MG TABS tablet Take 3 mg by mouth at bedtime.    [provider]  metFORMIN (GLUCOPHAGE) 500 MG tablet Take 750 mg by mouth 2 (two) times daily with a meal.    [provider]  methimazole (TAPAZOLE) 10 MG tablet Take 1 tablet by mouth daily. 02/16/23 02/16/24  [provider]  metoprolol tartrate (LOPRESSOR) 50 MG tablet Take 50 mg by mouth 2 (two) times daily.    [provider]  nystatin cream (MYCOSTATIN) Apply topically. 02/14/23   [provider]  ondansetron (ZOFRAN) 4 MG tablet Take 4 mg by mouth every 4 (four) hours as needed for nausea or vomiting.    [provider]  OXYGEN 2lpm as needed for sats less than 88% every 1 hour as needed    [provider]  pantoprazole (PROTONIX) 40 MG tablet Take 40 mg by mouth daily.    [provider]  PARoxetine (PAXIL) 30 MG tablet Take 30 mg by mouth daily. 10/01/21   [provider]  Polyethyl Glycol-Propyl Glycol 0.4-0.3 % SOLN Apply to eye 2 (two) times daily as needed.    [provider]  polyethylene glycol (MIRALAX / GLYCOLAX) 17 g packet Take 17 g by mouth daily as needed.    [provider]  PRADAXA 150 MG CAPS capsule TAKE 1 CAPSULE BY MOUTH TWICE DAILY START TREATMENT 10 HRS LAST DOSE OF LOVENOX 12/03/20   Jeralyn Ruths, MD  SUNSCREEN [id number] EX Apply topically. Apply  to exposed skin topically every 2 hours as needed for Sunburn Prevention    [provider]  tiZANidine (ZANAFLEX) 2 MG tablet Take 2 mg by mouth every 12 (twelve) hours as needed for muscle spasms. 09/27/21   [provider]  VITAMIN D, ERGOCALCIFEROL, PO Take one tablet ( ) by mouth once daily.    [provider]  Zinc Oxide (TRIPLE PASTE) 12.8 % ointment Apply to sacrum/buttocks topically every shift.    [provider]      VITAL SIGNS:  Blood pressure (!) 115/53, pulse 99, temperature (!) 101.3 F (38.5 C), temperature source Rectal, resp. rate (!) 23, weight 72 kg, SpO2 96%.  PHYSICAL EXAMINATION:  Physical Exam  GENERAL:  89 y.o.-year-old Caucasian female patient lying in the bed with no acute distress.  She was less than agitated. EYES: Pupils equal, round, reactive to light and accommodation. No scleral icterus. Extraocular muscles intact.  HEENT: Head atraumatic, normocephalic. Oropharynx and nasopharynx clear.  NECK:  Supple, no jugular venous distention. No thyroid enlargement, no tenderness.  LUNGS: Diminished bibasilar breath sounds with minor crackles.. No use of accessory muscles of respiration.  CARDIOVASCULAR: Regular rate and rhythm, S1, S2 normal. No murmurs, rubs, or gallops.  ABDOMEN: Soft, nondistended, nontender. Bowel sounds present. No organomegaly or mass.  EXTREMITIES: No right pedal edema, cyanosis, or clubbing.  She is status post left hemipelvic lower extremity amputation NEUROLOGIC: She was moving all 4 extremities with no lateralizing signs. PSYCHIATRIC: The patient is restless and agitated.  No good eye contact. SKIN: No  obvious rash, lesion, or ulcer.   LABORATORY PANEL:   CBC Recent Labs  Lab 03/19/23 1849  WBC 13.1*  HGB 12.6  HCT 40.5  PLT 244   ------------------------------------------------------------------------------------------------------------------  Chemistries  Recent Labs  Lab  03/19/23 1849  NA 136  K 4.4  CL 101  CO2 26  GLUCOSE 135*  BUN 15  CREATININE 0.59  CALCIUM 9.7  AST 24  ALT 11  ALKPHOS 59  BILITOT 1.2*   ------------------------------------------------------------------------------------------------------------------  Cardiac Enzymes No results for input(s): "TROPONINI" in the last 168 hours. ------------------------------------------------------------------------------------------------------------------  RADIOLOGY:  CT Angio Chest PE W/Cm &/Or Wo Cm  Result Date: 03/20/2023 CLINICAL DATA:  89 year old female with confusion presenting with altered mental status and hypoxia. PE suspected. EXAM: CT ANGIOGRAPHY CHEST WITH CONTRAST TECHNIQUE: Multidetector CT imaging of the chest was performed using the standard protocol during bolus administration of intravenous contrast. Multiplanar CT image reconstructions and MIPs were obtained to evaluate the vascular anatomy. RADIATION DOSE REDUCTION: This exam was performed according to the departmental dose-optimization program which includes automated exposure control, adjustment of the mA and/or kV according to patient size and/or use of iterative reconstruction technique. CONTRAST:  OMNIPAQUE IOHEXOL 350 MG/ML SOLN COMPARISON:  Chest radiograph earlier today and CT 06/27/2022 FINDINGS: Cardiovascular: Cardiomegaly. No pericardial effusion. Mitral annular calcification. Aortic and coronary artery calcification. Negative for acute pulmonary embolism. Mediastinum/Nodes: Trachea and esophagus are unremarkable. No thoracic adenopathy. Lungs/Pleura: Similar atelectasis or consolidation in the left lower lobe. Expiratory phase exam. Mosaic attenuation of the lungs favors air trapping though a component of pulmonary edema is not excluded. Redemonstrated pulmonary nodules are unchanged measuring up to 7 mm in the right lower lobe (series 7/image 65) and 6 mm in the right middle lobe (7/68). No pleural effusion or  pneumothorax. Upper Abdomen: No acute abnormality. Musculoskeletal: No acute fracture. Review of the MIP images confirms the above findings. IMPRESSION: 1. Negative for acute pulmonary embolism. 2. Similar atelectasis or consolidation in the left lower lobe. 3. Mosaic attenuation of the lungs favors air trapping from bronchiolitis though a component of pulmonary edema is not excluded. 4. Unchanged pulmonary nodules measuring up to 7 mm in the right lower lobe. Aortic Atherosclerosis (ICD10-I70.0). Electronically Signed   By: Minerva Fester M.D.   On: 03/20/2023 00:46   CT HEAD WO CONTRAST  Result Date: 03/20/2023 CLINICAL DATA:  Mental status change, unknown cause EXAM: CT HEAD WITHOUT CONTRAST TECHNIQUE: Contiguous axial images were obtained from the base of the skull through the vertex without intravenous contrast. RADIATION DOSE REDUCTION: This exam was performed according to the departmental dose-optimization program which includes automated exposure control, adjustment of the mA and/or kV according to patient size and/or use of iterative reconstruction technique. COMPARISON:  CT head 01/10/2021 FINDINGS: Brain: Patchy and confluent areas of decreased attenuation are noted throughout the deep and periventricular white matter of the cerebral hemispheres bilaterally, compatible with chronic microvascular ischemic disease. No evidence of large-territorial acute infarction. No parenchymal hemorrhage. No mass lesion. No extra-axial collection. No mass effect or midline shift. No hydrocephalus. Basilar cisterns are patent. Vascular: No hyperdense vessel. Skull: No acute fracture or focal lesion. Sinuses/Orbits: Paranasal sinuses and mastoid air cells are clear. Bilateral lens replacement. Otherwise the orbits are unremarkable. Other: None. IMPRESSION: No acute intracranial abnormality. Electronically Signed   By: Tish Frederickson M.D.   On: 03/20/2023 00:40   DG Chest 1 View  Result Date: 03/19/2023 CLINICAL  DATA:  Altered mental status. EXAM: CHEST  1 VIEW  COMPARISON:  December 15, 2021 FINDINGS: There is stable mild to moderate severity cardiac silhouette enlargement. Marked severity calcification of the aortic arch is seen. Low lung volumes are noted with mild, diffuse, chronic appearing increased interstitial lung markings. Mild areas of scarring and/or atelectasis are seen within the bilateral lung bases. There is a small left pleural effusion. No pneumothorax is identified. No acute osseous abnormalities are identified. IMPRESSION: 1. Cardiomegaly and low lung volumes with mild bibasilar scarring and/or atelectasis. 2. Small left pleural effusion. Electronically Signed   By: Aram Candela M.D.   On: 03/19/2023 21:41      IMPRESSION AND PLAN:  Assessment and Plan: * Sepsis due to pneumonia [facility]) - The patient will be admitted to a medical telemetry bed. - Sepsis manifested by tachycardia and tachypnea with leukocytosis. - Will continue antibiotic therapy with IV Rocephin and Zithromax. - Mucolytic therapy be provided as well as duo nebs q.i.d. and q.4 hours p.r.n. - We will follow blood cultures.   Acute lower UTI - This could be contributing to her sepsis. - Urine and blood cultures will be followed as mentioned above. - We will place her on IV Rocephin as mentioned above.  Acute metabolic encephalopathy - This is associated with mild delirium. - Management as above. - We will follow mental status.  Essential hypertension - We will continue antihypertensive therapy.  Type 2 diabetes mellitus with peripheral neuropathy (HCC) - The patient will be placed on supplemental coverage with NovoLog. - We will continue glipizide ER while monitoring for hypoglycemia. - Will hold off metformin. - We will continue Neurontin.  Hyperthyroidism - We will continue methimazole and check TSH.       DVT prophylaxis: Lovenox.  Advanced Care Planning:  Code Status: Patient is DNR and  DNI. Family Communication:  The plan of care was discussed in details with the patient (and family). I answered all questions. The patient agreed to proceed with the above mentioned plan. Further management will depend upon hospital course. Disposition Plan: Back to previous home environment Consults called: none. All the records are reviewed and case discussed with ED provider.  Status is: Inpatient    At the time of the admission, it appears that the appropriate admission status for this patient is inpatient.  This is judged to be reasonable and necessary in order to provide the required intensity of service to ensure the patient's safety given the presenting symptoms, physical exam findings and initial radiographic and laboratory data in the context of comorbid conditions.  The patient requires inpatient status due to high intensity of service, high risk of further deterioration and high frequency of surveillance required.  I certify that at the time of admission, it is my clinical judgment that the patient will require inpatient hospital care extending more than 2 midnights.                            Dispo: The patient is from: Home              Anticipated d/c is to: Home              Patient currently is not medically stable to d/c.              Difficult to place patient: No  Hannah Beat M.D on 03/20/2023 at 8:18 AM  [facility]   From 7 PM-7 AM, contact night-coverage [email or web address]  CC: Primary care physician; Sydnee Cabal,  Benetta Spar, MD

## 2023-03-20 NOTE — ED Notes (Signed)
Pt. Increased agitation and restlessness. Pt. Is calling out, attempting to remove monitoring, lines and mitts. Pt. Yelling. This RN attempting to deescalate pt. Pt. Will respond to her name, and converse, but shortly after returns to agitated behavior.

## 2023-03-20 NOTE — Sepsis Progress Note (Addendum)
Elink monitoring for the code sepsis protocol.   Blood cultures drawn 4hrs prior to code sepsis being initiated.

## 2023-03-20 NOTE — ED Provider Notes (Signed)
-----------------------------------------   12:55 AM on 03/20/2023 -----------------------------------------   Patient spiked fever to 103 F.  Rigors noted.  CT head negative for ICH.  CTA chest markable for left lower lobe consolidation.  Rectal Tylenol given.  ED code sepsis orders initiated with 30 cc/kilo IV fluids and broad-spectrum IV antibiotics.  Will consult hospitalist services for evaluation and admission.   Irean Hong, MD 03/20/23 [phone]

## 2023-03-20 NOTE — Assessment & Plan Note (Signed)
-   The patient will be placed on supplemental coverage with NovoLog. - We will continue glipizide ER while monitoring for hypoglycemia. - Will hold off metformin. - We will continue Neurontin.

## 2023-03-20 NOTE — Progress Notes (Signed)
CODE SEPSIS - PHARMACY COMMUNICATION  **Broad Spectrum Antibiotics should be administered within 1 hour of Sepsis diagnosis**  Time Code Sepsis Called/Page Received: 1610  Antibiotics Ordered: Azithromycin, Cefepime, & Vancomycin  Time of 1st antibiotic administration: 0109  Otelia Sergeant, PharmD, MBA 03/20/2023 1:11 AM

## 2023-03-20 NOTE — ED Notes (Signed)
Dr. Arville Care made aware of vitals/ no new orders at this time

## 2023-03-20 NOTE — Progress Notes (Signed)
PHARMACY -  BRIEF ANTIBIOTIC NOTE   Pharmacy has received consult(s) for Cefepime & Vancomycin from an ED provider.  The patient's profile has been reviewed for ht / wt / allergies / indication / available labs.  Pt with h/o only GI upset when taking amoxicillin and cefuroxime.  One time order(s) placed for: Cefepime 2 gm & Vancomycin 1500 mg per pt wt: 70.9 kg from 02/24/23  Further antibiotics/pharmacy consults should be ordered by admitting physician if indicated.                       Thank you, Otelia Sergeant, PharmD, [facility] 03/20/2023 1:08 AM

## 2023-03-20 NOTE — ED Notes (Signed)
Pts brief, and pad changed/ pericare performed/

## 2023-03-20 NOTE — ED Notes (Signed)
Pts brief and pad changed, pericare performed

## 2023-03-20 NOTE — Assessment & Plan Note (Signed)
-   We will continue methimazole and check TSH.

## 2023-03-20 NOTE — ED Notes (Signed)
Dr. Arville Care aware of HR

## 2023-03-20 NOTE — ED Notes (Signed)
Pts brief, linen, pad changed, pericare performed / pt has moderate amount of loose stool/ MD made aware

## 2023-03-20 NOTE — Hospital Course (Addendum)
   Brief Narrative:  89 year old with history of anxiety, depression, DM 2, GERD, HTN, HLD comes to the ED with altered mental status, confusion, hypoxia.  Upon admission had mild lactic acidosis, leukocytosis with concerns of sepsis secondary to pneumonia and urinary tract infection.  Started on Rocephin and azithromycin.   Assessment & Plan:  Principal Problem:   Sepsis due to pneumonia [facility]) Active Problems:   Acute lower UTI   Acute metabolic encephalopathy   Hyperthyroidism   Type 2 diabetes mellitus with peripheral neuropathy (HCC)   Essential hypertension    Sepsis due to pneumonia (HCC) and urinary tract infection Patient still remains tachycardic and febrile.  Follow culture data. COVID/Flu, Resp Panel, ProCal 0.17 On Roc/Azi IS, flutter, nebs.    Acute metabolic encephalopathy Likely from underlying infection. If no improvement in next 24 hrs, will get EEG and MRI B with and without contrast.  CTH is neg, TSH/Ammonia = normal.   History of breast cancer status postlumpectomy 2019 History of sarcoma - Follows outpatient oncology  Factor V Leiden homozygous -Per oncology on lifelong Pradaxa 150 mg twice daily   Essential hypertension - We will continue antihypertensive therapy.   Type 2 diabetes mellitus with peripheral neuropathy (HCC) Hold PO meds, ISS and accuchecks.    Hyperthyroidism TSH, Methomazole.   GERD PPI   DVT prophylaxis: dabigatran (PRADAXA) capsule 150 mg  Code Status: DNR Family Communication:  Daughter updated.  Status is: Inpatient Still confused, cont hospital stay    Subjective: Remains minimally responsive. Wake up to sound and stimuli.    Examination:  General exam: Appears calm and comfortable, pupils reactive bilaterally Respiratory system: Clear to auscultation. Respiratory effort normal. Cardiovascular system: S1 & S2 heard, RRR. No JVD, murmurs, rubs, gallops or clicks. No pedal edema. Gastrointestinal system: Abdomen  is nondistended, soft and nontender. No organomegaly or masses felt. Normal bowel sounds heard. Central nervous system: Alert to name only. Moves to stimuli.  Extremities: Symmetric 5 x 5 power. Skin: No rashes, lesions or ulcers Psychiatry: diffucult to assess

## 2023-03-20 NOTE — ED Notes (Addendum)
This RN spoke with Randal Buba, pt's daughter, updated on POC and pt. Condition. Will let Dr. Nelson Chimes know that she has her phone on her now and he can call her.

## 2023-03-20 NOTE — Assessment & Plan Note (Signed)
-   We will continue anti-hypertensive therapy. 

## 2023-03-20 NOTE — ED Notes (Signed)
Spoke with tele monitoring, patrick. Confirmed on monitoring.

## 2023-03-20 NOTE — Progress Notes (Signed)
PROGRESS NOTE    Julie Acosta  NWG:[id number] DOB: 1934-02-29 DOA: 03/19/2023 PCP: Earnestine Mealing, MD      Brief Narrative:  89 year old with history of anxiety, depression, DM 2, GERD, HTN, HLD comes to the ED with altered mental status, confusion, hypoxia.  Upon admission had mild lactic acidosis, leukocytosis with concerns of sepsis secondary to pneumonia and urinary tract infection.  Started on Rocephin and azithromycin.   Assessment & Plan:  Principal Problem:   Sepsis due to pneumonia [facility]) Active Problems:   Acute lower UTI   Acute metabolic encephalopathy   Hyperthyroidism   Type 2 diabetes mellitus with peripheral neuropathy (HCC)   Essential hypertension    Sepsis due to pneumonia (HCC) and urinary tract infection Patient still remains tachycardic and febrile.  Follow culture data. COVID/Flu, Resp Panel, ProCal 0.17 On Roc/Azi IS, flutter, nebs.     Acute metabolic encephalopathy Likely from underlying infection. If no improvement in next 24 hrs, will get EEG and MRI B with and without contrast.  CTH is neg, TSH/Ammonia = normal.   History of breast cancer status postlumpectomy 2019 History of sarcoma - Follows outpatient oncology  Factor V Leiden homozygous -Per oncology on lifelong Pradaxa 150 mg twice daily   Essential hypertension - We will continue antihypertensive therapy.   Type 2 diabetes mellitus with peripheral neuropathy (HCC) Hold PO meds, ISS and accuchecks.    Hyperthyroidism TSH, Methomazole.   GERD PPI   DVT prophylaxis: dabigatran (PRADAXA) capsule 150 mg  Code Status: DNR Family Communication:  Daughter updated.  Status is: Inpatient Still confused, cont hospital stay    Subjective: Remains minimally responsive. Wake up to sound and stimuli.    Examination:  General exam: Appears calm and comfortable, pupils reactive bilaterally Respiratory system: Clear to auscultation. Respiratory effort normal. Cardiovascular  system: S1 & S2 heard, RRR. No JVD, murmurs, rubs, gallops or clicks. No pedal edema. Gastrointestinal system: Abdomen is nondistended, soft and nontender. No organomegaly or masses felt. Normal bowel sounds heard. Central nervous system: Alert to name only. Moves to stimuli.  Extremities: Symmetric 5 x 5 power. Skin: No rashes, lesions or ulcers Psychiatry: diffucult to assess          Diet Orders (From admission, onward)     Start     Ordered   03/20/23 0833  Diet Carb Modified Fluid consistency: Thin; Room service appropriate? Yes  Diet effective now       Question Answer Comment  Diet-HS Snack? Nothing   Calorie Level Medium 1600-2000   Fluid consistency: Thin   Room service appropriate? Yes      03/20/23 0832            Objective: Vitals:   03/20/23 1130 03/20/23 1145 03/20/23 1200 03/20/23 1215  BP: (!) 116/54  (!) 97/47 (!) 111/36  Pulse: (!) 123 100 (!) 104 (!) 109  Resp: (!) 26 (!) 25 19 (!) 22  Temp: (!) 100.7 F (38.2 C) (!) 100.7 F (38.2 C) (!) 100.7 F (38.2 C) (!) 100.7 F (38.2 C)  TempSrc:      SpO2: 91% 94% 97% 98%  Weight:      Height:        Intake/Output Summary (Last 24 hours) at 03/20/2023 1349 Last data filed at 03/20/2023 1227 Gross per 24 hour  Intake 1399.63 ml  Output --  Net 1399.63 ml   Filed Weights   03/20/23 0400 03/20/23 0447  Weight: 72 kg 72 kg  Scheduled Meds:  acetaminophen  650 mg Rectal Q6H   cholecalciferol  1,000 Units Oral Daily   dabigatran  150 mg Oral Q12H   fluticasone  2 spray Each Nare BID   gabapentin  100 mg Oral TID   insulin aspart  0-15 Units Subcutaneous TID WC   insulin aspart  0-5 Units Subcutaneous QHS   ipratropium-albuterol  3 mL Nebulization Q6H   loratadine  10 mg Oral Daily   melatonin  2.5 mg Oral QHS   methimazole  10 mg Oral Daily   metoprolol tartrate  50 mg Oral BID   pantoprazole  40 mg Oral Daily   PARoxetine  30 mg Oral Daily   Continuous Infusions:  azithromycin      ceFEPime (MAXIPIME) IV Stopped (03/20/23 1207)   dextrose 5 % and 0.45 % NaCl      Nutritional status     Body mass index is 28.12 kg/m.  Data Reviewed:   CBC: Recent Labs  Lab 03/19/23 1849  WBC 13.1*  NEUTROABS 9.8*  HGB 12.6  HCT 40.5  MCV 91.4  PLT 244   Basic Metabolic Panel: Recent Labs  Lab 03/19/23 1849  NA 136  K 4.4  CL 101  CO2 26  GLUCOSE 135*  BUN 15  CREATININE 0.59  CALCIUM 9.7   GFR: Estimated Creatinine Clearance: 48 mL/min (by C-G formula based on SCr of 0.59 mg/dL). Liver Function Tests: Recent Labs  Lab 03/19/23 1849  AST 24  ALT 11  ALKPHOS 59  BILITOT 1.2*  PROT 6.9  ALBUMIN 3.4*   No results for input(s): "LIPASE", "AMYLASE" in the last 168 hours. Recent Labs  Lab 03/20/23 1034  AMMONIA 31   Coagulation Profile: Recent Labs  Lab 03/19/23 1950 03/20/23 0422 03/20/23 0626  INR 1.1 1.2 1.2   Cardiac Enzymes: No results for input(s): "CKTOTAL", "CKMB", "CKMBINDEX", "TROPONINI" in the last 168 hours. BNP (last 3 results) No results for input(s): "PROBNP" in the last 8760 hours. HbA1C: No results for input(s): "HGBA1C" in the last 72 hours. CBG: Recent Labs  Lab 03/20/23 0855 03/20/23 1218  GLUCAP 148* 143*   Lipid Profile: No results for input(s): "CHOL", "HDL", "LDLCALC", "TRIG", "CHOLHDL", "LDLDIRECT" in the last 72 hours. Thyroid Function Tests: Recent Labs    03/20/23 0856 03/20/23 1034  TSH 0.307*  --   FREET4  --  0.84   Anemia Panel: No results for input(s): "VITAMINB12", "FOLATE", "FERRITIN", "TIBC", "IRON", "RETICCTPCT" in the last 72 hours. Sepsis Labs: Recent Labs  Lab 03/19/23 1950 03/20/23 0422 03/20/23 0627 03/20/23 0856  PROCALCITON  --   --   --  0.17  LATICACIDVEN 2.1* 2.1* 1.8 1.5    Recent Results (from the past 240 hour(s))  Culture, blood (Routine x 2)     Status: None (Preliminary result)   Collection Time: 03/19/23  6:50 PM   Specimen: BLOOD  Result Value Ref Range Status    Specimen Description BLOOD LEFT ANTECUBITAL  Final   Special Requests   Final    BOTTLES DRAWN AEROBIC AND ANAEROBIC Blood Culture adequate volume   Culture   Final    NO GROWTH < 12 HOURS Performed at [facility], [address] [place], [place] [address]    Report Status PENDING  Incomplete  Culture, blood (Routine x 2)     Status: None (Preliminary result)   Collection Time: 03/19/23  7:50 PM   Specimen: BLOOD  Result Value Ref Range Status   Specimen  Description BLOOD BLOOD RIGHT ARM  Final   Special Requests   Final    BOTTLES DRAWN AEROBIC AND ANAEROBIC Blood Culture results may not be optimal due to an inadequate volume of blood received in culture bottles   Culture   Final    NO GROWTH < 12 HOURS Performed at [facility], [address] [place], [place] [address]    Report Status PENDING  Incomplete  C Difficile Quick Screen w PCR reflex     Status: None   Collection Time: 03/20/23  1:36 AM   Specimen: STOOL  Result Value Ref Range Status   C Diff antigen NEGATIVE NEGATIVE Final   C Diff toxin NEGATIVE NEGATIVE Final   C Diff interpretation No C. difficile detected.  Final    Comment: Performed at [facility], [address] [place], [place] [address]  Gastrointestinal Panel by PCR , Stool     Status: None   Collection Time: 03/20/23  1:36 AM   Specimen: STOOL  Result Value Ref Range Status   Campylobacter species NOT DETECTED NOT DETECTED Final   Plesimonas shigelloides NOT DETECTED NOT DETECTED Final   Salmonella species NOT DETECTED NOT DETECTED Final   Yersinia enterocolitica NOT DETECTED NOT DETECTED Final   Vibrio species NOT DETECTED NOT DETECTED Final   Vibrio cholerae NOT DETECTED NOT DETECTED Final   Enteroaggregative E coli (EAEC) NOT DETECTED NOT DETECTED Final   Enteropathogenic E coli (EPEC) NOT DETECTED NOT DETECTED Final   Enterotoxigenic E coli (ETEC) NOT DETECTED NOT DETECTED Final   Shiga like toxin producing E  coli (STEC) NOT DETECTED NOT DETECTED Final   Shigella/Enteroinvasive E coli (EIEC) NOT DETECTED NOT DETECTED Final   Cryptosporidium NOT DETECTED NOT DETECTED Final   Cyclospora cayetanensis NOT DETECTED NOT DETECTED Final   Entamoeba histolytica NOT DETECTED NOT DETECTED Final   Giardia lamblia NOT DETECTED NOT DETECTED Final   Adenovirus F40/41 NOT DETECTED NOT DETECTED Final   Astrovirus NOT DETECTED NOT DETECTED Final   Norovirus GI/GII NOT DETECTED NOT DETECTED Final   Rotavirus A NOT DETECTED NOT DETECTED Final   Sapovirus (I, II, IV, and V) NOT DETECTED NOT DETECTED Final    Comment: Performed at [facility], [address] [place], [place] [address]         Radiology Studies: CT Angio Chest PE W/Cm &/Or Wo Cm  Result Date: 03/20/2023 CLINICAL DATA:  89 year old female with confusion presenting with altered mental status and hypoxia. PE suspected. EXAM: CT ANGIOGRAPHY CHEST WITH CONTRAST TECHNIQUE: Multidetector CT imaging of the chest was performed using the standard protocol during bolus administration of intravenous contrast. Multiplanar CT image reconstructions and MIPs were obtained to evaluate the vascular anatomy. RADIATION DOSE REDUCTION: This exam was performed according to the departmental dose-optimization program which includes automated exposure control, adjustment of the mA and/or kV according to patient size and/or use of iterative reconstruction technique. CONTRAST:  OMNIPAQUE IOHEXOL 350 MG/ML SOLN COMPARISON:  Chest radiograph earlier today and CT 06/27/2022 FINDINGS: Cardiovascular: Cardiomegaly. No pericardial effusion. Mitral annular calcification. Aortic and coronary artery calcification. Negative for acute pulmonary embolism. Mediastinum/Nodes: Trachea and esophagus are unremarkable. No thoracic adenopathy. Lungs/Pleura: Similar atelectasis or consolidation in the left lower lobe. Expiratory phase exam. Mosaic attenuation of the lungs favors air  trapping though a component of pulmonary edema is not excluded. Redemonstrated pulmonary nodules are unchanged measuring up to 7 mm in the right lower lobe (series 7/image 65) and 6 mm in  the right middle lobe (7/68). No pleural effusion or pneumothorax. Upper Abdomen: No acute abnormality. Musculoskeletal: No acute fracture. Review of the MIP images confirms the above findings. IMPRESSION: 1. Negative for acute pulmonary embolism. 2. Similar atelectasis or consolidation in the left lower lobe. 3. Mosaic attenuation of the lungs favors air trapping from bronchiolitis though a component of pulmonary edema is not excluded. 4. Unchanged pulmonary nodules measuring up to 7 mm in the right lower lobe. Aortic Atherosclerosis (ICD10-I70.0). Electronically Signed   By: Minerva Fester M.D.   On: 03/20/2023 00:46   CT HEAD WO CONTRAST  Result Date: 03/20/2023 CLINICAL DATA:  Mental status change, unknown cause EXAM: CT HEAD WITHOUT CONTRAST TECHNIQUE: Contiguous axial images were obtained from the base of the skull through the vertex without intravenous contrast. RADIATION DOSE REDUCTION: This exam was performed according to the departmental dose-optimization program which includes automated exposure control, adjustment of the mA and/or kV according to patient size and/or use of iterative reconstruction technique. COMPARISON:  CT head 01/10/2021 FINDINGS: Brain: Patchy and confluent areas of decreased attenuation are noted throughout the deep and periventricular white matter of the cerebral hemispheres bilaterally, compatible with chronic microvascular ischemic disease. No evidence of large-territorial acute infarction. No parenchymal hemorrhage. No mass lesion. No extra-axial collection. No mass effect or midline shift. No hydrocephalus. Basilar cisterns are patent. Vascular: No hyperdense vessel. Skull: No acute fracture or focal lesion. Sinuses/Orbits: Paranasal sinuses and mastoid air cells are clear. Bilateral lens  replacement. Otherwise the orbits are unremarkable. Other: None. IMPRESSION: No acute intracranial abnormality. Electronically Signed   By: Tish Frederickson M.D.   On: 03/20/2023 00:40   DG Chest 1 View  Result Date: 03/19/2023 CLINICAL DATA:  Altered mental status. EXAM: CHEST  1 VIEW COMPARISON:  December 15, 2021 FINDINGS: There is stable mild to moderate severity cardiac silhouette enlargement. Marked severity calcification of the aortic arch is seen. Low lung volumes are noted with mild, diffuse, chronic appearing increased interstitial lung markings. Mild areas of scarring and/or atelectasis are seen within the bilateral lung bases. There is a small left pleural effusion. No pneumothorax is identified. No acute osseous abnormalities are identified. IMPRESSION: 1. Cardiomegaly and low lung volumes with mild bibasilar scarring and/or atelectasis. 2. Small left pleural effusion. Electronically Signed   By: Aram Candela M.D.   On: 03/19/2023 21:41           LOS: 0 days   Time spent= 35 mins    Miguel Rota, MD [facility]  If 7PM-7AM, please contact night-coverage  03/20/2023, 1:49 PM

## 2023-03-20 NOTE — ED Notes (Signed)
Mitts placed to bil hands d/t pt trying to pull on IV and IV tubing

## 2023-03-20 NOTE — Assessment & Plan Note (Signed)
-   This could be contributing to her sepsis. - Urine and blood cultures will be followed as mentioned above. - We will place her on IV Rocephin as mentioned above.

## 2023-03-20 NOTE — Assessment & Plan Note (Signed)
-   The patient will be admitted to a medical telemetry bed. - Sepsis manifested by tachycardia and tachypnea with leukocytosis. - Will continue antibiotic therapy with IV Rocephin and Zithromax. - Mucolytic therapy be provided as well as duo nebs q.i.d. and q.4 hours p.r.n. - We will follow blood cultures.

## 2023-03-20 NOTE — Assessment & Plan Note (Signed)
-   This is associated with mild delirium. - Management as above. - We will follow mental status.

## 2023-03-21 ENCOUNTER — Inpatient Hospital Stay: Payer: Medicare Other

## 2023-03-21 DIAGNOSIS — J189 Pneumonia, unspecified organism: Secondary | ICD-10-CM | POA: Diagnosis not present

## 2023-03-21 DIAGNOSIS — A419 Sepsis, unspecified organism: Secondary | ICD-10-CM | POA: Diagnosis not present

## 2023-03-21 DIAGNOSIS — I4891 Unspecified atrial fibrillation: Secondary | ICD-10-CM | POA: Diagnosis not present

## 2023-03-21 LAB — CBC
HCT: 35.5 % — ABNORMAL LOW (ref 36.0–46.0)
Hemoglobin: 11.5 g/dL — ABNORMAL LOW (ref 12.0–15.0)
MCH: 28.2 pg (ref 26.0–34.0)
MCHC: 32.4 g/dL (ref 30.0–36.0)
MCV: 87 fL (ref 80.0–100.0)
Platelets: 210 10*3/uL (ref 150–400)
RBC: 4.08 MIL/uL (ref 3.87–5.11)
RDW: 14.6 % (ref 11.5–15.5)
WBC: 10.8 10*3/uL — ABNORMAL HIGH (ref 4.0–10.5)
nRBC: 0 % (ref 0.0–0.2)

## 2023-03-21 LAB — COMPREHENSIVE METABOLIC PANEL
ALT: 10 U/L (ref 0–44)
AST: 18 U/L (ref 15–41)
Albumin: 2.8 g/dL — ABNORMAL LOW (ref 3.5–5.0)
Alkaline Phosphatase: 45 U/L (ref 38–126)
Anion gap: 10 (ref 5–15)
BUN: 13 mg/dL (ref 8–23)
CO2: 26 mmol/L (ref 22–32)
Calcium: 8.6 mg/dL — ABNORMAL LOW (ref 8.9–10.3)
Chloride: 99 mmol/L (ref 98–111)
Creatinine, Ser: 0.48 mg/dL (ref 0.44–1.00)
GFR, Estimated: >60 mL/min (ref >60)
Glucose, Bld: 218 mg/dL — ABNORMAL HIGH (ref 70–99)
Potassium: 2.9 mmol/L — ABNORMAL LOW (ref 3.5–5.1)
Sodium: 135 mmol/L (ref 135–145)
Total Bilirubin: 0.4 mg/dL (ref <1.2)
Total Protein: 5.7 g/dL — ABNORMAL LOW (ref 6.5–8.1)

## 2023-03-21 LAB — GLUCOSE, CAPILLARY
Glucose-Capillary: 185 mg/dL — ABNORMAL HIGH (ref 70–99)
Glucose-Capillary: 145 mg/dL — ABNORMAL HIGH (ref 70–99)
Glucose-Capillary: 194 mg/dL — ABNORMAL HIGH (ref 70–99)
Glucose-Capillary: 139 mg/dL — ABNORMAL HIGH (ref 70–99)

## 2023-03-21 LAB — MAGNESIUM: Magnesium: 1.1 mg/dL — ABNORMAL LOW (ref 1.7–2.4)

## 2023-03-21 LAB — CK: Total CK: 223 U/L (ref 38–234)

## 2023-03-21 LAB — PHOSPHORUS: Phosphorus: 2.6 mg/dL (ref 2.5–4.6)

## 2023-03-21 LAB — FOLATE: Folate: 12.1 ng/mL (ref >5.9)

## 2023-03-21 MED ORDER — MAGNESIUM SULFATE 2 GM/50ML IV SOLN
2.0000 g | Freq: Once | INTRAVENOUS | Status: AC
  Administered 2023-03-21: 2 g via INTRAVENOUS
  Filled 2023-03-21: qty 50

## 2023-03-21 MED ORDER — SODIUM CHLORIDE 0.9 % IV BOLUS
500.0000 mL | Freq: Once | INTRAVENOUS | Status: AC
  Administered 2023-03-21: 500 mL via INTRAVENOUS

## 2023-03-21 MED ORDER — MIDODRINE HCL 5 MG PO TABS
5.0000 mg | ORAL_TABLET | Freq: Three times a day (TID) | ORAL | Status: DC
  Administered 2023-03-21 – 2023-03-24 (×9): 5 mg via ORAL
  Filled 2023-03-21 (×12): qty 1

## 2023-03-21 MED ORDER — POTASSIUM CHLORIDE 10 MEQ/100ML IV SOLN
10.0000 meq | INTRAVENOUS | Status: AC
Start: 2023-03-21 — End: 2023-03-21
  Administered 2023-03-21 (×4): 10 meq via INTRAVENOUS
  Filled 2023-03-21: qty 100

## 2023-03-21 MED ORDER — MAGNESIUM SULFATE 4 GM/100ML IV SOLN
4.0000 g | Freq: Once | INTRAVENOUS | Status: AC
  Administered 2023-03-21: 4 g via INTRAVENOUS
  Filled 2023-03-21: qty 100

## 2023-03-21 NOTE — Progress Notes (Addendum)
       CROSS COVER NOTE  NAME: Julie Acosta MRN: [id number] DOB : 05/21/1933    Concern as stated by nurse / staff   Secure chat from Julie Rhymes RN Pt admitted for Sepsis d/t PNA and UTI. Pt was apparently extremely agitated in the ED and was given geodon. Pt has been extremely lethargic since being brought to the unit. Pt's last blood pressure was 98/44 and her heart rate was 110. Her last drawn lactic was "1.0". Pt will open her eyes briefly but resumes sleep almost immediately. Her MEWS is now red.     Pertinent findings on chart review:  89 year old with history of anxiety, depression, DM 2, GERD, HTN, HLD comes to the ED with altered mental status, confusion, hypoxia.  Upon admission had mild lactic acidosis, leukocytosis with concerns of sepsis secondary to pneumonia and urinary tract infection.  Started on Rocephin and azithromycin.   Assessment and  Interventions   Assessment:    03/21/2023   12:44 AM 03/20/2023   10:28 PM 03/20/2023    8:02 PM  Vitals with BMI  Systolic 98 118 98  Diastolic 64 58 44  Pulse 104 111 110   Temp 99.1 axillary Prior provider assessment: Weak, minimally responsive, protecting airway Patient partially with eyes open and awake with name called. Slurred speech as at baseline. Sleepily answered simple questions appropriately  VBG  Latest Reference Range & Units 03/20/23 21:30  pH, Ven 7.25 - 7.43  7.4  pCO2, Ven 44 - 60 mmHg 49  pO2, Ven 32 - 45 mmHg 62 (H)  Acid-Base Excess 0.0 - 2.0 mmol/L 4.5 (H)  Bicarbonate 20.0 - 28.0 mmol/L 30.4 (H)  O2 Saturation % 93.3  Patient temperature  37.0  Collection site  VEIN  (H): Data is abnormally high Plan: Continue to monitor     Donnie Mesa NP [facility] Cross Cover 7pm-7am - check amion for availability Pager [phone]  Update 0645 AM Patient reports headache without concerning features reported.  Defer additional meds other than her scheduled  acetaminophen at this time Mag and K replacements ordered Tmax over night 100.4 rectal Consider CNS sources for septic physiology

## 2023-03-21 NOTE — Progress Notes (Signed)
OT Cancellation Note  Patient Details Name: ROSEMARI TRAEGER MRN: [id number] DOB: Jul 04, 1933   Cancelled Treatment:    Reason Eval/Treat Not Completed: Medical issues which prohibited therapy Chart review done - Upon checking on pt medical status with nursing- report BP low and HR up and down - to please hold off until tomorrow with OT eval,   Oletta Cohn OTR/L,CLT 03/21/2023, 2:44 PM

## 2023-03-21 NOTE — Progress Notes (Signed)
PHARMACY CONSULT NOTE - FOLLOW UP  Pharmacy Consult for Electrolyte Monitoring and Replacement   Recent Labs: Potassium (mmol/L)  Date Value  03/21/2023 2.9 (L)  07/01/2013 4.6   Magnesium (mg/dL)  Date Value  16/02/9603 1.1 (L)   Calcium (mg/dL)  Date Value  54/01/8118 8.6 (L)   Calcium, Total (PTH) (mg/dL)  Date Value  14/78/2956 11.9 (H)   Albumin (g/dL)  Date Value  21/30/8657 2.8 (L)  07/01/2013 3.6   Phosphorus (mg/dL)  Date Value  84/69/6295 2.6   Sodium  Date Value  03/21/2023 135 mmol/L  10/16/2022 139  07/01/2013 139 mmol/L     Assessment: 89 y.o. Caucasian female with medical history significant for anxiety, depression, type 2 diabetes mellitus, GERD, hypertension and dyslipidemia, presented to the emergency room with acute onset of altered mental status with confusion, restlessness and occasional agitation as well as hypoxia at her SNF.     Goal of Therapy:  Electrolytes WNL  Plan:  ---6 grams IV magnesium sulfate x 1 per MD ---10 mEq IV potassium chloride x 4 ---recheck electrolytes in am  Lowella Bandy ,PharmD Clinical Pharmacist 03/21/2023 8:08 AM

## 2023-03-21 NOTE — Progress Notes (Signed)
PROGRESS NOTE    Julie Acosta  AOZ:[id number] DOB: [birth year] DOA: 03/19/2023 PCP: Earnestine Mealing, MD      Brief Narrative:  89 year old with history of anxiety, depression, DM 2, GERD, HTN, HLD comes to the ED with altered mental status, confusion, hypoxia.  Upon admission had mild lactic acidosis, leukocytosis with concerns of sepsis secondary to pneumonia and urinary tract infection.  Started on Rocephin and azithromycin.   Assessment & Plan:  Principal Problem:   Sepsis due to pneumonia [facility]) Active Problems:   Acute lower UTI   Acute metabolic encephalopathy   Hyperthyroidism   Type 2 diabetes mellitus with peripheral neuropathy (HCC)   Essential hypertension    Sepsis due to pneumonia (HCC) and urinary tract infection Fever curve slightly better. COVID/Flu/Resp Panel-negative, ProCal 0.17.  Blood cultures-NGTD On Roc/Azi IS, flutter, nebs.    Acute metabolic encephalopathy; improving.  Likely from underlying infection.Improving.  CTH is neg, TSH/Ammonia/B12/folate = normal.   Hypokalemia/hypomagnesemia - Pharmacy pharmacy consulted to monitor and replete electrolytes  History of breast cancer status postlumpectomy 2019 History of sarcoma - Follows outpatient oncology  Factor V Leiden homozygous -Per oncology on lifelong Pradaxa 150 mg twice daily   Essential hypertension - We will continue antihypertensive therapy.   Type 2 diabetes mellitus with peripheral neuropathy (HCC) Hold PO meds, ISS and accuchecks.  A1c 6.5   Hyperthyroidism TSH, Methomazole.   GERD PPI  PT/OT= wheelchair bound but does do basic movements.    DVT prophylaxis: dabigatran (PRADAXA) capsule 150 mg  Code Status: DNR Family Communication:  Daughter updated.  Status is: Inpatient Abnormal electrolytes. cont hospital stay   Subjective: Doing better.  More awake today, answers basic questions.   Examination:  General exam: Appears calm and comfortable, pupils  reactive bilaterally Respiratory system: Clear to auscultation. Respiratory effort normal. Cardiovascular system: S1 & S2 heard, RRR. No JVD, murmurs, rubs, gallops or clicks. No pedal edema. Gastrointestinal system: Abdomen is nondistended, soft and nontender. No organomegaly or masses felt. Normal bowel sounds heard. Central nervous system: Alert to name only. Moves to stimuli.  Extremities: Symmetric 5 x 5 power. Skin: No rashes, lesions or ulcers Psychiatry: diffucult to assess                Diet Orders (From admission, onward)     Start     Ordered   03/20/23 0833  Diet Carb Modified Fluid consistency: Thin; Room service appropriate? Yes  Diet effective now       Question Answer Comment  Diet-HS Snack? Nothing   Calorie Level Medium 1600-2000   Fluid consistency: Thin   Room service appropriate? Yes      03/20/23 0832            Objective: Vitals:   03/21/23 0732 03/21/23 0748 03/21/23 1017 03/21/23 1201  BP:  127/65 (!) 111/55 (!) 92/52  Pulse:  (!) 107  89  Resp:  19    Temp:  98.1 F (36.7 C)  99.1 F (37.3 C)  TempSrc:      SpO2: 95% 90% 92% 96%  Weight:      Height:        Intake/Output Summary (Last 24 hours) at 03/21/2023 1323 Last data filed at 03/21/2023 0210 Gross per 24 hour  Intake 1251.81 ml  Output --  Net 1251.81 ml   Filed Weights   03/20/23 0400 03/20/23 0447 03/20/23 1717  Weight: 72 kg 72 kg 71 kg    Scheduled Meds:  acetaminophen  650 mg Rectal Q6H   cholecalciferol  1,000 Units Oral Daily   dabigatran  150 mg Oral Q12H   fluticasone  2 spray Each Nare BID   gabapentin  100 mg Oral TID   insulin aspart  0-15 Units Subcutaneous TID WC   insulin aspart  0-5 Units Subcutaneous QHS   loratadine  10 mg Oral Daily   melatonin  2.5 mg Oral QHS   methimazole  10 mg Oral Daily   metoprolol tartrate  50 mg Oral BID   pantoprazole  40 mg Oral Daily   PARoxetine  30 mg Oral Daily   Continuous Infusions:  azithromycin  Stopped (03/21/23 0000)   cefTRIAXone (ROCEPHIN)  IV Stopped (03/21/23 0000)   dextrose 5 % and 0.45 % NaCl Stopped (03/21/23 1050)   potassium chloride 10 mEq (03/21/23 1234)    Nutritional status     Body mass index is 27.73 kg/m.  Data Reviewed:   CBC: Recent Labs  Lab 03/19/23 1849 03/21/23 0502  WBC 13.1* 10.8*  NEUTROABS 9.8*  --   HGB 12.6 11.5*  HCT 40.5 35.5*  MCV 91.4 87.0  PLT 244 210   Basic Metabolic Panel: Recent Labs  Lab 03/19/23 1849 03/21/23 0502  NA 136 135  K 4.4 2.9*  CL 101 99  CO2 26 26  GLUCOSE 135* 218*  BUN 15 13  CREATININE 0.59 0.48  CALCIUM 9.7 8.6*  MG  --  1.1*  PHOS  --  2.6   GFR: Estimated Creatinine Clearance: 47.7 mL/min (by C-G formula based on SCr of 0.48 mg/dL). Liver Function Tests: Recent Labs  Lab 03/19/23 1849 03/21/23 0502  AST 24 18  ALT 11 10  ALKPHOS 59 45  BILITOT 1.2* 0.4  PROT 6.9 5.7*  ALBUMIN 3.4* 2.8*   No results for input(s): "LIPASE", "AMYLASE" in the last 168 hours. Recent Labs  Lab 03/20/23 1034  AMMONIA 31   Coagulation Profile: Recent Labs  Lab 03/19/23 1950 03/20/23 0422 03/20/23 0626  INR 1.1 1.2 1.2   Cardiac Enzymes: Recent Labs  Lab 03/20/23 1244 03/21/23 0502  CKTOTAL 327* 223   BNP (last 3 results) No results for input(s): "PROBNP" in the last 8760 hours. HbA1C: Recent Labs    03/20/23 0856  HGBA1C 6.5*   CBG: Recent Labs  Lab 03/20/23 1218 03/20/23 1712 03/20/23 2044 03/21/23 0742 03/21/23 1203  GLUCAP 143* 117* 204* 185* 145*   Lipid Profile: No results for input(s): "CHOL", "HDL", "LDLCALC", "TRIG", "CHOLHDL", "LDLDIRECT" in the last 72 hours. Thyroid Function Tests: Recent Labs    03/20/23 0856 03/20/23 1034  TSH 0.307*  --   FREET4  --  0.84   Anemia Panel: Recent Labs    03/20/23 1034 03/21/23 0502  VITAMINB12 326  --   FOLATE  --  12.1   Sepsis Labs: Recent Labs  Lab 03/20/23 0422 03/20/23 0627 03/20/23 0856 03/20/23 1950   PROCALCITON  --   --  0.17  --   LATICACIDVEN 2.1* 1.8 1.5 1.0    Recent Results (from the past 240 hour(s))  Culture, blood (Routine x 2)     Status: None (Preliminary result)   Collection Time: 03/19/23  6:50 PM   Specimen: BLOOD  Result Value Ref Range Status   Specimen Description BLOOD LEFT ANTECUBITAL  Final   Special Requests   Final    BOTTLES DRAWN AEROBIC AND ANAEROBIC Blood Culture adequate volume   Culture   Final    NO GROWTH  2 DAYS Performed at [facility], [address] [place], [place] [address]    Report Status PENDING  Incomplete  Culture, blood (Routine x 2)     Status: None (Preliminary result)   Collection Time: 03/19/23  7:50 PM   Specimen: BLOOD  Result Value Ref Range Status   Specimen Description BLOOD BLOOD RIGHT ARM  Final   Special Requests   Final    BOTTLES DRAWN AEROBIC AND ANAEROBIC Blood Culture results may not be optimal due to an inadequate volume of blood received in culture bottles   Culture   Final    NO GROWTH 2 DAYS Performed at [facility], [address], [place], [place] [address]    Report Status PENDING  Incomplete  C Difficile Quick Screen w PCR reflex     Status: None   Collection Time: 03/20/23  1:36 AM   Specimen: STOOL  Result Value Ref Range Status   C Diff antigen NEGATIVE NEGATIVE Final   C Diff toxin NEGATIVE NEGATIVE Final   C Diff interpretation No C. difficile detected.  Final    Comment: Performed at [facility], [address] [place], [place] [address]  Gastrointestinal Panel by PCR , Stool     Status: None   Collection Time: 03/20/23  1:36 AM   Specimen: STOOL  Result Value Ref Range Status   Campylobacter species NOT DETECTED NOT DETECTED Final   Plesimonas shigelloides NOT DETECTED NOT DETECTED Final   Salmonella species NOT DETECTED NOT DETECTED Final   Yersinia enterocolitica NOT DETECTED NOT DETECTED Final   Vibrio species NOT DETECTED NOT DETECTED Final   Vibrio  cholerae NOT DETECTED NOT DETECTED Final   Enteroaggregative E coli (EAEC) NOT DETECTED NOT DETECTED Final   Enteropathogenic E coli (EPEC) NOT DETECTED NOT DETECTED Final   Enterotoxigenic E coli (ETEC) NOT DETECTED NOT DETECTED Final   Shiga like toxin producing E coli (STEC) NOT DETECTED NOT DETECTED Final   Shigella/Enteroinvasive E coli (EIEC) NOT DETECTED NOT DETECTED Final   Cryptosporidium NOT DETECTED NOT DETECTED Final   Cyclospora cayetanensis NOT DETECTED NOT DETECTED Final   Entamoeba histolytica NOT DETECTED NOT DETECTED Final   Giardia lamblia NOT DETECTED NOT DETECTED Final   Adenovirus F40/41 NOT DETECTED NOT DETECTED Final   Astrovirus NOT DETECTED NOT DETECTED Final   Norovirus GI/GII NOT DETECTED NOT DETECTED Final   Rotavirus A NOT DETECTED NOT DETECTED Final   Sapovirus (I, II, IV, and V) NOT DETECTED NOT DETECTED Final    Comment: Performed at [facility], [address] [place], [place] [address]  SARS Coronavirus 2 by RT PCR (hospital order, performed in [facility] hospital lab) *cepheid single result test* Anterior Nasal Swab     Status: None   Collection Time: 03/20/23  1:00 PM   Specimen: Anterior Nasal Swab  Result Value Ref Range Status   SARS Coronavirus 2 by RT PCR NEGATIVE NEGATIVE Final    Comment: (NOTE) SARS-CoV-2 target nucleic acids are NOT DETECTED.  The SARS-CoV-2 RNA is generally detectable in upper and lower respiratory specimens during the acute phase of infection. The lowest concentration of SARS-CoV-2 viral copies this assay can detect is 250 copies / mL. A negative result does not preclude SARS-CoV-2 infection and should not be used as the sole basis for treatment or other patient management decisions.  A negative result may occur with improper specimen collection / handling, submission of specimen other than nasopharyngeal swab, presence of viral  mutation(s) within the areas targeted by this assay, and inadequate number of  viral copies (<250 copies / mL). A negative result must be combined with clinical observations, patient history, and epidemiological information.  Fact Sheet for Patients:   RoadLapTop.co.za  Fact Sheet for Healthcare Providers: http://kim-miller.com/  This test is not yet approved or  cleared by the [place] FDA and has been authorized for detection and/or diagnosis of SARS-CoV-2 by FDA under an Emergency Use Authorization (EUA).  This EUA will remain in effect (meaning this test can be used) for the duration of the COVID-19 declaration under Section 564(b)(1) of the Act, 21 U.S.C. section 360bbb-3(b)(1), unless the authorization is terminated or revoked sooner.  Performed at [facility], [address] [place], [place] [address]   Respiratory (~20 pathogens) panel by PCR     Status: None   Collection Time: 03/20/23  1:00 PM   Specimen: Nasopharyngeal Swab; Respiratory  Result Value Ref Range Status   Adenovirus NOT DETECTED NOT DETECTED Final   Coronavirus 229E NOT DETECTED NOT DETECTED Final    Comment: (NOTE) The Coronavirus on the Respiratory Panel, DOES NOT test for the novel  Coronavirus (2019 nCoV)    Coronavirus HKU1 NOT DETECTED NOT DETECTED Final   Coronavirus NL63 NOT DETECTED NOT DETECTED Final   Coronavirus OC43 NOT DETECTED NOT DETECTED Final   Metapneumovirus NOT DETECTED NOT DETECTED Final   Rhinovirus / Enterovirus NOT DETECTED NOT DETECTED Final   Influenza A NOT DETECTED NOT DETECTED Final   Influenza B NOT DETECTED NOT DETECTED Final   Parainfluenza Virus 1 NOT DETECTED NOT DETECTED Final   Parainfluenza Virus 2 NOT DETECTED NOT DETECTED Final   Parainfluenza Virus 3 NOT DETECTED NOT DETECTED Final   Parainfluenza Virus 4 NOT DETECTED NOT DETECTED Final   Respiratory Syncytial Virus NOT DETECTED NOT DETECTED Final   Bordetella pertussis NOT DETECTED NOT DETECTED Final   Bordetella  Parapertussis NOT DETECTED NOT DETECTED Final   Chlamydophila pneumoniae NOT DETECTED NOT DETECTED Final   Mycoplasma pneumoniae NOT DETECTED NOT DETECTED Final    Comment: Performed at [facility], [address], [place], [place] [address]  MRSA Next Gen by PCR, Nasal     Status: None   Collection Time: 03/20/23  1:00 PM   Specimen: Nasopharyngeal Swab; Nasal Swab  Result Value Ref Range Status   MRSA by PCR Next Gen NOT DETECTED NOT DETECTED Final    Comment: (NOTE) The GeneXpert MRSA Assay (FDA approved for NASAL specimens only), is one component of a comprehensive MRSA colonization surveillance program. It is not intended to diagnose MRSA infection nor to guide or monitor treatment for MRSA infections. Test performance is not FDA approved in patients less than 44 years old. Performed at [facility], [address] [place], [place] [address]          Radiology Studies: CT Angio Chest PE W/Cm &/Or Wo Cm  Result Date: 03/20/2023 CLINICAL DATA:  89 year old female with confusion presenting with altered mental status and hypoxia. PE suspected. EXAM: CT ANGIOGRAPHY CHEST WITH CONTRAST TECHNIQUE: Multidetector CT imaging of the chest was performed using the standard protocol during bolus administration of intravenous contrast. Multiplanar CT image reconstructions and MIPs were obtained to evaluate the vascular anatomy. RADIATION DOSE REDUCTION: This exam was performed according to the departmental dose-optimization program which includes automated exposure control, adjustment of the mA and/or kV according to patient size and/or use of iterative reconstruction technique. CONTRAST:  OMNIPAQUE  IOHEXOL 350 MG/ML SOLN COMPARISON:  Chest radiograph earlier today and CT 06/27/2022 FINDINGS: Cardiovascular: Cardiomegaly. No pericardial effusion. Mitral annular calcification. Aortic and coronary artery calcification. Negative for acute pulmonary embolism. Mediastinum/Nodes:  Trachea and esophagus are unremarkable. No thoracic adenopathy. Lungs/Pleura: Similar atelectasis or consolidation in the left lower lobe. Expiratory phase exam. Mosaic attenuation of the lungs favors air trapping though a component of pulmonary edema is not excluded. Redemonstrated pulmonary nodules are unchanged measuring up to 7 mm in the right lower lobe (series 7/image 65) and 6 mm in the right middle lobe (7/68). No pleural effusion or pneumothorax. Upper Abdomen: No acute abnormality. Musculoskeletal: No acute fracture. Review of the MIP images confirms the above findings. IMPRESSION: 1. Negative for acute pulmonary embolism. 2. Similar atelectasis or consolidation in the left lower lobe. 3. Mosaic attenuation of the lungs favors air trapping from bronchiolitis though a component of pulmonary edema is not excluded. 4. Unchanged pulmonary nodules measuring up to 7 mm in the right lower lobe. Aortic Atherosclerosis (ICD10-I70.0). Electronically Signed   By: Minerva Fester M.D.   On: 03/20/2023 00:46   CT HEAD WO CONTRAST  Result Date: 03/20/2023 CLINICAL DATA:  Mental status change, unknown cause EXAM: CT HEAD WITHOUT CONTRAST TECHNIQUE: Contiguous axial images were obtained from the base of the skull through the vertex without intravenous contrast. RADIATION DOSE REDUCTION: This exam was performed according to the departmental dose-optimization program which includes automated exposure control, adjustment of the mA and/or kV according to patient size and/or use of iterative reconstruction technique. COMPARISON:  CT head 01/10/2021 FINDINGS: Brain: Patchy and confluent areas of decreased attenuation are noted throughout the deep and periventricular white matter of the cerebral hemispheres bilaterally, compatible with chronic microvascular ischemic disease. No evidence of large-territorial acute infarction. No parenchymal hemorrhage. No mass lesion. No extra-axial collection. No mass effect or midline  shift. No hydrocephalus. Basilar cisterns are patent. Vascular: No hyperdense vessel. Skull: No acute fracture or focal lesion. Sinuses/Orbits: Paranasal sinuses and mastoid air cells are clear. Bilateral lens replacement. Otherwise the orbits are unremarkable. Other: None. IMPRESSION: No acute intracranial abnormality. Electronically Signed   By: Tish Frederickson M.D.   On: 03/20/2023 00:40   DG Chest 1 View  Result Date: 03/19/2023 CLINICAL DATA:  Altered mental status. EXAM: CHEST  1 VIEW COMPARISON:  December 15, 2021 FINDINGS: There is stable mild to moderate severity cardiac silhouette enlargement. Marked severity calcification of the aortic arch is seen. Low lung volumes are noted with mild, diffuse, chronic appearing increased interstitial lung markings. Mild areas of scarring and/or atelectasis are seen within the bilateral lung bases. There is a small left pleural effusion. No pneumothorax is identified. No acute osseous abnormalities are identified. IMPRESSION: 1. Cardiomegaly and low lung volumes with mild bibasilar scarring and/or atelectasis. 2. Small left pleural effusion. Electronically Signed   By: Aram Candela M.D.   On: 03/19/2023 21:41           LOS: 1 day   Time spent= 35 mins    Miguel Rota, MD [facility]  If 7PM-7AM, please contact night-coverage  03/21/2023, 1:23 PM

## 2023-03-21 NOTE — Plan of Care (Signed)
Pt has been very lethargic and overly confused.  Problem: Education: Goal: Ability to describe self-care measures that may prevent or decrease complications (Diabetes Survival Skills Education) will improve Outcome: Not Progressing Goal: Individualized Educational Video(s) Outcome: Not Progressing  --------------------------------------------------------------------------------  Problem: Fluid Volume: Goal: Hemodynamic stability will improve Outcome: Progressing   Problem: Clinical Measurements: Goal: Diagnostic test results will improve Outcome: Progressing Goal: Signs and symptoms of infection will decrease Outcome: Progressing   Problem: Respiratory: Goal: Ability to maintain adequate ventilation will improve Outcome: Progressing   Problem: Coping: Goal: Ability to adjust to condition or change in health will improve Outcome: Progressing   Problem: Fluid Volume: Goal: Ability to maintain a balanced intake and output will improve Outcome: Progressing   Problem: Health Behavior/Discharge Planning: Goal: Ability to identify and utilize available resources and services will improve Outcome: Progressing Goal: Ability to manage health-related needs will improve Outcome: Progressing   Problem: Metabolic: Goal: Ability to maintain appropriate glucose levels will improve Outcome: Progressing   Problem: Nutritional: Goal: Maintenance of adequate nutrition will improve Outcome: Progressing Goal: Progress toward achieving an optimal weight will improve Outcome: Progressing   Problem: Skin Integrity: Goal: Risk for impaired skin integrity will decrease Outcome: Progressing   Problem: Tissue Perfusion: Goal: Adequacy of tissue perfusion will improve Outcome: Progressing   Problem: Education: Goal: Knowledge of General Education information will improve Description: Including pain rating scale, medication(s)/side effects and non-pharmacologic comfort measures Outcome:  Progressing   Problem: Health Behavior/Discharge Planning: Goal: Ability to manage health-related needs will improve Outcome: Progressing   Problem: Clinical Measurements: Goal: Ability to maintain clinical measurements within normal limits will improve Outcome: Progressing Goal: Will remain free from infection Outcome: Progressing Goal: Diagnostic test results will improve Outcome: Progressing Goal: Respiratory complications will improve Outcome: Progressing Goal: Cardiovascular complication will be avoided Outcome: Progressing   Problem: Activity: Goal: Risk for activity intolerance will decrease Outcome: Progressing   Problem: Nutrition: Goal: Adequate nutrition will be maintained Outcome: Progressing   Problem: Coping: Goal: Level of anxiety will decrease Outcome: Progressing   Problem: Elimination: Goal: Will not experience complications related to bowel motility Outcome: Progressing Goal: Will not experience complications related to urinary retention Outcome: Progressing   Problem: Pain Management: Goal: General experience of comfort will improve Outcome: Progressing   Problem: Safety: Goal: Ability to remain free from injury will improve Outcome: Progressing   Problem: Skin Integrity: Goal: Risk for impaired skin integrity will decrease Outcome: Progressing

## 2023-03-22 DIAGNOSIS — A419 Sepsis, unspecified organism: Secondary | ICD-10-CM | POA: Diagnosis not present

## 2023-03-22 DIAGNOSIS — J189 Pneumonia, unspecified organism: Secondary | ICD-10-CM | POA: Diagnosis not present

## 2023-03-22 LAB — COMPREHENSIVE METABOLIC PANEL
ALT: 12 U/L (ref 0–44)
AST: 15 U/L (ref 15–41)
Albumin: 2.7 g/dL — ABNORMAL LOW (ref 3.5–5.0)
Alkaline Phosphatase: 46 U/L (ref 38–126)
Anion gap: 9 (ref 5–15)
BUN: 11 mg/dL (ref 8–23)
CO2: 25 mmol/L (ref 22–32)
Calcium: 8.7 mg/dL — ABNORMAL LOW (ref 8.9–10.3)
Chloride: 103 mmol/L (ref 98–111)
Creatinine, Ser: 0.33 mg/dL — ABNORMAL LOW (ref 0.44–1.00)
GFR, Estimated: >60 mL/min (ref >60)
Glucose, Bld: 175 mg/dL — ABNORMAL HIGH (ref 70–99)
Potassium: 3.7 mmol/L (ref 3.5–5.1)
Sodium: 137 mmol/L (ref 135–145)
Total Bilirubin: <0.2 mg/dL (ref <1.2)
Total Protein: 5.9 g/dL — ABNORMAL LOW (ref 6.5–8.1)

## 2023-03-22 LAB — BRAIN NATRIURETIC PEPTIDE: B Natriuretic Peptide: 1024.4 pg/mL — ABNORMAL HIGH (ref 0.0–100.0)

## 2023-03-22 LAB — URINALYSIS, COMPLETE (UACMP) WITH MICROSCOPIC
Bilirubin Urine: NEGATIVE
Glucose, UA: NEGATIVE mg/dL
Ketones, ur: NEGATIVE mg/dL
Nitrite: NEGATIVE
Protein, ur: 30 mg/dL — AB
RBC / HPF: >50 RBC/hpf (ref 0–5)
Specific Gravity, Urine: 1.016 (ref 1.005–1.030)
Squamous Epithelial / HPF: 0 /[HPF] (ref 0–5)
WBC, UA: >50 WBC/hpf (ref 0–5)
pH: 5 (ref 5.0–8.0)

## 2023-03-22 LAB — GLUCOSE, CAPILLARY
Glucose-Capillary: 187 mg/dL — ABNORMAL HIGH (ref 70–99)
Glucose-Capillary: 195 mg/dL — ABNORMAL HIGH (ref 70–99)
Glucose-Capillary: 155 mg/dL — ABNORMAL HIGH (ref 70–99)
Glucose-Capillary: 139 mg/dL — ABNORMAL HIGH (ref 70–99)

## 2023-03-22 LAB — CBC
HCT: 35.4 % — ABNORMAL LOW (ref 36.0–46.0)
Hemoglobin: 11.3 g/dL — ABNORMAL LOW (ref 12.0–15.0)
MCH: 29.1 pg (ref 26.0–34.0)
MCHC: 31.9 g/dL (ref 30.0–36.0)
MCV: 91.2 fL (ref 80.0–100.0)
Platelets: 217 10*3/uL (ref 150–400)
RBC: 3.88 MIL/uL (ref 3.87–5.11)
RDW: 14.7 % (ref 11.5–15.5)
WBC: 8.9 10*3/uL (ref 4.0–10.5)
nRBC: 0 % (ref 0.0–0.2)

## 2023-03-22 LAB — MAGNESIUM: Magnesium: 2.3 mg/dL (ref 1.7–2.4)

## 2023-03-22 LAB — PROCALCITONIN: Procalcitonin: <0.1 ng/mL

## 2023-03-22 MED ORDER — FUROSEMIDE 10 MG/ML IJ SOLN
20.0000 mg | Freq: Once | INTRAMUSCULAR | Status: AC
  Administered 2023-03-22: 20 mg via INTRAVENOUS
  Filled 2023-03-22: qty 2

## 2023-03-22 MED ORDER — ACETAMINOPHEN 325 MG PO TABS
650.0000 mg | ORAL_TABLET | Freq: Four times a day (QID) | ORAL | Status: DC | PRN
  Administered 2023-03-22 – 2023-03-26 (×3): 650 mg via ORAL
  Filled 2023-03-22 (×3): qty 2

## 2023-03-22 NOTE — Progress Notes (Signed)
PROGRESS NOTE    Julie Acosta  QQV:[id number] DOB: 11-18-1933 DOA: 03/19/2023 PCP: Earnestine Mealing, MD      Brief Narrative:  89 year old with history of anxiety, depression, DM 2, GERD, HTN, HLD comes to the ED with altered mental status, confusion, hypoxia.  Upon admission had mild lactic acidosis, leukocytosis with concerns of sepsis secondary to pneumonia and urinary tract infection.  Started on Rocephin and azithromycin.  Over the course of 48 hours her mentation significantly improved.   Assessment & Plan:  Principal Problem:   Sepsis due to pneumonia [facility] Active Problems:   Acute lower UTI   Acute metabolic encephalopathy   Hyperthyroidism   Type 2 diabetes mellitus with peripheral neuropathy (HCC)   Essential hypertension    Sepsis due to pneumonia (HCC) and urinary tract infection Fever curve slightly better. COVID/Flu/Resp Panel-negative, ProCal 0.17.  Blood cultures-NGTD On Roc/Azi (EOT 11/18) IS, flutter, nebs.    Acute metabolic encephalopathy; resolved Much more awake today. CTH is neg, TSH/Ammonia/B12/folate = normal.   Hypokalemia/hypomagnesemia - Pharmacy pharmacy consulted to monitor and replete electrolytes  Abnormal breath sounds with elevated BNP - She had received fluid follow-up blood pressures and multiple IV electrolytes.  Will give 20 mg IV Lasix  History of breast cancer status postlumpectomy 2019 History of sarcoma - Follows outpatient oncology  Factor V Leiden homozygous -Per oncology on lifelong Pradaxa 150 mg twice daily   Essential hypertension - We will continue antihypertensive therapy.   Type 2 diabetes mellitus with peripheral neuropathy (HCC) Hold PO meds, ISS and accuchecks.  A1c 6.5   Hyperthyroidism TSH, Methomazole.   GERD PPI  PT/OT= wheelchair bound but does do basic movements.    DVT prophylaxis: dabigatran (PRADAXA) capsule 150 mg  Code Status: DNR Family Communication:  Daughter uptodate Status is:  Inpatient Abnormal electrolytes. cont hospital stay. Hopefully dc in next 24-48hrs.    Subjective: Doing significantly well today, no complaints.  Much more awake  Examination:  General exam: Appears calm and comfortable, pupils reactive bilaterally Respiratory system: Clear to auscultation. Respiratory effort normal. Cardiovascular system: S1 & S2 heard, RRR. No JVD, murmurs, rubs, gallops or clicks. No pedal edema. Gastrointestinal system: Abdomen is nondistended, soft and nontender. No organomegaly or masses felt. Normal bowel sounds heard. Central nervous system: AAOx3 Extremities: Symmetric 5 x 5 power. Left aka Skin: No rashes, lesions or ulcers                 Diet Orders (From admission, onward)     Start     Ordered   03/20/23 0833  Diet Carb Modified Fluid consistency: Thin; Room service appropriate? Yes  Diet effective now       Question Answer Comment  Diet-HS Snack? Nothing   Calorie Level Medium 1600-2000   Fluid consistency: Thin   Room service appropriate? Yes      03/20/23 0832            Objective: Vitals:   03/21/23 2006 03/22/23 0403 03/22/23 0745 03/22/23 1131  BP: (!) 111/48 123/86 101/76 (!) 142/124  Pulse: (!) 106 (!) 101 77 (!) 54  Resp: 20  16 18   Temp: 97.8 F (36.6 C) 97.7 F (36.5 C) (!) 97.5 F (36.4 C) 98.5 F (36.9 C)  TempSrc: Oral Oral    SpO2: 98% 98% 95% 97%  Weight:      Height:        Intake/Output Summary (Last 24 hours) at 03/22/2023 1313 Last data filed at 03/21/2023 2330  Gross per 24 hour  Intake 1370.47 ml  Output 750 ml  Net 620.47 ml   Filed Weights   03/20/23 0400 03/20/23 0447 03/20/23 1717  Weight: 72 kg 72 kg 71 kg    Scheduled Meds:  cholecalciferol  1,000 Units Oral Daily   dabigatran  150 mg Oral Q12H   fluticasone  2 spray Each Nare BID   gabapentin  100 mg Oral TID   insulin aspart  0-15 Units Subcutaneous TID WC   insulin aspart  0-5 Units Subcutaneous QHS   loratadine  10 mg  Oral Daily   melatonin  2.5 mg Oral QHS   methimazole  10 mg Oral Daily   metoprolol tartrate  50 mg Oral BID   midodrine  5 mg Oral TID WC   pantoprazole  40 mg Oral Daily   PARoxetine  30 mg Oral Daily   Continuous Infusions:  azithromycin Stopped (03/21/23 2330)   cefTRIAXone (ROCEPHIN)  IV Stopped (03/21/23 2212)    Nutritional status     Body mass index is 27.73 kg/m.  Data Reviewed:   CBC: Recent Labs  Lab 03/19/23 1849 03/21/23 0502 03/22/23 0500  WBC 13.1* 10.8* 8.9  NEUTROABS 9.8*  --   --   HGB 12.6 11.5* 11.3*  HCT 40.5 35.5* 35.4*  MCV 91.4 87.0 91.2  PLT 244 210 217   Basic Metabolic Panel: Recent Labs  Lab 03/19/23 1849 03/21/23 0502 03/22/23 0500  NA 136 135 137  K 4.4 2.9* 3.7  CL 101 99 103  CO2 26 26 25   GLUCOSE 135* 218* 175*  BUN 15 13 11   CREATININE 0.59 0.48 0.33*  CALCIUM 9.7 8.6* 8.7*  MG  --  1.1* 2.3  PHOS  --  2.6  --    GFR: Estimated Creatinine Clearance: 47.7 mL/min (A) (by C-G formula based on SCr of 0.33 mg/dL (L)). Liver Function Tests: Recent Labs  Lab 03/19/23 1849 03/21/23 0502 03/22/23 0500  AST 24 18 15   ALT 11 10 12   ALKPHOS 59 45 46  BILITOT 1.2* 0.4 <0.2  PROT 6.9 5.7* 5.9*  ALBUMIN 3.4* 2.8* 2.7*   No results for input(s): "LIPASE", "AMYLASE" in the last 168 hours. Recent Labs  Lab 03/20/23 1034  AMMONIA 31   Coagulation Profile: Recent Labs  Lab 03/19/23 1950 03/20/23 0422 03/20/23 0626  INR 1.1 1.2 1.2   Cardiac Enzymes: Recent Labs  Lab 03/20/23 1244 03/21/23 0502  CKTOTAL 327* 223   BNP (last 3 results) No results for input(s): "PROBNP" in the last 8760 hours. HbA1C: Recent Labs    03/20/23 0856  HGBA1C 6.5*   CBG: Recent Labs  Lab 03/21/23 1203 03/21/23 1619 03/21/23 2208 03/22/23 0746 03/22/23 1132  GLUCAP 145* 194* 139* 187* 195*   Lipid Profile: No results for input(s): "CHOL", "HDL", "LDLCALC", "TRIG", "CHOLHDL", "LDLDIRECT" in the last 72 hours. Thyroid  Function Tests: Recent Labs    03/20/23 0856 03/20/23 1034  TSH 0.307*  --   FREET4  --  0.84   Anemia Panel: Recent Labs    03/20/23 1034 03/21/23 0502  VITAMINB12 326  --   FOLATE  --  12.1   Sepsis Labs: Recent Labs  Lab 03/20/23 0422 03/20/23 0627 03/20/23 0856 03/20/23 1950 03/22/23 0500  PROCALCITON  --   --  0.17  --  <0.10  LATICACIDVEN 2.1* 1.8 1.5 1.0  --     Recent Results (from the past 240 hour(s))  Culture, blood (Routine x 2)  Status: None (Preliminary result)   Collection Time: 03/19/23  6:50 PM   Specimen: BLOOD  Result Value Ref Range Status   Specimen Description BLOOD LEFT ANTECUBITAL  Final   Special Requests   Final    BOTTLES DRAWN AEROBIC AND ANAEROBIC Blood Culture adequate volume   Culture   Final    NO GROWTH 3 DAYS Performed at [facility], [address], [place], [place] [address]    Report Status PENDING  Incomplete  Culture, blood (Routine x 2)     Status: None (Preliminary result)   Collection Time: 03/19/23  7:50 PM   Specimen: BLOOD  Result Value Ref Range Status   Specimen Description BLOOD BLOOD RIGHT ARM  Final   Special Requests   Final    BOTTLES DRAWN AEROBIC AND ANAEROBIC Blood Culture results may not be optimal due to an inadequate volume of blood received in culture bottles   Culture   Final    NO GROWTH 3 DAYS Performed at [facility], [address] [place], [place] [address]    Report Status PENDING  Incomplete  C Difficile Quick Screen w PCR reflex     Status: None   Collection Time: 03/20/23  1:36 AM   Specimen: STOOL  Result Value Ref Range Status   C Diff antigen NEGATIVE NEGATIVE Final   C Diff toxin NEGATIVE NEGATIVE Final   C Diff interpretation No C. difficile detected.  Final    Comment: Performed at [facility], [address] [place], [place] [address]  Gastrointestinal Panel by PCR , Stool     Status: None   Collection Time: 03/20/23  1:36 AM   Specimen:  STOOL  Result Value Ref Range Status   Campylobacter species NOT DETECTED NOT DETECTED Final   Plesimonas shigelloides NOT DETECTED NOT DETECTED Final   Salmonella species NOT DETECTED NOT DETECTED Final   Yersinia enterocolitica NOT DETECTED NOT DETECTED Final   Vibrio species NOT DETECTED NOT DETECTED Final   Vibrio cholerae NOT DETECTED NOT DETECTED Final   Enteroaggregative E coli (EAEC) NOT DETECTED NOT DETECTED Final   Enteropathogenic E coli (EPEC) NOT DETECTED NOT DETECTED Final   Enterotoxigenic E coli (ETEC) NOT DETECTED NOT DETECTED Final   Shiga like toxin producing E coli (STEC) NOT DETECTED NOT DETECTED Final   Shigella/Enteroinvasive E coli (EIEC) NOT DETECTED NOT DETECTED Final   Cryptosporidium NOT DETECTED NOT DETECTED Final   Cyclospora cayetanensis NOT DETECTED NOT DETECTED Final   Entamoeba histolytica NOT DETECTED NOT DETECTED Final   Giardia lamblia NOT DETECTED NOT DETECTED Final   Adenovirus F40/41 NOT DETECTED NOT DETECTED Final   Astrovirus NOT DETECTED NOT DETECTED Final   Norovirus GI/GII NOT DETECTED NOT DETECTED Final   Rotavirus A NOT DETECTED NOT DETECTED Final   Sapovirus (I, II, IV, and V) NOT DETECTED NOT DETECTED Final    Comment: Performed at [facility], [address] [place], [place] [address]  SARS Coronavirus 2 by RT PCR (hospital order, performed in [facility] hospital lab) *cepheid single result test* Anterior Nasal Swab     Status: None   Collection Time: 03/20/23  1:00 PM   Specimen: Anterior Nasal Swab  Result Value Ref Range Status   SARS Coronavirus 2 by RT PCR NEGATIVE NEGATIVE Final    Comment: (NOTE) SARS-CoV-2 target nucleic acids are NOT DETECTED.  The SARS-CoV-2 RNA is generally detectable in upper and lower respiratory specimens during the acute phase of infection. The  lowest concentration of SARS-CoV-2 viral copies this assay can detect is 250 copies / mL. A negative result does not preclude SARS-CoV-2  infection and should not be used as the sole basis for treatment or other patient management decisions.  A negative result may occur with improper specimen collection / handling, submission of specimen other than nasopharyngeal swab, presence of viral mutation(s) within the areas targeted by this assay, and inadequate number of viral copies (<250 copies / mL). A negative result must be combined with clinical observations, patient history, and epidemiological information.  Fact Sheet for Patients:   RoadLapTop.co.za  Fact Sheet for Healthcare Providers: http://kim-miller.com/  This test is not yet approved or  cleared by the [place] FDA and has been authorized for detection and/or diagnosis of SARS-CoV-2 by FDA under an Emergency Use Authorization (EUA).  This EUA will remain in effect (meaning this test can be used) for the duration of the COVID-19 declaration under Section 564(b)(1) of the Act, 21 U.S.C. section 360bbb-3(b)(1), unless the authorization is terminated or revoked sooner.  Performed at [facility], [address] [place], [place] [address]   Respiratory (~20 pathogens) panel by PCR     Status: None   Collection Time: 03/20/23  1:00 PM   Specimen: Nasopharyngeal Swab; Respiratory  Result Value Ref Range Status   Adenovirus NOT DETECTED NOT DETECTED Final   Coronavirus 229E NOT DETECTED NOT DETECTED Final    Comment: (NOTE) The Coronavirus on the Respiratory Panel, DOES NOT test for the novel  Coronavirus (2019 nCoV)    Coronavirus HKU1 NOT DETECTED NOT DETECTED Final   Coronavirus NL63 NOT DETECTED NOT DETECTED Final   Coronavirus OC43 NOT DETECTED NOT DETECTED Final   Metapneumovirus NOT DETECTED NOT DETECTED Final   Rhinovirus / Enterovirus NOT DETECTED NOT DETECTED Final   Influenza A NOT DETECTED NOT DETECTED Final   Influenza B NOT DETECTED NOT DETECTED Final   Parainfluenza Virus 1 NOT DETECTED  NOT DETECTED Final   Parainfluenza Virus 2 NOT DETECTED NOT DETECTED Final   Parainfluenza Virus 3 NOT DETECTED NOT DETECTED Final   Parainfluenza Virus 4 NOT DETECTED NOT DETECTED Final   Respiratory Syncytial Virus NOT DETECTED NOT DETECTED Final   Bordetella pertussis NOT DETECTED NOT DETECTED Final   Bordetella Parapertussis NOT DETECTED NOT DETECTED Final   Chlamydophila pneumoniae NOT DETECTED NOT DETECTED Final   Mycoplasma pneumoniae NOT DETECTED NOT DETECTED Final    Comment: Performed at [facility], [address], [place], [place] [address]  MRSA Next Gen by PCR, Nasal     Status: None   Collection Time: 03/20/23  1:00 PM   Specimen: Nasopharyngeal Swab; Nasal Swab  Result Value Ref Range Status   MRSA by PCR Next Gen NOT DETECTED NOT DETECTED Final    Comment: (NOTE) The GeneXpert MRSA Assay (FDA approved for NASAL specimens only), is one component of a comprehensive MRSA colonization surveillance program. It is not intended to diagnose MRSA infection nor to guide or monitor treatment for MRSA infections. Test performance is not FDA approved in patients less than 58 years old. Performed at [facility], [address], [place], [place] [address]          Radiology Studies: [facility] [place] 1 View  Result Date: 03/21/2023 CLINICAL DATA:  Dyspnea. EXAM: PORTABLE CHEST 1 VIEW COMPARISON:  March 19, 2023 FINDINGS: Cardiac silhouette is enlarged.  Mediastinal contours appear intact. Mild interstitial pulmonary edema. Left lower lobe atelectasis and/or small  pleural effusion. Osseous structures are without acute abnormality. Soft tissues are grossly normal. IMPRESSION: 1. Mild interstitial pulmonary edema. 2. Left lower lobe atelectasis and/or small pleural effusion. Electronically Signed   By: Ted Mcalpine M.D.   On: 03/21/2023 15:43           LOS: 2 days   Time spent= 35 mins    Miguel Rota, MD [facility]  If 7PM-7AM, please  contact night-coverage  03/22/2023, 1:13 PM

## 2023-03-22 NOTE — Evaluation (Signed)
Physical Therapy Evaluation Patient Details Name: Julie Acosta MRN: [id number] DOB: [birth year] Today's Date: 03/22/2023  History of Present Illness  Pt is an 89 y/o F admitted on 03/19/23 after presenting with AMS, confusion, hypoxia. Pt is being treated for sepsis 2/2 PNA & UTI. 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 Pt received in bed, incontinent of stool. Pt performs bed mobility with min assist with use of bed rails to allow OT/PT to perform peri hygiene total assist. Pt is able to tolerate sitting EOB with supervision to brush teeth, but too fatigued to attempt to scoot to recliner. Will continue to follow pt acutely to progress transfers as able & focus on strengthening, endurance, & balance.      If plan is discharge home, recommend the following: Two people to help with walking and/or transfers;Two people to help with bathing/dressing/bathroom;Direct supervision/assist for medications management   Can travel by private vehicle   No    Equipment Recommendations None recommended by PT  Recommendations for Other Services       Functional Status Assessment Patient has had a recent decline in their functional status and demonstrates the ability to make significant improvements in function in a reasonable and predictable amount of time.     Precautions / Restrictions Precautions Precautions: Fall Precaution Comments: L hemipelvectomy & amputation Restrictions Weight Bearing Restrictions: No      Mobility  Bed Mobility Overal bed mobility: Needs Assistance Bed Mobility: Rolling, Supine to Sit, Sit to Supine Rolling: Min assist, Used rails   Supine to sit: Min assist, HOB elevated, Used rails Sit to supine: Min assist, Used rails, HOB elevated   General bed mobility comments: +2 to scoot to [facility]     Transfers                        Ambulation/Gait                  Stairs            Wheelchair Mobility     Tilt Bed    Modified Rankin (Stroke Patients Only)       Balance Overall balance assessment: Needs assistance Sitting-balance support: Feet supported, Bilateral upper extremity supported, Feet unsupported Sitting balance-Leahy Scale: Fair Sitting balance - Comments: Pt able to brush teeth sitting EOB without LOB.                                     Pertinent Vitals/Pain Pain Assessment Pain Assessment: No/denies pain    Home Living Family/patient expects to be discharged to:: Unsure [facility])                   Additional Comments: Pt reports she has aides that assist her. Unsure if pt is from LTC or not.    Prior Function               Mobility Comments: Pt inconsistent historian. Pt reports she gets to the toilet with a lift but unable to describe how she transfers to/from w/c.       Extremity/Trunk Assessment   Upper Extremity Assessment Upper Extremity Assessment: Generalized weakness    Lower Extremity Assessment Lower Extremity Assessment: Generalized weakness (hx of  left hemipelvectomy & amputation)       Communication   Communication Communication: No apparent difficulties  Cognition Arousal: Alert Behavior During Therapy: WFL for tasks assessed/performed Overall Cognitive Status: No family/caregiver present to determine baseline cognitive functioning                                 General Comments: AxOx self, birthday, month but not year nor location or situation        General Comments General comments (skin integrity, edema, etc.): Pt on 2L/min, attempted to wean pt to room air but SPO2 dropped to 88% so pt placed back on 2L/min with SpO2 >/= 90% throughout.    Exercises     Assessment/Plan    PT Assessment Patient needs continued PT services  PT Problem  List Decreased strength;Cardiopulmonary status limiting activity;Decreased range of motion;Decreased activity tolerance;Decreased balance;Decreased mobility;Decreased cognition;Decreased knowledge of use of DME;Decreased safety awareness       PT Treatment Interventions DME instruction;Balance training;Modalities;Gait training;Neuromuscular re-education;Stair training;Functional mobility training;Therapeutic activities;Therapeutic exercise;Manual techniques;Wheelchair mobility training;Patient/family education    PT Goals (Current goals can be found in the Care Plan section)  Acute Rehab PT Goals Patient Stated Goal: none stated PT Goal Formulation: With patient Time For Goal Achievement: 04/05/23 Potential to Achieve Goals: Good    Frequency Min 1X/week     Co-evaluation PT/OT/SLP Co-Evaluation/Treatment: Yes Reason for Co-Treatment: For patient/therapist safety;To address functional/ADL transfers PT goals addressed during session: Mobility/safety with mobility;Balance         AM-PAC PT "6 Clicks" Mobility  Outcome Measure Help needed turning from your back to your side while in a flat bed without using bedrails?: A Little Help needed moving from lying on your back to sitting on the side of a flat bed without using bedrails?: A Lot Help needed moving to and from a bed to a chair (including a wheelchair)?: Total Help needed standing up from a chair using your arms (e.g., wheelchair or bedside chair)?: Total Help needed to walk in hospital room?: Total Help needed climbing 3-5 steps with a railing? : Total 6 Click Score: 9    End of Session Equipment Utilized During Treatment: Oxygen Activity Tolerance: Patient tolerated treatment well Patient left: in bed;with call bell/phone within reach;with bed alarm set Nurse Communication: Mobility status PT Visit Diagnosis: Muscle weakness (generalized) (M62.81);Other abnormalities of gait and mobility (R26.89)    Time: 6213-0865 PT  Time Calculation (min) (ACUTE ONLY): 33 min   Charges:   PT Evaluation $PT Eval Low Complexity: 1 Low   PT General Charges $$ ACUTE PT VISIT: 1 Visit         Aleda Grana, PT, DPT 03/22/23, 11:08 AM   Sandi Mariscal 03/22/2023, 11:07 AM

## 2023-03-22 NOTE — Progress Notes (Signed)
PHARMACY CONSULT NOTE - FOLLOW UP  Pharmacy Consult for Electrolyte Monitoring and Replacement   Recent Labs: Potassium (mmol/L)  Date Value  03/22/2023 3.7  07/01/2013 4.6   Magnesium (mg/dL)  Date Value  65/78/4696 2.3   Calcium (mg/dL)  Date Value  29/52/8413 8.7 (L)   Calcium, Total (PTH) (mg/dL)  Date Value  24/40/1027 11.9 (H)   Albumin (g/dL)  Date Value  25/36/6440 2.7 (L)  07/01/2013 3.6   Phosphorus (mg/dL)  Date Value  34/74/2595 2.6   Sodium  Date Value  03/22/2023 137 mmol/L  10/16/2022 139  07/01/2013 139 mmol/L     Assessment: 89 y.o. Caucasian female with medical history significant for anxiety, depression, type 2 diabetes mellitus, GERD, hypertension and dyslipidemia, presented to the emergency room with acute onset of altered mental status with confusion, restlessness and occasional agitation as well as hypoxia at her SNF.     Goal of Therapy:  Electrolytes WNL  Plan:  ---no electrolyte replacement warranted for today ---recheck electrolytes in am  Lowella Bandy ,PharmD Clinical Pharmacist 03/22/2023 12:47 PM

## 2023-03-22 NOTE — Evaluation (Signed)
Occupational Therapy Evaluation Patient Details Name: Julie Acosta MRN: [id number] DOB: December 09, 1933 Today's Date: 03/22/2023   History of Present Illness Pt is an 89 y/o F admitted on 03/19/23 after presenting with AMS, confusion, hypoxia. Pt is being treated for sepsis 2/2 PNA & UTI. 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 Prior to hospital admission, pt was living at [facility] and reports living in the "middle one" when asked if she was at independent living, assisted, or skilled nursing/LTC. Pt acutely on 2L O2 and desats to high 80's on RA, back up to low 90's on 2L. Pt presents to acute OT demonstrating impaired ADL performance and functional mobility 2/2 impaired cognition, strength, balance, and activity tolerance (See OT problem list for additional functional deficits). Pt currently requires MIN A for bed mobility, MAX A for LB ADL, MAX A for pericare from bed level. Able to sit EOB to brush her teeth with setup. Pt would benefit from skilled OT services to address noted impairments and functional limitations (see below for any additional details) in order to maximize safety and independence while minimizing falls risk and caregiver burden.     If plan is discharge home, recommend the following: Two people to help with walking and/or transfers;Two people to help with bathing/dressing/bathroom;Assistance with cooking/housework;Assist for transportation;Help with stairs or ramp for entrance;Supervision due to cognitive status;Direct supervision/assist for medications management;Direct supervision/assist for financial management    Functional Status Assessment  Patient has had a recent decline in their functional status and demonstrates the ability to make significant improvements in function in a reasonable and predictable  amount of time.  Equipment Recommendations  Other (comment) (defer)    Recommendations for Other Services       Precautions / Restrictions Precautions Precautions: Fall Precaution Comments: L hemipelvectomy & amputation Restrictions Weight Bearing Restrictions: No      Mobility Bed Mobility Overal bed mobility: Needs Assistance Bed Mobility: Rolling, Supine to Sit, Sit to Supine Rolling: Min assist, Used rails   Supine to sit: Min assist, HOB elevated, Used rails Sit to supine: Min assist, Used rails, HOB elevated   General bed mobility comments: +2 to scoot to [facility]    Transfers                          Balance Overall balance assessment: Needs assistance Sitting-balance support: Feet supported, Bilateral upper extremity supported, Feet unsupported Sitting balance-Leahy Scale: Fair Sitting balance - Comments: Pt able to brush teeth sitting EOB without LOB.                                   ADL either performed or assessed with clinical judgement   ADL                                         General ADL Comments: Pt requires MAX A for LB ADL, MAX A for pericare after incontinent BM in bed, setup for seated grooming     Vision         Perception         Praxis  Pertinent Vitals/Pain Pain Assessment Pain Assessment: No/denies pain     Extremity/Trunk Assessment Upper Extremity Assessment Upper Extremity Assessment: Generalized weakness   Lower Extremity Assessment Lower Extremity Assessment: Generalized weakness (hx of left hemipelvectomy & amputation)       Communication Communication Communication: No apparent difficulties   Cognition Arousal: Alert Behavior During Therapy: WFL for tasks assessed/performed Overall Cognitive Status: No family/caregiver present to determine baseline cognitive functioning                                 General Comments: AxOx self, birthday, month  but not year nor location or situation     General Comments  Pt on 2L/min, attempted to wean pt to room air but SPO2 dropped to 88% so pt placed back on 2L/min with SpO2 >/= 90% throughout.    Exercises     Shoulder Instructions      Home Living Family/patient expects to be discharged to:: Unsure [facility])                                 Additional Comments: Pt reports she has aides that assist her. Unsure if pt is from LTC or not.      Prior Functioning/Environment               Mobility Comments: Pt inconsistent historian. Pt reports she gets to the toilet with a lift but unable to describe how she transfers to/from w/c.          OT Problem List: Decreased strength;Decreased cognition;Decreased activity tolerance;Impaired balance (sitting and/or standing);Decreased knowledge of use of DME or AE;Obesity      OT Treatment/Interventions: Self-care/ADL training;Therapeutic exercise;Therapeutic activities;DME and/or AE instruction;Patient/family education;Balance training;Cognitive remediation/compensation    OT Goals(Current goals can be found in the care plan section) Acute Rehab OT Goals Patient Stated Goal: get better OT Goal Formulation: With patient Time For Goal Achievement: 04/05/23 Potential to Achieve Goals: Good ADL Goals Pt Will Perform Lower Body Dressing: with caregiver independent in assisting;sitting/lateral leans Pt Will Transfer to Toilet: with max assist;squat pivot transfer;bedside commode Pt Will Perform Toileting - Clothing Manipulation and hygiene: with min assist;sitting/lateral leans  OT Frequency: Min 1X/week    Co-evaluation PT/OT/SLP Co-Evaluation/Treatment: Yes Reason for Co-Treatment: For patient/therapist safety;To address functional/ADL transfers PT goals addressed during session: Mobility/safety with mobility;Balance OT goals addressed during session: ADL's and self-care      AM-PAC OT "6 Clicks" Daily Activity      Outcome Measure Help from another person eating meals?: None Help from another person taking care of personal grooming?: A Little Help from another person toileting, which includes using toliet, bedpan, or urinal?: Total Help from another person bathing (including washing, rinsing, drying)?: A Lot Help from another person to put on and taking off regular upper body clothing?: A Lot Help from another person to put on and taking off regular lower body clothing?: A Lot 6 Click Score: 14   End of Session Nurse Communication: Mobility status  Activity Tolerance: Patient tolerated treatment well Patient left: in bed;with call bell/phone within reach;with bed alarm set  OT Visit Diagnosis: Other abnormalities of gait and mobility (R26.89);Muscle weakness (generalized) (M62.81)                Time: 1015-1050 OT Time Calculation (min): 35 min Charges:  OT General Charges $OT Visit: 1 Visit  OT Evaluation $OT Eval Low Complexity: 1 Low OT Treatments $Self Care/Home Management : 8-22 mins  Arman Filter., MPH, MS, OTR/L ascom [phone] 03/22/23, 1:51 PM

## 2023-03-22 NOTE — Progress Notes (Signed)
Per Dr Nelson Chimes, dc tele monitoring order

## 2023-03-22 NOTE — Plan of Care (Signed)
Pt's cognitive status makes it difficult to retain information presented.  Problem: Education: Goal: Ability to describe self-care measures that may prevent or decrease complications (Diabetes Survival Skills Education) will improve Outcome: Not Progressing Goal: Individualized Educational Video(s) Outcome: Not Progressing   Problem: Education: Goal: Knowledge of General Education information will improve Description: Including pain rating scale, medication(s)/side effects and non-pharmacologic comfort measures Outcome: Not Progressing  --------------------------------------------------------------------------------------  Problem: Fluid Volume: Goal: Hemodynamic stability will improve Outcome: Progressing   Problem: Clinical Measurements: Goal: Diagnostic test results will improve Outcome: Progressing Goal: Signs and symptoms of infection will decrease Outcome: Progressing   Problem: Respiratory: Goal: Ability to maintain adequate ventilation will improve Outcome: Progressing   Problem: Coping: Goal: Ability to adjust to condition or change in health will improve Outcome: Progressing   Problem: Fluid Volume: Goal: Ability to maintain a balanced intake and output will improve Outcome: Progressing   Problem: Health Behavior/Discharge Planning: Goal: Ability to identify and utilize available resources and services will improve Outcome: Progressing Goal: Ability to manage health-related needs will improve Outcome: Progressing   Problem: Metabolic: Goal: Ability to maintain appropriate glucose levels will improve Outcome: Progressing   Problem: Nutritional: Goal: Maintenance of adequate nutrition will improve Outcome: Progressing Goal: Progress toward achieving an optimal weight will improve Outcome: Progressing   Problem: Skin Integrity: Goal: Risk for impaired skin integrity will decrease Outcome: Progressing   Problem: Tissue Perfusion: Goal: Adequacy of  tissue perfusion will improve Outcome: Progressing   Problem: Health Behavior/Discharge Planning: Goal: Ability to manage health-related needs will improve Outcome: Progressing   Problem: Clinical Measurements: Goal: Ability to maintain clinical measurements within normal limits will improve Outcome: Progressing Goal: Will remain free from infection Outcome: Progressing Goal: Diagnostic test results will improve Outcome: Progressing Goal: Respiratory complications will improve Outcome: Progressing Goal: Cardiovascular complication will be avoided Outcome: Progressing   Problem: Activity: Goal: Risk for activity intolerance will decrease Outcome: Progressing   Problem: Nutrition: Goal: Adequate nutrition will be maintained Outcome: Progressing   Problem: Coping: Goal: Level of anxiety will decrease Outcome: Progressing   Problem: Elimination: Goal: Will not experience complications related to bowel motility Outcome: Progressing Goal: Will not experience complications related to urinary retention Outcome: Progressing   Problem: Pain Management: Goal: General experience of comfort will improve Outcome: Progressing   Problem: Safety: Goal: Ability to remain free from injury will improve Outcome: Progressing   Problem: Skin Integrity: Goal: Risk for impaired skin integrity will decrease Outcome: Progressing

## 2023-03-23 ENCOUNTER — Inpatient Hospital Stay: Payer: Medicare Other

## 2023-03-23 DIAGNOSIS — A419 Sepsis, unspecified organism: Secondary | ICD-10-CM | POA: Diagnosis not present

## 2023-03-23 DIAGNOSIS — J189 Pneumonia, unspecified organism: Secondary | ICD-10-CM | POA: Diagnosis not present

## 2023-03-23 LAB — COMPREHENSIVE METABOLIC PANEL
ALT: 14 U/L (ref 0–44)
AST: 22 U/L (ref 15–41)
Albumin: 2.8 g/dL — ABNORMAL LOW (ref 3.5–5.0)
Alkaline Phosphatase: 53 U/L (ref 38–126)
Anion gap: 8 (ref 5–15)
BUN: 14 mg/dL (ref 8–23)
CO2: 27 mmol/L (ref 22–32)
Calcium: 9.1 mg/dL (ref 8.9–10.3)
Chloride: 102 mmol/L (ref 98–111)
Creatinine, Ser: 0.53 mg/dL (ref 0.44–1.00)
GFR, Estimated: >60 mL/min (ref >60)
Glucose, Bld: 266 mg/dL — ABNORMAL HIGH (ref 70–99)
Potassium: 3.5 mmol/L (ref 3.5–5.1)
Sodium: 137 mmol/L (ref 135–145)
Total Bilirubin: 0.4 mg/dL (ref <1.2)
Total Protein: 6.3 g/dL — ABNORMAL LOW (ref 6.5–8.1)

## 2023-03-23 LAB — GLUCOSE, CAPILLARY
Glucose-Capillary: 169 mg/dL — ABNORMAL HIGH (ref 70–99)
Glucose-Capillary: 229 mg/dL — ABNORMAL HIGH (ref 70–99)
Glucose-Capillary: 147 mg/dL — ABNORMAL HIGH (ref 70–99)
Glucose-Capillary: 156 mg/dL — ABNORMAL HIGH (ref 70–99)

## 2023-03-23 LAB — CBC
HCT: 36.8 % (ref 36.0–46.0)
Hemoglobin: 11.5 g/dL — ABNORMAL LOW (ref 12.0–15.0)
MCH: 28.9 pg (ref 26.0–34.0)
MCHC: 31.3 g/dL (ref 30.0–36.0)
MCV: 92.5 fL (ref 80.0–100.0)
Platelets: 255 10*3/uL (ref 150–400)
RBC: 3.98 MIL/uL (ref 3.87–5.11)
RDW: 14.7 % (ref 11.5–15.5)
WBC: 8.8 10*3/uL (ref 4.0–10.5)
nRBC: 0 % (ref 0.0–0.2)

## 2023-03-23 LAB — MAGNESIUM: Magnesium: 1.8 mg/dL (ref 1.7–2.4)

## 2023-03-23 LAB — CK: Total CK: 68 U/L (ref 38–234)

## 2023-03-23 MED ORDER — TRAZODONE HCL 50 MG PO TABS
50.0000 mg | ORAL_TABLET | Freq: Every day | ORAL | Status: DC
  Administered 2023-03-23: 50 mg via ORAL
  Filled 2023-03-23: qty 1

## 2023-03-23 MED ORDER — AZITHROMYCIN 500 MG PO TABS
500.0000 mg | ORAL_TABLET | Freq: Every day | ORAL | Status: AC
  Administered 2023-03-23: 500 mg via ORAL
  Filled 2023-03-23: qty 1

## 2023-03-23 MED ORDER — QUETIAPINE FUMARATE 25 MG PO TABS
50.0000 mg | ORAL_TABLET | Freq: Every evening | ORAL | Status: DC
  Administered 2023-03-23: 50 mg via ORAL
  Filled 2023-03-23: qty 2

## 2023-03-23 MED ORDER — SODIUM CHLORIDE 0.9 % IV SOLN
2.0000 g | INTRAVENOUS | Status: AC
  Administered 2023-03-23 – 2023-03-24 (×2): 2 g via INTRAVENOUS
  Filled 2023-03-23 (×2): qty 20

## 2023-03-23 NOTE — TOC Initial Note (Signed)
Transition of Care [facility]) - Initial/Assessment Note    Patient Details  Name: Julie Acosta MRN: [id number] Date of Birth: June 24, 1933  Transition of Care [facility]) CM/SW Contact:    Allena Katz, LCSW Phone Number: 03/23/2023, 3:27 PM  Clinical Narrative:   Pt Admitted from [facility] for LTC, here with sepsis.  CSW to follow for care plan updates and needs.                Expected Discharge Plan: Skilled Nursing Facility Barriers to Discharge: Continued Medical Work up   Patient Goals and CMS Choice Patient states their goals for this hospitalization and ongoing recovery are:: return to [facility]          Expected Discharge Plan and Services                                              Prior Living Arrangements/Services     Patient language and need for interpreter reviewed:: Yes                 Activities of Daily Living   ADL Screening (condition at time of admission) Independently performs ADLs?: No Does the patient have a NEW difficulty with bathing/dressing/toileting/self-feeding that is expected to last >3 days?: No Does the patient have a NEW difficulty with getting in/out of bed, walking, or climbing stairs that is expected to last >3 days?: No Does the patient have a NEW difficulty with communication that is expected to last >3 days?: No Is the patient deaf or have difficulty hearing?: No Does the patient have difficulty seeing, even when wearing glasses/contacts?: No Does the patient have difficulty concentrating, remembering, or making decisions?: Yes  Permission Sought/Granted                  Emotional Assessment       Orientation: : Oriented to Self      Admission diagnosis:  Respiratory distress [R06.03] Acute respiratory failure with hypoxia (HCC) [J96.01] Altered mental status, unspecified altered mental status type [R41.82] Sepsis due to pneumonia (HCC) [J18.9, A41.9] Community acquired pneumonia, unspecified  laterality [J18.9] Sepsis, due to unspecified organism, unspecified whether acute organ dysfunction present [facility]) [A41.9] Patient Active Problem List   Diagnosis Date Noted   Sepsis due to pneumonia (HCC) 03/20/2023   Acute lower UTI 03/20/2023   Acute metabolic encephalopathy 03/20/2023   Type 2 diabetes mellitus with peripheral neuropathy (HCC) 03/20/2023   Essential hypertension 03/20/2023   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   Hypercalcemia 02/16/2023   Hyperparathyroidism (HCC) 02/16/2023   Hyperthyroidism 12/19/2021   Thyroid nodule 12/19/2021   Paroxysmal atrial fibrillation (HCC) 12/17/2021   Type 2 MI (myocardial infarction) (HCC) 12/15/2021   PAD (peripheral artery disease) (HCC) 07/25/2020   Splenic infarct 07/23/2019   Coagulation disorder (HCC) 05/30/2019   Pain due to onychomycosis of toenail of right foot 10/18/2018   Breast cancer of upper-outer quadrant of left female breast (HCC) 07/27/2017   Obesity with body mass index of 30.0-39.9 07/27/2017   DVT (deep venous thrombosis) (HCC) 07/27/2017   Primary cancer of upper outer quadrant of left female breast (HCC) 06/12/2017   Diarrhea 01/16/2017   Chronic diarrhea 01/12/2017   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 [place] [address] Phone: [phone] Fax: [phone]  [id number] - [place], [place] - [address] [place] [address] Phone: [phone] Fax: [phone]  [facility] - [place], [place] - [address] [place] [address] Phone: [phone] Fax: [phone]     Social Determinants of Health (SDOH) Social History: SDOH Screenings   Food Insecurity: No Food Insecurity (03/20/2023)  Housing: Low Risk  (03/20/2023)  Transportation Needs: No Transportation Needs (03/20/2023)  Utilities: Not At Risk (03/20/2023)  Financial Resource Strain: Low Risk  (02/16/2023)   Received from [facility]  Tobacco Use: Low Risk  (03/20/2023)   SDOH Interventions:     Readmission Risk Interventions     No data to display

## 2023-03-23 NOTE — Progress Notes (Addendum)
[facility] Progress Note  Patient: Julie Acosta    UXL:[id number]  DOA: 03/19/2023     Date of Service: the patient was seen and examined on 03/23/2023  Chief Complaint  Patient presents with   Altered Mental Status   Brief hospital course: 89 year old with history of anxiety, depression, DM 2, GERD, HTN, HLD comes to the ED with altered mental status, confusion, hypoxia.  Upon admission had mild lactic acidosis, leukocytosis with concerns of sepsis secondary to pneumonia and urinary tract infection.  Started on Rocephin and azithromycin.  Over the course of 48 hours her mentation significantly improved.     Assessment & Plan:  Principal Problem:   Sepsis due to pneumonia [facility]) Active Problems:   Acute lower UTI   Acute metabolic encephalopathy   Hyperthyroidism   Type 2 diabetes mellitus with peripheral neuropathy (HCC)   Essential hypertension     Sepsis due to pneumonia and urinary tract infection Fever curve slightly better. COVID/Flu/Resp Panel-negative, ProCal 0.17.  Blood cultures NGTD S/p azithromycin x 4 doses Continue ceftriaxone 1 g IV daily for 5 days IS, flutter, nebs.    Acute metabolic encephalopathy; resolved Much more awake today. CTH is neg, TSH/Ammonia/B12/folate = normal.  11/18 delirium and visual hallucinations, insomnia Started Seroquel 50 mg every evening and trazodone 50 mg nightly for sleep    Hypokalemia/hypomagnesemia - Pharmacy pharmacy consulted to monitor and replete electrolytes   Volume overload Patient had elevated BNP and abnormal breath sounds Patient received IV fluid and IV electrolytes S/p IV Lasix 20 mg Currently patient is euvolemic, we will continue to monitor.    History of breast cancer status postlumpectomy 2019 History of sarcoma - Follows outpatient oncology   Factor V Leiden homozygous -Per oncology on lifelong Pradaxa 150 mg twice daily   Hematuria noticed on 11/18 Hold Pradaxa for today, we will  reassess tomorrow a.m. Follow renal sonogram and bladder scan  Essential hypertension - continue antihypertensive therapy.   Type 2 diabetes mellitus with peripheral neuropathy (HCC) Hold PO meds, ISS and accuchecks.  A1c 6.5   Hyperthyroidism TSH, Methomazole.    GERD PPI   PT/OT= wheelchair bound but does do basic movements   Body mass index is 27.73 kg/m.  Interventions:  Diet Orders (From admission, onward)     Start     Ordered   03/20/23 0833  Diet Carb Modified Fluid consistency: Thin; Room service appropriate? Yes  Diet effective now       Question Answer Comment  Diet-HS Snack? Nothing   Calorie Level Medium 1600-2000   Fluid consistency: Thin   Room service appropriate? Yes      03/20/23 0832            DVT Prophylaxis: Pradaxa, held due to hematuria  Advance goals of care discussion: DNR  Family Communication: family was not present at bedside, at the time of interview.  The pt provided permission to discuss medical plan with the family. Opportunity was given to ask question and all questions were answered satisfactorily.  11/18 discussed with patient's daughter over the phone  Disposition:  Pt is from SNF, admitted with AMS due to PNA and UTI, still has AMS, which precludes a safe discharge. Discharge to SNF, when stable, most likely discharge in 1 to 2 days.  Subjective: No significant events overnight, in the morning time patient was AO x 2, as per RN she did not sleep well last night and seemed very confused.  Patient was also having delirium  and visual hallucinations.  On my exam patient was slightly confused as well, did not mention about any complaints.  Physical Exam: General: NAD, lying comfortably Appear in no distress, affect appropriate Eyes: PERRLA ENT: Oral Mucosa Clear, moist  Neck: no JVD,  Cardiovascular: S1 and S2 Present, no Murmur,  Respiratory: good respiratory effort, Bilateral Air entry equal and Decreased, no Crackles,  no wheezes Abdomen: Bowel Sound present, Soft and no tenderness,  Skin: no rashes Extremities: RLE no Pedal edema and no calf tenderness, s/p LLE amputation due to left hemipelvis surgery since 1962 at age 60 due to cancer Neurologic: without any new focal findings Gait not checked due to wheel chair bound at baseline   Vitals:   03/22/23 1608 03/22/23 1958 03/23/23 0502 03/23/23 0753  BP: (!) 142/51 130/71 120/68 (!) 134/59  Pulse: 89 83 (!) 104 88  Resp: 18 17 18 19   Temp: 98 F (36.7 C) (!) 97.5 F (36.4 C) 97.9 F (36.6 C) 98.2 F (36.8 C)  TempSrc:  Oral    SpO2: 98% 98% 98% 92%  Weight:      Height:        Intake/Output Summary (Last 24 hours) at 03/23/2023 1418 Last data filed at 03/23/2023 1054 Gross per 24 hour  Intake 800 ml  Output 950 ml  Net -150 ml   Filed Weights   03/20/23 0400 03/20/23 0447 03/20/23 1717  Weight: 72 kg 72 kg 71 kg    Data Reviewed: I have personally reviewed and interpreted daily labs, tele strips, imagings as discussed above. I reviewed all nursing notes, pharmacy notes, vitals, pertinent old records I have discussed plan of care as described above with RN and patient/family.  CBC: Recent Labs  Lab 03/19/23 1849 03/21/23 0502 03/22/23 0500 03/23/23 0507  WBC 13.1* 10.8* 8.9 8.8  NEUTROABS 9.8*  --   --   --   HGB 12.6 11.5* 11.3* 11.5*  HCT 40.5 35.5* 35.4* 36.8  MCV 91.4 87.0 91.2 92.5  PLT 244 210 217 255   Basic Metabolic Panel: Recent Labs  Lab 03/19/23 1849 03/21/23 0502 03/22/23 0500 03/23/23 0507  NA 136 135 137 137  K 4.4 2.9* 3.7 3.5  CL 101 99 103 102  CO2 26 26 25 27   GLUCOSE 135* 218* 175* 266*  BUN 15 13 11 14   CREATININE 0.59 0.48 0.33* 0.53  CALCIUM 9.7 8.6* 8.7* 9.1  MG  --  1.1* 2.3 1.8  PHOS  --  2.6  --   --     Studies: No results found.  Scheduled Meds:  cholecalciferol  1,000 Units Oral Daily   dabigatran  150 mg Oral Q12H   fluticasone  2 spray Each Nare BID   gabapentin  100 mg  Oral TID   insulin aspart  0-15 Units Subcutaneous TID WC   insulin aspart  0-5 Units Subcutaneous QHS   loratadine  10 mg Oral Daily   melatonin  2.5 mg Oral QHS   methimazole  10 mg Oral Daily   metoprolol tartrate  50 mg Oral BID   midodrine  5 mg Oral TID WC   pantoprazole  40 mg Oral Daily   PARoxetine  30 mg Oral Daily   Continuous Infusions:  cefTRIAXone (ROCEPHIN)  IV Stopped (03/22/23 2235)   PRN Meds: acetaminophen, diclofenac Sodium, guaiFENesin, hydrALAZINE, ipratropium-albuterol, metoprolol tartrate, ondansetron (ZOFRAN) IV, polyethylene glycol, tiZANidine, ziprasidone  Time spent: 55 minutes  Author: Gillis Santa. MD [facility] 03/23/2023 2:18 PM  To reach On-call, see care teams to locate the attending and reach out to them via [email or web address]. If 7PM-7AM, please contact night-coverage If you still have difficulty reaching the attending provider, please page the [facility] (Director on Call) for [facility] on amion for assistance.

## 2023-03-23 NOTE — Progress Notes (Signed)
PHARMACIST - PHYSICIAN COMMUNICATION  DR:   Lucianne Muss  CONCERNING: IV to Oral Route Change Policy  RECOMMENDATION: This patient is receiving azithromycin by the intravenous route.  Based on criteria approved by the Pharmacy and Therapeutics Committee, the intravenous medication(s) is/are being converted to the equivalent oral dose form(s).   DESCRIPTION: These criteria include: The patient is eating (either orally or via tube) and/or has been taking other orally administered medications for a least 24 hours The patient has no evidence of active gastrointestinal bleeding or impaired GI absorption (gastrectomy, short bowel, patient on TNA or NPO).  If you have questions about this conversion, please contact the Pharmacy Department  []   [phone] )  [facility] [x]   [phone] )  [facility] []   [phone] )  [facility] []   [phone] )  [facility] []   [phone] )  [facility]   Elliot Gurney, PharmD, BCPS Clinical Pharmacist  03/23/2023 7:46 AM

## 2023-03-23 NOTE — Progress Notes (Signed)
Per MD Dr. Lucianne Muss do not give Pradaxa for morning or evening dose today 03/23/23. Made night shift nurse aware of same.

## 2023-03-23 NOTE — Plan of Care (Signed)
  Problem: Fluid Volume: Goal: Hemodynamic stability will improve Outcome: Progressing   Problem: Clinical Measurements: Goal: Diagnostic test results will improve Outcome: Progressing Goal: Signs and symptoms of infection will decrease Outcome: Progressing   Problem: Respiratory: Goal: Ability to maintain adequate ventilation will improve Outcome: Progressing   Problem: Education: Goal: Ability to describe self-care measures that may prevent or decrease complications (Diabetes Survival Skills Education) will improve Outcome: Progressing Goal: Individualized Educational Video(s) Outcome: Progressing   Problem: Coping: Goal: Ability to adjust to condition or change in health will improve Outcome: Progressing   Problem: Fluid Volume: Goal: Ability to maintain a balanced intake and output will improve Outcome: Progressing   Problem: Health Behavior/Discharge Planning: Goal: Ability to identify and utilize available resources and services will improve Outcome: Progressing Goal: Ability to manage health-related needs will improve Outcome: Progressing   Problem: Metabolic: Goal: Ability to maintain appropriate glucose levels will improve Outcome: Progressing   Problem: Nutritional: Goal: Maintenance of adequate nutrition will improve Outcome: Progressing Goal: Progress toward achieving an optimal weight will improve Outcome: Progressing   Problem: Skin Integrity: Goal: Risk for impaired skin integrity will decrease Outcome: Progressing   Problem: Tissue Perfusion: Goal: Adequacy of tissue perfusion will improve Outcome: Progressing   Problem: Education: Goal: Knowledge of General Education information will improve Description: Including pain rating scale, medication(s)/side effects and non-pharmacologic comfort measures Outcome: Progressing   Problem: Health Behavior/Discharge Planning: Goal: Ability to manage health-related needs will improve Outcome:  Progressing

## 2023-03-23 NOTE — [place] FL2 (Signed)
[place] MEDICAID FL2 LEVEL OF CARE FORM     IDENTIFICATION  Patient Name: Julie Acosta Birthdate: 1933/05/29 Sex: female Admission Date (Current Location): 03/19/2023  [place] and [place] Number:  Chiropodist and Address:  [facility], [address], [place], [place] [address]      Provider Number: [id number]  Attending Physician Name and Address:  Gillis Santa, MD  Relative Name and Phone Number:  [name] (Daughter)  [phone]    Current Level of Care: Hospital Recommended Level of Care: Skilled Nursing Facility Prior Approval Number:    Date Approved/Denied:   PASRR Number: [id number]  Discharge Plan: SNF    Current Diagnoses: Patient Active Problem List   Diagnosis Date Noted   Sepsis due to pneumonia (HCC) 03/20/2023   Acute lower UTI 03/20/2023   Acute metabolic encephalopathy 03/20/2023   Type 2 diabetes mellitus with peripheral neuropathy (HCC) 03/20/2023   Essential hypertension 03/20/2023   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   Hypercalcemia 02/16/2023   Hyperparathyroidism (HCC) 02/16/2023   Hyperthyroidism 12/19/2021   Thyroid nodule 12/19/2021   Paroxysmal atrial fibrillation (HCC) 12/17/2021   Type 2 MI (myocardial infarction) (HCC) 12/15/2021   PAD (peripheral artery disease) (HCC) 07/25/2020   Splenic infarct 07/23/2019   Coagulation disorder (HCC) 05/30/2019   Pain due to onychomycosis of toenail of right foot 10/18/2018   Breast cancer of upper-outer quadrant of left female breast (HCC) 07/27/2017   Obesity with body mass index of 30.0-39.9 07/27/2017   DVT (deep venous thrombosis) (HCC) 07/27/2017   Primary cancer of upper outer quadrant of left female breast (HCC) 06/12/2017   Diarrhea 01/16/2017   Chronic diarrhea 01/12/2017   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   Right leg swelling 10/13/2016   Benign breast lumps 05/30/2015   Cancer (HCC) 05/30/2015   Depression 05/30/2015   Type 2  diabetes mellitus with other specified complication, without long-term current use of insulin (HCC) 05/30/2015   Hyperlipidemia, unspecified 05/30/2015   Hypertension 05/30/2015   Osteoporosis, post-menopausal 05/30/2015   Panic attacks 05/30/2015    Orientation RESPIRATION BLADDER Height & Weight     Self  Normal Incontinent Weight: 156 lb 8.4 oz (71 kg) Height:  5\' 3"  (160 cm)  BEHAVIORAL SYMPTOMS/MOOD NEUROLOGICAL BOWEL NUTRITION STATUS      Incontinent    AMBULATORY STATUS COMMUNICATION OF NEEDS Skin     Verbally Normal                       Personal Care Assistance Level of Assistance              Functional Limitations Info             SPECIAL CARE FACTORS FREQUENCY                       Contractures Contractures Info: Not present    Additional Factors Info  Code Status, Allergies Code Status Info: DNR Allergies Info: Ciprofloxacin  Codeine  Tape  Ciprocinonide (Fluocinolone)  Amoxicillin  Cefuroxime  Latex  Lipitor (Atorvastatin)           Current Medications (03/23/2023):  This is the current hospital active medication list Current Facility-Administered Medications  Medication Dose Route Frequency Provider Last Rate Last Admin   acetaminophen (TYLENOL) tablet 650 mg  650 mg Oral Q6H PRN Amin, Ankit C, MD   650 mg at 03/22/23 1613   cefTRIAXone (ROCEPHIN) 2 g in sodium chloride 0.9 %  100 mL IVPB  2 g Intravenous Q24H Gillis Santa, MD       cholecalciferol (VITAMIN D3) 25 MCG (1000 UNIT) tablet 1,000 Units  1,000 Units Oral Daily Mansy, Jan A, MD   1,000 Units at 03/23/23 0848   dabigatran (PRADAXA) capsule 150 mg  150 mg Oral Q12H Mansy, Jan A, MD   150 mg at 03/22/23 2154   diclofenac Sodium (VOLTAREN) 1 % topical gel 4 g  4 g Topical QID PRN Mansy, Jan A, MD       fluticasone (FLONASE) 50 MCG/ACT nasal spray 2 spray  2 spray Each Nare BID Mansy, Jan A, MD   2 spray at 03/23/23 0847   gabapentin (NEURONTIN) capsule 100 mg  100 mg Oral TID  Mansy, Jan A, MD   100 mg at 03/23/23 0848   guaiFENesin (ROBITUSSIN) 100 MG/5ML liquid 5 mL  5 mL Oral Q4H PRN Amin, Ankit C, MD       hydrALAZINE (APRESOLINE) injection 10 mg  10 mg Intravenous Q4H PRN Amin, Ankit C, MD       insulin aspart (novoLOG) injection 0-15 Units  0-15 Units Subcutaneous TID WC Mansy, Jan A, MD   5 Units at 03/23/23 1301   insulin aspart (novoLOG) injection 0-5 Units  0-5 Units Subcutaneous QHS Mansy, Jan A, MD   2 Units at 03/20/23 2250   ipratropium-albuterol (DUONEB) 0.5-2.5 (3) MG/3ML nebulizer solution 3 mL  3 mL Nebulization Q4H PRN Amin, Ankit C, MD   3 mL at 03/20/23 1611   loratadine (CLARITIN) tablet 10 mg  10 mg Oral Daily Mansy, Jan A, MD   10 mg at 03/23/23 0848   melatonin tablet 2.5 mg  2.5 mg Oral QHS Mansy, Jan A, MD   2.5 mg at 03/22/23 2154   methimazole (TAPAZOLE) tablet 10 mg  10 mg Oral Daily Mansy, Jan A, MD   10 mg at 03/23/23 0848   metoprolol tartrate (LOPRESSOR) injection 5 mg  5 mg Intravenous Q4H PRN Amin, Ankit C, MD   5 mg at 03/21/23 0922   metoprolol tartrate (LOPRESSOR) tablet 50 mg  50 mg Oral BID Mansy, Jan A, MD   50 mg at 03/23/23 0848   midodrine (PROAMATINE) tablet 5 mg  5 mg Oral TID WC Gillis Santa, MD   5 mg at 03/23/23 1302   ondansetron (ZOFRAN) injection 4 mg  4 mg Intravenous Q6H PRN Amin, Ankit C, MD       pantoprazole (PROTONIX) EC tablet 40 mg  40 mg Oral Daily Mansy, Jan A, MD   40 mg at 03/23/23 0848   PARoxetine (PAXIL) tablet 30 mg  30 mg Oral Daily Mansy, Jan A, MD   30 mg at 03/23/23 0848   polyethylene glycol (MIRALAX / GLYCOLAX) packet 17 g  17 g Oral Daily PRN Mansy, Jan A, MD       QUEtiapine (SEROQUEL) tablet 50 mg  50 mg Oral QPM Gillis Santa, MD       tiZANidine (ZANAFLEX) tablet 2 mg  2 mg Oral Q12H PRN Mansy, Jan A, MD       traZODone (DESYREL) tablet 50 mg  50 mg Oral QHS Gillis Santa, MD       ziprasidone (GEODON) injection 10 mg  10 mg Intramuscular Q6H PRN Mansy, Jan A, MD   10 mg at 03/20/23 1136      Discharge Medications: Please see discharge summary for a list of discharge medications.  Relevant Imaging Results:  Relevant Lab Results:   Additional Information SS#: [id number]  Allena Katz, LCSW

## 2023-03-23 NOTE — Plan of Care (Signed)
  Problem: Education: Goal: Ability to describe self-care measures that may prevent or decrease complications (Diabetes Survival Skills Education) will improve Outcome: Not Progressing Goal: Individualized Educational Video(s) Outcome: Not Progressing  -----------------------------------------------------------------------------  Problem: Fluid Volume: Goal: Hemodynamic stability will improve Outcome: Progressing   Problem: Clinical Measurements: Goal: Diagnostic test results will improve Outcome: Progressing Goal: Signs and symptoms of infection will decrease Outcome: Progressing   Problem: Respiratory: Goal: Ability to maintain adequate ventilation will improve Outcome: Progressing   Problem: Coping: Goal: Ability to adjust to condition or change in health will improve Outcome: Progressing   Problem: Fluid Volume: Goal: Ability to maintain a balanced intake and output will improve Outcome: Progressing   Problem: Health Behavior/Discharge Planning: Goal: Ability to identify and utilize available resources and services will improve Outcome: Progressing Goal: Ability to manage health-related needs will improve Outcome: Progressing   Problem: Metabolic: Goal: Ability to maintain appropriate glucose levels will improve Outcome: Progressing   Problem: Nutritional: Goal: Maintenance of adequate nutrition will improve Outcome: Progressing Goal: Progress toward achieving an optimal weight will improve Outcome: Progressing   Problem: Skin Integrity: Goal: Risk for impaired skin integrity will decrease Outcome: Progressing   Problem: Tissue Perfusion: Goal: Adequacy of tissue perfusion will improve Outcome: Progressing   Problem: Education: Goal: Knowledge of General Education information will improve Description: Including pain rating scale, medication(s)/side effects and non-pharmacologic comfort measures Outcome: Progressing   Problem: Health Behavior/Discharge  Planning: Goal: Ability to manage health-related needs will improve Outcome: Progressing   Problem: Clinical Measurements: Goal: Ability to maintain clinical measurements within normal limits will improve Outcome: Progressing Goal: Will remain free from infection Outcome: Progressing Goal: Diagnostic test results will improve Outcome: Progressing Goal: Respiratory complications will improve Outcome: Progressing Goal: Cardiovascular complication will be avoided Outcome: Progressing   Problem: Activity: Goal: Risk for activity intolerance will decrease Outcome: Progressing   Problem: Nutrition: Goal: Adequate nutrition will be maintained Outcome: Progressing   Problem: Coping: Goal: Level of anxiety will decrease Outcome: Progressing   Problem: Elimination: Goal: Will not experience complications related to bowel motility Outcome: Progressing Goal: Will not experience complications related to urinary retention Outcome: Progressing   Problem: Pain Management: Goal: General experience of comfort will improve Outcome: Progressing   Problem: Safety: Goal: Ability to remain free from injury will improve Outcome: Progressing   Problem: Skin Integrity: Goal: Risk for impaired skin integrity will decrease Outcome: Progressing

## 2023-03-23 NOTE — Progress Notes (Signed)
PHARMACY CONSULT NOTE - FOLLOW UP  Pharmacy Consult for Electrolyte Monitoring and Replacement   Recent Labs: Potassium (mmol/L)  Date Value  03/23/2023 3.5  07/01/2013 4.6   Magnesium (mg/dL)  Date Value  40/98/1191 1.8   Calcium (mg/dL)  Date Value  47/82/9562 9.1   Calcium, Total (PTH) (mg/dL)  Date Value  13/12/6576 11.9 (H)   Albumin (g/dL)  Date Value  46/96/2952 2.8 (L)  07/01/2013 3.6   Phosphorus (mg/dL)  Date Value  84/13/2440 2.6   Sodium  Date Value  03/23/2023 137 mmol/L  10/16/2022 139  07/01/2013 139 mmol/L     Assessment: 89 y.o. Caucasian female with medical history significant for anxiety, depression, type 2 diabetes mellitus, GERD, hypertension and dyslipidemia, presented to the emergency room with acute onset of altered mental status with confusion, restlessness and occasional agitation as well as hypoxia at her SNF.     Goal of Therapy:  Electrolytes WNL  Plan:  ---no electrolyte replacement warranted for today ---recheck electrolytes in am    Elliot Gurney, PharmD, BCPS Clinical Pharmacist  03/23/2023 7:41 AM

## 2023-03-23 NOTE — Care Management Important Message (Signed)
Important Message  Patient Details  Name: Julie Acosta MRN: [id number] Date of Birth: 1934/04/19   Important Message Given:  N/A - LOS <3 / Initial given by admissions     [name] 03/23/2023, 8:43 AM

## 2023-03-24 DIAGNOSIS — J189 Pneumonia, unspecified organism: Secondary | ICD-10-CM | POA: Diagnosis not present

## 2023-03-24 DIAGNOSIS — A419 Sepsis, unspecified organism: Secondary | ICD-10-CM | POA: Diagnosis not present

## 2023-03-24 LAB — GLUCOSE, CAPILLARY
Glucose-Capillary: 105 mg/dL — ABNORMAL HIGH (ref 70–99)
Glucose-Capillary: 142 mg/dL — ABNORMAL HIGH (ref 70–99)
Glucose-Capillary: 145 mg/dL — ABNORMAL HIGH (ref 70–99)
Glucose-Capillary: 167 mg/dL — ABNORMAL HIGH (ref 70–99)
Glucose-Capillary: 192 mg/dL — ABNORMAL HIGH (ref 70–99)

## 2023-03-24 LAB — CULTURE, BLOOD (ROUTINE X 2)
Culture: NO GROWTH
Culture: NO GROWTH
Special Requests: ADEQUATE

## 2023-03-24 LAB — BASIC METABOLIC PANEL
Anion gap: 10 (ref 5–15)
BUN: 12 mg/dL (ref 8–23)
CO2: 29 mmol/L (ref 22–32)
Calcium: 9.6 mg/dL (ref 8.9–10.3)
Chloride: 105 mmol/L (ref 98–111)
Creatinine, Ser: 0.5 mg/dL (ref 0.44–1.00)
GFR, Estimated: >60 mL/min (ref >60)
Glucose, Bld: 158 mg/dL — ABNORMAL HIGH (ref 70–99)
Potassium: 3.6 mmol/L (ref 3.5–5.1)
Sodium: 141 mmol/L (ref 135–145)

## 2023-03-24 LAB — PHOSPHORUS: Phosphorus: 2.6 mg/dL (ref 2.5–4.6)

## 2023-03-24 LAB — CBC
HCT: 34.8 % — ABNORMAL LOW (ref 36.0–46.0)
Hemoglobin: 10.8 g/dL — ABNORMAL LOW (ref 12.0–15.0)
MCH: 29 pg (ref 26.0–34.0)
MCHC: 31 g/dL (ref 30.0–36.0)
MCV: 93.3 fL (ref 80.0–100.0)
Platelets: 247 10*3/uL (ref 150–400)
RBC: 3.73 MIL/uL — ABNORMAL LOW (ref 3.87–5.11)
RDW: 15.2 % (ref 11.5–15.5)
WBC: 9 10*3/uL (ref 4.0–10.5)
nRBC: 0 % (ref 0.0–0.2)

## 2023-03-24 LAB — MAGNESIUM: Magnesium: 1.8 mg/dL (ref 1.7–2.4)

## 2023-03-24 MED ORDER — TRAZODONE HCL 50 MG PO TABS: 25.0000 mg | ORAL_TABLET | Freq: Every day | ORAL | Status: DC

## 2023-03-24 MED ORDER — QUETIAPINE FUMARATE 25 MG PO TABS: 25.0000 mg | ORAL_TABLET | Freq: Every evening | ORAL | Status: DC

## 2023-03-24 MED ORDER — GABAPENTIN 100 MG PO CAPS
100.0000 mg | ORAL_CAPSULE | Freq: Three times a day (TID) | ORAL | Status: DC
  Administered 2023-03-25 – 2023-03-30 (×15): 100 mg via ORAL
  Filled 2023-03-24 (×16): qty 1

## 2023-03-24 MED ORDER — SODIUM CHLORIDE 0.9 % IV SOLN: INTRAVENOUS | Status: DC

## 2023-03-24 MED ORDER — TRAZODONE HCL 50 MG PO TABS: 25.0000 mg | ORAL_TABLET | Freq: Every evening | ORAL | Status: DC | PRN

## 2023-03-24 MED ORDER — DEXTROSE-SODIUM CHLORIDE 5-0.9 % IV SOLN: INTRAVENOUS | Status: DC

## 2023-03-24 NOTE — Progress Notes (Signed)
Patient sleeping most of day. Not eating or drinking. MD made aware of same.

## 2023-03-24 NOTE — Plan of Care (Signed)
  Problem: Fluid Volume: Goal: Hemodynamic stability will improve Outcome: Progressing   Problem: Clinical Measurements: Goal: Diagnostic test results will improve Outcome: Progressing Goal: Signs and symptoms of infection will decrease Outcome: Progressing   Problem: Respiratory: Goal: Ability to maintain adequate ventilation will improve Outcome: Progressing   Problem: Education: Goal: Ability to describe self-care measures that may prevent or decrease complications (Diabetes Survival Skills Education) will improve Outcome: Progressing Goal: Individualized Educational Video(s) Outcome: Progressing   Problem: Coping: Goal: Ability to adjust to condition or change in health will improve Outcome: Progressing   Problem: Fluid Volume: Goal: Ability to maintain a balanced intake and output will improve Outcome: Progressing   Problem: Health Behavior/Discharge Planning: Goal: Ability to identify and utilize available resources and services will improve Outcome: Progressing Goal: Ability to manage health-related needs will improve Outcome: Progressing   Problem: Metabolic: Goal: Ability to maintain appropriate glucose levels will improve Outcome: Progressing   Problem: Nutritional: Goal: Maintenance of adequate nutrition will improve Outcome: Progressing Goal: Progress toward achieving an optimal weight will improve Outcome: Progressing   Problem: Skin Integrity: Goal: Risk for impaired skin integrity will decrease Outcome: Progressing   Problem: Tissue Perfusion: Goal: Adequacy of tissue perfusion will improve Outcome: Progressing   Problem: Education: Goal: Knowledge of General Education information will improve Description: Including pain rating scale, medication(s)/side effects and non-pharmacologic comfort measures Outcome: Progressing   Problem: Health Behavior/Discharge Planning: Goal: Ability to manage health-related needs will improve Outcome:  Progressing

## 2023-03-24 NOTE — Progress Notes (Signed)
[facility] Progress Note  Patient: Julie Acosta    NWG:[id number]  DOA: 03/19/2023     Date of Service: the patient was seen and examined on 03/24/2023  Chief Complaint  Patient presents with   Altered Mental Status   Brief hospital course: 89 year old with history of anxiety, depression, DM 2, GERD, HTN, HLD comes to the ED with altered mental status, confusion, hypoxia.  Upon admission had mild lactic acidosis, leukocytosis with concerns of sepsis secondary to pneumonia and urinary tract infection.  Started on Rocephin and azithromycin.  Over the course of 48 hours her mentation significantly improved.     Assessment & Plan:  Principal Problem:   Sepsis due to pneumonia [facility]) Active Problems:   Acute lower UTI   Acute metabolic encephalopathy   Hyperthyroidism   Type 2 diabetes mellitus with peripheral neuropathy (HCC)   Essential hypertension     Sepsis due to pneumonia and urinary tract infection Fever curve slightly better. COVID/Flu/Resp Panel-negative, ProCal 0.17.  Blood cultures NGTD S/p azithromycin x 4 doses Continue ceftriaxone 1 g IV daily for 5 days IS, flutter, nebs.    Acute metabolic encephalopathy; resolved Much more awake today. CTH is neg, TSH/Ammonia/B12/folate = normal.  11/18 delirium and visual hallucinations, insomnia 11/19 decreased Seroquel 25 mg every evening and trazodone 25 mg nightly.  Decreased doses due to excessive sleepiness.    Hypokalemia/hypomagnesemia - Pharmacy pharmacy consulted to monitor and replete electrolytes   Volume overload Patient had elevated BNP and abnormal breath sounds Patient received IV fluid and IV electrolytes S/p IV Lasix 20 mg Currently patient is euvolemic, we will continue to monitor.    History of breast cancer status postlumpectomy 2019 History of sarcoma - Follows outpatient oncology   Factor V Leiden homozygous -Per oncology on lifelong Pradaxa 150 mg twice daily   Hematuria noticed on  11/18 Hold Pradaxa for today, we will reassess tomorrow a.m. Follow renal sonogram and bladder scan  Essential hypertension - continue antihypertensive therapy.   Type 2 diabetes mellitus with peripheral neuropathy (HCC) Hold PO meds, ISS and accuchecks.  A1c 6.5   Hyperthyroidism TSH, Methomazole.    GERD PPI   PT/OT= wheelchair bound but does do basic movements   Body mass index is 27.73 kg/m.  Interventions:  Diet Orders (From admission, onward)     Start     Ordered   03/20/23 0833  Diet Carb Modified Fluid consistency: Thin; Room service appropriate? Yes  Diet effective now       Question Answer Comment  Diet-HS Snack? Nothing   Calorie Level Medium 1600-2000   Fluid consistency: Thin   Room service appropriate? Yes      03/20/23 0832            DVT Prophylaxis: Pradaxa, held due to hematuria  Advance goals of care discussion: DNR  Family Communication: family was not present at bedside, at the time of interview.  The pt provided permission to discuss medical plan with the family. Opportunity was given to ask question and all questions were answered satisfactorily.  11/19 discussed with patient's daughter over the phone  Disposition:  Pt is from SNF, admitted with AMS due to PNA and UTI, still has AMS, which precludes a safe discharge. Discharge to SNF, when stable, most likely discharge in 1 to 2 days.  Subjective: No significant events overnight, in the morning time patient woke up and she was very sleepy but AO x 2 to 3 really confused, no delusions or  hallucinations noticed. Urine is clearing up, hematuria is decreasing.  RN was advised to keep eye on urine output. Patient was resting comfortably, eating breakfast, denied any complaints.  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 at age 89 due to cancer Neurologic: without any new focal findings Gait not checked due to wheel chair bound at baseline   Vitals:   03/23/23 1615 03/23/23 2054 03/24/23 0405 03/24/23 0823  BP: 131/69 (!) 141/56 (!) 108/90 (!) 153/62  Pulse: 64 76 70 66  Resp: 16 18 18 17   Temp: 98.2 F (36.8 C) 97.8 F (36.6 C) 98.2 F (36.8 C) 98.4 F (36.9 C)  TempSrc:   Oral   SpO2: 99% 98% 99% 99%  Weight:      Height:        Intake/Output Summary (Last 24 hours) at 03/24/2023 1102 Last data filed at 03/24/2023 1478 Gross per 24 hour  Intake 131.98 ml  Output 600 ml  Net -468.02 ml   Filed Weights   03/20/23 0400 03/20/23 0447 03/20/23 1717  Weight: 72 kg 72 kg 71 kg    Data Reviewed: I have personally reviewed and interpreted daily labs, tele strips, imagings as discussed above. I reviewed all nursing notes, pharmacy notes, vitals, pertinent old records I have discussed plan of care as described above with RN and patient/family.  CBC: Recent Labs  Lab 03/19/23 1849 03/21/23 0502 03/22/23 0500 03/23/23 0507 03/24/23 0502  WBC 13.1* 10.8* 8.9 8.8 9.0  NEUTROABS 9.8*  --   --   --   --   HGB 12.6 11.5* 11.3* 11.5* 10.8*  HCT 40.5 35.5* 35.4* 36.8 34.8*  MCV 91.4 87.0 91.2 92.5 93.3  PLT 244 210 217 255 247   Basic Metabolic Panel: Recent Labs  Lab 03/19/23 1849 03/21/23 0502 03/22/23 0500 03/23/23 0507 03/24/23 0502  NA 136 135 137 137 141  K 4.4 2.9* 3.7 3.5 3.6  CL 101 99 103 102 105  CO2 26 26 25 27 29   GLUCOSE 135* 218* 175* 266* 158*  BUN 15 13 11 14 12   CREATININE 0.59 0.48 0.33* 0.53 0.50  CALCIUM 9.7 8.6* 8.7* 9.1 9.6  MG  --  1.1* 2.3 1.8 1.8  PHOS  --  2.6  --   --  2.6     Studies: US RENAL  Result Date: 03/23/2023 CLINICAL DATA:  Hematuria EXAM: RENAL / URINARY TRACT ULTRASOUND COMPLETE COMPARISON:  CT 06/27/2022. FINDINGS: Right Kidney: Renal measurements: 11.4 x 5.3 x 4.8 cm = volume: 151.3 mL. Mild renal atrophy. No collecting system dilatation or perinephric fluid. Small renal stones identified measuring up to 5 mm. Left Kidney: Renal measurements: 11.2 x 4.7 x 4.3 cm = volume: 119.5 mL. No collecting system dilatation. Perinephric fluid. Renal stones again seen measuring up to 4 mm by ultrasound Bladder: Appears normal for degree of bladder distention. Other: None. IMPRESSION: No collecting system dilatation. Bilateral renal stones. Please correlate with the prior CT scan Electronically Signed   By: Karen Kays M.D.   On: 03/23/2023 18:03    Scheduled Meds:  cholecalciferol  1,000 Units Oral Daily   dabigatran  150 mg Oral Q12H   fluticasone  2 spray Each Nare BID   gabapentin  100 mg Oral TID   insulin aspart  0-15 Units Subcutaneous TID WC   insulin aspart  0-5 Units Subcutaneous QHS   loratadine  10 mg Oral Daily   melatonin  2.5 mg Oral QHS   methimazole  10 mg Oral Daily   metoprolol tartrate  50 mg Oral BID   midodrine  5 mg Oral TID WC   pantoprazole  40 mg Oral Daily   PARoxetine  30 mg Oral Daily   QUEtiapine  25 mg Oral QPM   traZODone  25 mg Oral QHS   Continuous Infusions:  cefTRIAXone (ROCEPHIN)  IV Stopped (03/23/23 2316)   PRN Meds: acetaminophen, diclofenac Sodium, guaiFENesin, hydrALAZINE, ipratropium-albuterol, metoprolol tartrate, ondansetron (ZOFRAN) IV, polyethylene glycol, tiZANidine, ziprasidone  Time spent: 40 minutes  Author: Gillis Santa. MD [facility] 03/24/2023 11:02 AM  To reach On-call, see care teams to locate the attending and reach out to them via [email or web address]. If 7PM-7AM, please contact night-coverage If you still have difficulty reaching the attending provider, please page the [facility] (Director on  Call) for [facility] on amion for assistance.

## 2023-03-24 NOTE — Progress Notes (Signed)
PHARMACY CONSULT NOTE - FOLLOW UP  Pharmacy Consult for Electrolyte Monitoring and Replacement   Recent Labs: Potassium (mmol/L)  Date Value  03/24/2023 3.6  07/01/2013 4.6   Magnesium (mg/dL)  Date Value  16/02/9603 1.8   Calcium (mg/dL)  Date Value  54/01/8118 9.6   Calcium, Total (PTH) (mg/dL)  Date Value  14/78/2956 11.9 (H)   Albumin (g/dL)  Date Value  21/30/8657 2.8 (L)  07/01/2013 3.6   Phosphorus (mg/dL)  Date Value  84/69/6295 2.6   Sodium  Date Value  03/24/2023 141 mmol/L  10/16/2022 139  07/01/2013 139 mmol/L     Assessment: 89 y.o. Caucasian female with medical history significant for anxiety, depression, type 2 diabetes mellitus, GERD, hypertension and dyslipidemia, presented to the emergency room with acute onset of altered mental status with confusion, restlessness and occasional agitation as well as hypoxia at her SNF.     Goal of Therapy:  Electrolytes WNL  Plan:  ---no electrolyte replacement warranted for today ---recheck electrolytes in am  Elliot Gurney, PharmD, BCPS Clinical Pharmacist  03/24/2023 7:36 AM

## 2023-03-25 DIAGNOSIS — J189 Pneumonia, unspecified organism: Secondary | ICD-10-CM | POA: Diagnosis not present

## 2023-03-25 DIAGNOSIS — A419 Sepsis, unspecified organism: Secondary | ICD-10-CM | POA: Diagnosis not present

## 2023-03-25 LAB — CBC
HCT: 37.1 % (ref 36.0–46.0)
Hemoglobin: 11.4 g/dL — ABNORMAL LOW (ref 12.0–15.0)
MCH: 28.7 pg (ref 26.0–34.0)
MCHC: 30.7 g/dL (ref 30.0–36.0)
MCV: 93.5 fL (ref 80.0–100.0)
Platelets: 252 10*3/uL (ref 150–400)
RBC: 3.97 MIL/uL (ref 3.87–5.11)
RDW: 15.1 % (ref 11.5–15.5)
WBC: 9.8 10*3/uL (ref 4.0–10.5)
nRBC: 0 % (ref 0.0–0.2)

## 2023-03-25 LAB — BASIC METABOLIC PANEL
Anion gap: 3 — ABNORMAL LOW (ref 5–15)
BUN: 12 mg/dL (ref 8–23)
CO2: 29 mmol/L (ref 22–32)
Calcium: 8.8 mg/dL — ABNORMAL LOW (ref 8.9–10.3)
Chloride: 109 mmol/L (ref 98–111)
Creatinine, Ser: 0.41 mg/dL — ABNORMAL LOW (ref 0.44–1.00)
GFR, Estimated: >60 mL/min (ref >60)
Glucose, Bld: 215 mg/dL — ABNORMAL HIGH (ref 70–99)
Potassium: 3.9 mmol/L (ref 3.5–5.1)
Sodium: 141 mmol/L (ref 135–145)

## 2023-03-25 LAB — GLUCOSE, CAPILLARY
Glucose-Capillary: 176 mg/dL — ABNORMAL HIGH (ref 70–99)
Glucose-Capillary: 190 mg/dL — ABNORMAL HIGH (ref 70–99)
Glucose-Capillary: 197 mg/dL — ABNORMAL HIGH (ref 70–99)
Glucose-Capillary: 204 mg/dL — ABNORMAL HIGH (ref 70–99)
Glucose-Capillary: 227 mg/dL — ABNORMAL HIGH (ref 70–99)

## 2023-03-25 LAB — PHOSPHORUS: Phosphorus: 2 mg/dL — ABNORMAL LOW (ref 2.5–4.6)

## 2023-03-25 LAB — MAGNESIUM: Magnesium: 1.5 mg/dL — ABNORMAL LOW (ref 1.7–2.4)

## 2023-03-25 MED ORDER — MAGNESIUM SULFATE 4 GM/100ML IV SOLN
4.0000 g | Freq: Once | INTRAVENOUS | Status: AC
  Administered 2023-03-25: 4 g via INTRAVENOUS
  Filled 2023-03-25: qty 100

## 2023-03-25 MED ORDER — MAGNESIUM SULFATE 2 GM/50ML IV SOLN: 2.0000 g | Freq: Once | INTRAVENOUS | Status: DC

## 2023-03-25 MED ORDER — POTASSIUM & SODIUM PHOSPHATES 280-160-250 MG PO PACK
2.0000 | PACK | Freq: Once | ORAL | Status: AC
  Administered 2023-03-25: 2 via ORAL
  Filled 2023-03-25: qty 2

## 2023-03-25 NOTE — Progress Notes (Signed)
PHARMACY CONSULT NOTE - FOLLOW UP  Pharmacy Consult for Electrolyte Monitoring and Replacement   Recent Labs: Potassium (mmol/L)  Date Value  03/25/2023 3.9  07/01/2013 4.6   Magnesium (mg/dL)  Date Value  28/41/3244 1.5 (L)   Calcium (mg/dL)  Date Value  05/07/7251 8.8 (L)   Calcium, Total (PTH) (mg/dL)  Date Value  66/44/0347 11.9 (H)   Albumin (g/dL)  Date Value  42/59/5638 2.8 (L)  07/01/2013 3.6   Phosphorus (mg/dL)  Date Value  75/64/3329 2.0 (L)   Sodium  Date Value  03/25/2023 141 mmol/L  10/16/2022 139  07/01/2013 139 mmol/L     Assessment: 89 y.o. Caucasian female with medical history significant for anxiety, depression, type 2 diabetes mellitus, GERD, hypertension and dyslipidemia, presented to the emergency room with acute onset of altered mental status with confusion, restlessness and occasional agitation as well as hypoxia at her SNF.     Goal of Therapy:  Electrolytes WNL  Plan:  ---Mag 1.5 Magnesium sulfate IV 4 grams x 1 --Phos 2.0 Kphos PO 2 packets x 1 ---recheck electrolytes in am  Elliot Gurney, PharmD, BCPS Clinical Pharmacist  03/25/2023 7:34 AM

## 2023-03-25 NOTE — Progress Notes (Signed)
Physical Therapy Treatment Patient Details Name: Julie Acosta MRN: [id number] DOB: 1933-07-26 Today's Date: 03/25/2023   History of Present Illness Pt is an 89 y/o F admitted on 03/19/23 after presenting with AMS, confusion, hypoxia. Pt is being treated for sepsis 2/2 PNA & UTI. 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 Agreeable to attempting OOB. Author called [facility] to confirm PLOF. Pt was able to STS via sara lift. Currently, pt attempted to stand but was only able to clear hips one time with max-total assist of one. Pt gets SOB with minimal activity.   Acute PT will continue to follow and progress as able per current POC.    If plan is discharge home, recommend the following: Two people to help with walking and/or transfers;Two people to help with bathing/dressing/bathroom;Direct supervision/assist for medications management     Equipment Recommendations  None recommended by PT       Precautions / Restrictions Precautions Precautions: Fall Precaution Comments: L hemipelvectomy & amputation Restrictions Weight Bearing Restrictions: No     Mobility  Bed Mobility Overal bed mobility: Needs Assistance Bed Mobility: Rolling, Supine to Sit, Sit to Supine  Supine to sit: Mod assist, Used rails, HOB elevated Sit to supine: Mod assist, Used rails   Transfers Overall transfer level: Needs assistance Equipment used:  [place] Dec steady) Transfers: Sit to/from Stand  General transfer comment: Attempted STS 3 x via sara steady. Pt puts forth great effort but was unable.Was able to clear hip 1 x but unable to stand fully erect.    Ambulation/Gait  General Gait Details: Non ambulatory at baseline    Balance Overall balance assessment: Needs assistance Sitting-balance support: Feet supported, Bilateral upper extremity supported,  Feet unsupported Sitting balance-Leahy Scale: Fair     Standing balance support: Bilateral upper extremity supported, During functional activity Standing balance-Leahy Scale: Zero       Cognition Arousal: Alert Behavior During Therapy: WFL for tasks assessed/performed Overall Cognitive Status: Within Functional Limits for tasks assessed      General Comments: Pt was A and oriented and agreeable to session               Pertinent Vitals/Pain Pain Assessment Pain Assessment: No/denies pain Breathing: normal     PT Goals (current goals can now be found in the care plan section) Acute Rehab PT Goals Patient Stated Goal: none stated Progress towards PT goals: Progressing toward goals    Frequency    Min 1X/week           Co-evaluation     PT goals addressed during session: Mobility/safety with mobility;Balance;Proper use of DME;Strengthening/ROM        AM-PAC PT "6 Clicks" Mobility   Outcome Measure  Help needed turning from your back to your side while in a flat bed without using bedrails?: A Little Help needed moving from lying on your back to sitting on the side of a flat bed without using bedrails?: A Lot Help needed moving to and from a bed to a chair (including a wheelchair)?: Total Help needed standing up from a chair using your arms (e.g., wheelchair or bedside chair)?: Total Help needed to walk in hospital room?: Total Help needed climbing 3-5 steps with a railing? : Total 6 Click Score: 9    End of Session Equipment Utilized During Treatment: Oxygen (3L  throughout session) Activity Tolerance: Patient tolerated treatment well;Patient limited by fatigue Patient left: in bed;with call bell/phone within reach;with bed alarm set Nurse Communication: Mobility status PT Visit Diagnosis: Muscle weakness (generalized) (M62.81);Other abnormalities of gait and mobility (R26.89)     Time: 1406-1430 PT Time Calculation (min) (ACUTE ONLY): 24  min  Charges:    $Therapeutic Activity: 23-37 mins PT General Charges $$ ACUTE PT VISIT: 1 Visit                    Jetta Lout PTA 03/25/23, 4:34 PM

## 2023-03-25 NOTE — Progress Notes (Signed)
[facility] Progress Note  Patient: Julie Acosta    ZOX:[id number]  DOA: 03/19/2023     Date of Service: the patient was seen and examined on 03/25/2023  Chief Complaint  Patient presents with   Altered Mental Status   Brief hospital course: 89 year old with history of anxiety, depression, DM 2, GERD, HTN, HLD comes to the ED with altered mental status, confusion, hypoxia.  Upon admission had mild lactic acidosis, leukocytosis with concerns of sepsis secondary to pneumonia and urinary tract infection.  Started on Rocephin and azithromycin.  Over the course of 48 hours her mentation significantly improved.     Assessment & Plan:  Principal Problem:   Sepsis due to pneumonia [facility]) Active Problems:   Acute lower UTI   Acute metabolic encephalopathy   Hyperthyroidism   Type 2 diabetes mellitus with peripheral neuropathy (HCC)   Essential hypertension     Sepsis due to pneumonia and urinary tract infection Fever curve slightly better. COVID/Flu/Resp Panel-negative, ProCal 0.17.  Blood cultures NGTD S/p azithromycin x 4 doses S/p ceftriaxone 1 g IV daily for 5 days.  Completed course IS, flutter, nebs.    Acute metabolic encephalopathy; resolved Much more awake today. CTH is neg, TSH/Ammonia/B12/folate = normal.  11/18 delirium and visual hallucinations, insomnia 11/19 d/c'd Seroquel and trazodone 25 mg nightly prn for sleep. Changed due to excessive sleepiness. 11/20 today patient is more awake and alert    Hypokalemia, potassium repleted. hypomagnesemia, mag repleted. Hypophosphatemia, phosphorus repleted. - Pharmacy pharmacy consulted to monitor and replete electrolytes   Volume overload Patient had elevated BNP and abnormal breath sounds Patient received IV fluid and IV electrolytes S/p IV Lasix 20 mg Currently patient is euvolemic, we will continue to monitor.    History of breast cancer status postlumpectomy 2019 History of sarcoma - Follows outpatient  oncology   Factor V Leiden homozygous -Per oncology on lifelong Pradaxa 150 mg twice daily   Hematuria noticed on 11/18 Held Pradaxa for 3 doses US renal: No collecting system dilatation. Bilateral renal stones. Please correlate with the prior CT sca   Essential hypertension - continue antihypertensive therapy.   Type 2 diabetes mellitus with peripheral neuropathy (HCC) Hold PO meds, ISS and accuchecks.  A1c 6.5   Hyperthyroidism TSH, Methomazole.    GERD PPI   PT/OT= wheelchair bound but does do basic movements   Body mass index is 27.73 kg/m.  Interventions:  Diet Orders (From admission, onward)     Start     Ordered   03/20/23 0833  Diet Carb Modified Fluid consistency: Thin; Room service appropriate? Yes  Diet effective now       Question Answer Comment  Diet-HS Snack? Nothing   Calorie Level Medium 1600-2000   Fluid consistency: Thin   Room service appropriate? Yes      03/20/23 0832            DVT Prophylaxis: Pradaxa, held due to hematuria  Advance goals of care discussion: DNR  Family Communication: family was not present at bedside, at the time of interview.  The pt provided permission to discuss medical plan with the family. Opportunity was given to ask question and all questions were answered satisfactorily.  11/19 discussed with patient's daughter over the phone  Disposition:  Pt is from SNF, admitted with AMS due to PNA and UTI, still has AMS, which precludes a safe discharge. Discharge to SNF, when stable, most likely discharge in 1 to 2 days.  Subjective: No significant events overnight,  today patient is more awake and alert.  We will continue to monitor and replete electrolytes.  Discharge planning tomorrow a.m.   Physical Exam: General: NAD, lying comfortably Appear in no distress, affect appropriate Eyes: PERRLA ENT: Oral Mucosa Clear, moist  Neck: no JVD,  Cardiovascular: S1 and S2 Present, no Murmur,  Respiratory: good  respiratory effort, Bilateral Air entry equal and Decreased, no Crackles, no wheezes Abdomen: Bowel Sound present, Soft and no tenderness,  Skin: no rashes Extremities: RLE no Pedal edema and no calf tenderness, s/p LLE amputation due to left hemipelvis surgery since 1962 at age 37 due to cancer Neurologic: without any new focal findings Gait not checked due to wheel chair bound at baseline   Vitals:   03/24/23 1626 03/24/23 2039 03/25/23 0441 03/25/23 0808  BP: (!) 148/63 (!) 154/67 (!) 118/98 (!) 143/45  Pulse: 60 68 72 70  Resp: 18 18 18 19   Temp: 97.8 F (36.6 C) 97.7 F (36.5 C) 98 F (36.7 C) 99.6 F (37.6 C)  TempSrc:  Oral    SpO2: 99% 100% 97% 97%  Weight:      Height:        Intake/Output Summary (Last 24 hours) at 03/25/2023 1212 Last data filed at 03/25/2023 0300 Gross per 24 hour  Intake 51.99 ml  Output 600 ml  Net -548.01 ml   Filed Weights   03/20/23 0400 03/20/23 0447 03/20/23 1717  Weight: 72 kg 72 kg 71 kg    Data Reviewed: I have personally reviewed and interpreted daily labs, tele strips, imagings as discussed above. I reviewed all nursing notes, pharmacy notes, vitals, pertinent old records I have discussed plan of care as described above with RN and patient/family.  CBC: Recent Labs  Lab 03/19/23 1849 03/21/23 0502 03/22/23 0500 03/23/23 0507 03/24/23 0502 03/25/23 0512  WBC 13.1* 10.8* 8.9 8.8 9.0 9.8  NEUTROABS 9.8*  --   --   --   --   --   HGB 12.6 11.5* 11.3* 11.5* 10.8* 11.4*  HCT 40.5 35.5* 35.4* 36.8 34.8* 37.1  MCV 91.4 87.0 91.2 92.5 93.3 93.5  PLT 244 210 217 255 247 252   Basic Metabolic Panel: Recent Labs  Lab 03/21/23 0502 03/22/23 0500 03/23/23 0507 03/24/23 0502 03/25/23 0512  NA 135 137 137 141 141  K 2.9* 3.7 3.5 3.6 3.9  CL 99 103 102 105 109  CO2 26 25 27 29 29   GLUCOSE 218* 175* 266* 158* 215*  BUN 13 11 14 12 12   CREATININE 0.48 0.33* 0.53 0.50 0.41*  CALCIUM 8.6* 8.7* 9.1 9.6 8.8*  MG 1.1* 2.3 1.8  1.8 1.5*  PHOS 2.6  --   --  2.6 2.0*    Studies: No results found.  Scheduled Meds:  cholecalciferol  1,000 Units Oral Daily   dabigatran  150 mg Oral Q12H   fluticasone  2 spray Each Nare BID   gabapentin  100 mg Oral TID   insulin aspart  0-15 Units Subcutaneous TID WC   insulin aspart  0-5 Units Subcutaneous QHS   loratadine  10 mg Oral Daily   melatonin  2.5 mg Oral QHS   methimazole  10 mg Oral Daily   metoprolol tartrate  50 mg Oral BID   midodrine  5 mg Oral TID WC   pantoprazole  40 mg Oral Daily   PARoxetine  30 mg Oral Daily   Continuous Infusions:  magnesium sulfate bolus IVPB 4 g (03/25/23 1015)  PRN Meds: acetaminophen, diclofenac Sodium, guaiFENesin, hydrALAZINE, ipratropium-albuterol, metoprolol tartrate, ondansetron (ZOFRAN) IV, polyethylene glycol, tiZANidine, traZODone, ziprasidone  Time spent: 40 minutes  Author: Gillis Santa. MD [facility] 03/25/2023 12:12 PM  To reach On-call, see care teams to locate the attending and reach out to them via [email or web address]. If 7PM-7AM, please contact night-coverage If you still have difficulty reaching the attending provider, please page the [facility] (Director on Call) for [facility] on amion for assistance.

## 2023-03-25 NOTE — Plan of Care (Signed)

## 2023-03-25 NOTE — Care Management Important Message (Signed)
Important Message  Patient Details  Name: Julie Acosta MRN: [id number] Date of Birth: 03-Apr-1934   Important Message Given:  Yes - Medicare IM     [name] 03/25/2023, 2:16 PM

## 2023-03-26 ENCOUNTER — Inpatient Hospital Stay: Payer: Medicare Other

## 2023-03-26 DIAGNOSIS — A419 Sepsis, unspecified organism: Secondary | ICD-10-CM | POA: Diagnosis not present

## 2023-03-26 DIAGNOSIS — J189 Pneumonia, unspecified organism: Secondary | ICD-10-CM | POA: Diagnosis not present

## 2023-03-26 LAB — BASIC METABOLIC PANEL
Anion gap: 9 (ref 5–15)
BUN: 11 mg/dL (ref 8–23)
CO2: 30 mmol/L (ref 22–32)
Calcium: 9.2 mg/dL (ref 8.9–10.3)
Chloride: 103 mmol/L (ref 98–111)
Creatinine, Ser: 0.5 mg/dL (ref 0.44–1.00)
GFR, Estimated: >60 mL/min (ref >60)
Glucose, Bld: 187 mg/dL — ABNORMAL HIGH (ref 70–99)
Potassium: 3.7 mmol/L (ref 3.5–5.1)
Sodium: 142 mmol/L (ref 135–145)

## 2023-03-26 LAB — CBC
HCT: 35.8 % — ABNORMAL LOW (ref 36.0–46.0)
Hemoglobin: 11.1 g/dL — ABNORMAL LOW (ref 12.0–15.0)
MCH: 28.7 pg (ref 26.0–34.0)
MCHC: 31 g/dL (ref 30.0–36.0)
MCV: 92.5 fL (ref 80.0–100.0)
Platelets: 275 10*3/uL (ref 150–400)
RBC: 3.87 MIL/uL (ref 3.87–5.11)
RDW: 15.4 % (ref 11.5–15.5)
WBC: 11.7 10*3/uL — ABNORMAL HIGH (ref 4.0–10.5)
nRBC: 0 % (ref 0.0–0.2)

## 2023-03-26 LAB — GLUCOSE, CAPILLARY
Glucose-Capillary: 207 mg/dL — ABNORMAL HIGH (ref 70–99)
Glucose-Capillary: 201 mg/dL — ABNORMAL HIGH (ref 70–99)
Glucose-Capillary: 232 mg/dL — ABNORMAL HIGH (ref 70–99)
Glucose-Capillary: 201 mg/dL — ABNORMAL HIGH (ref 70–99)

## 2023-03-26 LAB — MAGNESIUM: Magnesium: 2 mg/dL (ref 1.7–2.4)

## 2023-03-26 LAB — PHOSPHORUS: Phosphorus: 1.8 mg/dL — ABNORMAL LOW (ref 2.5–4.6)

## 2023-03-26 LAB — PROCALCITONIN: Procalcitonin: <0.1 ng/mL

## 2023-03-26 MED ORDER — POTASSIUM PHOSPHATES 15 MMOLE/5ML IV SOLN
30.0000 mmol | Freq: Once | INTRAVENOUS | Status: DC
  Filled 2023-03-26: qty 10

## 2023-03-26 MED ORDER — FUROSEMIDE 10 MG/ML IJ SOLN
40.0000 mg | Freq: Once | INTRAMUSCULAR | Status: AC
  Administered 2023-03-26: 40 mg via INTRAVENOUS
  Filled 2023-03-26: qty 4

## 2023-03-26 MED ORDER — DABIGATRAN ETEXILATE MESYLATE 150 MG PO CAPS: 150.0000 mg | ORAL_CAPSULE | Freq: Two times a day (BID) | ORAL | Status: DC

## 2023-03-26 MED ORDER — MIDODRINE HCL 5 MG PO TABS: 5.0000 mg | ORAL_TABLET | Freq: Three times a day (TID) | ORAL | Status: DC | PRN

## 2023-03-26 MED ORDER — POTASSIUM & SODIUM PHOSPHATES 280-160-250 MG PO PACK
2.0000 | PACK | ORAL | Status: AC
  Administered 2023-03-26 (×2): 2 via ORAL
  Filled 2023-03-26 (×2): qty 2

## 2023-03-26 NOTE — Progress Notes (Signed)
Occupational Therapy Treatment Patient Details Name: Julie Acosta MRN: [id number] DOB: Sep 21, 1933 Today's Date: 03/26/2023   History of present illness Pt is an 89 y/o F admitted on 03/19/23 after presenting with AMS, confusion, hypoxia. Pt is being treated for sepsis 2/2 PNA & UTI. 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 Pt disoriented to year and some to situation but able to follow commands and pleasant. Pt required MOD A for sup<>sit EOB and required LUE propped on elevated HOB to compensate for slight L lateral lean (due to hx of hemipelvectomy). Pt tolerated EOB grooming tasks with limited set up and supv. SpO2 on 3L O2 in low 90's. Attempted to wean to room air but SpO2 dropped to 85% so placed back on 3L and with VC for PLB and once returned to bed able to maintain >91%. Pt required +2 to boost up in bed as pt endorsed not having the energy/strength to complete at end of session. Pt continues to benefit from skilled OT services.       If plan is discharge home, recommend the following:  Two people to help with walking and/or transfers;Two people to help with bathing/dressing/bathroom;Assistance with cooking/housework;Assist for transportation;Help with stairs or ramp for entrance;Supervision due to cognitive status;Direct supervision/assist for medications management;Direct supervision/assist for financial management   Equipment Recommendations  Other (comment) (defer to next venue)    Recommendations for Other Services      Precautions / Restrictions Precautions Precautions: Fall Precaution Comments: L hemipelvectomy & amputation Restrictions Weight Bearing Restrictions: No       Mobility Bed Mobility Overal bed mobility: Needs Assistance Bed Mobility: Supine to Sit, Sit to Supine     Supine to sit: Mod assist, HOB elevated, Used  rails Sit to supine: Mod assist, Used rails   General bed mobility comments: +2 for boost up in bed    Transfers                         Balance Overall balance assessment: Needs assistance Sitting-balance support: Feet unsupported, Feet supported, Single extremity supported Sitting balance-Leahy Scale: Fair Sitting balance - Comments: fair-, LUE propped on HOB                                   ADL either performed or assessed with clinical judgement   ADL Overall ADL's : Needs assistance/impaired     Grooming: Sitting;Wash/dry face;Oral care;Supervision/safety;Set up Grooming Details (indicate cue type and reason): Pt leaning on L elbow slightly with HOB elevated, unable to tolerate sitting wihtout UE support                                    Extremity/Trunk Assessment              Vision       Perception     Praxis      Cognition Arousal: Alert Behavior During Therapy: WFL for tasks assessed/performed Overall Cognitive Status: No family/caregiver present to determine baseline cognitive functioning  General Comments: alert and oriented to self, place, month, and president; not oriented to date        Exercises Other Exercises Other Exercises: VC for PLB    Shoulder Instructions       General Comments On 3L, attempted to wean to room air but SpO2 dropped to 85% so placed back on 3L and with VC for PLB and once returned to bed able to maintain >91%.    Pertinent Vitals/ Pain       Pain Assessment Pain Assessment: No/denies pain  Home Living                                          Prior Functioning/Environment              Frequency  Min 1X/week        Progress Toward Goals  OT Goals(current goals can now be found in the care plan section)  Progress towards OT goals: Progressing toward goals  Acute Rehab OT Goals Patient Stated  Goal: get better OT Goal Formulation: With patient Time For Goal Achievement: 04/05/23 Potential to Achieve Goals: Good  Plan      Co-evaluation                 AM-PAC OT "6 Clicks" Daily Activity     Outcome Measure   Help from another person eating meals?: None Help from another person taking care of personal grooming?: A Little Help from another person toileting, which includes using toliet, bedpan, or urinal?: Total Help from another person bathing (including washing, rinsing, drying)?: A Lot Help from another person to put on and taking off regular upper body clothing?: A Lot Help from another person to put on and taking off regular lower body clothing?: A Lot 6 Click Score: 14    End of Session    OT Visit Diagnosis: Other abnormalities of gait and mobility (R26.89);Muscle weakness (generalized) (M62.81)   Activity Tolerance Patient tolerated treatment well   Patient Left in bed;with call bell/phone within reach;with bed alarm set   Nurse Communication          Time: 5956-3875 OT Time Calculation (min): 23 min  Charges: OT General Charges $OT Visit: 1 Visit OT Treatments $Self Care/Home Management : 8-22 mins  Arman Filter., MPH, MS, OTR/L ascom [phone] 03/26/23, 11:55 AM

## 2023-03-26 NOTE — Progress Notes (Signed)
PHARMACY CONSULT NOTE - FOLLOW UP  Pharmacy Consult for Electrolyte Monitoring and Replacement   Recent Labs: Potassium (mmol/L)  Date Value  03/26/2023 3.7  07/01/2013 4.6   Magnesium (mg/dL)  Date Value  74/25/9563 2.0   Calcium (mg/dL)  Date Value  87/56/4332 9.2   Calcium, Total (PTH) (mg/dL)  Date Value  95/18/8416 11.9 (H)   Albumin (g/dL)  Date Value  60/63/0160 2.8 (L)  07/01/2013 3.6   Phosphorus (mg/dL)  Date Value  10/93/2355 1.8 (L)   Sodium  Date Value  03/26/2023 142 mmol/L  10/16/2022 139  07/01/2013 139 mmol/L     Assessment: 89 y.o. Caucasian female with medical history significant for anxiety, depression, type 2 diabetes mellitus, GERD, hypertension and dyslipidemia, presented to the emergency room with acute onset of altered mental status with confusion, restlessness and occasional agitation as well as hypoxia at her SNF.     Goal of Therapy:  Electrolytes WNL  Plan:  --Phos 2.0 >1.8 after replacement yesterday. Kphos PO 2 packets every 4 hours x 2 doses ---recheck electrolytes in am  Elliot Gurney, PharmD, BCPS Clinical Pharmacist  03/26/2023 7:40 AM

## 2023-03-26 NOTE — Progress Notes (Addendum)
[facility] Progress Note  Patient: Julie Acosta    WUJ:[id number]  DOA: 03/19/2023     Date of Service: the patient was seen and examined on 03/26/2023  Chief Complaint  Patient presents with   Altered Mental Status   Brief hospital course: 89 year old with history of anxiety, depression, DM 2, GERD, HTN, HLD comes to the ED with altered mental status, confusion, hypoxia.  Upon admission had mild lactic acidosis, leukocytosis with concerns of sepsis secondary to pneumonia and urinary tract infection.  Started on Rocephin and azithromycin.  Over the course of 48 hours her mentation significantly improved.     Assessment & Plan:  Principal Problem:   Sepsis due to pneumonia [facility]) Active Problems:   Acute lower UTI   Acute metabolic encephalopathy   Hyperthyroidism   Type 2 diabetes mellitus with peripheral neuropathy (HCC)   Essential hypertension   Severe Sepsis without shock causing Metabolic Encephalopathy  Sepsis due to pneumonia and urinary tract infection Fever curve slightly better. COVID/Flu/Resp Panel-negative, ProCal 0.17.  Blood cultures NGTD S/p azithromycin x 4 doses S/p ceftriaxone 1 g IV daily for 5 days.  Completed course IS, flutter, nebs.    Acute metabolic encephalopathy; resolved Much more awake today. CTH is neg, TSH/Ammonia/B12/folate = normal.  11/18 delirium and visual hallucinations, insomnia 11/19 d/c'd Seroquel and trazodone 25 mg nightly prn for sleep. Changed due to excessive sleepiness. 11/21 today patient is awake and alert    Hypokalemia, potassium repleted. hypomagnesemia, mag repleted. Hypophosphatemia, phosphorus repleted. - Pharmacy pharmacy consulted to monitor and replete electrolytes   Volume overload Patient had elevated BNP and abnormal breath sounds Patient received IV fluid and IV electrolytes S/p IV Lasix 20 mg Currently patient is euvolemic, we will continue to monitor. 11/21 CXR Continued low lung volumes with  basilar hypoventilation especially on the left. No new cardiopulmonary abnormality. Lasix 40 mg one-time dose order placed   History of breast cancer status postlumpectomy 2019 History of sarcoma - Follows outpatient oncology   Factor V Leiden homozygous -Per oncology on lifelong Pradaxa 150 mg twice daily   Hematuria noticed on 11/18 Held Pradaxa for 3 doses US renal: No collecting system dilatation. Bilateral renal stones. Please correlate with the prior CT sca 11/21 held Pradaxa again today due to hematuria Hemoglobin is stable, we will continue to monitor   Essential hypertension - continue antihypertensive therapy.   Type 2 diabetes mellitus with peripheral neuropathy (HCC) Hold PO meds, ISS and accuchecks.  A1c 6.5   Hyperthyroidism TSH, Methomazole.    GERD PPI   PT/OT= wheelchair bound but does do basic movements   Body mass index is 27.73 kg/m.  Interventions:  Diet Orders (From admission, onward)     Start     Ordered   03/20/23 0833  Diet Carb Modified Fluid consistency: Thin; Room service appropriate? Yes  Diet effective now       Question Answer Comment  Diet-HS Snack? Nothing   Calorie Level Medium 1600-2000   Fluid consistency: Thin   Room service appropriate? Yes      03/20/23 0832            DVT Prophylaxis: Pradaxa, held due to hematuria  Advance goals of care discussion: DNR  Family Communication: family was not present at bedside, at the time of interview.  The pt provided permission to discuss medical plan with the family. Opportunity was given to ask question and all questions were answered satisfactorily.  11/19 discussed with patient's daughter over  the phone  Disposition:  Pt is from SNF, admitted with AMS due to PNA and UTI, still has AMS, which precludes a safe discharge. Discharge to SNF, when stable, most likely discharge in 1 to 2 days.  Subjective: No significant events overnight, patient is awake and alert, slightly  confused, does not remember what year is it.  Denied any complaints, no chest pain or palpitation no shortness of breath.  Patient is alert but sleepy, denied any complaints.   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 to cancer Neurologic: without any new focal findings Gait not checked due to wheel chair bound at baseline   Vitals:   03/25/23 1627 03/25/23 1951 03/26/23 0339 03/26/23 0823  BP: (!) 144/55 (!) 158/62 (!) 138/54 (!) 149/64  Pulse: 69 72 68 70  Resp: 17 16 (!) 24 17  Temp: 98.5 F (36.9 C) 98.7 F (37.1 C) 98.3 F (36.8 C) 98.1 F (36.7 C)  TempSrc:      SpO2: 97% 96% 97% 92%  Weight:      Height:        Intake/Output Summary (Last 24 hours) at 03/26/2023 1524 Last data filed at 03/26/2023 1610 Gross per 24 hour  Intake 250 ml  Output 100 ml  Net 150 ml   Filed Weights   03/20/23 0400 03/20/23 0447 03/20/23 1717  Weight: 72 kg 72 kg 71 kg    Data Reviewed: I have personally reviewed and interpreted daily labs, tele strips, imagings as discussed above. I reviewed all nursing notes, pharmacy notes, vitals, pertinent old records I have discussed plan of care as described above with RN and patient/family.  CBC: Recent Labs  Lab 03/19/23 1849 03/21/23 0502 03/22/23 0500 03/23/23 0507 03/24/23 0502 03/25/23 0512 03/26/23 0501  WBC 13.1*   < > 8.9 8.8 9.0 9.8 11.7*  NEUTROABS 9.8*  --   --   --   --   --   --   HGB 12.6   < > 11.3* 11.5* 10.8* 11.4* 11.1*  HCT 40.5   < > 35.4* 36.8 34.8* 37.1 35.8*  MCV  91.4   < > 91.2 92.5 93.3 93.5 92.5  PLT 244   < > 217 255 247 252 275   < > = values in this interval not displayed.   Basic Metabolic Panel: Recent Labs  Lab 03/21/23 0502 03/22/23 0500 03/23/23 0507 03/24/23 0502 03/25/23 0512 03/26/23 0501  NA 135 137 137 141 141 142  K 2.9* 3.7 3.5 3.6 3.9 3.7  CL 99 103 102 105 109 103  CO2 26 25 27 29 29 30   GLUCOSE 218* 175* 266* 158* 215* 187*  BUN 13 11 14 12 12 11   CREATININE 0.48 0.33* 0.53 0.50 0.41* 0.50  CALCIUM 8.6* 8.7* 9.1 9.6 8.8* 9.2  MG 1.1* 2.3 1.8 1.8 1.5* 2.0  PHOS 2.6  --   --  2.6 2.0* 1.8*    Studies: DG Chest Port 1 View  Result Date: 03/26/2023 CLINICAL DATA:  89 year old female with shortness of breath. EXAM: PORTABLE CHEST 1 VIEW COMPARISON:  Portable chest 03/21/2023 and earlier. FINDINGS: Portable AP semi upright view at 1058 hours. Continued low lung volumes. Stable cardiac size and mediastinal contours. Stable ventilation, left lung base hypo ventilation with enhancing atelectasis there on CTA 03/19/2023. No pneumothorax. Stable pulmonary vascularity, no overt edema. Paucity of bowel gas. Dextroconvex scoliosis. IMPRESSION: Continued low lung volumes with basilar hypoventilation especially on the left. No new cardiopulmonary abnormality. Electronically Signed   By: Odessa Fleming M.D.   On: 03/26/2023 12:02    Scheduled Meds:  cholecalciferol  1,000 Units Oral Daily   dabigatran  150 mg Oral Q12H   fluticasone  2 spray Each Nare BID   gabapentin  100 mg Oral TID   insulin aspart  0-15 Units Subcutaneous TID WC   insulin aspart  0-5 Units Subcutaneous QHS   loratadine  10 mg Oral Daily   melatonin  2.5 mg Oral QHS   methimazole  10 mg Oral Daily   metoprolol tartrate  50 mg Oral BID   midodrine  5 mg Oral TID WC   pantoprazole  40 mg Oral Daily   PARoxetine  30 mg Oral Daily   Continuous Infusions:   PRN Meds: acetaminophen, diclofenac Sodium, guaiFENesin, hydrALAZINE, ipratropium-albuterol, metoprolol  tartrate, ondansetron (ZOFRAN) IV, polyethylene glycol, tiZANidine, traZODone, ziprasidone  Time spent: 55 minutes  Author: Gillis Santa. MD [facility] 03/26/2023 3:24 PM  To reach On-call, see care teams to locate the attending and reach out to them via [email or web address]. If 7PM-7AM, please contact night-coverage If you still have difficulty reaching the attending provider, please page the [facility] (Director on Call) for [facility] on amion for assistance.

## 2023-03-26 NOTE — Progress Notes (Signed)
Patient's labs came back. Phosphorus 1.8 and WBC 11.7. notified Steward Drone NP via secure chat.

## 2023-03-26 NOTE — Inpatient Diabetes Management (Signed)
Inpatient Diabetes Program Recommendations  AACE/ADA: New Consensus Statement on Inpatient Glycemic Control (2015)  Target Ranges:  Prepandial:   less than 140 mg/dL      Peak postprandial:   less than 180 mg/dL (1-2 hours)      Critically ill patients:  140 - 180 mg/dL    Latest Reference Range & Units 03/25/23 08:11 03/25/23 11:57 03/25/23 16:29 03/25/23 21:13  Glucose-Capillary 70 - 99 mg/dL 782 (H)  3 units Novolog @0959  227 (H)  5 units Novolog  204 (H)  5 units Novolog  190 (H)  (H): Data is abnormally high  Latest Reference Range & Units 03/26/23 08:20  Glucose-Capillary 70 - 99 mg/dL 956 (H)  (H): Data is abnormally high    Home DM Meds: Glipizide 2.5 mg daily        Metformin 750 mg BID  Current Orders: Novolog Moderate Correction Scale/ SSI (0-15 units) TID AC + HS   MD- Note home oral DM meds are on hold.  AM CBGs have been elevated the last 2 days.    Please consider adding Semglee 7 units Daily (0.1 units/kg)    --Will follow patient during hospitalization--  Ambrose Finland RN, MSN, CDCES Diabetes Coordinator Inpatient Glycemic Control Team Team Pager: [phone] (8a-5p)

## 2023-03-27 DIAGNOSIS — A419 Sepsis, unspecified organism: Secondary | ICD-10-CM | POA: Diagnosis not present

## 2023-03-27 DIAGNOSIS — J189 Pneumonia, unspecified organism: Secondary | ICD-10-CM | POA: Diagnosis not present

## 2023-03-27 LAB — GLUCOSE, CAPILLARY
Glucose-Capillary: 185 mg/dL — ABNORMAL HIGH (ref 70–99)
Glucose-Capillary: 165 mg/dL — ABNORMAL HIGH (ref 70–99)
Glucose-Capillary: 255 mg/dL — ABNORMAL HIGH (ref 70–99)
Glucose-Capillary: 316 mg/dL — ABNORMAL HIGH (ref 70–99)

## 2023-03-27 LAB — PHOSPHORUS: Phosphorus: 2.1 mg/dL — ABNORMAL LOW (ref 2.5–4.6)

## 2023-03-27 LAB — BASIC METABOLIC PANEL
Anion gap: 8 (ref 5–15)
BUN: 12 mg/dL (ref 8–23)
CO2: 30 mmol/L (ref 22–32)
Calcium: 8.9 mg/dL (ref 8.9–10.3)
Chloride: 103 mmol/L (ref 98–111)
Creatinine, Ser: 0.48 mg/dL (ref 0.44–1.00)
GFR, Estimated: >60 mL/min (ref >60)
Glucose, Bld: 178 mg/dL — ABNORMAL HIGH (ref 70–99)
Potassium: 3.8 mmol/L (ref 3.5–5.1)
Sodium: 141 mmol/L (ref 135–145)

## 2023-03-27 LAB — CBC
HCT: 35.5 % — ABNORMAL LOW (ref 36.0–46.0)
Hemoglobin: 11.2 g/dL — ABNORMAL LOW (ref 12.0–15.0)
MCH: 28.7 pg (ref 26.0–34.0)
MCHC: 31.5 g/dL (ref 30.0–36.0)
MCV: 91 fL (ref 80.0–100.0)
Platelets: 272 10*3/uL (ref 150–400)
RBC: 3.9 MIL/uL (ref 3.87–5.11)
RDW: 15.5 % (ref 11.5–15.5)
WBC: 11.5 10*3/uL — ABNORMAL HIGH (ref 4.0–10.5)
nRBC: 0 % (ref 0.0–0.2)

## 2023-03-27 LAB — MAGNESIUM: Magnesium: 1.7 mg/dL (ref 1.7–2.4)

## 2023-03-27 MED ORDER — DABIGATRAN ETEXILATE MESYLATE 150 MG PO CAPS
150.0000 mg | ORAL_CAPSULE | Freq: Two times a day (BID) | ORAL | Status: DC
  Administered 2023-03-28 – 2023-03-30 (×5): 150 mg via ORAL
  Filled 2023-03-27 (×5): qty 1

## 2023-03-27 MED ORDER — POTASSIUM PHOSPHATES 15 MMOLE/5ML IV SOLN
30.0000 mmol | Freq: Once | INTRAVENOUS | Status: AC
  Administered 2023-03-27: 30 mmol via INTRAVENOUS
  Filled 2023-03-27: qty 10

## 2023-03-27 MED ORDER — POTASSIUM & SODIUM PHOSPHATES 280-160-250 MG PO PACK
2.0000 | PACK | Freq: Once | ORAL | Status: DC
  Filled 2023-03-27: qty 2

## 2023-03-27 NOTE — Progress Notes (Signed)
Occupational Therapy Treatment Patient Details Name: Julie Acosta MRN: [id number] DOB: 04/03/1934 Today's Date: 03/27/2023   History of present illness Pt is an 89 y/o F admitted on 03/19/23 after presenting with AMS, confusion, hypoxia. Pt is being treated for sepsis 2/2 PNA & UTI. 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 Pt agreeable, able to follow simple commands. Pt required MOD A for bed mobility and once EOB requires assist for static sitting balance ranging from SBA to CGA to MIN A with VC for R lateral shift to prevent L lateral LOB. Pt completed grooming and UB bathing tasks with set up EOB and only assist required for static sitting balance during tasks. Pt tolerated EOB activity for extended time period as compared to previous session. Pt required bed level assist for toileting, MAX A for pericare after small loose BM and urination with bed pan. Pt endorsing fatigue with exertion. VC intermittently for PLB. Pt continues to benefit from skilled OT Services.       If plan is discharge home, recommend the following:  Two people to help with walking and/or transfers;Two people to help with bathing/dressing/bathroom;Assistance with cooking/housework;Assist for transportation;Help with stairs or ramp for entrance;Supervision due to cognitive status;Direct supervision/assist for medications management;Direct supervision/assist for financial management   Equipment Recommendations  Other (comment) (defer to next venue)    Recommendations for Other Services      Precautions / Restrictions Precautions Precautions: Fall Precaution Comments: L hemipelvectomy & amputation Restrictions Weight Bearing Restrictions: No       Mobility Bed Mobility Overal bed mobility: Needs Assistance Bed Mobility: Supine to Sit,  Sit to Supine Rolling: Min assist, Used rails   Supine to sit: Mod assist, Used rails Sit to supine: Mod assist, Used rails        Transfers                   General transfer comment: Uses mechnical lift at baseline for transfers     Balance Overall balance assessment: Needs assistance Sitting-balance support: Feet unsupported, Feet supported, Single extremity supported Sitting balance-Leahy Scale: Poor Sitting balance - Comments: L lateral lean in sitting with occasional posterior lean                                   ADL either performed or assessed with clinical judgement   ADL Overall ADL's : Needs assistance/impaired     Grooming: Sitting;Set up;Supervision/safety;Contact guard assist;Oral care;Wash/dry face Grooming Details (indicate cue type and reason): CGA and PRN MIN A from PT for sitting balance, setup for grooming EOB no direct assist for tasks themselves, just balance Upper Body Bathing: Sitting;Set up;Contact guard assist Upper Body Bathing Details (indicate cue type and reason): pt able to wash chest, stomach, and part of her arms with setup, VC for use of bed rail to improve upright posture                 Toileting- Clothing Manipulation and Hygiene: Maximal assistance;Bed level Toileting - Clothing Manipulation Details (indicate cue type and reason): MAX A for pericare after using bed pan            Extremity/Trunk Assessment  Vision       Perception     Praxis      Cognition Arousal: Alert Behavior During Therapy: WFL for tasks assessed/performed Overall Cognitive Status: No family/caregiver present to determine baseline cognitive functioning                                 General Comments: Pt is alert and cooperative however does have some poor insight of her deficits/ poor awareness of current overall situation        Exercises      Shoulder Instructions       General  Comments Pt was able to perform hygiene task at EOB and ther ex EOB with CGA-min assist for keeping balance    Pertinent Vitals/ Pain       Pain Assessment Pain Assessment: PAINAD Breathing: occasional labored breathing, short period of hyperventilation Negative Vocalization: occasional moan/groan, low speech, negative/disapproving quality Facial Expression: smiling or inexpressive Body Language: relaxed Consolability: no need to console PAINAD Score: 2 Pain Location: R flank Pain Descriptors / Indicators: Discomfort Pain Intervention(s): Limited activity within patient's tolerance, Monitored during session, Repositioned  Home Living                                          Prior Functioning/Environment              Frequency  Min 1X/week        Progress Toward Goals  OT Goals(current goals can now be found in the care plan section)  Progress towards OT goals: Progressing toward goals  Acute Rehab OT Goals Patient Stated Goal: get better OT Goal Formulation: With patient Time For Goal Achievement: 04/05/23 Potential to Achieve Goals: Good  Plan      Co-evaluation    PT/OT/SLP Co-Evaluation/Treatment: Yes Reason for Co-Treatment: For patient/therapist safety;To address functional/ADL transfers PT goals addressed during session: Mobility/safety with mobility OT goals addressed during session: ADL's and self-care      AM-PAC OT "6 Clicks" Daily Activity     Outcome Measure   Help from another person eating meals?: None Help from another person taking care of personal grooming?: A Little Help from another person toileting, which includes using toliet, bedpan, or urinal?: A Lot Help from another person bathing (including washing, rinsing, drying)?: A Lot Help from another person to put on and taking off regular upper body clothing?: A Lot Help from another person to put on and taking off regular lower body clothing?: A Lot 6 Click Score:  15    End of Session Equipment Utilized During Treatment: Oxygen  OT Visit Diagnosis: Other abnormalities of gait and mobility (R26.89);Muscle weakness (generalized) (M62.81)   Activity Tolerance Patient tolerated treatment well   Patient Left in bed;with call bell/phone within reach;with bed alarm set   Nurse Communication          Time: 9528-4132 OT Time Calculation (min): 36 min  Charges: OT General Charges $OT Visit: 1 Visit OT Treatments $Self Care/Home Management : 8-22 mins  Arman Filter., MPH, MS, OTR/L ascom [phone] 03/27/23, 3:18 PM

## 2023-03-27 NOTE — Progress Notes (Addendum)
[facility] Progress Note  Patient: Julie Acosta    ZOX:[id number]  DOA: 03/19/2023     Date of Service: the patient was seen and examined on 03/27/2023  Chief Complaint  Patient presents with   Altered Mental Status   Brief hospital course: 89 year old with history of anxiety, depression, DM 2, GERD, HTN, HLD comes to the ED with altered mental status, confusion, hypoxia.  Upon admission had mild lactic acidosis, leukocytosis with concerns of sepsis secondary to pneumonia and urinary tract infection.  Started on Rocephin and azithromycin.  Over the course of 48 hours her mentation significantly improved.     Assessment & Plan:  Principal Problem:   Sepsis due to pneumonia [facility]) Active Problems:   Acute lower UTI   Acute metabolic encephalopathy   Hyperthyroidism   Type 2 diabetes mellitus with peripheral neuropathy (HCC)   Essential hypertension   # Severe Sepsis without shock causing Metabolic Encephalopathy  # Sepsis due to pneumonia and urinary tract infection Fever curve slightly better. COVID/Flu/Resp Panel-negative, ProCal 0.17.  Blood cultures NGTD S/p azithromycin x 4 doses S/p ceftriaxone 1 g IV daily for 5 days.  Completed course IS, flutter, nebs.    # Acute metabolic encephalopathy; resolved Much more awake today. CTH is neg, TSH/Ammonia/B12/folate = normal.  11/18 delirium and visual hallucinations, insomnia 11/19 d/c'd Seroquel and trazodone 25 mg nightly prn for sleep. Changed due to excessive sleepiness. 11/22 today patient is awake and alert    # Hypokalemia, potassium repleted.  Resolved # Hypomagnesemia, mag repleted.  Resolved # Hypophosphatemia, phosphorus repleted. - Pharmacy pharmacy consulted to monitor and replete electrolytes   # Volume overload Patient had elevated BNP and abnormal breath sounds Patient received IV fluid and IV electrolytes S/p IV Lasix 20 mg Currently patient is euvolemic, we will continue to monitor. 11/21 CXR  Continued low lung volumes with basilar hypoventilation especially on the left. No new cardiopulmonary abnormality. Lasix 40 mg one-time dose given   # History of breast cancer status postlumpectomy 2019 # History of sarcoma - Follows outpatient oncology   # Factor V Leiden homozygous -Per oncology on lifelong Pradaxa 150 mg twice daily   # Hematuria noticed on 11/18 Held Pradaxa for 3 doses US renal: No collecting system dilatation. Bilateral renal stones. Please correlate with the prior CT sca 11/21 held Pradaxa again today due to hematuria Hemoglobin is stable, we will continue to monitor   # Essential hypertension - continue antihypertensive therapy.   # Type 2 diabetes mellitus with peripheral neuropathy (HCC) Hold PO meds, ISS and accuchecks.  A1c 6.5   # Hyperthyroidism TSH, Methomazole.    # GERD PPI   PT/OT= wheelchair bound but does do basic movements   Body mass index is 27.73 kg/m.  Interventions:  Diet Orders (From admission, onward)     Start     Ordered   03/20/23 0833  Diet Carb Modified Fluid consistency: Thin; Room service appropriate? Yes  Diet effective now       Question Answer Comment  Diet-HS Snack? Nothing   Calorie Level Medium 1600-2000   Fluid consistency: Thin   Room service appropriate? Yes      03/20/23 0832            DVT Prophylaxis: Pradaxa, held due to hematuria  Advance goals of care discussion: DNR  Family Communication: family was not present at bedside, at the time of interview.  The pt provided permission to discuss medical plan with the family. Opportunity  was given to ask question and all questions were answered satisfactorily.  11/22 discussed with patient's daughter over the phone  Disposition:  Pt is from SNF, admitted with AMS due to PNA and UTI, still has AMS, which precludes a safe discharge. Discharge to SNF, when stable, most likely tomorrow a.m.  Subjective: No significant events overnight, patient  was awake and alert, pleasantly confused a little bit.  Denied any complaint, stated that she is feeling all right.   Physical Exam: General: NAD, lying comfortably Appear in no distress, affect appropriate Eyes: PERRLA ENT: Oral Mucosa Clear, moist  Neck: no JVD,  Cardiovascular: S1 and S2 Present, no Murmur,  Respiratory: Equal air entry bilaterally, mild bilateral crackles, no wheezing appreciated Abdomen: Bowel Sound present, Soft and no tenderness,  Skin: no rashes Extremities: RLE no Pedal edema and no calf tenderness, s/p LLE amputation due to left hemipelvis surgery since 1962 at age 86 due to cancer Neurologic: without any new focal findings Gait not checked due to wheel chair bound at baseline   Vitals:   03/26/23 1952 03/27/23 0444 03/27/23 0751 03/27/23 1002  BP: 132/61 (!) 148/59 (!) 154/60 (!) 154/60  Pulse: 69 73 69 69  Resp: 19 17 18    Temp: 98.3 F (36.8 C) 98.7 F (37.1 C) 98 F (36.7 C)   TempSrc: Oral Oral    SpO2: 96% 94% 91%   Weight:      Height:        Intake/Output Summary (Last 24 hours) at 03/27/2023 1341 Last data filed at 03/27/2023 1048 Gross per 24 hour  Intake 240 ml  Output --  Net 240 ml   Filed Weights   03/20/23 0400 03/20/23 0447 03/20/23 1717  Weight: 72 kg 72 kg 71 kg    Data Reviewed: I have personally reviewed and interpreted daily labs, tele strips, imagings as discussed above. I reviewed all nursing notes, pharmacy notes, vitals, pertinent old records I have discussed plan of care as described above with RN and patient/family.  CBC: Recent Labs  Lab 03/23/23 0507 03/24/23 0502 03/25/23 0512 03/26/23 0501 03/27/23 0545  WBC 8.8 9.0 9.8 11.7* 11.5*  HGB 11.5* 10.8* 11.4* 11.1* 11.2*  HCT 36.8 34.8* 37.1 35.8* 35.5*  MCV 92.5 93.3 93.5 92.5 91.0  PLT 255 247 252 275 272   Basic Metabolic Panel: Recent Labs  Lab 03/21/23 0502 03/22/23 0500 03/23/23 0507 03/24/23 0502 03/25/23 0512 03/26/23 0501  03/27/23 0545  NA 135   < > 137 141 141 142 141  K 2.9*   < > 3.5 3.6 3.9 3.7 3.8  CL 99   < > 102 105 109 103 103  CO2 26   < > 27 29 29 30 30   GLUCOSE 218*   < > 266* 158* 215* 187* 178*  BUN 13   < > 14 12 12 11 12   CREATININE 0.48   < > 0.53 0.50 0.41* 0.50 0.48  CALCIUM 8.6*   < > 9.1 9.6 8.8* 9.2 8.9  MG 1.1*   < > 1.8 1.8 1.5* 2.0 1.7  PHOS 2.6  --   --  2.6 2.0* 1.8* 2.1*   < > = values in this interval not displayed.    Studies: No results found.  Scheduled Meds:  cholecalciferol  1,000 Units Oral Daily   dabigatran  150 mg Oral Q12H   fluticasone  2 spray Each Nare BID   gabapentin  100 mg Oral TID   insulin aspart  0-15 Units Subcutaneous TID WC   insulin aspart  0-5 Units Subcutaneous QHS   loratadine  10 mg Oral Daily   melatonin  2.5 mg Oral QHS   methimazole  10 mg Oral Daily   metoprolol tartrate  50 mg Oral BID   pantoprazole  40 mg Oral Daily   PARoxetine  30 mg Oral Daily   Continuous Infusions:  potassium PHOSPHATE IVPB (in mmol) 30 mmol (03/27/23 1208)    PRN Meds: acetaminophen, diclofenac Sodium, guaiFENesin, hydrALAZINE, ipratropium-albuterol, metoprolol tartrate, midodrine, ondansetron (ZOFRAN) IV, polyethylene glycol, tiZANidine, traZODone, ziprasidone  Time spent: 40 minutes  Author: Gillis Santa. MD [facility] 03/27/2023 1:41 PM  To reach On-call, see care teams to locate the attending and reach out to them via [email or web address]. If 7PM-7AM, please contact night-coverage If you still have difficulty reaching the attending provider, please page the [facility] (Director on Call) for [facility] on amion for assistance.

## 2023-03-27 NOTE — Progress Notes (Signed)
Physical Therapy Treatment Patient Details Name: Julie Acosta MRN: [id number] DOB: Jan 07, 1934 Today's Date: 03/27/2023   History of Present Illness Pt is an 89 y/o F admitted on 03/19/23 after presenting with AMS, confusion, hypoxia. Pt is being treated for sepsis 2/2 PNA & UTI. 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 Pt was able to exit L side of bed with assistance. Sat EOB for ~ 20-25 minutes total while performing ther ex  and hygiene task. Pt does fatigue quickly but overall tolerated session well. Pt was on 4 L upon arrival but able to wean to baseline 2 L O2 during session with sao2 > 92%. She will greatly benefit from continued skilled PT to maximize her independence while assisting pt to PLOF.    If plan is discharge home, recommend the following: Two people to help with walking and/or transfers;Two people to help with bathing/dressing/bathroom;Direct supervision/assist for medications management     Equipment Recommendations  None recommended by PT       Precautions / Restrictions Precautions Precautions: Fall Precaution Comments: L hemipelvectomy & amputation Restrictions Weight Bearing Restrictions: No     Mobility  Bed Mobility Overal bed mobility: Needs Assistance Bed Mobility: Supine to Sit, Sit to Supine Rolling: Min assist, Used rails Supine to sit: Mod assist, Used rails Sit to supine: Mod assist, Used rails   Transfers  General transfer comment: Uses mechnical lift at baseline for transfers    Ambulation/Gait  General Gait Details: Non ambulatory at baseline   Balance Overall balance assessment: Needs assistance Sitting-balance support: Feet unsupported, Feet supported, Single extremity supported Sitting balance-Leahy Scale: Poor Sitting balance - Comments: L lateral lean  in sitting with occasional posterior lean       Cognition Arousal: Alert Behavior During Therapy: WFL for tasks assessed/performed Overall Cognitive Status: No family/caregiver present to determine baseline cognitive functioning    General Comments: Pt is alert and cooperative however does have some poor insight of her deficits/ poor awareness of current overall situation           General Comments General comments (skin integrity, edema, etc.): Pt was able to perform hygiene task at EOB and ther ex EOB with CGA-min assist for keeping balance      Pertinent Vitals/Pain Pain Assessment Pain Assessment: PAINAD Breathing: occasional labored breathing, short period of hyperventilation Negative Vocalization: occasional moan/groan, low speech, negative/disapproving quality Facial Expression: smiling or inexpressive Body Language: relaxed Consolability: no need to console PAINAD Score: 2 Pain Location: R flank Pain Descriptors / Indicators: Discomfort Pain Intervention(s): Limited activity within patient's tolerance, Monitored during session, Repositioned     PT Goals (current goals can now be found in the care plan section) Acute Rehab PT Goals Patient Stated Goal: none stated Progress towards PT goals: Progressing toward goals    Frequency    Min 1X/week           Co-evaluation     PT goals addressed during session: Mobility/safety with mobility        AM-PAC PT "6 Clicks" Mobility   Outcome Measure  Help needed turning from your back to your side while in a flat bed without using bedrails?: A Little Help needed moving from lying on your back to sitting on the side of a flat bed without using bedrails?: A Lot Help needed moving  to and from a bed to a chair (including a wheelchair)?: Total Help needed standing up from a chair using your arms (e.g., wheelchair or bedside chair)?: Total Help needed to walk in hospital room?: Total Help needed climbing 3-5 steps  with a railing? : Total 6 Click Score: 9    End of Session Equipment Utilized During Treatment: Oxygen (4 L weaned to 2 L post session) Activity Tolerance: Patient tolerated treatment well;Patient limited by fatigue Patient left: in bed;with call bell/phone within reach;with bed alarm set Nurse Communication: Mobility status PT Visit Diagnosis: Muscle weakness (generalized) (M62.81);Other abnormalities of gait and mobility (R26.89)     Time: 0865-7846 PT Time Calculation (min) (ACUTE ONLY): 36 min  Charges:    $Therapeutic Activity: 8-22 mins PT General Charges $$ ACUTE PT VISIT: 1 Visit                     Jetta Lout PTA 03/27/23, 12:39 PM

## 2023-03-27 NOTE — Plan of Care (Signed)
Pt alert and forgetful at times. Pt amputation of left total leg, no prosthesis here. Pt redness to coccyx with foam dressing applied. Possible discharge tomorrow back to [facility].  Problem: Fluid Volume: Goal: Hemodynamic stability will improve Outcome: Progressing   Problem: Clinical Measurements: Goal: Diagnostic test results will improve Outcome: Progressing Goal: Signs and symptoms of infection will decrease Outcome: Progressing   Problem: Respiratory: Goal: Ability to maintain adequate ventilation will improve Outcome: Progressing   Problem: Education: Goal: Ability to describe self-care measures that may prevent or decrease complications (Diabetes Survival Skills Education) will improve Outcome: Progressing Goal: Individualized Educational Video(s) Outcome: Progressing   Problem: Coping: Goal: Ability to adjust to condition or change in health will improve Outcome: Progressing   Problem: Fluid Volume: Goal: Ability to maintain a balanced intake and output will improve Outcome: Progressing   Problem: Health Behavior/Discharge Planning: Goal: Ability to identify and utilize available resources and services will improve Outcome: Progressing Goal: Ability to manage health-related needs will improve Outcome: Progressing   Problem: Metabolic: Goal: Ability to maintain appropriate glucose levels will improve Outcome: Progressing   Problem: Nutritional: Goal: Maintenance of adequate nutrition will improve Outcome: Progressing Goal: Progress toward achieving an optimal weight will improve Outcome: Progressing   Problem: Skin Integrity: Goal: Risk for impaired skin integrity will decrease Outcome: Progressing   Problem: Tissue Perfusion: Goal: Adequacy of tissue perfusion will improve Outcome: Progressing   Problem: Education: Goal: Knowledge of General Education information will improve Description: Including pain rating scale, medication(s)/side effects and  non-pharmacologic comfort measures Outcome: Progressing   Problem: Health Behavior/Discharge Planning: Goal: Ability to manage health-related needs will improve Outcome: Progressing   Problem: Clinical Measurements: Goal: Ability to maintain clinical measurements within normal limits will improve Outcome: Progressing Goal: Will remain free from infection Outcome: Progressing Goal: Diagnostic test results will improve Outcome: Progressing Goal: Respiratory complications will improve Outcome: Progressing Goal: Cardiovascular complication will be avoided Outcome: Progressing   Problem: Activity: Goal: Risk for activity intolerance will decrease Outcome: Progressing   Problem: Nutrition: Goal: Adequate nutrition will be maintained Outcome: Progressing   Problem: Coping: Goal: Level of anxiety will decrease Outcome: Progressing   Problem: Elimination: Goal: Will not experience complications related to bowel motility Outcome: Progressing Goal: Will not experience complications related to urinary retention Outcome: Progressing   Problem: Pain Management: Goal: General experience of comfort will improve Outcome: Progressing   Problem: Safety: Goal: Ability to remain free from injury will improve Outcome: Progressing   Problem: Skin Integrity: Goal: Risk for impaired skin integrity will decrease Outcome: Progressing

## 2023-03-27 NOTE — Progress Notes (Signed)
PHARMACY CONSULT NOTE - FOLLOW UP  Pharmacy Consult for Electrolyte Monitoring and Replacement   Recent Labs: Potassium (mmol/L)  Date Value  03/27/2023 3.8  07/01/2013 4.6   Magnesium (mg/dL)  Date Value  16/02/9603 1.7   Calcium (mg/dL)  Date Value  54/01/8118 8.9   Calcium, Total (PTH) (mg/dL)  Date Value  14/78/2956 11.9 (H)   Albumin (g/dL)  Date Value  21/30/8657 2.8 (L)  07/01/2013 3.6   Phosphorus (mg/dL)  Date Value  84/69/6295 2.1 (L)   Sodium  Date Value  03/27/2023 141 mmol/L  10/16/2022 139  07/01/2013 139 mmol/L     Assessment: 89 y.o. Caucasian female with medical history significant for anxiety, depression, type 2 diabetes mellitus, GERD, hypertension and dyslipidemia, presented to the emergency room with acute onset of altered mental status with confusion, restlessness and occasional agitation as well as hypoxia at her SNF.     Goal of Therapy:  Electrolytes WNL  Plan:  --Phos 1.8 > 2.1 after replacement yesterday. Kphos PO 2 packets x 1 dose ---recheck electrolytes in am  Elliot Gurney, PharmD, BCPS Clinical Pharmacist  03/27/2023 8:33 AM

## 2023-03-28 DIAGNOSIS — A419 Sepsis, unspecified organism: Secondary | ICD-10-CM | POA: Diagnosis not present

## 2023-03-28 DIAGNOSIS — J189 Pneumonia, unspecified organism: Secondary | ICD-10-CM | POA: Diagnosis not present

## 2023-03-28 LAB — BASIC METABOLIC PANEL
Anion gap: 8 (ref 5–15)
BUN: 11 mg/dL (ref 8–23)
CO2: 29 mmol/L (ref 22–32)
Calcium: 8.7 mg/dL — ABNORMAL LOW (ref 8.9–10.3)
Chloride: 101 mmol/L (ref 98–111)
Creatinine, Ser: 0.49 mg/dL (ref 0.44–1.00)
GFR, Estimated: >60 mL/min (ref >60)
Glucose, Bld: 177 mg/dL — ABNORMAL HIGH (ref 70–99)
Potassium: 4.2 mmol/L (ref 3.5–5.1)
Sodium: 138 mmol/L (ref 135–145)

## 2023-03-28 LAB — MAGNESIUM: Magnesium: 1.5 mg/dL — ABNORMAL LOW (ref 1.7–2.4)

## 2023-03-28 LAB — CBC
HCT: 34.1 % — ABNORMAL LOW (ref 36.0–46.0)
Hemoglobin: 10.6 g/dL — ABNORMAL LOW (ref 12.0–15.0)
MCH: 28.3 pg (ref 26.0–34.0)
MCHC: 31.1 g/dL (ref 30.0–36.0)
MCV: 90.9 fL (ref 80.0–100.0)
Platelets: 278 10*3/uL (ref 150–400)
RBC: 3.75 MIL/uL — ABNORMAL LOW (ref 3.87–5.11)
RDW: 15.4 % (ref 11.5–15.5)
WBC: 11 10*3/uL — ABNORMAL HIGH (ref 4.0–10.5)
nRBC: 0 % (ref 0.0–0.2)

## 2023-03-28 LAB — GLUCOSE, CAPILLARY
Glucose-Capillary: 156 mg/dL — ABNORMAL HIGH (ref 70–99)
Glucose-Capillary: 148 mg/dL — ABNORMAL HIGH (ref 70–99)
Glucose-Capillary: 218 mg/dL — ABNORMAL HIGH (ref 70–99)
Glucose-Capillary: 159 mg/dL — ABNORMAL HIGH (ref 70–99)

## 2023-03-28 LAB — PHOSPHORUS: Phosphorus: 2.5 mg/dL (ref 2.5–4.6)

## 2023-03-28 MED ORDER — MAGNESIUM OXIDE 400 MG PO CAPS: 400.0000 mg | ORAL_CAPSULE | Freq: Two times a day (BID) | ORAL | Status: AC

## 2023-03-28 MED ORDER — IPRATROPIUM-ALBUTEROL 0.5-2.5 (3) MG/3ML IN SOLN: 3.0000 mL | RESPIRATORY_TRACT | Status: AC | PRN

## 2023-03-28 MED ORDER — MAGNESIUM SULFATE 2 GM/50ML IV SOLN
2.0000 g | Freq: Once | INTRAVENOUS | Status: AC
  Administered 2023-03-28: 2 g via INTRAVENOUS
  Filled 2023-03-28: qty 50

## 2023-03-28 NOTE — Plan of Care (Signed)

## 2023-03-28 NOTE — Progress Notes (Signed)
PHARMACY CONSULT NOTE - FOLLOW UP  Pharmacy Consult for Electrolyte Monitoring and Replacement   Recent Labs: Potassium (mmol/L)  Date Value  03/28/2023 4.2  07/01/2013 4.6   Magnesium (mg/dL)  Date Value  57/84/6962 1.5 (L)   Calcium (mg/dL)  Date Value  95/28/4132 8.7 (L)   Calcium, Total (PTH) (mg/dL)  Date Value  44/05/270 11.9 (H)   Albumin (g/dL)  Date Value  53/66/4403 2.8 (L)  07/01/2013 3.6   Phosphorus (mg/dL)  Date Value  47/42/5956 2.5   Sodium  Date Value  03/28/2023 138 mmol/L  10/16/2022 139  07/01/2013 139 mmol/L     Assessment: 89 y.o. Caucasian female with medical history significant for anxiety, depression, type 2 diabetes mellitus, GERD, hypertension and dyslipidemia, presented to the emergency room with acute onset of altered mental status with confusion, restlessness and occasional agitation as well as hypoxia at her SNF.     Goal of Therapy:  Electrolytes WNL  Plan:  --Mag 1.5: Mag sulfate IV 2g x 1 --recheck electrolytes in am  Bettey Costa, PharmD Clinical Pharmacist 03/28/2023 8:22 AM

## 2023-03-28 NOTE — Progress Notes (Signed)
[facility] Progress Note  Patient: Julie Acosta    NWG:[id number]  DOA: 03/19/2023     Date of Service: the patient was seen and examined on 03/28/2023  Chief Complaint  Patient presents with   Altered Mental Status   Brief hospital course: 89 year old with history of anxiety, depression, DM 2, GERD, HTN, HLD comes to the ED with altered mental status, confusion, hypoxia.  Upon admission had mild lactic acidosis, leukocytosis with concerns of sepsis secondary to pneumonia and urinary tract infection.  Started on Rocephin and azithromycin.  Over the course of 48 hours her mentation significantly improved.     Assessment & Plan:  Principal Problem:   Sepsis due to pneumonia [facility] Active Problems:   Acute lower UTI   Acute metabolic encephalopathy   Hyperthyroidism   Type 2 diabetes mellitus with peripheral neuropathy (HCC)   Essential hypertension   # Severe Sepsis without shock causing Metabolic Encephalopathy  # Sepsis due to pneumonia and urinary tract infection Fever curve slightly better. COVID/Flu/Resp Panel-negative, ProCal 0.17.  Blood cultures NGTD S/p azithromycin x 4 doses S/p ceftriaxone 1 g IV daily for 5 days.  Completed course IS, flutter, nebs.    # Acute metabolic encephalopathy; resolved Much more awake today. CTH is neg, TSH/Ammonia/B12/folate = normal.  11/18 delirium and visual hallucinations, insomnia 11/19 d/c'd Seroquel and trazodone 25 mg nightly prn for sleep. Changed due to excessive sleepiness. 11/22 today patient is awake and alert    # Hypokalemia, potassium repleted.  Resolved # Hypomagnesemia, mag repleted.   # Hypophosphatemia, phosphorus repleted. - Pharmacy pharmacy consulted to monitor and replete electrolytes   # Volume overload Patient had elevated BNP and abnormal breath sounds Patient received IV fluid and IV electrolytes S/p IV Lasix 20 mg Currently patient is euvolemic, we will continue to monitor. 11/21 CXR Continued  low lung volumes with basilar hypoventilation especially on the left. No new cardiopulmonary abnormality. Lasix 40 mg one-time dose given   # History of breast cancer status postlumpectomy 2019 # History of sarcoma - Follows outpatient oncology   # Factor V Leiden homozygous -Per oncology on lifelong Pradaxa 150 mg twice daily   # Hematuria noticed on 11/18, resolved on 11/23 Held Pradaxa for 3 doses US renal: No collecting system dilatation. Bilateral renal stones. Please correlate with the prior CT sca 11/21 held Pradaxa again today due to hematuria 11/23 no hematuria, resumed Pradaxa today Hemoglobin is stable, we will continue to monitor   # Essential hypertension - continue antihypertensive therapy.   # Type 2 diabetes mellitus with peripheral neuropathy (HCC) Hold PO meds, ISS and accuchecks.  A1c 6.5   # Hyperthyroidism TSH, Methomazole.    # GERD PPI   PT/OT= wheelchair bound but does do basic movements   Body mass index is 27.73 kg/m.  Interventions:  Diet Orders (From admission, onward)     Start     Ordered   03/28/23 0000  Diet - low sodium heart healthy        03/28/23 1215   03/28/23 0000  Diet Carb Modified        03/28/23 1215   03/20/23 0833  Diet Carb Modified Fluid consistency: Thin; Room service appropriate? Yes  Diet effective now       Question Answer Comment  Diet-HS Snack? Nothing   Calorie Level Medium 1600-2000   Fluid consistency: Thin   Room service appropriate? Yes      03/20/23 5784  DVT Prophylaxis: Pradaxa, held due to hematuria  Advance goals of care discussion: DNR  Family Communication: family was not present at bedside, at the time of interview.  The pt provided permission to discuss medical plan with the family. Opportunity was given to ask question and all questions were answered satisfactorily.  11/22 discussed with patient's daughter over the phone  Disposition:  Pt is from SNF, admitted with AMS  due to PNA and UTI, hematuria, electrolyte imbalance.  Clinically stable, medically optimized to discharge. Discharge to SNF, awaiting for insurance Auth as per TOC  Subjective: No significant events overnight, patient was feeling fine, stated that she would like to go back to SNF.  Denied any complaints.  Resting comfortably.  No hematuria observed as per RN.  Patient is voiding well, has residual urine, PVR around 120  Physical Exam: General: NAD, lying comfortably Appear in no distress, affect appropriate Eyes: PERRLA ENT: Oral Mucosa Clear, moist  Neck: no JVD,  Cardiovascular: S1 and S2 Present, no Murmur,  Respiratory: Equal air entry bilaterally, mild bilateral crackles, no wheezing appreciated Abdomen: Bowel Sound present, Soft and no tenderness,  Skin: no rashes Extremities: RLE no Pedal edema and no calf tenderness, s/p LLE amputation due to left hemipelvis surgery since 1962 at age 72 due to cancer Neurologic: without any new focal findings Gait not checked due to wheel chair bound at baseline   Vitals:   03/27/23 1002 03/27/23 1552 03/27/23 2025 03/28/23 0726  BP: (!) 154/60 (!) 117/56 (!) 129/59 (!) 148/54  Pulse: 69 75 69 67  Resp:  18    Temp:  98.1 F (36.7 C) 98 F (36.7 C) 97.8 F (36.6 C)  TempSrc:      SpO2:  92% 94% 94%  Weight:      Height:        Intake/Output Summary (Last 24 hours) at 03/28/2023 1246 Last data filed at 03/27/2023 1925 Gross per 24 hour  Intake 331.45 ml  Output --  Net 331.45 ml   Filed Weights   03/20/23 0400 03/20/23 0447 03/20/23 1717  Weight: 72 kg 72 kg 71 kg    Data Reviewed: I have personally reviewed and interpreted daily labs, tele strips, imagings as discussed above. I reviewed all nursing notes, pharmacy notes, vitals, pertinent old records I have discussed plan of care as described above with RN and patient/family.  CBC: Recent Labs  Lab 03/24/23 0502 03/25/23 0512 03/26/23 0501 03/27/23 0545  03/28/23 0426  WBC 9.0 9.8 11.7* 11.5* 11.0*  HGB 10.8* 11.4* 11.1* 11.2* 10.6*  HCT 34.8* 37.1 35.8* 35.5* 34.1*  MCV 93.3 93.5 92.5 91.0 90.9  PLT 247 252 275 272 278   Basic Metabolic Panel: Recent Labs  Lab 03/24/23 0502 03/25/23 0512 03/26/23 0501 03/27/23 0545 03/28/23 0426  NA 141 141 142 141 138  K 3.6 3.9 3.7 3.8 4.2  CL 105 109 103 103 101  CO2 29 29 30 30 29   GLUCOSE 158* 215* 187* 178* 177*  BUN 12 12 11 12 11   CREATININE 0.50 0.41* 0.50 0.48 0.49  CALCIUM 9.6 8.8* 9.2 8.9 8.7*  MG 1.8 1.5* 2.0 1.7 1.5*  PHOS 2.6 2.0* 1.8* 2.1* 2.5    Studies: No results found.  Scheduled Meds:  cholecalciferol  1,000 Units Oral Daily   dabigatran  150 mg Oral Q12H   fluticasone  2 spray Each Nare BID   gabapentin  100 mg Oral TID   insulin aspart  0-15 Units Subcutaneous TID  WC   insulin aspart  0-5 Units Subcutaneous QHS   loratadine  10 mg Oral Daily   melatonin  2.5 mg Oral QHS   methimazole  10 mg Oral Daily   metoprolol tartrate  50 mg Oral BID   pantoprazole  40 mg Oral Daily   PARoxetine  30 mg Oral Daily   Continuous Infusions:    PRN Meds: acetaminophen, diclofenac Sodium, guaiFENesin, hydrALAZINE, ipratropium-albuterol, metoprolol tartrate, midodrine, ondansetron (ZOFRAN) IV, polyethylene glycol, tiZANidine, traZODone, ziprasidone  Time spent: 40 minutes  Author: Gillis Santa. MD [facility] 03/28/2023 12:46 PM  To reach On-call, see care teams to locate the attending and reach out to them via [email or web address]. If 7PM-7AM, please contact night-coverage If you still have difficulty reaching the attending provider, please page the [facility] (Director on Call) for [facility] on amion for assistance.

## 2023-03-28 NOTE — TOC Progression Note (Signed)
Transition of Care [facility]) - Progression Note    Patient Details  Name: DESHELIA PIVIROTTO MRN: [id number] Date of Birth: 1934-01-22  [facility] CM/SW Contact  Rodney Langton, RN Phone Number: 03/28/2023, 1:59 PM  Clinical Narrative:     Patient ready for discharge back to SNF, however does not have auth.  Process started and pending, auth ID [id number].  Expected Discharge Plan: Skilled Nursing Facility Barriers to Discharge: Continued Medical Work up  Expected Discharge Plan and Services         Expected Discharge Date: 03/28/23                                     Social Determinants of Health (SDOH) Interventions SDOH Screenings   Food Insecurity: No Food Insecurity (03/20/2023)  Housing: Low Risk  (03/20/2023)  Transportation Needs: No Transportation Needs (03/20/2023)  Utilities: Not At Risk (03/20/2023)  Financial Resource Strain: Low Risk  (02/16/2023)   Received from [facility]  Tobacco Use: Low Risk  (03/20/2023)    Readmission Risk Interventions     No data to display

## 2023-03-29 DIAGNOSIS — A419 Sepsis, unspecified organism: Secondary | ICD-10-CM | POA: Diagnosis not present

## 2023-03-29 DIAGNOSIS — J189 Pneumonia, unspecified organism: Secondary | ICD-10-CM | POA: Diagnosis not present

## 2023-03-29 LAB — PHOSPHORUS: Phosphorus: 1.9 mg/dL — ABNORMAL LOW (ref 2.5–4.6)

## 2023-03-29 LAB — BASIC METABOLIC PANEL
Anion gap: 8 (ref 5–15)
BUN: 10 mg/dL (ref 8–23)
CO2: 27 mmol/L (ref 22–32)
Calcium: 9.1 mg/dL (ref 8.9–10.3)
Chloride: 103 mmol/L (ref 98–111)
Creatinine, Ser: 0.5 mg/dL (ref 0.44–1.00)
GFR, Estimated: >60 mL/min (ref >60)
Glucose, Bld: 173 mg/dL — ABNORMAL HIGH (ref 70–99)
Potassium: 4.4 mmol/L (ref 3.5–5.1)
Sodium: 138 mmol/L (ref 135–145)

## 2023-03-29 LAB — CBC
HCT: 35.1 % — ABNORMAL LOW (ref 36.0–46.0)
Hemoglobin: 10.9 g/dL — ABNORMAL LOW (ref 12.0–15.0)
MCH: 28.8 pg (ref 26.0–34.0)
MCHC: 31.1 g/dL (ref 30.0–36.0)
MCV: 92.9 fL (ref 80.0–100.0)
Platelets: 312 10*3/uL (ref 150–400)
RBC: 3.78 MIL/uL — ABNORMAL LOW (ref 3.87–5.11)
RDW: 15.2 % (ref 11.5–15.5)
WBC: 10.1 10*3/uL (ref 4.0–10.5)
nRBC: 0 % (ref 0.0–0.2)

## 2023-03-29 LAB — GLUCOSE, CAPILLARY
Glucose-Capillary: 144 mg/dL — ABNORMAL HIGH (ref 70–99)
Glucose-Capillary: 159 mg/dL — ABNORMAL HIGH (ref 70–99)
Glucose-Capillary: 164 mg/dL — ABNORMAL HIGH (ref 70–99)
Glucose-Capillary: 174 mg/dL — ABNORMAL HIGH (ref 70–99)
Glucose-Capillary: 194 mg/dL — ABNORMAL HIGH (ref 70–99)
Glucose-Capillary: 202 mg/dL — ABNORMAL HIGH (ref 70–99)

## 2023-03-29 LAB — MAGNESIUM: Magnesium: 1.9 mg/dL (ref 1.7–2.4)

## 2023-03-29 MED ORDER — POTASSIUM & SODIUM PHOSPHATES 280-160-250 MG PO PACK
2.0000 | PACK | Freq: Three times a day (TID) | ORAL | Status: AC
  Administered 2023-03-29 (×3): 2 via ORAL
  Filled 2023-03-29 (×3): qty 2

## 2023-03-29 NOTE — Progress Notes (Signed)
[facility] Progress Note  Patient: Julie Acosta    ONG:[id number]  DOA: 03/19/2023     Date of Service: the patient was seen and examined on 03/29/2023  Chief Complaint  Patient presents with   Altered Mental Status   Brief hospital course: 89 year old with history of anxiety, depression, DM 2, GERD, HTN, HLD comes to the ED with altered mental status, confusion, hypoxia.  Upon admission had mild lactic acidosis, leukocytosis with concerns of sepsis secondary to pneumonia and urinary tract infection.  Started on Rocephin and azithromycin.  Over the course of 48 hours her mentation significantly improved.     Assessment & Plan:  Principal Problem:   Sepsis due to pneumonia [facility]) Active Problems:   Acute lower UTI   Acute metabolic encephalopathy   Hyperthyroidism   Type 2 diabetes mellitus with peripheral neuropathy (HCC)   Essential hypertension   # Severe Sepsis without shock causing Metabolic Encephalopathy  # Sepsis due to pneumonia and urinary tract infection Fever curve slightly better. COVID/Flu/Resp Panel-negative, ProCal 0.17.  Blood cultures NGTD S/p azithromycin x 4 doses S/p ceftriaxone 1 g IV daily for 5 days.  Completed course IS, flutter, nebs.    # Acute metabolic encephalopathy; resolved Much more awake today. CTH is neg, TSH/Ammonia/B12/folate = normal.  11/18 delirium and visual hallucinations, insomnia 11/19 d/c'd Seroquel and trazodone 25 mg nightly prn for sleep. Changed due to excessive sleepiness. 11/22 today patient is awake and alert    # Hypokalemia, potassium repleted.  Resolved # Hypomagnesemia, mag repleted.   # Hypophosphatemia, phosphorus repleted. - Pharmacy pharmacy consulted to monitor and replete electrolytes   # Volume overload Patient had elevated BNP and abnormal breath sounds Patient received IV fluid and IV electrolytes S/p IV Lasix 20 mg Currently patient is euvolemic, we will continue to monitor. 11/21 CXR Continued  low lung volumes with basilar hypoventilation especially on the left. No new cardiopulmonary abnormality. Lasix 40 mg one-time dose given   # History of breast cancer status postlumpectomy 2019 # History of sarcoma - Follows outpatient oncology   # Factor V Leiden homozygous -Per oncology on lifelong Pradaxa 150 mg twice daily   # Hematuria noticed on 11/18, resolved on 11/23 Held Pradaxa for 3 doses US renal: No collecting system dilatation. Bilateral renal stones. Please correlate with the prior CT sca 11/21 held Pradaxa again today due to hematuria 11/23 no hematuria, resumed Pradaxa today Hemoglobin is stable, we will continue to monitor   # Essential hypertension - continue antihypertensive therapy.   # Type 2 diabetes mellitus with peripheral neuropathy (HCC) Hold PO meds, ISS and accuchecks.  A1c 6.5   # Hyperthyroidism TSH, Methomazole.    # GERD PPI   PT/OT= wheelchair bound but does do basic movements   Body mass index is 27.73 kg/m.  Interventions:  Diet Orders (From admission, onward)     Start     Ordered   03/28/23 0000  Diet - low sodium heart healthy        03/28/23 1215   03/28/23 0000  Diet Carb Modified        03/28/23 1215   03/20/23 0833  Diet Carb Modified Fluid consistency: Thin; Room service appropriate? Yes  Diet effective now       Question Answer Comment  Diet-HS Snack? Nothing   Calorie Level Medium 1600-2000   Fluid consistency: Thin   Room service appropriate? Yes      03/20/23 4401  DVT Prophylaxis: Pradaxa, held due to hematuria  Advance goals of care discussion: DNR  Family Communication: family was not present at bedside, at the time of interview.  The pt provided permission to discuss medical plan with the family. Opportunity was given to ask question and all questions were answered satisfactorily.  11/22 discussed with patient's daughter over the phone  Disposition:  Pt is from SNF, admitted with AMS  due to PNA and UTI, hematuria, electrolyte imbalance.  Clinically stable, medically optimized to discharge. Discharge to SNF, awaiting for insurance Auth as per TOC  Subjective: No significant events overnight, patient stated that she is feeling fine.  Resting comfortably, denied any worsening of shortness of breath, no chest pain or palpitations.  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 at age 89 due to cancer Neurologic: without any new focal findings Gait not checked due to wheel chair bound at baseline   Vitals:   03/28/23 2010 03/29/23 0511 03/29/23 0817 03/29/23 0818  BP: (!) 137/47 (!) 148/47  (!) 150/53  Pulse: (!) 59 74 79 76  Resp: 18 18 20 20   Temp: 98 F (36.7 C) 98.2 F (36.8 C) 98.3 F (36.8 C) 98.8 F (37.1 C)  TempSrc:      SpO2: 96% 94% 93% 93%  Weight:      Height:       No intake or output data in the 24 hours ending 03/29/23 1150  Filed Weights   03/20/23 0400 03/20/23 0447 03/20/23 1717  Weight: 72 kg 72 kg 71 kg    Data Reviewed: I have personally reviewed and interpreted daily labs, tele strips, imagings as discussed above. I reviewed all nursing notes, pharmacy notes, vitals, pertinent old records I have discussed plan of care as described above with RN and patient/family.  CBC: Recent Labs  Lab 03/25/23 0512 03/26/23 0501 03/27/23 0545 03/28/23 0426 03/29/23 0414  WBC 9.8 11.7* 11.5* 11.0* 10.1  HGB 11.4* 11.1* 11.2* 10.6* 10.9*  HCT 37.1 35.8* 35.5* 34.1* 35.1*  MCV 93.5 92.5 91.0  90.9 92.9  PLT 252 275 272 278 312   Basic Metabolic Panel: Recent Labs  Lab 03/25/23 0512 03/26/23 0501 03/27/23 0545 03/28/23 0426 03/29/23 0414  NA 141 142 141 138 138  K 3.9 3.7 3.8 4.2 4.4  CL 109 103 103 101 103  CO2 29 30 30 29 27   GLUCOSE 215* 187* 178* 177* 173*  BUN 12 11 12 11 10   CREATININE 0.41* 0.50 0.48 0.49 0.50  CALCIUM 8.8* 9.2 8.9 8.7* 9.1  MG 1.5* 2.0 1.7 1.5* 1.9  PHOS 2.0* 1.8* 2.1* 2.5 1.9*    Studies: No results found.  Scheduled Meds:  cholecalciferol  1,000 Units Oral Daily   dabigatran  150 mg Oral Q12H   fluticasone  2 spray Each Nare BID   gabapentin  100 mg Oral TID   insulin aspart  0-15 Units Subcutaneous TID WC   insulin aspart  0-5 Units Subcutaneous QHS   loratadine  10 mg Oral Daily   melatonin  2.5 mg Oral QHS  methimazole  10 mg Oral Daily   metoprolol tartrate  50 mg Oral BID   pantoprazole  40 mg Oral Daily   PARoxetine  30 mg Oral Daily   potassium & sodium phosphates  2 packet Oral TID AC & HS   Continuous Infusions:    PRN Meds: acetaminophen, diclofenac Sodium, guaiFENesin, hydrALAZINE, ipratropium-albuterol, metoprolol tartrate, midodrine, ondansetron (ZOFRAN) IV, polyethylene glycol, tiZANidine, traZODone, ziprasidone  Time spent: 40 minutes  Author: Gillis Santa. MD [facility] 03/29/2023 11:50 AM  To reach On-call, see care teams to locate the attending and reach out to them via [email or web address]. If 7PM-7AM, please contact night-coverage If you still have difficulty reaching the attending provider, please page the [facility] (Director on Call) for [facility] on amion for assistance.

## 2023-03-29 NOTE — TOC Progression Note (Signed)
Transition of Care [facility]) - Progression Note    Patient Details  Name: Julie Acosta MRN: [id number] Date of Birth: [birth year]  Transition [facility]) CM/SW Contact  Rodney Langton, RN Phone Number: 03/29/2023, 1:27 PM  Clinical Narrative:     [facility] remains pending  Expected Discharge Plan: Skilled Nursing Facility Barriers to Discharge: Continued Medical Work up  Expected Discharge Plan and Services         Expected Discharge Date: 03/28/23                                     Social Determinants of Health (SDOH) Interventions SDOH Screenings   Food Insecurity: No Food Insecurity (03/20/2023)  Housing: Low Risk  (03/20/2023)  Transportation Needs: No Transportation Needs (03/20/2023)  Utilities: Not At Risk (03/20/2023)  Financial Resource Strain: Low Risk  (02/16/2023)   Received from [facility]  Tobacco Use: Low Risk  (03/20/2023)    Readmission Risk Interventions     No data to display

## 2023-03-29 NOTE — Progress Notes (Signed)
PHARMACY CONSULT NOTE - FOLLOW UP  Pharmacy Consult for Electrolyte Monitoring and Replacement   Recent Labs: Potassium (mmol/L)  Date Value  03/29/2023 4.4  07/01/2013 4.6   Magnesium (mg/dL)  Date Value  29/52/8413 1.9   Calcium (mg/dL)  Date Value  24/40/1027 9.1   Calcium, Total (PTH) (mg/dL)  Date Value  25/36/6440 11.9 (H)   Albumin (g/dL)  Date Value  34/74/2595 2.8 (L)  07/01/2013 3.6   Phosphorus (mg/dL)  Date Value  63/87/5643 1.9 (L)   Sodium  Date Value  03/29/2023 138 mmol/L  10/16/2022 139  07/01/2013 139 mmol/L     Assessment: 89 y.o. Caucasian female with medical history significant for anxiety, depression, type 2 diabetes mellitus, GERD, hypertension and dyslipidemia, presented to the emergency room with acute onset of altered mental status with confusion, restlessness and occasional agitation as well as hypoxia at her SNF.     Goal of Therapy:  Electrolytes WNL  Plan:  --no replacement indicated --recheck electrolytes in am  Bettey Costa, PharmD Clinical Pharmacist 03/29/2023 7:38 AM

## 2023-03-30 ENCOUNTER — Telehealth: Payer: Medicare Other | Admitting: Student

## 2023-03-30 DIAGNOSIS — J189 Pneumonia, unspecified organism: Secondary | ICD-10-CM | POA: Diagnosis not present

## 2023-03-30 DIAGNOSIS — A419 Sepsis, unspecified organism: Secondary | ICD-10-CM | POA: Diagnosis not present

## 2023-03-30 LAB — PHOSPHORUS: Phosphorus: 2.4 mg/dL — ABNORMAL LOW (ref 2.5–4.6)

## 2023-03-30 LAB — GLUCOSE, CAPILLARY
Glucose-Capillary: 173 mg/dL — ABNORMAL HIGH (ref 70–99)
Glucose-Capillary: 205 mg/dL — ABNORMAL HIGH (ref 70–99)
Glucose-Capillary: 238 mg/dL — ABNORMAL HIGH (ref 70–99)

## 2023-03-30 LAB — BASIC METABOLIC PANEL
Anion gap: 9 (ref 5–15)
BUN: 8 mg/dL (ref 8–23)
CO2: 25 mmol/L (ref 22–32)
Calcium: 9.3 mg/dL (ref 8.9–10.3)
Chloride: 103 mmol/L (ref 98–111)
Creatinine, Ser: 0.62 mg/dL (ref 0.44–1.00)
GFR, Estimated: >60 mL/min (ref >60)
Glucose, Bld: 202 mg/dL — ABNORMAL HIGH (ref 70–99)
Potassium: 5.5 mmol/L — ABNORMAL HIGH (ref 3.5–5.1)
Sodium: 137 mmol/L (ref 135–145)

## 2023-03-30 LAB — MAGNESIUM: Magnesium: 1.7 mg/dL (ref 1.7–2.4)

## 2023-03-30 MED ORDER — SACCHAROMYCES BOULARDII 250 MG PO CAPS
250.0000 mg | ORAL_CAPSULE | Freq: Two times a day (BID) | ORAL | Status: DC
  Administered 2023-03-30: 250 mg via ORAL
  Filled 2023-03-30: qty 1

## 2023-03-30 NOTE — Telephone Encounter (Signed)
Verification of orders for readmission to facility. No controlled substances needed.

## 2023-03-30 NOTE — Progress Notes (Signed)
Patient admitted for AMS, Sepsis d/t PNA, ARF with Hypoxia. During my initial assessment she was oriented to place and person and disoriented to year but knows the month. I received in report that she is AMS oriented x2-4 she is increasingly more confused now. She is able to be reoriented.   She just recently called out saying she is at [facility] and needs to get back to [facility] and is unsure why she is here and needs 911 to be called. This RN went into the room and reoriented her. After reorienting this patient, she knows it is November, knows who she is and the place.   RN notified Larkin Ina, NP of the above information.

## 2023-03-30 NOTE — TOC Progression Note (Signed)
Transition of Care [facility]) - Progression Note    Patient Details  Name: ANTONIETA PARAMO MRN: [id number] Date of Birth: 1933/07/18  Transition of Care [facility]) CM/SW Contact  Allena Katz, LCSW Phone Number: 03/30/2023, 10:37 AM  Clinical Narrative:   [facility] still pending for rehab.    Expected Discharge Plan: Skilled Nursing Facility Barriers to Discharge: Continued Medical Work up  Expected Discharge Plan and Services         Expected Discharge Date: 03/28/23                                     Social Determinants of Health (SDOH) Interventions SDOH Screenings   Food Insecurity: No Food Insecurity (03/20/2023)  Housing: Low Risk  (03/20/2023)  Transportation Needs: No Transportation Needs (03/20/2023)  Utilities: Not At Risk (03/20/2023)  Financial Resource Strain: Low Risk  (02/16/2023)   Received from [facility]  Tobacco Use: Low Risk  (03/20/2023)    Readmission Risk Interventions     No data to display

## 2023-03-30 NOTE — TOC Transition Note (Addendum)
Transition of Care [facility]) - CM/SW Discharge Note   Patient Details  Name: Julie Acosta MRN: [id number] Date of Birth: 11/04/1933  Transition of Care [facility]) CM/SW Contact:  Allena Katz, LCSW Phone Number: 03/30/2023, 2:40 PM   Clinical Narrative:   Daughter reports she does not want to do the P2P and would like for patient to return to [facility]. Sue Lush notified. RN given number for report.     Final next level of care: Skilled Nursing Facility Barriers to Discharge: Barriers Resolved   Patient Goals and CMS Choice CMS Medicare.gov Compare Post Acute Care list provided to:: Patient Represenative (must comment) (daughter)    Discharge Placement                Patient chooses bed at: [facility] Patient to be transferred to facility by: [facility] Name of family member notified: daughter Patient and family notified of of transfer: 03/30/23  Discharge Plan and Services Additional resources added to the After Visit Summary for                                       Social Determinants of Health (SDOH) Interventions SDOH Screenings   Food Insecurity: No Food Insecurity (03/20/2023)  Housing: Low Risk  (03/20/2023)  Transportation Needs: No Transportation Needs (03/20/2023)  Utilities: Not At Risk (03/20/2023)  Financial Resource Strain: Low Risk  (02/16/2023)   Received from [facility]  Tobacco Use: Low Risk  (03/20/2023)     Readmission Risk Interventions     No data to display

## 2023-03-30 NOTE — NC FL2 (Signed)
[place] MEDICAID FL2 LEVEL OF CARE FORM     IDENTIFICATION  Patient Name: Julie Acosta Birthdate: 19-Mar-1934 Sex: female Admission Date (Current Location): 03/19/2023  [place] Number:  Chiropodist and Address:  [facility], [address], [place], [place] [address]      Provider Number: [id number]  Attending Physician Name and Address:  Gillis Santa, MD  Relative Name and Phone Number:  [name] (Daughter)  [phone]    Current Level of Care: Hospital Recommended Level of Care: Skilled Nursing Facility Prior Approval Number:    Date Approved/Denied:   PASRR Number: [id number]  Discharge Plan: SNF    Current Diagnoses: Patient Active Problem List   Diagnosis Date Noted   Sepsis due to pneumonia (HCC) 03/20/2023   Acute lower UTI 03/20/2023   Acute metabolic encephalopathy 03/20/2023   Type 2 diabetes mellitus with peripheral neuropathy (HCC) 03/20/2023   Essential hypertension 03/20/2023   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   Hypercalcemia 02/16/2023   Hyperparathyroidism (HCC) 02/16/2023   Hyperthyroidism 12/19/2021   Thyroid nodule 12/19/2021   Paroxysmal atrial fibrillation (HCC) 12/17/2021   Type 2 MI (myocardial infarction) (HCC) 12/15/2021   PAD (peripheral artery disease) (HCC) 07/25/2020   Splenic infarct 07/23/2019   Coagulation disorder (HCC) 05/30/2019   Pain due to onychomycosis of toenail of right foot 10/18/2018   Breast cancer of upper-outer quadrant of left female breast (HCC) 07/27/2017   Obesity with body mass index of 30.0-39.9 07/27/2017   DVT (deep venous thrombosis) (HCC) 07/27/2017   Primary cancer of upper outer quadrant of left female breast (HCC) 06/12/2017   Diarrhea 01/16/2017   Chronic diarrhea 01/12/2017   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   Right leg swelling 10/13/2016   Benign breast lumps 05/30/2015   Cancer (HCC) 05/30/2015   Depression 05/30/2015   Type 2  diabetes mellitus with other specified complication, without long-term current use of insulin (HCC) 05/30/2015   Hyperlipidemia, unspecified 05/30/2015   Hypertension 05/30/2015   Osteoporosis, post-menopausal 05/30/2015   Panic attacks 05/30/2015    Orientation RESPIRATION BLADDER Height & Weight     Self  Normal Incontinent Weight: 156 lb 8.4 oz (71 kg) Height:  5\' 3"  (160 cm)  BEHAVIORAL SYMPTOMS/MOOD NEUROLOGICAL BOWEL NUTRITION STATUS      Incontinent    AMBULATORY STATUS COMMUNICATION OF NEEDS Skin     Verbally Normal                       Personal Care Assistance Level of Assistance              Functional Limitations Info             SPECIAL CARE FACTORS FREQUENCY                       Contractures Contractures Info: Not present    Additional Factors Info  Code Status, Allergies Code Status Info: DNR Allergies Info: Ciprofloxacin  Codeine  Tape  Ciprocinonide (Fluocinolone)  Amoxicillin  Cefuroxime  Latex  Lipitor (Atorvastatin)           Current Medications (03/30/2023):  This is the current hospital active medication list Current Facility-Administered Medications  Medication Dose Route Frequency Provider Last Rate Last Admin   acetaminophen (TYLENOL) tablet 650 mg  650 mg Oral Q6H PRN Amin, Ankit C, MD   650 mg at 03/26/23 1018   cholecalciferol (VITAMIN D3) 25 MCG (1000 UNIT) tablet  1,000 Units  1,000 Units Oral Daily Mansy, Jan A, MD   1,000 Units at 03/30/23 0931   dabigatran (PRADAXA) capsule 150 mg  150 mg Oral Q12H Gillis Santa, MD   150 mg at 03/30/23 0931   diclofenac Sodium (VOLTAREN) 1 % topical gel 4 g  4 g Topical QID PRN Mansy, Jan A, MD       fluticasone Aleda Grana) 50 MCG/ACT nasal spray 2 spray  2 spray Each Nare BID Mansy, Jan A, MD   2 spray at 03/30/23 0932   gabapentin (NEURONTIN) capsule 100 mg  100 mg Oral TID Gillis Santa, MD   100 mg at 03/30/23 0931   guaiFENesin (ROBITUSSIN) 100 MG/5ML liquid 5 mL  5 mL Oral Q4H  PRN Amin, Ankit C, MD   5 mL at 03/26/23 1012   hydrALAZINE (APRESOLINE) injection 10 mg  10 mg Intravenous Q4H PRN Amin, Ankit C, MD       insulin aspart (novoLOG) injection 0-15 Units  0-15 Units Subcutaneous TID WC Mansy, Jan A, MD   5 Units at 03/30/23 1413   insulin aspart (novoLOG) injection 0-5 Units  0-5 Units Subcutaneous QHS Mansy, Jan A, MD   4 Units at 03/27/23 2201   ipratropium-albuterol (DUONEB) 0.5-2.5 (3) MG/3ML nebulizer solution 3 mL  3 mL Nebulization Q4H PRN Amin, Ankit C, MD   3 mL at 03/20/23 1611   loratadine (CLARITIN) tablet 10 mg  10 mg Oral Daily Mansy, Jan A, MD   10 mg at 03/30/23 0931   melatonin tablet 2.5 mg  2.5 mg Oral QHS Mansy, Jan A, MD   2.5 mg at 03/29/23 2116   methimazole (TAPAZOLE) tablet 10 mg  10 mg Oral Daily Mansy, Jan A, MD   10 mg at 03/30/23 0931   metoprolol tartrate (LOPRESSOR) injection 5 mg  5 mg Intravenous Q4H PRN Amin, Ankit C, MD   5 mg at 03/21/23 0922   metoprolol tartrate (LOPRESSOR) tablet 50 mg  50 mg Oral BID Mansy, Jan A, MD   50 mg at 03/30/23 0931   midodrine (PROAMATINE) tablet 5 mg  5 mg Oral TID PRN Gillis Santa, MD       ondansetron [facility]) injection 4 mg  4 mg Intravenous Q6H PRN Amin, Ankit C, MD       pantoprazole (PROTONIX) EC tablet 40 mg  40 mg Oral Daily Mansy, Jan A, MD   40 mg at 03/30/23 0931   PARoxetine (PAXIL) tablet 30 mg  30 mg Oral Daily Mansy, Jan A, MD   30 mg at 03/30/23 0931   polyethylene glycol (MIRALAX / GLYCOLAX) packet 17 g  17 g Oral Daily PRN Mansy, Jan A, MD       saccharomyces boulardii (FLORASTOR) capsule 250 mg  250 mg Oral BID Gillis Santa, MD   250 mg at 03/30/23 0931   tiZANidine (ZANAFLEX) tablet 2 mg  2 mg Oral Q12H PRN Mansy, Jan A, MD       traZODone (DESYREL) tablet 25 mg  25 mg Oral QHS PRN Gillis Santa, MD       ziprasidone (GEODON) injection 10 mg  10 mg Intramuscular Q6H PRN Mansy, Jan A, MD   10 mg at 03/20/23 1136     Discharge Medications: Please see discharge summary for a  list of discharge medications. TAKE these medications     acetaminophen 650 MG CR tablet Commonly known as: TYLENOL Take 650 mg by mouth every 8 (eight) hours as needed  for pain. Give one tablet by mouth three times daily.    dextromethorphan-guaiFENesin 10-100 MG/5ML liquid Commonly known as: ROBITUSSIN-DM Take 10 mLs by mouth every 4 (four) hours as needed for cough.    diclofenac Sodium 1 % Gel Commonly known as: VOLTAREN Apply 4 g topically. Apply to stump topically every 6 hours as needed. Apply to right knee topically three times daily for pain.    Florastor 250 MG capsule Generic drug: saccharomyces boulardii Take 250 mg by mouth daily.    fluticasone 50 MCG/ACT nasal spray Commonly known as: FLONASE Place 2 sprays into both nostrils 2 (two) times daily.    gabapentin 100 MG capsule Commonly known as: NEURONTIN Take 100 mg by mouth 3 (three) times daily.    glipiZIDE XL 2.5 MG 24 hr tablet Generic drug: glipiZIDE Take 2.5 mg by mouth daily.    hydrocortisone 2.5 % cream Apply to Hemorrhoids/buttucks topically every 8 hours as needed for hemorrhoids.    hydrocortisone cream 1 % Apply to rectum topically every 6 hours as needed    ipratropium-albuterol 0.5-2.5 (3) MG/3ML Soln Commonly known as: DUONEB Take 3 mLs by nebulization every 4 (four) hours as needed.    loratadine 10 MG tablet Commonly known as: CLARITIN Take 10 mg by mouth daily.    Magnesium Oxide 400 MG Caps Take 1 capsule (400 mg total) by mouth 2 (two) times daily for 7 days.    melatonin 3 MG Tabs tablet Take 3 mg by mouth at bedtime.    metFORMIN 500 MG tablet Commonly known as: GLUCOPHAGE Take 750 mg by mouth 2 (two) times daily with a meal.    methimazole 10 MG tablet Commonly known as: TAPAZOLE Take 1 tablet by mouth daily.    metoprolol tartrate 50 MG tablet Commonly known as: LOPRESSOR Take 50 mg by mouth 2 (two) times daily.    nystatin cream Commonly known as:  MYCOSTATIN Apply topically.    ondansetron 4 MG tablet Commonly known as: ZOFRAN Take 4 mg by mouth every 4 (four) hours as needed for nausea or vomiting.    OXYGEN 2lpm as needed for sats less than 88% every 1 hour as needed    pantoprazole 40 MG tablet Commonly known as: PROTONIX Take 40 mg by mouth daily.    PARoxetine 30 MG tablet Commonly known as: PAXIL Take 30 mg by mouth daily.    Polyethyl Glycol-Propyl Glycol 0.4-0.3 % Soln Apply to eye 2 (two) times daily as needed.    polyethylene glycol 17 g packet Commonly known as: MIRALAX / GLYCOLAX Take 17 g by mouth daily as needed.    Pradaxa 150 MG Caps capsule Generic drug: dabigatran TAKE 1 CAPSULE BY MOUTH TWICE DAILY START TREATMENT 10 HRS LAST DOSE OF LOVENOX    SUNSCREEN SPF50 EX Apply topically. Apply to exposed skin topically every 2 hours as needed for Sunburn Prevention    tiZANidine 2 MG tablet Commonly known as: ZANAFLEX Take 2 mg by mouth every 12 (twelve) hours as needed for muscle spasms.    VITAMIN D (ERGOCALCIFEROL) PO Take one tablet ( ) by mouth once daily.    Vitamin D-1000 Max St 25 MCG (1000 UT) tablet Generic drug: Cholecalciferol Take by mouth.    Zinc Oxide 12.8 % ointment Commonly known as: TRIPLE PASTE Apply to sacrum/buttocks topically every shift.      Relevant Imaging Results:  Relevant Lab Results:   Additional Information SS#: [id number]  Allena Katz, LCSW

## 2023-03-30 NOTE — TOC Progression Note (Signed)
Transition of Care [facility]) - Progression Note    Patient Details  Name: Julie Acosta MRN: [id number] Date of Birth: [birth year]  [facility]) CM/SW Contact  Allena Katz, LCSW Phone Number: 03/30/2023, 2:06 PM  Clinical Narrative:   INS requesting a P2P- number is [phone] option 5, deadline is tomorrow at 9:30am.  MD notified.    Expected Discharge Plan: Skilled Nursing Facility Barriers to Discharge: Continued Medical Work up  Expected Discharge Plan and Services         Expected Discharge Date: 03/28/23                                     Social Determinants of Health (SDOH) Interventions SDOH Screenings   Food Insecurity: No Food Insecurity (03/20/2023)  Housing: Low Risk  (03/20/2023)  Transportation Needs: No Transportation Needs (03/20/2023)  Utilities: Not At Risk (03/20/2023)  Financial Resource Strain: Low Risk  (02/16/2023)   Received from [facility]  Tobacco Use: Low Risk  (03/20/2023)    Readmission Risk Interventions     No data to display

## 2023-03-30 NOTE — Discharge Summary (Signed)
[facility] Discharge Summary   Patient: Julie Acosta BMW:[id number]  PCP: Earnestine Mealing, MD  Date of admission: 03/19/2023   Date of discharge:  03/30/2023     Discharge Diagnoses:  Principal Problem:   Sepsis due to pneumonia [facility]) Active Problems:   Acute lower UTI   Acute metabolic encephalopathy   Hyperthyroidism   Type 2 diabetes mellitus with peripheral neuropathy (HCC)   Essential hypertension   Admitted From: SNF Disposition:  LTC facility  Recommendations for Outpatient Follow-up:  Follow-up with PCP, patient should be seen by an MD in 1 to 2 days, monitor for hematuria, it has been resolved so resumed Pradaxa today. Continue bladder scan as needed to rule out urinary retention Follow up LABS/TEST: CBC and BMP in 1 week   Diet recommendation: Cardiac and Carb modified diet  Activity: The patient is advised to gradually reintroduce usual activities, as tolerated  Discharge Condition: stable  Code Status: DNR/DNI-Limited  History of present illness: As per the H and P dictated on admission Hospital Course:  89 year old with history of anxiety, depression, DM 2, GERD, HTN, HLD comes to the ED with altered mental status, confusion, hypoxia. Upon admission had mild lactic acidosis, leukocytosis with concerns of sepsis secondary to pneumonia and urinary tract infection. Started on Rocephin and azithromycin. Over the course of 48 hours her mentation significantly improved.   Assessment & Plan:   # Severe Sepsis without shock causing Metabolic Encephalopathy  # Sepsis due to pneumonia and urinary tract infection Fever curve slightly better. COVID/Flu/Resp Panel-negative, ProCal 0.17.  Blood cultures NGTD S/p azithromycin x 4 doses. S/p ceftriaxone 1 g IV daily for 5 days.  Completed course Continue IS, flutter, and nebs.    # Acute metabolic encephalopathy; resolved.  Patient is back to her baseline. CTH is neg, TSH/Ammonia/B12/folate = normal.  11/18  delirium and visual hallucinations, insomnia 11/19 d/c'd Seroquel and trazodone 25 mg nightly prn for sleep. Changed due to excessive sleepiness. # Hypokalemia, potassium repleted.  Resolved 11/25 potassium 5.5 today, hemolysis, repeat BMP next week. # Hypomagnesemia, mag repleted.   # Hypophosphatemia, phosphorus repleted. # Volume overload: Patient had elevated BNP and abnormal breath sounds Patient received IV fluid and IV electrolytes. S/p IV Lasix 20 mg.  11/21 CXR Continued low lung volumes with basilar hypoventilation especially on the left. No new cardiopulmonary abnormality. Lasix 40 mg one-time dose given # History of breast cancer status postlumpectomy 2019 # History of sarcoma:  Follows outpatient oncology # Factor V Leiden homozygous: Per oncology on lifelong Pradaxa 150 mg twice daily # Hematuria noticed on 11/18, resolved on 11/23. Held Pradaxa for 3 doses US renal: No collecting system dilatation. Bilateral renal stones. Please correlate with the prior CT sca 11/21 held Pradaxa again today due to hematuria 11/23 no hematuria, resumed Pradaxa today. Hemoglobin is stable, we will continue to monitor # Essential hypertension: continue antihypertensive therapy. # Type 2 diabetes mellitus with peripheral neuropathy: Resumed home meds.  Monitor CBG and continue diabetic diet. A1c 6.5 # Hyperthyroidism: TSH, Methomazole.  # GERD: PPI  Body mass index is 27.73 kg/m.  Nutrition Interventions:  Patient was seen by physical therapy, who recommended Therapy, SNF placement, which was arranged. On the day of the discharge the patient's vitals were stable, and no other acute medical condition were reported by patient. the patient was felt safe to be discharge at LTC facility.  Consultants: None Procedures: None  Discharge Exam: General: Appear in no distress, no Rash; Oral Mucosa Clear,  moist. 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   affect appropriate.  Filed Weights   03/20/23 0400 03/20/23 0447 03/20/23 1717  Weight: 72 kg 72 kg 71 kg   Vitals:   03/30/23 0417 03/30/23 0831  BP: (!) 148/50 (!) 146/45  Pulse: 68 64  Resp: 20 15  Temp: 98.4 F (36.9 C) 98.5 F (36.9 C)  SpO2: 94% 92%    DISCHARGE MEDICATION: Allergies as of 03/30/2023       Reactions   Ciprofloxacin Hives   Codeine Other (See Comments)   HALLUCINATIONS   Tape    Rash and skin irritation/ paper tape and tegaderm OK   Ciprocinonide [fluocinolone] Other (See Comments)   unknown   Amoxicillin Other (See Comments)   Upset stomach Has patient had a PCN reaction causing immediate rash, facial/tongue/throat swelling, SOB or lightheadedness with hypotension: No Has patient had a PCN reaction causing severe rash involving mucus membranes or skin necrosis: No Has patient had a PCN reaction that required hospitalization: No Has patient had a PCN reaction occurring within the last 10 years: Yes If all of the above answers are "NO", then may proceed with Cephalosporin use.;   Cefuroxime Other (See Comments)   OK INTRACAMERALLY PER DR WLP, upset stomach   Latex Rash   Rast test NEGATIVE   Lipitor [atorvastatin] Other (See Comments)   unknown        Medication List     STOP taking these medications    fluconazole 150 MG tablet Commonly known as: DIFLUCAN       TAKE these medications    acetaminophen 650 MG CR tablet Commonly known as: TYLENOL Take 650 mg by mouth every 8 (eight) hours as needed for pain. Give one tablet by mouth three times daily.   dextromethorphan-guaiFENesin 10-100 MG/5ML liquid Commonly known  as: ROBITUSSIN-DM Take 10 mLs by mouth every 4 (four) hours as needed for cough.   diclofenac Sodium 1 % Gel Commonly known as: VOLTAREN Apply 4 g topically. Apply to stump topically every 6 hours as needed. Apply to right knee topically three times daily for pain.   Florastor 250 MG capsule Generic drug: saccharomyces boulardii Take 250 mg by mouth daily.   fluticasone 50 MCG/ACT nasal spray Commonly known as: FLONASE Place 2 sprays into both nostrils 2 (two) times daily.   gabapentin 100 MG capsule Commonly known as: NEURONTIN Take 100 mg by mouth 3 (three) times daily.   glipiZIDE XL 2.5 MG 24 hr tablet Generic drug: glipiZIDE Take 2.5 mg by mouth daily.   hydrocortisone 2.5 % cream Apply to Hemorrhoids/buttucks topically every 8 hours as needed for hemorrhoids.   hydrocortisone cream 1 % Apply to rectum topically every 6 hours as needed   ipratropium-albuterol 0.5-2.5 (3) MG/3ML Soln Commonly known as: DUONEB Take 3 mLs by nebulization every 4 (four) hours as needed.   loratadine 10 MG tablet Commonly known as: CLARITIN Take 10 mg by mouth daily.   Magnesium Oxide 400 MG Caps Take 1 capsule (400 mg total) by mouth 2 (two) times daily for 7 days.   melatonin 3 MG Tabs tablet Take 3 mg by  mouth at bedtime.   metFORMIN 500 MG tablet Commonly known as: GLUCOPHAGE Take 750 mg by mouth 2 (two) times daily with a meal.   methimazole 10 MG tablet Commonly known as: TAPAZOLE Take 1 tablet by mouth daily.   metoprolol tartrate 50 MG tablet Commonly known as: LOPRESSOR Take 50 mg by mouth 2 (two) times daily.   nystatin cream Commonly known as: MYCOSTATIN Apply topically.   ondansetron 4 MG tablet Commonly known as: ZOFRAN Take 4 mg by mouth every 4 (four) hours as needed for nausea or vomiting.   OXYGEN 2lpm as needed for sats less than 88% every 1 hour as needed   pantoprazole 40 MG tablet Commonly known as: PROTONIX Take 40 mg by mouth daily.    PARoxetine 30 MG tablet Commonly known as: PAXIL Take 30 mg by mouth daily.   Polyethyl Glycol-Propyl Glycol 0.4-0.3 % Soln Apply to eye 2 (two) times daily as needed.   polyethylene glycol 17 g packet Commonly known as: MIRALAX / GLYCOLAX Take 17 g by mouth daily as needed.   Pradaxa 150 MG Caps capsule Generic drug: dabigatran TAKE 1 CAPSULE BY MOUTH TWICE DAILY START TREATMENT 10 HRS LAST DOSE OF LOVENOX   SUNSCREEN SPF50 EX Apply topically. Apply to exposed skin topically every 2 hours as needed for Sunburn Prevention   tiZANidine 2 MG tablet Commonly known as: ZANAFLEX Take 2 mg by mouth every 12 (twelve) hours as needed for muscle spasms.   VITAMIN D (ERGOCALCIFEROL) PO Take one tablet ( ) by mouth once daily.   Vitamin D-1000 Max St 25 MCG (1000 UT) tablet Generic drug: Cholecalciferol Take by mouth.   Zinc Oxide 12.8 % ointment Commonly known as: TRIPLE PASTE Apply to sacrum/buttocks topically every shift.       Allergies  Allergen Reactions   Ciprofloxacin Hives   Codeine Other (See Comments)    HALLUCINATIONS   Tape     Rash and skin irritation/ paper tape and tegaderm OK   Ciprocinonide [Fluocinolone] Other (See Comments)    unknown   Amoxicillin Other (See Comments)    Upset stomach Has patient had a PCN reaction causing immediate rash, facial/tongue/throat swelling, SOB or lightheadedness with hypotension: No Has patient had a PCN reaction causing severe rash involving mucus membranes or skin necrosis: No Has patient had a PCN reaction that required hospitalization: No Has patient had a PCN reaction occurring within the last 10 years: Yes If all of the above answers are "NO", then may proceed with Cephalosporin use.;   Cefuroxime Other (See Comments)    OK INTRACAMERALLY PER DR WLP, upset stomach   Latex Rash    Rast test NEGATIVE   Lipitor [Atorvastatin] Other (See Comments)    unknown   Discharge Instructions     Call MD for:   difficulty breathing, headache or visual disturbances   Complete by: As directed    Call MD for:  extreme fatigue   Complete by: As directed    Call MD for:  persistant dizziness or light-headedness   Complete by: As directed    Call MD for:  persistant nausea and vomiting   Complete by: As directed    Call MD for:  severe uncontrolled pain   Complete by: As directed    Call MD for:  temperature >100.4   Complete by: As directed    Diet - low sodium heart healthy   Complete by: As directed    Diet Carb Modified   Complete by:  As directed    Discharge instructions   Complete by: As directed    Follow-up with PCP, patient should be seen by an MD in 1 to 2 days, monitor for hematuria, it has been resolved so resumed Pradaxa today. Continue bladder scan as needed to rule out urinary retention   Increase activity slowly   Complete by: As directed        The results of significant diagnostics from this hospitalization (including imaging, microbiology, ancillary and laboratory) are listed below for reference.    Significant Diagnostic Studies: DG Chest Port 1 View  Result Date: 03/26/2023 CLINICAL DATA:  89 year old female with shortness of breath. EXAM: PORTABLE CHEST 1 VIEW COMPARISON:  Portable chest 03/21/2023 and earlier. FINDINGS: Portable AP semi upright view at 1058 hours. Continued low lung volumes. Stable cardiac size and mediastinal contours. Stable ventilation, left lung base hypo ventilation with enhancing atelectasis there on CTA 03/19/2023. No pneumothorax. Stable pulmonary vascularity, no overt edema. Paucity of bowel gas. Dextroconvex scoliosis. IMPRESSION: Continued low lung volumes with basilar hypoventilation especially on the left. No new cardiopulmonary abnormality. Electronically Signed   By: Odessa Fleming M.D.   On: 03/26/2023 12:02   US RENAL  Result Date: 03/23/2023 CLINICAL DATA:  Hematuria EXAM: RENAL / URINARY TRACT ULTRASOUND COMPLETE COMPARISON:  CT 06/27/2022.  FINDINGS: Right Kidney: Renal measurements: 11.4 x 5.3 x 4.8 cm = volume: 151.3 mL. Mild renal atrophy. No collecting system dilatation or perinephric fluid. Small renal stones identified measuring up to 5 mm. Left Kidney: Renal measurements: 11.2 x 4.7 x 4.3 cm = volume: 119.5 mL. No collecting system dilatation. Perinephric fluid. Renal stones again seen measuring up to 4 mm by ultrasound Bladder: Appears normal for degree of bladder distention. Other: None. IMPRESSION: No collecting system dilatation. Bilateral renal stones. Please correlate with the prior CT scan Electronically Signed   By: Karen Kays M.D.   On: 03/23/2023 18:03   DG Chest Port 1 View  Result Date: 03/21/2023 CLINICAL DATA:  Dyspnea. EXAM: PORTABLE CHEST 1 VIEW COMPARISON:  March 19, 2023 FINDINGS: Cardiac silhouette is enlarged.  Mediastinal contours appear intact. Mild interstitial pulmonary edema. Left lower lobe atelectasis and/or small pleural effusion. Osseous structures are without acute abnormality. Soft tissues are grossly normal. IMPRESSION: 1. Mild interstitial pulmonary edema. 2. Left lower lobe atelectasis and/or small pleural effusion. Electronically Signed   By: Ted Mcalpine M.D.   On: 03/21/2023 15:43   CT Angio Chest PE W/Cm &/Or Wo Cm  Result Date: 03/20/2023 CLINICAL DATA:  89 year old female with confusion presenting with altered mental status and hypoxia. PE suspected. EXAM: CT ANGIOGRAPHY CHEST WITH CONTRAST TECHNIQUE: Multidetector CT imaging of the chest was performed using the standard protocol during bolus administration of intravenous contrast. Multiplanar CT image reconstructions and MIPs were obtained to evaluate the vascular anatomy. RADIATION DOSE REDUCTION: This exam was performed according to the departmental dose-optimization program which includes automated exposure control, adjustment of the mA and/or kV according to patient size and/or use of iterative reconstruction technique. CONTRAST:   OMNIPAQUE IOHEXOL 350 MG/ML SOLN COMPARISON:  Chest radiograph earlier today and CT 06/27/2022 FINDINGS: Cardiovascular: Cardiomegaly. No pericardial effusion. Mitral annular calcification. Aortic and coronary artery calcification. Negative for acute pulmonary embolism. Mediastinum/Nodes: Trachea and esophagus are unremarkable. No thoracic adenopathy. Lungs/Pleura: Similar atelectasis or consolidation in the left lower lobe. Expiratory phase exam. Mosaic attenuation of the lungs favors air trapping though a component of pulmonary edema is not excluded. Redemonstrated pulmonary nodules are unchanged  measuring up to 7 mm in the right lower lobe (series 7/image 65) and 6 mm in the right middle lobe (7/68). No pleural effusion or pneumothorax. Upper Abdomen: No acute abnormality. Musculoskeletal: No acute fracture. Review of the MIP images confirms the above findings. IMPRESSION: 1. Negative for acute pulmonary embolism. 2. Similar atelectasis or consolidation in the left lower lobe. 3. Mosaic attenuation of the lungs favors air trapping from bronchiolitis though a component of pulmonary edema is not excluded. 4. Unchanged pulmonary nodules measuring up to 7 mm in the right lower lobe. Aortic Atherosclerosis (ICD10-I70.0). Electronically Signed   By: Minerva Fester M.D.   On: 03/20/2023 00:46   CT HEAD WO CONTRAST  Result Date: 03/20/2023 CLINICAL DATA:  Mental status change, unknown cause EXAM: CT HEAD WITHOUT CONTRAST TECHNIQUE: Contiguous axial images were obtained from the base of the skull through the vertex without intravenous contrast. RADIATION DOSE REDUCTION: This exam was performed according to the departmental dose-optimization program which includes automated exposure control, adjustment of the mA and/or kV according to patient size and/or use of iterative reconstruction technique. COMPARISON:  CT head 01/10/2021 FINDINGS: Brain: Patchy and confluent areas of decreased attenuation are noted  throughout the deep and periventricular white matter of the cerebral hemispheres bilaterally, compatible with chronic microvascular ischemic disease. No evidence of large-territorial acute infarction. No parenchymal hemorrhage. No mass lesion. No extra-axial collection. No mass effect or midline shift. No hydrocephalus. Basilar cisterns are patent. Vascular: No hyperdense vessel. Skull: No acute fracture or focal lesion. Sinuses/Orbits: Paranasal sinuses and mastoid air cells are clear. Bilateral lens replacement. Otherwise the orbits are unremarkable. Other: None. IMPRESSION: No acute intracranial abnormality. Electronically Signed   By: Tish Frederickson M.D.   On: 03/20/2023 00:40   DG Chest 1 View  Result Date: 03/19/2023 CLINICAL DATA:  Altered mental status. EXAM: CHEST  1 VIEW COMPARISON:  December 15, 2021 FINDINGS: There is stable mild to moderate severity cardiac silhouette enlargement. Marked severity calcification of the aortic arch is seen. Low lung volumes are noted with mild, diffuse, chronic appearing increased interstitial lung markings. Mild areas of scarring and/or atelectasis are seen within the bilateral lung bases. There is a small left pleural effusion. No pneumothorax is identified. No acute osseous abnormalities are identified. IMPRESSION: 1. Cardiomegaly and low lung volumes with mild bibasilar scarring and/or atelectasis. 2. Small left pleural effusion. Electronically Signed   By: Aram Candela M.D.   On: 03/19/2023 21:41    Microbiology: No results found for this or any previous visit (from the past 240 hour(s)).   Labs: CBC: Recent Labs  Lab 03/25/23 0512 03/26/23 0501 03/27/23 0545 03/28/23 0426 03/29/23 0414  WBC 9.8 11.7* 11.5* 11.0* 10.1  HGB 11.4* 11.1* 11.2* 10.6* 10.9*  HCT 37.1 35.8* 35.5* 34.1* 35.1*  MCV 93.5 92.5 91.0 90.9 92.9  PLT 252 275 272 278 312   Basic Metabolic Panel: Recent Labs  Lab 03/26/23 0501 03/27/23 0545 03/28/23 0426  03/29/23 0414 03/30/23 0642  NA 142 141 138 138 137  K 3.7 3.8 4.2 4.4 5.5*  CL 103 103 101 103 103  CO2 30 30 29 27 25   GLUCOSE 187* 178* 177* 173* 202*  BUN 11 12 11 10 8   CREATININE 0.50 0.48 0.49 0.50 0.62  CALCIUM 9.2 8.9 8.7* 9.1 9.3  MG 2.0 1.7 1.5* 1.9 1.7  PHOS 1.8* 2.1* 2.5 1.9* 2.4*   Liver Function Tests: No results for input(s): "AST", "ALT", "ALKPHOS", "BILITOT", "PROT", "ALBUMIN" in the last  168 hours. No results for input(s): "LIPASE", "AMYLASE" in the last 168 hours. No results for input(s): "AMMONIA" in the last 168 hours. Cardiac Enzymes: No results for input(s): "CKTOTAL", "CKMB", "CKMBINDEX", "TROPONINI" in the last 168 hours. BNP (last 3 results) Recent Labs    03/19/23 1849 03/22/23 0500  BNP 230.1* 1,024.4*   CBG: Recent Labs  Lab 03/29/23 1658 03/29/23 2056 03/30/23 0041 03/30/23 0825 03/30/23 1139  GLUCAP 144* 159* 173* 205* 238*    Time spent: 35 minutes  Signed:  Gillis Santa  [facility] 03/30/2023 2:36 PM

## 2023-03-30 NOTE — Care Management Important Message (Signed)
Important Message  Patient Details  Name: [name] MRN: [id number] Date of Birth: Oct 04, 1933   Important Message Given:  Yes - Medicare IM  I reviewed the form with POA, Randal Buba, daughter [phone] and she is agreement with the discharge. I thanked her for her time.    Olegario Messier A Yamna Mackel 03/30/2023, 9:41 AM

## 2023-03-31 ENCOUNTER — Encounter: Payer: Self-pay | Admitting: Nurse Practitioner

## 2023-03-31 ENCOUNTER — Non-Acute Institutional Stay (SKILLED_NURSING_FACILITY): Payer: Medicare Other | Admitting: Nurse Practitioner

## 2023-03-31 DIAGNOSIS — Z66 Do not resuscitate: Secondary | ICD-10-CM

## 2023-03-31 DIAGNOSIS — J189 Pneumonia, unspecified organism: Secondary | ICD-10-CM | POA: Diagnosis not present

## 2023-03-31 DIAGNOSIS — B372 Candidiasis of skin and nail: Secondary | ICD-10-CM

## 2023-03-31 DIAGNOSIS — A419 Sepsis, unspecified organism: Secondary | ICD-10-CM

## 2023-03-31 DIAGNOSIS — R21 Rash and other nonspecific skin eruption: Secondary | ICD-10-CM

## 2023-03-31 DIAGNOSIS — R0902 Hypoxemia: Secondary | ICD-10-CM

## 2023-03-31 NOTE — Progress Notes (Signed)
Location:  Other [facility]) Nursing Home Room Number: 215 A Place of Service:  SNF (31)  Julie Acosta K. Janyth Contes, NP    Patient Care Team: Earnestine Mealing, MD as PCP - General (Family Medicine) Lemar Livings, Merrily Pew, MD (General Surgery) Jeralyn Ruths, MD as Consulting Physician (Oncology)  Extended Emergency Contact Information Primary Emergency Contact: [name] Address: [address]          [place], [place] [address] [place] of [place] Home Phone: [phone] Mobile Phone: [phone] Relation: Daughter  Goals of care: Advanced Directive information    03/31/2023    1:15 PM  Advanced Directives  Does Patient Have a Medical Advance Directive? Yes  Type of Advance Directive Out of facility DNR (pink MOST or yellow form)  Does patient want to make changes to medical advance directive? No - Patient declined  Pre-existing out of facility DNR order (yellow form or pink MOST form) Yellow form placed in chart (order not valid for inpatient use)     Chief Complaint  Patient presents with   Acute Visit    Rash     HPI:  Pt is a 89 y.o. female seen today for an acute visit for hosptial follow up and rash to back, under arms and abdominal fold.   She was recently hospitalized for pneumonia, UTI and sepsis and has completed multiple antibiotics.  No worsening shortness of breath or cough noted. O2 sats low on room air.  Pt was noted to be confused during hospitalization but mental status is improving at this time.    Past Medical History:  Diagnosis Date   Anxiety    Benign breast lumps    Blood clotting disorder (HCC)    Bone cancer (HCC)    head of femur, left leg ... 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  Oncotype recurrence score: 1.  DCIS, margin less than 0.5 mm.  Negative sentinel node.  Accelerated partial breast  radiation.   Cervicalgia    Chronic kidney disease    kidney stones   Colon cancer [facility])    patient unaware of this   Complication of anesthesia    mood alteration / not sure if d/t pain medicine or anesthesia as she passed out    Cough    NASAL DRIP / SNEEZING / SORE THROAT MOSTLY CONSTANT   Depression    Diabetes (HCC)    Diverticulitis    Dizzy    GERD (gastroesophageal reflux disease)    H/O blood clots    arm and leg   HBP (high blood pressure)    Hematuria    gross   Hemorrhoids    HLD (hyperlipidemia)    Microscopic hematuria 06/02/2015   Muscle pain    Osteoarthritis    Osteoporosis    Panic disorder    Personal history of radiation therapy    PND (post-nasal drip)    CHRONIC WITH SORE THROAT AND SNEEZING   Reflux    Sepsis (HCC)    Sepsis, unspecified organism (HCC) 12/15/2021   Formatting of this note might be different from the original.  Last Assessment & Plan: Formatting of this note might be different from the original. Appears to be multifactorial and secondary to healthcare associated pneumonia as well as a UTI CT angio shows asymmetric airspace disease at the left base and posteriorly in the right  upper lobe concerning for pneumonia Patient also has pyuria Patient   Severe sepsis (HCC) 12/15/2021   Skin cancer    nose   Swelling    Tremor    Tremors of nervous system    Ureteral stone with hydronephrosis 11/26/2018   Past Surgical History:  Procedure Laterality Date   APPENDECTOMY  1962   BREAST BIOPSY Right 2006?   benign   BREAST BIOPSY Left 2019   invasive mammary carcinoma   BREAST CYST EXCISION Left 07/27/2017   Procedure: SKIN CYST EXCISED;  Surgeon: Earline Mayotte, MD;  Location: [facility] ORS;  Service: General;  Laterality: Left;   BREAST LUMPECTOMY Left 07/2017   invasive mammary, DCIS  mammosite   BREAST SURGERY Right 2019   CARPAL TUNNEL RELEASE Right    CATARACT EXTRACTION W/PHACO Left 11/11/2016   Procedure: CATARACT EXTRACTION PHACO  AND INTRAOCULAR LENS PLACEMENT (IOC);  Surgeon: Galen Manila, MD;  Location: [facility] ORS;  Service: Ophthalmology;  Laterality: Left;  [place] 01:07.9 AP% 19.2 CDE 13.02 Fluid pack lot # [id number]   CATARACT EXTRACTION W/PHACO Right 12/09/2016   Procedure: CATARACT EXTRACTION PHACO AND INTRAOCULAR LENS PLACEMENT (IOC);  Surgeon: Galen Manila, MD;  Location: [facility] ORS;  Service: Ophthalmology;  Laterality: Right;  [place] 00:49 AP% 21.2 CDE 10.39 Fluid pack lot # [id number]   CHOLECYSTECTOMY     COLONOSCOPY WITH PROPOFOL N/A 11/22/2014   Procedure: COLONOSCOPY WITH PROPOFOL;  Surgeon: Scot Jun, MD;  Location: [facility] ENDOSCOPY;  Service: Endoscopy;  Laterality: N/A;   CYSTOSCOPY WITH STENT PLACEMENT Bilateral 11/27/2018   Procedure: CYSTOSCOPY WITH STENT PLACEMENT;  Surgeon: Jerilee Field, MD;  Location: [facility] ORS;  Service: Urology;  Laterality: Bilateral;   CYSTOSCOPY/URETEROSCOPY/HOLMIUM LASER/STENT PLACEMENT Bilateral 12/17/2018   Procedure: CYSTOSCOPY/URETEROSCOPY/HOLMIUM LASER/STENT Exchange;  Surgeon: Sondra Come, MD;  Location: [facility] ORS;  Service: Urology;  Laterality: Bilateral;   ESOPHAGOGASTRODUODENOSCOPY  11/22/2014   Procedure: ESOPHAGOGASTRODUODENOSCOPY (EGD);  Surgeon: Scot Jun, MD;  Location: [facility] ENDOSCOPY;  Service: Endoscopy;;   ESOPHAGOGASTRODUODENOSCOPY (EGD) WITH PROPOFOL N/A 03/05/2017   Procedure: ESOPHAGOGASTRODUODENOSCOPY (EGD) WITH PROPOFOL;  Surgeon: Toney Reil, MD;  Location: [facility] ENDOSCOPY;  Service: Gastroenterology;  Laterality: N/A;   EXTRACORPOREAL SHOCK WAVE LITHOTRIPSY Right 03/04/2018   Procedure: EXTRACORPOREAL SHOCK WAVE LITHOTRIPSY (ESWL);  Surgeon: Sondra Come, MD;  Location: [facility] ORS;  Service: Urology;  Laterality: Right;   EYE SURGERY Bilateral 2012   cataract extraction with iol   GALLBLADDER SURGERY  1968   hemi pelvectomy     left side at age 22   HEMIPELVIC Left 1962   LEG SURGERY Left    AMPUTATION d/t cancer at 89  years old   MASTECTOMY, PARTIAL Left 07/27/2017   Procedure: MASTECTOMY PARTIAL;  Surgeon: Earline Mayotte, MD;  Location: [facility] ORS;  Service: General;  Laterality: Left;   MOUTH SURGERY  2019   teeth pulled with a bridge insertion.   fell out 12/16/18   OVARY SURGERY Right    cyst removed   SAVORY DILATION  11/22/2014   Procedure: SAVORY DILATION;  Surgeon: Scot Jun, MD;  Location: [facility] ENDOSCOPY;  Service: Endoscopy;;   SENTINEL NODE BIOPSY Left 07/27/2017   Procedure: SENTINEL NODE BIOPSY;  Surgeon: Earline Mayotte, MD;  Location: [facility] ORS;  Service: General;  Laterality: Left;   SKIN CANCER EXCISION     nose and arm and face   TEE WITHOUT CARDIOVERSION N/A 07/25/2019   Procedure: TRANSESOPHAGEAL ECHOCARDIOGRAM (TEE);  Surgeon: Lamar Blinks, MD;  Location: [facility] ORS;  Service: Cardiovascular;  Laterality: N/A;   TONSILLECTOMY      Allergies  Allergen Reactions   Ciprofloxacin Hives   Codeine Other (See Comments)    HALLUCINATIONS   Tape     Rash and skin irritation/ paper tape and tegaderm OK   Ciprocinonide [Fluocinolone] Other (See Comments)    unknown   Amoxicillin Other (See Comments)    Upset stomach Has patient had a PCN reaction causing immediate rash, facial/tongue/throat swelling, SOB or lightheadedness with hypotension: No Has patient had a PCN reaction causing severe rash involving mucus membranes or skin necrosis: No Has patient had a PCN reaction that required hospitalization: No Has patient had a PCN reaction occurring within the last 10 years: Yes If all of the above answers are "NO", then may proceed with Cephalosporin use.;   Cefuroxime Other (See Comments)    OK INTRACAMERALLY PER DR WLP, upset stomach   Latex Rash    Rast test NEGATIVE   Lipitor [Atorvastatin] Other (See Comments)    unknown    Outpatient Encounter Medications as of 03/31/2023  Medication Sig   acetaminophen (TYLENOL) 650 MG CR tablet Take 650 mg by mouth every 8 (eight)  hours as needed for pain. Give one tablet by mouth three times daily.   Cholecalciferol (VITAMIN D-1000 MAX ST) 25 MCG (1000 UT) tablet Take by mouth.   dextromethorphan-guaiFENesin (ROBITUSSIN-DM) 10-100 MG/5ML liquid Take 10 mLs by mouth every 4 (four) hours as needed for cough.   diclofenac Sodium (VOLTAREN) 1 % GEL Apply 4 g topically. Apply to stump topically every 6 hours as needed. Apply to right knee topically three times daily for pain.   fluticasone (FLONASE) 50 MCG/ACT nasal spray Place 2 sprays into both nostrils 2 (two) times daily.   gabapentin (NEURONTIN) 100 MG capsule Take 100 mg by mouth 3 (three) times daily.   GLIPIZIDE XL 2.5 MG 24 hr tablet Take 2.5 mg by mouth daily.    hydrocortisone 2.5 % cream Apply to Hemorrhoids/buttucks topically every 8 hours as needed for hemorrhoids.   hydrocortisone cream 1 % Apply to rectum topically every 6 hours as needed   ipratropium-albuterol (DUONEB) 0.5-2.5 (3) MG/3ML SOLN Take 3 mLs by nebulization every 4 (four) hours as needed.   loratadine (CLARITIN) 10 MG tablet Take 10 mg by mouth daily.   Magnesium Oxide 400 MG CAPS Take 1 capsule (400 mg total) by mouth 2 (two) times daily for 7 days.   melatonin 3 MG TABS tablet Take 3 mg by mouth at bedtime.   metFORMIN (GLUCOPHAGE) 500 MG tablet Take 750 mg by mouth 2 (two) times daily with a meal.   methimazole (TAPAZOLE) 10 MG tablet Take 1 tablet by mouth daily.   metoprolol tartrate (LOPRESSOR) 50 MG tablet Take 50 mg by mouth 2 (two) times daily.   nystatin cream (MYCOSTATIN) Apply 1 Application topically as directed.  Apply to abdominal folds topically every day and evening shift for Fungal Rash,  Apply to axilla bilateral topically every day and evening shift for Fungal Rash, Apply to groin topically as needed for rash, lastly Apply to groin topically every day and evening shift for Fungal Rash. Total of 4 different sets of instructions per [facility]   ondansetron (ZOFRAN) 4 MG tablet  Take 4 mg by mouth every 4 (four) hours as needed for nausea or vomiting.   OXYGEN 2lpm as needed for sats less than 88% every 1 hour as needed   pantoprazole (PROTONIX)  40 MG tablet Take 40 mg by mouth daily.   PARoxetine (PAXIL) 30 MG tablet Take 30 mg by mouth daily.   Polyethyl Glycol-Propyl Glycol 0.4-0.3 % SOLN Apply to eye 2 (two) times daily as needed.   polyethylene glycol (MIRALAX / GLYCOLAX) 17 g packet Take 17 g by mouth daily as needed.   PRADAXA 150 MG CAPS capsule TAKE 1 CAPSULE BY MOUTH TWICE DAILY START TREATMENT 10 HRS LAST DOSE OF LOVENOX   saccharomyces boulardii (FLORASTOR) 250 MG capsule Take 250 mg by mouth daily.   SUNSCREEN SPF50 EX Apply topically. Apply to exposed skin topically every 2 hours as needed for Sunburn Prevention   tiZANidine (ZANAFLEX) 2 MG tablet Take 2 mg by mouth every 12 (twelve) hours as needed for muscle spasms.   triamcinolone cream (KENALOG) 0.1 % Apply 1 Application topically as directed.  Apply to Back topically every day and evening shift for Rash/Itching   Zinc Oxide (TRIPLE PASTE) 12.8 % ointment Apply to sacrum/buttocks topically every shift. (Patient not taking: Reported on 03/31/2023)   No facility-administered encounter medications on file as of 03/31/2023.    Review of Systems  Constitutional:  Negative for activity change, appetite change, fatigue and unexpected weight change.  HENT:  Negative for congestion and hearing loss.   Eyes: Negative.   Respiratory:  Negative for cough and shortness of breath.   Cardiovascular:  Negative for chest pain, palpitations and leg swelling.  Gastrointestinal:  Negative for abdominal pain, constipation and diarrhea.  Genitourinary:  Negative for difficulty urinating and dysuria.  Musculoskeletal:  Negative for arthralgias and myalgias.  Skin:  Positive for color change and rash. Negative for wound.  Neurological:  Positive for weakness. Negative for dizziness.  Psychiatric/Behavioral:  Positive  for confusion. Negative for agitation and behavioral problems.     Immunization History  Administered Date(s) Administered   Influenza, High Dose Seasonal PF 03/07/2023   Influenza-Unspecified 02/03/2020, 02/18/2021, 02/18/2022   Moderna Covid-19 Fall Seasonal Vaccine 20yrs & older 08/12/2022   PNEUMOCOCCAL CONJUGATE-20 01/17/2023   Pneumococcal Conjugate-13 09/28/2017   Tdap 12/31/2022   Unspecified SARS-COV-2 Vaccination 05/20/2019, 05/20/2019, 06/17/2019, 03/16/2020, 09/20/2020, 01/25/2021, 01/30/2023   Pertinent  Health Maintenance Due  Topic Date Due   HEMOGLOBIN A1C  09/17/2023   OPHTHALMOLOGY EXAM  10/30/2023   FOOT EXAM  12/11/2023   INFLUENZA VACCINE  Completed   DEXA SCAN  Completed      12/18/2021    7:45 PM 12/19/2021    7:00 AM 12/19/2021   12:00 PM 01/24/2022   11:20 AM 12/11/2022    1:31 PM  Fall Risk  Falls in the past year?     0  (RETIRED) Patient Fall Risk Level High fall risk High fall risk High fall risk High fall risk    Functional Status Survey:    Vitals:   03/31/23 1313 03/31/23 1314  BP: (!) 152/73 (!) 158/78  Pulse: 83   Temp: (!) 97.2 F (36.2 C)   SpO2: 96%   Weight: 155 lb 6.4 oz (70.5 kg)   Height: 5\' 3"  (1.6 m)    Body mass index is 27.53 kg/m. Physical Exam Constitutional:      General: She is not in acute distress.    Appearance: She is well-developed. She is not diaphoretic.  HENT:     Head: Normocephalic and atraumatic.     Mouth/Throat:     Pharynx: No oropharyngeal exudate.  Eyes:     Conjunctiva/sclera: Conjunctivae normal.     Pupils:  Pupils are equal, round, and reactive to light.  Cardiovascular:     Rate and Rhythm: Normal rate and regular rhythm.     Heart sounds: Normal heart sounds.  Pulmonary:     Effort: Pulmonary effort is normal.     Breath sounds: Normal breath sounds.  Abdominal:     General: Bowel sounds are normal.     Palpations: Abdomen is soft.  Musculoskeletal:     Cervical back: Normal range of  motion and neck supple.     Right lower leg: No edema.     Left lower leg: No edema.  Skin:    General: Skin is warm and dry.     Comments: Yeast rash noted under bilateral arms and abdominal fold.   maculopapular rash noted to left mid back  Neurological:     Mental Status: She is alert.  Psychiatric:        Mood and Affect: Mood normal.     Labs reviewed: Recent Labs    03/28/23 0426 03/29/23 0414 03/30/23 0642  NA 138 138 137  K 4.2 4.4 5.5*  CL 101 103 103  CO2 29 27 25   GLUCOSE 177* 173* 202*  BUN 11 10 8   CREATININE 0.49 0.50 0.62  CALCIUM 8.7* 9.1 9.3  MG 1.5* 1.9 1.7  PHOS 2.5 1.9* 2.4*   Recent Labs    03/21/23 0502 03/22/23 0500 03/23/23 0507  AST 18 15 22   ALT 10 12 14   ALKPHOS 45 46 53  BILITOT 0.4 <0.2 0.4  PROT 5.7* 5.9* 6.3*  ALBUMIN 2.8* 2.7* 2.8*   Recent Labs    02/03/23 1004 03/09/23 0000 03/19/23 1849 03/21/23 0502 03/27/23 0545 03/28/23 0426 03/29/23 0414  WBC 8.6 7.8 13.1*   < > 11.5* 11.0* 10.1  NEUTROABS 5.5 4,984.00 9.8*  --   --   --   --   HGB 13.4 11.8* 12.6   < > 11.2* 10.6* 10.9*  HCT 42.3 37 40.5   < > 35.5* 34.1* 35.1*  MCV 92.0  --  91.4   < > 91.0 90.9 92.9  PLT 174 180 244   < > 272 278 312   < > = values in this interval not displayed.   Lab Results  Component Value Date   TSH 0.307 (L) 03/20/2023   Lab Results  Component Value Date   HGBA1C 6.5 (H) 03/20/2023   Lab Results  Component Value Date   CHOL 110 12/15/2021   HDL 19 (L) 12/15/2021   LDLCALC 46 12/15/2021   TRIG 223 (H) 12/15/2021   CHOLHDL 5.8 12/15/2021    Significant Diagnostic Results in last 30 days:  DG Chest Port 1 View  Result Date: 03/26/2023 CLINICAL DATA:  89 year old female with shortness of breath. EXAM: PORTABLE CHEST 1 VIEW COMPARISON:  Portable chest 03/21/2023 and earlier. FINDINGS: Portable AP semi upright view at 1058 hours. Continued low lung volumes. Stable cardiac size and mediastinal contours. Stable ventilation, left  lung base hypo ventilation with enhancing atelectasis there on CTA 03/19/2023. No pneumothorax. Stable pulmonary vascularity, no overt edema. Paucity of bowel gas. Dextroconvex scoliosis. IMPRESSION: Continued low lung volumes with basilar hypoventilation especially on the left. No new cardiopulmonary abnormality. Electronically Signed   By: Odessa Fleming M.D.   On: 03/26/2023 12:02   US RENAL  Result Date: 03/23/2023 CLINICAL DATA:  Hematuria EXAM: RENAL / URINARY TRACT ULTRASOUND COMPLETE COMPARISON:  CT 06/27/2022. FINDINGS: Right Kidney: Renal measurements: 11.4 x 5.3 x 4.8 cm =  volume: 151.3 mL. Mild renal atrophy. No collecting system dilatation or perinephric fluid. Small renal stones identified measuring up to 5 mm. Left Kidney: Renal measurements: 11.2 x 4.7 x 4.3 cm = volume: 119.5 mL. No collecting system dilatation. Perinephric fluid. Renal stones again seen measuring up to 4 mm by ultrasound Bladder: Appears normal for degree of bladder distention. Other: None. IMPRESSION: No collecting system dilatation. Bilateral renal stones. Please correlate with the prior CT scan Electronically Signed   By: Karen Kays M.D.   On: 03/23/2023 18:03   DG Chest Port 1 View  Result Date: 03/21/2023 CLINICAL DATA:  Dyspnea. EXAM: PORTABLE CHEST 1 VIEW COMPARISON:  March 19, 2023 FINDINGS: Cardiac silhouette is enlarged.  Mediastinal contours appear intact. Mild interstitial pulmonary edema. Left lower lobe atelectasis and/or small pleural effusion. Osseous structures are without acute abnormality. Soft tissues are grossly normal. IMPRESSION: 1. Mild interstitial pulmonary edema. 2. Left lower lobe atelectasis and/or small pleural effusion. Electronically Signed   By: Ted Mcalpine M.D.   On: 03/21/2023 15:43   CT Angio Chest PE W/Cm &/Or Wo Cm  Result Date: 03/20/2023 CLINICAL DATA:  89 year old female with confusion presenting with altered mental status and hypoxia. PE suspected. EXAM: CT ANGIOGRAPHY  CHEST WITH CONTRAST TECHNIQUE: Multidetector CT imaging of the chest was performed using the standard protocol during bolus administration of intravenous contrast. Multiplanar CT image reconstructions and MIPs were obtained to evaluate the vascular anatomy. RADIATION DOSE REDUCTION: This exam was performed according to the departmental dose-optimization program which includes automated exposure control, adjustment of the mA and/or kV according to patient size and/or use of iterative reconstruction technique. CONTRAST:  OMNIPAQUE IOHEXOL 350 MG/ML SOLN COMPARISON:  Chest radiograph earlier today and CT 06/27/2022 FINDINGS: Cardiovascular: Cardiomegaly. No pericardial effusion. Mitral annular calcification. Aortic and coronary artery calcification. Negative for acute pulmonary embolism. Mediastinum/Nodes: Trachea and esophagus are unremarkable. No thoracic adenopathy. Lungs/Pleura: Similar atelectasis or consolidation in the left lower lobe. Expiratory phase exam. Mosaic attenuation of the lungs favors air trapping though a component of pulmonary edema is not excluded. Redemonstrated pulmonary nodules are unchanged measuring up to 7 mm in the right lower lobe (series 7/image 65) and 6 mm in the right middle lobe (7/68). No pleural effusion or pneumothorax. Upper Abdomen: No acute abnormality. Musculoskeletal: No acute fracture. Review of the MIP images confirms the above findings. IMPRESSION: 1. Negative for acute pulmonary embolism. 2. Similar atelectasis or consolidation in the left lower lobe. 3. Mosaic attenuation of the lungs favors air trapping from bronchiolitis though a component of pulmonary edema is not excluded. 4. Unchanged pulmonary nodules measuring up to 7 mm in the right lower lobe. Aortic Atherosclerosis (ICD10-I70.0). Electronically Signed   By: Minerva Fester M.D.   On: 03/20/2023 00:46   CT HEAD WO CONTRAST  Result Date: 03/20/2023 CLINICAL DATA:  Mental status change, unknown cause  EXAM: CT HEAD WITHOUT CONTRAST TECHNIQUE: Contiguous axial images were obtained from the base of the skull through the vertex without intravenous contrast. RADIATION DOSE REDUCTION: This exam was performed according to the departmental dose-optimization program which includes automated exposure control, adjustment of the mA and/or kV according to patient size and/or use of iterative reconstruction technique. COMPARISON:  CT head 01/10/2021 FINDINGS: Brain: Patchy and confluent areas of decreased attenuation are noted throughout the deep and periventricular white matter of the cerebral hemispheres bilaterally, compatible with chronic microvascular ischemic disease. No evidence of large-territorial acute infarction. No parenchymal hemorrhage. No mass lesion. No extra-axial collection.  No mass effect or midline shift. No hydrocephalus. Basilar cisterns are patent. Vascular: No hyperdense vessel. Skull: No acute fracture or focal lesion. Sinuses/Orbits: Paranasal sinuses and mastoid air cells are clear. Bilateral lens replacement. Otherwise the orbits are unremarkable. Other: None. IMPRESSION: No acute intracranial abnormality. Electronically Signed   By: Tish Frederickson M.D.   On: 03/20/2023 00:40   DG Chest 1 View  Result Date: 03/19/2023 CLINICAL DATA:  Altered mental status. EXAM: CHEST  1 VIEW COMPARISON:  December 15, 2021 FINDINGS: There is stable mild to moderate severity cardiac silhouette enlargement. Marked severity calcification of the aortic arch is seen. Low lung volumes are noted with mild, diffuse, chronic appearing increased interstitial lung markings. Mild areas of scarring and/or atelectasis are seen within the bilateral lung bases. There is a small left pleural effusion. No pneumothorax is identified. No acute osseous abnormalities are identified. IMPRESSION: 1. Cardiomegaly and low lung volumes with mild bibasilar scarring and/or atelectasis. 2. Small left pleural effusion. Electronically Signed    By: Aram Candela M.D.   On: 03/19/2023 21:41    Assessment/Plan 1. Yeast dermatitis Under bilateral arms and adominal fold To apply nystatin cream BID until resolves  2. Rash -itchy area to back. Appears to be improving from hospitalization, To use triamcinolone cream BID PRN x 1 week  3. Sepsis due to pneumonia [facility]) S/p azithromycin x 4 doses. S/p ceftriaxone 1 g IV daily for 5 days.  Completed course. Wbc trending down on discharge  4. DNR (do not resuscitate) - Do not attempt resuscitation (DNR)  5. Hypoxia O2 today ranging from 86-89%, no worsening shortness of breath, cough or congestion To use O2 at 2L to keep O2 stats greater than 90%   Teagan Ozawa K. Biagio Borg [facility] [phone]

## 2023-04-01 ENCOUNTER — Non-Acute Institutional Stay (SKILLED_NURSING_FACILITY): Payer: Medicare Other | Admitting: Student

## 2023-04-01 ENCOUNTER — Encounter: Payer: Self-pay | Admitting: Student

## 2023-04-01 DIAGNOSIS — I480 Paroxysmal atrial fibrillation: Secondary | ICD-10-CM | POA: Diagnosis not present

## 2023-04-01 DIAGNOSIS — R410 Disorientation, unspecified: Secondary | ICD-10-CM

## 2023-04-01 DIAGNOSIS — D6851 Activated protein C resistance: Secondary | ICD-10-CM

## 2023-04-01 DIAGNOSIS — I10 Essential (primary) hypertension: Secondary | ICD-10-CM

## 2023-04-01 DIAGNOSIS — E1169 Type 2 diabetes mellitus with other specified complication: Secondary | ICD-10-CM

## 2023-04-01 DIAGNOSIS — J189 Pneumonia, unspecified organism: Secondary | ICD-10-CM

## 2023-04-01 DIAGNOSIS — A419 Sepsis, unspecified organism: Secondary | ICD-10-CM

## 2023-04-01 DIAGNOSIS — F3341 Major depressive disorder, recurrent, in partial remission: Secondary | ICD-10-CM

## 2023-04-01 DIAGNOSIS — I739 Peripheral vascular disease, unspecified: Secondary | ICD-10-CM

## 2023-04-01 DIAGNOSIS — S88912S Complete traumatic amputation of left lower leg, level unspecified, sequela: Secondary | ICD-10-CM | POA: Diagnosis not present

## 2023-04-01 NOTE — Progress Notes (Unsigned)
Provider:  Earnestine Mealing, MD Location:  Other ([facility] Room Number: 215A Place of Service:  SNF (31)  PCP: Earnestine Mealing, MD Patient Care Team: Earnestine Mealing, MD as PCP - General (Family Medicine) Lemar Livings, Merrily Pew, MD (General Surgery) Jeralyn Ruths, MD as Consulting Physician (Oncology)  Extended Emergency Contact Information Primary Emergency Contact: [name] Address: [address]          [place], [place] [address] [place] of [place] Home Phone: [phone] Mobile Phone: [phone] Relation: Daughter  Code Status: DNR Goals of Care: Advanced Directive information    04/01/2023   10:02 AM  Advanced Directives  Does Patient Have a Medical Advance Directive? Yes  Type of Advance Directive Out of facility DNR (pink MOST or yellow form)  Does patient want to make changes to medical advance directive? No - Patient declined  Pre-existing out of facility DNR order (yellow form or pink MOST form) Yellow form placed in chart (order not valid for inpatient use)      Chief Complaint  Patient presents with   New Admit To SNF    New admission to [facility].    HPI: Patient is a 89 y.o. female seen today for readmission to facility. She is a LTC resident who was admitted to the hospital for confusion found to have severe sepsis secondary to aspiration pneumonia. CT head negative. CTA chest negative for pulmonary embolism. She had notable electrolyte abnormalities which are now repleted. Patient with volume overload due fluid resuscitation. Renal ultrasound for painless hematuria notable for nephrolithiasis.   She states she is feeling much better at this time. She worries that staying in the hospital longer made her more confused. She's states she does NOT want to return to the hospital in the future no matter how sick she is.   Past Medical History:  Diagnosis Date   Anxiety    Benign breast lumps    Blood clotting disorder (HCC)     Bone cancer (HCC)    head of femur, left leg ... 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  Oncotype recurrence score: 1.  DCIS, margin less than 0.5 mm.  Negative sentinel node.  Accelerated partial breast radiation.   Cervicalgia    Chronic kidney disease    kidney stones   Colon cancer [facility])    patient unaware of this   Complication of anesthesia    mood alteration / not sure if d/t pain medicine or anesthesia as she passed out    Cough    NASAL DRIP / SNEEZING / SORE THROAT MOSTLY CONSTANT   Depression    Diabetes (HCC)    Diverticulitis    Dizzy    GERD (gastroesophageal reflux disease)    H/O blood clots    arm and leg   HBP (high blood pressure)    Hematuria    gross   Hemorrhoids    HLD (hyperlipidemia)    Microscopic hematuria 06/02/2015   Muscle pain    Osteoarthritis    Osteoporosis    Panic disorder    Personal history of radiation therapy    PND (post-nasal drip)    CHRONIC WITH SORE THROAT AND SNEEZING   Reflux    Sepsis (HCC)    Sepsis, unspecified organism (HCC) 12/15/2021   Formatting of this note might be different from the original.  Last Assessment & Plan: Formatting of this note might be different from the original. Appears to be multifactorial and secondary to healthcare associated pneumonia as well as a UTI CT angio shows asymmetric airspace disease at the left base and posteriorly in the right upper lobe concerning for pneumonia Patient also has pyuria Patient   Severe sepsis (HCC) 12/15/2021   Skin cancer    nose   Swelling    Tremor    Tremors of nervous system    Ureteral stone with hydronephrosis 11/26/2018   Past Surgical History:  Procedure Laterality Date   APPENDECTOMY  1962   BREAST BIOPSY Right 2006?   benign   BREAST BIOPSY Left 2019   invasive mammary carcinoma   BREAST CYST EXCISION Left 07/27/2017    Procedure: SKIN CYST EXCISED;  Surgeon: Earline Mayotte, MD;  Location: [facility] ORS;  Service: General;  Laterality: Left;   BREAST LUMPECTOMY Left 07/2017   invasive mammary, DCIS  mammosite   BREAST SURGERY Right 2019   CARPAL TUNNEL RELEASE Right    CATARACT EXTRACTION W/PHACO Left 11/11/2016   Procedure: CATARACT EXTRACTION PHACO AND INTRAOCULAR LENS PLACEMENT (IOC);  Surgeon: Galen Manila, MD;  Location: [facility] ORS;  Service: Ophthalmology;  Laterality: Left;  [place] 01:07.9 AP% 19.2 CDE 13.02 Fluid pack lot # [id number]   CATARACT EXTRACTION W/PHACO Right 12/09/2016   Procedure: CATARACT EXTRACTION PHACO AND INTRAOCULAR LENS PLACEMENT (IOC);  Surgeon: Galen Manila, MD;  Location: [facility] ORS;  Service: Ophthalmology;  Laterality: Right;  [place] 00:49 AP% 21.2 CDE 10.39 Fluid pack lot # [id number]   CHOLECYSTECTOMY     COLONOSCOPY WITH PROPOFOL N/A 11/22/2014   Procedure: COLONOSCOPY WITH PROPOFOL;  Surgeon: Scot Jun, MD;  Location: [facility] ENDOSCOPY;  Service: Endoscopy;  Laterality: N/A;   CYSTOSCOPY WITH STENT PLACEMENT Bilateral 11/27/2018   Procedure: CYSTOSCOPY WITH STENT PLACEMENT;  Surgeon: Jerilee Field, MD;  Location: [facility] ORS;  Service: Urology;  Laterality: Bilateral;   CYSTOSCOPY/URETEROSCOPY/HOLMIUM LASER/STENT PLACEMENT Bilateral 12/17/2018   Procedure: CYSTOSCOPY/URETEROSCOPY/HOLMIUM LASER/STENT Exchange;  Surgeon: Sondra Come, MD;  Location: [facility] ORS;  Service: Urology;  Laterality: Bilateral;   ESOPHAGOGASTRODUODENOSCOPY  11/22/2014   Procedure: ESOPHAGOGASTRODUODENOSCOPY (EGD);  Surgeon: Scot Jun, MD;  Location: [facility] ENDOSCOPY;  Service: Endoscopy;;   ESOPHAGOGASTRODUODENOSCOPY (EGD) WITH PROPOFOL N/A 03/05/2017   Procedure: ESOPHAGOGASTRODUODENOSCOPY (EGD) WITH PROPOFOL;  Surgeon: Toney Reil, MD;  Location: [facility] ENDOSCOPY;  Service: Gastroenterology;  Laterality: N/A;   EXTRACORPOREAL SHOCK WAVE LITHOTRIPSY Right 03/04/2018   Procedure:  EXTRACORPOREAL SHOCK WAVE LITHOTRIPSY (ESWL);  Surgeon: Sondra Come, MD;  Location: [facility] ORS;  Service: Urology;  Laterality: Right;   EYE SURGERY Bilateral 2012   cataract extraction with iol   GALLBLADDER SURGERY  1968   hemi pelvectomy     left side at age 25   HEMIPELVIC Left 1962   LEG SURGERY Left    AMPUTATION d/t cancer at 89 years old   MASTECTOMY, PARTIAL Left 07/27/2017   Procedure: MASTECTOMY PARTIAL;  Surgeon: Earline Mayotte, MD;  Location: [facility] ORS;  Service: General;  Laterality: Left;   MOUTH SURGERY  2019   teeth pulled with a bridge insertion.   fell out 12/16/18   OVARY SURGERY Right    cyst removed   SAVORY DILATION  11/22/2014   Procedure: SAVORY DILATION;  Surgeon: Scot Jun, MD;  Location: [facility] ENDOSCOPY;  Service: Endoscopy;;   SENTINEL NODE BIOPSY Left 07/27/2017   Procedure: SENTINEL NODE BIOPSY;  Surgeon: Earline Mayotte, MD;  Location: [facility] ORS;  Service: General;  Laterality: Left;   SKIN CANCER EXCISION     nose and arm and face   TEE WITHOUT CARDIOVERSION N/A 07/25/2019   Procedure: TRANSESOPHAGEAL ECHOCARDIOGRAM (TEE);  Surgeon: Lamar Blinks, MD;  Location: [facility] ORS;  Service: Cardiovascular;  Laterality: N/A;   TONSILLECTOMY      reports that she has never smoked. She has never used smokeless tobacco. She reports that she does not drink alcohol and does not use drugs. Social History   Socioeconomic History   Marital status: Divorced    Spouse name: Not on file   Number of children: Not on file   Years of education: Not on file   Highest education level: Not on file  Occupational History   Occupation: Runner, broadcasting/film/video    Comment: retired  Tobacco Use   Smoking status: Never   Smokeless tobacco: Never  Vaping Use   Vaping status: Never Used  Substance and Sexual Activity   Alcohol use: No   Drug use: No   Sexual activity: Not Currently  Other Topics Concern   Not on file  Social History Narrative   Patient lives at [facility]   Social Determinants of Health   Financial Resource Strain: Low Risk  (02/16/2023)   Received from [facility]   Overall Financial Resource Strain (CARDIA)    Difficulty of Paying Living Expenses: Not hard at all  Food Insecurity: No Food Insecurity (03/20/2023)   Hunger Vital Sign    Worried About Running Out of Food in the Last Year: Never true    Ran Out of Food in the Last Year: Never true  Transportation Needs: No Transportation Needs (03/20/2023)   PRAPARE - Administrator, Civil Service (Medical): No    Lack of Transportation (Non-Medical): No  Physical Activity: Not on file  Stress: Not on file  Social Connections: Not on file  Intimate Partner Violence: Not At Risk (03/20/2023)   Humiliation, Afraid, Rape, and Kick questionnaire    Fear of Current or Ex-Partner: No    Emotionally Abused: No    Physically Abused: No    Sexually Abused: No    Functional Status Survey:    Family History  Problem Relation Age of Onset   Breast cancer Paternal Aunt    Ovarian cancer Sister    Prostate cancer Father    Stroke Mother    Kidney cancer Neg Hx    Bladder Cancer Neg Hx     Health Maintenance  Topic Date Due   Zoster Vaccines- Shingrix (1 of 2) 11/02/2026 (Originally 04/30/1953)   HEMOGLOBIN A1C  09/17/2023   OPHTHALMOLOGY EXAM  10/30/2023   FOOT EXAM  12/11/2023   Medicare Annual Wellness (AWV)  12/11/2023   DTaP/Tdap/Td (2 - Td or Tdap) 12/30/2032   Pneumonia Vaccine 68+ Years old  Completed   INFLUENZA VACCINE  Completed   DEXA SCAN  Completed   HPV VACCINES  Aged Out   COVID-19 Vaccine  Discontinued    Allergies  Allergen Reactions   Ciprofloxacin Hives   Codeine Other (See Comments)    HALLUCINATIONS   Tape     Rash and skin irritation/ paper tape and tegaderm OK   Ciprocinonide [Fluocinolone] Other (See Comments)    unknown   Amoxicillin Other (See Comments)    Upset stomach Has patient had a  PCN reaction causing immediate rash, facial/tongue/throat swelling, SOB or lightheadedness with hypotension: No Has patient  had a PCN reaction causing severe rash involving mucus membranes or skin necrosis: No Has patient had a PCN reaction that required hospitalization: No Has patient had a PCN reaction occurring within the last 10 years: Yes If all of the above answers are "NO", then may proceed with Cephalosporin use.;   Cefuroxime Other (See Comments)    OK INTRACAMERALLY PER DR [facility], upset stomach   Latex Rash    Rast test NEGATIVE   Lipitor [Atorvastatin] Other (See Comments)    unknown    Outpatient Encounter Medications as of 04/01/2023  Medication Sig   acetaminophen (TYLENOL) 650 MG CR tablet Take 650 mg by mouth every 8 (eight) hours as needed for pain. Give one tablet by mouth three times daily.   Cholecalciferol (VITAMIN D-1000 MAX ST) 25 MCG (1000 UT) tablet Take by mouth.   dextromethorphan-guaiFENesin (ROBITUSSIN-DM) 10-100 MG/5ML liquid Take 10 mLs by mouth every 4 (four) hours as needed for cough.   diclofenac Sodium (VOLTAREN) 1 % GEL Apply 4 g topically. Apply to stump topically every 6 hours as needed. Apply to right knee topically three times daily for pain.   fluticasone (FLONASE) 50 MCG/ACT nasal spray Place 2 sprays into both nostrils 2 (two) times daily.   gabapentin (NEURONTIN) 100 MG capsule Take 100 mg by mouth 3 (three) times daily.   GLIPIZIDE XL 2.5 MG 24 hr tablet Take 2.5 mg by mouth daily.    hydrocortisone 2.5 % cream Apply to Hemorrhoids/buttucks topically every 8 hours as needed for hemorrhoids.   hydrocortisone cream 1 % Apply to rectum topically every 6 hours as needed   ipratropium-albuterol (DUONEB) 0.5-2.5 (3) MG/3ML SOLN Take 3 mLs by nebulization every 4 (four) hours as needed.   loratadine (CLARITIN) 10 MG tablet Take 10 mg by mouth daily.   Magnesium Oxide 400 MG CAPS Take 1 capsule (400 mg total) by mouth 2 (two) times daily for 7 days.    melatonin 3 MG TABS tablet Take 3 mg by mouth at bedtime.   metFORMIN (GLUCOPHAGE) 500 MG tablet Take 750 mg by mouth 2 (two) times daily with a meal.   methimazole (TAPAZOLE) 10 MG tablet Take 1 tablet by mouth daily.   metoprolol tartrate (LOPRESSOR) 50 MG tablet Take 50 mg by mouth 2 (two) times daily.   nystatin cream (MYCOSTATIN) Apply 1 Application topically as directed.  Apply to abdominal folds topically every day and evening shift for Fungal Rash,  Apply to axilla bilateral topically every day and evening shift for Fungal Rash, Apply to groin topically as needed for rash, lastly Apply to groin topically every day and evening shift for Fungal Rash. Total of 4 different sets of instructions per [facility]   ondansetron (ZOFRAN) 4 MG tablet Take 4 mg by mouth every 4 (four) hours as needed for nausea or vomiting.   OXYGEN 2lpm as needed for sats less than 88% every 1 hour as needed   pantoprazole (PROTONIX) 40 MG tablet Take 40 mg by mouth daily.   PARoxetine (PAXIL) 30 MG tablet Take 30 mg by mouth daily.   Polyethyl Glycol-Propyl Glycol 0.4-0.3 % SOLN Apply to eye 2 (two) times daily as needed.   polyethylene glycol (MIRALAX / GLYCOLAX) 17 g packet Take 17 g by mouth daily as needed.   PRADAXA 150 MG CAPS capsule TAKE 1 CAPSULE BY MOUTH TWICE DAILY START TREATMENT 10 HRS LAST DOSE OF LOVENOX   saccharomyces boulardii (FLORASTOR) 250 MG capsule Take 250 mg by mouth daily.  SUNSCREEN SPF50 EX Apply topically. Apply to exposed skin topically every 2 hours as needed for Sunburn Prevention   tiZANidine (ZANAFLEX) 2 MG tablet Take 2 mg by mouth every 12 (twelve) hours as needed for muscle spasms.   triamcinolone cream (KENALOG) 0.1 % Apply 1 Application topically as directed.  Apply to Back topically every day and evening shift for Rash/Itching   Zinc Oxide (TRIPLE PASTE) 12.8 % ointment Apply to sacrum/buttocks topically every shift. (Patient not taking: Reported on 03/31/2023)   No  facility-administered encounter medications on file as of 04/01/2023.    Review of Systems  Vitals:   04/01/23 1000 04/01/23 1001  BP: (!) 152/73 (!) 158/78  Pulse: 83   Resp: 18   Temp: (!) 97.2 F (36.2 C)   SpO2: 95%   Weight: 155 lb 6.4 oz (70.5 kg)   Height: 5\' 3"  (1.6 m)    Body mass index is 27.53 kg/m. Physical Exam Constitutional:      Appearance: Normal appearance.  Cardiovascular:     Rate and Rhythm: Normal rate.     Pulses: Normal pulses.  Pulmonary:     Effort: Pulmonary effort is normal.  Abdominal:     General: Abdomen is flat.     Palpations: Abdomen is soft.  Musculoskeletal:     Comments: LLE amputation  Neurological:     General: No focal deficit present.     Mental Status: She is alert.     Labs reviewed: Basic Metabolic Panel: Recent Labs    03/28/23 0426 03/29/23 0414 03/30/23 0642  NA 138 138 137  K 4.2 4.4 5.5*  CL 101 103 103  CO2 29 27 25   GLUCOSE 177* 173* 202*  BUN 11 10 8   CREATININE 0.49 0.50 0.62  CALCIUM 8.7* 9.1 9.3  MG 1.5* 1.9 1.7  PHOS 2.5 1.9* 2.4*   Liver Function Tests: Recent Labs    03/21/23 0502 03/22/23 0500 03/23/23 0507  AST 18 15 22   ALT 10 12 14   ALKPHOS 45 46 53  BILITOT 0.4 <0.2 0.4  PROT 5.7* 5.9* 6.3*  ALBUMIN 2.8* 2.7* 2.8*   No results for input(s): "LIPASE", "AMYLASE" in the last 8760 hours. Recent Labs    03/20/23 1034  AMMONIA 31   CBC: Recent Labs    02/03/23 1004 03/09/23 0000 03/19/23 1849 03/21/23 0502 03/27/23 0545 03/28/23 0426 03/29/23 0414  WBC 8.6 7.8 13.1*   < > 11.5* 11.0* 10.1  NEUTROABS 5.5 4,984.00 9.8*  --   --   --   --   HGB 13.4 11.8* 12.6   < > 11.2* 10.6* 10.9*  HCT 42.3 37 40.5   < > 35.5* 34.1* 35.1*  MCV 92.0  --  91.4   < > 91.0 90.9 92.9  PLT 174 180 244   < > 272 278 312   < > = values in this interval not displayed.   Cardiac Enzymes: Recent Labs    03/20/23 1244 03/21/23 0502 03/23/23 0507  CKTOTAL 327* 223 68   BNP: Invalid  input(s): "POCBNP" Lab Results  Component Value Date   HGBA1C 6.5 (H) 03/20/2023   Lab Results  Component Value Date   TSH 0.307 (L) 03/20/2023   Lab Results  Component Value Date   VITAMINB12 326 03/20/2023   Lab Results  Component Value Date   FOLATE 12.1 03/21/2023   No results found for: "IRON", "TIBC", "FERRITIN"  Imaging and Procedures obtained prior to SNF admission: CT Angio Chest PE  W/Cm &/Or Wo Cm  Result Date: 03/20/2023 CLINICAL DATA:  89 year old female with confusion presenting with altered mental status and hypoxia. PE suspected. EXAM: CT ANGIOGRAPHY CHEST WITH CONTRAST TECHNIQUE: Multidetector CT imaging of the chest was performed using the standard protocol during bolus administration of intravenous contrast. Multiplanar CT image reconstructions and MIPs were obtained to evaluate the vascular anatomy. RADIATION DOSE REDUCTION: This exam was performed according to the departmental dose-optimization program which includes automated exposure control, adjustment of the mA and/or kV according to patient size and/or use of iterative reconstruction technique. CONTRAST:  OMNIPAQUE IOHEXOL 350 MG/ML SOLN COMPARISON:  Chest radiograph earlier today and CT 06/27/2022 FINDINGS: Cardiovascular: Cardiomegaly. No pericardial effusion. Mitral annular calcification. Aortic and coronary artery calcification. Negative for acute pulmonary embolism. Mediastinum/Nodes: Trachea and esophagus are unremarkable. No thoracic adenopathy. Lungs/Pleura: Similar atelectasis or consolidation in the left lower lobe. Expiratory phase exam. Mosaic attenuation of the lungs favors air trapping though a component of pulmonary edema is not excluded. Redemonstrated pulmonary nodules are unchanged measuring up to 7 mm in the right lower lobe (series 7/image 65) and 6 mm in the right middle lobe (7/68). No pleural effusion or pneumothorax. Upper Abdomen: No acute abnormality. Musculoskeletal: No acute fracture.  Review of the MIP images confirms the above findings. IMPRESSION: 1. Negative for acute pulmonary embolism. 2. Similar atelectasis or consolidation in the left lower lobe. 3. Mosaic attenuation of the lungs favors air trapping from bronchiolitis though a component of pulmonary edema is not excluded. 4. Unchanged pulmonary nodules measuring up to 7 mm in the right lower lobe. Aortic Atherosclerosis (ICD10-I70.0). Electronically Signed   By: Minerva Fester M.D.   On: 03/20/2023 00:46   CT HEAD WO CONTRAST  Result Date: 03/20/2023 CLINICAL DATA:  Mental status change, unknown cause EXAM: CT HEAD WITHOUT CONTRAST TECHNIQUE: Contiguous axial images were obtained from the base of the skull through the vertex without intravenous contrast. RADIATION DOSE REDUCTION: This exam was performed according to the departmental dose-optimization program which includes automated exposure control, adjustment of the mA and/or kV according to patient size and/or use of iterative reconstruction technique. COMPARISON:  CT head 01/10/2021 FINDINGS: Brain: Patchy and confluent areas of decreased attenuation are noted throughout the deep and periventricular white matter of the cerebral hemispheres bilaterally, compatible with chronic microvascular ischemic disease. No evidence of large-territorial acute infarction. No parenchymal hemorrhage. No mass lesion. No extra-axial collection. No mass effect or midline shift. No hydrocephalus. Basilar cisterns are patent. Vascular: No hyperdense vessel. Skull: No acute fracture or focal lesion. Sinuses/Orbits: Paranasal sinuses and mastoid air cells are clear. Bilateral lens replacement. Otherwise the orbits are unremarkable. Other: None. IMPRESSION: No acute intracranial abnormality. Electronically Signed   By: Tish Frederickson M.D.   On: 03/20/2023 00:40   DG Chest 1 View  Result Date: 03/19/2023 CLINICAL DATA:  Altered mental status. EXAM: CHEST  1 VIEW COMPARISON:  December 15, 2021  FINDINGS: There is stable mild to moderate severity cardiac silhouette enlargement. Marked severity calcification of the aortic arch is seen. Low lung volumes are noted with mild, diffuse, chronic appearing increased interstitial lung markings. Mild areas of scarring and/or atelectasis are seen within the bilateral lung bases. There is a small left pleural effusion. No pneumothorax is identified. No acute osseous abnormalities are identified. IMPRESSION: 1. Cardiomegaly and low lung volumes with mild bibasilar scarring and/or atelectasis. 2. Small left pleural effusion. Electronically Signed   By: Aram Candela M.D.   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 Continued intermittent use of supplemental oxygen. Goal >90%. Wean O2 as tolerated. Pradaxa for Factor V leiden, continue at this time. Hematuria multifactorial due to pradaxa and renal stones, continue to monitor. Continue glipizide and metformin for DM. Continue zanaflex pain in the right leg. Mood stable with Paxil, continue decrease as tolerate. BP slightly elevated today, continue daily BP check to determine need for additional medications. Continue metoprolol for rate control.   Family/ staff Communication:   Labs/tests ordered:

## 2023-04-02 ENCOUNTER — Encounter: Payer: Self-pay | Admitting: Student

## 2023-04-06 DIAGNOSIS — R319 Hematuria, unspecified: Secondary | ICD-10-CM | POA: Diagnosis not present

## 2023-04-06 DIAGNOSIS — I739 Peripheral vascular disease, unspecified: Secondary | ICD-10-CM | POA: Diagnosis not present

## 2023-04-06 DIAGNOSIS — R278 Other lack of coordination: Secondary | ICD-10-CM | POA: Diagnosis not present

## 2023-04-06 DIAGNOSIS — N390 Urinary tract infection, site not specified: Secondary | ICD-10-CM | POA: Diagnosis not present

## 2023-04-06 DIAGNOSIS — Z51A Encounter for sepsis aftercare: Secondary | ICD-10-CM | POA: Diagnosis not present

## 2023-04-06 DIAGNOSIS — E1142 Type 2 diabetes mellitus with diabetic polyneuropathy: Secondary | ICD-10-CM | POA: Diagnosis not present

## 2023-04-06 DIAGNOSIS — J9601 Acute respiratory failure with hypoxia: Secondary | ICD-10-CM | POA: Diagnosis not present

## 2023-04-06 DIAGNOSIS — M6281 Muscle weakness (generalized): Secondary | ICD-10-CM | POA: Diagnosis not present

## 2023-04-06 LAB — COMPREHENSIVE METABOLIC PANEL
Calcium: 10 (ref 8.7–10.7)
eGFR: 87

## 2023-04-06 LAB — BASIC METABOLIC PANEL
BUN: 16 (ref 4–21)
CO2: 29 — AB (ref 13–22)
Chloride: 101 (ref 99–108)
Creatinine: 0.6 (ref 0.5–1.1)
Glucose: 91
Potassium: 5.4 meq/L — AB (ref 3.5–5.1)
Sodium: 138 (ref 137–147)

## 2023-04-06 LAB — CBC AND DIFFERENTIAL
HCT: 36 (ref 36–46)
Hemoglobin: 11.3 — AB (ref 12.0–16.0)
Neutrophils Absolute: 7025
Platelets: 264 10*3/uL (ref 150–400)
WBC: 10.3

## 2023-04-06 LAB — CBC: RBC: 4.08 (ref 3.87–5.11)

## 2023-04-07 DIAGNOSIS — E1142 Type 2 diabetes mellitus with diabetic polyneuropathy: Secondary | ICD-10-CM | POA: Diagnosis not present

## 2023-04-07 DIAGNOSIS — Z51A Encounter for sepsis aftercare: Secondary | ICD-10-CM | POA: Diagnosis not present

## 2023-04-07 DIAGNOSIS — R278 Other lack of coordination: Secondary | ICD-10-CM | POA: Diagnosis not present

## 2023-04-07 DIAGNOSIS — N390 Urinary tract infection, site not specified: Secondary | ICD-10-CM | POA: Diagnosis not present

## 2023-04-07 DIAGNOSIS — I739 Peripheral vascular disease, unspecified: Secondary | ICD-10-CM | POA: Diagnosis not present

## 2023-04-07 DIAGNOSIS — J9601 Acute respiratory failure with hypoxia: Secondary | ICD-10-CM | POA: Diagnosis not present

## 2023-04-07 DIAGNOSIS — M6281 Muscle weakness (generalized): Secondary | ICD-10-CM | POA: Diagnosis not present

## 2023-04-08 ENCOUNTER — Encounter: Payer: Self-pay | Admitting: Student

## 2023-04-08 DIAGNOSIS — J9601 Acute respiratory failure with hypoxia: Secondary | ICD-10-CM | POA: Diagnosis not present

## 2023-04-08 DIAGNOSIS — I739 Peripheral vascular disease, unspecified: Secondary | ICD-10-CM | POA: Diagnosis not present

## 2023-04-08 DIAGNOSIS — R278 Other lack of coordination: Secondary | ICD-10-CM | POA: Diagnosis not present

## 2023-04-08 DIAGNOSIS — M6281 Muscle weakness (generalized): Secondary | ICD-10-CM | POA: Diagnosis not present

## 2023-04-08 DIAGNOSIS — Z51A Encounter for sepsis aftercare: Secondary | ICD-10-CM | POA: Diagnosis not present

## 2023-04-08 DIAGNOSIS — N390 Urinary tract infection, site not specified: Secondary | ICD-10-CM | POA: Diagnosis not present

## 2023-04-08 DIAGNOSIS — E1142 Type 2 diabetes mellitus with diabetic polyneuropathy: Secondary | ICD-10-CM | POA: Diagnosis not present

## 2023-04-09 DIAGNOSIS — R278 Other lack of coordination: Secondary | ICD-10-CM | POA: Diagnosis not present

## 2023-04-09 DIAGNOSIS — N390 Urinary tract infection, site not specified: Secondary | ICD-10-CM | POA: Diagnosis not present

## 2023-04-09 DIAGNOSIS — M6281 Muscle weakness (generalized): Secondary | ICD-10-CM | POA: Diagnosis not present

## 2023-04-09 DIAGNOSIS — J9601 Acute respiratory failure with hypoxia: Secondary | ICD-10-CM | POA: Diagnosis not present

## 2023-04-09 DIAGNOSIS — Z51A Encounter for sepsis aftercare: Secondary | ICD-10-CM | POA: Diagnosis not present

## 2023-04-09 DIAGNOSIS — E1142 Type 2 diabetes mellitus with diabetic polyneuropathy: Secondary | ICD-10-CM | POA: Diagnosis not present

## 2023-04-09 DIAGNOSIS — I739 Peripheral vascular disease, unspecified: Secondary | ICD-10-CM | POA: Diagnosis not present

## 2023-04-10 DIAGNOSIS — M6281 Muscle weakness (generalized): Secondary | ICD-10-CM | POA: Diagnosis not present

## 2023-04-10 DIAGNOSIS — E1142 Type 2 diabetes mellitus with diabetic polyneuropathy: Secondary | ICD-10-CM | POA: Diagnosis not present

## 2023-04-10 DIAGNOSIS — I739 Peripheral vascular disease, unspecified: Secondary | ICD-10-CM | POA: Diagnosis not present

## 2023-04-10 DIAGNOSIS — R278 Other lack of coordination: Secondary | ICD-10-CM | POA: Diagnosis not present

## 2023-04-10 DIAGNOSIS — N390 Urinary tract infection, site not specified: Secondary | ICD-10-CM | POA: Diagnosis not present

## 2023-04-10 DIAGNOSIS — Z51A Encounter for sepsis aftercare: Secondary | ICD-10-CM | POA: Diagnosis not present

## 2023-04-10 DIAGNOSIS — J9601 Acute respiratory failure with hypoxia: Secondary | ICD-10-CM | POA: Diagnosis not present

## 2023-04-11 DIAGNOSIS — E1142 Type 2 diabetes mellitus with diabetic polyneuropathy: Secondary | ICD-10-CM | POA: Diagnosis not present

## 2023-04-11 DIAGNOSIS — R278 Other lack of coordination: Secondary | ICD-10-CM | POA: Diagnosis not present

## 2023-04-11 DIAGNOSIS — N390 Urinary tract infection, site not specified: Secondary | ICD-10-CM | POA: Diagnosis not present

## 2023-04-11 DIAGNOSIS — M6281 Muscle weakness (generalized): Secondary | ICD-10-CM | POA: Diagnosis not present

## 2023-04-11 DIAGNOSIS — J9601 Acute respiratory failure with hypoxia: Secondary | ICD-10-CM | POA: Diagnosis not present

## 2023-04-11 DIAGNOSIS — Z51A Encounter for sepsis aftercare: Secondary | ICD-10-CM | POA: Diagnosis not present

## 2023-04-11 DIAGNOSIS — I739 Peripheral vascular disease, unspecified: Secondary | ICD-10-CM | POA: Diagnosis not present

## 2023-04-13 DIAGNOSIS — E0510 Thyrotoxicosis with toxic single thyroid nodule without thyrotoxic crisis or storm: Secondary | ICD-10-CM | POA: Diagnosis not present

## 2023-04-13 DIAGNOSIS — E0590 Thyrotoxicosis, unspecified without thyrotoxic crisis or storm: Secondary | ICD-10-CM | POA: Diagnosis not present

## 2023-04-13 DIAGNOSIS — E875 Hyperkalemia: Secondary | ICD-10-CM | POA: Diagnosis not present

## 2023-04-13 DIAGNOSIS — E210 Primary hyperparathyroidism: Secondary | ICD-10-CM | POA: Diagnosis not present

## 2023-04-14 ENCOUNTER — Ambulatory Visit
Admission: RE | Admit: 2023-04-14 | Discharge: 2023-04-14 | Disposition: A | Discharge status: Home / Self Care | Payer: Medicare Other | Source: Ambulatory Visit | Attending: Nurse Practitioner | Admitting: Nurse Practitioner

## 2023-04-14 ENCOUNTER — Ambulatory Visit
Admission: RE | Admit: 2023-04-14 | Discharge: 2023-04-14 | Disposition: A | Discharge status: Home / Self Care | Payer: Medicare Other | Source: Ambulatory Visit | Attending: Oncology | Admitting: Oncology

## 2023-04-14 DIAGNOSIS — M810 Age-related osteoporosis without current pathological fracture: Secondary | ICD-10-CM | POA: Diagnosis not present

## 2023-04-14 DIAGNOSIS — Z51A Encounter for sepsis aftercare: Secondary | ICD-10-CM | POA: Diagnosis not present

## 2023-04-14 DIAGNOSIS — I739 Peripheral vascular disease, unspecified: Secondary | ICD-10-CM | POA: Diagnosis not present

## 2023-04-14 DIAGNOSIS — E1142 Type 2 diabetes mellitus with diabetic polyneuropathy: Secondary | ICD-10-CM | POA: Diagnosis not present

## 2023-04-14 DIAGNOSIS — N390 Urinary tract infection, site not specified: Secondary | ICD-10-CM | POA: Diagnosis not present

## 2023-04-14 DIAGNOSIS — R278 Other lack of coordination: Secondary | ICD-10-CM | POA: Diagnosis not present

## 2023-04-14 DIAGNOSIS — Z1231 Encounter for screening mammogram for malignant neoplasm of breast: Secondary | ICD-10-CM | POA: Insufficient documentation

## 2023-04-14 DIAGNOSIS — Z853 Personal history of malignant neoplasm of breast: Secondary | ICD-10-CM | POA: Insufficient documentation

## 2023-04-14 DIAGNOSIS — M6281 Muscle weakness (generalized): Secondary | ICD-10-CM | POA: Diagnosis not present

## 2023-04-14 DIAGNOSIS — Z08 Encounter for follow-up examination after completed treatment for malignant neoplasm: Secondary | ICD-10-CM

## 2023-04-14 DIAGNOSIS — J9601 Acute respiratory failure with hypoxia: Secondary | ICD-10-CM | POA: Diagnosis not present

## 2023-04-14 DIAGNOSIS — Z780 Asymptomatic menopausal state: Secondary | ICD-10-CM | POA: Diagnosis not present

## 2023-04-16 DIAGNOSIS — Z51A Encounter for sepsis aftercare: Secondary | ICD-10-CM | POA: Diagnosis not present

## 2023-04-16 DIAGNOSIS — N390 Urinary tract infection, site not specified: Secondary | ICD-10-CM | POA: Diagnosis not present

## 2023-04-16 DIAGNOSIS — I739 Peripheral vascular disease, unspecified: Secondary | ICD-10-CM | POA: Diagnosis not present

## 2023-04-16 DIAGNOSIS — E1142 Type 2 diabetes mellitus with diabetic polyneuropathy: Secondary | ICD-10-CM | POA: Diagnosis not present

## 2023-04-16 DIAGNOSIS — J9601 Acute respiratory failure with hypoxia: Secondary | ICD-10-CM | POA: Diagnosis not present

## 2023-04-16 DIAGNOSIS — Z79899 Other long term (current) drug therapy: Secondary | ICD-10-CM | POA: Diagnosis not present

## 2023-04-16 DIAGNOSIS — R278 Other lack of coordination: Secondary | ICD-10-CM | POA: Diagnosis not present

## 2023-04-16 DIAGNOSIS — M6281 Muscle weakness (generalized): Secondary | ICD-10-CM | POA: Diagnosis not present

## 2023-04-16 LAB — TSH: TSH: 3.77 (ref 0.41–5.90)

## 2023-04-17 DIAGNOSIS — R278 Other lack of coordination: Secondary | ICD-10-CM | POA: Diagnosis not present

## 2023-04-17 DIAGNOSIS — E1142 Type 2 diabetes mellitus with diabetic polyneuropathy: Secondary | ICD-10-CM | POA: Diagnosis not present

## 2023-04-17 DIAGNOSIS — J9601 Acute respiratory failure with hypoxia: Secondary | ICD-10-CM | POA: Diagnosis not present

## 2023-04-17 DIAGNOSIS — Z51A Encounter for sepsis aftercare: Secondary | ICD-10-CM | POA: Diagnosis not present

## 2023-04-17 DIAGNOSIS — M6281 Muscle weakness (generalized): Secondary | ICD-10-CM | POA: Diagnosis not present

## 2023-04-17 DIAGNOSIS — I739 Peripheral vascular disease, unspecified: Secondary | ICD-10-CM | POA: Diagnosis not present

## 2023-04-17 DIAGNOSIS — N390 Urinary tract infection, site not specified: Secondary | ICD-10-CM | POA: Diagnosis not present

## 2023-04-22 ENCOUNTER — Encounter: Payer: Self-pay | Admitting: Student

## 2023-04-22 ENCOUNTER — Non-Acute Institutional Stay (SKILLED_NURSING_FACILITY): Payer: Medicare Other | Admitting: Student

## 2023-04-22 DIAGNOSIS — M810 Age-related osteoporosis without current pathological fracture: Secondary | ICD-10-CM | POA: Diagnosis not present

## 2023-04-22 DIAGNOSIS — G9341 Metabolic encephalopathy: Secondary | ICD-10-CM

## 2023-04-22 DIAGNOSIS — I739 Peripheral vascular disease, unspecified: Secondary | ICD-10-CM | POA: Diagnosis not present

## 2023-04-22 DIAGNOSIS — Z51A Encounter for sepsis aftercare: Secondary | ICD-10-CM | POA: Diagnosis not present

## 2023-04-22 DIAGNOSIS — E1142 Type 2 diabetes mellitus with diabetic polyneuropathy: Secondary | ICD-10-CM

## 2023-04-22 DIAGNOSIS — S88912S Complete traumatic amputation of left lower leg, level unspecified, sequela: Secondary | ICD-10-CM

## 2023-04-22 DIAGNOSIS — I10 Essential (primary) hypertension: Secondary | ICD-10-CM | POA: Diagnosis not present

## 2023-04-22 DIAGNOSIS — M6281 Muscle weakness (generalized): Secondary | ICD-10-CM | POA: Diagnosis not present

## 2023-04-22 DIAGNOSIS — N390 Urinary tract infection, site not specified: Secondary | ICD-10-CM | POA: Diagnosis not present

## 2023-04-22 DIAGNOSIS — R278 Other lack of coordination: Secondary | ICD-10-CM | POA: Diagnosis not present

## 2023-04-22 DIAGNOSIS — J9601 Acute respiratory failure with hypoxia: Secondary | ICD-10-CM | POA: Diagnosis not present

## 2023-04-22 DIAGNOSIS — I480 Paroxysmal atrial fibrillation: Secondary | ICD-10-CM | POA: Diagnosis not present

## 2023-04-22 NOTE — Progress Notes (Signed)
Location:  Other [place].  Nursing Home Room Number: [facility] [address] Place of Service:  SNF [phone]) Provider:  Earnestine Mealing, MD  Patient Care Team: Earnestine Mealing, MD as PCP - General (Family Medicine) Lemar Livings Merrily Pew, MD (General Surgery) Jeralyn Ruths, MD as Consulting Physician (Oncology)  Extended Emergency Contact Information Primary Emergency Contact: [name] Address: [address]          [place], [place] [address] [place] of [place] Home Phone: [phone] Mobile Phone: [phone] Relation: Daughter  Code Status:  DNR Goals of care: Advanced Directive information    04/22/2023    9:53 AM  Advanced Directives  Does Patient Have a Medical Advance Directive? Yes  Type of Advance Directive Out of facility DNR (pink MOST or yellow form)  Does patient want to make changes to medical advance directive? No - Patient declined     Chief Complaint  Patient presents with   Medical Management of Chronic Issues    Medical Management of Chronic Issues.     HPI:  Pt is a 89 y.o. female seen today for medical management of chronic diseases.   She is feeling much better. Continued memory loss regarding her hospitalization, but starting to feel like her normal self. She's states she has been eating well and having normal bowel movements. Denies diarrhea or abdominal pain at this time.   Nursing states she seems to be like her "normal self' again.   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  Oncotype recurrence score: 1.  DCIS, margin less than 0.5 mm.  Negative sentinel node.  Accelerated partial breast radiation.   Cervicalgia    Chronic kidney disease    kidney  stones   Colon cancer [facility])    patient unaware of this   Complication of anesthesia    mood alteration / not sure if d/t pain medicine or anesthesia as she passed out    Cough    NASAL DRIP / SNEEZING / SORE THROAT MOSTLY CONSTANT   Depression    Diabetes (HCC)    Diverticulitis    Dizzy    GERD (gastroesophageal reflux disease)    H/O blood clots    arm and leg   HBP (high blood pressure)    Hematuria    gross   Hemorrhoids    HLD (hyperlipidemia)    Microscopic hematuria 06/02/2015   Muscle pain    Osteoarthritis    Osteoporosis    Panic disorder    Personal history of radiation therapy    PND (post-nasal drip)    CHRONIC WITH SORE THROAT AND SNEEZING   Reflux    Sepsis (HCC)    Sepsis, unspecified organism (HCC) 12/15/2021   Formatting of this note might be different from the original.  Last Assessment & Plan: Formatting of this note might be different from the original. Appears to be multifactorial and secondary to healthcare associated pneumonia as well as a UTI CT angio shows asymmetric airspace disease at the left base and posteriorly in the right upper lobe concerning for pneumonia Patient also has pyuria Patient   Severe sepsis (HCC) 12/15/2021   Skin  cancer    nose   Swelling    Tremor    Tremors of nervous system    Ureteral stone with hydronephrosis 11/26/2018   Past Surgical History:  Procedure Laterality Date   APPENDECTOMY  1962   BREAST BIOPSY Right 2006?   benign   BREAST BIOPSY Left 2019   invasive mammary carcinoma   BREAST CYST EXCISION Left 07/27/2017   Procedure: SKIN CYST EXCISED;  Surgeon: Earline Mayotte, MD;  Location: [facility] ORS;  Service: General;  Laterality: Left;   BREAST LUMPECTOMY Left 07/2017   invasive mammary, DCIS  mammosite   BREAST SURGERY Right 2019   CARPAL TUNNEL RELEASE Right    CATARACT EXTRACTION W/PHACO Left 11/11/2016   Procedure: CATARACT EXTRACTION PHACO AND INTRAOCULAR LENS PLACEMENT (IOC);  Surgeon: Galen Manila, MD;  Location: [facility] ORS;  Service: Ophthalmology;  Laterality: Left;  [place] 01:07.9 AP% 19.2 CDE 13.02 Fluid pack lot # [id number]   CATARACT EXTRACTION W/PHACO Right 12/09/2016   Procedure: CATARACT EXTRACTION PHACO AND INTRAOCULAR LENS PLACEMENT (IOC);  Surgeon: Galen Manila, MD;  Location: [facility] ORS;  Service: Ophthalmology;  Laterality: Right;  [place] 00:49 AP% 21.2 CDE 10.39 Fluid pack lot # [id number]   CHOLECYSTECTOMY     COLONOSCOPY WITH PROPOFOL N/A 11/22/2014   Procedure: COLONOSCOPY WITH PROPOFOL;  Surgeon: Scot Jun, MD;  Location: [facility] ENDOSCOPY;  Service: Endoscopy;  Laterality: N/A;   CYSTOSCOPY WITH STENT PLACEMENT Bilateral 11/27/2018   Procedure: CYSTOSCOPY WITH STENT PLACEMENT;  Surgeon: Jerilee Field, MD;  Location: [facility] ORS;  Service: Urology;  Laterality: Bilateral;   CYSTOSCOPY/URETEROSCOPY/HOLMIUM LASER/STENT PLACEMENT Bilateral 12/17/2018   Procedure: CYSTOSCOPY/URETEROSCOPY/HOLMIUM LASER/STENT Exchange;  Surgeon: Sondra Come, MD;  Location: [facility] ORS;  Service: Urology;  Laterality: Bilateral;   ESOPHAGOGASTRODUODENOSCOPY  11/22/2014   Procedure: ESOPHAGOGASTRODUODENOSCOPY (EGD);  Surgeon: Scot Jun, MD;  Location: [facility] ENDOSCOPY;  Service: Endoscopy;;   ESOPHAGOGASTRODUODENOSCOPY (EGD) WITH PROPOFOL N/A 03/05/2017   Procedure: ESOPHAGOGASTRODUODENOSCOPY (EGD) WITH PROPOFOL;  Surgeon: Toney Reil, MD;  Location: [facility] ENDOSCOPY;  Service: Gastroenterology;  Laterality: N/A;   EXTRACORPOREAL SHOCK WAVE LITHOTRIPSY Right 03/04/2018   Procedure: EXTRACORPOREAL SHOCK WAVE LITHOTRIPSY (ESWL);  Surgeon: Sondra Come, MD;  Location: [facility] ORS;  Service: Urology;  Laterality: Right;   EYE SURGERY Bilateral 2012   cataract extraction with iol   GALLBLADDER SURGERY  1968   hemi pelvectomy     left side at age 68   HEMIPELVIC Left 1962   LEG SURGERY Left    AMPUTATION d/t cancer at 89 years old   MASTECTOMY, PARTIAL Left 07/27/2017   Procedure:  MASTECTOMY PARTIAL;  Surgeon: Earline Mayotte, MD;  Location: [facility] ORS;  Service: General;  Laterality: Left;   MOUTH SURGERY  2019   teeth pulled with a bridge insertion.   fell out 12/16/18   OVARY SURGERY Right    cyst removed   SAVORY DILATION  11/22/2014   Procedure: SAVORY DILATION;  Surgeon: Scot Jun, MD;  Location: [facility] ENDOSCOPY;  Service: Endoscopy;;   SENTINEL NODE BIOPSY Left 07/27/2017   Procedure: SENTINEL NODE BIOPSY;  Surgeon: Earline Mayotte, MD;  Location: [facility] ORS;  Service: General;  Laterality: Left;   SKIN CANCER EXCISION     nose and arm and face   TEE WITHOUT CARDIOVERSION N/A 07/25/2019   Procedure: TRANSESOPHAGEAL ECHOCARDIOGRAM (TEE);  Surgeon: Lamar Blinks, MD;  Location: [facility] ORS;  Service: Cardiovascular;  Laterality: N/A;   TONSILLECTOMY      Allergies  Allergen Reactions   Ciprofloxacin Hives   Codeine Other (See Comments)    HALLUCINATIONS   Tape     Rash and skin irritation/ paper tape and tegaderm OK   Ciprocinonide [Fluocinolone] Other (See Comments)    unknown   Amoxicillin Other (See Comments)    Upset stomach Has patient had a PCN reaction causing immediate rash, facial/tongue/throat swelling, SOB or lightheadedness with hypotension: No Has patient had a PCN reaction causing severe rash involving mucus membranes or skin necrosis: No Has patient had a PCN reaction that required hospitalization: No Has patient had a PCN reaction occurring within the last 10 years: Yes If all of the above answers are "NO", then may proceed with Cephalosporin use.;   Cefuroxime Other (See Comments)    OK INTRACAMERALLY PER DR WLP, upset stomach   Latex Rash    Rast test NEGATIVE   Lipitor [Atorvastatin] Other (See Comments)    unknown    Outpatient Encounter Medications as of 04/22/2023  Medication Sig   acetaminophen (TYLENOL) 650 MG CR tablet Take 650 mg by mouth every 8 (eight) hours as needed for pain. Give one tablet by mouth three times  daily.   Cholecalciferol (VITAMIN D-1000 MAX ST) 25 MCG (1000 UT) tablet Take by mouth.   dextromethorphan-guaiFENesin (ROBITUSSIN-DM) 10-100 MG/5ML liquid Take 10 mLs by mouth every 4 (four) hours as needed for cough.   diclofenac Sodium (VOLTAREN) 1 % GEL Apply 4 g topically. Apply to stump topically every 6 hours as needed. Apply to right knee topically three times daily for pain.   fluticasone (FLONASE) 50 MCG/ACT nasal spray Place 2 sprays into both nostrils 2 (two) times daily.   gabapentin (NEURONTIN) 100 MG capsule Take 100 mg by mouth 3 (three) times daily.   GLIPIZIDE XL 2.5 MG 24 hr tablet Take 2.5 mg by mouth daily.    hydrocortisone 2.5 % cream Apply to Hemorrhoids/buttucks topically every 8 hours as needed for hemorrhoids.   hydrocortisone cream 1 % Apply to rectum topically every 6 hours as needed   ipratropium-albuterol (DUONEB) 0.5-2.5 (3) MG/3ML SOLN Take 3 mLs by nebulization every 4 (four) hours as needed.   loratadine (CLARITIN) 10 MG tablet Take 10 mg by mouth daily.   melatonin 3 MG TABS tablet Take 3 mg by mouth at bedtime.   metFORMIN (GLUCOPHAGE) 500 MG tablet Take 750 mg by mouth 2 (two) times daily with a meal.   methimazole (TAPAZOLE) 10 MG tablet Take 1 tablet by mouth daily.   metoprolol tartrate (LOPRESSOR) 50 MG tablet Take 50 mg by mouth 2 (two) times daily.   nystatin cream (MYCOSTATIN) Apply 1 Application topically as directed.  Apply to abdominal folds topically every day and evening shift for Fungal Rash,  Apply to axilla bilateral topically every day and evening shift for Fungal Rash, Apply to groin topically as needed for rash, lastly Apply to groin topically every day and evening shift for Fungal Rash. Total of 4 different sets of instructions per [facility]   ondansetron (ZOFRAN) 4 MG tablet Take 4 mg by mouth every 4 (four) hours as needed for nausea or vomiting.   OXYGEN 2lpm as needed for sats less than 88% every 1 hour as needed   pantoprazole  (PROTONIX) 40 MG tablet Take 40 mg by mouth daily.   PARoxetine (PAXIL) 30 MG tablet Take 30 mg by mouth daily.   Polyethyl Glycol-Propyl Glycol 0.4-0.3 % SOLN Apply to eye 2 (two) times daily as needed.  polyethylene glycol (MIRALAX / GLYCOLAX) 17 g packet Take 17 g by mouth daily as needed.   PRADAXA 150 MG CAPS capsule TAKE 1 CAPSULE BY MOUTH TWICE DAILY START TREATMENT 10 HRS LAST DOSE OF LOVENOX   SUNSCREEN SPF50 EX Apply topically. Apply to exposed skin topically every 2 hours as needed for Sunburn Prevention   tiZANidine (ZANAFLEX) 2 MG tablet Take 2 mg by mouth every 12 (twelve) hours as needed for muscle spasms.   triamcinolone cream (KENALOG) 0.1 % Apply 1 Application topically as directed.  Apply to Back topically every day and evening shift for Rash/Itching   [DISCONTINUED] saccharomyces boulardii (FLORASTOR) 250 MG capsule Take 250 mg by mouth daily.   [DISCONTINUED] Zinc Oxide (TRIPLE PASTE) 12.8 % ointment Apply to sacrum/buttocks topically every shift. (Patient not taking: Reported on 03/31/2023)   No facility-administered encounter medications on file as of 04/22/2023.    Review of Systems  Immunization History  Administered Date(s) Administered   Influenza, High Dose Seasonal PF 03/07/2023   Influenza-Unspecified 02/03/2020, 02/18/2021, 02/18/2022   Moderna Covid-19 Fall Seasonal Vaccine 76yrs & older 08/12/2022   PNEUMOCOCCAL CONJUGATE-20 01/17/2023   Pneumococcal Conjugate-13 09/28/2017   Tdap 12/31/2022   Unspecified SARS-COV-2 Vaccination 05/20/2019, 05/20/2019, 06/17/2019, 03/16/2020, 09/20/2020, 01/25/2021, 01/30/2023, 03/07/2023   Pertinent  Health Maintenance Due  Topic Date Due   HEMOGLOBIN A1C  09/17/2023   OPHTHALMOLOGY EXAM  10/30/2023   FOOT EXAM  12/11/2023   INFLUENZA VACCINE  Completed   DEXA SCAN  Completed      12/18/2021    7:45 PM 12/19/2021    7:00 AM 12/19/2021   12:00 PM 01/24/2022   11:20 AM 12/11/2022    1:31 PM  Fall Risk  Falls in the  past year?     0  (RETIRED) Patient Fall Risk Level High fall risk High fall risk High fall risk High fall risk    Functional Status Survey:    Vitals:   04/22/23 0941  BP: 110/68  Pulse: 83  Resp: 18  Temp: (!) 97.5 F (36.4 C)  SpO2: (!) 89%  Weight: 158 lb (71.7 kg)  Height: 5\' 3"  (1.6 m)   Body mass index is 27.99 kg/m. Physical Exam Constitutional:      Appearance: Normal appearance.  Cardiovascular:     Rate and Rhythm: Normal rate.     Pulses: Normal pulses.  Pulmonary:     Effort: Pulmonary effort is normal.  Abdominal:     General: Abdomen is flat.     Palpations: Abdomen is soft.  Musculoskeletal:     Comments: Left leg amputation  Neurological:     Mental Status: She is alert. Mental status is at baseline.     Labs reviewed: Recent Labs    03/28/23 0426 03/29/23 0414 03/30/23 0642 04/06/23 0000  NA 138 138 137 138  K 4.2 4.4 5.5* 5.4*  CL 101 103 103 101  CO2 29 27 25  29*  GLUCOSE 177* 173* 202*  --   BUN 11 10 8 16   CREATININE 0.49 0.50 0.62 0.6  CALCIUM 8.7* 9.1 9.3 10.0  MG 1.5* 1.9 1.7  --   PHOS 2.5 1.9* 2.4*  --    Recent Labs    03/21/23 0502 03/22/23 0500 03/23/23 0507  AST 18 15 22   ALT 10 12 14   ALKPHOS 45 46 53  BILITOT 0.4 <0.2 0.4  PROT 5.7* 5.9* 6.3*  ALBUMIN 2.8* 2.7* 2.8*   Recent Labs    03/09/23 0000 03/19/23  1849 03/21/23 0502 03/27/23 0545 03/28/23 0426 03/29/23 0414 04/06/23 0000  WBC 7.8 13.1*   < > 11.5* 11.0* 10.1 10.3  NEUTROABS 4,984.00 9.8*  --   --   --   --  7,025.00  HGB 11.8* 12.6   < > 11.2* 10.6* 10.9* 11.3*  HCT 37 40.5   < > 35.5* 34.1* 35.1* 36  MCV  --  91.4   < > 91.0 90.9 92.9  --   PLT 180 244   < > 272 278 312 264   < > = values in this interval not displayed.   Lab Results  Component Value Date   TSH 0.307 (L) 03/20/2023   Lab Results  Component Value Date   HGBA1C 6.5 (H) 03/20/2023   Lab Results  Component Value Date   CHOL 110 12/15/2021   HDL 19 (L) 12/15/2021    LDLCALC 46 12/15/2021   TRIG 223 (H) 12/15/2021   CHOLHDL 5.8 12/15/2021    Significant Diagnostic Results in last 30 days:  MM 3D SCREENING MAMMOGRAM BILATERAL BREAST Result Date: 04/16/2023 CLINICAL DATA:  Screening. EXAM: DIGITAL SCREENING BILATERAL MAMMOGRAM WITH TOMOSYNTHESIS AND CAD TECHNIQUE: Bilateral screening digital craniocaudal and mediolateral oblique mammograms were obtained. Bilateral screening digital breast tomosynthesis was performed. The images were evaluated with computer-aided detection. COMPARISON:  Previous exam(s). ACR Breast Density Category b: There are scattered areas of fibroglandular density. FINDINGS: There are no findings suspicious for malignancy. IMPRESSION: No mammographic evidence of malignancy. A result letter of this screening mammogram will be mailed directly to the patient. RECOMMENDATION: Screening mammogram in one year. (Code:SM-B-01Y) BI-RADS CATEGORY  1: Negative. Electronically Signed   By: Frederico Hamman M.D.   On: 04/16/2023 09:25   DG Bone Density Result Date: 04/14/2023 EXAM: DUAL X-RAY ABSORPTIOMETRY (DXA) FOR BONE MINERAL DENSITY IMPRESSION: Your patient Annika Orser completed a BMD test on 04/14/2023 using the [employer] iDXA DXA System (software version: 14.10) manufactured by [employer]. The following summarizes the results of our evaluation. Technologist: MTB PATIENT BIOGRAPHICAL: Name: Jamesyn, Player Patient ID: [id number] Birth Date: Nov 22, 1933 Height: 63.0 in. Gender: Female Exam Date: 04/14/2023 Weight: 155.4 lbs. Indications: Caucasian, High Risk Meds, Left Leg Amputation, Parent Hip Fracture, History of Breast Cancer, Family Hist. (Parent hip fracture), History of Radiation, History of Chemo, Height Loss, Diabetic, Osteoarthritis, Advanced Age, Postmenopausal Fractures: Treatments: Evista, Femara, Metformin, Claritin, Omeprazole, Vitamin D, Multi-Vitamin, Glipizide DENSITOMETRY RESULTS: Site         Region     Measured Date  Measured Age WHO Classification Young Adult T-score BMD         %Change vs. Previous Significant Change (*) AP Spine L1-L4 04/14/2023 86.9 Osteopenia -2.2 0.927 g/cm2 -11.0% Yes AP Spine L1-L4 10/21/2021 85.4 Osteopenia -1.2 1.042 g/cm2 4.9% Yes AP Spine L1-L4 10/18/2020 84.4 Osteopenia -1.6 0.993 g/cm2 -4.4% Yes AP Spine L1-L4 10/17/2019 83.4 Osteopenia -1.3 1.039 g/cm2 10.4% Yes AP Spine L1-L4 10/13/2018 82.4 Osteopenia -2.0 0.941 g/cm2 -1.5% - AP Spine L1-L4 09/23/2017 81.4 Osteopenia -1.9 0.955 g/cm2 - - Right Femur Neck 04/14/2023 86.9 Osteoporosis -2.6 0.675 g/cm2 -10.1% Yes Right Femur Neck 10/21/2021 85.4 Osteopenia -2.1 0.751 g/cm2 12.3% Yes Right Femur Neck 10/18/2020 84.4 Osteoporosis -2.7 0.669 g/cm2 -6.2% - Right Femur Neck 10/17/2019 83.4 Osteopenia -2.3 0.713 g/cm2 -18.0% Yes Right Femur Neck 10/13/2018 82.4 Osteopenia -1.2 0.869 g/cm2 19.5% Yes Right Femur Neck 09/23/2017 81.4 Osteopenia -2.2 0.727 g/cm2 - - Right Femur Total 04/14/2023 86.9 Osteoporosis -2.6 0.684 g/cm2 -5.9% Yes Right  Femur Total 10/21/2021 85.4 Osteopenia -2.2 0.727 g/cm2 4.8% Yes Right Femur Total 10/18/2020 84.4 Osteoporosis -2.5 0.694 g/cm2 -3.7% Yes Right Femur Total 10/17/2019 83.4 Osteopenia -2.3 0.721 g/cm2 -2.8% Yes Right Femur Total 10/13/2018 82.4 Osteopenia -2.1 0.742 g/cm2 5.8% Yes Right Femur Total 09/23/2017 81.4 Osteopenia -2.4 0.701 g/cm2 - - Left Forearm Radius 33% 04/14/2023 86.9 Osteoporosis -3.6 0.564 g/cm2 -17.8% Yes Left Forearm Radius 33% 10/21/2021 85.4 Osteopenia -2.2 0.686 g/cm2 20.4% Yes Left Forearm Radius 33% 10/18/2020 84.4 Osteoporosis -3.5 0.570 g/cm2 -4.2% - Left Forearm Radius 33% 10/17/2019 83.4 Osteoporosis -3.2 0.595 g/cm2 1.2% - Left Forearm Radius 33% 10/13/2018 82.4 Osteoporosis -3.3 0.588 g/cm2 -1.8% - Left Forearm Radius 33% 09/23/2017 81.4 Osteoporosis -3.2 0.599 g/cm2 - - ASSESSMENT: The BMD measured at Forearm Radius 33% is 0.564 g/cm2 with a T-score of -3.6. This patient's diagnostic  category is OSTEOPOROSIS according to World Health Organization [facility]) criteria. The scan quality is good. LEFT Femur was excluded due to amputation. Compared with 10/21/2021. Since the prior study, there has been a SIGNIFICANT DECREASE in bone mineral density of the lumbar spine (-11%), LEFT forearm (-17.8%) and RIGHT hip (-10.1%). World Science writer [facility]) criteria for post-menopausal, Caucasian Women: Normal:                   T-score at or above -1 SD Osteopenia/low bone mass: T-score between -1 and -2.5 SD Osteoporosis:             T-score at or below -2.5 SD RECOMMENDATIONS: 1. All patients should optimize calcium and vitamin D intake. 2. Consider FDA-approved medical therapies in postmenopausal women and men aged 21 years and older, based on the following: a. A hip or vertebral(clinical or morphometric) fracture b. T-score < -2.5 at the femoral neck or spine after appropriate evaluation to exclude secondary causes c. Low bone mass (T-score between -1.0 and -2.5 at the femoral neck or spine) and a 10-year probability of a hip fracture > 3% or a 10-year probability of a major osteoporosis-related fracture > 20% based on the [place]-adapted WHO algorithm 3. Clinician judgment and/or patient preferences may indicate treatment for people with 10-year fracture probabilities above or below these levels FOLLOW-UP: People with diagnosed cases of osteoporosis or at high risk for fracture should have regular bone mineral density tests. For patients eligible for Medicare, routine testing is allowed once every 2 years. The testing frequency can be increased to one year for patients who have rapidly progressing disease, those who are receiving or discontinuing medical therapy to restore bone mass, or have additional risk factors. I have reviewed this report, and agree with the above findings. [facility] Electronically Signed   By: Harmon Pier M.D.   On: 04/14/2023 11:54   DG Chest Port 1 View Result  Date: 03/26/2023 CLINICAL DATA:  89 year old female with shortness of breath. EXAM: PORTABLE CHEST 1 VIEW COMPARISON:  Portable chest 03/21/2023 and earlier. FINDINGS: Portable AP semi upright view at 1058 hours. Continued low lung volumes. Stable cardiac size and mediastinal contours. Stable ventilation, left lung base hypo ventilation with enhancing atelectasis there on CTA 03/19/2023. No pneumothorax. Stable pulmonary vascularity, no overt edema. Paucity of bowel gas. Dextroconvex scoliosis. IMPRESSION: Continued low lung volumes with basilar hypoventilation especially on the left. No new cardiopulmonary abnormality. Electronically Signed   By: Odessa Fleming M.D.   On: 03/26/2023 12:02   US RENAL Result Date: 03/23/2023 CLINICAL DATA:  Hematuria EXAM: RENAL / URINARY TRACT ULTRASOUND COMPLETE COMPARISON:  CT  06/27/2022. FINDINGS: Right Kidney: Renal measurements: 11.4 x 5.3 x 4.8 cm = volume: 151.3 mL. Mild renal atrophy. No collecting system dilatation or perinephric fluid. Small renal stones identified measuring up to 5 mm. Left Kidney: Renal measurements: 11.2 x 4.7 x 4.3 cm = volume: 119.5 mL. No collecting system dilatation. Perinephric fluid. Renal stones again seen measuring up to 4 mm by ultrasound Bladder: Appears normal for degree of bladder distention. Other: None. IMPRESSION: No collecting system dilatation. Bilateral renal stones. Please correlate with the prior CT scan Electronically Signed   By: Karen Kays M.D.   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  Denies pain with peripheral artery disease, Zanaflex as needed for muscle spasms.  Patient was  recently admitted to the hospital for acute metabolic encephalopathy found to have aspiration pneumonia.  No longer on oxygen supplementation at this time.  Mental status at baseline at this time.  Continue supportive care.  Rate well-controlled with metoprolol 50 mg daily.  Continue Pradaxa at this time, no acute signs of bleeding.  Diabetes well-controlled with glipizide and metformin.  Mood controlled with Paxil, patient has previously denied medication changes to first-line SSRI.  Previously discussed safety of this medication and aging population.  TSH slightly low recent visit with endocrinologist with dose adjustment follow-up consultation notes.  Family/ staff Communication: Nursing  Labs/tests ordered: None

## 2023-04-24 DIAGNOSIS — R278 Other lack of coordination: Secondary | ICD-10-CM | POA: Diagnosis not present

## 2023-04-24 DIAGNOSIS — M6281 Muscle weakness (generalized): Secondary | ICD-10-CM | POA: Diagnosis not present

## 2023-04-24 DIAGNOSIS — Z51A Encounter for sepsis aftercare: Secondary | ICD-10-CM | POA: Diagnosis not present

## 2023-04-24 DIAGNOSIS — E1142 Type 2 diabetes mellitus with diabetic polyneuropathy: Secondary | ICD-10-CM | POA: Diagnosis not present

## 2023-04-24 DIAGNOSIS — J9601 Acute respiratory failure with hypoxia: Secondary | ICD-10-CM | POA: Diagnosis not present

## 2023-04-24 DIAGNOSIS — N390 Urinary tract infection, site not specified: Secondary | ICD-10-CM | POA: Diagnosis not present

## 2023-04-24 DIAGNOSIS — I739 Peripheral vascular disease, unspecified: Secondary | ICD-10-CM | POA: Diagnosis not present

## 2023-04-26 ENCOUNTER — Encounter: Payer: Self-pay | Admitting: Student

## 2023-04-27 DIAGNOSIS — I739 Peripheral vascular disease, unspecified: Secondary | ICD-10-CM | POA: Diagnosis not present

## 2023-04-27 DIAGNOSIS — J9601 Acute respiratory failure with hypoxia: Secondary | ICD-10-CM | POA: Diagnosis not present

## 2023-04-27 DIAGNOSIS — N390 Urinary tract infection, site not specified: Secondary | ICD-10-CM | POA: Diagnosis not present

## 2023-04-27 DIAGNOSIS — R278 Other lack of coordination: Secondary | ICD-10-CM | POA: Diagnosis not present

## 2023-04-27 DIAGNOSIS — E1142 Type 2 diabetes mellitus with diabetic polyneuropathy: Secondary | ICD-10-CM | POA: Diagnosis not present

## 2023-04-27 DIAGNOSIS — Z51A Encounter for sepsis aftercare: Secondary | ICD-10-CM | POA: Diagnosis not present

## 2023-04-27 DIAGNOSIS — M6281 Muscle weakness (generalized): Secondary | ICD-10-CM | POA: Diagnosis not present

## 2023-05-08 DIAGNOSIS — R278 Other lack of coordination: Secondary | ICD-10-CM | POA: Diagnosis not present

## 2023-05-08 DIAGNOSIS — Z51A Encounter for sepsis aftercare: Secondary | ICD-10-CM | POA: Diagnosis not present

## 2023-05-08 DIAGNOSIS — M6281 Muscle weakness (generalized): Secondary | ICD-10-CM | POA: Diagnosis not present

## 2023-05-08 DIAGNOSIS — J9601 Acute respiratory failure with hypoxia: Secondary | ICD-10-CM | POA: Diagnosis not present

## 2023-05-08 DIAGNOSIS — I739 Peripheral vascular disease, unspecified: Secondary | ICD-10-CM | POA: Diagnosis not present

## 2023-05-08 DIAGNOSIS — N390 Urinary tract infection, site not specified: Secondary | ICD-10-CM | POA: Diagnosis not present

## 2023-05-08 DIAGNOSIS — E1142 Type 2 diabetes mellitus with diabetic polyneuropathy: Secondary | ICD-10-CM | POA: Diagnosis not present

## 2023-05-11 DIAGNOSIS — R278 Other lack of coordination: Secondary | ICD-10-CM | POA: Diagnosis not present

## 2023-05-11 DIAGNOSIS — E1142 Type 2 diabetes mellitus with diabetic polyneuropathy: Secondary | ICD-10-CM | POA: Diagnosis not present

## 2023-05-11 DIAGNOSIS — M6281 Muscle weakness (generalized): Secondary | ICD-10-CM | POA: Diagnosis not present

## 2023-05-11 DIAGNOSIS — N390 Urinary tract infection, site not specified: Secondary | ICD-10-CM | POA: Diagnosis not present

## 2023-05-11 DIAGNOSIS — I739 Peripheral vascular disease, unspecified: Secondary | ICD-10-CM | POA: Diagnosis not present

## 2023-05-11 DIAGNOSIS — Z51A Encounter for sepsis aftercare: Secondary | ICD-10-CM | POA: Diagnosis not present

## 2023-05-11 DIAGNOSIS — J9601 Acute respiratory failure with hypoxia: Secondary | ICD-10-CM | POA: Diagnosis not present

## 2023-05-13 DIAGNOSIS — R278 Other lack of coordination: Secondary | ICD-10-CM | POA: Diagnosis not present

## 2023-05-13 DIAGNOSIS — Z51A Encounter for sepsis aftercare: Secondary | ICD-10-CM | POA: Diagnosis not present

## 2023-05-13 DIAGNOSIS — E1142 Type 2 diabetes mellitus with diabetic polyneuropathy: Secondary | ICD-10-CM | POA: Diagnosis not present

## 2023-05-13 DIAGNOSIS — J9601 Acute respiratory failure with hypoxia: Secondary | ICD-10-CM | POA: Diagnosis not present

## 2023-05-13 DIAGNOSIS — N390 Urinary tract infection, site not specified: Secondary | ICD-10-CM | POA: Diagnosis not present

## 2023-05-13 DIAGNOSIS — I739 Peripheral vascular disease, unspecified: Secondary | ICD-10-CM | POA: Diagnosis not present

## 2023-05-13 DIAGNOSIS — M6281 Muscle weakness (generalized): Secondary | ICD-10-CM | POA: Diagnosis not present

## 2023-05-18 DIAGNOSIS — E1142 Type 2 diabetes mellitus with diabetic polyneuropathy: Secondary | ICD-10-CM | POA: Diagnosis not present

## 2023-05-18 DIAGNOSIS — N390 Urinary tract infection, site not specified: Secondary | ICD-10-CM | POA: Diagnosis not present

## 2023-05-18 DIAGNOSIS — I739 Peripheral vascular disease, unspecified: Secondary | ICD-10-CM | POA: Diagnosis not present

## 2023-05-18 DIAGNOSIS — M6281 Muscle weakness (generalized): Secondary | ICD-10-CM | POA: Diagnosis not present

## 2023-05-18 DIAGNOSIS — R278 Other lack of coordination: Secondary | ICD-10-CM | POA: Diagnosis not present

## 2023-05-18 DIAGNOSIS — J9601 Acute respiratory failure with hypoxia: Secondary | ICD-10-CM | POA: Diagnosis not present

## 2023-05-18 DIAGNOSIS — Z51A Encounter for sepsis aftercare: Secondary | ICD-10-CM | POA: Diagnosis not present

## 2023-05-19 DIAGNOSIS — K591 Functional diarrhea: Secondary | ICD-10-CM | POA: Diagnosis not present

## 2023-05-26 ENCOUNTER — Encounter: Payer: Self-pay | Admitting: Nurse Practitioner

## 2023-05-26 ENCOUNTER — Non-Acute Institutional Stay (SKILLED_NURSING_FACILITY): Payer: Medicare Other | Admitting: Nurse Practitioner

## 2023-05-26 DIAGNOSIS — I10 Essential (primary) hypertension: Secondary | ICD-10-CM

## 2023-05-26 DIAGNOSIS — I480 Paroxysmal atrial fibrillation: Secondary | ICD-10-CM

## 2023-05-26 DIAGNOSIS — M1711 Unilateral primary osteoarthritis, right knee: Secondary | ICD-10-CM

## 2023-05-26 DIAGNOSIS — E0590 Thyrotoxicosis, unspecified without thyrotoxic crisis or storm: Secondary | ICD-10-CM

## 2023-05-26 DIAGNOSIS — F3341 Major depressive disorder, recurrent, in partial remission: Secondary | ICD-10-CM | POA: Diagnosis not present

## 2023-05-26 DIAGNOSIS — E1142 Type 2 diabetes mellitus with diabetic polyneuropathy: Secondary | ICD-10-CM

## 2023-05-26 NOTE — Progress Notes (Unsigned)
Location:  [facility] [place].  Nursing Home Room Number: [facility] [address] Place of Service:  SNF [phone]) Abbey Chatters, NP  PCP: Earnestine Mealing, MD  Patient Care Team: Earnestine Mealing, MD as PCP - General (Family Medicine) Lemar Livings, Merrily Pew, MD (General Surgery) Jeralyn Ruths, MD as Consulting Physician (Oncology)  Extended Emergency Contact Information Primary Emergency Contact: [name] Address: [address]          [place], [place] [address] [place] of [place] Home Phone: [phone] Mobile Phone: [phone] Relation: Daughter  Goals of care: Advanced Directive information    05/26/2023   10:22 AM  Advanced Directives  Does Patient Have a Medical Advance Directive? Yes  Type of Advance Directive Out of facility DNR (pink MOST or yellow form)  Does patient want to make changes to medical advance directive? No - Patient declined     Chief Complaint  Patient presents with   Medical Management of Chronic Issues    Medical Management of Chronic Issues.     HPI:  Pt is a 89 y.o. female seen today for medical management of chronic disease. Pt with hx of CKD, PAD, a fib, DM, OP, HTN who was hospitalized in November due to AMS and pneumonia.  Continues to have issues with memory but feeling more at baseline Reports she is having regular BMs without constipation. Noted to have a new stage 2 sacral pressure injury which nursing is following and pressure reduction in place.  Reports usual pain to knee but no worsening of pain or new pains.  Mood has been good.  Followed by endocrinology due to hyperactive thyroid  Past Medical History:  Diagnosis Date   Anxiety    Benign breast lumps    Blood clotting disorder (HCC)    Bone cancer (HCC)    head of femur, left leg ... 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  Oncotype  recurrence score: 1.  DCIS, margin less than 0.5 mm.  Negative sentinel node.  Accelerated partial breast radiation.   Cervicalgia    Chronic kidney disease    kidney stones   Colon cancer [facility])    patient unaware of this   Complication of anesthesia    mood alteration / not sure if d/t pain medicine or anesthesia as she passed out    Cough    NASAL DRIP / SNEEZING / SORE THROAT MOSTLY CONSTANT   Depression    Diabetes (HCC)    Diverticulitis    Dizzy    GERD (gastroesophageal reflux disease)    H/O blood clots    arm and leg   HBP (high blood pressure)    Hematuria    gross   Hemorrhoids    HLD (hyperlipidemia)    Microscopic hematuria 06/02/2015   Muscle pain    Osteoarthritis    Osteoporosis    Panic disorder    Personal history of radiation therapy    PND (post-nasal drip)    CHRONIC WITH SORE THROAT AND SNEEZING   Reflux    Sepsis (HCC)    Sepsis due to pneumonia (HCC) 03/20/2023   Sepsis, unspecified organism (HCC) 12/15/2021   Formatting of this note might be different from the original.  Last Assessment & Plan: Formatting of this note might be different from the original. Appears to be multifactorial and secondary to healthcare associated pneumonia as  well as a UTI CT angio shows asymmetric airspace disease at the left base and posteriorly in the right upper lobe concerning for pneumonia Patient also has pyuria Patient   Severe sepsis (HCC) 12/15/2021   Skin cancer    nose   Swelling    Tremor    Tremors of nervous system    Ureteral stone with hydronephrosis 11/26/2018   Past Surgical History:  Procedure Laterality Date   APPENDECTOMY  1962   BREAST BIOPSY Right 2006?   benign   BREAST BIOPSY Left 2019   invasive mammary carcinoma   BREAST CYST EXCISION Left 07/27/2017   Procedure: SKIN CYST EXCISED;  Surgeon: Earline Mayotte, MD;  Location: [facility] ORS;  Service: General;  Laterality: Left;   BREAST LUMPECTOMY Left 07/2017   invasive mammary, DCIS   mammosite   BREAST SURGERY Right 2019   CARPAL TUNNEL RELEASE Right    CATARACT EXTRACTION W/PHACO Left 11/11/2016   Procedure: CATARACT EXTRACTION PHACO AND INTRAOCULAR LENS PLACEMENT (IOC);  Surgeon: Galen Manila, MD;  Location: [facility] ORS;  Service: Ophthalmology;  Laterality: Left;  [place] 01:07.9 AP% 19.2 CDE 13.02 Fluid pack lot # [id number]   CATARACT EXTRACTION W/PHACO Right 12/09/2016   Procedure: CATARACT EXTRACTION PHACO AND INTRAOCULAR LENS PLACEMENT (IOC);  Surgeon: Galen Manila, MD;  Location: [facility] ORS;  Service: Ophthalmology;  Laterality: Right;  [place] 00:49 AP% 21.2 CDE 10.39 Fluid pack lot # [id number]   CHOLECYSTECTOMY     COLONOSCOPY WITH PROPOFOL N/A 11/22/2014   Procedure: COLONOSCOPY WITH PROPOFOL;  Surgeon: Scot Jun, MD;  Location: [facility] ENDOSCOPY;  Service: Endoscopy;  Laterality: N/A;   CYSTOSCOPY WITH STENT PLACEMENT Bilateral 11/27/2018   Procedure: CYSTOSCOPY WITH STENT PLACEMENT;  Surgeon: Jerilee Field, MD;  Location: [facility] ORS;  Service: Urology;  Laterality: Bilateral;   CYSTOSCOPY/URETEROSCOPY/HOLMIUM LASER/STENT PLACEMENT Bilateral 12/17/2018   Procedure: CYSTOSCOPY/URETEROSCOPY/HOLMIUM LASER/STENT Exchange;  Surgeon: Sondra Come, MD;  Location: [facility] ORS;  Service: Urology;  Laterality: Bilateral;   ESOPHAGOGASTRODUODENOSCOPY  11/22/2014   Procedure: ESOPHAGOGASTRODUODENOSCOPY (EGD);  Surgeon: Scot Jun, MD;  Location[facility] END[facility]COPY;  Service: Endoscopy;;   ESOPHAGOGASTRODUODENOSCOPY (EGD) WITH PROPOFOL N/A 03/05/2017   Procedure: ESOPHAGOGASTRODUODENOSCOPY (EGD) WITH PROPOFOL;  Surgeon: Toney Reil, MD;  Location: [facility] ENDOSCOPY;  Service: Gastroenterology;  Laterality: N/A;   EXTRACORPOREAL SHOCK WAVE LITHOTRIPSY Right 03/04/2018   Procedure: EXTRACORPOREAL SHOCK WAVE LITHOTRIPSY (ESWL);  Surgeon: Sondra Come, MD;  Location: [facility] ORS;  Service: Urology;  Laterality: Right;   EYE SURGERY Bilateral 2012   cataract extraction with  iol   GALLBLADDER SURGERY  1968   hemi pelvectomy     left side at age 14   HEMIPELVIC Left 1962   LEG SURGERY Left    AMPUTATION d/t cancer at 89 years old   MASTECTOMY, PARTIAL Left 07/27/2017   Procedure: MASTECTOMY PARTIAL;  Surgeon: Earline Mayotte, MD;  Location: [facility] ORS;  Service: General;  Laterality: Left;   MOUTH SURGERY  2019   teeth pulled with a bridge insertion.   fell out 12/16/18   OVARY SURGERY Right    cyst removed   SAVORY DILATION  11/22/2014   Procedure: SAVORY DILATION;  Surgeon: Scot Jun, MD;  Location: [facility] ENDOSCOPY;  Service: Endoscopy;;   SENTINEL NODE BIOPSY Left 07/27/2017   Procedure: SENTINEL NODE BIOPSY;  Surgeon: Earline Mayotte, MD;  Location: [facility] ORS;  Service: General;  Laterality: Left;   SKIN CANCER EXCISION     nose and arm and face  TEE WITHOUT CARDIOVERSION N/A 07/25/2019   Procedure: TRANSESOPHAGEAL ECHOCARDIOGRAM (TEE);  Surgeon: Lamar Blinks, MD;  Location: [facility] ORS;  Service: Cardiovascular;  Laterality: N/A;   TONSILLECTOMY      Allergies  Allergen Reactions   Ciprofloxacin Hives   Codeine Other (See Comments)    HALLUCINATIONS   Tape     Rash and skin irritation/ paper tape and tegaderm OK   Ciprocinonide [Fluocinolone] Other (See Comments)    unknown   Amoxicillin Other (See Comments)    Upset stomach Has patient had a PCN reaction causing immediate rash, facial/tongue/throat swelling, SOB or lightheadedness with hypotension: No Has patient had a PCN reaction causing severe rash involving mucus membranes or skin necrosis: No Has patient had a PCN reaction that required hospitalization: No Has patient had a PCN reaction occurring within the last 10 years: Yes If all of the above answers are "NO", then [place] proceed with Cephalosporin use.;   Cefuroxime Other (See Comments)    OK INTRACAMERALLY PER DR WLP, upset stomach   Latex Rash    Rast test NEGATIVE   Lipitor [Atorvastatin] Other (See Comments)    unknown     Outpatient Encounter Medications as of 05/26/2023  Medication Sig   acetaminophen (TYLENOL) 650 MG CR tablet Take 650 mg by mouth every 8 (eight) hours as needed for pain.   Cholecalciferol (VITAMIN D-1000 MAX ST) 25 MCG (1000 UT) tablet Take by mouth.   dextromethorphan-guaiFENesin (ROBITUSSIN-DM) 10-100 MG/5ML liquid Take 10 mLs by mouth every 4 (four) hours as needed for cough.   diclofenac Sodium (VOLTAREN) 1 % GEL Apply 4 g topically. Apply to stump topically every 6 hours as needed. Apply to right knee topically three times daily for pain.   fluticasone (FLONASE) 50 MCG/ACT nasal spray Place 2 sprays into both nostrils 2 (two) times daily.   gabapentin (NEURONTIN) 100 MG capsule Take 100 mg by mouth 3 (three) times daily.   GLIPIZIDE XL 2.5 MG 24 hr tablet Take 2.5 mg by mouth daily.    hydrocortisone 2.5 % cream Apply to Hemorrhoids/buttucks topically every 8 hours as needed for hemorrhoids.   hydrocortisone cream 1 % Apply to rectum topically every 6 hours as needed   ipratropium-albuterol (DUONEB) 0.5-2.5 (3) MG/3ML SOLN Take 3 mLs by nebulization every 4 (four) hours as needed.   loratadine (CLARITIN) 10 MG tablet Take 10 mg by mouth daily.   melatonin 3 MG TABS tablet Take 3 mg by mouth at bedtime.   metFORMIN (GLUCOPHAGE) 500 MG tablet Take 750 mg by mouth 2 (two) times daily with a meal.   methimazole (TAPAZOLE) 10 MG tablet Take 1 tablet by mouth daily.   metoprolol tartrate (LOPRESSOR) 50 MG tablet Take 50 mg by mouth 2 (two) times daily.   ondansetron (ZOFRAN) 4 MG tablet Take 4 mg by mouth every 4 (four) hours as needed for nausea or vomiting.   OXYGEN 2lpm as needed for sats less than 88% every 1 hour as needed   pantoprazole (PROTONIX) 40 MG tablet Take 40 mg by mouth daily.   PARoxetine (PAXIL) 30 MG tablet Take 30 mg by mouth daily.   Polyethyl Glycol-Propyl Glycol 0.4-0.3 % SOLN Apply to eye 2 (two) times daily as needed.   polyethylene glycol (MIRALAX / GLYCOLAX) 17  g packet Take 17 g by mouth daily as needed.   PRADAXA 150 MG CAPS capsule TAKE 1 CAPSULE BY MOUTH TWICE DAILY START TREATMENT 10 HRS LAST DOSE OF LOVENOX   SUNSCREEN  SPF50 EX Apply topically. Apply to exposed skin topically every 2 hours as needed for Sunburn Prevention   tiZANidine (ZANAFLEX) 2 MG tablet Take 2 mg by mouth every 12 (twelve) hours as needed for muscle spasms.   triamcinolone cream (KENALOG) 0.1 % Apply 1 Application topically as directed.  Apply to Back topically every day and evening shift for Rash/Itching   nystatin cream (MYCOSTATIN) Apply 1 Application topically as directed.  Apply to abdominal folds topically every day and evening shift for Fungal Rash,  Apply to axilla bilateral topically every day and evening shift for Fungal Rash, Apply to groin topically as needed for rash, lastly Apply to groin topically every day and evening shift for Fungal Rash. Total of 4 different sets of instructions per [facility] (Patient not taking: Reported on 05/26/2023)   No facility-administered encounter medications on file as of 05/26/2023.    Review of Systems  Constitutional:  Negative for activity change, appetite change, fatigue and unexpected weight change.  HENT:  Negative for congestion and hearing loss.   Eyes: Negative.   Respiratory:  Negative for cough and shortness of breath.   Cardiovascular:  Negative for chest pain, palpitations and leg swelling.  Gastrointestinal:  Negative for abdominal pain, constipation and diarrhea.  Genitourinary:  Negative for difficulty urinating and dysuria.  Musculoskeletal:  Positive for arthralgias. Negative for myalgias.  Skin:  Positive for wound. Negative for color change.  Neurological:  Negative for dizziness and weakness.  Psychiatric/Behavioral:  Negative for agitation, behavioral problems and confusion.      Immunization History  Administered Date(s) Administered   Influenza, High Dose Seasonal PF 03/07/2023    Influenza-Unspecified 02/03/2020, 02/18/2021, 02/18/2022   Moderna Covid-19 Fall Seasonal Vaccine 50yrs & older 08/12/2022   PNEUMOCOCCAL CONJUGATE-20 01/17/2023   Pneumococcal Conjugate-13 09/28/2017   Tdap 12/31/2022   Unspecified SARS-COV-2 Vaccination 05/20/2019, 05/20/2019, 06/17/2019, 03/16/2020, 09/20/2020, 01/25/2021, 01/30/2023, 03/07/2023   Pertinent  Health Maintenance Due  Topic Date Due   HEMOGLOBIN A1C  09/17/2023   OPHTHALMOLOGY EXAM  10/30/2023   FOOT EXAM  12/11/2023   INFLUENZA VACCINE  Completed   DEXA SCAN  Completed      12/18/2021    7:45 PM 12/19/2021    7:00 AM 12/19/2021   12:00 PM 01/24/2022   11:20 AM 12/11/2022    1:31 PM  Fall Risk  Falls in the past year?     0  (RETIRED) Patient Fall Risk Level High fall risk High fall risk High fall risk High fall risk    Functional Status Survey:    Vitals:   05/26/23 1004  BP: 102/60  Pulse: (!) 51  Resp: 18  Temp: (!) 97.1 F (36.2 C)  SpO2: 93%  Weight: 161 lb 6.4 oz (73.2 kg)  Height: 5\' 3"  (1.6 m)   Body mass index is 28.59 kg/m. Physical Exam Constitutional:      General: She is not in acute distress.    Appearance: She is well-developed. She is not diaphoretic.  HENT:     Head: Normocephalic and atraumatic.     Mouth/Throat:     Pharynx: No oropharyngeal exudate.  Eyes:     Conjunctiva/sclera: Conjunctivae normal.     Pupils: Pupils are equal, round, and reactive to light.  Cardiovascular:     Rate and Rhythm: Normal rate and regular rhythm.     Heart sounds: Normal heart sounds.  Pulmonary:     Effort: Pulmonary effort is normal.     Breath sounds: Normal  breath sounds.  Abdominal:     General: Bowel sounds are normal.     Palpations: Abdomen is soft.  Musculoskeletal:     Cervical back: Normal range of motion and neck supple.     Right lower leg: No edema.     Left lower leg: No edema.  Skin:    General: Skin is warm and dry.  Neurological:     Mental Status: She is alert.   Psychiatric:        Mood and Affect: Mood normal.     Labs reviewed: Recent Labs    03/28/23 0426 03/29/23 0414 03/30/23 0642 04/06/23 0000  NA 138 138 137 138  K 4.2 4.4 5.5* 5.4*  CL 101 103 103 101  CO2 29 27 25  29*  GLUCOSE 177* 173* 202*  --   BUN 11 10 8 16   CREATININE 0.49 0.50 0.62 0.6  CALCIUM 8.7* 9.1 9.3 10.0  MG 1.5* 1.9 1.7  --   PHOS 2.5 1.9* 2.4*  --    Recent Labs    03/21/23 0502 03/22/23 0500 03/23/23 0507  AST 18 15 22   ALT 10 12 14   ALKPHOS 45 46 53  BILITOT 0.4 <0.2 0.4  PROT 5.7* 5.9* 6.3*  ALBUMIN 2.8* 2.7* 2.8*   Recent Labs    03/09/23 0000 03/19/23 1849 03/21/23 0502 03/27/23 0545 03/28/23 0426 03/29/23 0414 04/06/23 0000  WBC 7.8 13.1*   < > 11.5* 11.0* 10.1 10.3  NEUTROABS 4,984.00 9.8*  --   --   --   --  7,025.00  HGB 11.8* 12.6   < > 11.2* 10.6* 10.9* 11.3*  HCT 37 40.5   < > 35.5* 34.1* 35.1* 36  MCV  --  91.4   < > 91.0 90.9 92.9  --   PLT 180 244   < > 272 278 312 264   < > = values in this interval not displayed.   Lab Results  Component Value Date   TSH 3.77 04/16/2023   Lab Results  Component Value Date   HGBA1C 6.5 (H) 03/20/2023   Lab Results  Component Value Date   CHOL 110 12/15/2021   HDL 19 (L) 12/15/2021   LDLCALC 46 12/15/2021   TRIG 223 (H) 12/15/2021   CHOLHDL 5.8 12/15/2021    Significant Diagnostic Results in last 30 days:  No results found.  Assessment/Plan 1. Primary hypertension (Primary) -Blood pressure well controlled, goal bp <140/90 Continue current medications and dietary modifications follow metabolic panel  2. Paroxysmal atrial fibrillation (HCC) Rate controlled, continues on pradaxa for anticoagulation   3. Type 2 diabetes mellitus with peripheral neuropathy (HCC) -A1c at goal on last labs. Encouraged dietary compliance, routine foot care/monitoring and to keep up with diabetic eye exams through ophthalmology  Continues on glipizide, no reports of hypoglycemia   4.  Recurrent major depressive disorder, in partial remission (HCC) -stable on paxil  5. Hyperthyroidism -continues on tapazole, last TSH at goal  6. Osteoarthritis of right knee, unspecified osteoarthritis type Ongoing but stable, continues on scheduled tylenol     Jessica K. Biagio Borg [facility] [phone]

## 2023-05-27 DIAGNOSIS — E1142 Type 2 diabetes mellitus with diabetic polyneuropathy: Secondary | ICD-10-CM | POA: Diagnosis not present

## 2023-05-27 DIAGNOSIS — R278 Other lack of coordination: Secondary | ICD-10-CM | POA: Diagnosis not present

## 2023-05-27 DIAGNOSIS — J9601 Acute respiratory failure with hypoxia: Secondary | ICD-10-CM | POA: Diagnosis not present

## 2023-05-27 DIAGNOSIS — M6281 Muscle weakness (generalized): Secondary | ICD-10-CM | POA: Diagnosis not present

## 2023-05-27 DIAGNOSIS — Z51A Encounter for sepsis aftercare: Secondary | ICD-10-CM | POA: Diagnosis not present

## 2023-05-27 DIAGNOSIS — I739 Peripheral vascular disease, unspecified: Secondary | ICD-10-CM | POA: Diagnosis not present

## 2023-05-27 DIAGNOSIS — N390 Urinary tract infection, site not specified: Secondary | ICD-10-CM | POA: Diagnosis not present

## 2023-05-29 DIAGNOSIS — R278 Other lack of coordination: Secondary | ICD-10-CM | POA: Diagnosis not present

## 2023-05-29 DIAGNOSIS — M6281 Muscle weakness (generalized): Secondary | ICD-10-CM | POA: Diagnosis not present

## 2023-05-29 DIAGNOSIS — I739 Peripheral vascular disease, unspecified: Secondary | ICD-10-CM | POA: Diagnosis not present

## 2023-05-29 DIAGNOSIS — E1142 Type 2 diabetes mellitus with diabetic polyneuropathy: Secondary | ICD-10-CM | POA: Diagnosis not present

## 2023-05-29 DIAGNOSIS — N390 Urinary tract infection, site not specified: Secondary | ICD-10-CM | POA: Diagnosis not present

## 2023-05-29 DIAGNOSIS — Z51A Encounter for sepsis aftercare: Secondary | ICD-10-CM | POA: Diagnosis not present

## 2023-05-29 DIAGNOSIS — J9601 Acute respiratory failure with hypoxia: Secondary | ICD-10-CM | POA: Diagnosis not present

## 2023-06-01 DIAGNOSIS — N390 Urinary tract infection, site not specified: Secondary | ICD-10-CM | POA: Diagnosis not present

## 2023-06-01 DIAGNOSIS — E1142 Type 2 diabetes mellitus with diabetic polyneuropathy: Secondary | ICD-10-CM | POA: Diagnosis not present

## 2023-06-01 DIAGNOSIS — M6281 Muscle weakness (generalized): Secondary | ICD-10-CM | POA: Diagnosis not present

## 2023-06-01 DIAGNOSIS — R278 Other lack of coordination: Secondary | ICD-10-CM | POA: Diagnosis not present

## 2023-06-01 DIAGNOSIS — Z51A Encounter for sepsis aftercare: Secondary | ICD-10-CM | POA: Diagnosis not present

## 2023-06-01 DIAGNOSIS — J9601 Acute respiratory failure with hypoxia: Secondary | ICD-10-CM | POA: Diagnosis not present

## 2023-06-01 DIAGNOSIS — I739 Peripheral vascular disease, unspecified: Secondary | ICD-10-CM | POA: Diagnosis not present

## 2023-06-03 DIAGNOSIS — E1142 Type 2 diabetes mellitus with diabetic polyneuropathy: Secondary | ICD-10-CM | POA: Diagnosis not present

## 2023-06-03 DIAGNOSIS — Z51A Encounter for sepsis aftercare: Secondary | ICD-10-CM | POA: Diagnosis not present

## 2023-06-03 DIAGNOSIS — I739 Peripheral vascular disease, unspecified: Secondary | ICD-10-CM | POA: Diagnosis not present

## 2023-06-03 DIAGNOSIS — M6281 Muscle weakness (generalized): Secondary | ICD-10-CM | POA: Diagnosis not present

## 2023-06-03 DIAGNOSIS — J9601 Acute respiratory failure with hypoxia: Secondary | ICD-10-CM | POA: Diagnosis not present

## 2023-06-03 DIAGNOSIS — R278 Other lack of coordination: Secondary | ICD-10-CM | POA: Diagnosis not present

## 2023-06-03 DIAGNOSIS — N390 Urinary tract infection, site not specified: Secondary | ICD-10-CM | POA: Diagnosis not present

## 2023-06-05 DIAGNOSIS — Z51A Encounter for sepsis aftercare: Secondary | ICD-10-CM | POA: Diagnosis not present

## 2023-06-05 DIAGNOSIS — I739 Peripheral vascular disease, unspecified: Secondary | ICD-10-CM | POA: Diagnosis not present

## 2023-06-05 DIAGNOSIS — R278 Other lack of coordination: Secondary | ICD-10-CM | POA: Diagnosis not present

## 2023-06-05 DIAGNOSIS — J9601 Acute respiratory failure with hypoxia: Secondary | ICD-10-CM | POA: Diagnosis not present

## 2023-06-05 DIAGNOSIS — N390 Urinary tract infection, site not specified: Secondary | ICD-10-CM | POA: Diagnosis not present

## 2023-06-05 DIAGNOSIS — E1142 Type 2 diabetes mellitus with diabetic polyneuropathy: Secondary | ICD-10-CM | POA: Diagnosis not present

## 2023-06-05 DIAGNOSIS — M6281 Muscle weakness (generalized): Secondary | ICD-10-CM | POA: Diagnosis not present

## 2023-06-10 DIAGNOSIS — Z51A Encounter for sepsis aftercare: Secondary | ICD-10-CM | POA: Diagnosis not present

## 2023-06-10 DIAGNOSIS — M6281 Muscle weakness (generalized): Secondary | ICD-10-CM | POA: Diagnosis not present

## 2023-06-10 DIAGNOSIS — J9601 Acute respiratory failure with hypoxia: Secondary | ICD-10-CM | POA: Diagnosis not present

## 2023-06-10 DIAGNOSIS — I739 Peripheral vascular disease, unspecified: Secondary | ICD-10-CM | POA: Diagnosis not present

## 2023-06-10 DIAGNOSIS — N390 Urinary tract infection, site not specified: Secondary | ICD-10-CM | POA: Diagnosis not present

## 2023-06-10 DIAGNOSIS — E1142 Type 2 diabetes mellitus with diabetic polyneuropathy: Secondary | ICD-10-CM | POA: Diagnosis not present

## 2023-06-10 DIAGNOSIS — R278 Other lack of coordination: Secondary | ICD-10-CM | POA: Diagnosis not present

## 2023-06-12 DIAGNOSIS — Z51A Encounter for sepsis aftercare: Secondary | ICD-10-CM | POA: Diagnosis not present

## 2023-06-12 DIAGNOSIS — E1142 Type 2 diabetes mellitus with diabetic polyneuropathy: Secondary | ICD-10-CM | POA: Diagnosis not present

## 2023-06-12 DIAGNOSIS — J9601 Acute respiratory failure with hypoxia: Secondary | ICD-10-CM | POA: Diagnosis not present

## 2023-06-12 DIAGNOSIS — M6281 Muscle weakness (generalized): Secondary | ICD-10-CM | POA: Diagnosis not present

## 2023-06-12 DIAGNOSIS — R278 Other lack of coordination: Secondary | ICD-10-CM | POA: Diagnosis not present

## 2023-06-12 DIAGNOSIS — I739 Peripheral vascular disease, unspecified: Secondary | ICD-10-CM | POA: Diagnosis not present

## 2023-06-12 DIAGNOSIS — N390 Urinary tract infection, site not specified: Secondary | ICD-10-CM | POA: Diagnosis not present

## 2023-06-15 DIAGNOSIS — J9601 Acute respiratory failure with hypoxia: Secondary | ICD-10-CM | POA: Diagnosis not present

## 2023-06-15 DIAGNOSIS — Z51A Encounter for sepsis aftercare: Secondary | ICD-10-CM | POA: Diagnosis not present

## 2023-06-15 DIAGNOSIS — R278 Other lack of coordination: Secondary | ICD-10-CM | POA: Diagnosis not present

## 2023-06-15 DIAGNOSIS — M6281 Muscle weakness (generalized): Secondary | ICD-10-CM | POA: Diagnosis not present

## 2023-06-15 DIAGNOSIS — I739 Peripheral vascular disease, unspecified: Secondary | ICD-10-CM | POA: Diagnosis not present

## 2023-06-15 DIAGNOSIS — E1142 Type 2 diabetes mellitus with diabetic polyneuropathy: Secondary | ICD-10-CM | POA: Diagnosis not present

## 2023-06-15 DIAGNOSIS — N390 Urinary tract infection, site not specified: Secondary | ICD-10-CM | POA: Diagnosis not present

## 2023-06-16 DIAGNOSIS — M6281 Muscle weakness (generalized): Secondary | ICD-10-CM | POA: Diagnosis not present

## 2023-06-16 DIAGNOSIS — Z51A Encounter for sepsis aftercare: Secondary | ICD-10-CM | POA: Diagnosis not present

## 2023-06-16 DIAGNOSIS — J9601 Acute respiratory failure with hypoxia: Secondary | ICD-10-CM | POA: Diagnosis not present

## 2023-06-16 DIAGNOSIS — E1142 Type 2 diabetes mellitus with diabetic polyneuropathy: Secondary | ICD-10-CM | POA: Diagnosis not present

## 2023-06-16 DIAGNOSIS — I739 Peripheral vascular disease, unspecified: Secondary | ICD-10-CM | POA: Diagnosis not present

## 2023-06-16 DIAGNOSIS — N390 Urinary tract infection, site not specified: Secondary | ICD-10-CM | POA: Diagnosis not present

## 2023-06-16 DIAGNOSIS — R278 Other lack of coordination: Secondary | ICD-10-CM | POA: Diagnosis not present

## 2023-06-17 DIAGNOSIS — M6281 Muscle weakness (generalized): Secondary | ICD-10-CM | POA: Diagnosis not present

## 2023-06-17 DIAGNOSIS — Z51A Encounter for sepsis aftercare: Secondary | ICD-10-CM | POA: Diagnosis not present

## 2023-06-17 DIAGNOSIS — N390 Urinary tract infection, site not specified: Secondary | ICD-10-CM | POA: Diagnosis not present

## 2023-06-17 DIAGNOSIS — R278 Other lack of coordination: Secondary | ICD-10-CM | POA: Diagnosis not present

## 2023-06-17 DIAGNOSIS — E1142 Type 2 diabetes mellitus with diabetic polyneuropathy: Secondary | ICD-10-CM | POA: Diagnosis not present

## 2023-06-17 DIAGNOSIS — I739 Peripheral vascular disease, unspecified: Secondary | ICD-10-CM | POA: Diagnosis not present

## 2023-06-17 DIAGNOSIS — J9601 Acute respiratory failure with hypoxia: Secondary | ICD-10-CM | POA: Diagnosis not present

## 2023-06-19 DIAGNOSIS — Z51A Encounter for sepsis aftercare: Secondary | ICD-10-CM | POA: Diagnosis not present

## 2023-06-19 DIAGNOSIS — R278 Other lack of coordination: Secondary | ICD-10-CM | POA: Diagnosis not present

## 2023-06-19 DIAGNOSIS — I739 Peripheral vascular disease, unspecified: Secondary | ICD-10-CM | POA: Diagnosis not present

## 2023-06-19 DIAGNOSIS — E1142 Type 2 diabetes mellitus with diabetic polyneuropathy: Secondary | ICD-10-CM | POA: Diagnosis not present

## 2023-06-19 DIAGNOSIS — J9601 Acute respiratory failure with hypoxia: Secondary | ICD-10-CM | POA: Diagnosis not present

## 2023-06-19 DIAGNOSIS — M6281 Muscle weakness (generalized): Secondary | ICD-10-CM | POA: Diagnosis not present

## 2023-06-19 DIAGNOSIS — N390 Urinary tract infection, site not specified: Secondary | ICD-10-CM | POA: Diagnosis not present

## 2023-06-23 DIAGNOSIS — J9601 Acute respiratory failure with hypoxia: Secondary | ICD-10-CM | POA: Diagnosis not present

## 2023-06-23 DIAGNOSIS — R278 Other lack of coordination: Secondary | ICD-10-CM | POA: Diagnosis not present

## 2023-06-23 DIAGNOSIS — N390 Urinary tract infection, site not specified: Secondary | ICD-10-CM | POA: Diagnosis not present

## 2023-06-23 DIAGNOSIS — E1142 Type 2 diabetes mellitus with diabetic polyneuropathy: Secondary | ICD-10-CM | POA: Diagnosis not present

## 2023-06-23 DIAGNOSIS — M6281 Muscle weakness (generalized): Secondary | ICD-10-CM | POA: Diagnosis not present

## 2023-06-23 DIAGNOSIS — I739 Peripheral vascular disease, unspecified: Secondary | ICD-10-CM | POA: Diagnosis not present

## 2023-06-23 DIAGNOSIS — Z51A Encounter for sepsis aftercare: Secondary | ICD-10-CM | POA: Diagnosis not present

## 2023-06-29 DIAGNOSIS — R278 Other lack of coordination: Secondary | ICD-10-CM | POA: Diagnosis not present

## 2023-06-29 DIAGNOSIS — Z51A Encounter for sepsis aftercare: Secondary | ICD-10-CM | POA: Diagnosis not present

## 2023-06-29 DIAGNOSIS — I739 Peripheral vascular disease, unspecified: Secondary | ICD-10-CM | POA: Diagnosis not present

## 2023-06-29 DIAGNOSIS — M6281 Muscle weakness (generalized): Secondary | ICD-10-CM | POA: Diagnosis not present

## 2023-06-29 DIAGNOSIS — N390 Urinary tract infection, site not specified: Secondary | ICD-10-CM | POA: Diagnosis not present

## 2023-06-29 DIAGNOSIS — E1142 Type 2 diabetes mellitus with diabetic polyneuropathy: Secondary | ICD-10-CM | POA: Diagnosis not present

## 2023-06-29 DIAGNOSIS — J9601 Acute respiratory failure with hypoxia: Secondary | ICD-10-CM | POA: Diagnosis not present

## 2023-06-30 DIAGNOSIS — N390 Urinary tract infection, site not specified: Secondary | ICD-10-CM | POA: Diagnosis not present

## 2023-06-30 DIAGNOSIS — E1142 Type 2 diabetes mellitus with diabetic polyneuropathy: Secondary | ICD-10-CM | POA: Diagnosis not present

## 2023-06-30 DIAGNOSIS — J9601 Acute respiratory failure with hypoxia: Secondary | ICD-10-CM | POA: Diagnosis not present

## 2023-06-30 DIAGNOSIS — R278 Other lack of coordination: Secondary | ICD-10-CM | POA: Diagnosis not present

## 2023-06-30 DIAGNOSIS — M6281 Muscle weakness (generalized): Secondary | ICD-10-CM | POA: Diagnosis not present

## 2023-06-30 DIAGNOSIS — Z51A Encounter for sepsis aftercare: Secondary | ICD-10-CM | POA: Diagnosis not present

## 2023-06-30 DIAGNOSIS — I739 Peripheral vascular disease, unspecified: Secondary | ICD-10-CM | POA: Diagnosis not present

## 2023-07-01 DIAGNOSIS — R278 Other lack of coordination: Secondary | ICD-10-CM | POA: Diagnosis not present

## 2023-07-01 DIAGNOSIS — E1142 Type 2 diabetes mellitus with diabetic polyneuropathy: Secondary | ICD-10-CM | POA: Diagnosis not present

## 2023-07-01 DIAGNOSIS — I739 Peripheral vascular disease, unspecified: Secondary | ICD-10-CM | POA: Diagnosis not present

## 2023-07-01 DIAGNOSIS — J9601 Acute respiratory failure with hypoxia: Secondary | ICD-10-CM | POA: Diagnosis not present

## 2023-07-01 DIAGNOSIS — E119 Type 2 diabetes mellitus without complications: Secondary | ICD-10-CM | POA: Diagnosis not present

## 2023-07-01 DIAGNOSIS — H26493 Other secondary cataract, bilateral: Secondary | ICD-10-CM | POA: Diagnosis not present

## 2023-07-01 DIAGNOSIS — N390 Urinary tract infection, site not specified: Secondary | ICD-10-CM | POA: Diagnosis not present

## 2023-07-01 DIAGNOSIS — H43813 Vitreous degeneration, bilateral: Secondary | ICD-10-CM | POA: Diagnosis not present

## 2023-07-01 DIAGNOSIS — Z51A Encounter for sepsis aftercare: Secondary | ICD-10-CM | POA: Diagnosis not present

## 2023-07-01 DIAGNOSIS — H4912 Fourth [trochlear] nerve palsy, left eye: Secondary | ICD-10-CM | POA: Diagnosis not present

## 2023-07-01 DIAGNOSIS — M6281 Muscle weakness (generalized): Secondary | ICD-10-CM | POA: Diagnosis not present

## 2023-07-02 ENCOUNTER — Other Ambulatory Visit: Payer: Self-pay | Admitting: Nurse Practitioner

## 2023-07-02 ENCOUNTER — Non-Acute Institutional Stay (SKILLED_NURSING_FACILITY): Payer: Medicare Other | Admitting: Nurse Practitioner

## 2023-07-02 ENCOUNTER — Encounter: Payer: Self-pay | Admitting: Nurse Practitioner

## 2023-07-02 DIAGNOSIS — F3341 Major depressive disorder, recurrent, in partial remission: Secondary | ICD-10-CM

## 2023-07-02 DIAGNOSIS — N390 Urinary tract infection, site not specified: Secondary | ICD-10-CM | POA: Diagnosis not present

## 2023-07-02 DIAGNOSIS — E1142 Type 2 diabetes mellitus with diabetic polyneuropathy: Secondary | ICD-10-CM | POA: Diagnosis not present

## 2023-07-02 DIAGNOSIS — Z51A Encounter for sepsis aftercare: Secondary | ICD-10-CM | POA: Diagnosis not present

## 2023-07-02 DIAGNOSIS — J9601 Acute respiratory failure with hypoxia: Secondary | ICD-10-CM | POA: Diagnosis not present

## 2023-07-02 DIAGNOSIS — K219 Gastro-esophageal reflux disease without esophagitis: Secondary | ICD-10-CM

## 2023-07-02 DIAGNOSIS — E0590 Thyrotoxicosis, unspecified without thyrotoxic crisis or storm: Secondary | ICD-10-CM | POA: Diagnosis not present

## 2023-07-02 DIAGNOSIS — K5904 Chronic idiopathic constipation: Secondary | ICD-10-CM

## 2023-07-02 DIAGNOSIS — M6281 Muscle weakness (generalized): Secondary | ICD-10-CM | POA: Diagnosis not present

## 2023-07-02 DIAGNOSIS — I10 Essential (primary) hypertension: Secondary | ICD-10-CM

## 2023-07-02 DIAGNOSIS — I739 Peripheral vascular disease, unspecified: Secondary | ICD-10-CM | POA: Diagnosis not present

## 2023-07-02 DIAGNOSIS — I480 Paroxysmal atrial fibrillation: Secondary | ICD-10-CM

## 2023-07-02 DIAGNOSIS — E875 Hyperkalemia: Secondary | ICD-10-CM

## 2023-07-02 DIAGNOSIS — M1711 Unilateral primary osteoarthritis, right knee: Secondary | ICD-10-CM

## 2023-07-02 DIAGNOSIS — R278 Other lack of coordination: Secondary | ICD-10-CM | POA: Diagnosis not present

## 2023-07-02 NOTE — Progress Notes (Signed)
 Location:  Other [place].  Nursing Home Room Number: [facility] [address]A Place of Service:  SNF [phone]) Abbey Chatters, NP  DGL:OVFIEP, Benetta Spar, MD  Patient Care Team: Earnestine Mealing, MD as PCP - General (Family Medicine) Lemar Livings, Merrily Pew, MD (General Surgery) Jeralyn Ruths, MD as Consulting Physician (Oncology)  Extended Emergency Contact Information Primary Emergency Contact: [name] Address: [address]          [place], [place] [address] [place] Home Phone: [phone] Mobile Phone: [phone] Relation: Daughter  Goals of care: Advanced Directive information    05/26/2023   10:22 AM  Advanced Directives  Does Patient Have a Medical Advance Directive? Yes  Type of Advance Directive Out of facility DNR (pink MOST or yellow form)  Does patient want to make changes to medical advance directive? No - Patient declined     Chief Complaint  Patient presents with   Medical Management of Chronic Issues    Medical Management of Chronic Issues.    Discussed the use of AI scribe software for clinical note transcription with the patient, who gave verbal consent to proceed.  HPI:  Pt is a 89 y.o. female seen today for medical management of chronic disease.   She reports feeling better and more like herself recently. Previously, she experienced constipation but is now returning to her usual routine of having two bowel movements a day.  She has a history of atrial fibrillation and is on Pradaxa for anticoagulation. She takes Lopressor twice a day for rate and blood pressure management. No chest pain, palpitations, or shortness of breath.  She has diabetes, with her last A1c being 6.5 in November, which is at goal. No symptoms of hypoglycemia. Her current medications for diabetes include metformin 750 mg twice a day and glipizide 2.5 mg daily. Denies side effects.   She experiences pain in her right knee and uses Tylenol and Voltaren gel twice a day,  which provides some relief. She can use the gel every six hours as needed for arthritis.  Her mood has been stable without recent anxiety or depression. She is on Paxil for depression.  She has hyperthyroidism and is under the care of an endocrinologist. She takes Tapazole 10 mg daily, and her last TSH was normal in December.  No issues with indigestion or acid reflux and is on Protonix 40 mg daily.  Her potassium levels have been intermittently high.   She reports sleeping well lately, although she sometimes wakes up at 3 AM to use the bathroom.  Past Medical History:  Diagnosis Date   Anxiety    Benign breast lumps    Blood clotting disorder (HCC)    Bone cancer (HCC)    head of femur, left leg ... 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  Oncotype recurrence score: 1.  DCIS, margin less than 0.5 mm.  Negative sentinel node.  Accelerated partial breast radiation.   Cervicalgia    Chronic kidney disease    kidney stones   Colon cancer [facility])    patient unaware of this   Complication of anesthesia    mood alteration / not sure if d/t pain medicine or anesthesia as she passed out    Cough    NASAL DRIP / SNEEZING / SORE THROAT MOSTLY CONSTANT   Depression    Diabetes (HCC)  Diverticulitis    Dizzy    GERD (gastroesophageal reflux disease)    H/O blood clots    arm and leg   HBP (high blood pressure)    Hematuria    gross   Hemorrhoids    HLD (hyperlipidemia)    Microscopic hematuria 06/02/2015   Muscle pain    Osteoarthritis    Osteoporosis    Panic disorder    Personal history of radiation therapy    PND (post-nasal drip)    CHRONIC WITH SORE THROAT AND SNEEZING   Reflux    Sepsis (HCC)    Sepsis due to pneumonia (HCC) 03/20/2023   Sepsis, unspecified organism (HCC) 12/15/2021   Formatting of this note might be different from the  original.  Last Assessment & Plan: Formatting of this note might be different from the original. Appears to be multifactorial and secondary to healthcare associated pneumonia as well as a UTI CT angio shows asymmetric airspace disease at the left base and posteriorly in the right upper lobe concerning for pneumonia Patient also has pyuria Patient   Severe sepsis (HCC) 12/15/2021   Skin cancer    nose   Swelling    Tremor    Tremors of nervous system    Ureteral stone with hydronephrosis 11/26/2018   Past Surgical History:  Procedure Laterality Date   APPENDECTOMY  1962   BREAST BIOPSY Right 2006?   benign   BREAST BIOPSY Left 2019   invasive mammary carcinoma   BREAST CYST EXCISION Left 07/27/2017   Procedure: SKIN CYST EXCISED;  Surgeon: Earline Mayotte, MD;  Location: [facility] ORS;  Service: General;  Laterality: Left;   BREAST LUMPECTOMY Left 07/2017   invasive mammary, DCIS  mammosite   BREAST SURGERY Right 2019   CARPAL TUNNEL RELEASE Right    CATARACT EXTRACTION W/PHACO Left 11/11/2016   Procedure: CATARACT EXTRACTION PHACO AND INTRAOCULAR LENS PLACEMENT (IOC);  Surgeon: Galen Manila, MD;  Location: [facility] ORS;  Service: Ophthalmology;  Laterality: Left;  [place] 01:07.9 AP% 19.2 CDE 13.02 Fluid pack lot # [id number]   CATARACT EXTRACTION W/PHACO Right 12/09/2016   Procedure: CATARACT EXTRACTION PHACO AND INTRAOCULAR LENS PLACEMENT (IOC);  Surgeon: Galen Manila, MD;  Location: [facility] ORS;  Service: Ophthalmology;  Laterality: Right;  [place] 00:49 AP% 21.2 CDE 10.39 Fluid pack lot # [id number]   CHOLECYSTECTOMY     COLONOSCOPY WITH PROPOFOL N/A 11/22/2014   Procedure: COLONOSCOPY WITH PROPOFOL;  Surgeon: Scot Jun, MD;  Location: [facility] ENDOSCOPY;  Service: Endoscopy;  Laterality: N/A;   CYSTOSCOPY WITH STENT PLACEMENT Bilateral 11/27/2018   Procedure: CYSTOSCOPY WITH STENT PLACEMENT;  Surgeon: Jerilee Field, MD;  Location: [facility] ORS;  Service: Urology;  Laterality: Bilateral;    CYSTOSCOPY/URETEROSCOPY/HOLMIUM LASER/STENT PLACEMENT Bilateral 12/17/2018   Procedure: CYSTOSCOPY/URETEROSCOPY/HOLMIUM LASER/STENT Exchange;  Surgeon: Sondra Come, MD;  Location: [facility] ORS;  Service: Urology;  Laterality: Bilateral;   ESOPHAGOGASTRODUODENOSCOPY  11/22/2014   Procedure: ESOPHAGOGASTRODUODENOSCOPY (EGD);  Surgeon: Scot Jun, MD;  Location: [facility] ENDOSCOPY;  Service: Endoscopy;;   ESOPHAGOGASTRODUODENOSCOPY (EGD) WITH PROPOFOL N/A 03/05/2017   Procedure: ESOPHAGOGASTRODUODENOSCOPY (EGD) WITH PROPOFOL;  Surgeon: Toney Reil, MD;  Location: [facility] ENDOSCOPY;  Service: Gastroenterology;  Laterality: N/A;   EXTRACORPOREAL SHOCK WAVE LITHOTRIPSY Right 03/04/2018   Procedure: EXTRACORPOREAL SHOCK WAVE LITHOTRIPSY (ESWL);  Surgeon: Sondra Come, MD;  Location: [facility] ORS;  Service: Urology;  Laterality: Right;   EYE SURGERY Bilateral 2012   cataract extraction with iol   GALLBLADDER SURGERY  1968   hemi  pelvectomy     left side at age 69   HEMIPELVIC Left 1962   LEG SURGERY Left    AMPUTATION d/t cancer at 89 years old   MASTECTOMY, PARTIAL Left 07/27/2017   Procedure: MASTECTOMY PARTIAL;  Surgeon: Earline Mayotte, MD;  Location: [facility] ORS;  Service: General;  Laterality: Left;   MOUTH SURGERY  2019   teeth pulled with a bridge insertion.   fell out 12/16/18   OVARY SURGERY Right    cyst removed   SAVORY DILATION  11/22/2014   Procedure: SAVORY DILATION;  Surgeon: Scot Jun, MD;  Location: [facility] ENDOSCOPY;  Service: Endoscopy;;   SENTINEL NODE BIOPSY Left 07/27/2017   Procedure: SENTINEL NODE BIOPSY;  Surgeon: Earline Mayotte, MD;  Location: [facility] ORS;  Service: General;  Laterality: Left;   SKIN CANCER EXCISION     nose and arm and face   TEE WITHOUT CARDIOVERSION N/A 07/25/2019   Procedure: TRANSESOPHAGEAL ECHOCARDIOGRAM (TEE);  Surgeon: Lamar Blinks, MD;  Location: [facility] ORS;  Service: Cardiovascular;  Laterality: N/A;   TONSILLECTOMY      Allergies   Allergen Reactions   Ciprofloxacin Hives   Codeine Other (See Comments)    HALLUCINATIONS   Tape     Rash and skin irritation/ paper tape and tegaderm OK   Ciprocinonide [Fluocinolone] Other (See Comments)    unknown   Amoxicillin Other (See Comments)    Upset stomach Has patient had a PCN reaction causing immediate rash, facial/tongue/throat swelling, SOB or lightheadedness with hypotension: No Has patient had a PCN reaction causing severe rash involving mucus membranes or skin necrosis: No Has patient had a PCN reaction that required hospitalization: No Has patient had a PCN reaction occurring within the last 10 years: Yes If all of the above answers are "NO", then may proceed with Cephalosporin use.;   Cefuroxime Other (See Comments)    OK INTRACAMERALLY PER DR WLP, upset stomach   Latex Rash    Rast test NEGATIVE   Lipitor [Atorvastatin] Other (See Comments)    unknown    Outpatient Encounter Medications as of 07/02/2023  Medication Sig   acetaminophen (TYLENOL) 650 MG CR tablet Take 650 mg by mouth every 8 (eight) hours as needed for pain.   Cholecalciferol (VITAMIN D-1000 MAX ST) 25 MCG (1000 UT) tablet Take 1,000 Units by mouth daily.   dextromethorphan-guaiFENesin (ROBITUSSIN-DM) 10-100 MG/5ML liquid Take 10 mLs by mouth every 4 (four) hours as needed for cough.   diclofenac Sodium (VOLTAREN) 1 % GEL Apply 4 g topically. Apply to stump topically every 6 hours as needed. Apply to right knee topically three times daily for pain.   fluticasone (FLONASE) 50 MCG/ACT nasal spray Place 2 sprays into both nostrils 2 (two) times daily.   gabapentin (NEURONTIN) 100 MG capsule Take 100 mg by mouth 3 (three) times daily.   GLIPIZIDE XL 2.5 MG 24 hr tablet Take 2.5 mg by mouth daily.    hydrocortisone 2.5 % cream Apply to Hemorrhoids/buttucks topically every 8 hours as needed for hemorrhoids.   ipratropium-albuterol (DUONEB) 0.5-2.5 (3) MG/3ML SOLN Take 3 mLs by nebulization every 4  (four) hours as needed.   loratadine (CLARITIN) 10 MG tablet Take 10 mg by mouth daily.   melatonin 3 MG TABS tablet Take 3 mg by mouth at bedtime.   metFORMIN (GLUCOPHAGE) 500 MG tablet Take 750 mg by mouth 2 (two) times daily with a meal.   methimazole (TAPAZOLE) 10 MG tablet Take 1 tablet by  mouth daily.   metoprolol tartrate (LOPRESSOR) 25 MG tablet Take 25 mg by mouth 2 (two) times daily.   ondansetron (ZOFRAN) 4 MG tablet Take 4 mg by mouth every 4 (four) hours as needed for nausea or vomiting.   OXYGEN 2lpm as needed for sats less than 88% every 1 hour as needed   pantoprazole (PROTONIX) 40 MG tablet Take 40 mg by mouth daily.   PARoxetine (PAXIL) 30 MG tablet Take 30 mg by mouth daily.   Polyethyl Glycol-Propyl Glycol 0.4-0.3 % SOLN Apply to eye 2 (two) times daily as needed.   polyethylene glycol (MIRALAX / GLYCOLAX) 17 g packet Take 17 g by mouth daily as needed.   PRADAXA 150 MG CAPS capsule TAKE 1 CAPSULE BY MOUTH TWICE DAILY START TREATMENT 10 HRS LAST DOSE OF LOVENOX   SUNSCREEN SPF50 EX Apply topically. Apply to exposed skin topically every 2 hours as needed for Sunburn Prevention   tiZANidine (ZANAFLEX) 2 MG tablet Take 2 mg by mouth every 12 (twelve) hours as needed for muscle spasms.   [DISCONTINUED] metoprolol tartrate (LOPRESSOR) 50 MG tablet Take 25 mg by mouth 2 (two) times daily.   triamcinolone cream (KENALOG) 0.1 % Apply 1 Application topically as directed.  Apply to Back topically every day and evening shift for Rash/Itching (Patient not taking: Reported on 07/02/2023)   [DISCONTINUED] hydrocortisone cream 1 % Apply to rectum topically every 6 hours as needed   No facility-administered encounter medications on file as of 07/02/2023.    Review of Systems  Constitutional:  Negative for chills and fever.  HENT:  Negative for tinnitus.   Respiratory:  Negative for cough and shortness of breath.   Cardiovascular:  Negative for chest pain, palpitations and leg swelling.   Gastrointestinal:  Negative for abdominal pain, constipation and diarrhea.  Genitourinary:  Negative for dysuria, frequency and urgency.  Musculoskeletal:  Positive for arthralgias. Negative for back pain and myalgias.  Skin: Negative.   Neurological:  Negative for dizziness and headaches.     Immunization History  Administered Date(s) Administered   Influenza, High Dose Seasonal PF 03/07/2023   Influenza-Unspecified 02/03/2020, 02/18/2021, 02/18/2022   Moderna Covid-19 Fall Seasonal Vaccine 49yrs & older 08/12/2022   PNEUMOCOCCAL CONJUGATE-20 01/17/2023   Pneumococcal Conjugate-13 09/28/2017   Tdap 12/31/2022   Unspecified SARS-COV-2 Vaccination 05/20/2019, 05/20/2019, 06/17/2019, 03/16/2020, 09/20/2020, 01/25/2021, 01/30/2023, 03/07/2023   Pertinent  Health Maintenance Due  Topic Date Due   HEMOGLOBIN A1C  09/17/2023   OPHTHALMOLOGY EXAM  10/30/2023   FOOT EXAM  12/11/2023   INFLUENZA VACCINE  Completed   DEXA SCAN  Completed      12/18/2021    7:45 PM 12/19/2021    7:00 AM 12/19/2021   12:00 PM 01/24/2022   11:20 AM 12/11/2022    1:31 PM  Fall Risk  Falls in the past year?     0  (RETIRED) Patient Fall Risk Level High fall risk High fall risk High fall risk High fall risk    Functional Status Survey:    Vitals:   07/02/23 0833  BP: 134/60  Pulse: (!) 59  Resp: 20  Temp: (!) 97.5 F (36.4 C)  SpO2: 90%  Weight: 162 lb 3.2 oz (73.6 kg)  Height: 5\' 3"  (1.6 m)   Body mass index is 28.73 kg/m. Physical Exam Constitutional:      General: She is not in acute distress.    Appearance: She is well-developed. She is not diaphoretic.  HENT:  Head: Normocephalic and atraumatic.     Mouth/Throat:     Pharynx: No oropharyngeal exudate.  Eyes:     Conjunctiva/sclera: Conjunctivae normal.     Pupils: Pupils are equal, round, and reactive to light.  Cardiovascular:     Rate and Rhythm: Normal rate and regular rhythm.     Heart sounds: Normal heart sounds.  Pulmonary:      Effort: Pulmonary effort is normal.     Breath sounds: Normal breath sounds.  Abdominal:     General: Bowel sounds are normal.     Palpations: Abdomen is soft.  Musculoskeletal:     Cervical back: Normal range of motion and neck supple.     Right lower leg: No edema.  Skin:    General: Skin is warm and dry.  Neurological:     Mental Status: She is alert. Mental status is at baseline.     Gait: Gait abnormal.  Psychiatric:        Mood and Affect: Mood normal.     Labs reviewed: Recent Labs    03/28/23 0426 03/29/23 0414 03/30/23 0642 04/06/23 0000  NA 138 138 137 138  K 4.2 4.4 5.5* 5.4*  CL 101 103 103 101  CO2 29 27 25  29*  GLUCOSE 177* 173* 202*  --   BUN 11 10 8 16   CREATININE 0.49 0.50 0.62 0.6  CALCIUM 8.7* 9.1 9.3 10.0  MG 1.5* 1.9 1.7  --   PHOS 2.5 1.9* 2.4*  --    Recent Labs    03/21/23 0502 03/22/23 0500 03/23/23 0507  AST 18 15 22   ALT 10 12 14   ALKPHOS 45 46 53  BILITOT 0.4 <0.2 0.4  PROT 5.7* 5.9* 6.3*  ALBUMIN 2.8* 2.7* 2.8*   Recent Labs    03/09/23 0000 03/19/23 1849 03/21/23 0502 03/27/23 0545 03/28/23 0426 03/29/23 0414 04/06/23 0000  WBC 7.8 13.1*   < > 11.5* 11.0* 10.1 10.3  NEUTROABS 4,984.00 9.8*  --   --   --   --  7,025.00  HGB 11.8* 12.6   < > 11.2* 10.6* 10.9* 11.3*  HCT 37 40.5   < > 35.5* 34.1* 35.1* 36  MCV  --  91.4   < > 91.0 90.9 92.9  --   PLT 180 244   < > 272 278 312 264   < > = values in this interval not displayed.   Lab Results  Component Value Date   TSH 3.77 04/16/2023   Lab Results  Component Value Date   HGBA1C 6.5 (H) 03/20/2023   Lab Results  Component Value Date   CHOL 110 12/15/2021   HDL 19 (L) 12/15/2021   LDLCALC 46 12/15/2021   TRIG 223 (H) 12/15/2021   CHOLHDL 5.8 12/15/2021    Significant Diagnostic Results in last 30 days:  No results found.  Assessment/Plan No problem-specific Assessment & Plan notes found for this encounter. Atrial Fibrillation Stable, no reported  palpitations. On Pradaxa for anticoagulation. -Continue Pradaxa as prescribed. Continue metoprolol for rate control.   Type 2 Diabetes Mellitus Last A1c was 6.5 in November, indicating good control. No reported hypoglycemic symptoms. -Continue Metformin 750mg  BID and Glipizide 2.5mg  daily.  Right Knee Osteoarthritis Reports occasional pain. Using Voltaren gel. -Continue Voltaren gel, can use every 6 hours as needed for pain.  Depression Stable, no reported symptoms of anxiety or depression. On Paxil. -Continue Paxil as prescribed.  Hyperthyroidism Last TSH was normal in December. Managed by endocrinologist and  on Tapazole. -Continue Tapazole 10mg  daily.  Gastroesophageal Reflux Disease (GERD) No reported symptoms. On Protonix. -Continue Protonix 40mg  daily.  Hyperkalemia Last potassium level was high. Patient has modified diet to avoid high potassium foods. -Recheck potassium level with next labs.  HTN -blood pressure controlled -Continue Lopressor BID for blood pressure control.  Constipation  controlled -Continue current regimen for constipation management.    Janene Harvey. Biagio Borg [facility] [phone]

## 2023-07-03 DIAGNOSIS — R278 Other lack of coordination: Secondary | ICD-10-CM | POA: Diagnosis not present

## 2023-07-03 DIAGNOSIS — N390 Urinary tract infection, site not specified: Secondary | ICD-10-CM | POA: Diagnosis not present

## 2023-07-03 DIAGNOSIS — M6281 Muscle weakness (generalized): Secondary | ICD-10-CM | POA: Diagnosis not present

## 2023-07-03 DIAGNOSIS — E1142 Type 2 diabetes mellitus with diabetic polyneuropathy: Secondary | ICD-10-CM | POA: Diagnosis not present

## 2023-07-03 DIAGNOSIS — Z51A Encounter for sepsis aftercare: Secondary | ICD-10-CM | POA: Diagnosis not present

## 2023-07-03 DIAGNOSIS — J9601 Acute respiratory failure with hypoxia: Secondary | ICD-10-CM | POA: Diagnosis not present

## 2023-07-03 DIAGNOSIS — I739 Peripheral vascular disease, unspecified: Secondary | ICD-10-CM | POA: Diagnosis not present

## 2023-07-06 DIAGNOSIS — N390 Urinary tract infection, site not specified: Secondary | ICD-10-CM | POA: Diagnosis not present

## 2023-07-06 DIAGNOSIS — M6281 Muscle weakness (generalized): Secondary | ICD-10-CM | POA: Diagnosis not present

## 2023-07-06 DIAGNOSIS — I739 Peripheral vascular disease, unspecified: Secondary | ICD-10-CM | POA: Diagnosis not present

## 2023-07-06 DIAGNOSIS — E875 Hyperkalemia: Secondary | ICD-10-CM | POA: Diagnosis not present

## 2023-07-06 DIAGNOSIS — J9601 Acute respiratory failure with hypoxia: Secondary | ICD-10-CM | POA: Diagnosis not present

## 2023-07-06 DIAGNOSIS — E1142 Type 2 diabetes mellitus with diabetic polyneuropathy: Secondary | ICD-10-CM | POA: Diagnosis not present

## 2023-07-06 DIAGNOSIS — Z51A Encounter for sepsis aftercare: Secondary | ICD-10-CM | POA: Diagnosis not present

## 2023-07-06 DIAGNOSIS — R278 Other lack of coordination: Secondary | ICD-10-CM | POA: Diagnosis not present

## 2023-07-06 LAB — BASIC METABOLIC PANEL WITH GFR
BUN: 14 (ref 4–21)
CO2: 28 — AB (ref 13–22)
Chloride: 103 (ref 99–108)
Creatinine: 0.5 (ref 0.5–1.1)
Glucose: 156
Potassium: 4.7 meq/L (ref 3.5–5.1)
Sodium: 137 (ref 137–147)

## 2023-07-06 LAB — COMPREHENSIVE METABOLIC PANEL WITH GFR: Calcium: 10.9 — AB (ref 8.7–10.7)

## 2023-07-10 DIAGNOSIS — N390 Urinary tract infection, site not specified: Secondary | ICD-10-CM | POA: Diagnosis not present

## 2023-07-10 DIAGNOSIS — J9601 Acute respiratory failure with hypoxia: Secondary | ICD-10-CM | POA: Diagnosis not present

## 2023-07-10 DIAGNOSIS — M6281 Muscle weakness (generalized): Secondary | ICD-10-CM | POA: Diagnosis not present

## 2023-07-10 DIAGNOSIS — I739 Peripheral vascular disease, unspecified: Secondary | ICD-10-CM | POA: Diagnosis not present

## 2023-07-10 DIAGNOSIS — E1142 Type 2 diabetes mellitus with diabetic polyneuropathy: Secondary | ICD-10-CM | POA: Diagnosis not present

## 2023-07-10 DIAGNOSIS — R278 Other lack of coordination: Secondary | ICD-10-CM | POA: Diagnosis not present

## 2023-07-10 DIAGNOSIS — Z51A Encounter for sepsis aftercare: Secondary | ICD-10-CM | POA: Diagnosis not present

## 2023-07-14 DIAGNOSIS — E1142 Type 2 diabetes mellitus with diabetic polyneuropathy: Secondary | ICD-10-CM | POA: Diagnosis not present

## 2023-07-14 DIAGNOSIS — Z51A Encounter for sepsis aftercare: Secondary | ICD-10-CM | POA: Diagnosis not present

## 2023-07-14 DIAGNOSIS — R278 Other lack of coordination: Secondary | ICD-10-CM | POA: Diagnosis not present

## 2023-07-14 DIAGNOSIS — J9601 Acute respiratory failure with hypoxia: Secondary | ICD-10-CM | POA: Diagnosis not present

## 2023-07-14 DIAGNOSIS — M6281 Muscle weakness (generalized): Secondary | ICD-10-CM | POA: Diagnosis not present

## 2023-07-14 DIAGNOSIS — N390 Urinary tract infection, site not specified: Secondary | ICD-10-CM | POA: Diagnosis not present

## 2023-07-14 DIAGNOSIS — I739 Peripheral vascular disease, unspecified: Secondary | ICD-10-CM | POA: Diagnosis not present

## 2023-07-20 DIAGNOSIS — R278 Other lack of coordination: Secondary | ICD-10-CM | POA: Diagnosis not present

## 2023-07-20 DIAGNOSIS — N390 Urinary tract infection, site not specified: Secondary | ICD-10-CM | POA: Diagnosis not present

## 2023-07-20 DIAGNOSIS — I739 Peripheral vascular disease, unspecified: Secondary | ICD-10-CM | POA: Diagnosis not present

## 2023-07-20 DIAGNOSIS — E1142 Type 2 diabetes mellitus with diabetic polyneuropathy: Secondary | ICD-10-CM | POA: Diagnosis not present

## 2023-07-20 DIAGNOSIS — Z51A Encounter for sepsis aftercare: Secondary | ICD-10-CM | POA: Diagnosis not present

## 2023-07-20 DIAGNOSIS — M6281 Muscle weakness (generalized): Secondary | ICD-10-CM | POA: Diagnosis not present

## 2023-07-20 DIAGNOSIS — J9601 Acute respiratory failure with hypoxia: Secondary | ICD-10-CM | POA: Diagnosis not present

## 2023-07-29 DIAGNOSIS — H4912 Fourth [trochlear] nerve palsy, left eye: Secondary | ICD-10-CM | POA: Diagnosis not present

## 2023-08-03 DIAGNOSIS — L814 Other melanin hyperpigmentation: Secondary | ICD-10-CM | POA: Diagnosis not present

## 2023-08-03 DIAGNOSIS — L821 Other seborrheic keratosis: Secondary | ICD-10-CM | POA: Diagnosis not present

## 2023-08-03 DIAGNOSIS — L905 Scar conditions and fibrosis of skin: Secondary | ICD-10-CM | POA: Diagnosis not present

## 2023-08-04 ENCOUNTER — Inpatient Hospital Stay: Payer: Medicare Other | Admitting: Oncology

## 2023-08-04 ENCOUNTER — Encounter: Payer: Self-pay | Admitting: Oncology

## 2023-08-04 ENCOUNTER — Inpatient Hospital Stay: Payer: Medicare Other

## 2023-08-04 ENCOUNTER — Inpatient Hospital Stay: Payer: Medicare Other | Attending: Oncology

## 2023-08-04 VITALS — BP 123/52 | HR 50 | Temp 97.6°F | Resp 18

## 2023-08-04 DIAGNOSIS — M810 Age-related osteoporosis without current pathological fracture: Secondary | ICD-10-CM

## 2023-08-04 DIAGNOSIS — C50412 Malignant neoplasm of upper-outer quadrant of left female breast: Secondary | ICD-10-CM | POA: Diagnosis not present

## 2023-08-04 DIAGNOSIS — Z79899 Other long term (current) drug therapy: Secondary | ICD-10-CM | POA: Diagnosis not present

## 2023-08-04 DIAGNOSIS — Z170 Estrogen receptor positive status [ER+]: Secondary | ICD-10-CM | POA: Insufficient documentation

## 2023-08-04 DIAGNOSIS — Z923 Personal history of irradiation: Secondary | ICD-10-CM | POA: Insufficient documentation

## 2023-08-04 DIAGNOSIS — Z1732 Human epidermal growth factor receptor 2 negative status: Secondary | ICD-10-CM | POA: Insufficient documentation

## 2023-08-04 DIAGNOSIS — Z7902 Long term (current) use of antithrombotics/antiplatelets: Secondary | ICD-10-CM | POA: Insufficient documentation

## 2023-08-04 DIAGNOSIS — Z1721 Progesterone receptor positive status: Secondary | ICD-10-CM | POA: Diagnosis not present

## 2023-08-04 DIAGNOSIS — D6851 Activated protein C resistance: Secondary | ICD-10-CM | POA: Insufficient documentation

## 2023-08-04 LAB — BASIC METABOLIC PANEL WITH GFR
Anion gap: 10 (ref 5–15)
BUN: 14 mg/dL (ref 8–23)
CO2: 25 mmol/L (ref 22–32)
Calcium: 11 mg/dL — ABNORMAL HIGH (ref 8.9–10.3)
Chloride: 102 mmol/L (ref 98–111)
Creatinine, Ser: 0.71 mg/dL (ref 0.44–1.00)
GFR, Estimated: >60 mL/min (ref >60)
Glucose, Bld: 191 mg/dL — ABNORMAL HIGH (ref 70–99)
Potassium: 4 mmol/L (ref 3.5–5.1)
Sodium: 137 mmol/L (ref 135–145)

## 2023-08-04 LAB — CBC WITH DIFFERENTIAL/PLATELET
Abs Immature Granulocytes: 0.08 10*3/uL — ABNORMAL HIGH (ref 0.00–0.07)
Basophils Absolute: 0.1 10*3/uL (ref 0.0–0.1)
Basophils Relative: 1 %
Eosinophils Absolute: 0.2 10*3/uL (ref 0.0–0.5)
Eosinophils Relative: 2 %
HCT: 41.4 % (ref 36.0–46.0)
Hemoglobin: 12.7 g/dL (ref 12.0–15.0)
Immature Granulocytes: 1 %
Lymphocytes Relative: 22 %
Lymphs Abs: 2.3 10*3/uL (ref 0.7–4.0)
MCH: 26.2 pg (ref 26.0–34.0)
MCHC: 30.7 g/dL (ref 30.0–36.0)
MCV: 85.4 fL (ref 80.0–100.0)
Monocytes Absolute: 0.7 10*3/uL (ref 0.1–1.0)
Monocytes Relative: 6 %
Neutro Abs: 7.2 10*3/uL (ref 1.7–7.7)
Neutrophils Relative %: 68 %
Platelets: 241 10*3/uL (ref 150–400)
RBC: 4.85 MIL/uL (ref 3.87–5.11)
RDW: 17.2 % — ABNORMAL HIGH (ref 11.5–15.5)
WBC: 10.5 10*3/uL (ref 4.0–10.5)
nRBC: 0 % (ref 0.0–0.2)

## 2023-08-04 MED ORDER — DENOSUMAB 60 MG/ML ~~LOC~~ SOSY
60.0000 mg | PREFILLED_SYRINGE | Freq: Once | SUBCUTANEOUS | Status: AC
Start: 2023-08-04 — End: 2023-08-04
  Administered 2023-08-04: 60 mg via SUBCUTANEOUS
  Filled 2023-08-04: qty 1

## 2023-08-04 NOTE — Progress Notes (Signed)
 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  Oncotype DX score 1.  Homozygous factor V Leiden.  INTERVAL HISTORY: Patient returns to clinic today for routine 36-month evaluation and continuation of Prolia.  She continues to feel well and at her baseline.  She has worsening memory.  She continues to tolerate Pradaxa without significant side effects. She has no neurologic complaints.  She denies any recent fevers or illnesses.  She has a good appetite and denies weight loss.  She denies any chest pain, shortness of breath, cough, or hemoptysis.  She denies any nausea, vomiting, constipation, or diarrhea.  She has no urinary complaints.  Patient offers no further specific complaints today.  REVIEW OF SYSTEMS:   Review of Systems  Constitutional: Negative.  Negative for fever, malaise/fatigue and weight loss.  Respiratory: Negative.  Negative for cough and shortness of breath.   Cardiovascular: Negative.  Negative for chest pain and leg swelling.  Gastrointestinal: Negative.  Negative for abdominal pain.  Genitourinary:  Negative for dysuria and flank pain.  Musculoskeletal:  Negative for falls and joint pain.  Skin: Negative.  Negative for rash.  Neurological: Negative.  Negative for dizziness, sensory change, weakness and headaches.  Psychiatric/Behavioral:  Positive for memory loss. The patient is not nervous/anxious.     As per HPI. Otherwise, a complete review of systems is negative.   PAST MEDICAL HISTORY: Reviewed and unchanged.  PAST SURGICAL HISTORY: Past Surgical  History:  Procedure Laterality Date   APPENDECTOMY  1962   BREAST BIOPSY Right 2006?   benign   BREAST BIOPSY Left 2019   invasive mammary carcinoma   BREAST CYST EXCISION Left 07/27/2017   Procedure: SKIN CYST EXCISED;  Surgeon: Earline Mayotte, MD;  Location: [facility] ORS;  Service: General;  Laterality: Left;   BREAST LUMPECTOMY Left 07/2017   invasive mammary, DCIS  mammosite   BREAST SURGERY Right 2019   CARPAL TUNNEL RELEASE Right    CATARACT EXTRACTION W/PHACO Left 11/11/2016   Procedure: CATARACT EXTRACTION PHACO AND INTRAOCULAR LENS PLACEMENT (IOC);  Surgeon: Galen Manila, MD;  Location: [facility] ORS;  Service: Ophthalmology;  Laterality: Left;  [place] 01:07.9 AP% 19.2 CDE 13.02 Fluid pack lot # [id number]   CATARACT EXTRACTION W/PHACO Right 12/09/2016   Procedure: CATARACT EXTRACTION PHACO AND INTRAOCULAR LENS PLACEMENT (IOC);  Surgeon: Galen Manila, MD;  Location: [facility] ORS;  Service: Ophthalmology;  Laterality: Right;  [place] 00:49 AP% 21.2 CDE 10.39 Fluid pack lot # [id number]   CHOLECYSTECTOMY     COLONOSCOPY WITH PROPOFOL N/A 11/22/2014   Procedure: COLONOSCOPY WITH PROPOFOL;  Surgeon: Scot Jun, MD;  Location[facility] ENDOSCOPY;  Service: Endoscopy;  Laterality: N/A;   CYSTOSCOPY WITH STENT PLACEMENT Bilateral 11/27/2018   Procedure: CYSTOSCOPY WITH STENT PLACEMENT;  Surgeon: Jerilee Field, MD;  Location: [facility] ORS;  Service: Urology;  Laterality: Bilateral;   CYSTOSCOPY/URETEROSCOPY/HOLMIUM LASER/STENT PLACEMENT Bilateral 12/17/2018   Procedure: CYSTOSCOPY/URETEROSCOPY/HOLMIUM LASER/STENT Exchange;  Surgeon: Sondra Come, MD;  Location: [facility] ORS;  Service: Urology;  Laterality: Bilateral;   ESOPHAGOGASTRODUODENOSCOPY  11/22/2014   Procedure: ESOPHAGOGASTRODUODENOSCOPY (EGD);  Surgeon: Scot Jun, MD;  Location: [facility] ENDOSCOPY;  Service: Endoscopy;;  ESOPHAGOGASTRODUODENOSCOPY (EGD) WITH PROPOFOL N/A 03/05/2017   Procedure: ESOPHAGOGASTRODUODENOSCOPY (EGD) WITH  PROPOFOL;  Surgeon: Toney Reil, MD;  Location: [facility] ENDOSCOPY;  Service: Gastroenterology;  Laterality: N/A;   EXTRACORPOREAL SHOCK WAVE LITHOTRIPSY Right 03/04/2018   Procedure: EXTRACORPOREAL SHOCK WAVE LITHOTRIPSY (ESWL);  Surgeon: Sondra Come, MD;  Location: [facility] ORS;  Service: Urology;  Laterality: Right;   EYE SURGERY Bilateral 2012   cataract extraction with iol   GALLBLADDER SURGERY  1968   hemi pelvectomy     left side at age 89   HEMIPELVIC Left 1962   LEG SURGERY Left    AMPUTATION d/t cancer at 89 years old   MASTECTOMY, PARTIAL Left 07/27/2017   Procedure: MASTECTOMY PARTIAL;  Surgeon: Earline Mayotte, MD;  Location: [facility] ORS;  Service: General;  Laterality: Left;   MOUTH SURGERY  2019   teeth pulled with a bridge insertion.   fell out 12/16/18   OVARY SURGERY Right    cyst removed   SAVORY DILATION  11/22/2014   Procedure: SAVORY DILATION;  Surgeon: Scot Jun, MD;  Location: [facility] ENDOSCOPY;  Service: Endoscopy;;   SENTINEL NODE BIOPSY Left 07/27/2017   Procedure: SENTINEL NODE BIOPSY;  Surgeon: Earline Mayotte, MD;  Location: [facility] ORS;  Service: General;  Laterality: Left;   SKIN CANCER EXCISION     nose and arm and face   TEE WITHOUT CARDIOVERSION N/A 07/25/2019   Procedure: TRANSESOPHAGEAL ECHOCARDIOGRAM (TEE);  Surgeon: Lamar Blinks, MD;  Location: [facility] ORS;  Service: Cardiovascular;  Laterality: N/A;   TONSILLECTOMY      FAMILY HISTORY: Family History  Problem Relation Age of Onset   Breast cancer Paternal Aunt    Ovarian cancer Sister    Prostate cancer Father    Stroke Mother    Kidney cancer Neg Hx    Bladder Cancer Neg Hx     ADVANCED DIRECTIVES (Y/N):  N  HEALTH MAINTENANCE: Social History   Tobacco Use   Smoking status: Never   Smokeless tobacco: Never  Vaping Use   Vaping status: Never Used  Substance Use Topics   Alcohol use: No   Drug use: No     Colonoscopy:  PAP:  Bone density:  Lipid panel:  Allergies   Allergen Reactions   Ciprofloxacin Hives   Codeine Other (See Comments)    HALLUCINATIONS   Tape     Rash and skin irritation/ paper tape and tegaderm OK   Ciprocinonide [Fluocinolone] Other (See Comments)    unknown   Amoxicillin Other (See Comments)    Upset stomach Has patient had a PCN reaction causing immediate rash, facial/tongue/throat swelling, SOB or lightheadedness with hypotension: No Has patient had a PCN reaction causing severe rash involving mucus membranes or skin necrosis: No Has patient had a PCN reaction that required hospitalization: No Has patient had a PCN reaction occurring within the last 10 years: Yes If all of the above answers are "NO", then may proceed with Cephalosporin use.;   Cefuroxime Other (See Comments)    OK INTRACAMERALLY PER DR WLP, upset stomach   Latex Rash    Rast test NEGATIVE   Lipitor [Atorvastatin] Other (See Comments)    unknown    Current Outpatient Medications  Medication Sig Dispense Refill   acetaminophen (TYLENOL) 650 MG CR tablet Take 650 mg by mouth every 8 (eight) hours as needed for pain.     Cholecalciferol (VITAMIN D-1000 MAX ST) 25 MCG (1000 UT) tablet Take 1,000 Units by mouth  daily.     dextromethorphan-guaiFENesin (ROBITUSSIN-DM) 10-100 MG/5ML liquid Take 10 mLs by mouth every 4 (four) hours as needed for cough.     diclofenac Sodium (VOLTAREN) 1 % GEL Apply 4 g topically. Apply to stump topically every 6 hours as needed. Apply to right knee topically three times daily for pain.     fluticasone (FLONASE) 50 MCG/ACT nasal spray Place 2 sprays into both nostrils 2 (two) times daily.     gabapentin (NEURONTIN) 100 MG capsule Take 100 mg by mouth 3 (three) times daily.     GLIPIZIDE XL 2.5 MG 24 hr tablet Take 2.5 mg by mouth daily.      hydrocortisone 2.5 % cream Apply to Hemorrhoids/buttucks topically every 8 hours as needed for hemorrhoids.     ipratropium-albuterol (DUONEB) 0.5-2.5 (3) MG/3ML SOLN Take 3 mLs by  nebulization every 4 (four) hours as needed.     loratadine (CLARITIN) 10 MG tablet Take 10 mg by mouth daily.     melatonin 3 MG TABS tablet Take 3 mg by mouth at bedtime.     metFORMIN (GLUCOPHAGE) 500 MG tablet Take 750 mg by mouth 2 (two) times daily with a meal.     methimazole (TAPAZOLE) 10 MG tablet Take 1 tablet by mouth daily.     metoprolol tartrate (LOPRESSOR) 25 MG tablet Take 25 mg by mouth 2 (two) times daily.     ondansetron (ZOFRAN) 4 MG tablet Take 4 mg by mouth every 4 (four) hours as needed for nausea or vomiting.     OXYGEN 2lpm as needed for sats less than 88% every 1 hour as needed     pantoprazole (PROTONIX) 40 MG tablet Take 40 mg by mouth daily.     PARoxetine (PAXIL) 30 MG tablet Take 30 mg by mouth daily.     Polyethyl Glycol-Propyl Glycol 0.4-0.3 % SOLN Apply to eye 2 (two) times daily as needed.     polyethylene glycol (MIRALAX / GLYCOLAX) 17 g packet Take 17 g by mouth daily as needed.     PRADAXA 150 MG CAPS capsule TAKE 1 CAPSULE BY MOUTH TWICE DAILY START TREATMENT 10 HRS LAST DOSE OF LOVENOX 60 capsule 2   SUNSCREEN SPF50 EX Apply topically. Apply to exposed skin topically every 2 hours as needed for Sunburn Prevention     tiZANidine (ZANAFLEX) 2 MG tablet Take 2 mg by mouth every 12 (twelve) hours as needed for muscle spasms.     No current facility-administered medications for this visit.    OBJECTIVE: Vitals:   08/04/23 1025  BP: (!) 123/52  Pulse: (!) 50  Resp: 18  Temp: 97.6 F (36.4 C)  SpO2: 98%       There is no height or weight on file to calculate BMI.    ECOG FS:2 - Symptomatic, <50% confined to bed  General: Well-developed, well-nourished, no acute distress.  Sitting in a wheelchair. Eyes: Pink conjunctiva, anicteric sclera. HEENT: Normocephalic, moist mucous membranes. Lungs: No audible wheezing or coughing. Heart: Regular rate and rhythm. Abdomen: Soft, nontender, no obvious distention. Musculoskeletal: No edema, cyanosis, or  clubbing. Neuro: Alert, answering all questions appropriately. Cranial nerves grossly intact. Skin: No rashes or petechiae noted. Psych: Normal affect.   LAB RESULTS:  Lab Results  Component Value Date   NA 137 08/04/2023   K 4.0 08/04/2023   CL 102 08/04/2023   CO2 25 08/04/2023   GLUCOSE 191 (H) 08/04/2023   BUN 14 08/04/2023   CREATININE 0.71 08/04/2023  CALCIUM 11.0 (H) 08/04/2023   PROT 6.3 (L) 03/23/2023   ALBUMIN 2.8 (L) 03/23/2023   AST 22 03/23/2023   ALT 14 03/23/2023   ALKPHOS 53 03/23/2023   BILITOT 0.4 03/23/2023   GFRNONAA >60 08/04/2023   GFRAA >60 01/24/2020    Lab Results  Component Value Date   WBC 10.5 08/04/2023   NEUTROABS 7.2 08/04/2023   HGB 12.7 08/04/2023   HCT 41.4 08/04/2023   MCV 85.4 08/04/2023   PLT 241 08/04/2023     STUDIES: No results found.  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  Homozygous factor V Leiden.  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  Patient had a lumpectomy on July 27, 2017.  She completed radiation with MammoSite treatment.  Patient completed 5 years of letrozole in approximately March 2024.  Her most recent mammogram on April 14, 2023 was reported as BI-RADS 1.  Repeat in December 2025.   Osteoporosis: Patient's most recent bone mineral density on April 14, 2023 reported T-score of -3.6.  This is unchanged from previous.  Proceed with Prolia as scheduled.  Return to clinic in 1 month for laboratory work only and then in 6 months for laboratory work and continuation of treatment.  Repeat bone mineral density in December 2025.   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 Homozygous factor V Leiden: Patient had multiple splenic infarcts suspicious for thrombosis while taking Xarelto.  Subsequent hypercoagulable work-up revealed the patient is homozygous for factor V Leiden.  Continue Pradaxa 150 mg every 12 hours.  She will require lifelong anticoagulation.   Hypercalcemia: Unclear etiology.  Proceed with Xgeva as above.  Repeat laboratory work in 1 month.    Patient expressed understanding and was in agreement with this plan. She also understands that She can call clinic at any time with any questions, concerns, or complaints.    Cancer Staging  Primary cancer of upper outer quadrant of left female breast [facility]) Staging form: Breast, AJCC 8th Edition - Clinical stage from 06/12/2017: Stage IA (cT1b, cN0, cM0, G2, ER+, PR+, HER2-) - Signed by Jeralyn Ruths, MD on 06/12/2017 Histologic grading system: 3 grade system Laterality: Left   Jeralyn Ruths, MD   08/04/2023 2:14 PM

## 2023-08-17 DIAGNOSIS — E0500 Thyrotoxicosis with diffuse goiter without thyrotoxic crisis or storm: Secondary | ICD-10-CM | POA: Diagnosis not present

## 2023-08-17 DIAGNOSIS — E210 Primary hyperparathyroidism: Secondary | ICD-10-CM | POA: Diagnosis not present

## 2023-08-17 DIAGNOSIS — M810 Age-related osteoporosis without current pathological fracture: Secondary | ICD-10-CM | POA: Diagnosis not present

## 2023-08-19 ENCOUNTER — Encounter: Payer: Self-pay | Admitting: Student

## 2023-08-19 ENCOUNTER — Non-Acute Institutional Stay (SKILLED_NURSING_FACILITY): Payer: Self-pay | Admitting: Student

## 2023-08-19 DIAGNOSIS — J312 Chronic pharyngitis: Secondary | ICD-10-CM

## 2023-08-19 DIAGNOSIS — E1169 Type 2 diabetes mellitus with other specified complication: Secondary | ICD-10-CM

## 2023-08-19 DIAGNOSIS — M1711 Unilateral primary osteoarthritis, right knee: Secondary | ICD-10-CM

## 2023-08-19 DIAGNOSIS — K5904 Chronic idiopathic constipation: Secondary | ICD-10-CM

## 2023-08-19 DIAGNOSIS — S88912S Complete traumatic amputation of left lower leg, level unspecified, sequela: Secondary | ICD-10-CM

## 2023-08-19 NOTE — Progress Notes (Signed)
 [facility] Room Number: 215 A Place of Service:  SNF [phone]) Provider:  Valrie Gehrig, MD  Patient Care Team: Valrie Gehrig, MD as PCP - General (Family Medicine) Marquita Situ Magali Schmitz, MD (General Surgery) Shellie Dials, MD as Consulting Physician (Oncology)  Extended Emergency Contact Information Primary Emergency Contact: Donne Gage Address: [address]          [place], [place] [address] [place] Home Phone: [phone] Mobile Phone: [phone] Relation: Daughter  Code Status:  DNR Goals of care: Advanced Directive information    08/04/2023   10:24 AM  Advanced Directives  Does Patient Have a Medical Advance Directive? Yes  Type of Advance Directive Living will;Healthcare Power of Attorney  Does patient want to make changes to medical advance directive? Yes (ED - Information included in AVS)     Chief Complaint  Patient presents with   Medical Management of Chronic Issues    Routine visit     HPI:  Pt is a 89 y.o. female seen today for medical management of chronic diseases.   History of Present Illness The patient presents with a persistent sore throat and skin concerns.  She has been experiencing a persistent sore throat, described as occurring 'practically all the time.' She has been using a significant amount of cough drops to manage the symptoms and recently started using Flonase  nasal spray, which has provided some relief. The sore throat may be related to allergy season.  She has a history of skin cancer since high school and is currently concerned about brown marks on her skin, which resemble sunspots. These marks have been evaluated by a dermatologist, and she is awaiting a follow-up visit in the next week.  She has a history of thyroid  issues and notes that her endocrinologist has recently stopped her methimazole . She is scheduled to have her thyroid  levels rechecked in two months.  She is trying to  lose weight due to diabetes and hopes to eventually put weight on her right leg to avoid using a lift for mobility.  She reports changes in bowel habits, noting less frequent and smaller bowel movements, sometimes going a day or two without one. She is not currently taking any medication for constipation.  She recalls being on potassium supplements when she first arrived at her current residence but is no longer taking them. She has been advised to avoid high-potassium foods like bananas and V8 juice.   Past Medical History:  Diagnosis Date   Anxiety    Benign breast lumps    Blood clotting disorder (HCC)    Bone cancer (HCC)    head of femur, left leg ... 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  Oncotype recurrence score: 1.  DCIS, margin less than 0.5 mm.  Negative sentinel node.  Accelerated partial breast radiation.   Cervicalgia    Chronic kidney disease    kidney stones   Colon cancer [facility])    patient unaware of this   Complication of anesthesia    mood alteration / not sure if d/t pain medicine or anesthesia as she passed out    Cough    NASAL DRIP / SNEEZING / SORE THROAT MOSTLY CONSTANT   Depression    Diabetes (HCC)    Diverticulitis    Dizzy    GERD (gastroesophageal reflux disease)  H/O blood clots    arm and leg   HBP (high blood pressure)    Hematuria    gross   Hemorrhoids    HLD (hyperlipidemia)    Microscopic hematuria 06/02/2015   Muscle pain    Osteoarthritis    Osteoporosis    Panic disorder    Personal history of radiation therapy    PND (post-nasal drip)    CHRONIC WITH SORE THROAT AND SNEEZING   Reflux    Sepsis (HCC)    Sepsis due to pneumonia (HCC) 03/20/2023   Sepsis, unspecified organism (HCC) 12/15/2021   Formatting of this note might be different from the original.  Last Assessment & Plan: Formatting of  this note might be different from the original. Appears to be multifactorial and secondary to healthcare associated pneumonia as well as a UTI CT angio shows asymmetric airspace disease at the left base and posteriorly in the right upper lobe concerning for pneumonia Patient also has pyuria Patient   Severe sepsis (HCC) 12/15/2021   Skin cancer    nose   Swelling    Tremor    Tremors of nervous system    Ureteral stone with hydronephrosis 11/26/2018   Past Surgical History:  Procedure Laterality Date   APPENDECTOMY  1962   BREAST BIOPSY Right 2006?   benign   BREAST BIOPSY Left 2019   invasive mammary carcinoma   BREAST CYST EXCISION Left 07/27/2017   Procedure: SKIN CYST EXCISED;  Surgeon: Marshall Skeeter, MD;  Location: [facility] ORS;  Service: General;  Laterality: Left;   BREAST LUMPECTOMY Left 07/2017   invasive mammary, DCIS  mammosite   BREAST SURGERY Right 2019   CARPAL TUNNEL RELEASE Right    CATARACT EXTRACTION W/PHACO Left 11/11/2016   Procedure: CATARACT EXTRACTION PHACO AND INTRAOCULAR LENS PLACEMENT (IOC);  Surgeon: Clair Crews, MD;  Location: [facility] ORS;  Service: Ophthalmology;  Laterality: Left;  US  01:07.9 AP% 19.2 CDE 13.02 Fluid pack lot # [id number]   CATARACT EXTRACTION W/PHACO Right 12/09/2016   Procedure: CATARACT EXTRACTION PHACO AND INTRAOCULAR LENS PLACEMENT (IOC);  Surgeon: Clair Crews, MD;  Location: [facility] OR[facility];  Service: Ophthalmology;  Laterality: Right;  US  00:49 AP% 21.2 CDE 10.39 Fluid pack lot # [id number]   CHOLECYSTECTOMY     COLONOSCOPY WITH PROPOFOL  N/A 11/22/2014   Procedure: COLONOSCOPY WITH PROPOFOL ;  Surgeon: Cassie Click, MD;  Location: [facility] ENDOSCOPY;  Service: Endoscopy;  Laterality: N/A;   CYSTOSCOPY WITH STENT PLACEMENT Bilateral 11/27/2018   Procedure: CYSTOSCOPY WITH STENT PLACEMENT;  Surgeon: Christina Coyer, MD;  Location: [facility] ORS;  Service: Urology;  Laterality: Bilateral;   CYSTOSCOPY/URETEROSCOPY/HOLMIUM LASER/STENT  PLACEMENT Bilateral 12/17/2018   Procedure: CYSTOSCOPY/URETEROSCOPY/HOLMIUM LASER/STENT Exchange;  Surgeon: Lawerence Pressman, MD;  Location: [facility];  Service: Urology;  Laterality: Bilateral;   ESOPHAGOGASTRODUODENOSCOPY  11/22/2014   Procedure: ESOPHAGOGASTRODUODENOSCOPY (EGD);  Surgeon: Cassie Click, MD;  Location: [facility] ENDOSCOPY;  Service: Endoscopy;;   ESOPHAGOGASTRODUODENOSCOPY (EGD) WITH PROPOFOL  N/A 03/05/2017   Procedure: ESOPHAGOGASTRODUODENOSCOPY (EGD) WITH PROPOFOL ;  Surgeon: Selena Daily, MD;  Location[facility] END[facility]COPY;  Service: Gastroenterology;  Laterality: N/A;   EXTRACORPOREAL SHOCK WAVE LITHOTRIPSY Right 03/04/2018   Procedure: EXTRACORPOREAL SHOCK WAVE LITHOTRIPSY (ESWL);  Surgeon: Lawerence Pressman, MD;  Location: [facility] ORS;  Service: Urology;  Laterality: Right;   EYE SURGERY Bilateral 2012   cataract extraction with iol   GALLBLADDER SURGERY  1968   hemi pelvectomy     left side at age 25   HEMIPELVIC Left 1962  LEG SURGERY Left    AMPUTATION d/t cancer at 89 years old   MASTECTOMY, PARTIAL Left 07/27/2017   Procedure: MASTECTOMY PARTIAL;  Surgeon: Marshall Skeeter, MD;  Location: [facility] ORS;  Service: General;  Laterality: Left;   MOUTH SURGERY  2019   teeth pulled with a bridge insertion.   fell out 12/16/18   OVARY SURGERY Right    cyst removed   SAVORY DILATION  11/22/2014   Procedure: SAVORY DILATION;  Surgeon: Cassie Click, MD;  Location: [facility] ENDOSCOPY;  Service: Endoscopy;;   SENTINEL NODE BIOPSY Left 07/27/2017   Procedure: SENTINEL NODE BIOPSY;  Surgeon: Marshall Skeeter, MD;  Location: [facility] ORS;  Service: General;  Laterality: Left;   SKIN CANCER EXCISION     nose and arm and face   TEE WITHOUT CARDIOVERSION N/A 07/25/2019   Procedure: TRANSESOPHAGEAL ECHOCARDIOGRAM (TEE);  Surgeon: Michelle Aid, MD;  Location: [facility] ORS;  Service: Cardiovascular;  Laterality: N/A;   TONSILLECTOMY      Allergies  Allergen Reactions   Ciprofloxacin  Hives    Codeine Other (See Comments)    HALLUCINATIONS   Tape     Rash and skin irritation/ paper tape and tegaderm OK   Ciprocinonide [Fluocinolone] Other (See Comments)    unknown   Amoxicillin Other (See Comments)    Upset stomach Has patient had a PCN reaction causing immediate rash, facial/tongue/throat swelling, SOB or lightheadedness with hypotension: No Has patient had a PCN reaction causing severe rash involving mucus membranes or skin necrosis: No Has patient had a PCN reaction that required hospitalization: No Has patient had a PCN reaction occurring within the last 10 years: Yes If all of the above answers are "NO", then may proceed with Cephalosporin use.;   Cefuroxime  Other (See Comments)    OK INTRACAMERALLY PER DR WLP, upset stomach   Latex Rash    Rast test NEGATIVE   Lipitor [Atorvastatin] Other (See Comments)    unknown    Outpatient Encounter Medications as of 08/19/2023  Medication Sig   acetaminophen  (TYLENOL ) 650 MG CR tablet Take 650 mg by mouth every 8 (eight) hours as needed for pain.   Cholecalciferol  (VITAMIN D -1000 MAX ST) 25 MCG (1000 UT) tablet Take 1,000 Units by mouth daily.   dextromethorphan-guaiFENesin  (ROBITUSSIN-DM) 10-100 MG/5ML liquid Take 10 mLs by mouth every 4 (four) hours as needed for cough.   diclofenac  Sodium (VOLTAREN ) 1 % GEL Apply 4 g topically. Apply to stump topically every 6 hours as needed. Apply to right knee topically three times daily for pain.   fluticasone  (FLONASE ) 50 MCG/ACT nasal spray Place 2 sprays into both nostrils 2 (two) times daily.   gabapentin  (NEURONTIN ) 100 MG capsule Take 100 mg by mouth 3 (three) times daily.   GLIPIZIDE  XL 2.5 MG 24 hr tablet Take 2.5 mg by mouth daily.    hydrocortisone 2.5 % cream Apply to Hemorrhoids/buttucks topically every 8 hours as needed for hemorrhoids.   ipratropium-albuterol  (DUONEB) 0.5-2.5 (3) MG/3ML SOLN Take 3 mLs by nebulization every 4 (four) hours as needed.   loratadine  (CLARITIN )  10 MG tablet Take 10 mg by mouth daily.   melatonin 3 MG TABS tablet Take 3 mg by mouth at bedtime.   metFORMIN  (GLUCOPHAGE ) 500 MG tablet Take 750 mg by mouth 2 (two) times daily with a meal.   methimazole  (TAPAZOLE ) 10 MG tablet Take 1 tablet by mouth daily.   metoprolol  tartrate (LOPRESSOR ) 25 MG tablet Take 25 mg by mouth 2 (two)  times daily.   ondansetron  (ZOFRAN ) 4 MG tablet Take 4 mg by mouth every 4 (four) hours as needed for nausea or vomiting.   OXYGEN 2lpm as needed for sats less than 88% every 1 hour as needed   pantoprazole  (PROTONIX ) 40 MG tablet Take 40 mg by mouth daily.   PARoxetine  (PAXIL ) 30 MG tablet Take 30 mg by mouth daily.   Polyethyl Glycol-Propyl Glycol 0.4-0.3 % SOLN Apply to eye 2 (two) times daily as needed.   polyethylene glycol (MIRALAX  / GLYCOLAX ) 17 g packet Take 17 g by mouth daily as needed.   PRADAXA  150 MG CAPS capsule TAKE 1 CAPSULE BY MOUTH TWICE DAILY START TREATMENT 10 HRS LAST DOSE OF LOVENOX    SUNSCREEN SPF50 EX Apply topically. Apply to exposed skin topically every 2 hours as needed for Sunburn Prevention   tiZANidine  (ZANAFLEX ) 2 MG tablet Take 2 mg by mouth every 12 (twelve) hours as needed for muscle spasms.   No facility-administered encounter medications on file as of 08/19/2023.    Review of Systems  Immunization History  Administered Date(s) Administered   Influenza, High Dose Seasonal PF 03/07/2023   Influenza-Unspecified 02/03/2020, 02/18/2021, 02/18/2022   Moderna Covid-19 Fall Seasonal Vaccine 4yrs & older 08/12/2022   PNEUMOCOCCAL CONJUGATE-20 01/17/2023   Pneumococcal Conjugate-13 09/28/2017   Tdap 12/31/2022   Unspecified SARS-COV-2 Vaccination 05/20/2019, 05/20/2019, 06/17/2019, 03/16/2020, 09/20/2020, 01/25/2021, 01/30/2023, 03/07/2023   Pertinent  Health Maintenance Due  Topic Date Due   HEMOGLOBIN A1C  09/17/2023   OPHTHALMOLOGY EXAM  10/30/2023   INFLUENZA VACCINE  12/04/2023   FOOT EXAM  12/11/2023   DEXA SCAN   Completed      12/18/2021    7:45 PM 12/19/2021    7:00 AM 12/19/2021   12:00 PM 01/24/2022   11:20 AM 12/11/2022    1:31 PM  Fall Risk  Falls in the past year?     0  (RETIRED) Patient Fall Risk Level High fall risk High fall risk High fall risk High fall risk    Functional Status Survey:    Vitals:   08/19/23 0848  BP: 138/64  Pulse: 68  SpO2: 91%  Weight: 160 lb 12.8 oz (72.9 kg)  Height: 5\' 3"  (1.6 m)   Body mass index is 28.48 kg/m. Physical Exam Constitutional:      Appearance: Normal appearance.  HENT:     Mouth/Throat:     Mouth: Mucous membranes are moist.     Pharynx: No oropharyngeal exudate or posterior oropharyngeal erythema.  Cardiovascular:     Rate and Rhythm: Normal rate and regular rhythm.     Pulses: Normal pulses.     Heart sounds: Normal heart sounds.  Pulmonary:     Effort: Pulmonary effort is normal.  Abdominal:     General: Abdomen is flat. Bowel sounds are normal.     Palpations: Abdomen is soft.  Musculoskeletal:        General: No swelling or tenderness.     Comments: Left leg amputation  Skin:    General: Skin is warm and dry.  Neurological:     Mental Status: She is alert and oriented to person, place, and time.     Gait: Gait normal.  Psychiatric:        Mood and Affect: Mood normal.     Labs reviewed: Recent Labs    03/28/23 0426 03/29/23 0414 03/30/23 0642 04/06/23 0000 07/06/23 0000 08/04/23 1011  NA 138 138 137 138 137 137  K 4.2  4.4 5.5* 5.4* 4.7 4.0  CL 101 103 103 101 103 102  CO2 29 27 25  29* 28* 25  GLUCOSE 177* 173* 202*  --   --  191*  BUN 11 10 8 16 14 14   CREATININE 0.49 0.50 0.62 0.6 0.5 0.71  CALCIUM 8.7* 9.1 9.3 10.0 10.9* 11.0*  MG 1.5* 1.9 1.7  --   --   --   PHOS 2.5 1.9* 2.4*  --   --   --    Recent Labs    03/21/23 0502 03/22/23 0500 03/23/23 0507  AST 18 15 22   ALT 10 12 14   ALKPHOS 45 46 53  BILITOT 0.4 <0.2 0.4  PROT 5.7* 5.9* 6.3*  ALBUMIN 2.8* 2.7* 2.8*   Recent Labs     03/19/23 1849 03/21/23 0502 03/28/23 0426 03/29/23 0414 04/06/23 0000 08/04/23 1011  WBC 13.1*   < > 11.0* 10.1 10.3 10.5  NEUTROABS 9.8*  --   --   --  7,025.00 7.2  HGB 12.6   < > 10.6* 10.9* 11.3* 12.7  HCT 40.5   < > 34.1* 35.1* 36 41.4  MCV 91.4   < > 90.9 92.9  --  85.4  PLT 244   < > 278 312 264 241   < > = values in this interval not displayed.   Lab Results  Component Value Date   TSH 3.77 04/16/2023   Lab Results  Component Value Date   HGBA1C 6.5 (H) 03/20/2023   Lab Results  Component Value Date   CHOL 110 12/15/2021   HDL 19 (L) 12/15/2021   LDLCALC 46 12/15/2021   TRIG 223 (H) 12/15/2021   CHOLHDL 5.8 12/15/2021    Significant Diagnostic Results in last 30 days:  No results found.  Assessment/Plan  Chronic Sore Throat Persistent sore throat without visible irritation or exudate, likely due to allergies during the current allergy season. Fluticasone  nasal spray has been initiated to alleviate symptoms. - Continue fluticasone  nasal spray for allergy symptom management.  Knee Pain Chronic knee pain potentially related to weight and mobility issues. Utilizes a lift for mobility. Pain management includes topical cream application. - Apply topical cream to the affected area twice daily for pain management.  Diabetes Mellitus Engaged in weight loss efforts to manage diabetes, with emphasis on maintaining muscle mass and adequate protein intake. Weight loss may enhance mobility and reduce reliance on assistive devices. - Encourage consumption of protein-rich foods to prevent muscle loss during weight loss.  Constipation Decreased bowel movement frequency, occurring every 1-3 days, with a preference for more regularity without diarrhea. - Consider initiating a regimen to increase bowel movement frequency if desired.  Hypokalemia Previously required potassium supplementation due to low levels. Current potassium levels are unavailable, but supplementation was  discontinued as it is no longer necessary. Dietary potassium intake was previously restricted. - Monitor potassium levels as needed.  S/p Amputation Patient is wheelchair dependent however working on strengthening her remaining leg in hopes of using sit to stand lift. Continue supportive care.   General Health Maintenance Undergoing regular health maintenance, including dermatology and ophthalmology evaluations. Dermatology follow-up is scheduled next week. - Continue regular dermatology and ophthalmology evaluations.  Family/ staff Communication: nursing  Labs/tests ordered:  labs in 2 weeks per endocrinology

## 2023-08-21 ENCOUNTER — Encounter: Payer: Self-pay | Admitting: Student

## 2023-08-25 ENCOUNTER — Encounter: Payer: Self-pay | Admitting: Nurse Practitioner

## 2023-08-25 ENCOUNTER — Non-Acute Institutional Stay (SKILLED_NURSING_FACILITY): Payer: Self-pay | Admitting: Nurse Practitioner

## 2023-08-25 DIAGNOSIS — I872 Venous insufficiency (chronic) (peripheral): Secondary | ICD-10-CM

## 2023-08-25 NOTE — Progress Notes (Unsigned)
 Location:  Other [place].  Nursing Home Room Number: [facility] [address] Place of Service:  SNF [phone]) Gilbert Lab, NP  PCP: Valrie Gehrig, MD  Patient Care Team: Valrie Gehrig, MD as PCP - General (Family Medicine) Marquita Situ Magali Schmitz, MD (General Surgery) Shellie Dials, MD as Consulting Physician (Oncology)  Extended Emergency Contact Information Primary Emergency Contact: Donne Gage Address: [address]          [place], [place] [address] [place] Home Phone: [phone] Mobile Phone: [phone] Relation: Daughter  Goals of care: Advanced Directive information    08/04/2023   10:24 AM  Advanced Directives  Does Patient Have a Medical Advance Directive? Yes  Type of Advance Directive Living will;Healthcare Power of Attorney  Does patient want to make changes to medical advance directive? Yes (ED - Information included in AVS)     Chief Complaint  Patient presents with   Foot Swelling    Right Foot Swelling.     HPI:  Pt is a 89 y.o. female seen today for an acute visit for Right Foot Swelling.  Staff have noticed that her right foot has been slightly swollen with redness to her toes.  She sits with leg dependant in the wheelchair most of the day.  She was wearing a shoe that strap went across the foot and noted the swelling was worse from midfoot to her toes so staff has been keeping the shoe off.  She denies any pain. No chest pain, shortness of breath.  No heat from foot No significant swelling to leg.  She currently wears compression hose     Past Medical History:  Diagnosis Date   Anxiety    Benign breast lumps    Blood clotting disorder (HCC)    Bone cancer (HCC)    head of femur, left leg ... 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  Oncotype recurrence score: 1.  DCIS, margin less than 0.5  mm.  Negative sentinel node.  Accelerated partial breast radiation.   Cervicalgia    Chronic kidney disease    kidney stones   Colon cancer [facility])    patient unaware of this   Complication of anesthesia    mood alteration / not sure if d/t pain medicine or anesthesia as she passed out    Cough    NASAL DRIP / SNEEZING / SORE THROAT MOSTLY CONSTANT   Depression    Diabetes (HCC)    Diverticulitis    Dizzy    GERD (gastroesophageal reflux disease)    H/O blood clots    arm and leg   HBP (high blood pressure)    Hematuria    gross   Hemorrhoids    HLD (hyperlipidemia)    Microscopic hematuria 06/02/2015   Muscle pain    Osteoarthritis    Osteoporosis    Panic disorder    Personal history of radiation therapy    PND (post-nasal drip)    CHRONIC WITH SORE THROAT AND SNEEZING   Reflux    Sepsis (HCC)    Sepsis due to pneumonia (HCC) 03/20/2023   Sepsis, unspecified organism (HCC) 12/15/2021   Formatting of this note might be different from the original.  Last Assessment & Plan: Formatting of this note might be different from the original. Appears to be multifactorial and secondary to healthcare associated pneumonia as  well as a UTI CT angio shows asymmetric airspace disease at the left base and posteriorly in the right upper lobe concerning for pneumonia Patient also has pyuria Patient   Severe sepsis (HCC) 12/15/2021   Skin cancer    nose   Swelling    Tremor    Tremors of nervous system    Ureteral stone with hydronephrosis 11/26/2018   Past Surgical History:  Procedure Laterality Date   APPENDECTOMY  1962   BREAST BIOPSY Right 2006?   benign   BREAST BIOPSY Left 2019   invasive mammary carcinoma   BREAST CYST EXCISION Left 07/27/2017   Procedure: SKIN CYST EXCISED;  Surgeon: Marshall Skeeter, MD;  Location: [facility] ORS;  Service: General;  Laterality: Left;   BREAST LUMPECTOMY Left 07/2017   invasive mammary, DCIS  mammosite   BREAST SURGERY Right 2019   CARPAL TUNNEL  RELEASE Right    CATARACT EXTRACTION W/PHACO Left 11/11/2016   Procedure: CATARACT EXTRACTION PHACO AND INTRAOCULAR LENS PLACEMENT (IOC);  Surgeon: Clair Crews, MD;  Location: [facility] ORS;  Service: Ophthalmology;  Laterality: Left;  US  01:07.9 AP% 19.2 CDE 13.02 Fluid pack lot # [id number]   CATARACT EXTRACTION W/PHACO Right 12/09/2016   Procedure: CATARACT EXTRACTION PHACO AND INTRAOCULAR LENS PLACEMENT (IOC);  Surgeon: Clair Crews, MD;  Location: [facility] ORS;  Service: Ophthalmology;  Laterality: Right;  US  00:49 AP% 21.2 CDE 10.39 Fluid pack lot # [id number]   CHOLECYSTECTOMY     COLONOSCOPY WITH PROPOFOL  N/A 11/22/2014   Procedure: COLONOSCOPY WITH PROPOFOL ;  Surgeon: Cassie Click, MD;  Location: [facility] ENDOSCOPY;  Service: Endoscopy;  Laterality: N/A;   CYSTOSCOPY WITH STENT PLACEMENT Bilateral 11/27/2018   Procedure: CYSTOSCOPY WITH STENT PLACEMENT;  Surgeon: Christina Coyer, MD;  Location: [facility] ORS;  Service: Urology;  Laterality: Bilateral;   CYSTOSCOPY/URETEROSCOPY/HOLMIUM LASER/STENT PLACEMENT Bilateral 12/17/2018   Procedure: CYSTOSCOPY/URETEROSCOPY/HOLMIUM LASER/STENT Exchange;  Surgeon: Lawerence Pressman, MD;  Location: [facility] ORS;  Service: Urology;  Laterality: Bilateral;   ESOPHAGOGASTRODUODENOSCOPY  11/22/2014   Procedure: ESOPHAGOGASTRODUODENOSCOPY (EGD);  Surgeon: Cassie Click, MD;  Location: [facility] ENDOSCOPY;  Service: Endoscopy;;   ESOPHAGOGASTRODUODENOSCOPY (EGD) WITH PROPOFOL  N/A 03/05/2017   Procedure: ESOPHAGOGASTRODUODENOSCOPY (EGD) WITH PROPOFOL ;  Surgeon: Selena Daily, MD;  Location: [facility] ENDOSCOPY;  Service: Gastroenterology;  Laterality: N/A;   EXTRACORPOREAL SHOCK WAVE LITHOTRIPSY Right 03/04/2018   Procedure: EXTRACORPOREAL SHOCK WAVE LITHOTRIPSY (ESWL);  Surgeon: Lawerence Pressman, MD;  Location: [facility] ORS;  Service: Urology;  Laterality: Right;   EYE SURGERY Bilateral 2012   cataract extraction with iol   GALLBLADDER SURGERY  1968   hemi pelvectomy      left side at age 58   HEMIPELVIC Left 1962   LEG SURGERY Left    AMPUTATION d/t cancer at 89 years old   MASTECTOMY, PARTIAL Left 07/27/2017   Procedure: MASTECTOMY PARTIAL;  Surgeon: Marshall Skeeter, MD;  Location: [facility] ORS;  Service: General;  Laterality: Left;   MOUTH SURGERY  2019   teeth pulled with a bridge insertion.   fell out 12/16/18   OVARY SURGERY Right    cyst removed   SAVORY DILATION  11/22/2014   Procedure: SAVORY DILATION;  Surgeon: Cassie Click, MD;  Location: [facility] ENDOSCOPY;  Service: Endoscopy;;   SENTINEL NODE BIOPSY Left 07/27/2017   Procedure: SENTINEL NODE BIOPSY;  Surgeon: Marshall Skeeter, MD;  Location: [facility] ORS;  Service: General;  Laterality: Left;   SKIN CANCER EXCISION     nose and arm and face  TEE WITHOUT CARDIOVERSION N/A 07/25/2019   Procedure: TRANSESOPHAGEAL ECHOCARDIOGRAM (TEE);  Surgeon: Michelle Aid, MD;  Location: [facility] ORS;  Service: Cardiovascular;  Laterality: N/A;   TONSILLECTOMY      Allergies  Allergen Reactions   Ciprofloxacin  Hives   Codeine Other (See Comments)    HALLUCINATIONS   Tape     Rash and skin irritation/ paper tape and tegaderm OK   Ciprocinonide [Fluocinolone] Other (See Comments)    unknown   Amoxicillin Other (See Comments)    Upset stomach Has patient had a PCN reaction causing immediate rash, facial/tongue/throat swelling, SOB or lightheadedness with hypotension: No Has patient had a PCN reaction causing severe rash involving mucus membranes or skin necrosis: No Has patient had a PCN reaction that required hospitalization: No Has patient had a PCN reaction occurring within the last 10 years: Yes If all of the above answers are "NO", then Doren Kaspar proceed with Cephalosporin use.;   Cefuroxime  Other (See Comments)    OK INTRACAMERALLY PER DR WLP, upset stomach   Latex Rash    Rast test NEGATIVE   Lipitor [Atorvastatin] Other (See Comments)    unknown    Outpatient Encounter Medications as of 08/25/2023   Medication Sig   acetaminophen  (TYLENOL ) 650 MG CR tablet Take 650 mg by mouth every 8 (eight) hours as needed for pain.   Cholecalciferol  (VITAMIN D -1000 MAX ST) 25 MCG (1000 UT) tablet Take 1,000 Units by mouth daily.   dextromethorphan-guaiFENesin  (ROBITUSSIN-DM) 10-100 MG/5ML liquid Take 10 mLs by mouth every 4 (four) hours as needed for cough.   diclofenac  Sodium (VOLTAREN ) 1 % GEL Apply 4 g topically. Apply to stump topically every 6 hours as needed. Apply to right knee topically three times daily for pain.   fluticasone  (FLONASE ) 50 MCG/ACT nasal spray Place 2 sprays into both nostrils 2 (two) times daily.   gabapentin  (NEURONTIN ) 100 MG capsule Take 100 mg by mouth 3 (three) times daily.   GLIPIZIDE  XL 2.5 MG 24 hr tablet Take 2.5 mg by mouth daily.    hydrocortisone 2.5 % cream Apply to Hemorrhoids/buttucks topically every 8 hours as needed for hemorrhoids.   ipratropium-albuterol  (DUONEB) 0.5-2.5 (3) MG/3ML SOLN Take 3 mLs by nebulization every 4 (four) hours as needed.   loratadine  (CLARITIN ) 10 MG tablet Take 10 mg by mouth daily.   melatonin 3 MG TABS tablet Take 3 mg by mouth at bedtime.   metFORMIN  (GLUCOPHAGE ) 500 MG tablet Take 750 mg by mouth 2 (two) times daily with a meal.   metoprolol  tartrate (LOPRESSOR ) 25 MG tablet Take 25 mg by mouth 2 (two) times daily.   ondansetron  (ZOFRAN ) 4 MG tablet Take 4 mg by mouth every 4 (four) hours as needed for nausea or vomiting.   OXYGEN 2lpm as needed for sats less than 88% every 1 hour as needed   pantoprazole  (PROTONIX ) 40 MG tablet Take 40 mg by mouth daily.   PARoxetine  (PAXIL ) 30 MG tablet Take 30 mg by mouth daily.   Polyethyl Glycol-Propyl Glycol 0.4-0.3 % SOLN Apply to eye 2 (two) times daily as needed.   polyethylene glycol (MIRALAX  / GLYCOLAX ) 17 g packet Take 17 g by mouth daily as needed.   PRADAXA  150 MG CAPS capsule TAKE 1 CAPSULE BY MOUTH TWICE DAILY START TREATMENT 10 HRS LAST DOSE OF LOVENOX    SUNSCREEN SPF50 EX Apply  topically. Apply to exposed skin topically every 2 hours as needed for Sunburn Prevention   tiZANidine  (ZANAFLEX ) 2 MG tablet Take  2 mg by mouth every 12 (twelve) hours as needed for muscle spasms.   methimazole  (TAPAZOLE ) 10 MG tablet Take 1 tablet by mouth daily. (Patient not taking: Reported on 08/25/2023)   No facility-administered encounter medications on file as of 08/25/2023.    Review of Systems  Constitutional:  Negative for activity change and appetite change.  Respiratory:  Negative for shortness of breath.   Cardiovascular:  Negative for leg swelling.  Skin:  Positive for color change (to right foot and swelling to foot).    Immunization History  Administered Date(s) Administered   Influenza, High Dose Seasonal PF 03/07/2023   Influenza-Unspecified 02/03/2020, 02/18/2021, 02/18/2022   Moderna Covid-19 Fall Seasonal Vaccine 74yrs & older 08/12/2022   PNEUMOCOCCAL CONJUGATE-20 01/17/2023   Pneumococcal Conjugate-13 09/28/2017   Tdap 12/31/2022   Unspecified SARS-COV-2 Vaccination 05/20/2019, 05/20/2019, 06/17/2019, 03/16/2020, 09/20/2020, 01/25/2021, 01/30/2023, 03/07/2023   Pertinent  Health Maintenance Due  Topic Date Due   HEMOGLOBIN A1C  09/17/2023   OPHTHALMOLOGY EXAM  10/30/2023   INFLUENZA VACCINE  12/04/2023   FOOT EXAM  12/11/2023   DEXA SCAN  Completed      12/18/2021    7:45 PM 12/19/2021    7:00 AM 12/19/2021   12:00 PM 01/24/2022   11:20 AM 12/11/2022    1:31 PM  Fall Risk  Falls in the past year?     0  (RETIRED) Patient Fall Risk Level High fall risk High fall risk High fall risk High fall risk    Functional Status Survey:    Vitals:   08/25/23 1148  BP: 136/78  Pulse: 70  Resp: 20  Temp: 97.8 F (36.6 C)  SpO2: 90%  Weight: 160 lb 12.8 oz (72.9 kg)  Height: 5\' 3"  (1.6 m)   Body mass index is 28.48 kg/m. Physical Exam Constitutional:      General: She is not in acute distress.    Appearance: She is well-developed. She is not diaphoretic.   HENT:     Head: Normocephalic and atraumatic.     Mouth/Throat:     Pharynx: No oropharyngeal exudate.  Eyes:     Conjunctiva/sclera: Conjunctivae normal.     Pupils: Pupils are equal, round, and reactive to light.  Cardiovascular:     Rate and Rhythm: Normal rate and regular rhythm.     Pulses:          Dorsalis pedis pulses are 2+ on the right side.     Heart sounds: Normal heart sounds.  Pulmonary:     Effort: Pulmonary effort is normal.     Breath sounds: Normal breath sounds.  Abdominal:     General: Bowel sounds are normal.     Palpations: Abdomen is soft.  Musculoskeletal:        General: Swelling (mild- noted to right foot) present.     Cervical back: Normal range of motion and neck supple.     Right lower leg: No edema.  Feet:     Right foot:     Skin integrity: No erythema or warmth.     Comments: Mild swelling noted to right foot Skin:    General: Skin is warm and dry.  Neurological:     Mental Status: She is alert.  Psychiatric:        Mood and Affect: Mood normal.     Labs reviewed: Recent Labs    03/28/23 0426 03/29/23 0414 03/30/23 0642 04/06/23 0000 07/06/23 0000 08/04/23 1011  NA 138 138 137 138 137  137  K 4.2 4.4 5.5* 5.4* 4.7 4.0  CL 101 103 103 101 103 102  CO2 29 27 25  29* 28* 25  GLUCOSE 177* 173* 202*  --   --  191*  BUN 11 10 8 16 14 14   CREATININE 0.49 0.50 0.62 0.6 0.5 0.71  CALCIUM 8.7* 9.1 9.3 10.0 10.9* 11.0*  MG 1.5* 1.9 1.7  --   --   --   PHOS 2.5 1.9* 2.4*  --   --   --    Recent Labs    03/21/23 0502 03/22/23 0500 03/23/23 0507  AST 18 15 22   ALT 10 12 14   ALKPHOS 45 46 53  BILITOT 0.4 <0.2 0.4  PROT 5.7* 5.9* 6.3*  ALBUMIN 2.8* 2.7* 2.8*   Recent Labs    03/19/23 1849 03/21/23 0502 03/28/23 0426 03/29/23 0414 04/06/23 0000 08/04/23 1011  WBC 13.1*   < > 11.0* 10.1 10.3 10.5  NEUTROABS 9.8*  --   --   --  7,025.00 7.2  HGB 12.6   < > 10.6* 10.9* 11.3* 12.7  HCT 40.5   < > 34.1* 35.1* 36 41.4  MCV 91.4    < > 90.9 92.9  --  85.4  PLT 244   < > 278 312 264 241   < > = values in this interval not displayed.   Lab Results  Component Value Date   TSH 3.77 04/16/2023   Lab Results  Component Value Date   HGBA1C 6.5 (H) 03/20/2023   Lab Results  Component Value Date   CHOL 110 12/15/2021   HDL 19 (L) 12/15/2021   LDLCALC 46 12/15/2021   TRIG 223 (H) 12/15/2021   CHOLHDL 5.8 12/15/2021    Significant Diagnostic Results in last 30 days:  No results found.  Assessment/Plan 1. Venous insufficiency of right leg (Primary) Color changes and swelling noted to right foot consistent with venous insuffiencey. 2+ pedal pulses without pain or erythema Continue compression hose and recommended elevation of leg as tolerates to help with edema.    Jessica K. Denney Fisherman [facility] [phone]

## 2023-09-03 ENCOUNTER — Inpatient Hospital Stay: Attending: Oncology

## 2023-09-03 DIAGNOSIS — M810 Age-related osteoporosis without current pathological fracture: Secondary | ICD-10-CM

## 2023-09-03 DIAGNOSIS — C50412 Malignant neoplasm of upper-outer quadrant of left female breast: Secondary | ICD-10-CM | POA: Diagnosis not present

## 2023-09-03 LAB — BASIC METABOLIC PANEL - CANCER CENTER ONLY
Anion gap: 10 (ref 5–15)
BUN: 16 mg/dL (ref 8–23)
CO2: 24 mmol/L (ref 22–32)
Calcium: 10.2 mg/dL (ref 8.9–10.3)
Chloride: 104 mmol/L (ref 98–111)
Creatinine: 0.62 mg/dL (ref 0.44–1.00)
GFR, Estimated: >60 mL/min (ref >60)
Glucose, Bld: 175 mg/dL — ABNORMAL HIGH (ref 70–99)
Potassium: 4.1 mmol/L (ref 3.5–5.1)
Sodium: 138 mmol/L (ref 135–145)

## 2023-09-04 LAB — CANCER ANTIGEN 27.29: CA 27.29: 9.7 U/mL (ref 0.0–38.6)

## 2023-09-07 DIAGNOSIS — E1151 Type 2 diabetes mellitus with diabetic peripheral angiopathy without gangrene: Secondary | ICD-10-CM | POA: Diagnosis not present

## 2023-09-08 LAB — HEMOGLOBIN A1C: Hemoglobin A1C: 7.6

## 2023-10-15 DIAGNOSIS — M6281 Muscle weakness (generalized): Secondary | ICD-10-CM | POA: Diagnosis not present

## 2023-10-20 ENCOUNTER — Telehealth: Payer: Self-pay | Admitting: Pharmacist

## 2023-10-20 DIAGNOSIS — M810 Age-related osteoporosis without current pathological fracture: Secondary | ICD-10-CM | POA: Diagnosis not present

## 2023-10-20 DIAGNOSIS — E0590 Thyrotoxicosis, unspecified without thyrotoxic crisis or storm: Secondary | ICD-10-CM | POA: Diagnosis not present

## 2023-10-20 DIAGNOSIS — E1169 Type 2 diabetes mellitus with other specified complication: Secondary | ICD-10-CM

## 2023-10-20 DIAGNOSIS — E210 Primary hyperparathyroidism: Secondary | ICD-10-CM | POA: Diagnosis not present

## 2023-10-20 DIAGNOSIS — E0510 Thyrotoxicosis with toxic single thyroid nodule without thyrotoxic crisis or storm: Secondary | ICD-10-CM | POA: Diagnosis not present

## 2023-10-20 NOTE — Progress Notes (Signed)
   10/20/2023  Patient ID: Julie Acosta, female   DOB: 10-27-1933, 89 y.o.   MRN: [id number] Pharmacy Quality Measure Review  This patient is appearing on a report for being at risk of failing the adherence measure for diabetes medications this calendar year.   Medication: Glipizide  2.5 mg and Metformin  500 mg Last fill date: 04/24 for 30 day supply  Reviewed Dr. Anson Basta. Patient gets meds filled by [facility] . They have been dispensing Patients' medications in 7day cycle packs and then doing a monthly composite billing.  Both medications have been filled monthly by [facility]. Will assume compliance until the next report.  Patient's A1c is 6.5%  Geronimo Krabbe, PharmD, [facility] Clinical Pharmacist [phone]

## 2023-10-21 DIAGNOSIS — M6281 Muscle weakness (generalized): Secondary | ICD-10-CM | POA: Diagnosis not present

## 2023-10-27 ENCOUNTER — Encounter: Payer: Self-pay | Admitting: Nurse Practitioner

## 2023-10-27 ENCOUNTER — Non-Acute Institutional Stay (SKILLED_NURSING_FACILITY): Payer: Self-pay | Admitting: Nurse Practitioner

## 2023-10-27 DIAGNOSIS — F3341 Major depressive disorder, recurrent, in partial remission: Secondary | ICD-10-CM

## 2023-10-27 DIAGNOSIS — E0590 Thyrotoxicosis, unspecified without thyrotoxic crisis or storm: Secondary | ICD-10-CM

## 2023-10-27 DIAGNOSIS — I872 Venous insufficiency (chronic) (peripheral): Secondary | ICD-10-CM | POA: Diagnosis not present

## 2023-10-27 DIAGNOSIS — M1711 Unilateral primary osteoarthritis, right knee: Secondary | ICD-10-CM | POA: Diagnosis not present

## 2023-10-27 DIAGNOSIS — I10 Essential (primary) hypertension: Secondary | ICD-10-CM | POA: Diagnosis not present

## 2023-10-27 DIAGNOSIS — E1142 Type 2 diabetes mellitus with diabetic polyneuropathy: Secondary | ICD-10-CM | POA: Diagnosis not present

## 2023-10-27 DIAGNOSIS — I480 Paroxysmal atrial fibrillation: Secondary | ICD-10-CM

## 2023-10-27 DIAGNOSIS — K5904 Chronic idiopathic constipation: Secondary | ICD-10-CM | POA: Diagnosis not present

## 2023-10-27 NOTE — Progress Notes (Signed)
 Location:  Other Nursing Home Room Number: [facility] [address] Place of Service:  SNF [phone])  Abdul, [place], MD  Patient Care Team: Abdul Fine, MD as PCP - General (Family Medicine) Dessa, Reyes ORN, MD (General Surgery) Jacobo Evalene PARAS, MD as Consulting Physician (Oncology)  Extended Emergency Contact Information Primary Emergency Contact: Kelsie Maryruth FALCON Address: [address]          [place], [place] [address] [place] Home Phone: [phone] Mobile Phone: [phone] Relation: Daughter  Goals of care: Advanced Directive information    10/27/2023   12:23 PM  Advanced Directives  Does Patient Have a Medical Advance Directive? Yes  Type of Estate agent of [place];Out of facility DNR (pink MOST or yellow form)  Does patient want to make changes to medical advance directive? No - Patient declined  Copy of Healthcare Power of Attorney in Chart? Yes - validated most recent copy scanned in chart (See row information)     Chief Complaint  Patient presents with   Medical Management of Chronic Issues    Routine Visit    HPI:  Pt is a 89 y.o. female seen today for medical management of chronic disease. Pt with hx of htn, CKD, DM, OA, depression, hyperthyroid, neuropathy  She reports ongoing pain in right knee. Uses voltaren  gel and tylenol .  Continues on glipizide  and metformin  for diabetes, blood sugars 170-200 fasting GERD- controlled on protonix   Following with endocrine was over treated on tapazole  so stopped, following TSH Nursing has no concerns She has no complaints at this time.   Past Medical History:  Diagnosis Date   Anxiety    Benign breast lumps    Blood clotting disorder (HCC)    Bone cancer (HCC)    head of femur, left leg ... 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  Oncotype  recurrence score: 1.  DCIS, margin less than 0.5 mm.  Negative sentinel node.  Accelerated partial breast radiation.   Cervicalgia    Chronic kidney disease    kidney stones   Colon cancer [facility])    patient unaware of this   Complication of anesthesia    mood alteration / not sure if d/t pain medicine or anesthesia as she passed out    Cough    NASAL DRIP / SNEEZING / SORE THROAT MOSTLY CONSTANT   Depression    Diabetes (HCC)    Diverticulitis    Dizzy    GERD (gastroesophageal reflux disease)    H/O blood clots    arm and leg   HBP (high blood pressure)    Hematuria    gross   Hemorrhoids    HLD (hyperlipidemia)    Microscopic hematuria 06/02/2015   Muscle pain    Osteoarthritis    Osteoporosis    Panic disorder    Personal history of radiation therapy    PND (post-nasal drip)    CHRONIC WITH SORE THROAT AND SNEEZING   Reflux    Sepsis (HCC)    Sepsis due to pneumonia (HCC) 03/20/2023   Sepsis, unspecified organism (HCC) 12/15/2021   Formatting of this note might be different from the original.  Last Assessment & Plan: Formatting of this note might be different from the original. Appears to be multifactorial and secondary to healthcare associated pneumonia as well as a UTI CT angio shows asymmetric airspace  disease at the left base and posteriorly in the right upper lobe concerning for pneumonia Patient also has pyuria Patient   Severe sepsis (HCC) 12/15/2021   Skin cancer    nose   Swelling    Tremor    Tremors of nervous system    Ureteral stone with hydronephrosis 11/26/2018   Past Surgical History:  Procedure Laterality Date   APPENDECTOMY  1962   BREAST BIOPSY Right 2006?   benign   BREAST BIOPSY Left 2019   invasive mammary carcinoma   BREAST CYST EXCISION Left 07/27/2017   Procedure: SKIN CYST EXCISED;  Surgeon: Dessa Reyes ORN, MD;  Location: [facility] ORS;  Service: General;  Laterality: Left;   BREAST LUMPECTOMY Left 07/2017   invasive mammary, DCIS   mammosite   BREAST SURGERY Right 2019   CARPAL TUNNEL RELEASE Right    CATARACT EXTRACTION W/PHACO Left 11/11/2016   Procedure: CATARACT EXTRACTION PHACO AND INTRAOCULAR LENS PLACEMENT (IOC);  Surgeon: Jaye Fallow, MD;  Location: [facility] ORS;  Service: Ophthalmology;  Laterality: Left;  US  01:07.9 AP% 19.2 CDE 13.02 Fluid pack lot # [id number]   CATARACT EXTRACTION W/PHACO Right 12/09/2016   Procedure: CATARACT EXTRACTION PHACO AND INTRAOCULAR LENS PLACEMENT (IOC);  Surgeon: Jaye Fallow, MD;  Location: [facility] ORS;  Service: Ophthalmology;  Laterality: Right;  US  00:49 AP% 21.2 CDE 10.39 Fluid pack lot # [id number]   CHOLECYSTECTOMY     COLONOSCOPY WITH PROPOFOL  N/A 11/22/2014   Procedure: COLONOSCOPY WITH PROPOFOL ;  Surgeon: Lamar ONEIDA Holmes, MD;  Location: [facility] ENDOSCOPY;  Service: Endoscopy;  Laterality: N/A;   CYSTOSCOPY WITH STENT PLACEMENT Bilateral 11/27/2018   Procedure: CYSTOSCOPY WITH STENT PLACEMENT;  Surgeon: Nieves Cough, MD;  Location: [facility] ORS;  Service: Urology;  Laterality: Bilateral;   CYSTOSCOPY/URETEROSCOPY/HOLMIUM LASER/STENT PLACEMENT Bilateral 12/17/2018   Procedure: CYSTOSCOPY/URETEROSCOPY/HOLMIUM LASER/STENT Exchange;  Surgeon: Francisca Redell BROCKS, MD;  Location: [facility] ORS;  Service: Urology;  Laterality: Bilateral;   ESOPHAGOGASTRODUODENOSCOPY  11/22/2014   Procedure: ESOPHAGOGASTRODUODENOSCOPY (EGD);  Surgeon: Lamar ONEIDA Holmes, MD;  Location: [facility] ENDOSCOPY;  Service: Endoscopy;;   ESOPHAGOGASTRODUODENOSCOPY (EGD) WITH PROPOFOL  N/A 03/05/2017   Procedure: ESOPHAGOGASTRODUODENOSCOPY (EGD) WITH PROPOFOL ;  Surgeon: Unk Corinn Skiff, MD;  Location: [facility] ENDOSCOPY;  Service: Gastroenterology;  Laterality: N/A;   EXTRACORPOREAL SHOCK WAVE LITHOTRIPSY Right 03/04/2018   Procedure: EXTRACORPOREAL SHOCK WAVE LITHOTRIPSY (ESWL);  Surgeon: Francisca Redell BROCKS, MD;  Location: [facility] ORS;  Service: Urology;  Laterality: Right;   EYE SURGERY Bilateral 2012   cataract extraction with  iol   GALLBLADDER SURGERY  1968   hemi pelvectomy     left side at age 41   HEMIPELVIC Left 1962   LEG SURGERY Left    AMPUTATION d/t cancer at 89 years old   MASTECTOMY, PARTIAL Left 07/27/2017   Procedure: MASTECTOMY PARTIAL;  Surgeon: Dessa Reyes ORN, MD;  Location: [facility] ORS;  Service: General;  Laterality: Left;   MOUTH SURGERY  2019   teeth pulled with a bridge insertion.   fell out 12/16/18   OVARY SURGERY Right    cyst removed   SAVORY DILATION  11/22/2014   Procedure: SAVORY DILATION;  Surgeon: Lamar ONEIDA Holmes, MD;  Location[facility] ENDOSCOPY;  Service: Endoscopy;;   SENTINEL NODE BIOPSY Left 07/27/2017   Procedure: SENTINEL NODE BIOPSY;  Surgeon: Dessa Reyes ORN, MD;  Location: [facility] ORS;  Service: General;  Laterality: Left;   SKIN CANCER EXCISION     nose and arm and face   TEE WITHOUT CARDIOVERSION N/A 07/25/2019   Procedure:  TRANSESOPHAGEAL ECHOCARDIOGRAM (TEE);  Surgeon: Hester Wolm PARAS, MD;  Location: [facility] ORS;  Service: Cardiovascular;  Laterality: N/A;   TONSILLECTOMY      Allergies  Allergen Reactions   Ciprofloxacin  Hives   Codeine Other (See Comments)    HALLUCINATIONS   Tape     Rash and skin irritation/ paper tape and tegaderm OK   Ciprocinonide [Fluocinolone] Other (See Comments)    unknown   Amoxicillin Other (See Comments)    Upset stomach Has patient had a PCN reaction causing immediate rash, facial/tongue/throat swelling, SOB or lightheadedness with hypotension: No Has patient had a PCN reaction causing severe rash involving mucus membranes or skin necrosis: No Has patient had a PCN reaction that required hospitalization: No Has patient had a PCN reaction occurring within the last 10 years: Yes If all of the above answers are NO, then may proceed with Cephalosporin use.;   Cefuroxime  Other (See Comments)    OK INTRACAMERALLY PER DR WLP, upset stomach   Latex Rash    Rast test NEGATIVE   Lipitor [Atorvastatin] Other (See Comments)    unknown     Outpatient Encounter Medications as of 10/27/2023  Medication Sig   acetaminophen  (TYLENOL ) 650 MG CR tablet Take 650 mg by mouth every 8 (eight) hours as needed for pain.   Cholecalciferol  (VITAMIN D -1000 MAX ST) 25 MCG (1000 UT) tablet Take 1,000 Units by mouth daily.   dextromethorphan-guaiFENesin  (ROBITUSSIN-DM) 10-100 MG/5ML liquid Take 10 mLs by mouth every 4 (four) hours as needed for cough.   diclofenac  Sodium (VOLTAREN ) 1 % GEL Apply 4 g topically. Apply to stump topically every 6 hours as needed. Apply to right knee topically three times daily for pain.   fluticasone  (FLONASE ) 50 MCG/ACT nasal spray Place 2 sprays into both nostrils 2 (two) times daily.   gabapentin  (NEURONTIN ) 100 MG capsule Take 100 mg by mouth 3 (three) times daily.   GLIPIZIDE  XL 2.5 MG 24 hr tablet Take 2.5 mg by mouth daily.    hydrocortisone 2.5 % cream Apply to Hemorrhoids/buttucks topically every 8 hours as needed for hemorrhoids.   ipratropium-albuterol  (DUONEB) 0.5-2.5 (3) MG/3ML SOLN Take 3 mLs by nebulization every 4 (four) hours as needed.   loratadine  (CLARITIN ) 10 MG tablet Take 10 mg by mouth daily.   melatonin 3 MG TABS tablet Take 3 mg by mouth at bedtime.   metFORMIN  (GLUCOPHAGE ) 500 MG tablet Take 750 mg by mouth 2 (two) times daily with a meal.   metoprolol  tartrate (LOPRESSOR ) 25 MG tablet Take 25 mg by mouth 2 (two) times daily.   ondansetron  (ZOFRAN ) 4 MG tablet Take 4 mg by mouth every 4 (four) hours as needed for nausea or vomiting.   OXYGEN 2lpm as needed for sats less than 88% every 1 hour as needed   pantoprazole  (PROTONIX ) 40 MG tablet Take 40 mg by mouth daily.   PARoxetine  (PAXIL ) 30 MG tablet Take 30 mg by mouth daily.   Polyethyl Glycol-Propyl Glycol 0.4-0.3 % SOLN Apply to eye 2 (two) times daily as needed.   polyethylene glycol (MIRALAX  / GLYCOLAX ) 17 g packet Take 17 g by mouth daily as needed.   PRADAXA  150 MG CAPS capsule TAKE 1 CAPSULE BY MOUTH TWICE DAILY START TREATMENT  10 HRS LAST DOSE OF LOVENOX    SUNSCREEN SPF50 EX Apply topically. Apply to exposed skin topically every 2 hours as needed for Sunburn Prevention   tiZANidine  (ZANAFLEX ) 2 MG tablet Take 2 mg by mouth every 12 (twelve) hours  as needed for muscle spasms.   methimazole  (TAPAZOLE ) 10 MG tablet Take 1 tablet by mouth daily. (Patient not taking: Reported on 10/27/2023)   No facility-administered encounter medications on file as of 10/27/2023.    Review of Systems  Constitutional:  Negative for activity change, appetite change, fatigue and unexpected weight change.  HENT:  Negative for congestion and hearing loss.   Eyes: Negative.   Respiratory:  Negative for cough and shortness of breath.   Cardiovascular:  Negative for chest pain, palpitations and leg swelling.  Gastrointestinal:  Negative for abdominal pain, constipation and diarrhea.  Genitourinary:  Negative for difficulty urinating and dysuria.  Musculoskeletal:  Positive for arthralgias. Negative for myalgias.  Skin:  Negative for color change and wound.  Neurological:  Negative for dizziness and weakness.  Psychiatric/Behavioral:  Negative for agitation, behavioral problems and confusion.      Immunization History  Administered Date(s) Administered   Influenza, High Dose Seasonal PF 03/07/2023   Influenza-Unspecified 02/03/2020, 02/18/2021, 02/18/2022   Moderna Covid-19 Fall Seasonal Vaccine 29yrs & older 08/12/2022   PNEUMOCOCCAL CONJUGATE-20 01/17/2023   Pneumococcal Conjugate-13 09/28/2017   Tdap 12/31/2022   Unspecified SARS-COV-2 Vaccination 05/20/2019, 05/20/2019, 06/17/2019, 03/16/2020, 09/20/2020, 01/25/2021, 01/30/2023, 03/07/2023   Pertinent  Health Maintenance Due  Topic Date Due   OPHTHALMOLOGY EXAM  10/30/2023   INFLUENZA VACCINE  12/04/2023   FOOT EXAM  12/11/2023   HEMOGLOBIN A1C  03/10/2024   DEXA SCAN  Completed      12/18/2021    7:45 PM 12/19/2021    7:00 AM 12/19/2021   12:00 PM 01/24/2022   11:20 AM  12/11/2022    1:31 PM  Fall Risk  Falls in the past year?     0  (RETIRED) Patient Fall Risk Level High fall risk  High fall risk  High fall risk  High fall risk       Data saved with a previous flowsheet row definition   Functional Status Survey:    Vitals:   10/27/23 1220  BP: 116/63  Pulse: 80  Resp: 18  Temp: 97.8 F (36.6 C)  SpO2: 90%  Weight: 156 lb 3.2 oz (70.9 kg)  Height: 5' 3 (1.6 m)   Body mass index is 27.67 kg/m. Physical Exam Constitutional:      General: She is not in acute distress.    Appearance: She is well-developed. She is not diaphoretic.  HENT:     Head: Normocephalic and atraumatic.     Mouth/Throat:     Pharynx: No oropharyngeal exudate.  Eyes:     Conjunctiva/sclera: Conjunctivae normal.     Pupils: Pupils are equal, round, and reactive to light.  Cardiovascular:     Rate and Rhythm: Normal rate and regular rhythm.     Heart sounds: Normal heart sounds.  Pulmonary:     Effort: Pulmonary effort is normal.     Breath sounds: Normal breath sounds.  Abdominal:     General: Bowel sounds are normal.     Palpations: Abdomen is soft.  Musculoskeletal:     Cervical back: Normal range of motion and neck supple.     Right lower leg: No edema.     Left lower leg: No edema.  Skin:    General: Skin is warm and dry.  Neurological:     Mental Status: She is alert.  Psychiatric:        Mood and Affect: Mood normal.     Labs reviewed: Recent Labs    03/28/23  [id number] 03/29/23 0414 03/30/23 0642 04/06/23 0000 07/06/23 0000 08/04/23 1011 09/03/23 1116  NA 138 138 137   < > 137 137 138  K 4.2 4.4 5.5*   < > 4.7 4.0 4.1  CL 101 103 103   < > 103 102 104  CO2 29 27 25    < > 28* 25 24  GLUCOSE 177* 173* 202*  --   --  191* 175*  BUN 11 10 8    < > 14 14 16   CREATININE 0.49 0.50 0.62   < > 0.5 0.71 0.62  CALCIUM 8.7* 9.1 9.3   < > 10.9* 11.0* 10.2  MG 1.5* 1.9 1.7  --   --   --   --   PHOS 2.5 1.9* 2.4*  --   --   --   --    < > = values in this  interval not displayed.   Recent Labs    03/21/23 0502 03/22/23 0500 03/23/23 0507  AST 18 15 22   ALT 10 12 14   ALKPHOS 45 46 53  BILITOT 0.4 <0.2 0.4  PROT 5.7* 5.9* 6.3*  ALBUMIN 2.8* 2.7* 2.8*   Recent Labs    03/19/23 1849 03/21/23 0502 03/28/23 0426 03/29/23 0414 04/06/23 0000 08/04/23 1011  WBC 13.1*   < > 11.0* 10.1 10.3 10.5  NEUTROABS 9.8*  --   --   --  7,025.00 7.2  HGB 12.6   < > 10.6* 10.9* 11.3* 12.7  HCT 40.5   < > 34.1* 35.1* 36 41.4  MCV 91.4   < > 90.9 92.9  --  85.4  PLT 244   < > 278 312 264 241   < > = values in this interval not displayed.   Lab Results  Component Value Date   TSH 3.77 04/16/2023   Lab Results  Component Value Date   HGBA1C 7.6 09/08/2023   Lab Results  Component Value Date   CHOL 110 12/15/2021   HDL 19 (L) 12/15/2021   LDLCALC 46 12/15/2021   TRIG 223 (H) 12/15/2021   CHOLHDL 5.8 12/15/2021    Significant Diagnostic Results in last 30 days:  No results found.  Assessment/Plan Chronic idiopathic constipation Controlled on current regimen  Depression Stable on paxil , continue current regimen  Hyperthyroidism Methimazole  on hold at this time and following TSH.  Osteoarthritis of right knee Ongoing pain, continues on tylenol  and voltaren  gel   Paroxysmal atrial fibrillation (HCC) Stable Continue metoprolol  for rate control Continue Pradaxa    Primary hypertension Blood pressure well controlled, goal bp <140/90 Continue current medications and dietary modifications follow metabolic panel   Type 2 diabetes mellitus with peripheral neuropathy (HCC) A1c elevated with elevated blood sugars Increase metformin  to 1000 mg BID  Dietary modifications encouraged.   Venous insufficiency of right leg Stable, continue compression hose and elevation      Nivaan Dicenzo K. Caro BODILY [facility] [phone]

## 2023-10-29 DIAGNOSIS — M6281 Muscle weakness (generalized): Secondary | ICD-10-CM | POA: Diagnosis not present

## 2023-11-04 ENCOUNTER — Encounter: Payer: Self-pay | Admitting: Nurse Practitioner

## 2023-11-04 DIAGNOSIS — I872 Venous insufficiency (chronic) (peripheral): Secondary | ICD-10-CM | POA: Insufficient documentation

## 2023-11-04 DIAGNOSIS — M6281 Muscle weakness (generalized): Secondary | ICD-10-CM | POA: Diagnosis not present

## 2023-11-04 DIAGNOSIS — K5904 Chronic idiopathic constipation: Secondary | ICD-10-CM | POA: Insufficient documentation

## 2023-11-04 DIAGNOSIS — M1711 Unilateral primary osteoarthritis, right knee: Secondary | ICD-10-CM | POA: Insufficient documentation

## 2023-11-04 HISTORY — DX: Chronic idiopathic constipation: K59.04

## 2023-11-04 NOTE — Assessment & Plan Note (Signed)
 Methimazole  on hold at this time and following TSH.

## 2023-11-04 NOTE — Assessment & Plan Note (Signed)
 Controlled on current regimen.

## 2023-11-04 NOTE — Assessment & Plan Note (Signed)
 Ongoing pain, continues on tylenol  and voltaren  gel

## 2023-11-04 NOTE — Assessment & Plan Note (Signed)
 Stable Continue metoprolol  for rate control Continue Pradaxa 

## 2023-11-04 NOTE — Assessment & Plan Note (Signed)
 A1c elevated with elevated blood sugars Increase metformin  to 1000 mg BID  Dietary modifications encouraged.

## 2023-11-04 NOTE — Assessment & Plan Note (Signed)
 Blood pressure well controlled, goal bp <140/90 Continue current medications and dietary modifications follow metabolic panel

## 2023-11-04 NOTE — Assessment & Plan Note (Signed)
Stable on paxil, continue current regimen

## 2023-11-04 NOTE — Assessment & Plan Note (Signed)
 Stable, continue compression hose and elevation

## 2023-11-06 DIAGNOSIS — M6281 Muscle weakness (generalized): Secondary | ICD-10-CM | POA: Diagnosis not present

## 2023-11-09 ENCOUNTER — Encounter: Payer: Self-pay | Admitting: Adult Health

## 2023-11-09 ENCOUNTER — Non-Acute Institutional Stay (SKILLED_NURSING_FACILITY): Payer: Self-pay | Admitting: Adult Health

## 2023-11-09 DIAGNOSIS — E1169 Type 2 diabetes mellitus with other specified complication: Secondary | ICD-10-CM

## 2023-11-09 DIAGNOSIS — L0292 Furuncle, unspecified: Secondary | ICD-10-CM | POA: Diagnosis not present

## 2023-11-09 DIAGNOSIS — I10 Essential (primary) hypertension: Secondary | ICD-10-CM

## 2023-11-09 NOTE — Progress Notes (Signed)
 Location:  Other ([facility] Room Number: 215 A Place of Service:  SNF ([phone]) Provider:  Medina-Vargas, Sonu Kruckenberg, DNP, FNP-BC  Patient Care Team: Abdul Fine, MD as PCP - General (Family Medicine) Dessa, Reyes ORN, MD (General Surgery) Jacobo Evalene PARAS, MD as Consulting Physician (Oncology)  Extended Emergency Contact Information Primary Emergency Contact: Kelsie Maryruth FALCON Address: [address]          [place], [place] [address] [place] Home Phone: [phone] Mobile Phone: [phone] Relation: Daughter  Code Status:   DNR  Goals of care: Advanced Directive information    10/27/2023   12:23 PM  Advanced Directives  Does Patient Have a Medical Advance Directive? Yes  Type of Estate agent of [employer];Out of facility DNR (pink MOST or yellow form)  Does patient want to make changes to medical advance directive? No - Patient declined  Copy of Healthcare Power of Attorney in Chart? Yes - validated most recent copy scanned in chart (See row information)     Chief Complaint  Patient presents with   Acute Visit    Boil on labia    HPI:  Pt is a 89 y.o. female seen today for an acute visit regarding boil. She is a resident of [facility]. She was noted to have 2 bumps with pus on her right labia. No reported fever.   He has diabetes mellitus with latest A1C  7.6. She takes Metformin  1,000 mg BID and Glipizide  XL 2.5 mg daily.  BP 132/62. She takes Metoprolol  tartrate 25 mg BID for hypertension.   Past Medical History:  Diagnosis Date   Anxiety    Benign breast lumps    Blood clotting disorder (HCC)    Bone cancer (HCC)    head of femur, left leg ... 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  Oncotype recurrence score: 1.  DCIS, margin less than 0.5 mm.  Negative  sentinel node.  Accelerated partial breast radiation.   Cervicalgia    Chronic kidney disease    kidney stones   Colon cancer [facility])    patient unaware of this   Complication of anesthesia    mood alteration / not sure if d/t pain medicine or anesthesia as she passed out    Cough    NASAL DRIP / SNEEZING / SORE THROAT MOSTLY CONSTANT   Depression    Diabetes (HCC)    Diverticulitis    Dizzy    DVT (deep venous thrombosis) (HCC) 07/27/2017   GERD (gastroesophageal reflux disease)    H/O blood clots    arm and leg   HBP (high blood pressure)    Hematuria    gross   Hemorrhoids    HLD (hyperlipidemia)    Microscopic hematuria 06/02/2015   Muscle pain    Osteoarthritis    Osteoporosis    Panic disorder    Personal history of radiation therapy    PND (post-nasal drip)    CHRONIC WITH SORE THROAT AND SNEEZING   Reflux    Sepsis (HCC)    Sepsis due to pneumonia (HCC) 03/20/2023   Sepsis, unspecified organism (HCC) 12/15/2021   Formatting of this note might be different from the original.  Last Assessment & Plan: Formatting of this note might be different from the original. Appears to be multifactorial and secondary  to healthcare associated pneumonia as well as a UTI CT angio shows asymmetric airspace disease at the left base and posteriorly in the right upper lobe concerning for pneumonia Patient also has pyuria Patient   Severe sepsis (HCC) 12/15/2021   Skin cancer    nose   Swelling    Tremor    Tremors of nervous system    Ureteral stone with hydronephrosis 11/26/2018   Past Surgical History:  Procedure Laterality Date   APPENDECTOMY  1962   BREAST BIOPSY Right 2006?   benign   BREAST BIOPSY Left 2019   invasive mammary carcinoma   BREAST CYST EXCISION Left 07/27/2017   Procedure: SKIN CYST EXCISED;  Surgeon: Dessa Reyes ORN, MD;  Location: [facility] ORS;  Service: General;  Laterality: Left;   BREAST LUMPECTOMY Left 07/2017   invasive mammary, DCIS  mammosite   BREAST  SURGERY Right 2019   CARPAL TUNNEL RELEASE Right    CATARACT EXTRACTION W/PHACO Left 11/11/2016   Procedure: CATARACT EXTRACTION PHACO AND INTRAOCULAR LENS PLACEMENT (IOC);  Surgeon: Jaye Fallow, MD;  Location: [facility] ORS;  Service: Ophthalmology;  Laterality: Left;  US  01:07.9 AP% 19.2 CDE 13.02 Fluid pack lot # [id number]   CATARACT EXTRACTION W/PHACO Right 12/09/2016   Procedure: CATARACT EXTRACTION PHACO AND INTRAOCULAR LENS PLACEMENT (IOC);  Surgeon: Jaye Fallow, MD;  Location: [facility] ORS;  Service: Ophthalmology;  Laterality: Right;  US  00:49 AP% 21.2 CDE 10.39 Fluid pack lot # [id number]   CHOLECYSTECTOMY     COLONOSCOPY WITH PROPOFOL  N/A 11/22/2014   Procedure: COLONOSCOPY WITH PROPOFOL ;  Surgeon: Lamar ONEIDA Holmes, MD;  Location: [facility] ENDOSCOPY;  Service: Endoscopy;  Laterality: N/A;   CYSTOSCOPY WITH STENT PLACEMENT Bilateral 11/27/2018   Procedure: CYSTOSCOPY WITH STENT PLACEMENT;  Surgeon: Nieves Cough, MD;  Location: [facility] ORS;  Service: Urology;  Laterality: Bilateral;   CYSTOSCOPY/URETEROSCOPY/HOLMIUM LASER/STENT PLACEMENT Bilateral 12/17/2018   Procedure: CYSTOSCOPY/URETEROSCOPY/HOLMIUM LASER/STENT Exchange;  Surgeon: Francisca Redell BROCKS, MD;  Location: [facility] ORS;  Service: Urology;  Laterality: Bilateral;   ESOPHAGOGASTRODUODENOSCOPY  11/22/2014   Procedure: ESOPHAGOGASTRODUODENOSCOPY (EGD);  Surgeon: Lamar ONEIDA Holmes, MD;  Location: [facility] ENDOSCOPY;  Service: Endoscopy;;   ESOPHAGOGASTRODUODENOSCOPY (EGD) WITH PROPOFOL  N/A 03/05/2017   Procedure: ESOPHAGOGASTRODUODENOSCOPY (EGD) WITH PROPOFOL ;  Surgeon: Unk Corinn Skiff, MD;  Location: [facility] ENDOSCOPY;  Service: Gastroenterology;  Laterality: N/A;   EXTRACORPOREAL SHOCK WAVE LITHOTRIPSY Right 03/04/2018   Procedure: EXTRACORPOREAL SHOCK WAVE LITHOTRIPSY (ESWL);  Surgeon: Francisca Redell BROCKS, MD;  Location: [facility] ORS;  Service: Urology;  Laterality: Right;   EYE SURGERY Bilateral 2012   cataract extraction with iol   GALLBLADDER  SURGERY  1968   hemi pelvectomy     left side at age 15   HEMIPELVIC Left 1962   LEG SURGERY Left    AMPUTATION d/t cancer at 89 years old   MASTECTOMY, PARTIAL Left 07/27/2017   Procedure: MASTECTOMY PARTIAL;  Surgeon: Dessa Reyes ORN, MD;  Location: [facility] ORS;  Service: General;  Laterality: Left;   MOUTH SURGERY  2019   teeth pulled with a bridge insertion.   fell out 12/16/18   OVARY SURGERY Right    cyst removed   SAVORY DILATION  11/22/2014   Procedure: SAVORY DILATION;  Surgeon: Lamar ONEIDA Holmes, MD;  Location: [facility] ENDOSCOPY;  Service: Endoscopy;;   SENTINEL NODE BIOPSY Left 07/27/2017   Procedure: SENTINEL NODE BIOPSY;  Surgeon: Dessa Reyes ORN, MD;  Location: [facility] ORS;  Service: General;  Laterality: Left;   SKIN CANCER EXCISION     nose  and arm and face   TEE WITHOUT CARDIOVERSION N/A 07/25/2019   Procedure: TRANSESOPHAGEAL ECHOCARDIOGRAM (TEE);  Surgeon: Hester Wolm PARAS, MD;  Location: [facility] ORS;  Service: Cardiovascular;  Laterality: N/A;   TONSILLECTOMY      Allergies  Allergen Reactions   Ciprofloxacin  Hives   Codeine Other (See Comments)    HALLUCINATIONS   Tape     Rash and skin irritation/ paper tape and tegaderm OK   Ciprocinonide [Fluocinolone] Other (See Comments)    unknown   Amoxicillin Other (See Comments)    Upset stomach Has patient had a PCN reaction causing immediate rash, facial/tongue/throat swelling, SOB or lightheadedness with hypotension: No Has patient had a PCN reaction causing severe rash involving mucus membranes or skin necrosis: No Has patient had a PCN reaction that required hospitalization: No Has patient had a PCN reaction occurring within the last 10 years: Yes If all of the above answers are NO, then may proceed with Cephalosporin use.;   Cefuroxime  Other (See Comments)    OK INTRACAMERALLY PER DR WLP, upset stomach   Latex Rash    Rast test NEGATIVE   Lipitor [Atorvastatin] Other (See Comments)    unknown    Outpatient  Encounter Medications as of 11/09/2023  Medication Sig   acetaminophen  (TYLENOL ) 650 MG CR tablet Take 650 mg by mouth every 8 (eight) hours as needed for pain.   Cholecalciferol  (VITAMIN D -1000 MAX ST) 25 MCG (1000 UT) tablet Take 1,000 Units by mouth daily.   dextromethorphan-guaiFENesin  (ROBITUSSIN-DM) 10-100 MG/5ML liquid Take 10 mLs by mouth every 4 (four) hours as needed for cough.   diclofenac  Sodium (VOLTAREN ) 1 % GEL Apply 4 g topically. Apply to stump topically every 6 hours as needed. Apply to right knee topically three times daily for pain.   fluticasone  (FLONASE ) 50 MCG/ACT nasal spray Place 2 sprays into both nostrils 2 (two) times daily.   gabapentin  (NEURONTIN ) 100 MG capsule Take 100 mg by mouth 3 (three) times daily.   GLIPIZIDE  XL 2.5 MG 24 hr tablet Take 2.5 mg by mouth daily.    hydrocortisone 2.5 % cream Apply to Hemorrhoids/buttucks topically every 8 hours as needed for hemorrhoids.   ipratropium-albuterol  (DUONEB) 0.5-2.5 (3) MG/3ML SOLN Take 3 mLs by nebulization every 4 (four) hours as needed.   loratadine  (CLARITIN ) 10 MG tablet Take 10 mg by mouth daily.   melatonin 3 MG TABS tablet Take 3 mg by mouth at bedtime.   metFORMIN  (GLUCOPHAGE ) 500 MG tablet Take 750 mg by mouth 2 (two) times daily with a meal.   metoprolol  tartrate (LOPRESSOR ) 25 MG tablet Take 25 mg by mouth 2 (two) times daily.   ondansetron  (ZOFRAN ) 4 MG tablet Take 4 mg by mouth every 4 (four) hours as needed for nausea or vomiting.   OXYGEN 2lpm as needed for sats less than 88% every 1 hour as needed   pantoprazole  (PROTONIX ) 40 MG tablet Take 40 mg by mouth daily.   PARoxetine  (PAXIL ) 30 MG tablet Take 30 mg by mouth daily.   Polyethyl Glycol-Propyl Glycol 0.4-0.3 % SOLN Apply to eye 2 (two) times daily as needed.   polyethylene glycol (MIRALAX  / GLYCOLAX ) 17 g packet Take 17 g by mouth daily as needed.   PRADAXA  150 MG CAPS capsule TAKE 1 CAPSULE BY MOUTH TWICE DAILY START TREATMENT 10 HRS LAST DOSE OF  LOVENOX    SUNSCREEN SPF50 EX Apply topically. Apply to exposed skin topically every 2 hours as needed for Sunburn Prevention  tiZANidine  (ZANAFLEX ) 2 MG tablet Take 2 mg by mouth every 12 (twelve) hours as needed for muscle spasms.   No facility-administered encounter medications on file as of 11/09/2023.    Review of Systems  Constitutional:  Negative for appetite change, chills, fatigue and fever.  HENT:  Negative for congestion, hearing loss, rhinorrhea and sore throat.   Eyes: Negative.   Respiratory:  Negative for cough, shortness of breath and wheezing.   Cardiovascular:  Negative for chest pain, palpitations and leg swelling.  Gastrointestinal:  Negative for abdominal pain, constipation, diarrhea, nausea and vomiting.  Genitourinary:  Negative for dysuria.  Musculoskeletal:  Negative for arthralgias, back pain and myalgias.  Skin:  Negative for color change, rash and wound.  Neurological:  Negative for dizziness, weakness and headaches.  Psychiatric/Behavioral:  Negative for behavioral problems. The patient is not nervous/anxious.        Immunization History  Administered Date(s) Administered   Influenza, High Dose Seasonal PF 03/07/2023   Influenza-Unspecified 02/03/2020, 02/18/2021, 02/18/2022   Moderna Covid-19 Fall Seasonal Vaccine 11yrs & older 08/12/2022   PNEUMOCOCCAL CONJUGATE-20 01/17/2023   Pneumococcal Conjugate-13 09/28/2017   Tdap 12/31/2022   Unspecified SARS-COV-2 Vaccination 05/20/2019, 05/20/2019, 06/17/2019, 03/16/2020, 09/20/2020, 01/25/2021, 01/30/2023, 03/07/2023   Pertinent  Health Maintenance Due  Topic Date Due   OPHTHALMOLOGY EXAM  10/30/2023   INFLUENZA VACCINE  12/04/2023   FOOT EXAM  12/11/2023   HEMOGLOBIN A1C  03/10/2024   DEXA SCAN  Completed      12/18/2021    7:45 PM 12/19/2021    7:00 AM 12/19/2021   12:00 PM 01/24/2022   11:20 AM 12/11/2022    1:31 PM  Fall Risk  Falls in the past year?     0  (RETIRED) Patient Fall Risk Level High  fall risk  High fall risk  High fall risk  High fall risk       Data saved with a previous flowsheet row definition     Vitals:   11/09/23 1726  BP: (!) 140/48  Pulse: 64  Resp: 18  Temp: 97.8 F (36.6 C)  SpO2: 93%  Weight: 157 lb (71.2 kg)  Height: 4' 10 (1.473 m)   Body mass index is 32.81 kg/m.  Physical Exam Constitutional:      Appearance: She is obese.  HENT:     Head: Normocephalic and atraumatic.     Nose: Nose normal.     Mouth/Throat:     Mouth: Mucous membranes are moist.  Eyes:     Conjunctiva/sclera: Conjunctivae normal.  Cardiovascular:     Rate and Rhythm: Normal rate and regular rhythm.  Pulmonary:     Effort: Pulmonary effort is normal.     Breath sounds: Normal breath sounds.  Abdominal:     General: Bowel sounds are normal.     Palpations: Abdomen is soft.  Genitourinary:    Comments:  2 bumps with pus on her right labia Musculoskeletal:     Cervical back: Normal range of motion.     Comments: S/P left hemi pelvectomy  Skin:    General: Skin is warm and dry.  Neurological:     Mental Status: She is alert and oriented to person, place, and time.  Psychiatric:        Mood and Affect: Mood normal.        Behavior: Behavior normal.        Thought Content: Thought content normal.        Judgment: Judgment normal.  Labs reviewed: Recent Labs    03/28/23 0426 03/29/23 0414 03/30/23 0642 04/06/23 0000 07/06/23 0000 08/04/23 1011 09/03/23 1116  NA 138 138 137   < > 137 137 138  K 4.2 4.4 5.5*   < > 4.7 4.0 4.1  CL 101 103 103   < > 103 102 104  CO2 29 27 25    < > 28* 25 24  GLUCOSE 177* 173* 202*  --   --  191* 175*  BUN 11 10 8    < > 14 14 16   CREATININE 0.49 0.50 0.62   < > 0.5 0.71 0.62  CALCIUM 8.7* 9.1 9.3   < > 10.9* 11.0* 10.2  MG 1.5* 1.9 1.7  --   --   --   --   PHOS 2.5 1.9* 2.4*  --   --   --   --    < > = values in this interval not displayed.   Recent Labs    03/21/23 0502 03/22/23 0500 03/23/23 0507  AST  18 15 22   ALT 10 12 14   ALKPHOS 45 46 53  BILITOT 0.4 <0.2 0.4  PROT 5.7* 5.9* 6.3*  ALBUMIN 2.8* 2.7* 2.8*   Recent Labs    03/19/23 1849 03/21/23 0502 03/28/23 0426 03/29/23 0414 04/06/23 0000 08/04/23 1011  WBC 13.1*   < > 11.0* 10.1 10.3 10.5  NEUTROABS 9.8*  --   --   --  7,025.00 7.2  HGB 12.6   < > 10.6* 10.9* 11.3* 12.7  HCT 40.5   < > 34.1* 35.1* 36 41.4  MCV 91.4   < > 90.9 92.9  --  85.4  PLT 244   < > 278 312 264 241   < > = values in this interval not displayed.   Lab Results  Component Value Date   TSH 3.77 04/16/2023   Lab Results  Component Value Date   HGBA1C 7.6 09/08/2023   Lab Results  Component Value Date   CHOL 110 12/15/2021   HDL 19 (L) 12/15/2021   LDLCALC 46 12/15/2021   TRIG 223 (H) 12/15/2021   CHOLHDL 5.8 12/15/2021    Significant Diagnostic Results in last 30 days:  No results found.  Assessment/Plan  1. Boil (Primary) -  2 bumps with pus on her right labia -   will start on Bactroban 2% apply topically to right labia BID X 14 days -  Warm compress to right labia BID X 14 days  2. Type 2 diabetes mellitus with other specified complication, without long-term current use of insulin  (HCC) Lab Results  Component Value Date   HGBA1C 7.6 09/08/2023    -  continue metformin  1000 mg twice a day - Continue glipizide  XL 2.5 mg daily    3. Primary hypertension -BP stable -Continue metoprolol  titrate 25 mg  twice a day    Family/ staff Communication: Discussed plan of care with resident and charge nurse.  Labs/tests ordered:  None    Nile Dorning Medina-Vargas, DNP, MSN, FNP-BC [facility] [phone] (Monday-Friday 8:00 a.m. - 5:00 p.m.) [phone] (after hours)

## 2023-11-12 DIAGNOSIS — Z961 Presence of intraocular lens: Secondary | ICD-10-CM | POA: Diagnosis not present

## 2023-11-12 DIAGNOSIS — E119 Type 2 diabetes mellitus without complications: Secondary | ICD-10-CM | POA: Diagnosis not present

## 2023-11-12 DIAGNOSIS — H43813 Vitreous degeneration, bilateral: Secondary | ICD-10-CM | POA: Diagnosis not present

## 2023-11-12 DIAGNOSIS — H4912 Fourth [trochlear] nerve palsy, left eye: Secondary | ICD-10-CM | POA: Diagnosis not present

## 2023-11-12 LAB — HM DIABETES EYE EXAM

## 2023-11-18 DIAGNOSIS — M6281 Muscle weakness (generalized): Secondary | ICD-10-CM | POA: Diagnosis not present

## 2023-11-20 DIAGNOSIS — M6281 Muscle weakness (generalized): Secondary | ICD-10-CM | POA: Diagnosis not present

## 2023-11-23 ENCOUNTER — Other Ambulatory Visit: Payer: Self-pay | Admitting: Student

## 2023-11-23 DIAGNOSIS — F3341 Major depressive disorder, recurrent, in partial remission: Secondary | ICD-10-CM

## 2023-11-23 MED ORDER — LORAZEPAM 1 MG PO TABS: 1.0000 mg | ORAL_TABLET | Freq: Once | ORAL | 0 refills | Status: AC

## 2023-11-30 ENCOUNTER — Encounter: Payer: Self-pay | Admitting: Student

## 2023-11-30 ENCOUNTER — Non-Acute Institutional Stay (SKILLED_NURSING_FACILITY): Admitting: Student

## 2023-11-30 DIAGNOSIS — M810 Age-related osteoporosis without current pathological fracture: Secondary | ICD-10-CM | POA: Diagnosis not present

## 2023-11-30 DIAGNOSIS — E1142 Type 2 diabetes mellitus with diabetic polyneuropathy: Secondary | ICD-10-CM | POA: Diagnosis not present

## 2023-11-30 DIAGNOSIS — L719 Rosacea, unspecified: Secondary | ICD-10-CM | POA: Diagnosis not present

## 2023-11-30 DIAGNOSIS — E0500 Thyrotoxicosis with diffuse goiter without thyrotoxic crisis or storm: Secondary | ICD-10-CM

## 2023-11-30 DIAGNOSIS — E213 Hyperparathyroidism, unspecified: Secondary | ICD-10-CM | POA: Diagnosis not present

## 2023-11-30 DIAGNOSIS — K029 Dental caries, unspecified: Secondary | ICD-10-CM | POA: Diagnosis not present

## 2023-11-30 DIAGNOSIS — S88912S Complete traumatic amputation of left lower leg, level unspecified, sequela: Secondary | ICD-10-CM

## 2023-11-30 NOTE — Progress Notes (Signed)
 Location:  Other Nursing Home Room Number: [facility] 784J Place of Service:  SNF [phone]) Provider:  Abdul Abdul, Richerd, MD  Patient Care Team: Abdul Richerd, MD as PCP - General (Family Medicine) Dessa, Reyes ORN, MD (General Surgery) Jacobo Evalene PARAS, MD as Consulting Physician (Oncology)  Extended Emergency Contact Information Primary Emergency Contact: Kelsie Maryruth FALCON Address: [address]          [place], [place] [address] [place] Home Phone: [phone] Mobile Phone: [phone] Relation: Daughter  Code Status:  DNR Goals of care: Advanced Directive information    10/27/2023   12:23 PM  Advanced Directives  Does Patient Have a Medical Advance Directive? Yes  Type of Estate agent of [place];Out of facility DNR (pink MOST or yellow form)  Does patient want to make changes to medical advance directive? No - Patient declined  Copy of Healthcare Power of Attorney in Chart? Yes - validated most recent copy scanned in chart (See row information)     Chief Complaint  Patient presents with   Medical Management of Chronic Issues   Rash    HPI:  Pt is a 89 y.o. female seen today for a routine visit. Discussed the use of AI scribe software for clinical note transcription with the patient, who gave verbal consent to proceed.  History of Present Illness  History of Present Illness The patient presents with a facial rash and dental issues.  A facial rash began yesterday without recent sun exposure. She has a history of skin cancer on her nose. She used a moisturizer a few times over the past two to three weeks due to dry skin but has not used it in the last two days. She is unsure of the brand, having used it approximately three times.  She has dental issues, specifically fractures in her upper teeth, attributed to eating bacon and using a dental tool. She reported that all her top teeth are supposed to be pulled and some  work done on the bottom teeth, but she does not recall the exact schedule for the procedure. Burnard, her aide, accompanied her to the dentist and might have more information.  She has a history of hyperparathyroidism, which is being monitored. A previous endocrinology visit in June 2025 addressed her hyperparathyroidism. Her serum calcium was stable at 2.2. She has been treated with Prolia  for osteoporosis and Femara , which has a modest effect on hypercalcemia. Her endocrinologist is retiring, and she is transitioning to a new doctor for management.  She has a history of diabetes, with her last A1c recorded as 7.6 in May 2025. She is not currently experiencing any pain except occasional knee and back pain, attributed to movement and cushion changes. No issues with urination or bowel movements.   Social History - Partner Status: Married - Living Situation: Lives in an apartment since 2008 - Has a daughter and two adopted children. Husband did a residency at [employer] in rheumatology. Originally from [place], [place] .      Past Medical History:  Diagnosis Date   Anxiety    Benign breast lumps    Blood clotting disorder (HCC)    Bone cancer (HCC)    head of femur, left leg ... 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  Oncotype recurrence score: 1.  DCIS, margin less than 0.5 mm.  Negative sentinel node.  Accelerated partial breast radiation.   Cervicalgia    Chronic kidney disease    kidney stones   Colon cancer [facility])    patient unaware of this   Complication of anesthesia    mood alteration / not sure if d/t pain medicine or anesthesia as she passed out    Cough    NASAL DRIP / SNEEZING / SORE THROAT MOSTLY CONSTANT   Depression    Diabetes (HCC)    Diverticulitis    Dizzy    DVT (deep venous thrombosis) (HCC) 07/27/2017   GERD (gastroesophageal  reflux disease)    H/O blood clots    arm and leg   HBP (high blood pressure)    Hematuria    gross   Hemorrhoids    HLD (hyperlipidemia)    Microscopic hematuria 06/02/2015   Muscle pain    Osteoarthritis    Osteoporosis    Panic disorder    Personal history of radiation therapy    PND (post-nasal drip)    CHRONIC WITH SORE THROAT AND SNEEZING   Reflux    Sepsis (HCC)    Sepsis due to pneumonia (HCC) 03/20/2023   Sepsis, unspecified organism (HCC) 12/15/2021   Formatting of this note might be different from the original.  Last Assessment & Plan: Formatting of this note might be different from the original. Appears to be multifactorial and secondary to healthcare associated pneumonia as well as a UTI CT angio shows asymmetric airspace disease at the left base and posteriorly in the right upper lobe concerning for pneumonia Patient also has pyuria Patient   Severe sepsis (HCC) 12/15/2021   Skin cancer    nose   Swelling    Tremor    Tremors of nervous system    Ureteral stone with hydronephrosis 11/26/2018   Past Surgical History:  Procedure Laterality Date   APPENDECTOMY  1962   BREAST BIOPSY Right 2006?   benign   BREAST BIOPSY Left 2019   invasive mammary carcinoma   BREAST CYST EXCISION Left 07/27/2017   Procedure: SKIN CYST EXCISED;  Surgeon: Dessa Reyes ORN, MD;  Location: [facility] ORS;  Service: General;  Laterality: Left;   BREAST LUMPECTOMY Left 07/2017   invasive mammary, DCIS  mammosite   BREAST SURGERY Right 2019   CARPAL TUNNEL RELEASE Right    CATARACT EXTRACTION W/PHACO Left 11/11/2016   Procedure: CATARACT EXTRACTION PHACO AND INTRAOCULAR LENS PLACEMENT (IOC);  Surgeon: Jaye Fallow, MD;  Location: [facility] ORS;  Service: Ophthalmology;  Laterality: Left;  US  01:07.9 AP% 19.2 CDE 13.02 Fluid pack lot # [id number]   CATARACT EXTRACTION W/PHACO Right 12/09/2016   Procedure: CATARACT EXTRACTION PHACO AND INTRAOCULAR LENS PLACEMENT (IOC);  Surgeon: Jaye Fallow, MD;  Location: [facility] ORS;  Service: Ophthalmology;  Laterality: Right;  US  00:49 AP% 21.2 CDE 10.39 Fluid pack lot # [id number]   CHOLECYSTECTOMY     COLONOSCOPY WITH PROPOFOL  N/A 11/22/2014   Procedure: COLONOSCOPY WITH PROPOFOL ;  Surgeon: Lamar ONEIDA Holmes, MD;  Location: [facility] ENDOSCOPY;  Service: Endoscopy;  Laterality: N/A;   CYSTOSCOPY WITH STENT PLACEMENT Bilateral 11/27/2018   Procedure: CYSTOSCOPY WITH STENT PLACEMENT;  Surgeon: Nieves Cough, MD;  Location: [facility] ORS;  Service: Urology;  Laterality: Bilateral;   CYSTOSCOPY/URETEROSCOPY/HOLMIUM LASER/STENT PLACEMENT Bilateral 12/17/2018   Procedure: CYSTOSCOPY/URETEROSCOPY/HOLMIUM LASER/STENT Exchange;  Surgeon: Francisca Redell BROCKS, MD;  Location: [facility] ORS;  Service: Urology;  Laterality: Bilateral;   ESOPHAGOGASTRODUODENOSCOPY  11/22/2014   Procedure: ESOPHAGOGASTRODUODENOSCOPY (EGD);  Surgeon: Lamar ONEIDA Holmes,  MD;  Location: [facility] ENDOSCOPY;  Service: Endoscopy;;   ESOPHAGOGASTRODUODENOSCOPY (EGD) WITH PROPOFOL  N/A 03/05/2017   Procedure: ESOPHAGOGASTRODUODENOSCOPY (EGD) WITH PROPOFOL ;  Surgeon: Unk Corinn Skiff, MD;  Location: [facility] ENDOSCOPY;  Service: Gastroenterology;  Laterality: N/A;   EXTRACORPOREAL SHOCK WAVE LITHOTRIPSY Right 03/04/2018   Procedure: EXTRACORPOREAL SHOCK WAVE LITHOTRIPSY (ESWL);  Surgeon: Francisca Redell BROCKS, MD;  Location: [facility] ORS;  Service: Urology;  Laterality: Right;   EYE SURGERY Bilateral 2012   cataract extraction with iol   GALLBLADDER SURGERY  1968   hemi pelvectomy     left side at age 60   HEMIPELVIC Left 1962   LEG SURGERY Left    AMPUTATION d/t cancer at 89 years old   MASTECTOMY, PARTIAL Left 07/27/2017   Procedure: MASTECTOMY PARTIAL;  Surgeon: Dessa Reyes ORN, MD;  Location: [facility] ORS;  Service: General;  Laterality: Left;   MOUTH SURGERY  2019   teeth pulled with a bridge insertion.   fell out 12/16/18   OVARY SURGERY Right    cyst removed   SAVORY DILATION  11/22/2014   Procedure:  SAVORY DILATION;  Surgeon: Lamar ONEIDA Holmes, MD;  Location: [facility] ENDOSCOPY;  Service: Endoscopy;;   SENTINEL NODE BIOPSY Left 07/27/2017   Procedure: SENTINEL NODE BIOPSY;  Surgeon: Dessa Reyes ORN, MD;  Location: [facility] ORS;  Service: General;  Laterality: Left;   SKIN CANCER EXCISION     nose and arm and face   TEE WITHOUT CARDIOVERSION N/A 07/25/2019   Procedure: TRANSESOPHAGEAL ECHOCARDIOGRAM (TEE);  Surgeon: Hester Wolm PARAS, MD;  Location: [facility] ORS;  Service: Cardiovascular;  Laterality: N/A;   TONSILLECTOMY      Allergies  Allergen Reactions   Ciprofloxacin  Hives   Codeine Other (See Comments)    HALLUCINATIONS   Tape     Rash and skin irritation/ paper tape and tegaderm OK   Ciprocinonide [Fluocinolone] Other (See Comments)    unknown   Amoxicillin Other (See Comments)    Upset stomach Has patient had a PCN reaction causing immediate rash, facial/tongue/throat swelling, SOB or lightheadedness with hypotension: No Has patient had a PCN reaction causing severe rash involving mucus membranes or skin necrosis: No Has patient had a PCN reaction that required hospitalization: No Has patient had a PCN reaction occurring within the last 10 years: Yes If all of the above answers are NO, then may proceed with Cephalosporin use.;   Cefuroxime  Other (See Comments)    OK INTRACAMERALLY PER DR WLP, upset stomach   Latex Rash    Rast test NEGATIVE   Lipitor [Atorvastatin] Other (See Comments)    unknown    Outpatient Encounter Medications as of 11/30/2023  Medication Sig   LORazepam  (ATIVAN ) 1 MG tablet Take by mouth.   mupirocin ointment (BACTROBAN) 2 % Apply 1 Application topically 3 (three) times daily.   acetaminophen  (TYLENOL ) 650 MG CR tablet Take 650 mg by mouth every 8 (eight) hours as needed for pain.   Cholecalciferol  (VITAMIN D -1000 MAX ST) 25 MCG (1000 UT) tablet Take 1,000 Units by mouth daily.   dextromethorphan-guaiFENesin  (ROBITUSSIN-DM) 10-100 MG/5ML liquid Take 10  mLs by mouth every 4 (four) hours as needed for cough.   diclofenac  Sodium (VOLTAREN ) 1 % GEL Apply 4 g topically. Apply to stump topically every 6 hours as needed. Apply to right knee topically three times daily for pain.   fluticasone  (FLONASE ) 50 MCG/ACT nasal spray Place 2 sprays into both nostrils 2 (two) times daily.   gabapentin  (NEURONTIN ) 100 MG capsule Take  100 mg by mouth 3 (three) times daily.   GLIPIZIDE  XL 2.5 MG 24 hr tablet Take 2.5 mg by mouth daily.    hydrocortisone 2.5 % cream Apply to Hemorrhoids/buttucks topically every 8 hours as needed for hemorrhoids.   ipratropium-albuterol  (DUONEB) 0.5-2.5 (3) MG/3ML SOLN Take 3 mLs by nebulization every 4 (four) hours as needed.   loratadine  (CLARITIN ) 10 MG tablet Take 10 mg by mouth daily.   melatonin 3 MG TABS tablet Take 3 mg by mouth at bedtime.   metFORMIN  (GLUCOPHAGE ) 500 MG tablet Take 750 mg by mouth 2 (two) times daily with a meal.   metoprolol  tartrate (LOPRESSOR ) 25 MG tablet Take 25 mg by mouth 2 (two) times daily.   ondansetron  (ZOFRAN ) 4 MG tablet Take 4 mg by mouth every 4 (four) hours as needed for nausea or vomiting.   OXYGEN 2lpm as needed for sats less than 88% every 1 hour as needed   pantoprazole  (PROTONIX ) 40 MG tablet Take 40 mg by mouth daily.   PARoxetine  (PAXIL ) 30 MG tablet Take 30 mg by mouth daily.   Polyethyl Glycol-Propyl Glycol 0.4-0.3 % SOLN Apply to eye 2 (two) times daily as needed.   polyethylene glycol (MIRALAX  / GLYCOLAX ) 17 g packet Take 17 g by mouth daily as needed.   PRADAXA  150 MG CAPS capsule TAKE 1 CAPSULE BY MOUTH TWICE DAILY START TREATMENT 10 HRS LAST DOSE OF LOVENOX    SUNSCREEN SPF50 EX Apply topically. Apply to exposed skin topically every 2 hours as needed for Sunburn Prevention   tiZANidine  (ZANAFLEX ) 2 MG tablet Take 2 mg by mouth every 12 (twelve) hours as needed for muscle spasms.   No facility-administered encounter medications on file as of 11/30/2023.    Review of  Systems  Immunization History  Administered Date(s) Administered   Influenza, High Dose Seasonal PF 03/07/2023   Influenza-Unspecified 02/03/2020, 02/18/2021, 02/18/2022   Moderna Covid-19 Fall Seasonal Vaccine 62yrs & older 08/12/2022   PNEUMOCOCCAL CONJUGATE-20 01/17/2023   Pneumococcal Conjugate-13 09/28/2017   Tdap 12/31/2022   Unspecified SARS-COV-2 Vaccination 05/20/2019, 05/20/2019, 06/17/2019, 03/16/2020, 09/20/2020, 01/25/2021, 01/30/2023, 03/07/2023   Pertinent  Health Maintenance Due  Topic Date Due   OPHTHALMOLOGY EXAM  10/30/2023   INFLUENZA VACCINE  12/04/2023   FOOT EXAM  12/11/2023   HEMOGLOBIN A1C  03/10/2024   DEXA SCAN  Completed      12/18/2021    7:45 PM 12/19/2021    7:00 AM 12/19/2021   12:00 PM 01/24/2022   11:20 AM 12/11/2022    1:31 PM  Fall Risk  Falls in the past year?     0  (RETIRED) Patient Fall Risk Level High fall risk  High fall risk  High fall risk  High fall risk       Data saved with a previous flowsheet row definition   Functional Status Survey:    Vitals:   11/30/23 1038  BP: (!) 129/58  Resp: 18  Temp: 97.8 F (36.6 C)  SpO2: 92%  Weight: 157 lb (71.2 kg)   Body mass index is 32.81 kg/m. Physical Exam HEENT: Teeth with fractures, top teeth require extraction. SKIN: Rash on face, resembles rosacea. Labs reviewed: Recent Labs    03/28/23 0426 03/29/23 0414 03/30/23 0642 04/06/23 0000 07/06/23 0000 08/04/23 1011 09/03/23 1116  NA 138 138 137   < > 137 137 138  K 4.2 4.4 5.5*   < > 4.7 4.0 4.1  CL 101 103 103   < > 103 102  104  CO2 29 27 25    < > 28* 25 24  GLUCOSE 177* 173* 202*  --   --  191* 175*  BUN 11 10 8    < > 14 14 16   CREATININE 0.49 0.50 0.62   < > 0.5 0.71 0.62  CALCIUM 8.7* 9.1 9.3   < > 10.9* 11.0* 10.2  MG 1.5* 1.9 1.7  --   --   --   --   PHOS 2.5 1.9* 2.4*  --   --   --   --    < > = values in this interval not displayed.   Recent Labs    03/21/23 0502 03/22/23 0500 03/23/23 0507  AST 18 15 22    ALT 10 12 14   ALKPHOS 45 46 53  BILITOT 0.4 <0.2 0.4  PROT 5.7* 5.9* 6.3*  ALBUMIN 2.8* 2.7* 2.8*   Recent Labs    03/19/23 1849 03/21/23 0502 03/28/23 0426 03/29/23 0414 04/06/23 0000 08/04/23 1011  WBC 13.1*   < > 11.0* 10.1 10.3 10.5  NEUTROABS 9.8*  --   --   --  7,025.00 7.2  HGB 12.6   < > 10.6* 10.9* 11.3* 12.7  HCT 40.5   < > 34.1* 35.1* 36 41.4  MCV 91.4   < > 90.9 92.9  --  85.4  PLT 244   < > 278 312 264 241   < > = values in this interval not displayed.   Lab Results  Component Value Date   TSH 3.77 04/16/2023   Lab Results  Component Value Date   HGBA1C 7.6 09/08/2023   Lab Results  Component Value Date   CHOL 110 12/15/2021   HDL 19 (L) 12/15/2021   LDLCALC 46 12/15/2021   TRIG 223 (H) 12/15/2021   CHOLHDL 5.8 12/15/2021    Significant Diagnostic Results in last 30 days:  No results found.  Assessment/Plan Primary hyperparathyroidism Primary hyperparathyroidism likely due to toxic nodules. Serum calcium is well-managed at 2.2. Endocrinologist monitoring condition.  Hypothyroidism Previously on methimazole , which was discontinued due to hypothyroidism. TSH was 0.38 a month ago, indicating low levels. Endocrinologist managing condition.  Osteoporosis Osteoporosis managed with Prolia  and long-term treatment with Femara . Monitoring for effects on hypercalcemia.  Diabetes mellitus type 2 A1c was 7.6% in May 2025, above the target of less than 7% but below 8%.  Rosacea Facial rash resembling rosacea, possibly exacerbated by a new moisturizer. Rash appeared yesterday. No recent sun exposure reported. - Prescribe topical Flagyl  gel for facial rash - Advise discontinuation of new moisturizer if contributing to rash  Skin cancer Skin cancer on the nose. Avoids sun exposure.  Dental issues Fractured top teeth, possibly due to calcium deposits or fragility. Scheduled for removal of top teeth. - Prescribe Ativan  1 mg 30 minutes to 1 hour before  dental procedure for anxiety management  Hx of leg amputation Wheelchair dependent.  Family/ staff Communication: nursing  Labs/tests ordered:  none

## 2023-12-01 DIAGNOSIS — M6281 Muscle weakness (generalized): Secondary | ICD-10-CM | POA: Diagnosis not present

## 2023-12-04 DIAGNOSIS — M6281 Muscle weakness (generalized): Secondary | ICD-10-CM | POA: Diagnosis not present

## 2023-12-08 DIAGNOSIS — M6281 Muscle weakness (generalized): Secondary | ICD-10-CM | POA: Diagnosis not present

## 2023-12-16 DIAGNOSIS — M6281 Muscle weakness (generalized): Secondary | ICD-10-CM | POA: Diagnosis not present

## 2023-12-17 DIAGNOSIS — M6281 Muscle weakness (generalized): Secondary | ICD-10-CM | POA: Diagnosis not present

## 2023-12-23 ENCOUNTER — Encounter: Payer: Self-pay | Admitting: Oncology

## 2023-12-24 ENCOUNTER — Non-Acute Institutional Stay (SKILLED_NURSING_FACILITY): Payer: Self-pay | Admitting: Nurse Practitioner

## 2023-12-24 ENCOUNTER — Encounter: Payer: Self-pay | Admitting: Nurse Practitioner

## 2023-12-24 DIAGNOSIS — Z0000 Encounter for general adult medical examination without abnormal findings: Secondary | ICD-10-CM

## 2023-12-24 NOTE — Patient Instructions (Signed)
  Ms. Julie Acosta, Julie Acosta are on the podiatry list for routine foot care/monitoring  I have written an order for shingrix vaccine to be given in facility.   Health Maintenance  Topic Date Due   Eye exam for diabetics  10/30/2023   Flu Shot  12/04/2023   Complete foot exam   12/11/2023   Zoster (Shingles) Vaccine (1 of 2) 11/02/2026*   Hemoglobin A1C  03/10/2024   Medicare Annual Wellness Visit  12/23/2024   DTaP/Tdap/Td vaccine (2 - Td or Tdap) 12/30/2032   Pneumococcal Vaccine for age over 29  Completed   DEXA scan (bone density measurement)  Completed   HPV Vaccine  Aged Out   Meningitis B Vaccine  Aged Out   COVID-19 Vaccine  Discontinued  *Topic was postponed. The date shown is not the original due date.

## 2023-12-24 NOTE — Progress Notes (Signed)
 Subjective:   Julie Acosta is a 89 y.o. female who presents for Medicare Annual (Subsequent) preventive examination.  Visit Complete: In person [facility]   Cardiac Risk Factors include: advanced age (>32men, >79 women);sedentary lifestyle;hypertension;diabetes mellitus     Objective:    Today's Vitals   12/24/23 0944  BP: 130/62  Pulse: 94  Resp: 18  Temp: 97.8 F (36.6 C)  SpO2: 90%  Weight: 152 lb 6.4 oz (69.1 kg)  Height: 4' 10 (1.473 m)   Body mass index is 31.85 kg/m.     10/27/2023   12:23 PM 08/04/2023   10:24 AM 05/26/2023   10:22 AM 04/22/2023    9:53 AM 04/01/2023   10:02 AM 03/31/2023    1:15 PM 03/20/2023    9:00 AM  Advanced Directives  Does Patient Have a Medical Advance Directive? Yes Yes Yes Yes Yes Yes Yes  Type of Estate agent of [place];Out of facility DNR (pink MOST or yellow form) Living will;Healthcare Power of Attorney Out of facility DNR (pink MOST or yellow form) Out of facility DNR (pink MOST or yellow form) Out of facility DNR (pink MOST or yellow form) Out of facility DNR (pink MOST or yellow form) Out of facility DNR (pink MOST or yellow form)  Does patient want to make changes to medical advance directive? No - Patient declined Yes (ED - Information included in AVS) No - Patient declined No - Patient declined No - Patient declined No - Patient declined No - Patient declined  Copy of Healthcare Power of Attorney in Chart? Yes - validated most recent copy scanned in chart (See row information)        Pre-existing out of facility DNR order (yellow form or pink MOST form)     Yellow form placed in chart (order not valid for inpatient use) Yellow form placed in chart (order not valid for inpatient use) Pink MOST/Yellow Form most recent copy in chart - Physician notified to receive inpatient order    Current Medications (verified) Outpatient Encounter Medications as of 12/24/2023  Medication Sig   acetaminophen  (TYLENOL ) 650  MG CR tablet Take 650 mg by mouth every 8 (eight) hours as needed for pain.   Cholecalciferol  (VITAMIN D -1000 MAX ST) 25 MCG (1000 UT) tablet Take 1,000 Units by mouth daily.   dextromethorphan-guaiFENesin  (ROBITUSSIN-DM) 10-100 MG/5ML liquid Take 10 mLs by mouth every 4 (four) hours as needed for cough.   diclofenac  Sodium (VOLTAREN ) 1 % GEL Apply 4 g topically. Apply to stump topically every 6 hours as needed. Apply to right knee topically three times daily for pain.   fluticasone  (FLONASE ) 50 MCG/ACT nasal spray Place 2 sprays into both nostrils 2 (two) times daily.   gabapentin  (NEURONTIN ) 100 MG capsule Take 100 mg by mouth 3 (three) times daily.   GLIPIZIDE  XL 2.5 MG 24 hr tablet Take 2.5 mg by mouth daily.    hydrocortisone 2.5 % cream Apply to Hemorrhoids/buttucks topically every 8 hours as needed for hemorrhoids.   ipratropium-albuterol  (DUONEB) 0.5-2.5 (3) MG/3ML SOLN Take 3 mLs by nebulization every 4 (four) hours as needed.   loratadine  (CLARITIN ) 10 MG tablet Take 10 mg by mouth daily.   melatonin 3 MG TABS tablet Take 3 mg by mouth at bedtime.   metFORMIN  (GLUCOPHAGE ) 500 MG tablet Take 750 mg by mouth 2 (two) times daily with a meal.   metoprolol  tartrate (LOPRESSOR ) 25 MG tablet Take 25 mg by mouth 2 (two) times daily.  metroNIDAZOLE  (METROGEL ) 1 % gel Apply topically daily.   ondansetron  (ZOFRAN ) 4 MG tablet Take 4 mg by mouth every 4 (four) hours as needed for nausea or vomiting.   OXYGEN 2lpm as needed for sats less than 88% every 1 hour as needed   pantoprazole  (PROTONIX ) 40 MG tablet Take 40 mg by mouth daily.   PARoxetine  (PAXIL ) 30 MG tablet Take 30 mg by mouth daily.   Polyethyl Glycol-Propyl Glycol 0.4-0.3 % SOLN Apply to eye 2 (two) times daily as needed.   polyethylene glycol (MIRALAX  / GLYCOLAX ) 17 g packet Take 17 g by mouth daily as needed.   PRADAXA  150 MG CAPS capsule TAKE 1 CAPSULE BY MOUTH TWICE DAILY START TREATMENT 10 HRS LAST DOSE OF LOVENOX    SUNSCREEN  SPF50 EX Apply topically. Apply to exposed skin topically every 2 hours as needed for Sunburn Prevention   tiZANidine  (ZANAFLEX ) 2 MG tablet Take 2 mg by mouth every 12 (twelve) hours as needed for muscle spasms.   LORazepam  (ATIVAN ) 1 MG tablet Take by mouth. (Patient not taking: Reported on 12/24/2023)   mupirocin ointment (BACTROBAN) 2 % Apply 1 Application topically 3 (three) times daily. (Patient not taking: Reported on 12/24/2023)   No facility-administered encounter medications on file as of 12/24/2023.    Allergies (verified) Ciprofloxacin , Codeine, Tape, Ciprocinonide [fluocinolone], Amoxicillin, Cefuroxime , Latex, and Lipitor [atorvastatin]   History: Past Medical History:  Diagnosis Date   Anxiety    Benign breast lumps    Blood clotting disorder (HCC)    Bone cancer (HCC)    head of femur, left leg ... 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  Oncotype recurrence score: 1.  DCIS, margin less than 0.5 mm.  Negative sentinel node.  Accelerated partial breast radiation.   Cervicalgia    Chronic kidney disease    kidney stones   Colon cancer [facility])    patient unaware of this   Complication of anesthesia    mood alteration / not sure if d/t pain medicine or anesthesia as she passed out    Cough    NASAL DRIP / SNEEZING / SORE THROAT MOSTLY CONSTANT   Depression    Diabetes (HCC)    Diverticulitis    Dizzy    DVT (deep venous thrombosis) (HCC) 07/27/2017   GERD (gastroesophageal reflux disease)    H/O blood clots    arm and leg   HBP (high blood pressure)    Hematuria    gross   Hemorrhoids    HLD (hyperlipidemia)    Microscopic hematuria 06/02/2015   Muscle pain    Osteoarthritis    Osteoporosis    Panic disorder    Personal history of radiation therapy    PND (post-nasal drip)    CHRONIC WITH SORE THROAT AND SNEEZING   Reflux     Sepsis (HCC)    Sepsis due to pneumonia (HCC) 03/20/2023   Sepsis, unspecified organism (HCC) 12/15/2021   Formatting of this note might be different from the original.  Last Assessment & Plan: Formatting of this note might be different from the original. Appears to be multifactorial and secondary to healthcare associated pneumonia as well as a UTI CT angio shows asymmetric airspace disease at the left base and posteriorly in the right upper lobe concerning for pneumonia Patient also has pyuria Patient   Severe sepsis (HCC) 12/15/2021  Skin cancer    nose   Swelling    Tremor    Tremors of nervous system    Ureteral stone with hydronephrosis 11/26/2018   Past Surgical History:  Procedure Laterality Date   APPENDECTOMY  1962   BREAST BIOPSY Right 2006?   benign   BREAST BIOPSY Left 2019   invasive mammary carcinoma   BREAST CYST EXCISION Left 07/27/2017   Procedure: SKIN CYST EXCISED;  Surgeon: Dessa Reyes ORN, MD;  Location: [facility] ORS;  Service: General;  Laterality: Left;   BREAST LUMPECTOMY Left 07/2017   invasive mammary, DCIS  mammosite   BREAST SURGERY Right 2019   CARPAL TUNNEL RELEASE Right    CATARACT EXTRACTION W/PHACO Left 11/11/2016   Procedure: CATARACT EXTRACTION PHACO AND INTRAOCULAR LENS PLACEMENT (IOC);  Surgeon: Jaye Fallow, MD;  Location: [facility] ORS;  Service: Ophthalmology;  Laterality: Left;  US  01:07.9 AP% 19.2 CDE 13.02 Fluid pack lot # [id number]   CATARACT EXTRACTION W/PHACO Right 12/09/2016   Procedure: CATARACT EXTRACTION PHACO AND INTRAOCULAR LENS PLACEMENT (IOC);  Surgeon: Jaye Fallow, MD;  Location: [facility] ORS;  Service: Ophthalmology;  Laterality: Right;  US  00:49 AP% 21.2 CDE 10.39 Fluid pack lot # [id number]   CHOLECYSTECTOMY     COLONOSCOPY WITH PROPOFOL  N/A 11/22/2014   Procedure: COLONOSCOPY WITH PROPOFOL ;  Surgeon: Lamar ONEIDA Holmes, MD;  Location[facility]COPY;  Service: Endoscopy;  Laterality: N/A;   CYSTOSCOPY WITH STENT PLACEMENT  Bilateral 11/27/2018   Procedure: CYSTOSCOPY WITH STENT PLACEMENT;  Surgeon: Nieves Cough, MD;  Location: [facility] ORS;  Service: Urology;  Laterality: Bilateral;   CYSTOSCOPY/URETEROSCOPY/HOLMIUM LASER/STENT PLACEMENT Bilateral 12/17/2018   Procedure: CYSTOSCOPY/URETEROSCOPY/HOLMIUM LASER/STENT Exchange;  Surgeon: Francisca Redell BROCKS, MD;  Location: [facility] ORS;  Service: Urology;  Laterality: Bilateral;   ESOPHAGOGASTRODUODENOSCOPY  11/22/2014   Procedure: ESOPHAGOGASTRODUODENOSCOPY (EGD);  Surgeon: Lamar ONEIDA Holmes, MD;  Location: [facility] ENDOSCOPY;  Service: Endoscopy;;   ESOPHAGOGASTRODUODENOSCOPY (EGD) WITH PROPOFOL  N/A 03/05/2017   Procedure: ESOPHAGOGASTRODUODENOSCOPY (EGD) WITH PROPOFOL ;  Surgeon: Unk Corinn Skiff, MD;  Location: [facility] ENDOSCOPY;  Service: Gastroenterology;  Laterality: N/A;   EXTRACORPOREAL SHOCK WAVE LITHOTRIPSY Right 03/04/2018   Procedure: EXTRACORPOREAL SHOCK WAVE LITHOTRIPSY (ESWL);  Surgeon: Francisca Redell BROCKS, MD;  Location: [facility] ORS;  Service: Urology;  Laterality: Right;   EYE SURGERY Bilateral 2012   cataract extraction with iol   GALLBLADDER SURGERY  1968   hemi pelvectomy     left side at age 17   HEMIPELVIC Left 1962   LEG SURGERY Left    AMPUTATION d/t cancer at 89 years old   MASTECTOMY, PARTIAL Left 07/27/2017   Procedure: MASTECTOMY PARTIAL;  Surgeon: Dessa Reyes ORN, MD;  Location: [facility] ORS;  Service: General;  Laterality: Left;   MOUTH SURGERY  2019   teeth pulled with a bridge insertion.   fell out 12/16/18   OVARY SURGERY Right    cyst removed   SAVORY DILATION  11/22/2014   Procedure: SAVORY DILATION;  Surgeon: Lamar ONEIDA Holmes, MD;  Location: [facility] ENDOSCOPY;  Service: Endoscopy;;   SENTINEL NODE BIOPSY Left 07/27/2017   Procedure: SENTINEL NODE BIOPSY;  Surgeon: Dessa Reyes ORN, MD;  Location: [facility] ORS;  Service: General;  Laterality: Left;   SKIN CANCER EXCISION     nose and arm and face   TEE WITHOUT CARDIOVERSION N/A 07/25/2019   Procedure:  TRANSESOPHAGEAL ECHOCARDIOGRAM (TEE);  Surgeon: Hester Wolm PARAS, MD;  Location: [facility] ORS;  Service: Cardiovascular;  Laterality: N/A;   TONSILLECTOMY     Family History  Problem Relation Age of Onset   Breast cancer Paternal Aunt    Ovarian cancer Sister    Prostate cancer Father    Stroke Mother    Kidney cancer Neg Hx    Bladder Cancer Neg Hx    Social History   Socioeconomic History   Marital status: Divorced    Spouse name: Not on file   Number of children: Not on file   Years of education: Not on file   Highest education level: Not on file  Occupational History   Occupation: Runner, broadcasting/film/video    Comment: retired  Tobacco Use   Smoking status: Never   Smokeless tobacco: Never  Vaping Use   Vaping status: Never Used  Substance and Sexual Activity   Alcohol  use: No   Drug use: No   Sexual activity: Not Currently  Other Topics Concern   Not on file  Social History Narrative   [facility] lives [facility]   Social Drivers of Health   Financial Resource Strain: Low Risk  (02/16/2023)   Received from [facility]   Overall Financial Resource Strain (CARDIA)    Difficulty of Paying Living Expenses: Not hard at all  Food Insecurity: No Food Insecurity (03/20/2023)   Hunger Vital Sign    Worried About Running Out of Food in the Last Year: Never true    Ran Out of Food in the Last Year: Never true  Transportation Needs: No Transportation Needs (03/20/2023)   PRAPARE - Administrator, Civil Service (Medical): No    Lack of Transportation (Non-Medical): No  Physical Activity: Not on file  Stress: Not on file  Social Connections: Not on file    Tobacco Counseling Counseling given: Not Answered   Clinical Intake:  Pre-visit preparation completed: Yes  Pain : No/denies pain     BMI - recorded: 31.85 Nutritional Status: BMI > 30  Obese Nutritional Risks: Other (Comment)  How often do you need to have someone help  you when you read instructions, pamphlets, or other written materials from your doctor or pharmacy?: 1 - Never         Activities of Daily Living    12/24/2023   11:56 AM 03/20/2023    9:00 AM  In your present state of health, do you have any difficulty performing the following activities:  Hearing?  0  Vision?  0  Difficulty concentrating or making decisions?  1  Walking or climbing stairs? 1   Dressing or bathing? 1   Doing errands, shopping? 1 0  Preparing Food and eating ? Y   Comment does not prepare food   Using the Toilet? Y   In the past six months, have you accidently leaked urine? N   Do you have problems with loss of bowel control? N   Managing your Medications? Y   Managing your Finances? Y   Housekeeping or managing your Housekeeping? Y     Patient Care Team: Abdul Fine, MD as PCP - General (Family Medicine) Dessa, Reyes ORN, MD (General Surgery) Jacobo Evalene PARAS, MD as Consulting Physician (Oncology) [facility], [facility])  Indicate any recent Medical Services you may have received from other than [facility] providers in the past year (date may be approximate).     Assessment:   This is a routine wellness examination for Julie Acosta.  Hearing/Vision screen No results found.   Goals Addressed   None    Depression Screen  12/24/2023   11:59 AM 10/15/2017    1:35 PM  PHQ 2/9 Scores  PHQ - 2 Score 0 0    Fall Risk    12/24/2023   11:58 AM 12/11/2022    1:31 PM 06/30/2019   10:31 AM 06/03/2018   10:27 AM 06/03/2018   10:26 AM  Fall Risk   Falls in the past year? 0 0 0  0  0   Number falls in past yr: 0  0    Injury with Fall?   0    Risk for fall due to : Impaired balance/gait;Impaired mobility   Impaired balance/gait    Follow up    Falls evaluation completed;Falls prevention discussed       Data saved with a previous flowsheet row definition    MEDICARE RISK AT HOME: Medicare Risk at Home Any stairs in or around the home?:  No Home free of loose throw rugs in walkways, pet beds, electrical cords, etc?: Yes Adequate lighting in your home to reduce risk of falls?: Yes Life alert?: No Use of a cane, walker or w/c?: Yes Grab bars in the bathroom?: Yes Shower chair or bench in shower?: Yes Elevated toilet seat or a handicapped toilet?: Yes  TIMED UP AND GO:  Was the test performed?  No    Cognitive Function:        Immunizations Immunization History  Administered Date(s) Administered   Influenza, High Dose Seasonal PF 03/07/2023   Influenza-Unspecified 02/03/2020, 02/18/2021, 02/18/2022   Moderna Covid-19 Fall Seasonal Vaccine 85yrs & older 08/12/2022   PNEUMOCOCCAL CONJUGATE-20 01/17/2023   Pneumococcal Conjugate-13 09/28/2017   Tdap 12/31/2022   Unspecified SARS-COV-2 Vaccination 05/20/2019, 05/20/2019, 06/17/2019, 03/16/2020, 09/20/2020, 01/25/2021, 01/30/2023, 03/07/2023    TDAP status: Up to date  Flu Vaccine status: Due, Education has been provided regarding the importance of this vaccine. Advised may receive this vaccine at local pharmacy or Health Dept. Aware to provide a copy of the vaccination record if obtained from local pharmacy or Health Dept. Verbalized acceptance and understanding.  Pneumococcal vaccine status: Up to date  Covid-19 vaccine status: Information provided on how to obtain vaccines.   Qualifies for Shingles Vaccine? Yes   Zostavax completed No   Shingrix Completed?: No.    Education has been provided regarding the importance of this vaccine. Patient has been advised to call insurance company to determine out of pocket expense if they have not yet received this vaccine. Advised may also receive vaccine at local pharmacy or Health Dept. Verbalized acceptance and understanding.  Screening Tests Health Maintenance  Topic Date Due   OPHTHALMOLOGY EXAM  10/30/2023   INFLUENZA VACCINE  12/04/2023   FOOT EXAM  12/11/2023   Zoster Vaccines- Shingrix (1 of 2) 11/02/2026  (Originally 04/30/1953)   HEMOGLOBIN A1C  03/10/2024   Medicare Annual Wellness (AWV)  12/23/2024   DTaP/Tdap/Td (2 - Td or Tdap) 12/30/2032   Pneumococcal Vaccine: 50+ Years  Completed   DEXA SCAN  Completed   HPV VACCINES  Aged Out   Meningococcal B Vaccine  Aged Out   COVID-19 Vaccine  Discontinued    Health Maintenance  Health Maintenance Due  Topic Date Due   OPHTHALMOLOGY EXAM  10/30/2023   INFLUENZA VACCINE  12/04/2023   FOOT EXAM  12/11/2023    Colorectal cancer screening: No longer required.   Mammogram status: No longer required due to age.  Lung Cancer Screening: (Low Dose CT Chest recommended if Age 88-80 years, 20 pack-year currently smoking OR  have quit w/in 15years.) does not qualify.   Lung Cancer Screening Referral: na  Additional Screening:  Hepatitis C Screening: does not qualify; Completed   Vision Screening: Recommended annual ophthalmology exams for early detection of glaucoma and other disorders of the eye. Is the patient up to date with their annual eye exam?  Yes  Who is the provider or what is the name of the office in which the patient attends annual eye exams? [facility] If pt is not established with a provider, would they like to be referred to a provider to establish care? No .   Dental Screening: Recommended annual dental exams for proper oral hygiene  Diabetic Foot Exam: overdue  Community Resource Referral / Chronic Care Management: CRR required this visit?  No   CCM required this visit?  No     Plan:     I have personally reviewed and noted the following in the patient's chart:   Medical and social history Use of alcohol , tobacco or illicit drugs  Current medications and supplements including opioid prescriptions. Patient is not currently taking opioid prescriptions. Functional ability and status Nutritional status Physical activity Advanced directives List of other physicians Hospitalizations, surgeries, and ER visits  in previous 12 months Vitals Screenings to include cognitive, depression, and falls Referrals and appointments  In addition, I have reviewed and discussed with patient certain preventive protocols, quality metrics, and best practice recommendations. A written personalized care plan for preventive services as well as general preventive health recommendations were provided to patient.     Harlene MARLA An, NP   12/24/2023

## 2023-12-25 ENCOUNTER — Ambulatory Visit: Payer: Self-pay | Admitting: Student

## 2024-01-05 ENCOUNTER — Telehealth: Payer: Self-pay | Admitting: Pharmacist

## 2024-01-05 NOTE — Progress Notes (Signed)
   01/05/2024  Patient ID: Julie Acosta, female   DOB: 03-25-1934, 89 y.o.   MRN: [id number]  Pharmacy Quality Measure Review  This patient is appearing on a report for being at risk of failing the adherence measure for diabetes medications this calendar year.   Medication: Metformin  500 mg Last fill date: 10/27/23 for 30 day supply  Contacted pharmacy to facilitate refills. Called [facility].  They said the patient had Metformin  1000 mg filled 12/30/23 in cycle packs.  Metformin  500 mg is on the report and medication list.  Medication list corrected to reflect the new strength.  Patient lives in a nursing home and gets her medications filled in 7 day cycle packs.   Cassius DOROTHA Brought, PharmD, BCACP Clinical Pharmacist [phone]

## 2024-01-12 ENCOUNTER — Encounter: Payer: Self-pay | Admitting: Nurse Practitioner

## 2024-01-12 ENCOUNTER — Non-Acute Institutional Stay (SKILLED_NURSING_FACILITY): Admitting: Nurse Practitioner

## 2024-01-12 DIAGNOSIS — R053 Chronic cough: Secondary | ICD-10-CM

## 2024-01-12 DIAGNOSIS — E0590 Thyrotoxicosis, unspecified without thyrotoxic crisis or storm: Secondary | ICD-10-CM

## 2024-01-12 DIAGNOSIS — E785 Hyperlipidemia, unspecified: Secondary | ICD-10-CM | POA: Diagnosis not present

## 2024-01-12 DIAGNOSIS — I872 Venous insufficiency (chronic) (peripheral): Secondary | ICD-10-CM | POA: Diagnosis not present

## 2024-01-12 DIAGNOSIS — I480 Paroxysmal atrial fibrillation: Secondary | ICD-10-CM | POA: Diagnosis not present

## 2024-01-12 DIAGNOSIS — F3341 Major depressive disorder, recurrent, in partial remission: Secondary | ICD-10-CM | POA: Diagnosis not present

## 2024-01-12 DIAGNOSIS — K219 Gastro-esophageal reflux disease without esophagitis: Secondary | ICD-10-CM

## 2024-01-12 DIAGNOSIS — E1142 Type 2 diabetes mellitus with diabetic polyneuropathy: Secondary | ICD-10-CM | POA: Diagnosis not present

## 2024-01-12 NOTE — Assessment & Plan Note (Signed)
Stable on paxil, continue current regimen

## 2024-01-12 NOTE — Progress Notes (Signed)
 Location:  Other Nursing Home Room Number: [facility] [id number] Place of Service:  SNF (31)  Abdul Fine, MD  Patient Care Team: Abdul Fine, MD as PCP - General (Family Medicine) Dessa, Reyes ORN, MD (General Surgery) Jacobo Evalene PARAS, MD as Consulting Physician (Oncology) Pa, [facility])  Extended Emergency Contact Information Primary Emergency Contact: Kelsie Maryruth FALCON Address: [address]          [place], [place] [address] [place] Home Phone: [phone] Mobile Phone: [phone] Relation: Daughter  Goals of care: Advanced Directive information    01/12/2024   12:06 PM  Advanced Directives  Does Patient Have a Medical Advance Directive? Yes  Type of Advance Directive Out of facility DNR (pink MOST or yellow form)  Does patient want to make changes to medical advance directive? No - Patient declined     Chief Complaint  Patient presents with   Medical Management of Chronic Issues    Medical Management of Chronic Issues.     HPI: The patient is an 89 year old female presenting for chronic disease management.   She has experienced a persistent scratchy cough for 1-2 years. No changes in this. No congestion noted  Several years ago, she injured her knee and currently applies Voltaren  gel to the area twice daily.   She is mobile using an Passenger transport manager.   She reports sleeping well and maintaining a good appetite.   Daily fluid intake consists of about 16 oz of water, unsweetened tea, and coffee.   There are no medication concerns; bowel and bladder functions are normal. She requires a lift for bed and toilet transfers.   She complains of generalized aches and is less active than before, now exercising once or twice per week.  Her daughter lives nearby.  She visited the dentist last Friday; plans are in place to extract her upper teeth and attempt to repair the lower ones. The patient is unsure about the specifics  of these procedures. She previously saw a Company secretary.   She also mentions memory lapses that occur sporadically.  Capillary blood glucose remains elevated, fasting at 170-190 mg/dL.   Past Medical History:  Diagnosis Date   Anxiety    Benign breast lumps    Blood clotting disorder (HCC)    Bone cancer (HCC)    head of femur, left leg ... 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  Oncotype recurrence score: 1.  DCIS, margin less than 0.5 mm.  Negative sentinel node.  Accelerated partial breast radiation.   Cervicalgia    Chronic kidney disease    kidney stones   Colon cancer [facility])    patient unaware of this   Complication of anesthesia    mood alteration / not sure if d/t pain medicine or anesthesia as she passed out    Cough    NASAL DRIP / SNEEZING / SORE THROAT MOSTLY CONSTANT   Depression    Diabetes (HCC)    Diverticulitis    Dizzy    DVT (deep venous thrombosis) (HCC) 07/27/2017   GERD (gastroesophageal reflux disease)    H/O blood clots    arm and leg   HBP (high blood pressure)    Hematuria    gross   Hemorrhoids    HLD (hyperlipidemia)    Microscopic hematuria 06/02/2015   Muscle pain  Osteoarthritis    Osteoporosis    Panic disorder    Personal history of radiation therapy    PND (post-nasal drip)    CHRONIC WITH SORE THROAT AND SNEEZING   Reflux    Sepsis (HCC)    Sepsis due to pneumonia (HCC) 03/20/2023   Sepsis, unspecified organism (HCC) 12/15/2021   Formatting of this note might be different from the original.  Last Assessment & Plan: Formatting of this note might be different from the original. Appears to be multifactorial and secondary to healthcare associated pneumonia as well as a UTI CT angio shows asymmetric airspace disease at the left base and posteriorly in the right upper lobe concerning for pneumonia Patient  also has pyuria Patient   Severe sepsis (HCC) 12/15/2021   Skin cancer    nose   Swelling    Tremor    Tremors of nervous system    Ureteral stone with hydronephrosis 11/26/2018   Past Surgical History:  Procedure Laterality Date   APPENDECTOMY  1962   BREAST BIOPSY Right 2006?   benign   BREAST BIOPSY Left 2019   invasive mammary carcinoma   BREAST CYST EXCISION Left 07/27/2017   Procedure: SKIN CYST EXCISED;  Surgeon: Dessa Reyes ORN, MD;  Location: [facility] ORS;  Service: General;  Laterality: Left;   BREAST LUMPECTOMY Left 07/2017   invasive mammary, DCIS  mammosite   BREAST SURGERY Right 2019   CARPAL TUNNEL RELEASE Right    CATARACT EXTRACTION W/PHACO Left 11/11/2016   Procedure: CATARACT EXTRACTION PHACO AND INTRAOCULAR LENS PLACEMENT (IOC);  Surgeon: Jaye Fallow, MD;  Location: [facility] ORS;  Service: Ophthalmology;  Laterality: Left;  US  01:07.9 AP% 19.2 CDE 13.02 Fluid pack lot # [id number]   CATARACT EXTRACTION W/PHACO Right 12/09/2016   Procedure: CATARACT EXTRACTION PHACO AND INTRAOCULAR LENS PLACEMENT (IOC);  Surgeon: Jaye Fallow, MD;  Location: [facility] ORS;  Service: Ophthalmology;  Laterality: Right;  US  00:49 AP% 21.2 CDE 10.39 Fluid pack lot # [id number]   CHOLECYSTECTOMY     COLONOSCOPY WITH PROPOFOL  N/A 11/22/2014   Procedure: COLONOSCOPY WITH PROPOFOL ;  Surgeon: Lamar ONEIDA Holmes, MD;  Location: [facility] ENDOSCOPY;  Service: Endoscopy;  Laterality: N/A;   CYSTOSCOPY WITH STENT PLACEMENT Bilateral 11/27/2018   Procedure: CYSTOSCOPY WITH STENT PLACEMENT;  Surgeon: Nieves Cough, MD;  Location: [facility] ORS;  Service: Urology;  Laterality: Bilateral;   CYSTOSCOPY/URETEROSCOPY/HOLMIUM LASER/STENT PLACEMENT Bilateral 12/17/2018   Procedure: CYSTOSCOPY/URETEROSCOPY/HOLMIUM LASER/STENT Exchange;  Surgeon: Francisca Redell BROCKS, MD;  Location: [facility] ORS;  Service: Urology;  Laterality: Bilateral;   ESOPHAGOGASTRODUODENOSCOPY  11/22/2014   Procedure: ESOPHAGOGASTRODUODENOSCOPY (EGD);   Surgeon: Lamar ONEIDA Holmes, MD;  Location[facility] ENDOSCOPY;  Service: Endoscopy;;   ESOPHAGOGASTRODUODENOSCOPY (EGD) WITH PROPOFOL  N/A 03/05/2017   Procedure: ESOPHAGOGASTRODUODENOSCOPY (EGD) WITH PROPOFOL ;  Surgeon: Unk Corinn Skiff, MD;  Location: [facility] ENDOSCOPY;  Service: Gastroenterology;  Laterality: N/A;   EXTRACORPOREAL SHOCK WAVE LITHOTRIPSY Right 03/04/2018   Procedure: EXTRACORPOREAL SHOCK WAVE LITHOTRIPSY (ESWL);  Surgeon: Francisca Redell BROCKS, MD;  Location: [facility] ORS;  Service: Urology;  Laterality: Right;   EYE SURGERY Bilateral 2012   cataract extraction with iol   GALLBLADDER SURGERY  1968   hemi pelvectomy     left side at age 77   HEMIPELVIC Left 1962   LEG SURGERY Left    AMPUTATION d/t cancer at 89 years old   MASTECTOMY, PARTIAL Left 07/27/2017   Procedure: MASTECTOMY PARTIAL;  Surgeon: Dessa Reyes ORN, MD;  Location: [facility] ORS;  Service: General;  Laterality: Left;  MOUTH SURGERY  2019   teeth pulled with a bridge insertion.   fell out 12/16/18   OVARY SURGERY Right    cyst removed   SAVORY DILATION  11/22/2014   Procedure: SAVORY DILATION;  Surgeon: Lamar ONEIDA Holmes, MD;  Location: [facility] ENDOSCOPY;  Service: Endoscopy;;   SENTINEL NODE BIOPSY Left 07/27/2017   Procedure: SENTINEL NODE BIOPSY;  Surgeon: Dessa Reyes ORN, MD;  Location: [facility] ORS;  Service: General;  Laterality: Left;   SKIN CANCER EXCISION     nose and arm and face   TEE WITHOUT CARDIOVERSION N/A 07/25/2019   Procedure: TRANSESOPHAGEAL ECHOCARDIOGRAM (TEE);  Surgeon: Hester Wolm PARAS, MD;  Location: [facility] ORS;  Service: Cardiovascular;  Laterality: N/A;   TONSILLECTOMY      Allergies  Allergen Reactions   Ciprofloxacin  Hives   Codeine Other (See Comments)    HALLUCINATIONS   Tape     Rash and skin irritation/ paper tape and tegaderm OK   Ciprocinonide [Fluocinolone] Other (See Comments)    unknown   Amoxicillin Other (See Comments)    Upset stomach Has patient had a PCN reaction causing  immediate rash, facial/tongue/throat swelling, SOB or lightheadedness with hypotension: No Has patient had a PCN reaction causing severe rash involving mucus membranes or skin necrosis: No Has patient had a PCN reaction that required hospitalization: No Has patient had a PCN reaction occurring within the last 10 years: Yes If all of the above answers are NO, then may proceed with Cephalosporin use.;   Cefuroxime  Other (See Comments)    OK INTRACAMERALLY PER DR WLP, upset stomach   Latex Rash    Rast test NEGATIVE   Lipitor [Atorvastatin] Other (See Comments)    unknown    Outpatient Encounter Medications as of 01/12/2024  Medication Sig   acetaminophen  (TYLENOL ) 650 MG CR tablet Take 650 mg by mouth every 8 (eight) hours as needed for pain.   Cholecalciferol  (VITAMIN D -1000 MAX ST) 25 MCG (1000 UT) tablet Take 1,000 Units by mouth daily.   dextromethorphan-guaiFENesin  (ROBITUSSIN-DM) 10-100 MG/5ML liquid Take 10 mLs by mouth every 4 (four) hours as needed for cough.   diclofenac  Sodium (VOLTAREN ) 1 % GEL Apply 4 g topically. Apply to stump topically every 6 hours as needed. Apply to right knee topically three times daily for pain.   fluticasone  (FLONASE ) 50 MCG/ACT nasal spray Place 2 sprays into both nostrils 2 (two) times daily.   gabapentin  (NEURONTIN ) 100 MG capsule Take 100 mg by mouth 3 (three) times daily.   GLIPIZIDE  XL 2.5 MG 24 hr tablet Take 2.5 mg by mouth daily.    hydrocortisone 2.5 % cream Apply to Hemorrhoids/buttucks topically every 8 hours as needed for hemorrhoids.   ipratropium-albuterol  (DUONEB) 0.5-2.5 (3) MG/3ML SOLN Take 3 mLs by nebulization every 4 (four) hours as needed.   loratadine  (CLARITIN ) 10 MG tablet Take 10 mg by mouth daily.   melatonin 3 MG TABS tablet Take 3 mg by mouth at bedtime.   metFORMIN  (GLUCOPHAGE ) 1000 MG tablet Take 1,000 mg by mouth 2 (two) times daily.   metoprolol  tartrate (LOPRESSOR ) 25 MG tablet Take 25 mg by mouth 2 (two) times daily.    metroNIDAZOLE  (METROGEL ) 1 % gel Apply topically daily.   ondansetron  (ZOFRAN ) 4 MG tablet Take 4 mg by mouth every 4 (four) hours as needed for nausea or vomiting.   OXYGEN 2lpm as needed for sats less than 88% every 1 hour as needed   pantoprazole  (PROTONIX ) 40 MG tablet Take  40 mg by mouth daily.   PARoxetine  (PAXIL ) 30 MG tablet Take 30 mg by mouth daily.   Polyethyl Glycol-Propyl Glycol 0.4-0.3 % SOLN Apply to eye 2 (two) times daily as needed.   polyethylene glycol (MIRALAX  / GLYCOLAX ) 17 g packet Take 17 g by mouth daily as needed.   PRADAXA  150 MG CAPS capsule TAKE 1 CAPSULE BY MOUTH TWICE DAILY START TREATMENT 10 HRS LAST DOSE OF LOVENOX    Sodium Fluoride (PREVIDENT 5000 BOOSTER PLUS) 1.1 % PSTE Place onto teeth at bedtime.   SUNSCREEN SPF50 EX Apply topically. Apply to exposed skin topically every 2 hours as needed for Sunburn Prevention   tiZANidine  (ZANAFLEX ) 2 MG tablet Take 2 mg by mouth every 12 (twelve) hours as needed for muscle spasms.   LORazepam  (ATIVAN ) 1 MG tablet Take by mouth. (Patient not taking: Reported on 01/12/2024)   mupirocin ointment (BACTROBAN) 2 % Apply 1 Application topically 3 (three) times daily. (Patient not taking: Reported on 01/12/2024)   No facility-administered encounter medications on file as of 01/12/2024.    Review of Systems  Constitutional: Negative.   HENT:  Positive for dental problem.   Eyes: Negative.   Respiratory:  Positive for cough.   Cardiovascular:  Positive for leg swelling.  Genitourinary: Negative.   Musculoskeletal:  Positive for arthralgias.  Skin: Negative.   Neurological: Negative.   Psychiatric/Behavioral: Negative.       Immunization History  Administered Date(s) Administered   INFLUENZA, HIGH DOSE SEASONAL PF 03/07/2023   Influenza-Unspecified 02/03/2020, 02/18/2021, 02/18/2022   Moderna Covid-19 Fall Seasonal Vaccine 23yrs & older 08/12/2022   PNEUMOCOCCAL CONJUGATE-20 01/17/2023   Pneumococcal Conjugate-13  09/28/2017   Tdap 12/31/2022   Unspecified SARS-COV-2 Vaccination 05/20/2019, 05/20/2019, 06/17/2019, 03/16/2020, 09/20/2020, 01/25/2021, 01/30/2023, 03/07/2023   Zoster Recombinant(Shingrix) 12/25/2023   Pertinent  Health Maintenance Due  Topic Date Due   Influenza Vaccine  12/04/2023   HEMOGLOBIN A1C  03/10/2024   OPHTHALMOLOGY EXAM  11/11/2024   FOOT EXAM  12/24/2024   DEXA SCAN  Completed      12/19/2021    7:00 AM 12/19/2021   12:00 PM 01/24/2022   11:20 AM 12/11/2022    1:31 PM 12/24/2023   11:58 AM  Fall Risk  Falls in the past year?    0 0  (RETIRED) Patient Fall Risk Level High fall risk  High fall risk  High fall risk     Patient at Risk for Falls Due to     Impaired balance/gait;Impaired mobility     Data saved with a previous flowsheet row definition   Functional Status Survey:    Vitals:   01/12/24 1159  BP: 128/76  Pulse: 74  Resp: 18  Temp: 98.8 F (37.1 C)  SpO2: 92%  Weight: 157 lb 3.2 oz (71.3 kg)  Height: 4' 10 (1.473 m)   Body mass index is 32.85 kg/m. Physical Exam Vitals reviewed.  HENT:     Head: Normocephalic and atraumatic.     Right Ear: External ear normal.     Left Ear: External ear normal.     Nose: Nose normal.     Mouth/Throat:     Mouth: Mucous membranes are moist.     Pharynx: Oropharynx is clear.  Eyes:     Conjunctiva/sclera: Conjunctivae normal.  Cardiovascular:     Rate and Rhythm: Normal rate and regular rhythm.     Pulses: Normal pulses.     Heart sounds: Normal heart sounds.  Pulmonary:  Effort: Pulmonary effort is normal.     Breath sounds: Normal breath sounds.  Abdominal:     General: Bowel sounds are normal.     Palpations: Abdomen is soft.  Musculoskeletal:     Right lower leg: 1+ Pitting Edema present.  Skin:    General: Skin is warm and dry.  Neurological:     Mental Status: She is alert and oriented to person, place, and time.  Psychiatric:        Mood and Affect: Mood normal.        Behavior:  Behavior normal.    Labs reviewed: Recent Labs    03/28/23 0426 03/29/23 0414 03/30/23 0642 04/06/23 0000 07/06/23 0000 08/04/23 1011 09/03/23 1116  NA 138 138 137   < > 137 137 138  K 4.2 4.4 5.5*   < > 4.7 4.0 4.1  CL 101 103 103   < > 103 102 104  CO2 29 27 25    < > 28* 25 24  GLUCOSE 177* 173* 202*  --   --  191* 175*  BUN 11 10 8    < > 14 14 16   CREATININE 0.49 0.50 0.62   < > 0.5 0.71 0.62  CALCIUM 8.7* 9.1 9.3   < > 10.9* 11.0* 10.2  MG 1.5* 1.9 1.7  --   --   --   --   PHOS 2.5 1.9* 2.4*  --   --   --   --    < > = values in this interval not displayed.   Recent Labs    03/21/23 0502 03/22/23 0500 03/23/23 0507  AST 18 15 22   ALT 10 12 14   ALKPHOS 45 46 53  BILITOT 0.4 <0.2 0.4  PROT 5.7* 5.9* 6.3*  ALBUMIN 2.8* 2.7* 2.8*   Recent Labs    03/19/23 1849 03/21/23 0502 03/28/23 0426 03/29/23 0414 04/06/23 0000 08/04/23 1011  WBC 13.1*   < > 11.0* 10.1 10.3 10.5  NEUTROABS 9.8*  --   --   --  7,025.00 7.2  HGB 12.6   < > 10.6* 10.9* 11.3* 12.7  HCT 40.5   < > 34.1* 35.1* 36 41.4  MCV 91.4   < > 90.9 92.9  --  85.4  PLT 244   < > 278 312 264 241   < > = values in this interval not displayed.   Lab Results  Component Value Date   TSH 3.77 04/16/2023   Lab Results  Component Value Date   HGBA1C 7.6 09/08/2023   Lab Results  Component Value Date   CHOL 110 12/15/2021   HDL 19 (L) 12/15/2021   LDLCALC 46 12/15/2021   TRIG 223 (H) 12/15/2021   CHOLHDL 5.8 12/15/2021    Significant Diagnostic Results in last 30 days:  No results found.  Assessment/Plan  1. Type 2 diabetes mellitus with peripheral neuropathy (HCC) (Primary) - Suboptimal control. - Initiate Januvia 100 mg daily.  - Last HbA1c: 7.6% (May 2025). - Routine labs scheduled for November 2025. - Continue Glipizide  2.5 mg daily and Metformin  1000 mg twice daily. - Continue gabapentin  as prescribed for neuropathy.  2. Recurrent major depressive disorder, in partial remission  (HCC) - Stable, denies acute complaints. - Continue Paxil  30 mg daily as prescribed.  3. Hyperlipidemia, unspecified hyperlipidemia type - Lipid profile to be repeated with next routine lab panel.  4. Hyperthyroidism - Last TSH: 3.77 (December 2024). Continues to follow with endocrinology  5. Venous insufficiency of  right leg - Stable. - Continue daily compression stocking and elevate leg while seated.  6. Paroxysmal atrial fibrillation (HCC) - Chronic and stable. - Continue Pradaxa  as prescribed.  7. Chronic cough - Persistent and stable. - Not associated with meals per patient report. No congestion.  - Continue throat lozenges and cough syrup as needed.  8. Gastroesophageal reflux disease without esophagitis - Chronic and stable. - Continue Protonix  as prescribed.   Irfat Avie, AGPCNP Student - [facility] I personally was present during the history, physical exam and medical decision-making activities of this service and have verified that the service and findings are accurately documented in the student's note Jemari Hallum K. Caro BODILY [facility] [phone]

## 2024-01-12 NOTE — Progress Notes (Deleted)
 T  Location:      Place of Service:     Abdul Fine, MD  Patient Care Team: Abdul Fine, MD as PCP - General (Family Medicine) Julie Acosta, Julie Acosta ORN, MD (General Surgery) Jacobo Evalene PARAS, MD as Consulting Physician (Oncology) Pa, [facility])  Extended Emergency Contact Information Primary Emergency Contact: Kelsie Maryruth FALCON Address: [address]          [place], [place] [address] [place] Home Phone: [phone] Mobile Phone: [phone] Relation: Daughter  Goals of care: Advanced Directive information    10/27/2023   12:23 PM  Advanced Directives  Does Patient Have a Medical Advance Directive? Yes  Type of Estate agent of [place];Out of facility DNR (pink MOST or yellow form)  Does patient want to make changes to medical advance directive? No - Patient declined  Copy of Healthcare Power of Attorney in Chart? Yes - validated most recent copy scanned in chart (See row information)     No chief complaint on file.   HPI:  Pt is a 89 y.o. female seen today for medical management of chronic disease. ***   Past Medical History:  Diagnosis Date   Anxiety    Benign breast lumps    Blood clotting disorder (HCC)    Bone cancer (HCC)    head of femur, left leg ... 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  Oncotype recurrence score: 1.  DCIS, margin less than 0.5 mm.  Negative sentinel node.  Accelerated partial breast radiation.   Cervicalgia    Chronic kidney disease    kidney stones   Colon cancer [facility])    patient unaware of this   Complication of anesthesia    mood alteration / not sure if d/t pain medicine or anesthesia as she passed out    Cough    NASAL DRIP / SNEEZING / SORE THROAT MOSTLY CONSTANT   Depression    Diabetes (HCC)    Diverticulitis    Dizzy    DVT (deep  venous thrombosis) (HCC) 07/27/2017   GERD (gastroesophageal reflux disease)    H/O blood clots    arm and leg   HBP (high blood pressure)    Hematuria    gross   Hemorrhoids    HLD (hyperlipidemia)    Microscopic hematuria 06/02/2015   Muscle pain    Osteoarthritis    Osteoporosis    Panic disorder    Personal history of radiation therapy    PND (post-nasal drip)    CHRONIC WITH SORE THROAT AND SNEEZING   Reflux    Sepsis (HCC)    Sepsis due to pneumonia (HCC) 03/20/2023   Sepsis, unspecified organism (HCC) 12/15/2021   Formatting of this note might be different from the original.  Last Assessment & Plan: Formatting of this note might be different from the original. Appears to be multifactorial and secondary to healthcare associated pneumonia as well as a UTI CT angio shows asymmetric airspace disease at the left base and posteriorly in the right upper lobe concerning for pneumonia Patient also has pyuria Patient   Severe sepsis (HCC) 12/15/2021   Skin cancer    nose   Swelling    Tremor    Tremors of nervous system    Ureteral stone with hydronephrosis 11/26/2018   Past Surgical History:  Procedure Laterality Date  APPENDECTOMY  1962   BREAST BIOPSY Right 2006?   benign   BREAST BIOPSY Left 2019   invasive mammary carcinoma   BREAST CYST EXCISION Left 07/27/2017   Procedure: SKIN CYST EXCISED;  Surgeon: Julie Acosta Julie Acosta ORN, MD;  Location: [facility] ORS;  Service: General;  Laterality: Left;   BREAST LUMPECTOMY Left 07/2017   invasive mammary, DCIS  mammosite   BREAST SURGERY Right 2019   CARPAL TUNNEL RELEASE Right    CATARACT EXTRACTION W/PHACO Left 11/11/2016   Procedure: CATARACT EXTRACTION PHACO AND INTRAOCULAR LENS PLACEMENT (IOC);  Surgeon: Jaye Fallow, MD;  Location: [facility] ORS;  Service: Ophthalmology;  Laterality: Left;  US  01:07.9 AP% 19.2 CDE 13.02 Fluid pack lot # [id number]   CATARACT EXTRACTION W/PHACO Right 12/09/2016   Procedure: CATARACT EXTRACTION PHACO  AND INTRAOCULAR LENS PLACEMENT (IOC);  Surgeon: Jaye Fallow, MD;  Location: [facility] ORS;  Service: Ophthalmology;  Laterality: Right;  US  00:49 AP% 21.2 CDE 10.39 Fluid pack lot # [id number]   CHOLECYSTECTOMY     COLONOSCOPY WITH PROPOFOL  N/A 11/22/2014   Procedure: COLONOSCOPY WITH PROPOFOL ;  Surgeon: Lamar ONEIDA Holmes, MD;  Location: [facility] ENDOSCOPY;  Service: Endoscopy;  Laterality: N/A;   CYSTOSCOPY WITH STENT PLACEMENT Bilateral 11/27/2018   Procedure: CYSTOSCOPY WITH STENT PLACEMENT;  Surgeon: Nieves Cough, MD;  Location: [facility] ORS;  Service: Urology;  Laterality: Bilateral;   CYSTOSCOPY/URETEROSCOPY/HOLMIUM LASER/STENT PLACEMENT Bilateral 12/17/2018   Procedure: CYSTOSCOPY/URETEROSCOPY/HOLMIUM LASER/STENT Exchange;  Surgeon: Francisca Redell BROCKS, MD;  Location: [facility] ORS;  Service: Urology;  Laterality: Bilateral;   ESOPHAGOGASTRODUODENOSCOPY  11/22/2014   Procedure: ESOPHAGOGASTRODUODENOSCOPY (EGD);  Surgeon: Lamar ONEIDA Holmes, MD;  Location: [facility] ENDOSCOPY;  Service: Endoscopy;;   ESOPHAGOGASTRODUODENOSCOPY (EGD) WITH PROPOFOL  N/A 03/05/2017   Procedure: ESOPHAGOGASTRODUODENOSCOPY (EGD) WITH PROPOFOL ;  Surgeon: Unk Corinn Skiff, MD;  Location: [facility] ENDOSCOPY;  Service: Gastroenterology;  Laterality: N/A;   EXTRACORPOREAL SHOCK WAVE LITHOTRIPSY Right 03/04/2018   Procedure: EXTRACORPOREAL SHOCK WAVE LITHOTRIPSY (ESWL);  Surgeon: Francisca Redell BROCKS, MD;  Location: [facility] ORS;  Service: Urology;  Laterality: Right;   EYE SURGERY Bilateral 2012   cataract extraction with iol   GALLBLADDER SURGERY  1968   hemi pelvectomy     left side at age 28   HEMIPELVIC Left 1962   LEG SURGERY Left    AMPUTATION d/t cancer at 89 years old   MASTECTOMY, PARTIAL Left 07/27/2017   Procedure: MASTECTOMY PARTIAL;  Surgeon: Julie Acosta Julie Acosta ORN, MD;  Location: [facility] ORS;  Service: General;  Laterality: Left;   MOUTH SURGERY  2019   teeth pulled with a bridge insertion.   fell out 12/16/18   OVARY SURGERY Right     cyst removed   SAVORY DILATION  11/22/2014   Procedure: SAVORY DILATION;  Surgeon: Lamar ONEIDA Holmes, MD;  Location: [facility] ENDOSCOPY;  Service: Endoscopy;;   SENTINEL NODE BIOPSY Left 07/27/2017   Procedure: SENTINEL NODE BIOPSY;  Surgeon: Julie Acosta Julie Acosta ORN, MD;  Location: [facility] ORS;  Service: General;  Laterality: Left;   SKIN CANCER EXCISION     nose and arm and face   TEE WITHOUT CARDIOVERSION N/A 07/25/2019   Procedure: TRANSESOPHAGEAL ECHOCARDIOGRAM (TEE);  Surgeon: Hester Wolm PARAS, MD;  Location: [facility] ORS;  Service: Cardiovascular;  Laterality: N/A;   TONSILLECTOMY      Allergies  Allergen Reactions   Ciprofloxacin  Hives   Codeine Other (See Comments)    HALLUCINATIONS   Tape     Rash and skin irritation/ paper tape and tegaderm OK   Ciprocinonide [Fluocinolone] Other (See  Comments)    unknown   Amoxicillin Other (See Comments)    Upset stomach Has patient had a PCN reaction causing immediate rash, facial/tongue/throat swelling, SOB or lightheadedness with hypotension: No Has patient had a PCN reaction causing severe rash involving mucus membranes or skin necrosis: No Has patient had a PCN reaction that required hospitalization: No Has patient had a PCN reaction occurring within the last 10 years: Yes If all of the above answers are NO, then may proceed with Cephalosporin use.;   Cefuroxime  Other (See Comments)    OK INTRACAMERALLY PER DR WLP, upset stomach   Latex Rash    Rast test NEGATIVE   Lipitor [Atorvastatin] Other (See Comments)    unknown    Outpatient Encounter Medications as of 01/12/2024  Medication Sig   acetaminophen  (TYLENOL ) 650 MG CR tablet Take 650 mg by mouth every 8 (eight) hours as needed for pain.   Cholecalciferol  (VITAMIN D -1000 MAX ST) 25 MCG (1000 UT) tablet Take 1,000 Units by mouth daily.   dextromethorphan-guaiFENesin  (ROBITUSSIN-DM) 10-100 MG/5ML liquid Take 10 mLs by mouth every 4 (four) hours as needed for cough.   diclofenac  Sodium  (VOLTAREN ) 1 % GEL Apply 4 g topically. Apply to stump topically every 6 hours as needed. Apply to right knee topically three times daily for pain.   fluticasone  (FLONASE ) 50 MCG/ACT nasal spray Place 2 sprays into both nostrils 2 (two) times daily.   gabapentin  (NEURONTIN ) 100 MG capsule Take 100 mg by mouth 3 (three) times daily.   GLIPIZIDE  XL 2.5 MG 24 hr tablet Take 2.5 mg by mouth daily.    hydrocortisone 2.5 % cream Apply to Hemorrhoids/buttucks topically every 8 hours as needed for hemorrhoids.   ipratropium-albuterol  (DUONEB) 0.5-2.5 (3) MG/3ML SOLN Take 3 mLs by nebulization every 4 (four) hours as needed.   loratadine  (CLARITIN ) 10 MG tablet Take 10 mg by mouth daily.   LORazepam  (ATIVAN ) 1 MG tablet Take by mouth. (Patient not taking: Reported on 12/24/2023)   melatonin 3 MG TABS tablet Take 3 mg by mouth at bedtime.   metFORMIN  (GLUCOPHAGE ) 1000 MG tablet Take 1,000 mg by mouth 2 (two) times daily.   metoprolol  tartrate (LOPRESSOR ) 25 MG tablet Take 25 mg by mouth 2 (two) times daily.   metroNIDAZOLE  (METROGEL ) 1 % gel Apply topically daily.   mupirocin ointment (BACTROBAN) 2 % Apply 1 Application topically 3 (three) times daily. (Patient not taking: Reported on 12/24/2023)   ondansetron  (ZOFRAN ) 4 MG tablet Take 4 mg by mouth every 4 (four) hours as needed for nausea or vomiting.   OXYGEN 2lpm as needed for sats less than 88% every 1 hour as needed   pantoprazole  (PROTONIX ) 40 MG tablet Take 40 mg by mouth daily.   PARoxetine  (PAXIL ) 30 MG tablet Take 30 mg by mouth daily.   Polyethyl Glycol-Propyl Glycol 0.4-0.3 % SOLN Apply to eye 2 (two) times daily as needed.   polyethylene glycol (MIRALAX  / GLYCOLAX ) 17 g packet Take 17 g by mouth daily as needed.   PRADAXA  150 MG CAPS capsule TAKE 1 CAPSULE BY MOUTH TWICE DAILY START TREATMENT 10 HRS LAST DOSE OF LOVENOX    SUNSCREEN SPF50 EX Apply topically. Apply to exposed skin topically every 2 hours as needed for Sunburn Prevention    tiZANidine  (ZANAFLEX ) 2 MG tablet Take 2 mg by mouth every 12 (twelve) hours as needed for muscle spasms.   No facility-administered encounter medications on file as of 01/12/2024.    Review of Systems ***  Immunization History  Administered Date(s) Administered   INFLUENZA, HIGH DOSE SEASONAL PF 03/07/2023   Influenza-Unspecified 02/03/2020, 02/18/2021, 02/18/2022   Moderna Covid-19 Fall Seasonal Vaccine 69yrs & older 08/12/2022   PNEUMOCOCCAL CONJUGATE-20 01/17/2023   Pneumococcal Conjugate-13 09/28/2017   Tdap 12/31/2022   Unspecified SARS-COV-2 Vaccination 05/20/2019, 05/20/2019, 06/17/2019, 03/16/2020, 09/20/2020, 01/25/2021, 01/30/2023, 03/07/2023   Pertinent  Health Maintenance Due  Topic Date Due   Influenza Vaccine  12/04/2023   HEMOGLOBIN A1C  03/10/2024   OPHTHALMOLOGY EXAM  11/11/2024   FOOT EXAM  12/24/2024   DEXA SCAN  Completed      12/19/2021    7:00 AM 12/19/2021   12:00 PM 01/24/2022   11:20 AM 12/11/2022    1:31 PM 12/24/2023   11:58 AM  Fall Risk  Falls in the past year?    0 0  (RETIRED) Patient Fall Risk Level High fall risk  High fall risk  High fall risk     Patient at Risk for Falls Due to     Impaired balance/gait;Impaired mobility     Data saved with a previous flowsheet row definition   Functional Status Survey:    There were no vitals filed for this visit. There is no height or weight on file to calculate BMI. Physical Exam***  Labs reviewed: Recent Labs    03/28/23 0426 03/29/23 0414 03/30/23 0642 04/06/23 0000 07/06/23 0000 08/04/23 1011 09/03/23 1116  NA 138 138 137   < > 137 137 138  K 4.2 4.4 5.5*   < > 4.7 4.0 4.1  CL 101 103 103   < > 103 102 104  CO2 29 27 25    < > 28* 25 24  GLUCOSE 177* 173* 202*  --   --  191* 175*  BUN 11 10 8    < > 14 14 16   CREATININE 0.49 0.50 0.62   < > 0.5 0.71 0.62  CALCIUM 8.7* 9.1 9.3   < > 10.9* 11.0* 10.2  MG 1.5* 1.9 1.7  --   --   --   --   PHOS 2.5 1.9* 2.4*  --   --   --   --    < > =  values in this interval not displayed.   Recent Labs    03/21/23 0502 03/22/23 0500 03/23/23 0507  AST 18 15 22   ALT 10 12 14   ALKPHOS 45 46 53  BILITOT 0.4 <0.2 0.4  PROT 5.7* 5.9* 6.3*  ALBUMIN 2.8* 2.7* 2.8*   Recent Labs    03/19/23 1849 03/21/23 0502 03/28/23 0426 03/29/23 0414 04/06/23 0000 08/04/23 1011  WBC 13.1*   < > 11.0* 10.1 10.3 10.5  NEUTROABS 9.8*  --   --   --  7,025.00 7.2  HGB 12.6   < > 10.6* 10.9* 11.3* 12.7  HCT 40.5   < > 34.1* 35.1* 36 41.4  MCV 91.4   < > 90.9 92.9  --  85.4  PLT 244   < > 278 312 264 241   < > = values in this interval not displayed.   Lab Results  Component Value Date   TSH 3.77 04/16/2023   Lab Results  Component Value Date   HGBA1C 7.6 09/08/2023   Lab Results  Component Value Date   CHOL 110 12/15/2021   HDL 19 (L) 12/15/2021   LDLCALC 46 12/15/2021   TRIG 223 (H) 12/15/2021   CHOLHDL 5.8 12/15/2021    Significant Diagnostic Results in last 30  days:  No results found.  Assessment/Plan No problem-specific Assessment & Plan notes found for this encounter.     Kilah Drahos K. Caro BODILY [facility] [phone]

## 2024-01-18 DIAGNOSIS — L821 Other seborrheic keratosis: Secondary | ICD-10-CM | POA: Diagnosis not present

## 2024-01-18 DIAGNOSIS — L905 Scar conditions and fibrosis of skin: Secondary | ICD-10-CM | POA: Diagnosis not present

## 2024-01-18 DIAGNOSIS — L814 Other melanin hyperpigmentation: Secondary | ICD-10-CM | POA: Diagnosis not present

## 2024-01-20 ENCOUNTER — Encounter: Payer: Self-pay | Admitting: Pharmacist

## 2024-01-20 NOTE — Progress Notes (Signed)
   01/20/2024  Patient ID: Julie Acosta, female   DOB: 08/14/1933, 89 y.o.   MRN: [id number]  Pharmacy Quality Measure Review  This patient is appearing on a report for being at risk of failing the adherence measure for diabetes medications this calendar year.   Medication: Metformin  500 mg  Last fill date: 10/27/23  for 30 day supply  Patient lives in a skilled nursing facility. She gets her meds filled at [facility]. Metformin  500 mg was last filled 10/27/23 but the dose was increased to 1000 mg and Metformin  1000 mg was last filled 11/26/23.  [facility] fills prescriptions in 7 day cycle packs and does monthly composite billing. There is often a lag between their fill dates and the information in Dr. Annemarie. Will assume passing measure due to dose change.  HgA1c 7.6%  Cassius DOROTHA Brought, PharmD, [facility] Clinical Pharmacist [phone]

## 2024-01-22 ENCOUNTER — Ambulatory Visit (INDEPENDENT_AMBULATORY_CARE_PROVIDER_SITE_OTHER): Payer: Medicare Other

## 2024-01-22 ENCOUNTER — Ambulatory Visit (INDEPENDENT_AMBULATORY_CARE_PROVIDER_SITE_OTHER): Payer: Medicare Other | Admitting: Nurse Practitioner

## 2024-01-22 ENCOUNTER — Encounter (INDEPENDENT_AMBULATORY_CARE_PROVIDER_SITE_OTHER): Payer: Self-pay | Admitting: Nurse Practitioner

## 2024-01-22 VITALS — BP 139/72 | HR 71

## 2024-01-22 DIAGNOSIS — E119 Type 2 diabetes mellitus without complications: Secondary | ICD-10-CM

## 2024-01-22 DIAGNOSIS — I10 Essential (primary) hypertension: Secondary | ICD-10-CM | POA: Diagnosis not present

## 2024-01-22 DIAGNOSIS — I890 Lymphedema, not elsewhere classified: Secondary | ICD-10-CM | POA: Diagnosis not present

## 2024-01-22 DIAGNOSIS — I739 Peripheral vascular disease, unspecified: Secondary | ICD-10-CM | POA: Diagnosis not present

## 2024-01-22 DIAGNOSIS — I82509 Chronic embolism and thrombosis of unspecified deep veins of unspecified lower extremity: Secondary | ICD-10-CM | POA: Diagnosis not present

## 2024-01-24 ENCOUNTER — Encounter (INDEPENDENT_AMBULATORY_CARE_PROVIDER_SITE_OTHER): Payer: Self-pay | Admitting: Nurse Practitioner

## 2024-01-24 NOTE — Progress Notes (Signed)
 Subjective:    Patient ID: Julie Acosta, female    DOB: 04/03/1934, 89 y.o.   MRN: [id number] Chief Complaint  Patient presents with   Follow-up     1 year ABI + see gs/fb     The patient returns to the office for followup and review of the noninvasive studies.  The patient also has significant lymphedema.  This is also been under good control.  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  There have been no interval changes in lower extremity symptoms. No interval shortening of the patient's claudication distance or development of rest pain symptoms. No new ulcers or wounds have occurred since the last visit.  There have been no significant changes to the patient's overall health care.  The patient denies amaurosis fugax or recent TIA symptoms. There are no documented recent neurological changes noted. There is no history of DVT, PE or superficial thrombophlebitis. The patient denies recent episodes of angina or shortness of breath.   ABI Rt=1.124 and Lt=n/a  (previous ABI's Rt=1.16 and Lt=n/a) Duplex ultrasound of the the patient has triphasic tibial artery waveforms with good toe waveforms in the right lower extremity    Review of Systems  Cardiovascular:  Negative for leg swelling.  Musculoskeletal:  Positive for arthralgias and gait problem.  All other systems reviewed and are negative.      Objective:   Physical Exam Vitals reviewed.  HENT:     Head: Normocephalic.  Cardiovascular:     Rate and Rhythm: Normal rate.     Pulses: Normal pulses.  Pulmonary:     Effort: Pulmonary effort is normal.  Musculoskeletal:     Right lower leg: Edema present.  Skin:    General: Skin is warm and dry.  Neurological:     Mental Status: She is alert and oriented to person, place, and time.  Psychiatric:        Mood and Affect: Mood normal.        Behavior: Behavior normal.        Thought Content: Thought content normal.         Judgment: Judgment normal.     BP 139/72   Pulse 71   Past Medical History:  Diagnosis Date   Anxiety    Benign breast lumps    Blood clotting disorder (HCC)    Bone cancer (HCC)    head of femur, left leg ... 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  Oncotype recurrence score: 1.  DCIS, margin less than 0.5 mm.  Negative sentinel node.  Accelerated partial breast radiation.   Cervicalgia    Chronic kidney disease    kidney stones   Colon cancer [facility])    patient unaware of this   Complication of anesthesia    mood alteration / not sure if d/t pain medicine or anesthesia as she passed out    Cough    NASAL DRIP / SNEEZING / SORE THROAT MOSTLY CONSTANT   Depression    Diabetes (HCC)    Diverticulitis    Dizzy    DVT (deep venous thrombosis) (HCC) 07/27/2017   GERD (gastroesophageal reflux disease)    H/O blood clots    arm and leg   HBP (high blood pressure)    Hematuria    gross  Hemorrhoids    HLD (hyperlipidemia)    Microscopic hematuria 06/02/2015   Muscle pain    Osteoarthritis    Osteoporosis    Panic disorder    Personal history of radiation therapy    PND (post-nasal drip)    CHRONIC WITH SORE THROAT AND SNEEZING   Reflux    Sepsis (HCC)    Sepsis due to pneumonia (HCC) 03/20/2023   Sepsis, unspecified organism (HCC) 12/15/2021   Formatting of this note might be different from the original.  Last Assessment & Plan: Formatting of this note might be different from the original. Appears to be multifactorial and secondary to healthcare associated pneumonia as well as a UTI CT angio shows asymmetric airspace disease at the left base and posteriorly in the right upper lobe concerning for pneumonia Patient also has pyuria Patient   Severe sepsis (HCC) 12/15/2021   Skin cancer    nose   Swelling    Tremor    Tremors of nervous  system    Ureteral stone with hydronephrosis 11/26/2018    Social History   Socioeconomic History   Marital status: Divorced    Spouse name: Not on file   Number of children: Not on file   Years of education: Not on file   Highest education level: Not on file  Occupational History   Occupation: Runner, broadcasting/film/video    Comment: retired  Tobacco Use   Smoking status: Never   Smokeless tobacco: Never  Vaping Use   Vaping status: Never Used  Substance and Sexual Activity   Alcohol  use: No   Drug use: No   Sexual activity: Not Currently  Other Topics Concern   Not on file  Social History Narrative   [facility] lives [facility]   Social Drivers of Health   Financial Resource Strain: Low Risk  (02/16/2023)   Received from [facility]   Overall Financial Resource Strain (CARDIA)    Difficulty of Paying Living Expenses: Not hard at all  Food Insecurity: No Food Insecurity (03/20/2023)   Hunger Vital Sign    Worried About Running Out of Food in the Last Year: Never true    Ran Out of Food in the Last Year: Never true  Transportation Needs: No Transportation Needs (03/20/2023)   PRAPARE - Administrator, Civil Service (Medical): No    Lack of Transportation (Non-Medical): No  Physical Activity: Not on file  Stress: Not on file  Social Connections: Not on file  Intimate Partner Violence: Not At Risk (03/20/2023)   Humiliation, Afraid, Rape, and Kick questionnaire    Fear of Current or Ex-Partner: No    Emotionally Abused: No    Physically Abused: No    Sexually Abused: No    Past Surgical History:  Procedure Laterality Date   APPENDECTOMY  1962   BREAST BIOPSY Right 2006?   benign   BREAST BIOPSY Left 2019   invasive mammary carcinoma   BREAST CYST EXCISION Left 07/27/2017   Procedure: SKIN CYST EXCISED;  Surgeon: Dessa Reyes ORN, MD;  Location: [facility] ORS;  Service: General;  Laterality: Left;   BREAST LUMPECTOMY Left  07/2017   invasive mammary, DCIS  mammosite   BREAST SURGERY Right 2019   CARPAL TUNNEL RELEASE Right    CATARACT EXTRACTION W/PHACO Left 11/11/2016   Procedure: CATARACT EXTRACTION PHACO AND INTRAOCULAR LENS PLACEMENT (IOC);  Surgeon: Jaye Fallow, MD;  Location: [facility] ORS;  Service: Ophthalmology;  Laterality: Left;  US   01:07.9 AP% 19.2 CDE 13.02 Fluid pack lot # [id number]   CATARACT EXTRACTION W/PHACO Right 12/09/2016   Procedure: CATARACT EXTRACTION PHACO AND INTRAOCULAR LENS PLACEMENT (IOC);  Surgeon: Jaye Fallow, MD;  Location: [facility] ORS;  Service: Ophthalmology;  Laterality: Right;  US  00:49 AP% 21.2 CDE 10.39 Fluid pack lot # [id number]   CHOLECYSTECTOMY     COLONOSCOPY WITH PROPOFOL  N/A 11/22/2014   Procedure: COLONOSCOPY WITH PROPOFOL ;  Surgeon: Lamar ONEIDA Holmes, MD;  Location: [facility] ENDOSCOPY;  Service: Endoscopy;  Laterality: N/A;   CYSTOSCOPY WITH STENT PLACEMENT Bilateral 11/27/2018   Procedure: CYSTOSCOPY WITH STENT PLACEMENT;  Surgeon: Nieves Cough, MD;  Location: [facility] ORS;  Service: Urology;  Laterality: Bilateral;   CYSTOSCOPY/URETEROSCOPY/HOLMIUM LASER/STENT PLACEMENT Bilateral 12/17/2018   Procedure: CYSTOSCOPY/URETEROSCOPY/HOLMIUM LASER/STENT Exchange;  Surgeon: Francisca Redell BROCKS, MD;  Location: [facility] ORS;  Service: Urology;  Laterality: Bilateral;   ESOPHAGOGASTRODUODENOSCOPY  11/22/2014   Procedure: ESOPHAGOGASTRODUODENOSCOPY (EGD);  Surgeon: Lamar ONEIDA Holmes, MD;  Location[facility] ENDOSCOPY;  Service: Endoscopy;;   ESOPHAGOGASTRODUODENOSCOPY (EGD) WITH PROPOFOL  N/A 03/05/2017   Procedure: ESOPHAGOGASTRODUODENOSCOPY (EGD) WITH PROPOFOL ;  Surgeon: Unk Corinn Skiff, MD;  Location: [facility] ENDOSCOPY;  Service: Gastroenterology;  Laterality: N/A;   EXTRACORPOREAL SHOCK WAVE LITHOTRIPSY Right 03/04/2018   Procedure: EXTRACORPOREAL SHOCK WAVE LITHOTRIPSY (ESWL);  Surgeon: Francisca Redell BROCKS, MD;  Location: [facility] ORS;  Service: Urology;  Laterality: Right;   EYE SURGERY  Bilateral 2012   cataract extraction with iol   GALLBLADDER SURGERY  1968   hemi pelvectomy     left side at age 30   HEMIPELVIC Left 1962   LEG SURGERY Left    AMPUTATION d/t cancer at 89 years old   MASTECTOMY, PARTIAL Left 07/27/2017   Procedure: MASTECTOMY PARTIAL;  Surgeon: Dessa Reyes ORN, MD;  Location: [facility] ORS;  Service: General;  Laterality: Left;   MOUTH SURGERY  2019   teeth pulled with a bridge insertion.   fell out 12/16/18   OVARY SURGERY Right    cyst removed   SAVORY DILATION  11/22/2014   Procedure: SAVORY DILATION;  Surgeon: Lamar ONEIDA Holmes, MD;  Location[facility] ENDOSCOPY;  Service: Endoscopy;;   SENTINEL NODE BIOPSY Left 07/27/2017   Procedure: SENTINEL NODE BIOPSY;  Surgeon: Dessa Reyes ORN, MD;  Location: [facility] ORS;  Service: General;  Laterality: Left;   SKIN CANCER EXCISION     nose and arm and face   TEE WITHOUT CARDIOVERSION N/A 07/25/2019   Procedure: TRANSESOPHAGEAL ECHOCARDIOGRAM (TEE);  Surgeon: Hester Wolm PARAS, MD;  Location: [facility] ORS;  Service: Cardiovascular;  Laterality: N/A;   TONSILLECTOMY      Family History  Problem Relation Age of Onset   Breast cancer Paternal Aunt    Ovarian cancer Sister    Prostate cancer Father    Stroke Mother    Kidney cancer Neg Hx    Bladder Cancer Neg Hx     Allergies  Allergen Reactions   Ciprofloxacin  Hives   Codeine Other (See Comments)    HALLUCINATIONS   Tape     Rash and skin irritation/ paper tape and tegaderm OK   Ciprocinonide [Fluocinolone] Other (See Comments)    unknown   Amoxicillin Other (See Comments)    Upset stomach Has patient had a PCN reaction causing immediate rash, facial/tongue/throat swelling, SOB or lightheadedness with hypotension: No Has patient had a PCN reaction causing severe rash involving mucus membranes or skin necrosis: No Has patient had a PCN reaction that required hospitalization: No Has patient had a PCN reaction  occurring within the last 10 years: Yes If all of  the above answers are NO, then may proceed with Cephalosporin use.;   Cefuroxime  Other (See Comments)    OK INTRACAMERALLY PER DR WLP, upset stomach   Latex Rash    Rast test NEGATIVE   Lipitor [Atorvastatin] Other (See Comments)    unknown       Latest Ref Rng & Units 08/04/2023   10:11 AM 04/06/2023   12:00 AM 03/29/2023    4:14 AM  CBC  WBC 4.0 - 10.5 K/uL 10.5  10.3     10.1   Hemoglobin 12.0 - 15.0 g/dL 87.2  88.6     89.0   Hematocrit 36.0 - 46.0 % 41.4  36     35.1   Platelets 150 - 400 K/uL 241  264     312      This result is from an external source.      CMP     Component Value Date/Time   NA 138 09/03/2023 1116   NA 137 07/06/2023 0000   NA 139 07/01/2013 1721   K 4.1 09/03/2023 1116   K 4.6 07/01/2013 1721   CL 104 09/03/2023 1116   CL 108 (H) 07/01/2013 1721   CO2 24 09/03/2023 1116   CO2 23 07/01/2013 1721   GLUCOSE 175 (H) 09/03/2023 1116   GLUCOSE 150 (H) 07/01/2013 1721   BUN 16 09/03/2023 1116   BUN 14 07/06/2023 0000   BUN 21 (H) 07/01/2013 1721   CREATININE 0.62 09/03/2023 1116   CREATININE 0.95 07/01/2013 1721   CALCIUM 10.2 09/03/2023 1116   CALCIUM 11.9 (H) 01/24/2022 1157   PROT 6.3 (L) 03/23/2023 0507   PROT 7.4 07/01/2013 1721   ALBUMIN 2.8 (L) 03/23/2023 0507   ALBUMIN 3.6 07/01/2013 1721   AST 22 03/23/2023 0507   AST 16 07/01/2013 1721   ALT 14 03/23/2023 0507   ALT 19 07/01/2013 1721   ALKPHOS 53 03/23/2023 0507   ALKPHOS 104 07/01/2013 1721   BILITOT 0.4 03/23/2023 0507   BILITOT 0.2 07/01/2013 1721   GFRNONAA >60 09/03/2023 1116   GFRNONAA 58 (L) 07/01/2013 1721   GFRAA >60 01/24/2020 1023   GFRAA >60 07/01/2013 1721     No results found.     Assessment & Plan:   1. PAD (peripheral artery disease) (HCC)  Recommend:  The patient has evidence of atherosclerosis of the lower extremities with claudication.  The patient does not voice lifestyle limiting changes at this point in time.  Noninvasive studies do not  suggest clinically significant change.  No invasive studies, angiography or surgery at this time The patient should continue walking and begin a more formal exercise program.  The patient should continue antiplatelet therapy and aggressive treatment of the lipid abnormalities  No changes in the patient's medications at this time  Continued surveillance is indicated as atherosclerosis is likely to progress with time.    The patient will continue follow up with noninvasive studies as ordered.    2. Primary hypertension Continue antihypertensive medications as already ordered, these medications have been reviewed and there are no changes at this time.   3. Controlled type 2 diabetes mellitus without complication, without long-term current use of insulin  (HCC) Continue hypoglycemic medications as already ordered, these medications have been reviewed and there are no changes at this time.  Hgb A1C to be monitored as already arranged by primary service    4. Lymphedema The patient has excellent  control of her swelling.  Advised to continue with use of medical grade compression wraps as well as elevation daily.  Current Outpatient Medications on File Prior to Visit  Medication Sig Dispense Refill   acetaminophen  (TYLENOL ) 650 MG CR tablet Take 650 mg by mouth every 8 (eight) hours as needed for pain.     Cholecalciferol  (VITAMIN D -1000 MAX ST) 25 MCG (1000 UT) tablet Take 1,000 Units by mouth daily.     dextromethorphan-guaiFENesin  (ROBITUSSIN-DM) 10-100 MG/5ML liquid Take 10 mLs by mouth every 4 (four) hours as needed for cough.     diclofenac  Sodium (VOLTAREN ) 1 % GEL Apply 4 g topically. Apply to stump topically every 6 hours as needed. Apply to right knee topically three times daily for pain.     fluticasone  (FLONASE ) 50 MCG/ACT nasal spray Place 2 sprays into both nostrils 2 (two) times daily.     gabapentin  (NEURONTIN ) 100 MG capsule Take 100 mg by mouth 3 (three) times daily.      GLIPIZIDE  XL 2.5 MG 24 hr tablet Take 2.5 mg by mouth daily.      hydrocortisone 2.5 % cream Apply to Hemorrhoids/buttucks topically every 8 hours as needed for hemorrhoids.     ipratropium-albuterol  (DUONEB) 0.5-2.5 (3) MG/3ML SOLN Take 3 mLs by nebulization every 4 (four) hours as needed.     loratadine  (CLARITIN ) 10 MG tablet Take 10 mg by mouth daily.     melatonin 3 MG TABS tablet Take 3 mg by mouth at bedtime.     metFORMIN  (GLUCOPHAGE ) 1000 MG tablet Take 1,000 mg by mouth 2 (two) times daily.     metoprolol  tartrate (LOPRESSOR ) 25 MG tablet Take 25 mg by mouth 2 (two) times daily.     metroNIDAZOLE  (METROGEL ) 1 % gel Apply topically daily.     ondansetron  (ZOFRAN ) 4 MG tablet Take 4 mg by mouth every 4 (four) hours as needed for nausea or vomiting.     OXYGEN 2lpm as needed for sats less than 88% every 1 hour as needed     pantoprazole  (PROTONIX ) 40 MG tablet Take 40 mg by mouth daily.     pantoprazole  (PROTONIX ) 40 MG tablet Take 40 mg by mouth daily.     PARoxetine  (PAXIL ) 30 MG tablet Take 30 mg by mouth daily.     Polyethyl Glycol-Propyl Glycol 0.4-0.3 % SOLN Apply to eye 2 (two) times daily as needed.     polyethylene glycol (MIRALAX  / GLYCOLAX ) 17 g packet Take 17 g by mouth daily as needed.     PRADAXA  150 MG CAPS capsule TAKE 1 CAPSULE BY MOUTH TWICE DAILY START TREATMENT 10 HRS LAST DOSE OF LOVENOX  60 capsule 2   sitaGLIPtin (JANUVIA) 100 MG tablet Take 100 mg by mouth daily.     Sodium Fluoride (PREVIDENT 5000 BOOSTER PLUS) 1.1 % PSTE Place onto teeth at bedtime.     tiZANidine  (ZANAFLEX ) 2 MG tablet Take 2 mg by mouth every 12 (twelve) hours as needed for muscle spasms.     LORazepam  (ATIVAN ) 1 MG tablet Take by mouth. (Patient not taking: Reported on 01/22/2024)     mupirocin ointment (BACTROBAN) 2 % Apply 1 Application topically 3 (three) times daily. (Patient not taking: Reported on 01/22/2024)     SUNSCREEN SPF50 EX Apply topically. Apply to exposed skin topically every 2  hours as needed for Sunburn Prevention (Patient not taking: Reported on 01/22/2024)     No current facility-administered medications on file prior to visit.  There are no Patient Instructions on file for this visit. No follow-ups on file.   Jaramie Bastos E Carinna Newhart, NP

## 2024-01-25 LAB — VAS US ABI WITH/WO TBI: Right ABI: 1.24

## 2024-01-27 ENCOUNTER — Encounter: Payer: Self-pay | Admitting: Oncology

## 2024-02-03 ENCOUNTER — Inpatient Hospital Stay

## 2024-02-03 ENCOUNTER — Inpatient Hospital Stay: Attending: Oncology

## 2024-02-03 ENCOUNTER — Inpatient Hospital Stay (HOSPITAL_BASED_OUTPATIENT_CLINIC_OR_DEPARTMENT_OTHER): Admitting: Oncology

## 2024-02-03 ENCOUNTER — Encounter: Payer: Self-pay | Admitting: Oncology

## 2024-02-03 VITALS — BP 122/55 | HR 62 | Temp 97.4°F | Resp 16

## 2024-02-03 DIAGNOSIS — M810 Age-related osteoporosis without current pathological fracture: Secondary | ICD-10-CM | POA: Diagnosis not present

## 2024-02-03 DIAGNOSIS — Z853 Personal history of malignant neoplasm of breast: Secondary | ICD-10-CM | POA: Diagnosis not present

## 2024-02-03 DIAGNOSIS — Z08 Encounter for follow-up examination after completed treatment for malignant neoplasm: Secondary | ICD-10-CM

## 2024-02-03 DIAGNOSIS — C50412 Malignant neoplasm of upper-outer quadrant of left female breast: Secondary | ICD-10-CM

## 2024-02-03 LAB — BASIC METABOLIC PANEL WITH GFR
Anion gap: 6 (ref 5–15)
BUN: 17 mg/dL (ref 8–23)
CO2: 25 mmol/L (ref 22–32)
Calcium: 11.6 mg/dL — ABNORMAL HIGH (ref 8.9–10.3)
Chloride: 105 mmol/L (ref 98–111)
Creatinine, Ser: 0.59 mg/dL (ref 0.44–1.00)
GFR, Estimated: >60 mL/min (ref >60)
Glucose, Bld: 171 mg/dL — ABNORMAL HIGH (ref 70–99)
Potassium: 4.9 mmol/L (ref 3.5–5.1)
Sodium: 136 mmol/L (ref 135–145)

## 2024-02-03 LAB — CBC WITH DIFFERENTIAL/PLATELET
Abs Immature Granulocytes: 0.03 K/uL (ref 0.00–0.07)
Basophils Absolute: 0.1 K/uL (ref 0.0–0.1)
Basophils Relative: 1 %
Eosinophils Absolute: 0.1 K/uL (ref 0.0–0.5)
Eosinophils Relative: 2 %
HCT: 40.4 % (ref 36.0–46.0)
Hemoglobin: 12.5 g/dL (ref 12.0–15.0)
Immature Granulocytes: 0 %
Lymphocytes Relative: 22 %
Lymphs Abs: 2.1 K/uL (ref 0.7–4.0)
MCH: 24.7 pg — ABNORMAL LOW (ref 26.0–34.0)
MCHC: 30.9 g/dL (ref 30.0–36.0)
MCV: 79.8 fL — ABNORMAL LOW (ref 80.0–100.0)
Monocytes Absolute: 0.8 K/uL (ref 0.1–1.0)
Monocytes Relative: 9 %
Neutro Abs: 6.3 K/uL (ref 1.7–7.7)
Neutrophils Relative %: 66 %
Platelets: 241 K/uL (ref 150–400)
RBC: 5.06 MIL/uL (ref 3.87–5.11)
RDW: 17.1 % — ABNORMAL HIGH (ref 11.5–15.5)
WBC: 9.5 K/uL (ref 4.0–10.5)
nRBC: 0 % (ref 0.0–0.2)

## 2024-02-03 MED ORDER — DENOSUMAB 60 MG/ML ~~LOC~~ SOSY
60.0000 mg | PREFILLED_SYRINGE | Freq: Once | SUBCUTANEOUS | Status: AC
  Administered 2024-02-03: 60 mg via SUBCUTANEOUS
  Filled 2024-02-03: qty 1

## 2024-02-03 NOTE — Progress Notes (Unsigned)
 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  Oncotype DX score 1.  Homozygous factor V Leiden.  INTERVAL HISTORY: Patient returns to clinic today for routine 62-month evaluation and continuation of Prolia .  She continues to feel well and at her baseline.  She has worsening memory.  She continues to tolerate Pradaxa  without significant side effects. She has no neurologic complaints.  She denies any recent fevers or illnesses.  She has a good appetite and denies weight loss.  She denies any chest pain, shortness of breath, cough, or hemoptysis.  She denies any nausea, vomiting, constipation, or diarrhea.  She has no urinary complaints.  Patient offers no further specific complaints today.  REVIEW OF SYSTEMS:   Review of Systems  Constitutional: Negative.  Negative for fever, malaise/fatigue and weight loss.  Respiratory: Negative.  Negative for cough and shortness of breath.   Cardiovascular: Negative.  Negative for chest pain and leg swelling.  Gastrointestinal: Negative.  Negative for abdominal pain.  Genitourinary:  Negative for dysuria and flank pain.  Musculoskeletal:  Negative for falls and joint pain.  Skin: Negative.  Negative for rash.  Neurological: Negative.  Negative for dizziness, sensory change, weakness and headaches.  Psychiatric/Behavioral:  Positive for memory loss. The patient is not nervous/anxious.     As per HPI. Otherwise, a complete review of systems is negative.   PAST MEDICAL HISTORY: Reviewed and unchanged.  PAST  SURGICAL HISTORY: Past Surgical History:  Procedure Laterality Date  . APPENDECTOMY  1962  . BREAST BIOPSY Right 2006?   benign  . BREAST BIOPSY Left 2019   invasive mammary carcinoma  . BREAST CYST EXCISION Left 07/27/2017   Procedure: SKIN CYST EXCISED;  Surgeon: Dessa Reyes ORN, MD;  Location: [facility] ORS;  Service: General;  Laterality: Left;  . BREAST LUMPECTOMY Left 07/2017   invasive mammary, DCIS  mammosite  . BREAST SURGERY Right 2019  . CARPAL TUNNEL RELEASE Right   . CATARACT EXTRACTION W/PHACO Left 11/11/2016   Procedure: CATARACT EXTRACTION PHACO AND INTRAOCULAR LENS PLACEMENT (IOC);  Surgeon: Jaye Fallow, MD;  Location: [facility] ORS;  Service: Ophthalmology;  Laterality: Left;  US  01:07.9 AP% 19.2 CDE 13.02 Fluid pack lot # [id number]  . CATARACT EXTRACTION W/PHACO Right 12/09/2016   Procedure: CATARACT EXTRACTION PHACO AND INTRAOCULAR LENS PLACEMENT (IOC);  Surgeon: Jaye Fallow, MD;  Location: [facility] ORS;  Service: Ophthalmology;  Laterality: Right;  US  00:49 AP% 21.2 CDE 10.39 Fluid pack lot # [id number]  . CHOLECYSTECTOMY    . COLONOSCOPY WITH PROPOFOL  N/A 11/22/2014   Procedure: COLONOSCOPY WITH PROPOFOL ;  Surgeon: Lamar ONEIDA [facility], MD;  Location: [facility] ENDOSCOPY;  Service: Endoscopy;  Laterality: N/A;  . CYSTOSCOPY WITH STENT PLACEMENT Bilateral 11/27/2018   Procedure: CYSTOSCOPY WITH STENT PLACEMENT;  Surgeon: Nieves Cough, MD;  Location: [facility] ORS;  Service: Urology;  Laterality: Bilateral;  . CYSTOSCOPY/URETEROSCOPY/HOLMIUM LASER/STENT PLACEMENT Bilateral 12/17/2018   Procedure: CYSTOSCOPY/URETEROSCOPY/HOLMIUM LASER/STENT Exchange;  Surgeon: Francisca Redell BROCKS, MD;  Location: [facility] ORS;  Service: Urology;  Laterality: Bilateral;  . ESOPHAGOGASTRODUODENOSCOPY  11/22/2014   Procedure: ESOPHAGOGASTRODUODENOSCOPY (EGD);  Surgeon: Lamar ONEIDA Holmes, MD;  Location[facility] ENDOSCOPY;  Service: Endoscopy;;  . ESOPHAGOGASTRODUODENOSCOPY (EGD) WITH PROPOFOL  N/A 03/05/2017    Procedure: ESOPHAGOGASTRODUODENOSCOPY (EGD) WITH PROPOFOL ;  Surgeon: Unk Corinn Skiff, MD;  Location: [facility] ENDOSCOPY;  Service: Gastroenterology;  Laterality: N/A;  . EXTRACORPOREAL SHOCK WAVE LITHOTRIPSY Right 03/04/2018   Procedure: EXTRACORPOREAL SHOCK WAVE LITHOTRIPSY (ESWL);  Surgeon: Francisca Redell BROCKS, MD;  Location: [facility] ORS;  Service: Urology;  Laterality: Right;  . EYE SURGERY Bilateral 2012   cataract extraction with iol  . GALLBLADDER SURGERY  1968  . hemi pelvectomy     left side at age 53  . HEMIPELVIC Left 1962  . LEG SURGERY Left    AMPUTATION d/t cancer at 89 years old  . MASTECTOMY, PARTIAL Left 07/27/2017   Procedure: MASTECTOMY PARTIAL;  Surgeon: Dessa Reyes ORN, MD;  Location: [facility] ORS;  Service: General;  Laterality: Left;  . MOUTH SURGERY  2019   teeth pulled with a bridge insertion.   fell out 12/16/18  . OVARY SURGERY Right    cyst removed  . SAVORY DILATION  11/22/2014   Procedure: SAVORY DILATION;  Surgeon: Lamar ONEIDA Holmes, MD;  Location: [facility] ENDOSCOPY;  Service: Endoscopy;;  . SENTINEL NODE BIOPSY Left 07/27/2017   Procedure: SENTINEL NODE BIOPSY;  Surgeon: Dessa Reyes ORN, MD;  Location: [facility] ORS;  Service: General;  Laterality: Left;  . SKIN CANCER EXCISION     nose and arm and face  . TEE WITHOUT CARDIOVERSION N/A 07/25/2019   Procedure: TRANSESOPHAGEAL ECHOCARDIOGRAM (TEE);  Surgeon: Hester Wolm PARAS, MD;  Location: [facility] ORS;  Service: Cardiovascular;  Laterality: N/A;  . TONSILLECTOMY      FAMILY HISTORY: Family History  Problem Relation Age of Onset  . Breast cancer Paternal Aunt   . Ovarian cancer Sister   . Prostate cancer Father   . Stroke Mother   . Kidney cancer Neg Hx   . Bladder Cancer Neg Hx     ADVANCED DIRECTIVES (Y/N):  N  HEALTH MAINTENANCE: Social History   Tobacco Use  . Smoking status: Never  . Smokeless tobacco: Never  Vaping Use  . Vaping status: Never Used  Substance Use Topics  . Alcohol  use: No  . Drug use:  No     Colonoscopy:  PAP:  Bone density:  Lipid panel:  Allergies  Allergen Reactions  . Ciprofloxacin  Hives  . Codeine Other (See Comments)    HALLUCINATIONS  . Tape     Rash and skin irritation/ paper tape and tegaderm OK  . Ciprocinonide [Fluocinolone] Other (See Comments)    unknown  . Amoxicillin Other (See Comments)    Upset stomach Has patient had a PCN reaction causing immediate rash, facial/tongue/throat swelling, SOB or lightheadedness with hypotension: No Has patient had a PCN reaction causing severe rash involving mucus membranes or skin necrosis: No Has patient had a PCN reaction that required hospitalization: No Has patient had a PCN reaction occurring within the last 10 years: Yes If all of the above answers are NO, then may proceed with Cephalosporin use.;  . Cefuroxime  Other (See Comments)    OK INTRACAMERALLY PER DR WLP, upset stomach  . Latex Rash    Rast test NEGATIVE  . Lipitor [Atorvastatin] Other (See Comments)    unknown    Current Outpatient Medications  Medication Sig Dispense Refill  . acetaminophen  (TYLENOL ) 650 MG CR tablet Take 650 mg by mouth every 8 (eight) hours as needed for pain.    . Cholecalciferol  (VITAMIN D -1000 MAX ST) 25 MCG (1000 UT) tablet Take  1,000 Units by mouth daily.    SABRA dextromethorphan-guaiFENesin  (ROBITUSSIN-DM) 10-100 MG/5ML liquid Take 10 mLs by mouth every 4 (four) hours as needed for cough.    . diclofenac  Sodium (VOLTAREN ) 1 % GEL Apply 4 g topically. Apply to stump topically every 6 hours as needed. Apply to right knee topically three times daily for pain.    . fluticasone  (FLONASE ) 50 MCG/ACT nasal spray Place 2 sprays into both nostrils 2 (two) times daily.    . gabapentin  (NEURONTIN ) 100 MG capsule Take 100 mg by mouth 3 (three) times daily.    . GLIPIZIDE  XL 2.5 MG 24 hr tablet Take 2.5 mg by mouth daily.     . hydrocortisone 2.5 % cream Apply to Hemorrhoids/buttucks topically every 8 hours as needed for  hemorrhoids.    SABRA ipratropium-albuterol  (DUONEB) 0.5-2.5 (3) MG/3ML SOLN Take 3 mLs by nebulization every 4 (four) hours as needed.    . loratadine  (CLARITIN ) 10 MG tablet Take 10 mg by mouth daily.    . melatonin 3 MG TABS tablet Take 3 mg by mouth at bedtime.    . metFORMIN  (GLUCOPHAGE ) 1000 MG tablet Take 1,000 mg by mouth 2 (two) times daily.    . metoprolol  tartrate (LOPRESSOR ) 25 MG tablet Take 25 mg by mouth 2 (two) times daily.    . metroNIDAZOLE  (METROGEL ) 1 % gel Apply topically daily.    . ondansetron  (ZOFRAN ) 4 MG tablet Take 4 mg by mouth every 4 (four) hours as needed for nausea or vomiting.    . OXYGEN 2lpm as needed for sats less than 88% every 1 hour as needed    . pantoprazole  (PROTONIX ) 40 MG tablet Take 40 mg by mouth daily.    . PARoxetine  (PAXIL ) 30 MG tablet Take 30 mg by mouth daily.    . Polyethyl Glycol-Propyl Glycol 0.4-0.3 % SOLN Apply to eye 2 (two) times daily as needed.    . polyethylene glycol (MIRALAX  / GLYCOLAX ) 17 g packet Take 17 g by mouth daily as needed.    . PRADAXA  150 MG CAPS capsule TAKE 1 CAPSULE BY MOUTH TWICE DAILY START TREATMENT 10 HRS LAST DOSE OF LOVENOX  60 capsule 2  . sitaGLIPtin (JANUVIA) 100 MG tablet Take 100 mg by mouth daily.    . Sodium Fluoride (PREVIDENT 5000 BOOSTER PLUS) 1.1 % PSTE Place onto teeth at bedtime.    . tiZANidine  (ZANAFLEX ) 2 MG tablet Take 2 mg by mouth every 12 (twelve) hours as needed for muscle spasms.    . LORazepam  (ATIVAN ) 1 MG tablet Take by mouth. (Patient not taking: Reported on 02/03/2024)    . mupirocin ointment (BACTROBAN) 2 % Apply 1 Application topically 3 (three) times daily. (Patient not taking: Reported on 02/03/2024)    . pantoprazole  (PROTONIX ) 40 MG tablet Take 40 mg by mouth daily.    . SUNSCREEN SPF50 EX Apply topically. Apply to exposed skin topically every 2 hours as needed for Sunburn Prevention (Patient not taking: Reported on 02/03/2024)     No current facility-administered medications for this  visit.   Facility-Administered Medications Ordered in Other Visits  Medication Dose Route Frequency Provider Last Rate Last Admin  . denosumab  (PROLIA ) injection 60 mg  60 mg Subcutaneous Once Jazzmine Kleiman J, MD        OBJECTIVE: Vitals:   02/03/24 1101  BP: (!) 122/55  Pulse: 62  Resp: 16  Temp: (!) 97.4 F (36.3 C)  SpO2: 94%       There is  no height or weight on file to calculate BMI.    ECOG FS:2 - Symptomatic, <50% confined to bed  General: Well-developed, well-nourished, no acute distress.  Sitting in a wheelchair. Eyes: Pink conjunctiva, anicteric sclera. HEENT: Normocephalic, moist mucous membranes. Lungs: No audible wheezing or coughing. Heart: Regular rate and rhythm. Abdomen: Soft, nontender, no obvious distention. Musculoskeletal: No edema, cyanosis, or clubbing. Neuro: Alert, answering all questions appropriately. Cranial nerves grossly intact. Skin: No rashes or petechiae noted. Psych: Normal affect.   LAB RESULTS:  Lab Results  Component Value Date   NA 136 02/03/2024   K 4.9 02/03/2024   CL 105 02/03/2024   CO2 25 02/03/2024   GLUCOSE 171 (H) 02/03/2024   BUN 17 02/03/2024   CREATININE 0.59 02/03/2024   CALCIUM 11.6 (H) 02/03/2024   PROT 6.3 (L) 03/23/2023   ALBUMIN 2.8 (L) 03/23/2023   AST 22 03/23/2023   ALT 14 03/23/2023   ALKPHOS 53 03/23/2023   BILITOT 0.4 03/23/2023   GFRNONAA >60 02/03/2024   GFRAA >60 01/24/2020    Lab Results  Component Value Date   WBC 9.5 02/03/2024   NEUTROABS 6.3 02/03/2024   HGB 12.5 02/03/2024   HCT 40.4 02/03/2024   MCV 79.8 (L) 02/03/2024   PLT 241 02/03/2024     STUDIES: VAS US  ABI WITH/WO TBI Result Date: 01/25/2024  LOWER EXTREMITY DOPPLER STUDY Patient Name:  KAZUKO CLEMENCE  Date of Exam:   01/22/2024 Medical Rec #: [id number]        Accession #:    [id number] Date of Birth: 03-18-1934       Patient Gender: F Patient Age:   89 years Exam Location:  [facility] Procedure:      VAS  US  ABI WITH/WO TBI Referring Phys: --------------------------------------------------------------------------------  Indications: Peripheral artery disease. High Risk Factors: Hypertension, hyperlipidemia, Diabetes, no history of                    smoking, prior MI. Other Factors: Prior left leg amputation at age 83 for cancer                 Right foot redness.  Limitations: Today's exam was limited due to patient in wheelchair and              involuntary patient movement. Performing Technologist: Donnice Charnley RVT  Examination Guidelines: A complete evaluation includes at minimum, Doppler waveform signals and systolic blood pressure reading at the level of bilateral brachial, anterior tibial, and posterior tibial arteries, when vessel segments are accessible. Bilateral testing is considered an integral part of a complete examination. Photoelectric Plethysmograph (PPG) waveforms and toe systolic pressure readings are included as required and additional duplex testing as needed. Limited examinations for reoccurring indications may be performed as noted.  ABI Findings: +---------+------------------+-----+---------+--------+ Right    Rt Pressure (mmHg)IndexWaveform Comment  +---------+------------------+-----+---------+--------+ Brachial 137                                      +---------+------------------+-----+---------+--------+ PTA      167               1.22 triphasic         +---------+------------------+-----+---------+--------+ DP       170               1.24 triphasic         +---------+------------------+-----+---------+--------+  Great Toe93                0.68                   +---------+------------------+-----+---------+--------+ +--------+------------------+-----+--------+-------+ Left    Lt Pressure (mmHg)IndexWaveformComment +--------+------------------+-----+--------+-------+ [id number]                                     +--------+------------------+-----+--------+-------+ +-------+-----------+-----------+------------+------------+ ABI/TBIToday's ABIToday's TBIPrevious ABIPrevious TBI +-------+-----------+-----------+------------+------------+ Right  1.24       0.68       1.16        0.80         +-------+-----------+-----------+------------+------------+ Left   Amputation            Amputation               +-------+-----------+-----------+------------+------------+  Right ABIs appear essentially unchanged compared to prior study on 01/22/2023.  Summary: Right: Resting right ankle-brachial index is within normal range. The right toe-brachial index is abnormal.  *See table(s) above for measurements and observations.  Electronically signed by Selinda Gu MD on 01/25/2024 at 10:38:27 AM.    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  Homozygous factor V Leiden.  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  Patient had a lumpectomy on July 27, 2017.  She completed radiation with MammoSite treatment.  Patient completed 5 years of letrozole  in approximately March 2024.  Her most recent mammogram on April 14, 2023 was reported as BI-RADS 1.  Repeat in December 2025.   Osteoporosis: Patient's most recent bone mineral density on April 14, 2023 reported T-score of -3.6.  This is unchanged from previous.  Proceed with Prolia  as scheduled.  Return to clinic in 1 month for laboratory work only and then in 6 months for laboratory work and continuation of treatment.  Repeat bone mineral density in December 2025.   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 Homozygous factor V Leiden: Patient had multiple splenic infarcts suspicious for thrombosis while taking Xarelto .  Subsequent hypercoagulable work-up revealed the patient is homozygous for factor V Leiden.  Continue Pradaxa  150 mg every 12 hours.  She will require lifelong anticoagulation.   Hypercalcemia: Unclear etiology.  Proceed with Xgeva  as above.  Repeat laboratory work in 1 month.    Patient expressed understanding and was in agreement with this plan. She also understands that She can call clinic at any time with any questions, concerns, or complaints.    Cancer Staging  Primary cancer of upper outer quadrant of left female breast [facility]) Staging form: Breast, AJCC 8th Edition - Clinical stage from 06/12/2017: Stage IA (cT1b, cN0, cM0, G2, ER+, PR+, HER2-) - Signed by Jacobo Evalene PARAS, MD on 06/12/2017 Histologic grading system: 3 grade system Laterality: Left   Evalene PARAS Jacobo, MD   02/03/2024 11:13 AM

## 2024-02-03 NOTE — Progress Notes (Unsigned)
 Pt in for follow up and prolia .

## 2024-02-04 ENCOUNTER — Encounter: Payer: Self-pay | Admitting: Oncology

## 2024-02-18 ENCOUNTER — Non-Acute Institutional Stay (SKILLED_NURSING_FACILITY): Payer: Self-pay | Admitting: Nurse Practitioner

## 2024-02-18 ENCOUNTER — Encounter: Payer: Self-pay | Admitting: Nurse Practitioner

## 2024-02-18 DIAGNOSIS — J069 Acute upper respiratory infection, unspecified: Secondary | ICD-10-CM | POA: Diagnosis not present

## 2024-02-18 DIAGNOSIS — R0989 Other specified symptoms and signs involving the circulatory and respiratory systems: Secondary | ICD-10-CM | POA: Diagnosis not present

## 2024-02-18 DIAGNOSIS — R059 Cough, unspecified: Secondary | ICD-10-CM | POA: Diagnosis not present

## 2024-02-18 NOTE — Progress Notes (Signed)
 Location:  Other [place].  Nursing Home Room Number: [facility] [id number] Place of Service:  SNF [phone]) Harlene An, NP  PCP: Abdul Fine, MD  Patient Care Team: Abdul Fine, MD as PCP - General (Family Medicine) Dessa, Reyes ORN, MD (General Surgery) Jacobo Evalene PARAS, MD as Consulting Physician (Oncology) [facility])  Extended Emergency Contact Information Primary Emergency Contact: Kelsie Maryruth FALCON Address: [address]          [place], [place] [address] [place] Home Phone: [phone] Mobile Phone: [phone] Relation: Daughter  Goals of care: Advanced Directive information    02/03/2024   10:48 AM  Advanced Directives  Does Patient Have a Medical Advance Directive? Yes  Type of Advance Directive Out of facility DNR (pink MOST or yellow form)  Copy of Healthcare Power of Attorney in Chart? Yes - validated most recent copy scanned in chart (See row information)  Pre-existing out of facility DNR order (yellow form or pink MOST form) Pink MOST/Yellow Form most recent copy in chart - Physician notified to receive inpatient order     Chief Complaint  Patient presents with   Cough    Cough    HPI:  Pt is a 89 y.o. female seen today for an acute visit for cough. Staff reports she has been coughing for 2 weeks. Yesterday she had low grade fever but better today. Pt reports nasal congestion and sore throat over the past few days.  Reports she is now coughing up yellow sputum and occasionally will feel short of breath.  Denies increase in fatigue or malaise.  Denies wheezing No swelling noted No chest pains.    Past Medical History:  Diagnosis Date   Anxiety    Benign breast lumps    Blood clotting disorder    Bone cancer (HCC)    head of femur, left leg ... 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  Oncotype recurrence score: 1.  DCIS, margin less than 0.5 mm.  Negative sentinel node.  Accelerated partial breast radiation.   Cervicalgia    Chronic kidney disease    kidney stones   Colon cancer [facility])    patient unaware of this   Complication of anesthesia    mood alteration / not sure if d/t pain medicine or anesthesia as she passed out    Cough    NASAL DRIP / SNEEZING / SORE THROAT MOSTLY CONSTANT   Depression    Diabetes (HCC)    Diverticulitis    Dizzy    DVT (deep venous thrombosis) (HCC) 07/27/2017   GERD (gastroesophageal reflux disease)    H/O blood clots    arm and leg   HBP (high blood pressure)    Hematuria    gross   Hemorrhoids    HLD (hyperlipidemia)    Microscopic hematuria 06/02/2015   Muscle pain    Osteoarthritis    Osteoporosis    Panic disorder    Personal history of radiation therapy    PND (post-nasal drip)    CHRONIC WITH SORE THROAT AND SNEEZING   Reflux    Sepsis (HCC)    Sepsis due to pneumonia (HCC) 03/20/2023   Sepsis, unspecified organism (HCC) 12/15/2021   Formatting of this note might be different from the original.  Last Assessment & Plan: Formatting of this note might be different  from the original. Appears to be multifactorial and secondary to healthcare associated pneumonia as well as a UTI CT angio shows asymmetric airspace disease at the left base and posteriorly in the right upper lobe concerning for pneumonia Patient also has pyuria Patient   Severe sepsis (HCC) 12/15/2021   Skin cancer    nose   Swelling    Tremor    Tremors of nervous system    Ureteral stone with hydronephrosis 11/26/2018   Past Surgical History:  Procedure Laterality Date   APPENDECTOMY  1962   BREAST BIOPSY Right 2006?   benign   BREAST BIOPSY Left 2019   invasive mammary carcinoma   BREAST CYST EXCISION Left 07/27/2017   Procedure: SKIN CYST EXCISED;  Surgeon: Dessa Reyes ORN, MD;  Location: [facility] ORS;  Service: General;   Laterality: Left;   BREAST LUMPECTOMY Left 07/2017   invasive mammary, DCIS  mammosite   BREAST SURGERY Right 2019   CARPAL TUNNEL RELEASE Right    CATARACT EXTRACTION W/PHACO Left 11/11/2016   Procedure: CATARACT EXTRACTION PHACO AND INTRAOCULAR LENS PLACEMENT (IOC);  Surgeon: Jaye Fallow, MD;  Location: [facility] ORS;  Service: Ophthalmology;  Laterality: Left;  US  01:07.9 AP% 19.2 CDE 13.02 Fluid pack lot # [id number]   CATARACT EXTRACTION W/PHACO Right 12/09/2016   Procedure: CATARACT EXTRACTION PHACO AND INTRAOCULAR LENS PLACEMENT (IOC);  Surgeon: Jaye Fallow, MD;  Location: [facility] ORS;  Service: Ophthalmology;  Laterality: Right;  US  00:49 AP% 21.2 CDE 10.39 Fluid pack lot # [id number]   CHOLECYSTECTOMY     COLONOSCOPY WITH PROPOFOL  N/A 11/22/2014   Procedure: COLONOSCOPY WITH PROPOFOL ;  Surgeon: Lamar ONEIDA Holmes, MD;  Location: [facility] ENDOSCOPY;  Service: Endoscopy;  Laterality: N/A;   CYSTOSCOPY WITH STENT PLACEMENT Bilateral 11/27/2018   Procedure: CYSTOSCOPY WITH STENT PLACEMENT;  Surgeon: Nieves Cough, MD;  Location: [facility] ORS;  Service: Urology;  Laterality: Bilateral;   CYSTOSCOPY/URETEROSCOPY/HOLMIUM LASER/STENT PLACEMENT Bilateral 12/17/2018   Procedure: CYSTOSCOPY/URETEROSCOPY/HOLMIUM LASER/STENT Exchange;  Surgeon: Francisca Redell BROCKS, MD;  Location: [facility] ORS;  Service: Urology;  Laterality: Bilateral;   ESOPHAGOGASTRODUODENOSCOPY  11/22/2014   Procedure: ESOPHAGOGASTRODUODENOSCOPY (EGD);  Surgeon: Lamar ONEIDA Holmes, MD;  Location[facility] ENDOSCOPY;  Service: Endoscopy;;   ESOPHAGOGASTRODUODENOSCOPY (EGD) WITH PROPOFOL  N/A 03/05/2017   Procedure: ESOPHAGOGASTRODUODENOSCOPY (EGD) WITH PROPOFOL ;  Surgeon: Unk Corinn Skiff, MD;  Location: [facility] ENDOSCOPY;  Service: Gastroenterology;  Laterality: N/A;   EXTRACORPOREAL SHOCK WAVE LITHOTRIPSY Right 03/04/2018   Procedure: EXTRACORPOREAL SHOCK WAVE LITHOTRIPSY (ESWL);  Surgeon: Francisca Redell BROCKS, MD;  Location: [facility] ORS;  Service: Urology;   Laterality: Right;   EYE SURGERY Bilateral 2012   cataract extraction with iol   GALLBLADDER SURGERY  1968   hemi pelvectomy     left side at age 53   HEMIPELVIC Left 1962   LEG SURGERY Left    AMPUTATION d/t cancer at 89 years old   MASTECTOMY, PARTIAL Left 07/27/2017   Procedure: MASTECTOMY PARTIAL;  Surgeon: Dessa Reyes ORN, MD;  Location: [facility] ORS;  Service: General;  Laterality: Left;   MOUTH SURGERY  2019   teeth pulled with a bridge insertion.   fell out 12/16/18   OVARY SURGERY Right    cyst removed   SAVORY DILATION  11/22/2014   Procedure: SAVORY DILATION;  Surgeon: Lamar ONEIDA Holmes, MD;  Location: [facility] ENDOSCOPY;  Service: Endoscopy;;   SENTINEL NODE BIOPSY Left 07/27/2017   Procedure: SENTINEL NODE BIOPSY;  Surgeon: Dessa Reyes ORN, MD;  Location: [facility] ORS;  Service: General;  Laterality: Left;  SKIN CANCER EXCISION     nose and arm and face   TEE WITHOUT CARDIOVERSION N/A 07/25/2019   Procedure: TRANSESOPHAGEAL ECHOCARDIOGRAM (TEE);  Surgeon: Hester Wolm PARAS, MD;  Location: [facility] ORS;  Service: Cardiovascular;  Laterality: N/A;   TONSILLECTOMY      Allergies  Allergen Reactions   Ciprofloxacin  Hives   Codeine Other (See Comments)    HALLUCINATIONS   Tape     Rash and skin irritation/ paper tape and tegaderm OK   Ciprocinonide [Fluocinolone] Other (See Comments)    unknown   Amoxicillin Other (See Comments)    Upset stomach Has patient had a PCN reaction causing immediate rash, facial/tongue/throat swelling, SOB or lightheadedness with hypotension: No Has patient had a PCN reaction causing severe rash involving mucus membranes or skin necrosis: No Has patient had a PCN reaction that required hospitalization: No Has patient had a PCN reaction occurring within the last 10 years: Yes If all of the above answers are NO, then may proceed with Cephalosporin use.;   Cefuroxime  Other (See Comments)    OK INTRACAMERALLY PER DR WLP, upset stomach   Latex Rash     Rast test NEGATIVE   Lipitor [Atorvastatin] Other (See Comments)    unknown    Outpatient Encounter Medications as of 02/18/2024  Medication Sig   acetaminophen  (TYLENOL ) 650 MG CR tablet Take 650 mg by mouth every 8 (eight) hours as needed for pain.   Cholecalciferol  (VITAMIN D -1000 MAX ST) 25 MCG (1000 UT) tablet Take 1,000 Units by mouth daily.   clindamycin (CLEOCIN) 300 MG capsule Take 300 mg by mouth in the morning, at noon, in the evening, and at bedtime.   dextromethorphan-guaiFENesin  (ROBITUSSIN-DM) 10-100 MG/5ML liquid Take 10 mLs by mouth every 4 (four) hours as needed for cough.   diclofenac  Sodium (VOLTAREN ) 1 % GEL Apply 4 g topically. Apply to stump topically every 6 hours as needed. Apply to right knee topically three times daily for pain.   fluticasone  (FLONASE ) 50 MCG/ACT nasal spray Place 2 sprays into both nostrils 2 (two) times daily.   gabapentin  (NEURONTIN ) 100 MG capsule Take 100 mg by mouth 3 (three) times daily.   GLIPIZIDE  XL 2.5 MG 24 hr tablet Take 2.5 mg by mouth daily.    hydrocortisone 2.5 % cream Apply to Hemorrhoids/buttucks topically every 8 hours as needed for hemorrhoids.   ipratropium-albuterol  (DUONEB) 0.5-2.5 (3) MG/3ML SOLN Take 3 mLs by nebulization every 4 (four) hours as needed.   loratadine  (CLARITIN ) 10 MG tablet Take 10 mg by mouth daily.   melatonin 3 MG TABS tablet Take 3 mg by mouth at bedtime.   metFORMIN  (GLUCOPHAGE ) 1000 MG tablet Take 1,000 mg by mouth 2 (two) times daily.   metoprolol  tartrate (LOPRESSOR ) 25 MG tablet Take 25 mg by mouth 2 (two) times daily.   metroNIDAZOLE  (METROGEL ) 1 % gel Apply topically daily.   ondansetron  (ZOFRAN ) 4 MG tablet Take 4 mg by mouth every 4 (four) hours as needed for nausea or vomiting.   OXYGEN 2lpm as needed for sats less than 88% every 1 hour as needed   pantoprazole  (PROTONIX ) 40 MG tablet Take 40 mg by mouth daily.   PARoxetine  (PAXIL ) 30 MG tablet Take 30 mg by mouth daily.   Polyethyl  Glycol-Propyl Glycol 0.4-0.3 % SOLN Apply to eye 2 (two) times daily as needed.   polyethylene glycol (MIRALAX  / GLYCOLAX ) 17 g packet Take 17 g by mouth daily as needed.   PRADAXA  150 MG CAPS  capsule TAKE 1 CAPSULE BY MOUTH TWICE DAILY START TREATMENT 10 HRS LAST DOSE OF LOVENOX    sitaGLIPtin (JANUVIA) 100 MG tablet Take 100 mg by mouth daily.   Sodium Fluoride (PREVIDENT 5000 BOOSTER PLUS) 1.1 % PSTE Place onto teeth at bedtime.   SUNSCREEN SPF50 EX Apply topically. Apply to exposed skin topically every 2 hours as needed for Sunburn Prevention   tiZANidine  (ZANAFLEX ) 2 MG tablet Take 2 mg by mouth every 12 (twelve) hours as needed for muscle spasms.   LORazepam  (ATIVAN ) 1 MG tablet Take by mouth. (Patient not taking: Reported on 02/18/2024)   mupirocin ointment (BACTROBAN) 2 % Apply 1 Application topically 3 (three) times daily. (Patient not taking: Reported on 02/18/2024)   pantoprazole  (PROTONIX ) 40 MG tablet Take 40 mg by mouth daily. (Patient not taking: Reported on 02/18/2024)   No facility-administered encounter medications on file as of 02/18/2024.    Review of Systems  Constitutional:  Positive for fever. Negative for chills and fatigue.  HENT:  Positive for sinus pressure and sore throat. Negative for tinnitus.   Respiratory:  Positive for cough and shortness of breath. Negative for wheezing.   Cardiovascular:  Negative for chest pain, palpitations and leg swelling.  Gastrointestinal:  Negative for abdominal pain, constipation and diarrhea.  Skin: Negative.   Neurological:  Negative for dizziness and headaches.    Immunization History  Administered Date(s) Administered   INFLUENZA, HIGH DOSE SEASONAL PF 03/07/2023   Influenza-Unspecified 02/03/2020, 02/18/2021, 02/18/2022, 02/16/2024   Moderna Covid-19 Fall Seasonal Vaccine 75yrs & older 08/12/2022   PNEUMOCOCCAL CONJUGATE-20 01/17/2023   Pneumococcal Conjugate-13 09/28/2017   Tdap 12/31/2022   Unspecified SARS-COV-2  Vaccination 05/20/2019, 05/20/2019, 06/17/2019, 03/16/2020, 09/20/2020, 01/25/2021, 01/30/2023, 03/07/2023   Zoster Recombinant(Shingrix) 12/25/2023   Pertinent  Health Maintenance Due  Topic Date Due   Mammogram  04/13/2024   HEMOGLOBIN A1C  03/10/2024   OPHTHALMOLOGY EXAM  11/11/2024   FOOT EXAM  12/24/2024   Influenza Vaccine  Completed   DEXA SCAN  Completed      12/19/2021    7:00 AM 12/19/2021   12:00 PM 01/24/2022   11:20 AM 12/11/2022    1:31 PM 12/24/2023   11:58 AM  Fall Risk  Falls in the past year?    0 0  (RETIRED) Patient Fall Risk Level High fall risk  High fall risk  High fall risk     Patient at Risk for Falls Due to     Impaired balance/gait;Impaired mobility     Data saved with a previous flowsheet row definition   Functional Status Survey:    Vitals:   02/18/24 1502  BP: 139/74  Pulse: 75  Resp: 18  Temp: (!) 97 F (36.1 C)  SpO2: 90%  Weight: 149 lb 9.6 oz (67.9 kg)  Height: 4' 10 (1.473 m)   Body mass index is 31.27 kg/m. Physical Exam Constitutional:      General: She is not in acute distress.    Appearance: She is well-developed. She is not diaphoretic.  HENT:     Head: Normocephalic and atraumatic.     Mouth/Throat:     Pharynx: No oropharyngeal exudate.  Eyes:     Conjunctiva/sclera: Conjunctivae normal.     Pupils: Pupils are equal, round, and reactive to light.  Cardiovascular:     Rate and Rhythm: Normal rate and regular rhythm.     Heart sounds: Normal heart sounds.  Pulmonary:     Effort: Pulmonary effort is normal.  Breath sounds: Normal breath sounds.  Abdominal:     General: Bowel sounds are normal.     Palpations: Abdomen is soft.  Musculoskeletal:        General: No tenderness.     Cervical back: Normal range of motion and neck supple.  Skin:    General: Skin is warm and dry.  Neurological:     Mental Status: She is alert and oriented to person, place, and time.    Labs reviewed: Recent Labs    03/28/23 0426  03/29/23 0414 03/30/23 0642 04/06/23 0000 08/04/23 1011 09/03/23 1116 02/03/24 1015  NA 138 138 137   < > 137 138 136  K 4.2 4.4 5.5*   < > 4.0 4.1 4.9  CL 101 103 103   < > 102 104 105  CO2 29 27 25    < > 25 24 25   GLUCOSE 177* 173* 202*  --  191* 175* 171*  BUN 11 10 8    < > 14 16 17   CREATININE 0.49 0.50 0.62   < > 0.71 0.62 0.59  CALCIUM 8.7* 9.1 9.3   < > 11.0* 10.2 11.6*  MG 1.5* 1.9 1.7  --   --   --   --   PHOS 2.5 1.9* 2.4*  --   --   --   --    < > = values in this interval not displayed.   Recent Labs    03/21/23 0502 03/22/23 0500 03/23/23 0507  AST 18 15 22   ALT 10 12 14   ALKPHOS 45 46 53  BILITOT 0.4 <0.2 0.4  PROT 5.7* 5.9* 6.3*  ALBUMIN 2.8* 2.7* 2.8*   Recent Labs    03/29/23 0414 04/06/23 0000 08/04/23 1011 02/03/24 1015  WBC 10.1 10.3 10.5 9.5  NEUTROABS  --  7,025.00 7.2 6.3  HGB 10.9* 11.3* 12.7 12.5  HCT 35.1* 36 41.4 40.4  MCV 92.9  --  85.4 79.8*  PLT 312 264 241 241   Lab Results  Component Value Date   TSH 3.77 04/16/2023   Lab Results  Component Value Date   HGBA1C 7.6 09/08/2023   Lab Results  Component Value Date   CHOL 110 12/15/2021   HDL 19 (L) 12/15/2021   LDLCALC 46 12/15/2021   TRIG 223 (H) 12/15/2021   CHOLHDL 5.8 12/15/2021    Significant Diagnostic Results in last 30 days:  VAS US  ABI WITH/WO TBI Result Date: 01/25/2024  LOWER EXTREMITY DOPPLER STUDY Patient Name:  CAMELLA SEIM  Date of Exam:   01/22/2024 Medical Rec #: [id number]        Accession #:    [id number] Date of Birth: 10-05-1933       Patient Gender: F Patient Age:   11 years Exam Location:  [facility] Procedure:      VAS US  ABI WITH/WO TBI Referring Phys: --------------------------------------------------------------------------------  Indications: Peripheral artery disease. High Risk Factors: Hypertension, hyperlipidemia, Diabetes, no history of                    smoking, prior MI. Other Factors: Prior left leg amputation at age 26 for  cancer                 Right foot redness.  Limitations: Today's exam was limited due to patient in wheelchair and              involuntary patient movement. Performing Technologist: Donnice Charnley RVT  Examination Guidelines: A  complete evaluation includes at minimum, Doppler waveform signals and systolic blood pressure reading at the level of bilateral brachial, anterior tibial, and posterior tibial arteries, when vessel segments are accessible. Bilateral testing is considered an integral part of a complete examination. Photoelectric Plethysmograph (PPG) waveforms and toe systolic pressure readings are included as required and additional duplex testing as needed. Limited examinations for reoccurring indications may be performed as noted.  ABI Findings: +---------+------------------+-----+---------+--------+ Right    Rt Pressure (mmHg)IndexWaveform Comment  +---------+------------------+-----+---------+--------+ Brachial 137                                      +---------+------------------+-----+---------+--------+ PTA      167               1.22 triphasic         +---------+------------------+-----+---------+--------+ DP       170               1.24 triphasic         +---------+------------------+-----+---------+--------+ Great Toe93                0.68                   +---------+------------------+-----+---------+--------+ +--------+------------------+-----+--------+-------+ Left    Lt Pressure (mmHg)IndexWaveformComment +--------+------------------+-----+--------+-------+ [id number]                                    +--------+------------------+-----+--------+-------+ +-------+-----------+-----------+------------+------------+ ABI/TBIToday's ABIToday's TBIPrevious ABIPrevious TBI +-------+-----------+-----------+------------+------------+ Right  1.24       0.68       1.16        0.80         +-------+-----------+-----------+------------+------------+  Left   Amputation            Amputation               +-------+-----------+-----------+------------+------------+  Right ABIs appear essentially unchanged compared to prior study on 01/22/2023.  Summary: Right: Resting right ankle-brachial index is within normal range. The right toe-brachial index is abnormal.  *See table(s) above for measurements and observations.  Electronically signed by Selinda Gu MD on 01/25/2024 at 10:38:27 AM.    Final     Assessment/Plan 1. Upper respiratory tract infection, unspecified type (Primary) Will start mucinex  DM by mouth twice daily with full glass of water To get chest xray  Cbc with diff and bmp  Increase fluids VS q shift.  notify with changes  Harlene POUR. Caro BODILY [facility] [phone]

## 2024-02-29 ENCOUNTER — Encounter: Payer: Self-pay | Admitting: Orthopedic Surgery

## 2024-02-29 ENCOUNTER — Non-Acute Institutional Stay (SKILLED_NURSING_FACILITY): Admitting: Orthopedic Surgery

## 2024-02-29 DIAGNOSIS — E1142 Type 2 diabetes mellitus with diabetic polyneuropathy: Secondary | ICD-10-CM

## 2024-02-29 DIAGNOSIS — I480 Paroxysmal atrial fibrillation: Secondary | ICD-10-CM | POA: Diagnosis not present

## 2024-02-29 DIAGNOSIS — R319 Hematuria, unspecified: Secondary | ICD-10-CM

## 2024-02-29 DIAGNOSIS — S88912S Complete traumatic amputation of left lower leg, level unspecified, sequela: Secondary | ICD-10-CM

## 2024-02-29 LAB — COMPREHENSIVE METABOLIC PANEL WITH GFR
Albumin: 3.4 — AB (ref 3.5–5.0)
Calcium: 10.4 (ref 8.7–10.7)
Globulin: 2.3
eGFR: 95

## 2024-02-29 LAB — HEPATIC FUNCTION PANEL
ALT: 13 U/L (ref 7–35)
AST: 16 (ref 13–35)
Alkaline Phosphatase: 63 (ref 25–125)
Bilirubin, Total: 0.4

## 2024-02-29 LAB — BASIC METABOLIC PANEL WITH GFR
BUN: 12 (ref 4–21)
CO2: 26 — AB (ref 13–22)
Chloride: 107 (ref 99–108)
Creatinine: 0.4 — AB (ref 0.5–1.1)
Glucose: 81
Potassium: 4.3 meq/L (ref 3.5–5.1)
Sodium: 139 (ref 137–147)

## 2024-02-29 LAB — CBC AND DIFFERENTIAL
HCT: 36 (ref 36–46)
Hemoglobin: 11.3 — AB (ref 12.0–16.0)
Neutrophils Absolute: 6740
Platelets: 212 K/uL (ref 150–400)
WBC: 10

## 2024-02-29 LAB — CBC: RBC: 4.48 (ref 3.87–5.11)

## 2024-02-29 NOTE — Progress Notes (Signed)
 Location:  Other Nursing Home Room Number: 215/A Place of Service:  SNF (31) Provider:  Greig FORBES Cluster, NP   Laurence Locus, DO  Patient Care Team: Laurence Locus, DO as PCP - General (Internal Medicine) Julie Acosta ORN, MD (General Surgery) Jacobo Evalene PARAS, MD as Consulting Physician (Oncology) [facility], [facility])  Extended Emergency Contact Information Primary Emergency Contact: Kelsie Maryruth FALCON Address: [address]          [place], [place] [address] [place] Home Phone: [phone] Mobile Phone: [phone] Relation: Daughter  Code Status:  DNR Goals of care: Advanced Directive information    02/03/2024   10:48 AM  Advanced Directives  Does Patient Have a Medical Advance Directive? Yes  Type of Advance Directive Out of facility DNR (pink MOST or yellow form)  Copy of Healthcare Power of Attorney in Chart? Yes - validated most recent copy scanned in chart (See row information)  Pre-existing out of facility DNR order (yellow form or pink MOST form) Pink MOST/Yellow Form most recent copy in chart - Physician notified to receive inpatient order     Chief Complaint  Patient presents with   Acute Visit    hematuria    HPI:  Pt is a 89 y.o. female seen today for acute visit due to hematuria.   She currently resides on the skilled nursing unit at [facility]. PMH: PAD, PAF, HTN, MI, constipation, hyperthyroidism, hyperparathyroidism, T2DM, osteoporosis, bone cancer left leg at age 71 with amputation, DCIS left breast cancer s/p radiation and depression.   10/26 nursing reports large amount of maroon colored urine with bathroom assistance. No hematuria reported today. On call held pradaxa  10/26 and 10/27. Cbc/diff and UA pending. She denies dysuria but admits to hemorrhoids and loose stools. No black tarry stools per nursing. Afebrile. Vitals stable.    Past Medical History:  Diagnosis Date   Anxiety    Benign breast lumps    Blood clotting  disorder    Bone cancer (HCC)    head of femur, left leg ... 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  Oncotype recurrence score: 1.  DCIS, margin less than 0.5 mm.  Negative sentinel node.  Accelerated partial breast radiation.   Cervicalgia    Chronic kidney disease    kidney stones   Colon cancer [facility])    patient unaware of this   Complication of anesthesia    mood alteration / not sure if d/t pain medicine or anesthesia as she passed out    Cough    NASAL DRIP / SNEEZING / SORE THROAT MOSTLY CONSTANT   Depression    Diabetes (HCC)    Diverticulitis    Dizzy    DVT (deep venous thrombosis) (HCC) 07/27/2017   GERD (gastroesophageal reflux disease)    H/O blood clots    arm and leg   HBP (high blood pressure)    Hematuria    gross   Hemorrhoids    HLD (hyperlipidemia)    Microscopic hematuria 06/02/2015   Muscle pain    Osteoarthritis    Osteoporosis    Panic disorder    Personal history of radiation therapy    PND (post-nasal drip)    CHRONIC WITH SORE THROAT AND SNEEZING   Reflux    Sepsis (HCC)    Sepsis due to pneumonia (HCC) 03/20/2023  Sepsis, unspecified organism (HCC) 12/15/2021   Formatting of this note might be different from the original.  Last Assessment & Plan: Formatting of this note might be different from the original. Appears to be multifactorial and secondary to healthcare associated pneumonia as well as a UTI CT angio shows asymmetric airspace disease at the left base and posteriorly in the right upper lobe concerning for pneumonia Patient also has pyuria Patient   Severe sepsis (HCC) 12/15/2021   Skin cancer    nose   Swelling    Tremor    Tremors of nervous system    Ureteral stone with hydronephrosis 11/26/2018   Past Surgical History:  Procedure Laterality Date   APPENDECTOMY  1962   BREAST BIOPSY Right 2006?    benign   BREAST BIOPSY Left 2019   invasive mammary carcinoma   BREAST CYST EXCISION Left 07/27/2017   Procedure: SKIN CYST EXCISED;  Surgeon: Julie Acosta ORN, MD;  Location: [facility] ORS;  Service: General;  Laterality: Left;   BREAST LUMPECTOMY Left 07/2017   invasive mammary, DCIS  mammosite   BREAST SURGERY Right 2019   CARPAL TUNNEL RELEASE Right    CATARACT EXTRACTION W/PHACO Left 11/11/2016   Procedure: CATARACT EXTRACTION PHACO AND INTRAOCULAR LENS PLACEMENT (IOC);  Surgeon: Jaye Fallow, MD;  Location: [facility] ORS;  Service: Ophthalmology;  Laterality: Left;  US  01:07.9 AP% 19.2 CDE 13.02 Fluid pack lot # [id number]   CATARACT EXTRACTION W/PHACO Right 12/09/2016   Procedure: CATARACT EXTRACTION PHACO AND INTRAOCULAR LENS PLACEMENT (IOC);  Surgeon: Jaye Fallow, MD;  Location: [facility] ORS;  Service: Ophthalmology;  Laterality: Right;  US  00:49 AP% 21.2 CDE 10.39 Fluid pack lot # [id number]   CHOLECYSTECTOMY     COLONOSCOPY WITH PROPOFOL  N/A 11/22/2014   Procedure: COLONOSCOPY WITH PROPOFOL ;  Surgeon: Lamar ONEIDA Holmes, MD;  Location[facility] ENDOSCOPY;  Service: Endoscopy;  Laterality: N/A;   CYSTOSCOPY WITH STENT PLACEMENT Bilateral 11/27/2018   Procedure: CYSTOSCOPY WITH STENT PLACEMENT;  Surgeon: Nieves Cough, MD;  Location: [facility] ORS;  Service: Urology;  Laterality: Bilateral;   CYSTOSCOPY/URETEROSCOPY/HOLMIUM LASER/STENT PLACEMENT Bilateral 12/17/2018   Procedure: CYSTOSCOPY/URETEROSCOPY/HOLMIUM LASER/STENT Exchange;  Surgeon: Francisca Redell BROCKS, MD;  Location: [facility] ORS;  Service: Urology;  Laterality: Bilateral;   ESOPHAGOGASTRODUODENOSCOPY  11/22/2014   Procedure: ESOPHAGOGASTRODUODENOSCOPY (EGD);  Surgeon: Lamar ONEIDA Holmes, MD;  Location: [facility] ENDOSCOPY;  Service: Endoscopy;;   ESOPHAGOGASTRODUODENOSCOPY (EGD) WITH PROPOFOL  N/A 03/05/2017   Procedure: ESOPHAGOGASTRODUODENOSCOPY (EGD) WITH PROPOFOL ;  Surgeon: Unk Corinn Skiff, MD;  Location: [facility] ENDOSCOPY;  Service:  Gastroenterology;  Laterality: N/A;   EXTRACORPOREAL SHOCK WAVE LITHOTRIPSY Right 03/04/2018   Procedure: EXTRACORPOREAL SHOCK WAVE LITHOTRIPSY (ESWL);  Surgeon: Francisca Redell BROCKS, MD;  Location: [facility] ORS;  Service: Urology;  Laterality: Right;   EYE SURGERY Bilateral 2012   cataract extraction with iol   GALLBLADDER SURGERY  1968   hemi pelvectomy     left side at age 7   HEMIPELVIC Left 1962   LEG SURGERY Left    AMPUTATION d/t cancer at 89 years old   MASTECTOMY, PARTIAL Left 07/27/2017   Procedure: MASTECTOMY PARTIAL;  Surgeon: Julie Acosta ORN, MD;  Location: [facility] ORS;  Service: General;  Laterality: Left;   MOUTH SURGERY  2019   teeth pulled with a bridge insertion.   fell out 12/16/18   OVARY SURGERY Right    cyst removed   SAVORY DILATION  11/22/2014   Procedure: SAVORY DILATION;  Surgeon: Lamar ONEIDA Holmes, MD;  Location: [facility] ENDOSCOPY;  Service: Endoscopy;;  SENTINEL NODE BIOPSY Left 07/27/2017   Procedure: SENTINEL NODE BIOPSY;  Surgeon: Julie Acosta ORN, MD;  Location: [facility] ORS;  Service: General;  Laterality: Left;   SKIN CANCER EXCISION     nose and arm and face   TEE WITHOUT CARDIOVERSION N/A 07/25/2019   Procedure: TRANSESOPHAGEAL ECHOCARDIOGRAM (TEE);  Surgeon: Hester Wolm PARAS, MD;  Location: [facility] ORS;  Service: Cardiovascular;  Laterality: N/A;   TONSILLECTOMY      Allergies  Allergen Reactions   Ciprofloxacin  Hives   Codeine Other (See Comments)    HALLUCINATIONS   Tape     Rash and skin irritation/ paper tape and tegaderm OK   Ciprocinonide [Fluocinolone] Other (See Comments)    unknown   Amoxicillin Other (See Comments)    Upset stomach Has patient had a PCN reaction causing immediate rash, facial/tongue/throat swelling, SOB or lightheadedness with hypotension: No Has patient had a PCN reaction causing severe rash involving mucus membranes or skin necrosis: No Has patient had a PCN reaction that required hospitalization: No Has patient had a PCN  reaction occurring within the last 10 years: Yes If all of the above answers are NO, then may proceed with Cephalosporin use.;   Cefuroxime  Other (See Comments)    OK INTRACAMERALLY PER DR WLP, upset stomach   Latex Rash    Rast test NEGATIVE   Lipitor [Atorvastatin] Other (See Comments)    unknown    Outpatient Encounter Medications as of 02/29/2024  Medication Sig   acetaminophen  (TYLENOL ) 650 MG CR tablet Take 650 mg by mouth every 8 (eight) hours as needed for pain.   Cholecalciferol  (VITAMIN D -1000 MAX ST) 25 MCG (1000 UT) tablet Take 1,000 Units by mouth daily.   clindamycin (CLEOCIN) 300 MG capsule Take 300 mg by mouth in the morning, at noon, in the evening, and at bedtime.   dextromethorphan-guaiFENesin  (ROBITUSSIN-DM) 10-100 MG/5ML liquid Take 10 mLs by mouth every 4 (four) hours as needed for cough.   diclofenac  Sodium (VOLTAREN ) 1 % GEL Apply 4 g topically. Apply to stump topically every 6 hours as needed. Apply to right knee topically three times daily for pain.   fluticasone  (FLONASE ) 50 MCG/ACT nasal spray Place 2 sprays into both nostrils 2 (two) times daily.   gabapentin  (NEURONTIN ) 100 MG capsule Take 100 mg by mouth 3 (three) times daily.   GLIPIZIDE  XL 2.5 MG 24 hr tablet Take 2.5 mg by mouth daily.    hydrocortisone 2.5 % cream Apply to Hemorrhoids/buttucks topically every 8 hours as needed for hemorrhoids.   ipratropium-albuterol  (DUONEB) 0.5-2.5 (3) MG/3ML SOLN Take 3 mLs by nebulization every 4 (four) hours as needed.   loratadine  (CLARITIN ) 10 MG tablet Take 10 mg by mouth daily.   LORazepam  (ATIVAN ) 1 MG tablet Take by mouth. (Patient not taking: Reported on 02/18/2024)   melatonin 3 MG TABS tablet Take 3 mg by mouth at bedtime.   metFORMIN  (GLUCOPHAGE ) 1000 MG tablet Take 1,000 mg by mouth 2 (two) times daily.   metoprolol  tartrate (LOPRESSOR ) 25 MG tablet Take 25 mg by mouth 2 (two) times daily.   metroNIDAZOLE  (METROGEL ) 1 % gel Apply topically daily.    mupirocin ointment (BACTROBAN) 2 % Apply 1 Application topically 3 (three) times daily. (Patient not taking: Reported on 02/18/2024)   ondansetron  (ZOFRAN ) 4 MG tablet Take 4 mg by mouth every 4 (four) hours as needed for nausea or vomiting.   OXYGEN 2lpm as needed for sats less than 88% every 1 hour as needed  pantoprazole  (PROTONIX ) 40 MG tablet Take 40 mg by mouth daily. (Patient not taking: Reported on 02/18/2024)   pantoprazole  (PROTONIX ) 40 MG tablet Take 40 mg by mouth daily.   PARoxetine  (PAXIL ) 30 MG tablet Take 30 mg by mouth daily.   Polyethyl Glycol-Propyl Glycol 0.4-0.3 % SOLN Apply to eye 2 (two) times daily as needed.   polyethylene glycol (MIRALAX  / GLYCOLAX ) 17 g packet Take 17 g by mouth daily as needed.   PRADAXA  150 MG CAPS capsule TAKE 1 CAPSULE BY MOUTH TWICE DAILY START TREATMENT 10 HRS LAST DOSE OF LOVENOX    sitaGLIPtin (JANUVIA) 100 MG tablet Take 100 mg by mouth daily.   Sodium Fluoride (PREVIDENT 5000 BOOSTER PLUS) 1.1 % PSTE Place onto teeth at bedtime.   SUNSCREEN SPF50 EX Apply topically. Apply to exposed skin topically every 2 hours as needed for Sunburn Prevention   tiZANidine  (ZANAFLEX ) 2 MG tablet Take 2 mg by mouth every 12 (twelve) hours as needed for muscle spasms.   No facility-administered encounter medications on file as of 02/29/2024.    Review of Systems  Constitutional:  Negative for fatigue and fever.  Respiratory:  Negative for cough and shortness of breath.   Cardiovascular:  Negative for chest pain and leg swelling.  Gastrointestinal:  Positive for constipation and diarrhea. Negative for abdominal distention, abdominal pain, anal bleeding and blood in stool.  Genitourinary:  Positive for hematuria. Negative for dysuria, pelvic pain and vaginal bleeding.  Musculoskeletal:  Positive for gait problem.  Neurological:  Positive for weakness. Negative for dizziness and light-headedness.  Psychiatric/Behavioral:  Negative for confusion and dysphoric  mood. The patient is not nervous/anxious.     Immunization History  Administered Date(s) Administered   INFLUENZA, HIGH DOSE SEASONAL PF 03/07/2023   Influenza-Unspecified 02/03/2020, 02/18/2021, 02/18/2022, 02/16/2024   Moderna Covid-19 Fall Seasonal Vaccine 40yrs & older 08/12/2022   PNEUMOCOCCAL CONJUGATE-20 01/17/2023   Pneumococcal Conjugate-13 09/28/2017   Tdap 12/31/2022   Unspecified SARS-COV-2 Vaccination 05/20/2019, 05/20/2019, 06/17/2019, 03/16/2020, 09/20/2020, 01/25/2021, 01/30/2023, 03/07/2023   Zoster Recombinant(Shingrix) 12/25/2023   Pertinent  Health Maintenance Due  Topic Date Due   Mammogram  04/13/2024   HEMOGLOBIN A1C  03/10/2024   OPHTHALMOLOGY EXAM  11/11/2024   FOOT EXAM  12/24/2024   Influenza Vaccine  Completed   DEXA SCAN  Completed      12/19/2021    7:00 AM 12/19/2021   12:00 PM 01/24/2022   11:20 AM 12/11/2022    1:31 PM 12/24/2023   11:58 AM  Fall Risk  Falls in the past year?    0 0  (RETIRED) Patient Fall Risk Level High fall risk  High fall risk  High fall risk     Patient at Risk for Falls Due to     Impaired balance/gait;Impaired mobility     Data saved with a previous flowsheet row definition   Functional Status Survey:    Vitals:   02/29/24 1254  BP: 122/64  Pulse: 69  Resp: 17  Temp: (!) 97.4 F (36.3 C)  SpO2: 92%  Weight: 145 lb 9.6 oz (66 kg)  Height: 4' 10 (1.473 m)   Body mass index is 30.43 kg/m. Physical Exam Vitals reviewed.  Constitutional:      General: She is not in acute distress.    Appearance: She is not ill-appearing.  HENT:     Head: Normocephalic.  Eyes:     General:        Right eye: No discharge.  Left eye: No discharge.  Cardiovascular:     Rate and Rhythm: Normal rate and regular rhythm.     Pulses: Normal pulses.     Heart sounds: Normal heart sounds.  Pulmonary:     Effort: Pulmonary effort is normal.     Breath sounds: Normal breath sounds.  Abdominal:     General: Bowel sounds are  normal.     Palpations: Abdomen is soft.  Musculoskeletal:     Cervical back: Neck supple.     Right lower leg: No edema.     Left lower leg: No edema.  Skin:    General: Skin is warm.     Capillary Refill: Capillary refill takes less than 2 seconds.  Neurological:     General: No focal deficit present.     Mental Status: She is alert. Mental status is at baseline.     Gait: Gait abnormal.  Psychiatric:        Mood and Affect: Mood normal.     Labs reviewed: Recent Labs    03/28/23 0426 03/29/23 0414 03/30/23 0642 04/06/23 0000 08/04/23 1011 09/03/23 1116 02/03/24 1015  NA 138 138 137   < > 137 138 136  K 4.2 4.4 5.5*   < > 4.0 4.1 4.9  CL 101 103 103   < > 102 104 105  CO2 29 27 25    < > 25 24 25   GLUCOSE 177* 173* 202*  --  191* 175* 171*  BUN 11 10 8    < > 14 16 17   CREATININE 0.49 0.50 0.62   < > 0.71 0.62 0.59  CALCIUM 8.7* 9.1 9.3   < > 11.0* 10.2 11.6*  MG 1.5* 1.9 1.7  --   --   --   --   PHOS 2.5 1.9* 2.4*  --   --   --   --    < > = values in this interval not displayed.   Recent Labs    03/21/23 0502 03/22/23 0500 03/23/23 0507  AST 18 15 22   ALT 10 12 14   ALKPHOS 45 46 53  BILITOT 0.4 <0.2 0.4  PROT 5.7* 5.9* 6.3*  ALBUMIN 2.8* 2.7* 2.8*   Recent Labs    03/29/23 0414 04/06/23 0000 08/04/23 1011 02/03/24 1015  WBC 10.1 10.3 10.5 9.5  NEUTROABS  --  7,025.00 7.2 6.3  HGB 10.9* 11.3* 12.7 12.5  HCT 35.1* 36 41.4 40.4  MCV 92.9  --  85.4 79.8*  PLT 312 264 241 241   Lab Results  Component Value Date   TSH 3.77 04/16/2023   Lab Results  Component Value Date   HGBA1C 7.6 09/08/2023   Lab Results  Component Value Date   CHOL 110 12/15/2021   HDL 19 (L) 12/15/2021   LDLCALC 46 12/15/2021   TRIG 223 (H) 12/15/2021   CHOLHDL 5.8 12/15/2021    Significant Diagnostic Results in last 30 days:  No results found.  Assessment/Plan 1. Hematuria, unspecified type (Primary) - noted 10/26 - no episodes noted today - on pantoprazole  -  appears asymptomatic, no black tarry stools, admits to hemorrhoids - cbc/diff, UA pending - if no hematuria 10/28 resume Pradaxa   2. 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 Paroxysmal atrial fibrillation (HCC) - HR< 100 with metoprolol  - see above  4. Type 2 diabetes mellitus with peripheral neuropathy (HCC) - A1c 7.6 09/2023 -  no hypoglycemias - 09/09 Januvia started - cont glipizide  and gabapentin     Family/ staff Communication: plan discussed with patient and nurse  Labs/tests ordered:  none

## 2024-03-07 LAB — LIPID PANEL
Cholesterol: 146 (ref 0–200)
HDL: 29 — AB (ref 35–70)
LDL Cholesterol: 81
Triglycerides: 270 — AB (ref 40–160)

## 2024-03-08 ENCOUNTER — Non-Acute Institutional Stay (SKILLED_NURSING_FACILITY): Payer: Self-pay | Admitting: Nurse Practitioner

## 2024-03-08 ENCOUNTER — Encounter: Payer: Self-pay | Admitting: Nurse Practitioner

## 2024-03-08 DIAGNOSIS — Z5181 Encounter for therapeutic drug level monitoring: Secondary | ICD-10-CM | POA: Diagnosis not present

## 2024-03-08 DIAGNOSIS — K219 Gastro-esophageal reflux disease without esophagitis: Secondary | ICD-10-CM

## 2024-03-08 DIAGNOSIS — R1310 Dysphagia, unspecified: Secondary | ICD-10-CM | POA: Diagnosis not present

## 2024-03-08 DIAGNOSIS — Z7984 Long term (current) use of oral hypoglycemic drugs: Secondary | ICD-10-CM

## 2024-03-08 DIAGNOSIS — E1142 Type 2 diabetes mellitus with diabetic polyneuropathy: Secondary | ICD-10-CM | POA: Diagnosis not present

## 2024-03-08 DIAGNOSIS — E785 Hyperlipidemia, unspecified: Secondary | ICD-10-CM

## 2024-03-08 DIAGNOSIS — K029 Dental caries, unspecified: Secondary | ICD-10-CM

## 2024-03-08 DIAGNOSIS — E669 Obesity, unspecified: Secondary | ICD-10-CM

## 2024-03-08 DIAGNOSIS — M1711 Unilateral primary osteoarthritis, right knee: Secondary | ICD-10-CM

## 2024-03-08 DIAGNOSIS — F3341 Major depressive disorder, recurrent, in partial remission: Secondary | ICD-10-CM

## 2024-03-08 DIAGNOSIS — D6851 Activated protein C resistance: Secondary | ICD-10-CM

## 2024-03-08 DIAGNOSIS — E0590 Thyrotoxicosis, unspecified without thyrotoxic crisis or storm: Secondary | ICD-10-CM

## 2024-03-08 DIAGNOSIS — K529 Noninfective gastroenteritis and colitis, unspecified: Secondary | ICD-10-CM

## 2024-03-08 DIAGNOSIS — I10 Essential (primary) hypertension: Secondary | ICD-10-CM

## 2024-03-08 DIAGNOSIS — I480 Paroxysmal atrial fibrillation: Secondary | ICD-10-CM

## 2024-03-08 NOTE — Progress Notes (Unsigned)
 Location:   [facility] of Service:  [place] [address] [place] [address]  Harlene An, NP  PCP: Laurence Locus, DO  Patient Care Team: Laurence Locus, DO as PCP - General (Internal Medicine) Dessa, Reyes ORN, MD (General Surgery) Jacobo Evalene PARAS, MD as Consulting Physician (Oncology) [facility], [facility])  Extended Emergency Contact Information Primary Emergency Contact: Kelsie Maryruth FALCON Address: [address]          [place], [place] [address] [place] Home Phone: [phone] Mobile Phone: [phone] Relation: Daughter  Goals of care: Advanced Directive information    02/03/2024   10:48 AM  Advanced Directives  Does Patient Have a Medical Advance Directive? Yes  Type of Advance Directive Out of facility DNR (pink MOST or yellow form)  Copy of Healthcare Power of Attorney in Chart? Yes - validated most recent copy scanned in chart (See row information)  Pre-existing out of facility DNR order (yellow form or pink MOST form) Pink MOST/Yellow Form most recent copy in chart - Physician notified to receive inpatient order     Chief Complaint  Patient presents with   Medical Management of Chronic Issues    Medical Management of Chronic Issues.     HPI:  Pt is a 89 y.o. female seen today for medical management of chronic disease.    Past Medical History:  Diagnosis Date   Anxiety    Benign breast lumps    Blood clotting disorder    Bone cancer (HCC)    head of femur, left leg ... 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  Oncotype recurrence score: 1.  DCIS, margin less than 0.5 mm.  Negative sentinel node.  Accelerated partial breast radiation.   Cervicalgia    Chronic kidney disease    kidney stones   Colon cancer [facility])    patient unaware of this   Complication of anesthesia    mood alteration / not sure if  d/t pain medicine or anesthesia as she passed out    Cough    NASAL DRIP / SNEEZING / SORE THROAT MOSTLY CONSTANT   Depression    Diabetes (HCC)    Diverticulitis    Dizzy    DVT (deep venous thrombosis) (HCC) 07/27/2017   GERD (gastroesophageal reflux disease)    H/O blood clots    arm and leg   HBP (high blood pressure)    Hematuria    gross   Hemorrhoids    HLD (hyperlipidemia)    Microscopic hematuria 06/02/2015   Muscle pain    Osteoarthritis    Osteoporosis    Panic disorder    Personal history of radiation therapy    PND (post-nasal drip)    CHRONIC WITH SORE THROAT AND SNEEZING   Reflux    Sepsis (HCC)    Sepsis due to pneumonia (HCC) 03/20/2023   Sepsis, unspecified organism (HCC) 12/15/2021   Formatting of this note might be different from the original.  Last Assessment & Plan: Formatting of this note might be different from the original. Appears to be multifactorial and secondary to healthcare associated pneumonia as well as a UTI CT angio shows asymmetric airspace disease at the left base and posteriorly in the right upper lobe concerning for pneumonia Patient also has pyuria Patient   Severe sepsis (HCC) 12/15/2021   Skin cancer  nose   Swelling    Tremor    Tremors of nervous system    Ureteral stone with hydronephrosis 11/26/2018   Past Surgical History:  Procedure Laterality Date   APPENDECTOMY  1962   BREAST BIOPSY Right 2006?   benign   BREAST BIOPSY Left 2019   invasive mammary carcinoma   BREAST CYST EXCISION Left 07/27/2017   Procedure: SKIN CYST EXCISED;  Surgeon: Dessa Reyes ORN, MD;  Location: [facility] ORS;  Service: General;  Laterality: Left;   BREAST LUMPECTOMY Left 07/2017   invasive mammary, DCIS  mammosite   BREAST SURGERY Right 2019   CARPAL TUNNEL RELEASE Right    CATARACT EXTRACTION W/PHACO Left 11/11/2016   Procedure: CATARACT EXTRACTION PHACO AND INTRAOCULAR LENS PLACEMENT (IOC);  Surgeon: Jaye Fallow, MD;  Location: [facility] ORS;   Service: Ophthalmology;  Laterality: Left;  US  01:07.9 AP% 19.2 CDE 13.02 Fluid pack lot # [id number]   CATARACT EXTRACTION W/PHACO Right 12/09/2016   Procedure: CATARACT EXTRACTION PHACO AND INTRAOCULAR LENS PLACEMENT (IOC);  Surgeon: Jaye Fallow, MD;  Location: [facility];  Service: Ophthalmology;  Laterality: Right;  US  00:49 AP% 21.2 CDE 10.39 Fluid pack lot # [id number]   CHOLECYSTECTOMY     COLONOSCOPY WITH PROPOFOL  N/A 11/22/2014   Procedure: COLONOSCOPY WITH PROPOFOL ;  Surgeon: Lamar ONEIDA Holmes, MD;  Location: [facility] ENDOSCOPY;  Service: Endoscopy;  Laterality: N/A;   CYSTOSCOPY WITH STENT PLACEMENT Bilateral 11/27/2018   Procedure: CYSTOSCOPY WITH STENT PLACEMENT;  Surgeon: Nieves Cough, MD;  Location: [facility];  Service: Urology;  Laterality: Bilateral;   CYSTOSCOPY/URETEROSCOPY/HOLMIUM LASER/STENT PLACEMENT Bilateral 12/17/2018   Procedure: CYSTOSCOPY/URETEROSCOPY/HOLMIUM LASER/STENT Exchange;  Surgeon: Francisca Redell BROCKS, MD;  Location: [facility];  Service: Urology;  Laterality: Bilateral;   ESOPHAGOGASTRODUODENOSCOPY  11/22/2014   Procedure: ESOPHAGOGASTRODUODENOSCOPY (EGD);  Surgeon: Lamar ONEIDA Holmes, MD;  Location: [facility] ENDOSCOPY;  Service: Endoscopy;;   ESOPHAGOGASTRODUODENOSCOPY (EGD) WITH PROPOFOL  N/A 03/05/2017   Procedure: ESOPHAGOGASTRODUODENOSCOPY (EGD) WITH PROPOFOL ;  Surgeon: Unk Corinn Skiff, MD;  Location: [facility] ENDOSCOPY;  Service: Gastroenterology;  Laterality: N/A;   EXTRACORPOREAL SHOCK WAVE LITHOTRIPSY Right 03/04/2018   Procedure: EXTRACORPOREAL SHOCK WAVE LITHOTRIPSY (ESWL);  Surgeon: Francisca Redell BROCKS, MD;  Location: [facility] ORS;  Service: Urology;  Laterality: Right;   EYE SURGERY Bilateral 2012   cataract extraction with iol   GALLBLADDER SURGERY  1968   hemi pelvectomy     left side at age 51   HEMIPELVIC Left 1962   LEG SURGERY Left    AMPUTATION d/t cancer at 89 years old   MASTECTOMY, PARTIAL Left 07/27/2017   Procedure: MASTECTOMY PARTIAL;  Surgeon:  Dessa Reyes ORN, MD;  Location: [facility];  Service: General;  Laterality: Left;   MOUTH SURGERY  2019   teeth pulled with a bridge insertion.   fell out 12/16/18   OVARY SURGERY Right    cyst removed   SAVORY DILATION  11/22/2014   Procedure: SAVORY DILATION;  Surgeon: Lamar ONEIDA Holmes, MD;  Location: [facility] ENDOSCOPY;  Service: Endoscopy;;   SENTINEL NODE BIOPSY Left 07/27/2017   Procedure: SENTINEL NODE BIOPSY;  Surgeon: Dessa Reyes ORN, MD;  Location: [facility] ORS;  Service: General;  Laterality: Left;   SKIN CANCER EXCISION     nose and arm and face   TEE WITHOUT CARDIOVERSION N/A 07/25/2019   Procedure: TRANSESOPHAGEAL ECHOCARDIOGRAM (TEE);  Surgeon: Hester Wolm PARAS, MD;  Location: [facility] ORS;  Service: Cardiovascular;  Laterality: N/A;   TONSILLECTOMY      Allergies  Allergen Reactions  Ciprofloxacin  Hives   Codeine Other (See Comments)    HALLUCINATIONS   Tape     Rash and skin irritation/ paper tape and tegaderm OK   Ciprocinonide [Fluocinolone] Other (See Comments)    unknown   Amoxicillin Other (See Comments)    Upset stomach Has patient had a PCN reaction causing immediate rash, facial/tongue/throat swelling, SOB or lightheadedness with hypotension: No Has patient had a PCN reaction causing severe rash involving mucus membranes or skin necrosis: No Has patient had a PCN reaction that required hospitalization: No Has patient had a PCN reaction occurring within the last 10 years: Yes If all of the above answers are NO, then Damyah Gugel proceed with Cephalosporin use.;   Cefuroxime  Other (See Comments)    OK INTRACAMERALLY PER DR WLP, upset stomach   Latex Rash    Rast test NEGATIVE   Lipitor [Atorvastatin] Other (See Comments)    unknown    Outpatient Encounter Medications as of 03/08/2024  Medication Sig   acetaminophen  (TYLENOL ) 650 MG CR tablet Take 650 mg by mouth every 8 (eight) hours as needed for pain.   Cholecalciferol  (VITAMIN D -1000 MAX ST) 25 MCG (1000 UT) tablet  Take 1,000 Units by mouth daily.   dextromethorphan-guaiFENesin  (ROBITUSSIN-DM) 10-100 MG/5ML liquid Take 10 mLs by mouth every 4 (four) hours as needed for cough.   diclofenac  Sodium (VOLTAREN ) 1 % GEL Apply 4 g topically. Apply to stump topically every 6 hours as needed. Apply to right knee topically three times daily for pain.   fluticasone  (FLONASE ) 50 MCG/ACT nasal spray Place 2 sprays into both nostrils 2 (two) times daily.   gabapentin  (NEURONTIN ) 100 MG capsule Take 100 mg by mouth 3 (three) times daily.   GLIPIZIDE  XL 2.5 MG 24 hr tablet Take 2.5 mg by mouth daily.    hydrocortisone 2.5 % cream Apply to Hemorrhoids/buttucks topically every 8 hours as needed for hemorrhoids.   ipratropium-albuterol  (DUONEB) 0.5-2.5 (3) MG/3ML SOLN Take 3 mLs by nebulization every 4 (four) hours as needed.   loratadine  (CLARITIN ) 10 MG tablet Take 10 mg by mouth daily.   melatonin 3 MG TABS tablet Take 3 mg by mouth at bedtime.   metFORMIN  (GLUCOPHAGE ) 1000 MG tablet Take 1,000 mg by mouth 2 (two) times daily.   metoprolol  tartrate (LOPRESSOR ) 25 MG tablet Take 25 mg by mouth 2 (two) times daily.   metroNIDAZOLE  (METROGEL ) 1 % gel Apply topically daily.   ondansetron  (ZOFRAN ) 4 MG tablet Take 4 mg by mouth every 4 (four) hours as needed for nausea or vomiting.   OXYGEN 2lpm as needed for sats less than 88% every 1 hour as needed   pantoprazole  (PROTONIX ) 40 MG tablet Take 40 mg by mouth daily.   PARoxetine  (PAXIL ) 30 MG tablet Take 30 mg by mouth daily.   Polyethyl Glycol-Propyl Glycol 0.4-0.3 % SOLN Apply to eye 2 (two) times daily as needed.   polyethylene glycol (MIRALAX  / GLYCOLAX ) 17 g packet Take 17 g by mouth daily as needed.   PRADAXA  150 MG CAPS capsule TAKE 1 CAPSULE BY MOUTH TWICE DAILY START TREATMENT 10 HRS LAST DOSE OF LOVENOX    sitaGLIPtin (JANUVIA) 100 MG tablet Take 100 mg by mouth daily.   Sodium Fluoride (PREVIDENT 5000 BOOSTER PLUS) 1.1 % PSTE Place onto teeth at bedtime.   SUNSCREEN  SPF50 EX Apply topically. Apply to exposed skin topically every 2 hours as needed for Sunburn Prevention   tiZANidine  (ZANAFLEX ) 2 MG tablet Take 2 mg by mouth every  12 (twelve) hours as needed for muscle spasms.   clindamycin (CLEOCIN) 300 MG capsule Take 300 mg by mouth in the morning, at noon, in the evening, and at bedtime. (Patient not taking: Reported on 03/08/2024)   LORazepam  (ATIVAN ) 1 MG tablet Take by mouth. (Patient not taking: Reported on 03/08/2024)   No facility-administered encounter medications on file as of 03/08/2024.    Review of Systems ***  Immunization History  Administered Date(s) Administered   INFLUENZA, HIGH DOSE SEASONAL PF 03/07/2023   Influenza-Unspecified 02/03/2020, 02/18/2021, 02/18/2022, 02/16/2024   Moderna Covid-19 Fall Seasonal Vaccine 25yrs & older 08/12/2022   PNEUMOCOCCAL CONJUGATE-20 01/17/2023   Pneumococcal Conjugate-13 09/28/2017   Tdap 12/31/2022   Unspecified SARS-COV-2 Vaccination 05/20/2019, 05/20/2019, 06/17/2019, 03/16/2020, 09/20/2020, 01/25/2021, 01/30/2023, 03/07/2023, 02/26/2024   Zoster Recombinant(Shingrix) 12/25/2023   Pertinent  Health Maintenance Due  Topic Date Due   Mammogram  04/13/2024   HEMOGLOBIN A1C  03/10/2024   OPHTHALMOLOGY EXAM  11/11/2024   FOOT EXAM  12/24/2024   Influenza Vaccine  Completed   DEXA SCAN  Completed      12/19/2021    7:00 AM 12/19/2021   12:00 PM 01/24/2022   11:20 AM 12/11/2022    1:31 PM 12/24/2023   11:58 AM  Fall Risk  Falls in the past year?    0 0  (RETIRED) Patient Fall Risk Level High fall risk  High fall risk  High fall risk     Patient at Risk for Falls Due to     Impaired balance/gait;Impaired mobility     Data saved with a previous flowsheet row definition   Functional Status Survey:    Vitals:   03/08/24 1301  BP: 121/69  Pulse: 74  Resp: 18  Temp: 99 F (37.2 C)  SpO2: 90%  Weight: 144 lb 9.6 oz (65.6 kg)  Height: 4' 10 (1.473 m)   Body mass index is 30.22  kg/m. Physical Exam***  Labs reviewed: Recent Labs    03/28/23 0426 03/29/23 0414 03/30/23 0642 04/06/23 0000 08/04/23 1011 09/03/23 1116 02/03/24 1015 02/29/24 0000  NA 138 138 137   < > 137 138 136 139  K 4.2 4.4 5.5*   < > 4.0 4.1 4.9 4.3  CL 101 103 103   < > 102 104 105 107  CO2 29 27 25    < > 25 24 25  26*  GLUCOSE 177* 173* 202*  --  191* 175* 171*  --   BUN 11 10 8    < > 14 16 17 12   CREATININE 0.49 0.50 0.62   < > 0.71 0.62 0.59 0.4*  CALCIUM 8.7* 9.1 9.3   < > 11.0* 10.2 11.6* 10.4  MG 1.5* 1.9 1.7  --   --   --   --   --   PHOS 2.5 1.9* 2.4*  --   --   --   --   --    < > = values in this interval not displayed.   Recent Labs    03/21/23 0502 03/22/23 0500 03/23/23 0507 02/29/24 0000  AST 18 15 22 16   ALT 10 12 14 13   ALKPHOS 45 46 53 63  BILITOT 0.4 <0.2 0.4  --   PROT 5.7* 5.9* 6.3*  --   ALBUMIN 2.8* 2.7* 2.8* 3.4*   Recent Labs    03/29/23 0414 04/06/23 0000 08/04/23 1011 02/03/24 1015 02/29/24 0000  WBC 10.1   < > 10.5 9.5 10.0  NEUTROABS  --    < >  7.2 6.3 6,740.00  HGB 10.9*   < > 12.7 12.5 11.3*  HCT 35.1*   < > 41.4 40.4 36  MCV 92.9  --  85.4 79.8*  --   PLT 312   < > 241 241 212   < > = values in this interval not displayed.   Lab Results  Component Value Date   TSH 3.77 04/16/2023   Lab Results  Component Value Date   HGBA1C 7.6 09/08/2023   Lab Results  Component Value Date   CHOL 146 03/07/2024   HDL 29 (A) 03/07/2024   LDLCALC 81 03/07/2024   TRIG 270 (A) 03/07/2024   CHOLHDL 5.8 12/15/2021    Significant Diagnostic Results in last 30 days:  No results found.  Assessment/Plan No problem-specific Assessment & Plan notes found for this encounter.     Jessica K. Caro [facility] [phone]

## 2024-03-08 NOTE — Progress Notes (Unsigned)
 Location:   [facility] of Service:  [place] [address] [place] [address]  Harlene An, NP  PCP: Laurence Locus, DO  Patient Care Team: Laurence Locus, DO as PCP - General (Internal Medicine) Dessa, Reyes ORN, MD (General Surgery) Jacobo Evalene PARAS, MD as Consulting Physician (Oncology) Pa, [facility])  Extended Emergency Contact Information Primary Emergency Contact: Kelsie Maryruth FALCON Address: [address]          [place], [place] [address] [place] Home Phone: [phone] Mobile Phone: [phone] Relation: Daughter  Goals of care: Advanced Directive information    02/03/2024   10:48 AM  Advanced Directives  Does Patient Have a Medical Advance Directive? Yes  Type of Advance Directive Out of facility DNR (pink MOST or yellow form)  Copy of Healthcare Power of Attorney in Chart? Yes - validated most recent copy scanned in chart (See row information)  Pre-existing out of facility DNR order (yellow form or pink MOST form) Pink MOST/Yellow Form most recent copy in chart - Physician notified to receive inpatient order   Chief Complaint  Patient presents with   Medical Management of Chronic Issues    Medical Management of Chronic Issues.    HPI:  The patient is an 89 year old female seen today for ongoing medical management of chronic conditions. She was observed in her wheelchair in her room watching television.  This morning, she experienced a coughing episode lasting approximately one hour after aspirating a sip of water. She reports a similar incident recently involving broccoli. She frequently clears her throat but denies any current swallowing difficulties and is able to take medications whole with pudding.  She expresses concern about memory decline, stating she forgets many things. She has been on Paxil  for an extended period and reports good symptom control at her current dose. Although the pharmacy recommends a dosage reduction, she  questions the timing and necessity of this change, especially with upcoming dental procedures. She reports feeling anxious when discussing changes to her Paxil  regimen and prefers to defer any adjustments until after her dental surgeries. She typically requires anti-anxiety medication prior to procedures.  The patient reports significant dental pain and states that all upper teeth are scheduled for extraction this month. The lower teeth will be evaluated for salvageability. Due to her dental issues, she has difficulty chewing and avoids certain foods. She notes that meats are served in small pieces with gravy, which she dislikes. Mashed potatoes require added butter, and greens are often undercooked, making them hard to chew. Despite a normal appetite, she frequently sends meals back due to texture and preparation issues. She prefers vegetables and is not fond of fruit or fried foods. Dietary staff attempt to accommodate her preferences, with variable success.  She has experienced some unintentional weight loss and currently weighs 144 lbs. She is not alarmed but wishes to maintain her health. She reports chronic right knee pain, limiting mobility and weight-bearing on that side. She is not enrolled in formal physical therapy but participates in group exercise classes once or twice weekly and attends tai chi sessions weekly.  Bowel movements have been loose for the past 2-3 weeks, occurring unpredictably. Previously, she had regular BMs after breakfast or in the afternoon. She denies blood or melena. Urinary function is stable: she voids 3-4 times during the day and twice at night using a bedpan.  Hydration appears adequate; she drinks 1-2 flasks of water daily (approximately 48 oz), along with unsweetened tea at meals  and coffee at breakfast. She frequently expectorates thick, clear mucus.  She denies nausea, vomiting, chest pain, shortness of breath, or headaches. She reports no new pain. Hemorrhoidal  discomfort exacerbated by undergarment elastic is managed with topical cream. Voltaren  gel is applied to her right knee twice daily with relief. Sleep is generally restful; she keeps the TV on, blinds open, and door ajar at night. Occasionally wakes at 3:00 AM to urinate and drink water, then returns to sleep.  She received new glasses within the past year. Her daughter resides in [place].  Recent labs include: Lipid panel (03/07/24): Triglycerides 270, HDL 29 CMP and CBC (02/29/24) A1C (May 2025): 7.6% TSH (December 2024): 3.77  Speech therapy is scheduled to evaluate swallowing function. The patient reiterates her preference to postpone any changes to her Paxil  dosage until after dental procedures are completed. A reassessment is planned in one month.  Past Medical History:  Diagnosis Date   Anxiety    Benign breast lumps    Blood clotting disorder    Bone cancer (HCC)    head of femur, left leg ... 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  Oncotype recurrence score: 1.  DCIS, margin less than 0.5 mm.  Negative sentinel node.  Accelerated partial breast radiation.   Cervicalgia    Chronic kidney disease    kidney stones   Colon cancer [facility])    patient unaware of this   Complication of anesthesia    mood alteration / not sure if d/t pain medicine or anesthesia as she passed out    Cough    NASAL DRIP / SNEEZING / SORE THROAT MOSTLY CONSTANT   Depression    Diabetes (HCC)    Diverticulitis    Dizzy    DVT (deep venous thrombosis) (HCC) 07/27/2017   GERD (gastroesophageal reflux disease)    H/O blood clots    arm and leg   HBP (high blood pressure)    Hematuria    gross   Hemorrhoids    HLD (hyperlipidemia)    Microscopic hematuria 06/02/2015   Muscle pain    Osteoarthritis    Osteoporosis    Panic disorder    Personal history of  radiation therapy    PND (post-nasal drip)    CHRONIC WITH SORE THROAT AND SNEEZING   Reflux    Sepsis (HCC)    Sepsis due to pneumonia (HCC) 03/20/2023   Sepsis, unspecified organism (HCC) 12/15/2021   Formatting of this note might be different from the original.  Last Assessment & Plan: Formatting of this note might be different from the original. Appears to be multifactorial and secondary to healthcare associated pneumonia as well as a UTI CT angio shows asymmetric airspace disease at the left base and posteriorly in the right upper lobe concerning for pneumonia Patient also has pyuria Patient   Severe sepsis (HCC) 12/15/2021   Skin cancer    nose   Swelling    Tremor    Tremors of nervous system    Ureteral stone with hydronephrosis 11/26/2018   Past Surgical History:  Procedure Laterality Date   APPENDECTOMY  1962   BREAST BIOPSY Right 2006?   benign   BREAST BIOPSY Left 2019   invasive mammary carcinoma   BREAST CYST EXCISION Left 07/27/2017   Procedure: SKIN CYST EXCISED;  Surgeon: Dessa Reyes ORN, MD;  Location:  [facility] ORS;  Service: General;  Laterality: Left;   BREAST LUMPECTOMY Left 07/2017   invasive mammary, DCIS  mammosite   BREAST SURGERY Right 2019   CARPAL TUNNEL RELEASE Right    CATARACT EXTRACTION W/PHACO Left 11/11/2016   Procedure: CATARACT EXTRACTION PHACO AND INTRAOCULAR LENS PLACEMENT (IOC);  Surgeon: Jaye Fallow, MD;  Location: [facility] ORS;  Service: Ophthalmology;  Laterality: Left;  US  01:07.9 AP% 19.2 CDE 13.02 Fluid pack lot # [id number]   CATARACT EXTRACTION W/PHACO Right 12/09/2016   Procedure: CATARACT EXTRACTION PHACO AND INTRAOCULAR LENS PLACEMENT (IOC);  Surgeon: Jaye Fallow, MD;  Location: [facility] ORS;  Service: Ophthalmology;  Laterality: Right;  US  00:49 AP% 21.2 CDE 10.39 Fluid pack lot # [id number]   CHOLECYSTECTOMY     COLONOSCOPY WITH PROPOFOL  N/A 11/22/2014   Procedure: COLONOSCOPY WITH PROPOFOL ;  Surgeon: Lamar ONEIDA Holmes, MD;   Location: [facility] ENDOSCOPY;  Service: Endoscopy;  Laterality: N/A;   CYSTOSCOPY WITH STENT PLACEMENT Bilateral 11/27/2018   Procedure: CYSTOSCOPY WITH STENT PLACEMENT;  Surgeon: Nieves Cough, MD;  Location: [facility] ORS;  Service: Urology;  Laterality: Bilateral;   CYSTOSCOPY/URETEROSCOPY/HOLMIUM LASER/STENT PLACEMENT Bilateral 12/17/2018   Procedure: CYSTOSCOPY/URETEROSCOPY/HOLMIUM LASER/STENT Exchange;  Surgeon: Francisca Redell BROCKS, MD;  Location: [facility] ORS;  Service: Urology;  Laterality: Bilateral;   ESOPHAGOGASTRODUODENOSCOPY  11/22/2014   Procedure: ESOPHAGOGASTRODUODENOSCOPY (EGD);  Surgeon: Lamar ONEIDA Holmes, MD;  Location: [facility] ENDOSCOPY;  Service: Endoscopy;;   ESOPHAGOGASTRODUODENOSCOPY (EGD) WITH PROPOFOL  N/A 03/05/2017   Procedure: ESOPHAGOGASTRODUODENOSCOPY (EGD) WITH PROPOFOL ;  Surgeon: Unk Corinn Skiff, MD;  Location: [facility] ENDOSCOPY;  Service: Gastroenterology;  Laterality: N/A;   EXTRACORPOREAL SHOCK WAVE LITHOTRIPSY Right 03/04/2018   Procedure: EXTRACORPOREAL SHOCK WAVE LITHOTRIPSY (ESWL);  Surgeon: Francisca Redell BROCKS, MD;  Location: [facility] ORS;  Service: Urology;  Laterality: Right;   EYE SURGERY Bilateral 2012   cataract extraction with iol   GALLBLADDER SURGERY  1968   hemi pelvectomy     left side at age 54   HEMIPELVIC Left 1962   LEG SURGERY Left    AMPUTATION d/t cancer at 89 years old   MASTECTOMY, PARTIAL Left 07/27/2017   Procedure: MASTECTOMY PARTIAL;  Surgeon: Dessa Reyes ORN, MD;  Location: [facility] ORS;  Service: General;  Laterality: Left;   MOUTH SURGERY  2019   teeth pulled with a bridge insertion.   fell out 12/16/18   OVARY SURGERY Right    cyst removed   SAVORY DILATION  11/22/2014   Procedure: SAVORY DILATION;  Surgeon: Lamar ONEIDA Holmes, MD;  Location: [facility] ENDOSCOPY;  Service: Endoscopy;;   SENTINEL NODE BIOPSY Left 07/27/2017   Procedure: SENTINEL NODE BIOPSY;  Surgeon: Dessa Reyes ORN, MD;  Location: [facility] ORS;  Service: General;  Laterality: Left;   SKIN  CANCER EXCISION     nose and arm and face   TEE WITHOUT CARDIOVERSION N/A 07/25/2019   Procedure: TRANSESOPHAGEAL ECHOCARDIOGRAM (TEE);  Surgeon: Hester Wolm PARAS, MD;  Location: [facility] ORS;  Service: Cardiovascular;  Laterality: N/A;   TONSILLECTOMY     Allergies  Allergen Reactions   Ciprofloxacin  Hives   Codeine Other (See Comments)    HALLUCINATIONS   Tape     Rash and skin irritation/ paper tape and tegaderm OK   Ciprocinonide [Fluocinolone] Other (See Comments)    unknown   Amoxicillin Other (See Comments)    Upset stomach Has patient had a PCN reaction causing immediate rash, facial/tongue/throat swelling, SOB or lightheadedness with hypotension: No Has patient had a PCN reaction causing severe rash involving mucus membranes  or skin necrosis: No Has patient had a PCN reaction that required hospitalization: No Has patient had a PCN reaction occurring within the last 10 years: Yes If all of the above answers are NO, then may proceed with Cephalosporin use.;   Cefuroxime  Other (See Comments)    OK INTRACAMERALLY PER DR WLP, upset stomach   Latex Rash    Rast test NEGATIVE   Lipitor [Atorvastatin] Other (See Comments)    unknown   Outpatient Encounter Medications as of 03/08/2024  Medication Sig   acetaminophen  (TYLENOL ) 650 MG CR tablet Take 650 mg by mouth every 8 (eight) hours as needed for pain.   Cholecalciferol  (VITAMIN D -1000 MAX ST) 25 MCG (1000 UT) tablet Take 1,000 Units by mouth daily.   dextromethorphan-guaiFENesin  (ROBITUSSIN-DM) 10-100 MG/5ML liquid Take 10 mLs by mouth every 4 (four) hours as needed for cough.   diclofenac  Sodium (VOLTAREN ) 1 % GEL Apply 4 g topically. Apply to stump topically every 6 hours as needed. Apply to right knee topically three times daily for pain.   fluticasone  (FLONASE ) 50 MCG/ACT nasal spray Place 2 sprays into both nostrils 2 (two) times daily.   gabapentin  (NEURONTIN ) 100 MG capsule Take 100 mg by mouth 3 (three) times daily.    GLIPIZIDE  XL 2.5 MG 24 hr tablet Take 2.5 mg by mouth daily.    hydrocortisone 2.5 % cream Apply to Hemorrhoids/buttucks topically every 8 hours as needed for hemorrhoids.   ipratropium-albuterol  (DUONEB) 0.5-2.5 (3) MG/3ML SOLN Take 3 mLs by nebulization every 4 (four) hours as needed.   loratadine  (CLARITIN ) 10 MG tablet Take 10 mg by mouth daily.   melatonin 3 MG TABS tablet Take 3 mg by mouth at bedtime.   metFORMIN  (GLUCOPHAGE ) 1000 MG tablet Take 1,000 mg by mouth 2 (two) times daily.   metoprolol  tartrate (LOPRESSOR ) 25 MG tablet Take 25 mg by mouth 2 (two) times daily.   metroNIDAZOLE  (METROGEL ) 1 % gel Apply topically daily.   ondansetron  (ZOFRAN ) 4 MG tablet Take 4 mg by mouth every 4 (four) hours as needed for nausea or vomiting.   OXYGEN 2lpm as needed for sats less than 88% every 1 hour as needed   pantoprazole  (PROTONIX ) 40 MG tablet Take 40 mg by mouth daily.   PARoxetine  (PAXIL ) 30 MG tablet Take 30 mg by mouth daily.   Polyethyl Glycol-Propyl Glycol 0.4-0.3 % SOLN Apply to eye 2 (two) times daily as needed.   polyethylene glycol (MIRALAX  / GLYCOLAX ) 17 g packet Take 17 g by mouth daily as needed.   PRADAXA  150 MG CAPS capsule TAKE 1 CAPSULE BY MOUTH TWICE DAILY START TREATMENT 10 HRS LAST DOSE OF LOVENOX    sitaGLIPtin (JANUVIA) 100 MG tablet Take 100 mg by mouth daily.   Sodium Fluoride (PREVIDENT 5000 BOOSTER PLUS) 1.1 % PSTE Place onto teeth at bedtime.   SUNSCREEN SPF50 EX Apply topically. Apply to exposed skin topically every 2 hours as needed for Sunburn Prevention   tiZANidine  (ZANAFLEX ) 2 MG tablet Take 2 mg by mouth every 12 (twelve) hours as needed for muscle spasms.   clindamycin (CLEOCIN) 300 MG capsule Take 300 mg by mouth in the morning, at noon, in the evening, and at bedtime. (Patient not taking: Reported on 03/08/2024)   LORazepam  (ATIVAN ) 1 MG tablet Take by mouth. (Patient not taking: Reported on 03/08/2024)   No facility-administered encounter medications on  file as of 03/08/2024.   Review of Systems  Constitutional: Negative.   HENT:  Positive for dental problem.  Eyes: Negative.   Respiratory:  Positive for choking.   Cardiovascular:  Positive for leg swelling.  Gastrointestinal:  Positive for diarrhea.  Genitourinary: Negative.   Musculoskeletal:  Positive for arthralgias.  Skin: Negative.   Neurological: Negative.   Psychiatric/Behavioral:  The patient is nervous/anxious.     Immunization History  Administered Date(s) Administered   INFLUENZA, HIGH DOSE SEASONAL PF 03/07/2023   Influenza-Unspecified 02/03/2020, 02/18/2021, 02/18/2022, 02/16/2024   Moderna Covid-19 Fall Seasonal Vaccine 39yrs & older 08/12/2022   PNEUMOCOCCAL CONJUGATE-20 01/17/2023   Pneumococcal Conjugate-13 09/28/2017   Tdap 12/31/2022   Unspecified SARS-COV-2 Vaccination 05/20/2019, 05/20/2019, 06/17/2019, 03/16/2020, 09/20/2020, 01/25/2021, 01/30/2023, 03/07/2023, 02/26/2024   Zoster Recombinant(Shingrix) 12/25/2023   Pertinent  Health Maintenance Due  Topic Date Due   Mammogram  04/13/2024   HEMOGLOBIN A1C  03/10/2024   OPHTHALMOLOGY EXAM  11/11/2024   FOOT EXAM  12/24/2024   Influenza Vaccine  Completed   DEXA SCAN  Completed      12/19/2021    7:00 AM 12/19/2021   12:00 PM 01/24/2022   11:20 AM 12/11/2022    1:31 PM 12/24/2023   11:58 AM  Fall Risk  Falls in the past year?    0 0  (RETIRED) Patient Fall Risk Level High fall risk  High fall risk  High fall risk     Patient at Risk for Falls Due to     Impaired balance/gait;Impaired mobility     Data saved with a previous flowsheet row definition   Functional Status Survey:    Vitals:   03/08/24 1301  BP: 121/69  Pulse: 74  Resp: 18  Temp: 99 F (37.2 C)  SpO2: 90%  Weight: 144 lb 9.6 oz (65.6 kg)  Height: 4' 10 (1.473 m)   Body mass index is 30.22 kg/m. Physical Exam Vitals reviewed.  Constitutional:      Appearance: Normal appearance. She is obese.  HENT:     Head: Normocephalic  and atraumatic.     Right Ear: External ear normal.     Left Ear: External ear normal.     Nose: Nose normal.     Mouth/Throat:     Mouth: Mucous membranes are moist.     Dentition: Abnormal dentition. Dental tenderness and dental caries present.     Pharynx: Oropharynx is clear.  Eyes:     Conjunctiva/sclera: Conjunctivae normal.  Cardiovascular:     Rate and Rhythm: Normal rate and regular rhythm.     Pulses: Normal pulses.     Heart sounds: Normal heart sounds.  Pulmonary:     Effort: Pulmonary effort is normal.     Breath sounds: Normal breath sounds.  Abdominal:     General: Bowel sounds are normal.     Palpations: Abdomen is soft.  Musculoskeletal:     Right lower leg: Edema present.  Skin:    General: Skin is warm and dry.  Neurological:     General: No focal deficit present.     Mental Status: She is alert and oriented to person, place, and time. Mental status is at baseline.    Labs reviewed: Recent Labs    03/28/23 0426 03/29/23 0414 03/30/23 0642 04/06/23 0000 08/04/23 1011 09/03/23 1116 02/03/24 1015 02/29/24 0000  NA 138 138 137   < > 137 138 136 139  K 4.2 4.4 5.5*   < > 4.0 4.1 4.9 4.3  CL 101 103 103   < > 102 104 105 107  CO2 29 27 25    < >  25 24 25  26*  GLUCOSE 177* 173* 202*  --  191* 175* 171*  --   BUN 11 10 8    < > 14 16 17 12   CREATININE 0.49 0.50 0.62   < > 0.71 0.62 0.59 0.4*  CALCIUM 8.7* 9.1 9.3   < > 11.0* 10.2 11.6* 10.4  MG 1.5* 1.9 1.7  --   --   --   --   --   PHOS 2.5 1.9* 2.4*  --   --   --   --   --    < > = values in this interval not displayed.   Recent Labs    03/21/23 0502 03/22/23 0500 03/23/23 0507 02/29/24 0000  AST 18 15 22 16   ALT 10 12 14 13   ALKPHOS 45 46 53 63  BILITOT 0.4 <0.2 0.4  --   PROT 5.7* 5.9* 6.3*  --   ALBUMIN 2.8* 2.7* 2.8* 3.4*   Recent Labs    03/29/23 0414 04/06/23 0000 08/04/23 1011 02/03/24 1015 02/29/24 0000  WBC 10.1   < > 10.5 9.5 10.0  NEUTROABS  --    < > 7.2 6.3 6,740.00   HGB 10.9*   < > 12.7 12.5 11.3*  HCT 35.1*   < > 41.4 40.4 36  MCV 92.9  --  85.4 79.8*  --   PLT 312   < > 241 241 212   < > = values in this interval not displayed.   Lab Results  Component Value Date   TSH 3.77 04/16/2023   Lab Results  Component Value Date   HGBA1C 7.6 09/08/2023   Lab Results  Component Value Date   CHOL 146 03/07/2024   HDL 29 (A) 03/07/2024   LDLCALC 81 03/07/2024   TRIG 270 (A) 03/07/2024   CHOLHDL 5.8 12/15/2021   Significant Diagnostic Results in last 30 days:  No results found.  Assessment/Plan  1. Type 2 Diabetes Mellitus with Peripheral Neuropathy (HCC) - Primary Diagnosis  - Last A1C (May 2025): 7.6% - Recheck Hgb A1C at next lab draw - Continue Glipizide , Metformin , and Januvia as prescribed - Routine monitoring of A1C; adjust medications as needed  2. Encounter for Medication Titration  - Discussed benefits and rationale for reducing Paxil  dosage - Patient prefers to defer changes until after upcoming dental procedures - Plan to reevaluate in 1 month  3. Hyperlipidemia, Unspecified Type  - Lipid panel (03/07/24): Triglycerides 270, HDL 29 - Continue routine monitoring - Will assess need for pharmacologic intervention  4. Hyperthyroidism  - Last TSH (December 2024): 3.77 - TSH ordered for next lab draw - Continue monitoring thyroid  function and symptoms  5. Gastroesophageal Reflux Disease (GERD) without Esophagitis  - Patient reports chronic cough with clear phlegm - Continue Protonix  as prescribed - Reinforced lifestyle modifications: upright posture post-meals, slow chewing  6. Essential Hypertension  - Blood pressure stable - Continue Metoprolol  as prescribed - CMP (02/29/24): within normal limits - Continue routine CMP monitoring  7. Recurrent Major Depressive Disorder, Partial Remission  - Mood noted as anxious but stable - Paxil  dose reduction discussed; patient declines until post-dental procedure - Will  reassess in 1 month  8. Paroxysmal Atrial Fibrillation (HCC)  - Condition chronic and stable - Continue Metoprolol  and Pradaxa  as prescribed - Monitor for signs/symptoms of bleeding  9. Chronic Diarrhea  - Ongoing issue, no recent changes - Confirmed patient not receiving Miralax  routinely - Continue monitoring for progression or complications  10.  Osteoarthritis of Right Knee  - Chronic pain persists - Continue Tylenol , Tizanidine , and Voltaren  gel as prescribed - Encourage participation in group exercise as tolerated  11. Obesity (BMI 30.0-39.9)  - Mild weight loss noted: 5 lbs over past month - Continue monitoring weight trends - Encourage continued physical activity and group exercise  12. Dental Caries  - Poor dentition noted; under active dental care - Scheduled for full upper extraction; lower teeth to be evaluated - Ativan  prescribed pre-procedure for anxiety - Dietary modifications in place due to chewing difficulty - Speech therapy consult scheduled for swallow evaluation  13. Factor V Leiden  - Chronic condition - Continue Pradaxa  as prescribed   Waylan Rabon, AGPCNP Student - [address]  [place] [phone]

## 2024-03-09 DIAGNOSIS — Z5181 Encounter for therapeutic drug level monitoring: Secondary | ICD-10-CM | POA: Insufficient documentation

## 2024-03-18 ENCOUNTER — Telehealth: Payer: Self-pay | Admitting: Orthopedic Surgery

## 2024-03-18 NOTE — Telephone Encounter (Signed)
 TSH 0.01> was 0.38 a few months ago. Will repeat TSH to confirm value. H/o thyroid  nodules. U/S thyroid  done in 2023 recommended FNA. Unclear if done in past.

## 2024-03-21 LAB — TSH: TSH: 0.01 — AB (ref 0.41–5.90)

## 2024-03-23 ENCOUNTER — Telehealth: Payer: Self-pay

## 2024-03-23 NOTE — Telephone Encounter (Signed)
 I called [facility] in regards to a mutual patient of ours, and before hanging up the phone the nurse on the phone asked me about a patient they have there that is one of our patients, well the patient ended up being one of Dr. Jerone patients. So the nurse said that they tried to call in regards to Julie Acosta and her Prolia . I told the nurse that I would relay this message to Dr. Jacobo and call her back.

## 2024-03-23 NOTE — Telephone Encounter (Signed)
 Called and spoke with the same nurse that I spoke with earlier today. She wanted me to make Dr. Jacobo aware of some vaginal bleeding ongoing for about a month now, she does have a OBGYN appointment in December this year.

## 2024-04-04 ENCOUNTER — Non-Acute Institutional Stay (SKILLED_NURSING_FACILITY): Admitting: Nurse Practitioner

## 2024-04-04 DIAGNOSIS — F419 Anxiety disorder, unspecified: Secondary | ICD-10-CM

## 2024-04-04 DIAGNOSIS — I10 Essential (primary) hypertension: Secondary | ICD-10-CM

## 2024-04-04 DIAGNOSIS — K219 Gastro-esophageal reflux disease without esophagitis: Secondary | ICD-10-CM | POA: Diagnosis not present

## 2024-04-04 DIAGNOSIS — E1142 Type 2 diabetes mellitus with diabetic polyneuropathy: Secondary | ICD-10-CM

## 2024-04-04 DIAGNOSIS — R4789 Other speech disturbances: Secondary | ICD-10-CM

## 2024-04-04 DIAGNOSIS — K029 Dental caries, unspecified: Secondary | ICD-10-CM

## 2024-04-04 DIAGNOSIS — Z7984 Long term (current) use of oral hypoglycemic drugs: Secondary | ICD-10-CM

## 2024-04-04 DIAGNOSIS — N939 Abnormal uterine and vaginal bleeding, unspecified: Secondary | ICD-10-CM

## 2024-04-04 NOTE — Progress Notes (Signed)
 Location:  Other Nursing Home Room Number: [facility] Place of Service:  SNF (31)  Laurence Locus, DO  Patient Care Team: Laurence Locus, DO as PCP - General (Internal Medicine) Dessa, Reyes ORN, MD (General Surgery) Jacobo Evalene PARAS, MD as Consulting Physician (Oncology) Pa, [facility])  Extended Emergency Contact Information Primary Emergency Contact: Kelsie Maryruth FALCON Address: [address]          [place], [place] [address] [place] Home Phone: [phone] Mobile Phone: [phone] Relation: Daughter  Goals of care: Advanced Directive information    02/03/2024   10:48 AM  Advanced Directives  Does Patient Have a Medical Advance Directive? Yes  Type of Advance Directive Out of facility DNR (pink MOST or yellow form)  Copy of Healthcare Power of Attorney in Chart? Yes - validated most recent copy scanned in chart (See row information)  Pre-existing out of facility DNR order (yellow form or pink MOST form) Pink MOST/Yellow Form most recent copy in chart - Physician notified to receive inpatient order     Chief Complaint  Patient presents with   Medical Management of Chronic Issues    Routine follow up and nausea     HPI:  Pt is a 89 y.o. female seen today for medical management of chronic disease and to follow up nausea and decrease in appetite.  She recently had 20 teeth pulled. She reports her mouth is still tender and hurts when she eats anything hard. Unable to chew.  Reports tenderness to her abdomen above navel that has been ongoing for several weeks.  She denies any indigestion or acid reflux. She has been on protonix  40 mg daily- no symptoms.  No pain with urination.   She has continued to have blood noted in diaper and noted to have blood in toilet when she urinates.  No blood in her stools but has had diarrhea which she feels like is worse over the last few months.   Reports her shoulders hurt because she has to lift herself to  get into her specialized wheelchair with her arms and puts more pressure on her shoulders.   Reports she sleeps well at night. Able to go to sleep well- will wake up to use the bathroom on the bedpan- takes her about 30 mins but able to go back to sleep.   Last week she had an episode where she could not talk.  She knew what she wanted to say but speech was garbled. Reports her brain was not working.  Last 4-6 hours and then resolved.  No weakness noted.   Past Medical History:  Diagnosis Date   Anxiety    Benign breast lumps    Blood clotting disorder    Bone cancer (HCC)    head of femur, left leg ... 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  Oncotype recurrence score: 1.  DCIS, margin less than 0.5 mm.  Negative sentinel node.  Accelerated partial breast radiation.   Cervicalgia    Chronic kidney disease    kidney stones   Colon cancer [facility])    patient unaware of this   Complication of anesthesia    mood alteration / not sure if d/t pain medicine or anesthesia as she passed out    Cough    NASAL DRIP / SNEEZING / SORE THROAT MOSTLY CONSTANT  Depression    Diabetes (HCC)    Diverticulitis    Dizzy    DVT (deep venous thrombosis) (HCC) 07/27/2017   GERD (gastroesophageal reflux disease)    H/O blood clots    arm and leg   HBP (high blood pressure)    Hematuria    gross   Hemorrhoids    HLD (hyperlipidemia)    Microscopic hematuria 06/02/2015   Muscle pain    Osteoarthritis    Osteoporosis    Panic disorder    Personal history of radiation therapy    PND (post-nasal drip)    CHRONIC WITH SORE THROAT AND SNEEZING   Reflux    Sepsis (HCC)    Sepsis due to pneumonia (HCC) 03/20/2023   Sepsis, unspecified organism (HCC) 12/15/2021   Formatting of this note might be different from the original.  Last Assessment & Plan: Formatting of this  note might be different from the original. Appears to be multifactorial and secondary to healthcare associated pneumonia as well as a UTI CT angio shows asymmetric airspace disease at the left base and posteriorly in the right upper lobe concerning for pneumonia Patient also has pyuria Patient   Severe sepsis (HCC) 12/15/2021   Skin cancer    nose   Swelling    Tremor    Tremors of nervous system    Ureteral stone with hydronephrosis 11/26/2018   Past Surgical History:  Procedure Laterality Date   APPENDECTOMY  1962   BREAST BIOPSY Right 2006?   benign   BREAST BIOPSY Left 2019   invasive mammary carcinoma   BREAST CYST EXCISION Left 07/27/2017   Procedure: SKIN CYST EXCISED;  Surgeon: Dessa Reyes ORN, MD;  Location: [facility] ORS;  Service: General;  Laterality: Left;   BREAST LUMPECTOMY Left 07/2017   invasive mammary, DCIS  mammosite   BREAST SURGERY Right 2019   CARPAL TUNNEL RELEASE Right    CATARACT EXTRACTION W/PHACO Left 11/11/2016   Procedure: CATARACT EXTRACTION PHACO AND INTRAOCULAR LENS PLACEMENT (IOC);  Surgeon: Jaye Fallow, MD;  Location: [facility] ORS;  Service: Ophthalmology;  Laterality: Left;  US  01:07.9 AP% 19.2 CDE 13.02 Fluid pack lot # [id number]   CATARACT EXTRACTION W/PHACO Right 12/09/2016   Procedure: CATARACT EXTRACTION PHACO AND INTRAOCULAR LENS PLACEMENT (IOC);  Surgeon: Jaye Fallow, MD;  Location: [facility] ORS;  Service: Ophthalmology;  Laterality: Right;  US  00:49 AP% 21.2 CDE 10.39 Fluid pack lot # [id number]   CHOLECYSTECTOMY     COLONOSCOPY WITH PROPOFOL  N/A 11/22/2014   Procedure: COLONOSCOPY WITH PROPOFOL ;  Surgeon: Lamar ONEIDA Holmes, MD;  Location: [facility] ENDOSCOPY;  Service: Endoscopy;  Laterality: N/A;   CYSTOSCOPY WITH STENT PLACEMENT Bilateral 11/27/2018   Procedure: CYSTOSCOPY WITH STENT PLACEMENT;  Surgeon: Nieves Cough, MD;  Location: [facility] ORS;  Service: Urology;  Laterality: Bilateral;   CYSTOSCOPY/URETEROSCOPY/HOLMIUM LASER/STENT PLACEMENT  Bilateral 12/17/2018   Procedure: CYSTOSCOPY/URETEROSCOPY/HOLMIUM LASER/STENT Exchange;  Surgeon: Francisca Redell BROCKS, MD;  Location: [facility];  Service: Urology;  Laterality: Bilateral;   ESOPHAGOGASTRODUODENOSCOPY  11/22/2014   Procedure: ESOPHAGOGASTRODUODENOSCOPY (EGD);  Surgeon: Lamar ONEIDA Holmes, MD;  Location[facility] ENDOSCOPY;  Service: Endoscopy;;   ESOPHAGOGASTRODUODENOSCOPY (EGD) WITH PROPOFOL  N/A 03/05/2017   Procedure: ESOPHAGOGASTRODUODENOSCOPY (EGD) WITH PROPOFOL ;  Surgeon: Unk Corinn Skiff, MD;  Location: [facility] END[facility]COPY;  Service: Gastroenterology;  Laterality: N/A;   EXTRACORPOREAL SHOCK WAVE LITHOTRIPSY Right 03/04/2018   Procedure: EXTRACORPOREAL SHOCK WAVE LITHOTRIPSY (ESWL);  Surgeon: Francisca Redell BROCKS, MD;  Location: [facility] ORS;  Service: Urology;  Laterality: Right;   EYE SURGERY  Bilateral 2012   cataract extraction with iol   GALLBLADDER SURGERY  1968   hemi pelvectomy     left side at age 23   HEMIPELVIC Left 1962   LEG SURGERY Left    AMPUTATION d/t cancer at 89 years old   MASTECTOMY, PARTIAL Left 07/27/2017   Procedure: MASTECTOMY PARTIAL;  Surgeon: Dessa Reyes ORN, MD;  Location: [facility] ORS;  Service: General;  Laterality: Left;   MOUTH SURGERY  2019   teeth pulled with a bridge insertion.   fell out 12/16/18   OVARY SURGERY Right    cyst removed   SAVORY DILATION  11/22/2014   Procedure: SAVORY DILATION;  Surgeon: Lamar ONEIDA Holmes, MD;  Location: [facility] ENDOSCOPY;  Service: Endoscopy;;   SENTINEL NODE BIOPSY Left 07/27/2017   Procedure: SENTINEL NODE BIOPSY;  Surgeon: Dessa Reyes ORN, MD;  Location: [facility] ORS;  Service: General;  Laterality: Left;   SKIN CANCER EXCISION     nose and arm and face   TEE WITHOUT CARDIOVERSION N/A 07/25/2019   Procedure: TRANSESOPHAGEAL ECHOCARDIOGRAM (TEE);  Surgeon: Hester Wolm PARAS, MD;  Location: [facility] ORS;  Service: Cardiovascular;  Laterality: N/A;   TONSILLECTOMY      Allergies  Allergen Reactions   Ciprofloxacin  Hives    Codeine Other (See Comments)    HALLUCINATIONS   Tape     Rash and skin irritation/ paper tape and tegaderm OK   Ciprocinonide [Fluocinolone] Other (See Comments)    unknown   Amoxicillin Other (See Comments)    Upset stomach Has patient had a PCN reaction causing immediate rash, facial/tongue/throat swelling, SOB or lightheadedness with hypotension: No Has patient had a PCN reaction causing severe rash involving mucus membranes or skin necrosis: No Has patient had a PCN reaction that required hospitalization: No Has patient had a PCN reaction occurring within the last 10 years: Yes If all of the above answers are NO, then may proceed with Cephalosporin use.;   Cefuroxime  Other (See Comments)    OK INTRACAMERALLY PER DR WLP, upset stomach   Latex Rash    Rast test NEGATIVE   Lipitor [Atorvastatin] Other (See Comments)    unknown    Outpatient Encounter Medications as of 04/04/2024  Medication Sig   acetaminophen  (TYLENOL ) 650 MG CR tablet Take 650 mg by mouth every 8 (eight) hours as needed for pain.   Cholecalciferol  (VITAMIN D -1000 MAX ST) 25 MCG (1000 UT) tablet Take 1,000 Units by mouth daily.   clindamycin (CLEOCIN) 300 MG capsule Take 300 mg by mouth in the morning, at noon, in the evening, and at bedtime. (Patient not taking: Reported on 03/08/2024)   dextromethorphan-guaiFENesin  (ROBITUSSIN-DM) 10-100 MG/5ML liquid Take 10 mLs by mouth every 4 (four) hours as needed for cough.   diclofenac  Sodium (VOLTAREN ) 1 % GEL Apply 4 g topically. Apply to stump topically every 6 hours as needed. Apply to right knee topically three times daily for pain.   fluticasone  (FLONASE ) 50 MCG/ACT nasal spray Place 2 sprays into both nostrils 2 (two) times daily.   gabapentin  (NEURONTIN ) 100 MG capsule Take 100 mg by mouth 3 (three) times daily.   GLIPIZIDE  XL 2.5 MG 24 hr tablet Take 2.5 mg by mouth daily.    hydrocortisone 2.5 % cream Apply to Hemorrhoids/buttucks topically every 8 hours as needed  for hemorrhoids.   ipratropium-albuterol  (DUONEB) 0.5-2.5 (3) MG/3ML SOLN Take 3 mLs by nebulization every 4 (four) hours as needed.   loratadine  (CLARITIN ) 10 MG tablet Take 10 mg by  mouth daily.   LORazepam  (ATIVAN ) 1 MG tablet Take by mouth. (Patient not taking: Reported on 03/08/2024)   melatonin 3 MG TABS tablet Take 3 mg by mouth at bedtime.   metFORMIN  (GLUCOPHAGE ) 1000 MG tablet Take 1,000 mg by mouth 2 (two) times daily.   metoprolol  tartrate (LOPRESSOR ) 25 MG tablet Take 25 mg by mouth 2 (two) times daily.   metroNIDAZOLE  (METROGEL ) 1 % gel Apply topically daily.   ondansetron  (ZOFRAN ) 4 MG tablet Take 4 mg by mouth every 4 (four) hours as needed for nausea or vomiting.   OXYGEN 2lpm as needed for sats less than 88% every 1 hour as needed   pantoprazole  (PROTONIX ) 40 MG tablet Take 40 mg by mouth daily.   PARoxetine  (PAXIL ) 30 MG tablet Take 30 mg by mouth daily.   Polyethyl Glycol-Propyl Glycol 0.4-0.3 % SOLN Apply to eye 2 (two) times daily as needed.   polyethylene glycol (MIRALAX  / GLYCOLAX ) 17 g packet Take 17 g by mouth daily as needed.   PRADAXA  150 MG CAPS capsule TAKE 1 CAPSULE BY MOUTH TWICE DAILY START TREATMENT 10 HRS LAST DOSE OF LOVENOX    sitaGLIPtin (JANUVIA) 100 MG tablet Take 100 mg by mouth daily.   Sodium Fluoride (PREVIDENT 5000 BOOSTER PLUS) 1.1 % PSTE Place onto teeth at bedtime.   SUNSCREEN SPF50 EX Apply topically. Apply to exposed skin topically every 2 hours as needed for Sunburn Prevention   tiZANidine  (ZANAFLEX ) 2 MG tablet Take 2 mg by mouth every 12 (twelve) hours as needed for muscle spasms.   No facility-administered encounter medications on file as of 04/04/2024.    Review of Systems  Constitutional:  Negative for activity change, appetite change, fatigue and unexpected weight change.  HENT:  Negative for congestion and hearing loss.   Eyes: Negative.   Respiratory:  Negative for cough and shortness of breath.   Cardiovascular:  Negative for chest  pain, palpitations and leg swelling.  Gastrointestinal:  Positive for diarrhea. Negative for abdominal pain and constipation.  Genitourinary:  Negative for difficulty urinating and dysuria.  Musculoskeletal:  Negative for arthralgias and myalgias.  Skin:  Negative for color change and wound.  Neurological:  Negative for dizziness and weakness.  Psychiatric/Behavioral:  Negative for agitation, behavioral problems and confusion.      Immunization History  Administered Date(s) Administered   INFLUENZA, HIGH DOSE SEASONAL PF 03/07/2023   Influenza-Unspecified 02/03/2020, 02/18/2021, 02/18/2022, 02/16/2024   Moderna Covid-19 Fall Seasonal Vaccine 60yrs & older 08/12/2022   PNEUMOCOCCAL CONJUGATE-20 01/17/2023   Pneumococcal Conjugate-13 09/28/2017   Tdap 12/31/2022   Unspecified SARS-COV-2 Vaccination 05/20/2019, 05/20/2019, 06/17/2019, 03/16/2020, 09/20/2020, 01/25/2021, 01/30/2023, 03/07/2023, 02/26/2024   Zoster Recombinant(Shingrix) 12/25/2023   Pertinent  Health Maintenance Due  Topic Date Due   HEMOGLOBIN A1C  03/10/2024   Mammogram  04/13/2024   OPHTHALMOLOGY EXAM  11/11/2024   FOOT EXAM  12/24/2024   Influenza Vaccine  Completed   Bone Density Scan  Completed      12/19/2021    7:00 AM 12/19/2021   12:00 PM 01/24/2022   11:20 AM 12/11/2022    1:31 PM 12/24/2023   11:58 AM  Fall Risk  Falls in the past year?    0 0  (RETIRED) Patient Fall Risk Level High fall risk  High fall risk  High fall risk     Patient at Risk for Falls Due to     Impaired balance/gait;Impaired mobility     Data saved with a previous flowsheet row definition  Functional Status Survey:    Vitals:   04/04/24 1231  BP: 124/75  Pulse: 82  Resp: 18  Temp: 98.4 F (36.9 C)  SpO2: 91%  Weight: 140 lb (63.5 kg)   Body mass index is 29.26 kg/m. Wt Readings from Last 3 Encounters:  04/04/24 140 lb (63.5 kg)  03/08/24 144 lb 9.6 oz (65.6 kg)  02/29/24 145 lb 9.6 oz (66 kg)    Physical  Exam Constitutional:      General: She is not in acute distress.    Appearance: She is well-developed. She is not diaphoretic.  HENT:     Head: Normocephalic and atraumatic.     Mouth/Throat:     Pharynx: No oropharyngeal exudate.  Eyes:     Conjunctiva/sclera: Conjunctivae normal.     Pupils: Pupils are equal, round, and reactive to light.  Cardiovascular:     Rate and Rhythm: Normal rate and regular rhythm.     Heart sounds: Normal heart sounds.  Pulmonary:     Effort: Pulmonary effort is normal.     Breath sounds: Normal breath sounds.  Abdominal:     General: Bowel sounds are normal.     Palpations: Abdomen is soft.  Musculoskeletal:     Cervical back: Normal range of motion and neck supple.     Right lower leg: No edema.     Left lower leg: No edema.  Skin:    General: Skin is warm and dry.  Neurological:     Mental Status: She is alert.  Psychiatric:        Mood and Affect: Mood normal.     Labs reviewed: Recent Labs    08/04/23 1011 09/03/23 1116 02/03/24 1015 02/29/24 0000  NA 137 138 136 139  K 4.0 4.1 4.9 4.3  CL 102 104 105 107  CO2 25 24 25  26*  GLUCOSE 191* 175* 171*  --   BUN 14 16 17 12   CREATININE 0.71 0.62 0.59 0.4*  CALCIUM 11.0* 10.2 11.6* 10.4   Recent Labs    02/29/24 0000  AST 16  ALT 13  ALKPHOS 63  ALBUMIN 3.4*   Recent Labs    08/04/23 1011 02/03/24 1015 02/29/24 0000  WBC 10.5 9.5 10.0  NEUTROABS 7.2 6.3 6,740.00  HGB 12.7 12.5 11.3*  HCT 41.4 40.4 36  MCV 85.4 79.8*  --   PLT 241 241 212   Lab Results  Component Value Date   TSH 3.77 04/16/2023   Lab Results  Component Value Date   HGBA1C 7.6 09/08/2023   Lab Results  Component Value Date   CHOL 146 03/07/2024   HDL 29 (A) 03/07/2024   LDLCALC 81 03/07/2024   TRIG 270 (A) 03/07/2024   CHOLHDL 5.8 12/15/2021    Significant Diagnostic Results in last 30 days:  No results found.  Assessment/Plan 1. Garbled speech (Primary) Noted to have garbled speech  on 11/28 by nursing, no other neurological deficits.  Family did not want her sent out- UA was completed and was negative for UTI.  -symptoms resolved within 6 hours.  - CT HEAD WO CONTRAST ( ); to evaluate   2. Type 2 diabetes mellitus with peripheral neuropathy (HCC) -A1c was 5.6 this month. Will stop metformin  due to diarrhea and decrease in oral intake.  Continues on glipizide  and januvia- staff monitoring blood sugars daily  3. Essential hypertension -Blood pressure well controlled, goal bp <140/90 Continue current medications and dietary modifications follow metabolic panel  4. Gastroesophageal reflux  disease without esophagitis -will decrease protonix  to 20 mg daily and monitor for worsening symptoms of GERD  5. Vaginal bleeding Originally thought to have bleeding in urine however noted vaginally- she has appt with GYN on 12/30 Blood counts have been stable.   6. Dental caries She had 20 teeth pulled and now with tenderness noted  She is on a soft diet.  -add low sugar ensure daily Continue supportive care as her gums are healing  7. Anxiety Ongoing, continue paxil  for now- continues to have anxiety per staff.       Torre Pikus K. Caro BODILY [facility] [phone]

## 2024-04-19 ENCOUNTER — Ambulatory Visit
Admission: RE | Admit: 2024-04-19 | Discharge: 2024-04-19 | Attending: Nurse Practitioner | Admitting: Nurse Practitioner

## 2024-04-19 DIAGNOSIS — R4789 Other speech disturbances: Secondary | ICD-10-CM

## 2024-05-03 ENCOUNTER — Ambulatory Visit
Admission: RE | Admit: 2024-05-03 | Discharge: 2024-05-03 | Disposition: A | Discharge status: Home / Self Care | Source: Ambulatory Visit | Attending: Oncology | Admitting: Oncology

## 2024-05-03 DIAGNOSIS — Z853 Personal history of malignant neoplasm of breast: Secondary | ICD-10-CM | POA: Diagnosis present

## 2024-05-03 DIAGNOSIS — Z08 Encounter for follow-up examination after completed treatment for malignant neoplasm: Secondary | ICD-10-CM | POA: Diagnosis present

## 2024-05-03 DIAGNOSIS — Z1231 Encounter for screening mammogram for malignant neoplasm of breast: Secondary | ICD-10-CM | POA: Insufficient documentation

## 2024-05-03 DIAGNOSIS — M810 Age-related osteoporosis without current pathological fracture: Secondary | ICD-10-CM | POA: Diagnosis present

## 2024-05-13 ENCOUNTER — Encounter: Payer: Self-pay | Admitting: Internal Medicine

## 2024-05-13 ENCOUNTER — Non-Acute Institutional Stay (SKILLED_NURSING_FACILITY): Admitting: Internal Medicine

## 2024-05-13 DIAGNOSIS — M810 Age-related osteoporosis without current pathological fracture: Secondary | ICD-10-CM

## 2024-05-13 DIAGNOSIS — I480 Paroxysmal atrial fibrillation: Secondary | ICD-10-CM | POA: Diagnosis not present

## 2024-05-13 DIAGNOSIS — F410 Panic disorder [episodic paroxysmal anxiety] without agoraphobia: Secondary | ICD-10-CM | POA: Diagnosis not present

## 2024-05-13 DIAGNOSIS — Z5181 Encounter for therapeutic drug level monitoring: Secondary | ICD-10-CM

## 2024-05-13 DIAGNOSIS — F331 Major depressive disorder, recurrent, moderate: Secondary | ICD-10-CM

## 2024-05-13 DIAGNOSIS — M1711 Unilateral primary osteoarthritis, right knee: Secondary | ICD-10-CM

## 2024-05-13 DIAGNOSIS — Z7984 Long term (current) use of oral hypoglycemic drugs: Secondary | ICD-10-CM | POA: Diagnosis not present

## 2024-05-13 DIAGNOSIS — E1169 Type 2 diabetes mellitus with other specified complication: Secondary | ICD-10-CM | POA: Diagnosis not present

## 2024-05-13 DIAGNOSIS — I10 Essential (primary) hypertension: Secondary | ICD-10-CM | POA: Diagnosis not present

## 2024-05-13 DIAGNOSIS — D6851 Activated protein C resistance: Secondary | ICD-10-CM | POA: Diagnosis not present

## 2024-05-13 DIAGNOSIS — E1142 Type 2 diabetes mellitus with diabetic polyneuropathy: Secondary | ICD-10-CM | POA: Diagnosis not present

## 2024-05-13 NOTE — Progress Notes (Unsigned)
 [facility] Routine Visit Progress Note    Location:  Other [place].  Nursing Home Room Number: [facility] [id number] Place of Service:  SNF (31)   PCP: Laurence Locus, DO   Patient Care Team: Laurence Locus, DO as PCP - General (Internal Medicine) Dessa, Reyes ORN, MD (General Surgery) Jacobo Evalene PARAS, MD as Consulting Physician (Oncology) [facility], [facility])   Extended Emergency Contact Information Primary Emergency Contact: Kelsie Maryruth FALCON Address: [address]          [place], [place] [address] [place] Home Phone: [phone] Mobile Phone: [phone] Relation: Daughter   Goals of care: Advanced Directive information    02/03/2024   10:48 AM  Advanced Directives  Does Patient Have a Medical Advance Directive? Yes  Type of Advance Directive Out of facility DNR (pink MOST or yellow form)  Copy of Healthcare Power of Attorney in Chart? Yes - validated most recent copy scanned in chart (See row information)  Pre-existing out of facility DNR order (yellow form or pink MOST form) Pink MOST/Yellow Form most recent copy in chart - Physician notified to receive inpatient order    CODE STATUS: Do Not Resuscitate (DNR)   Chief Complaint  Patient presents with   Medical Management of Chronic Issues    Medical Management of Chronic Issues.      HPI: Pt is a 89 y.o. female seen today for medical management of chronic disease.    Past Medical History:  Diagnosis Date   Anxiety    Benign breast lumps    Blood clotting disorder    Bone cancer (HCC)    head of femur, left leg ... 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  Oncotype recurrence score: 1.  DCIS, margin less than 0.5 mm.  Negative sentinel node.  Accelerated partial breast radiation.   Cervicalgia    Chronic kidney disease    kidney stones   Colon cancer  [facility])    patient unaware of this   Complication of anesthesia    mood alteration / not sure if d/t pain medicine or anesthesia as she passed out    Cough    NASAL DRIP / SNEEZING / SORE THROAT MOSTLY CONSTANT   Depression    Diabetes (HCC)    Diverticulitis    Dizzy    DVT (deep venous thrombosis) (HCC) 07/27/2017   GERD (gastroesophageal reflux disease)    H/O blood clots    arm and leg   HBP (high blood pressure)    Hematuria    gross   Hemorrhoids    HLD (hyperlipidemia)    Microscopic hematuria 06/02/2015   Muscle pain    Osteoarthritis    Osteoporosis    Panic disorder    Personal history of radiation therapy    PND (post-nasal drip)    CHRONIC WITH SORE THROAT AND SNEEZING   Reflux    Sepsis (HCC)    Sepsis due to pneumonia (HCC) 03/20/2023   Sepsis, unspecified organism (HCC) 12/15/2021   Formatting of this note might be different from the original.  Last Assessment & Plan: Formatting of this note might be different from the original. Appears to be multifactorial and secondary to healthcare associated pneumonia as well as a UTI CT angio shows asymmetric airspace disease at the left base and posteriorly in the right upper lobe  concerning for pneumonia Patient also has pyuria Patient   Severe sepsis (HCC) 12/15/2021   Skin cancer    nose   Swelling    Tremor    Tremors of nervous system    Ureteral stone with hydronephrosis 11/26/2018   Past Surgical History:  Procedure Laterality Date   APPENDECTOMY  1962   BREAST BIOPSY Right 2006?   benign   BREAST BIOPSY Left 2019   invasive mammary carcinoma   BREAST CYST EXCISION Left 07/27/2017   Procedure: SKIN CYST EXCISED;  Surgeon: Dessa Reyes ORN, MD;  Location: [facility] ORS;  Service: General;  Laterality: Left;   BREAST LUMPECTOMY Left 07/2017   invasive mammary, DCIS  mammosite   BREAST SURGERY Right 2019   CARPAL TUNNEL RELEASE Right    CATARACT EXTRACTION W/PHACO Left 11/11/2016   Procedure: CATARACT EXTRACTION  PHACO AND INTRAOCULAR LENS PLACEMENT (IOC);  Surgeon: Jaye Fallow, MD;  Location: [facility] ORS;  Service: Ophthalmology;  Laterality: Left;  US  01:07.9 AP% 19.2 CDE 13.02 Fluid pack lot # [id number]   CATARACT EXTRACTION W/PHACO Right 12/09/2016   Procedure: CATARACT EXTRACTION PHACO AND INTRAOCULAR LENS PLACEMENT (IOC);  Surgeon: Jaye Fallow, MD;  Location: [facility] ORS;  Service: Ophthalmology;  Laterality: Right;  US  00:49 AP% 21.2 CDE 10.39 Fluid pack lot # [id number]   CHOLECYSTECTOMY     COLONOSCOPY WITH PROPOFOL  N/A 11/22/2014   Procedure: COLONOSCOPY WITH PROPOFOL ;  Surgeon: Lamar ONEIDA Holmes, MD;  Location: [facility] ENDOSCOPY;  Service: Endoscopy;  Laterality: N/A;   CYSTOSCOPY WITH STENT PLACEMENT Bilateral 11/27/2018   Procedure: CYSTOSCOPY WITH STENT PLACEMENT;  Surgeon: Nieves Cough, MD;  Location: [facility] ORS;  Service: Urology;  Laterality: Bilateral;   CYSTOSCOPY/URETEROSCOPY/HOLMIUM LASER/STENT PLACEMENT Bilateral 12/17/2018   Procedure: CYSTOSCOPY/URETEROSCOPY/HOLMIUM LASER/STENT Exchange;  Surgeon: Francisca Redell BROCKS, MD;  Location: [facility] ORS;  Service: Urology;  Laterality: Bilateral;   ESOPHAGOGASTRODUODENOSCOPY  11/22/2014   Procedure: ESOPHAGOGASTRODUODENOSCOPY (EGD);  Surgeon: Lamar ONEIDA Holmes, MD;  Location: [facility] ENDOSCOPY;  Service: Endoscopy;;   ESOPHAGOGASTRODUODENOSCOPY (EGD) WITH PROPOFOL  N/A 03/05/2017   Procedure: ESOPHAGOGASTRODUODENOSCOPY (EGD) WITH PROPOFOL ;  Surgeon: Unk Corinn Skiff, MD;  Location: [facility] ENDOSCOPY;  Service: Gastroenterology;  Laterality: N/A;   EXTRACORPOREAL SHOCK WAVE LITHOTRIPSY Right 03/04/2018   Procedure: EXTRACORPOREAL SHOCK WAVE LITHOTRIPSY (ESWL);  Surgeon: Francisca Redell BROCKS, MD;  Location: [facility] ORS;  Service: Urology;  Laterality: Right;   EYE SURGERY Bilateral 2012   cataract extraction with iol   GALLBLADDER SURGERY  1968   hemi pelvectomy     left side at age 66   HEMIPELVIC Left 1962   LEG SURGERY Left    AMPUTATION d/t cancer at  89 years old   MASTECTOMY, PARTIAL Left 07/27/2017   Procedure: MASTECTOMY PARTIAL;  Surgeon: Dessa Reyes ORN, MD;  Location: [facility] ORS;  Service: General;  Laterality: Left;   MOUTH SURGERY  2019   teeth pulled with a bridge insertion.   fell out 12/16/18   OVARY SURGERY Right    cyst removed   SAVORY DILATION  11/22/2014   Procedure: SAVORY DILATION;  Surgeon: Lamar ONEIDA Holmes, MD;  Location[facility] ENDOSCOPY;  Service: Endoscopy;;   SENTINEL NODE BIOPSY Left 07/27/2017   Procedure: SENTINEL NODE BIOPSY;  Surgeon: Dessa Reyes ORN, MD;  Location: [facility] ORS;  Service: General;  Laterality: Left;   SKIN CANCER EXCISION     nose and arm and face   TEE WITHOUT CARDIOVERSION N/A 07/25/2019   Procedure: TRANSESOPHAGEAL ECHOCARDIOGRAM (TEE);  Surgeon: Hester Wolm PARAS, MD;  Location: [facility]  ORS;  Service: Cardiovascular;  Laterality: N/A;   TONSILLECTOMY       Allergies[1]   Outpatient Encounter Medications as of 05/13/2024  Medication Sig   acetaminophen  (TYLENOL ) 325 MG tablet Take 650 mg by mouth every 4 (four) hours as needed.   acetaminophen  (TYLENOL ) 650 MG CR tablet Take 650 mg by mouth every 8 (eight) hours as needed for pain.   Cholecalciferol  (VITAMIN D -1000 MAX ST) 25 MCG (1000 UT) tablet Take 1,000 Units by mouth daily.   dextromethorphan-guaiFENesin  (ROBITUSSIN-DM) 10-100 MG/5ML liquid Take 10 mLs by mouth every 4 (four) hours as needed for cough.   diclofenac  Sodium (VOLTAREN ) 1 % GEL Apply 4 g topically. Apply to stump topically every 6 hours as needed. Apply to right knee topically three times daily for pain.   fluticasone  (FLONASE ) 50 MCG/ACT nasal spray Place 2 sprays into both nostrils 2 (two) times daily.   gabapentin  (NEURONTIN ) 100 MG capsule Take 100 mg by mouth 3 (three) times daily.   GLIPIZIDE  XL 2.5 MG 24 hr tablet Take 2.5 mg by mouth daily.    hydrocortisone 2.5 % cream Apply to Hemorrhoids/buttucks topically every 8 hours as needed for hemorrhoids.   metoprolol   tartrate (LOPRESSOR ) 25 MG tablet Take 25 mg by mouth 2 (two) times daily.   ondansetron  (ZOFRAN ) 4 MG tablet Take 4 mg by mouth every 4 (four) hours as needed for nausea or vomiting.   OXYGEN 2lpm as needed for sats less than 88% every 1 hour as needed   pantoprazole  (PROTONIX ) 40 MG tablet Take 40 mg by mouth daily.   PARoxetine  (PAXIL ) 30 MG tablet Take 30 mg by mouth daily.   Polyethyl Glycol-Propyl Glycol 0.4-0.3 % SOLN Apply to eye 2 (two) times daily as needed.   polyethylene glycol (MIRALAX  / GLYCOLAX ) 17 g packet Take 17 g by mouth daily as needed.   PRADAXA  150 MG CAPS capsule TAKE 1 CAPSULE BY MOUTH TWICE DAILY START TREATMENT 10 HRS LAST DOSE OF LOVENOX    sitaGLIPtin (JANUVIA) 100 MG tablet Take 100 mg by mouth daily.   Sodium Fluoride (PREVIDENT 5000 BOOSTER PLUS) 1.1 % PSTE Place onto teeth at bedtime.   SUNSCREEN SPF50 EX Apply topically. Apply to exposed skin topically every 2 hours as needed for Sunburn Prevention   ipratropium-albuterol  (DUONEB) 0.5-2.5 (3) MG/3ML SOLN Take 3 mLs by nebulization every 4 (four) hours as needed. (Patient not taking: Reported on 05/13/2024)   loratadine  (CLARITIN ) 10 MG tablet Take 10 mg by mouth daily. (Patient not taking: Reported on 05/13/2024)   melatonin 3 MG TABS tablet Take 3 mg by mouth at bedtime. (Patient not taking: Reported on 05/13/2024)   metFORMIN  (GLUCOPHAGE ) 1000 MG tablet Take 1,000 mg by mouth 2 (two) times daily. (Patient not taking: Reported on 05/13/2024)   metroNIDAZOLE  (METROGEL ) 1 % gel Apply topically daily. (Patient not taking: Reported on 05/13/2024)   tiZANidine  (ZANAFLEX ) 2 MG tablet Take 2 mg by mouth every 12 (twelve) hours as needed for muscle spasms. (Patient not taking: Reported on 05/13/2024)   No facility-administered encounter medications on file as of 05/13/2024.     Review of Systems ***   Immunization History  Administered Date(s) Administered   INFLUENZA, HIGH DOSE SEASONAL PF 03/07/2023   Influenza-Unspecified  02/03/2020, 02/18/2021, 02/18/2022, 02/16/2024   Moderna Covid-19 Fall Seasonal Vaccine 30yrs & older 08/12/2022   PNEUMOCOCCAL CONJUGATE-20 01/17/2023   Pneumococcal Conjugate-13 09/28/2017   Tdap 12/31/2022   Unspecified SARS-COV-2 Vaccination 05/20/2019, 05/20/2019, 06/17/2019, 03/16/2020, 09/20/2020, 01/25/2021, 01/30/2023,  03/07/2023, 02/26/2024   Zoster Recombinant(Shingrix) 12/25/2023   Pertinent  Health Maintenance Due  Topic Date Due   HEMOGLOBIN A1C  03/10/2024   OPHTHALMOLOGY EXAM  11/11/2024   FOOT EXAM  12/24/2024   Mammogram  05/03/2025   Influenza Vaccine  Completed   Bone Density Scan  Completed      12/19/2021    7:00 AM 12/19/2021   12:00 PM 01/24/2022   11:20 AM 12/11/2022    1:31 PM 12/24/2023   11:58 AM  Fall Risk  Falls in the past year?    0 0  (RETIRED) Patient Fall Risk Level High fall risk  High fall risk  High fall risk     Patient at Risk for Falls Due to     Impaired balance/gait;Impaired mobility     Data saved with a previous flowsheet row definition   Functional Status Survey:     Vitals:   05/13/24 1451  BP: 115/68  Pulse: 69  Resp: 18  Temp: 97.8 F (36.6 C)  SpO2: 96%  Weight: 137 lb (62.1 kg)  Height: 4' 10 (1.473 m)   Body mass index is 28.63 kg/m. Physical Exam***   Labs reviewed: Recent Labs    08/04/23 1011 09/03/23 1116 02/03/24 1015 02/29/24 0000  NA 137 138 136 139  K 4.0 4.1 4.9 4.3  CL 102 104 105 107  CO2 25 24 25  26*  GLUCOSE 191* 175* 171*  --   BUN 14 16 17 12   CREATININE 0.71 0.62 0.59 0.4*  CALCIUM 11.0* 10.2 11.6* 10.4   Recent Labs    02/29/24 0000  AST 16  ALT 13  ALKPHOS 63  ALBUMIN 3.4*   Recent Labs    08/04/23 1011 02/03/24 1015 02/29/24 0000  WBC 10.5 9.5 10.0  NEUTROABS 7.2 6.3 6,740.00  HGB 12.7 12.5 11.3*  HCT 41.4 40.4 36  MCV 85.4 79.8*  --   PLT 241 241 212   Lab Results  Component Value Date   TSH 0.01 (A) 03/21/2024   Lab Results  Component Value Date   HGBA1C 7.6  09/08/2023   Lab Results  Component Value Date   CHOL 146 03/07/2024   HDL 29 (A) 03/07/2024   LDLCALC 81 03/07/2024   TRIG 270 (A) 03/07/2024   CHOLHDL 5.8 12/15/2021     Significant Diagnostic Results in last 30 days: MM 3D SCREENING MAMMOGRAM BILATERAL BREAST Result Date: 05/06/2024 CLINICAL DATA:  Screening. EXAM: DIGITAL SCREENING BILATERAL MAMMOGRAM WITH TOMOSYNTHESIS AND CAD TECHNIQUE: Bilateral screening digital craniocaudal and mediolateral oblique mammograms were obtained. Bilateral screening digital breast tomosynthesis was performed. The images were evaluated with computer-aided detection. COMPARISON:  Previous exam(s). ACR Breast Density Category b: There are scattered areas of fibroglandular density. FINDINGS: There are no findings suspicious for malignancy. IMPRESSION: No mammographic evidence of malignancy. A result letter of this screening mammogram will be mailed directly to the patient. RECOMMENDATION: Screening mammogram in one year. (Code:[id number]-B-01Y) BI-RADS CATEGORY  1: Negative. Electronically Signed   By: Dina  Arceo M.D.   On: 05/06/2024 08:21   DG Bone Density Result Date: 05/03/2024 EXAM: DUAL X-RAY ABSORPTIOMETRY (DXA) FOR BONE MINERAL DENSITY 05/03/2024 12:00 pm CLINICAL DATA:  89 year old Female Postmenopausal. Osteoporosis Patient is or has been on bone building therapies. TECHNIQUE: An axial (e.g., hips, spine) and/or appendicular (e.g., radius) exam was performed, as appropriate, using GE Secretary/administrator at [facility]. Images are obtained for bone mineral density measurement and are not obtained  for diagnostic purposes. [id number] Exclusions: Left hip. COMPARISON:  04/14/2023. FINDINGS: Scan quality: Good. LUMBAR SPINE (L1-L4): BMD (in g/cm2): 1.080 T-score: -0.9 Z-score: 1.0 Rate of change from previous exam: 16.5 % RIGHT FEMORAL NECK: BMD (in g/cm2): 0.703 T-score: -2.4 Z-score: 0.1 RIGHT TOTAL HIP: BMD (in g/cm2): 0.687 T-score: -2.5  Z-score: -0.1 Rate of change from previous exam: No significant rate of change from previous exam. LEFT FOREARM (RADIUS 33%): BMD (in g/cm2): 0.515 T-score: -4.1 Z-score: -0.7 Rate of change from previous exam: -8.7 % FRAX 10-YEAR PROBABILITY OF FRACTURE: FRAX not reported as the lowest BMD is not in the osteopenia range. IMPRESSION: Osteoporosis based on BMD. Fracture risk is unknown due to history of bone building therapy. RECOMMENDATIONS: 1. All patients should optimize calcium and vitamin D  intake. 2. Consider FDA-approved medical therapies in postmenopausal women and men aged 70 years and older, based on the following: - A hip or vertebral (clinical or morphometric) fracture - T-score less than or equal to -2.5 and secondary causes have been excluded. - Low bone mass (T-score between -1.0 and -2.5) and a 10-year probability of a hip fracture greater than or equal to 3% or a 10-year probability of a major osteoporosis-related fracture greater than or equal to 20% based on the [place] -adapted WHO algorithm. - Clinician judgment and/or patient preferences Cathan Gearin indicate treatment for people with 10-year fracture probabilities above or below these levels 3. Patients with diagnosis of osteoporosis or at high risk for fracture should have regular bone mineral density tests. For patients eligible for Medicare, routine testing is allowed once every 2 years. The testing frequency can be increased to one year for patients who have rapidly progressing disease, those who are receiving or discontinuing medical therapy to restore bone mass, or have additional risk factors. Electronically Signed   By: Harrietta Sherry M.D.   On: 05/03/2024 12:42   CT HEAD WO CONTRAST ( ) Result Date: 04/20/2024 EXAM: CT HEAD WITHOUT 04/19/2024 11:54:23 AM TECHNIQUE: CT of the head was performed without the administration of intravenous contrast. Automated exposure control, iterative reconstruction, and/or weight based adjustment of the mA/kV  was utilized to reduce the radiation dose to as low as reasonably achievable. COMPARISON: Head CT 03/19/2023. CLINICAL HISTORY: 89 year old female with garbled speech. FINDINGS: BRAIN AND VENTRICLES: No acute intracranial hemorrhage. No mass effect or midline shift. No extra-axial fluid collection. No evidence of acute infarct. No hydrocephalus. Brain volume stable from last year. Small chronic right cerebellar infarcts appear stable (coronal images 47 and 51). Gray white differentiation is stable, with otherwise mild for age chronic periventricular white matter hypodensity. Calcified atherosclerosis at the skull base. No suspicious intracranial vascular hyperdensity. ORBITS: No acute abnormality. SINUSES AND MASTOIDS: Visible paranasal sinuses, tympanic cavities and mastoids are well aerated. SOFT TISSUES AND SKULL: No acute skull fracture. No acute soft tissue abnormality. IMPRESSION: 1. No acute intracranial abnormality. 2. 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 Electronically signed by: Helayne Hurst MD 04/20/2024 09:14 AM EST RP Workstation: [id number]     Assessment/Plan No problem-specific Assessment & Plan notes found for this encounter.    No orders of the defined types were placed in this encounter.  There are no discontinued medications. Orders Placed This Encounter  Procedures   TSH    This external order was created through the Results Console.    [facility] [phone]     [1]  Allergies Allergen Reactions   Ciprofloxacin   Hives   Codeine Other (See Comments)    HALLUCINATIONS   Tape     Rash and skin irritation/ paper tape and tegaderm OK   Ciprocinonide [Fluocinolone] Other (See Comments)    unknown   Amoxicillin Other (See Comments)    Upset stomach Has patient had a PCN reaction causing immediate rash, facial/tongue/throat swelling, SOB or lightheadedness with  hypotension: No Has patient had a PCN reaction causing severe rash involving mucus membranes or skin necrosis: No Has patient had a PCN reaction that required hospitalization: No Has patient had a PCN reaction occurring within the last 10 years: Yes If all of the above answers are NO, then Claiborne Stroble proceed with Cephalosporin use.;   Cefuroxime  Other (See Comments)    OK INTRACAMERALLY PER DR WLP, upset stomach   Latex Rash    Rast test NEGATIVE   Lipitor [Atorvastatin] Other (See Comments)    unknown

## 2024-05-14 ENCOUNTER — Encounter: Payer: Self-pay | Admitting: Internal Medicine

## 2024-05-14 NOTE — Assessment & Plan Note (Signed)
 Stable.  She receives Prolia  per heme-onc.

## 2024-05-14 NOTE — Assessment & Plan Note (Signed)
 Continue on Pradaxa  150 mg p.o. twice daily.  She is followed by heme-onc.

## 2024-05-14 NOTE — Assessment & Plan Note (Signed)
 She remains on Lopressor  25 mg twice daily.

## 2024-05-14 NOTE — Assessment & Plan Note (Signed)
 Continue with glipizide  2.5 mg daily, Januvia 100 mg daily.  Last A1c in March 10, 2024 showed a hemoglobin A1c of 5.7%.  Indicates excellent outpatient control.  No need for increase in diabetic management.

## 2024-05-14 NOTE — Assessment & Plan Note (Signed)
 Patient has chronic arthritis of the right knee.  There is no joint effusion.  She is not ambulatory.

## 2024-05-14 NOTE — Assessment & Plan Note (Signed)
 Stable.  Patient mains on Pradaxa  150 mg twice daily for CVA prophylaxis.  Continue on Lopressor  25 mg twice daily.

## 2024-05-14 NOTE — Assessment & Plan Note (Signed)
 05/14/2024(1st quarter 2026) patient not agreeable to reduction in Paxil  as she still has quite a bit of anxiety and depression related to her recently removed teeth and need for ongoing dental management.  03/08/2024-discussed dose reduction of paxil  however she is having a lot of anxiety due to upcoming dental procedures and request to defer for another month until after dental work

## 2024-05-14 NOTE — Assessment & Plan Note (Signed)
 Patient has ongoing symptoms of depression and anxiety given her recent dental work and continued need for further dental work.  She is not willing to undergo any dose reductions in her psychiatric medications.  I agree with this given her ongoing mental health symptoms of anxiety and depression given her worsening health status.  Continue on Paxil  30 mg daily.

## 2024-05-14 NOTE — Assessment & Plan Note (Signed)
 Patient currently still having psychiatric symptoms including anxiety due to ongoing dental issues..  Continue on Paxil  30 mg a day.  Gradual dose reduction is not appropriate at this time.  Dose reduction would put her at increased risk for mental health decompensation.

## 2024-08-03 ENCOUNTER — Other Ambulatory Visit

## 2024-08-03 ENCOUNTER — Ambulatory Visit: Admitting: Oncology

## 2024-08-03 ENCOUNTER — Ambulatory Visit

## 2024-08-09 ENCOUNTER — Inpatient Hospital Stay

## 2024-08-09 ENCOUNTER — Inpatient Hospital Stay: Admitting: Oncology

## 2025-01-18 ENCOUNTER — Encounter (INDEPENDENT_AMBULATORY_CARE_PROVIDER_SITE_OTHER)

## 2025-01-18 ENCOUNTER — Ambulatory Visit (INDEPENDENT_AMBULATORY_CARE_PROVIDER_SITE_OTHER): Admitting: Nurse Practitioner
